# Patient Record
Sex: Male | Born: 1940 | Race: White | Hispanic: No | Marital: Married | Smoking: Former smoker
Health system: Southern US, Community
[De-identification: ages and names within clinical notes are randomized; demographics above are authoritative.]

## PROBLEM LIST (undated history)

## (undated) DIAGNOSIS — I272 Pulmonary hypertension, unspecified: Secondary | ICD-10-CM

## (undated) DIAGNOSIS — K829 Disease of gallbladder, unspecified: Secondary | ICD-10-CM

## (undated) DIAGNOSIS — H3530 Unspecified macular degeneration: Secondary | ICD-10-CM

## (undated) DIAGNOSIS — K449 Diaphragmatic hernia without obstruction or gangrene: Secondary | ICD-10-CM

## (undated) DIAGNOSIS — R413 Other amnesia: Secondary | ICD-10-CM

## (undated) DIAGNOSIS — E785 Hyperlipidemia, unspecified: Secondary | ICD-10-CM

## (undated) DIAGNOSIS — N400 Enlarged prostate without lower urinary tract symptoms: Secondary | ICD-10-CM

## (undated) DIAGNOSIS — K219 Gastro-esophageal reflux disease without esophagitis: Secondary | ICD-10-CM

## (undated) DIAGNOSIS — C4491 Basal cell carcinoma of skin, unspecified: Secondary | ICD-10-CM

## (undated) DIAGNOSIS — G459 Transient cerebral ischemic attack, unspecified: Secondary | ICD-10-CM

## (undated) HISTORY — DX: Basal cell carcinoma of skin, unspecified: C44.91

## (undated) HISTORY — DX: Transient cerebral ischemic attack, unspecified: G45.9

## (undated) HISTORY — PX: CHOLECYSTECTOMY: SHX55

## (undated) HISTORY — PX: OTHER SURGICAL HISTORY: SHX169

## (undated) HISTORY — DX: Gastro-esophageal reflux disease without esophagitis: K21.9

## (undated) HISTORY — DX: Other amnesia: R41.3

## (undated) HISTORY — DX: Pulmonary hypertension, unspecified: I27.20

## (undated) HISTORY — DX: Disease of gallbladder, unspecified: K82.9

## (undated) HISTORY — DX: Hyperlipidemia, unspecified: E78.5

## (undated) HISTORY — DX: Diaphragmatic hernia without obstruction or gangrene: K44.9

## (undated) HISTORY — DX: Benign prostatic hyperplasia without lower urinary tract symptoms: N40.0

---

## 1998-03-11 ENCOUNTER — Ambulatory Visit (HOSPITAL_COMMUNITY): Admission: RE | Admit: 1998-03-11 | Discharge: 1998-03-11 | Payer: Self-pay | Admitting: Gastroenterology

## 1998-05-01 ENCOUNTER — Ambulatory Visit (HOSPITAL_COMMUNITY): Admission: RE | Admit: 1998-05-01 | Discharge: 1998-05-01 | Payer: Self-pay | Admitting: *Deleted

## 1998-05-14 ENCOUNTER — Inpatient Hospital Stay (HOSPITAL_COMMUNITY): Admission: RE | Admit: 1998-05-14 | Discharge: 1998-05-15 | Payer: Self-pay | Admitting: *Deleted

## 1999-06-05 ENCOUNTER — Emergency Department (HOSPITAL_COMMUNITY): Admission: EM | Admit: 1999-06-05 | Discharge: 1999-06-05 | Payer: Self-pay

## 1999-06-05 ENCOUNTER — Encounter: Payer: Self-pay | Admitting: Emergency Medicine

## 2000-10-08 ENCOUNTER — Ambulatory Visit (HOSPITAL_COMMUNITY): Admission: RE | Admit: 2000-10-08 | Discharge: 2000-10-08 | Payer: Self-pay | Admitting: Gastroenterology

## 2002-05-27 ENCOUNTER — Emergency Department (HOSPITAL_COMMUNITY): Admission: EM | Admit: 2002-05-27 | Discharge: 2002-05-27 | Payer: Self-pay | Admitting: Emergency Medicine

## 2002-05-27 ENCOUNTER — Encounter: Payer: Self-pay | Admitting: Emergency Medicine

## 2002-11-10 ENCOUNTER — Ambulatory Visit (HOSPITAL_COMMUNITY): Admission: RE | Admit: 2002-11-10 | Discharge: 2002-11-10 | Payer: Self-pay | Admitting: Gastroenterology

## 2003-06-29 ENCOUNTER — Ambulatory Visit: Admission: RE | Admit: 2003-06-29 | Discharge: 2003-06-29 | Payer: Self-pay | Admitting: Internal Medicine

## 2004-10-20 ENCOUNTER — Ambulatory Visit (HOSPITAL_COMMUNITY): Admission: RE | Admit: 2004-10-20 | Discharge: 2004-10-20 | Payer: Self-pay | Admitting: Gastroenterology

## 2009-10-24 DIAGNOSIS — J309 Allergic rhinitis, unspecified: Secondary | ICD-10-CM

## 2009-10-24 DIAGNOSIS — K2270 Barrett's esophagus without dysplasia: Secondary | ICD-10-CM | POA: Insufficient documentation

## 2009-10-24 DIAGNOSIS — F09 Unspecified mental disorder due to known physiological condition: Secondary | ICD-10-CM

## 2009-10-24 DIAGNOSIS — N401 Enlarged prostate with lower urinary tract symptoms: Secondary | ICD-10-CM

## 2009-10-24 DIAGNOSIS — N400 Enlarged prostate without lower urinary tract symptoms: Secondary | ICD-10-CM | POA: Insufficient documentation

## 2009-10-24 DIAGNOSIS — Z8673 Personal history of transient ischemic attack (TIA), and cerebral infarction without residual deficits: Secondary | ICD-10-CM | POA: Insufficient documentation

## 2009-10-24 DIAGNOSIS — G459 Transient cerebral ischemic attack, unspecified: Secondary | ICD-10-CM

## 2009-10-24 DIAGNOSIS — E785 Hyperlipidemia, unspecified: Secondary | ICD-10-CM | POA: Insufficient documentation

## 2009-10-24 HISTORY — DX: Unspecified mental disorder due to known physiological condition: F09

## 2009-10-24 HISTORY — DX: Hyperlipidemia, unspecified: E78.5

## 2009-10-24 HISTORY — DX: Benign prostatic hyperplasia with lower urinary tract symptoms: N40.1

## 2009-10-24 HISTORY — DX: Transient cerebral ischemic attack, unspecified: G45.9

## 2009-10-24 HISTORY — DX: Personal history of transient ischemic attack (TIA), and cerebral infarction without residual deficits: Z86.73

## 2009-10-24 HISTORY — DX: Allergic rhinitis, unspecified: J30.9

## 2011-05-21 ENCOUNTER — Other Ambulatory Visit: Payer: Self-pay | Admitting: Gastroenterology

## 2012-05-10 DIAGNOSIS — G4700 Insomnia, unspecified: Secondary | ICD-10-CM | POA: Insufficient documentation

## 2013-01-20 ENCOUNTER — Other Ambulatory Visit: Payer: Self-pay | Admitting: Gastroenterology

## 2013-02-06 ENCOUNTER — Other Ambulatory Visit: Payer: Self-pay

## 2013-02-06 MED ORDER — DONEPEZIL HCL 10 MG PO TABS: 10.0000 mg | ORAL_TABLET | Freq: Every day | ORAL | Status: DC

## 2013-02-06 NOTE — Telephone Encounter (Signed)
Patient called, left message.  Stated he has changed his local pharmacy to [facility] on [address] and now gets 90 day Rx's through [employer] order.  He would like a refill on Aricept sent to mail order for 3 months and a small supply sent to the local pharmacy while he waits for meds to arrive in the mail.  Rx's have been sent.

## 2013-02-08 ENCOUNTER — Other Ambulatory Visit: Payer: Self-pay | Admitting: Neurology

## 2013-05-02 ENCOUNTER — Ambulatory Visit: Payer: Self-pay | Admitting: Nurse Practitioner

## 2013-05-03 ENCOUNTER — Encounter: Payer: Self-pay | Admitting: Nurse Practitioner

## 2013-05-09 ENCOUNTER — Encounter: Payer: Self-pay | Admitting: Nurse Practitioner

## 2013-05-09 ENCOUNTER — Ambulatory Visit (INDEPENDENT_AMBULATORY_CARE_PROVIDER_SITE_OTHER): Payer: Medicare Other | Admitting: Nurse Practitioner

## 2013-05-09 VITALS — BP 124/77 | HR 50 | Ht 65.0 in | Wt 161.0 lb

## 2013-05-09 DIAGNOSIS — R413 Other amnesia: Secondary | ICD-10-CM

## 2013-05-09 DIAGNOSIS — F411 Generalized anxiety disorder: Secondary | ICD-10-CM

## 2013-05-09 DIAGNOSIS — G4700 Insomnia, unspecified: Secondary | ICD-10-CM

## 2013-05-09 HISTORY — DX: Other amnesia: R41.3

## 2013-05-09 HISTORY — DX: Insomnia, unspecified: G47.00

## 2013-05-09 MED ORDER — TRAZODONE HCL 100 MG PO TABS: ORAL_TABLET | ORAL | Status: DC

## 2013-05-09 MED ORDER — DONEPEZIL HCL 10 MG PO TABS: 10.0000 mg | ORAL_TABLET | Freq: Every day | ORAL | Status: DC

## 2013-05-09 MED ORDER — ZALEPLON 10 MG PO CAPS: 10.0000 mg | ORAL_CAPSULE | Freq: Every day | ORAL | Status: DC

## 2013-05-09 NOTE — Progress Notes (Signed)
I have read the note, and I agree with the clinical assessment and plan.  Frank Meyer,Frank Meyer   

## 2013-05-09 NOTE — Progress Notes (Signed)
[facility]  PATIENT: Frank Meyer DOB: 1941/01/22   REASON FOR VISIT: follow up HISTORY FROM: patient  HISTORY OF PRESENT ILLNESS: Chief Complaint: Memory disturbance  HPI: Frank Meyer is a 72 year old right-handed white male with a history of minimum cognitive impairment. The patient has not had a significant decline in his memory issues since last seen, but he does indicate that he has stopped doing a lot of household repairs that he used to feel comfortable with. The patient is still doing the finances, driving a car, and doing general housework. The patient reports ongoing problems with sleeping, and oftentimes his mind will race at night, and he will wake up once every hour. The patient takes Sonata on occasion. The patient returns for an evaluation.  UPDATE 05/09/13 (LL):  Frank Meyer returns for annual follow up for mild memory loss.  He states that he is doing well, still exercising at the [place] several days a weeks and golfs twice a week.  States that he averages 6 hours of sleep per night. He takes a sleep aid about every three nights because his mind races about what he has to do the next day.  He does not feel drowsy in the mornings when he takes it. He states that the Trazodone has helped his quality of sleep and his mood, he notices that he is not as irritable with his wife as he was before.  They are getting along much better.  REVIEW OF SYSTEMS: Full 14 system review of systems performed and notable only for:  Constitutional: N/A  Cardiovascular: N/A  Ear/Nose/Throat: N/A  Skin: N/A  Eyes: N/A  Respiratory: N/A  Gastroitestinal: N/A  Genitourinary: N/A Hematology/Lymphatic: N/A  Endocrine: N/A Musculoskeletal:N/A  Allergy/Immunology: N/A  Neurological: N/A Psychiatric: N/A Sleep: N/A   ALLERGIES: No Known Allergies  HOME MEDICATIONS: Outpatient Prescriptions Prior to Visit  Medication Sig Dispense Refill  . traZODone (DESYREL) 100 MG  tablet Take 1 tablet by mouth at  night  90 tablet  1  . donepezil (ARICEPT) 10 MG tablet Take 1 tablet (10 mg total) by mouth daily.  90 tablet  1   No facility-administered medications prior to visit.    PAST MEDICAL HISTORY: Past Medical History  Diagnosis Date  . Mild memory disturbance   . TIA (transient ischemic attack)   . Hiatal hernia   . Gallbladder problem   . Gastroesophageal reflux disease   . Benign prostatic hypertrophy   . Dyslipidemia   . Basal cell carcinoma     PAST SURGICAL HISTORY: Past Surgical History  Procedure Laterality Date  . None      FAMILY HISTORY: Family History  Problem Relation Age of Onset  . Obesity Mother   . Memory loss Mother     SOCIAL HISTORY: History   Social History  . Marital Status: Married    Spouse Name: N/A    Number of Children: 2  . Years of Education: college   Occupational History  . retired    Social History Main Topics  . Smoking status: Former Smoker    Quit date: 05/04/1979  . Smokeless tobacco: Not on file  . Alcohol Use: Yes  . Drug Use: No  . Sexual Activity: Not on file   Other Topics Concern  . Not on file   Social History Narrative   Pt is retired. He is married. The pt lives with his wife. He has a Chiropodist. The pt has 2 children.  PHYSICAL EXAM  Filed Vitals:   05/09/13 1109  BP: 124/77  Pulse: 50  Height: 5\' 5"  (1.651 m)  Weight: 161 lb (73.029 kg)   Body mass index is 26.79 kg/(m^2).  Generalized: Well developed, athletic build, in no acute distress  Head: normocephalic and atraumatic. Oropharynx benign  Neck: Supple, no carotid bruits  Cardiac: Regular rate rhythm, no murmur  Musculoskeletal: No deformity   Neurologic Exam  Mental Status: Alert and Oriented x 2.  Mini-Mental status examination done today shows score of 28 out of 30 with deficits in date and day of week. The patient was able to name 13 animals in 60 seconds (normal). Clock drawing 4/4. Cranial  Nerves: Facial symmetry is present.  Speech is normal, no aphasia or dysarthria is noted.  Extraocular movements are full.  Visual fields are full.  Symmetric shoulder shrug. Motor: Patient has good strength in all 4 extremities. No drift is seen.   Coordination: Patient has good finger-to-nose and heel-to-shin bilaterally. Some apraxia with the use of the extremities is noted. Gait and Station: Patient has a normal gait.  Tandem gait is normal.  Romberg is negative.  Reflexes: Deep tendon reflexes are symmetric. moderate stride, good arm swing, smooth turning, able to perform tiptoe, and heel walking without difficulty.   DIAGNOSTIC DATA (LABS, IMAGING, TESTING) - I reviewed patient records, labs, notes, testing and imaging myself where available.  ASSESSMENT AND PLAN 72 y.o. year old Caucasian male has a past medical history of Mild memory disturbance; TIA (transient ischemic attack); Hiatal hernia; Gallbladder problem; Gastroesophageal reflux disease; Benign prostatic hypertrophy; Dyslipidemia; and Basal cell carcinoma here for follow up of memory loss. The patient continues to score well on the Mini-Mental status examination, 28/30.    PLAN: Continue Aricept for memory loss.. Continue Trazodone for sleep and mood. Continue Sonata prn for insomnia. Followup in one year.  Ronal Fear, MSN, NP-C 05/09/2013, 11:44 AM [facility] [address] [place], [place] [address] [phone]

## 2013-06-20 ENCOUNTER — Other Ambulatory Visit: Payer: Self-pay | Admitting: Gastroenterology

## 2013-06-21 ENCOUNTER — Other Ambulatory Visit: Payer: Self-pay | Admitting: Neurology

## 2013-06-21 MED ORDER — ZALEPLON 10 MG PO CAPS: 10.0000 mg | ORAL_CAPSULE | Freq: Every day | ORAL | Status: DC

## 2013-06-21 NOTE — Telephone Encounter (Signed)
Pt's prescription was faxed to [facility] at [phone].

## 2013-06-22 ENCOUNTER — Other Ambulatory Visit: Payer: Self-pay

## 2013-10-16 DIAGNOSIS — L84 Corns and callosities: Secondary | ICD-10-CM | POA: Insufficient documentation

## 2013-10-16 HISTORY — DX: Corns and callosities: L84

## 2013-11-01 ENCOUNTER — Ambulatory Visit (INDEPENDENT_AMBULATORY_CARE_PROVIDER_SITE_OTHER): Payer: Medicare Other | Admitting: Podiatry

## 2013-11-01 ENCOUNTER — Ambulatory Visit (INDEPENDENT_AMBULATORY_CARE_PROVIDER_SITE_OTHER): Payer: Medicare Other

## 2013-11-01 ENCOUNTER — Encounter: Payer: Self-pay | Admitting: Podiatry

## 2013-11-01 VITALS — BP 152/82 | HR 54 | Resp 16 | Ht 67.0 in | Wt 160.0 lb

## 2013-11-01 DIAGNOSIS — M79673 Pain in unspecified foot: Secondary | ICD-10-CM

## 2013-11-01 DIAGNOSIS — M21619 Bunion of unspecified foot: Secondary | ICD-10-CM

## 2013-11-01 DIAGNOSIS — M779 Enthesopathy, unspecified: Secondary | ICD-10-CM

## 2013-11-01 DIAGNOSIS — M79609 Pain in unspecified limb: Secondary | ICD-10-CM

## 2013-11-01 MED ORDER — DEXAMETHASONE SODIUM PHOSPHATE 120 MG/30ML IJ SOLN
2.0000 mg | Freq: Once | INTRAMUSCULAR | Status: AC
  Administered 2013-11-01: 2 mg via INTRA_ARTICULAR

## 2013-11-01 NOTE — Progress Notes (Signed)
   Subjective:    Patient ID: Frank Meyer, male    DOB: 08-31-1940, 73 y.o.   MRN: [id number]  HPI Comments: "I have a bad spot on my right foot"  Patient c/o aching 5th MPJ right foot since Dec. 2014. It has worsened in the last 3-4 weeks. The area is red, swollen and callused. Patient is very active in the gym and walking his dog daily. He has been padding and using a silicone shield.   Foot Pain      Review of Systems  HENT: Positive for sinus pressure.   Gastrointestinal: Positive for diarrhea.  All other systems reviewed and are negative.       Objective:   Physical Exam  Orientated x52 73 year old white male  Vascular: DP and PT pulses 2/4 bilaterally  Neurological: Ankle reflexes reactive bilaterally  Dermatological: Low-grade erythema noted over the lateral fifth right MPJ. A punctate keratoses noted over the lateral fifth right MPJ. 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 The lateral fifth right MPJ is tender to lateral pressure.  X-ray report weightbearing right foot  Taylor's bunion noted. Small inferior calcaneal spur present. No fractures and or dislocations noted.  Radiographic impression: Taylor's bunion deformity right No acute bony abnormality noted.      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  Plan: A punctate keratoses was debrided. The skin overlying the fifth right MPJ was prepped with alcohol and Betadine. 2 mg of dexamethasone phosphate mixed with 0.5% plain Marcaine was injected about the lateral fifth right MPJ area.  Patient advised to wear a soft shoe overlying the fifth right MPJ area and continue wearing a protective silicone shield over the fifth right MPJ area.  Reappoint at patient's  request.

## 2013-11-02 ENCOUNTER — Encounter: Payer: Self-pay | Admitting: Podiatry

## 2013-11-06 ENCOUNTER — Ambulatory Visit: Payer: Medicare Other | Admitting: Podiatry

## 2013-11-06 ENCOUNTER — Other Ambulatory Visit: Payer: Self-pay | Admitting: Dermatology

## 2014-01-02 ENCOUNTER — Other Ambulatory Visit: Payer: Self-pay

## 2014-04-09 ENCOUNTER — Other Ambulatory Visit: Payer: Self-pay | Admitting: Nurse Practitioner

## 2014-05-08 ENCOUNTER — Encounter: Payer: Self-pay | Admitting: Nurse Practitioner

## 2014-05-08 ENCOUNTER — Ambulatory Visit (INDEPENDENT_AMBULATORY_CARE_PROVIDER_SITE_OTHER): Payer: Medicare Other | Admitting: Nurse Practitioner

## 2014-05-08 VITALS — BP 115/61 | HR 54 | Ht 66.5 in | Wt 162.8 lb

## 2014-05-08 DIAGNOSIS — G4700 Insomnia, unspecified: Secondary | ICD-10-CM

## 2014-05-08 DIAGNOSIS — R413 Other amnesia: Secondary | ICD-10-CM

## 2014-05-08 MED ORDER — TRAZODONE HCL 100 MG PO TABS: ORAL_TABLET | ORAL | Status: DC

## 2014-05-08 MED ORDER — ZALEPLON 10 MG PO CAPS: 10.0000 mg | ORAL_CAPSULE | Freq: Every day | ORAL | Status: DC

## 2014-05-08 MED ORDER — DONEPEZIL HCL 10 MG PO TABS: ORAL_TABLET | ORAL | Status: DC

## 2014-05-08 NOTE — Progress Notes (Signed)
PATIENT: Frank Meyer DOB: [birth year]  HISTORY FROM: patient Chief Complaint: Memory disturbance   HPI: Frank Meyer is a 73 year old right-handed white male with a history of mild cognitive impairment. The patient has not had a significant decline in his memory issues since last seen. The patient is still doing the finances, driving a car, and doing general housework and repairs. The patient reports ongoing problems with sleeping, and oftentimes his mind will race at night, and he will wake up during thee night.  This has improved since starting on Trazodine over a year ago. He notices that he is not as irritable with his wife as he was before. The patient takes Sonata on occasion.  He does not feel drowsy in the mornings when he takes it. He has continued to golf at least twice weekly with two clubs, one near his home in [place] and another that travels the local area.  The patient returns for an evaluation. MMSE is 29/30 today, improved from 28/30 last year.  Geriatric depression score is 1 only. Clock drawing is 4/4 and animal fluency test is 14 in one minute.  REVIEW OF SYSTEMS: Full 14 system review of systems performed and notable only for:  Runny nose, frequent waking, memory loss, anxiety  ALLERGIES: No Known Allergies  HOME MEDICATIONS: Outpatient Prescriptions Prior to Visit  Medication Sig Dispense Refill  . aspirin 81 MG tablet Take 81 mg by mouth daily.      Marland Kitchen atorvastatin (LIPITOR) 20 MG tablet Take 20 mg by mouth daily.      . cholecalciferol (VITAMIN D) 1000 UNITS tablet Take 1,000 Units by mouth daily.      Marland Kitchen dutasteride (AVODART) 0.5 MG capsule Take 0.5 mg by mouth daily.      Marland Kitchen ipratropium (ATROVENT) 0.06 % nasal spray       . MICRONIZED COLESTIPOL HCL 1 G tablet       . omeprazole (PRILOSEC) 40 MG capsule       . Probiotic Product (ALIGN) 4 MG CAPS Take 4 mg by mouth daily.      Marland Kitchen donepezil (ARICEPT) 10 MG tablet Take 1 tablet by mouth  daily  90 tablet  0  .  traZODone (DESYREL) 100 MG tablet Take 1 tablet by mouth at  night  90 tablet  3  . zaleplon (SONATA) 10 MG capsule Take 1 capsule (10 mg total) by mouth at bedtime.  30 capsule  5   . betamethasone dipropionate (DIPROLENE) 0.05 % cream    Sig:   . B Complex-C-Folic Acid (STRESS FORMULA PO)    Sig: Take by mouth daily.   PHYSICAL EXAM Filed Vitals:   05/08/14 1016  BP: 115/61  Pulse: 54  Height: 5' 6.5" (1.689 m)  Weight: 162 lb 12.8 oz (73.846 kg)   Body mass index is 25.89 kg/(m^2).  MMSE - Mini Mental State Exam 05/08/2014  Orientation to time 5  Orientation to Place 4  Registration 3  Attention/ Calculation 5  Recall 3  Language- name 2 objects 2  Language- repeat 1  Language- follow 3 step command 3  Language- read & follow direction 1  Write a sentence 1  Copy design 1  Total score 29   Generalized: Well developed, athletic build, in no acute distress  Head: normocephalic and atraumatic. Oropharynx benign  Neck: Supple, no carotid bruits  Cardiac: Regular rate rhythm, no murmur  Musculoskeletal: No deformity   Neurologic Exam  Mental Status: Alert and Oriented  x 3.   Cranial Nerves: Facial symmetry is present. Speech is normal, no aphasia or dysarthria is noted. Extraocular movements are full. Visual fields are full. Symmetric shoulder shrug.  Motor: Patient has good strength in all 4 extremities. No drift is seen.  Coordination: Patient has good finger-to-nose and heel-to-shin bilaterally. Some apraxia with the use of the extremities is noted.  Gait and Station: Patient has a normal gait. Tandem gait is normal. Romberg is negative.  Reflexes: Deep tendon reflexes are symmetric. moderate stride, good arm swing, smooth turning, able to perform tiptoe, and heel walking without difficulty.   ASSESSMENT: 73 y.o. year old Caucasian male has a past medical history of Mild memory disturbance; TIA (transient ischemic attack); Hiatal hernia; Gallbladder problem;  Gastroesophageal reflux disease; Benign prostatic hypertrophy; Dyslipidemia; and Basal cell carcinoma here for follow up of memory loss. The patient continues to score well on the Mini-Mental status examination, 29/30.   PLAN:  Continue Aricept for memory loss.  Continue Trazodone for sleep and mood.  Continue Sonata prn for insomnia. Patient was advised to not use every night, only when necessary. Follow up in 1 year with Dr. Jannifer Franklin, sooner as needed.  Meds ordered this encounter  Medications  . donepezil (ARICEPT) 10 MG tablet    Sig: Take 1 tablet by mouth  daily    Dispense:  90 tablet    Refill:  4    Order Specific Question:  Supervising Provider    Answer:  Margette Fast K [[id number]]  . traZODone (DESYREL) 100 MG tablet    Sig: Take 1 tablet by mouth at  night    Dispense:  90 tablet    Refill:  4    Order Specific Question:  Supervising Provider    Answer:  Margette Fast K [[id number]]  . zaleplon (SONATA) 10 MG capsule    Sig: Take 1 capsule (10 mg total) by mouth at bedtime.    Dispense:  30 capsule    Refill:  5    [facility] fax [phone].    Order Specific Question:  Supervising Provider    Answer:  Kathrynn Ducking [[id number]]   Return in about 1 year (around 05/09/2015) for memory, insomnia.  Rudi Rummage Cilicia Borden, MSN, FNP-BC, A/GNP-C 05/08/2014, 11:03 AM [facility] [address] [place], [place] [address] [phone]  Note: This document was prepared with digital dictation and possible smart phrase technology. Any transcriptional errors that result from this process are unintentional.

## 2014-05-08 NOTE — Patient Instructions (Signed)
PLAN:  Continue Aricept for memory loss.  Continue Trazodone for sleep and mood.  Continue Sonata prn for insomnia. Follow up in 1 year with Dr. Jannifer Franklin, sooner as needed.

## 2014-05-08 NOTE — Progress Notes (Signed)
I have read the note, and I agree with the clinical assessment and plan.  [name],CHARLES KEITH   

## 2015-01-17 DIAGNOSIS — Z0000 Encounter for general adult medical examination without abnormal findings: Secondary | ICD-10-CM | POA: Insufficient documentation

## 2015-01-17 HISTORY — DX: Encounter for general adult medical examination without abnormal findings: Z00.00

## 2015-01-23 DIAGNOSIS — I517 Cardiomegaly: Secondary | ICD-10-CM | POA: Insufficient documentation

## 2015-01-23 DIAGNOSIS — I519 Heart disease, unspecified: Secondary | ICD-10-CM | POA: Insufficient documentation

## 2015-01-23 HISTORY — DX: Cardiomegaly: I51.7

## 2015-02-11 ENCOUNTER — Other Ambulatory Visit: Payer: Self-pay

## 2015-05-07 ENCOUNTER — Encounter: Payer: Self-pay | Admitting: Neurology

## 2015-05-07 ENCOUNTER — Ambulatory Visit (INDEPENDENT_AMBULATORY_CARE_PROVIDER_SITE_OTHER): Payer: Medicare Other | Admitting: Neurology

## 2015-05-07 VITALS — BP 124/73 | HR 53 | Ht 67.0 in | Wt 167.5 lb

## 2015-05-07 DIAGNOSIS — R413 Other amnesia: Secondary | ICD-10-CM

## 2015-05-07 MED ORDER — ZALEPLON 10 MG PO CAPS: 10.0000 mg | ORAL_CAPSULE | Freq: Every day | ORAL | Status: DC

## 2015-05-07 NOTE — Patient Instructions (Signed)
Try going up on the trazodone to 150 or 200 mg at night. If this is effective, call for a refill at this dose.   Insomnia Insomnia is frequent trouble falling and/or staying asleep. Insomnia can be a long term problem or a short term problem. Both are common. Insomnia can be a short term problem when the wakefulness is related to a certain stress or worry. Long term insomnia is often related to ongoing stress during waking hours and/or poor sleeping habits. Overtime, sleep deprivation itself can make the problem worse. Every little thing feels more severe because you are overtired and your ability to cope is decreased. CAUSES   Stress, anxiety, and depression.  Poor sleeping habits.  Distractions such as TV in the bedroom.  Naps close to bedtime.  Engaging in emotionally charged conversations before bed.  Technical reading before sleep.  Alcohol and other sedatives. They may make the problem worse. They can hurt normal sleep patterns and normal dream activity.  Stimulants such as caffeine for several hours prior to bedtime.  Pain syndromes and shortness of breath can cause insomnia.  Exercise late at night.  Changing time zones may cause sleeping problems (jet lag). It is sometimes helpful to have someone observe your sleeping patterns. They should look for periods of not breathing during the night (sleep apnea). They should also look to see how long those periods last. If you live alone or observers are uncertain, you can also be observed at a sleep clinic where your sleep patterns will be professionally monitored. Sleep apnea requires a checkup and treatment. Give your caregivers your medical history. Give your caregivers observations your family has made about your sleep.  SYMPTOMS   Not feeling rested in the morning.  Anxiety and restlessness at bedtime.  Difficulty falling and staying asleep. TREATMENT   Your caregiver may prescribe treatment for an underlying medical  disorders. Your caregiver can give advice or help if you are using alcohol or other drugs for self-medication. Treatment of underlying problems will usually eliminate insomnia problems.  Medications can be prescribed for short time use. They are generally not recommended for lengthy use.  Over-the-counter sleep medicines are not recommended for lengthy use. They can be habit forming.  You can promote easier sleeping by making lifestyle changes such as:  Using relaxation techniques that help with breathing and reduce muscle tension.  Exercising earlier in the day.  Changing your diet and the time of your last meal. No night time snacks.  Establish a regular time to go to bed.  Counseling can help with stressful problems and worry.  Soothing music and white noise may be helpful if there are background noises you cannot remove.  Stop tedious detailed work at least one hour before bedtime. HOME CARE INSTRUCTIONS   Keep a diary. Inform your caregiver about your progress. This includes any medication side effects. See your caregiver regularly. Take note of:  Times when you are asleep.  Times when you are awake during the night.  The quality of your sleep.  How you feel the next day. This information will help your caregiver care for you.  Get out of bed if you are still awake after 15 minutes. Read or do some quiet activity. Keep the lights down. Wait until you feel sleepy and go back to bed.  Keep regular sleeping and waking hours. Avoid naps.  Exercise regularly.  Avoid distractions at bedtime. Distractions include watching television or engaging in any intense or detailed activity  like attempting to balance the household checkbook.  Develop a bedtime ritual. Keep a familiar routine of bathing, brushing your teeth, climbing into bed at the same time each night, listening to soothing music. Routines increase the success of falling to sleep faster.  Use relaxation techniques.  This can be using breathing and muscle tension release routines. It can also include visualizing peaceful scenes. You can also help control troubling or intruding thoughts by keeping your mind occupied with boring or repetitive thoughts like the old concept of counting sheep. You can make it more creative like imagining planting one beautiful flower after another in your backyard garden.  During your day, work to eliminate stress. When this is not possible use some of the previous suggestions to help reduce the anxiety that accompanies stressful situations. MAKE SURE YOU:   Understand these instructions.  Will watch your condition.  Will get help right away if you are not doing well or get worse. Document Released: 07/31/2000 Document Revised: 10/26/2011 Document Reviewed: 08/31/2007 [facility] Patient Information 2015 [place], [place]. This information is not intended to replace advice given to you by your health care provider. Make sure you discuss any questions you have with your health care provider.

## 2015-05-07 NOTE — Progress Notes (Signed)
Reason for visit: Memory disturbance  Frank Meyer is an 74 y.o. male  History of present illness:  Frank Meyer is a 74 year old right-handed white male with a history of a progressive memory disturbance. The patient has some short-term and long-term memory issues, he has difficulty remembering events that occurred years ago, as well as more recent events. He has difficulty with names and faces of people. He is still operating a motor vehicle, but he uses GPS to do this. He is on Aricept taking 10 mg night, in general tolerating this fairly well. He does have some problems with a runny nose on the medication. He is having ongoing issues with insomnia, he is on trazodone, but he has not taken over 100 mg at night for sleep. He will take Sonata at times for sleeping at night. He returns to this office for an evaluation. He has been given a trial on Belsomra in low dose at 10 mg, but this was not effective for sleep.  Past Medical History  Diagnosis Date  . Mild memory disturbance   . TIA (transient ischemic attack)   . Hiatal hernia   . Gallbladder problem   . Gastroesophageal reflux disease   . Benign prostatic hypertrophy   . Dyslipidemia   . Basal cell carcinoma     Past Surgical History  Procedure Laterality Date  . None      Family History  Problem Relation Age of Onset  . Obesity Mother   . Memory loss Mother     Social history:  reports that he quit smoking about 36 years ago. He has never used smokeless tobacco. He reports that he drinks about 9.0 - 12.0 oz of alcohol per week. He reports that he does not use illicit drugs.   No Known Allergies  Medications:  Prior to Admission medications   Medication Sig Start Date End Date Taking? Authorizing Provider  aspirin 81 MG tablet Take 162 mg by mouth daily.    Yes Historical Provider, MD  atorvastatin (LIPITOR) 20 MG tablet Take 20 mg by mouth daily.   Yes Historical Provider, MD  B Complex-C-Folic Acid (STRESS  FORMULA PO) Take by mouth daily.   Yes Historical Provider, MD  betamethasone dipropionate (DIPROLENE) 0.05 % cream  04/24/14  Yes Historical Provider, MD  cholecalciferol (VITAMIN D) 1000 UNITS tablet Take 1,000 Units by mouth daily.   Yes Historical Provider, MD  donepezil (ARICEPT) 10 MG tablet Take 1 tablet by mouth  daily 05/08/14  Yes Philmore Pali, NP  finasteride (PROSCAR) 5 MG tablet Take 5 mg by mouth daily.   Yes Historical Provider, MD  ipratropium (ATROVENT) 0.06 % nasal spray  02/09/13  Yes Historical Provider, MD  omeprazole (PRILOSEC) 40 MG capsule  03/29/13  Yes Historical Provider, MD  traZODone (DESYREL) 100 MG tablet Take 1 tablet by mouth at  night 05/08/14  Yes Philmore Pali, NP  zaleplon (SONATA) 10 MG capsule Take 1 capsule (10 mg total) by mouth at bedtime. 05/08/14  Yes Philmore Pali, NP    ROS:  Out of a complete 14 system review of symptoms, the patient complains only of the following symptoms, and all other reviewed systems are negative.  Decreased activity level Frequent waking Memory loss  Blood pressure 124/73, pulse 53, height 5\' 7"  (1.702 m), weight 167 lb 8 oz (75.978 kg).  Physical Exam  General: The patient is alert and cooperative at the time of the examination.  Skin: No  significant peripheral edema is noted.   Neurologic Exam  Mental status: The patient is alert and oriented x 2 at the time of the examination, not oriented to date. The patient has apparent normal recent and remote memory, with an apparently normal attention span and concentration ability. Mini-Mental Status Examination done today shows a total score of 29/30. The patient is able to name 16 animals in 30 seconds.   Cranial nerves: Facial symmetry is present. Speech is normal, no aphasia or dysarthria is noted. Extraocular movements are full. Visual fields are full.  Motor: The patient has good strength in all 4 extremities.  Sensory examination: Soft touch sensation is symmetric on the  face, arms, and legs.  Coordination: The patient has good finger-nose-finger and heel-to-shin bilaterally.  Gait and station: The patient has a normal gait. Tandem gait is normal. Romberg is negative. No drift is seen.  Reflexes: Deep tendon reflexes are symmetric.   Assessment/Plan:  1. Mild memory disturbance  2. Chronic insomnia  The patient is doing fairly well at this time on the Aricept. He continues to have issues with insomnia. I have asked him to increase his trazodone dose to 150 or 200 mg at night. He will call me if this is effective. He will otherwise follow-up in one year.  Jill Alexanders MD 05/07/2015 8:19 PM  [facility] [address] [place], [place] [address]  Phone [phone] Fax [phone]

## 2015-06-05 ENCOUNTER — Telehealth: Payer: Self-pay | Admitting: Neurology

## 2015-06-05 MED ORDER — TRAZODONE HCL 100 MG PO TABS: 200.0000 mg | ORAL_TABLET | Freq: Every day | ORAL | Status: DC

## 2015-06-05 MED ORDER — DONEPEZIL HCL 10 MG PO TABS: ORAL_TABLET | ORAL | Status: DC

## 2015-06-05 NOTE — Telephone Encounter (Signed)
Last OV note says: I have asked him to increase his trazodone dose to 150 or 200 mg at night. He will call me if this is effective Rx has been updated and sent along with Aricept.  Receipt confirmed by pharmacy.  I called back to advise.  He is aware.

## 2015-06-05 NOTE — Telephone Encounter (Signed)
Pt called and states that Dr. Jannifer Franklin said he could in crease traZODone (DESYREL) 100 MG tablet to 200 mg. 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 He is requesting a new Rx that will be for 200 mg instead. May call  [phone]. He also needs refill on donepezil (ARICEPT) 10 MG tablet. Please send to [facility]. Thank you

## 2015-11-04 ENCOUNTER — Other Ambulatory Visit: Payer: Self-pay | Admitting: Gastroenterology

## 2016-05-06 ENCOUNTER — Ambulatory Visit (INDEPENDENT_AMBULATORY_CARE_PROVIDER_SITE_OTHER): Payer: Medicare Other | Admitting: Adult Health

## 2016-05-06 ENCOUNTER — Encounter: Payer: Self-pay | Admitting: Adult Health

## 2016-05-06 ENCOUNTER — Telehealth: Payer: Self-pay | Admitting: Neurology

## 2016-05-06 VITALS — BP 118/69 | HR 58 | Ht 67.0 in | Wt 170.4 lb

## 2016-05-06 DIAGNOSIS — G4700 Insomnia, unspecified: Secondary | ICD-10-CM | POA: Diagnosis not present

## 2016-05-06 DIAGNOSIS — R413 Other amnesia: Secondary | ICD-10-CM | POA: Diagnosis not present

## 2016-05-06 MED ORDER — ZALEPLON 10 MG PO CAPS: 10.0000 mg | ORAL_CAPSULE | Freq: Every evening | ORAL | 0 refills | Status: DC | PRN

## 2016-05-06 NOTE — Addendum Note (Signed)
Addended by: Trudie Buckler on: 05/06/2016 04:23 PM   Modules accepted: Orders

## 2016-05-06 NOTE — Telephone Encounter (Signed)
Prescription printed and given to [facility]. Please make patient aware.

## 2016-05-06 NOTE — Progress Notes (Signed)
PATIENT: Frank Meyer DOB: 09/14/1940  REASON FOR VISIT: follow up- memory disturbance, insomnia HISTORY FROM: patient  HISTORY OF PRESENT ILLNESS: Frank Meyer is a 75 year old male with a history of progressive memory disturbance and insomnia. He returns today for follow-up. He is currently on Aricept and tolerating it well. He denies any significant changes with his memory. He states that he plays golf 3 times a week. He states that he does have trouble with directions. He states that if he prints all 3 directions and uses his physical GPS he does fine. He lives at home with his wife. He is able to complete all ADLs independently. He reports that he is taking trazodone 150 mg at bedtime. He states that it does help some with his sleep but he does notice more benefit with his mood. He reports that he is able to handle his finances independently. He states on occasion he will have a "flood of emotions" and feels that he may pass out. He states this has been happening for years. His daughter feels as if he may be having a panic attack. He denies any new neurological symptoms. He returns today for an evaluation.   HISTORY 05/07/15 ([name]): Frank Meyer is a 75 year old right-handed white male with a history of a progressive memory disturbance. The patient has some short-term and long-term memory issues, he has difficulty remembering events that occurred years ago, as well as more recent events. He has difficulty with names and faces of people. He is still operating a motor vehicle, but he uses GPS to do this. He is on Aricept taking 10 mg night, in general tolerating this fairly well. He does have some problems with a runny nose on the medication. He is having ongoing issues with insomnia, he is on trazodone, but he has not taken over 100 mg at night for sleep. He will take Sonata at times for sleeping at night. He returns to this office for an evaluation. He has been given a trial on Belsomra in  low dose at 10 mg, but this was not effective for sleep  REVIEW OF SYSTEMS: Out of a complete 14 system review of symptoms, the patient complains only of the following symptoms, and all other reviewed systems are negative.  Insomnia, runny nose  ALLERGIES: No Known Allergies  HOME MEDICATIONS: Outpatient Medications Prior to Visit  Medication Sig Dispense Refill  . aspirin 81 MG tablet Take 162 mg by mouth daily.     Marland Kitchen atorvastatin (LIPITOR) 20 MG tablet Take 20 mg by mouth daily.    . betamethasone dipropionate (DIPROLENE) 0.05 % cream     . cholecalciferol (VITAMIN D) 1000 UNITS tablet Take 1,000 Units by mouth daily.    Marland Kitchen donepezil (ARICEPT) 10 MG tablet Take 1 tablet by mouth  daily 90 tablet 4  . finasteride (PROSCAR) 5 MG tablet Take 5 mg by mouth daily.    Marland Kitchen ipratropium (ATROVENT) 0.06 % nasal spray     . omeprazole (PRILOSEC) 40 MG capsule     . traZODone (DESYREL) 100 MG tablet Take 2 tablets (200 mg total) by mouth at bedtime. (Patient taking differently: Take 150 mg by mouth at bedtime. ) 180 tablet 4  . zaleplon (SONATA) 10 MG capsule Take 1 capsule (10 mg total) by mouth at bedtime. 90 capsule 0  . B Complex-C-Folic Acid (STRESS FORMULA PO) Take by mouth daily.     No facility-administered medications prior to visit.     PAST MEDICAL  HISTORY: Past Medical History:  Diagnosis Date  . Basal cell carcinoma   . Benign prostatic hypertrophy   . Dyslipidemia   . Gallbladder problem   . Gastroesophageal reflux disease   . Hiatal hernia   . Mild memory disturbance   . TIA (transient ischemic attack)     PAST SURGICAL HISTORY: Past Surgical History:  Procedure Laterality Date  . none      FAMILY HISTORY: Family History  Problem Relation Age of Onset  . Obesity Mother   . Memory loss Mother     SOCIAL HISTORY: Social History   Social History  . Marital status: Married    Spouse name: pamela  . Number of children: 2  . Years of education: college    Occupational History  . retired    Social History Main Topics  . Smoking status: Former Smoker    Quit date: 05/04/1979  . Smokeless tobacco: Never Used  . Alcohol use 9.0 - 12.0 oz/week    15 - 20 Cans of beer per week  . Drug use: No  . Sexual activity: Not on file   Other Topics Concern  . Not on file   Social History Narrative   Pt is retired. He is married. The pt lives with his wife. He has a Best boy. The pt has 2 children.      Patient drinks 2-3 cups of caffeine daily.   Patient is right handed.       PHYSICAL EXAM  Vitals:   05/06/16 0953  BP: 118/69  Pulse: (!) 58  Weight: 170 lb 6.4 oz (77.3 kg)  Height: 5\' 7"  (1.702 m)   Body mass index is 26.69 kg/m.   MMSE - Mini Mental State Exam 05/06/2016 05/07/2015 05/08/2014  Orientation to time 4 4 5   Orientation to Place 5 5 4   Registration 3 3 3   Attention/ Calculation 5 5 5   Recall 2 3 3   Language- name 2 objects 2 2 2   Language- repeat 1 1 1   Language- follow 3 step command 2 3 3   Language- read & follow direction 1 1 1   Write a sentence 1 1 1   Copy design 1 1 1   Total score 27 29 29    Generalized: Well developed, in no acute distress   Neurological examination  Mentation: Alert oriented to time, place, history taking. Follows all commands speech and language fluent Cranial nerve II-XII: Pupils were equal round reactive to light. Extraocular movements were full, visual field were full on confrontational test. Facial sensation and strength were normal. Uvula tongue midline. Head turning and shoulder shrug  were normal and symmetric. Motor: The motor testing reveals 5 over 5 strength of all 4 extremities. Good symmetric motor tone is noted throughout.  Sensory: Sensory testing is intact to soft touch on all 4 extremities. No evidence of extinction is noted.  Coordination: Cerebellar testing reveals good finger-nose-finger and heel-to-shin bilaterally.  Gait and station: Gait is normal. Tandem  gait is normal. Romberg is negative. No drift is seen.  Reflexes: Deep tendon reflexes are symmetric and normal bilaterally.   DIAGNOSTIC DATA (LABS, IMAGING, TESTING) - I reviewed patient records, labs, notes, testing and imaging myself where available.     ASSESSMENT AND PLAN 75 y.o. year old male  has a past medical history of Basal cell carcinoma; Benign prostatic hypertrophy; Dyslipidemia; Gallbladder problem; Gastroesophageal reflux disease; Hiatal hernia; Mild memory disturbance; and TIA (transient ischemic attack). here with:  1. Memory disturbance 2. Insomnia  The patient's memory score has slightly declined since the last visit. He will remain on Aricept 10 mg at bedtime. We will continue to monitor his memory. Patient will continue using trazodone 150 mg at bedtime for sleep. Advised that if his symptoms worsen or he develops any new symptoms he should let us know. Will follow-up in one year with Dr. Jannifer Franklin.     Ward Givens, MSN, NP-C 05/06/2016, 10:12 AM [facility] [address] [place], [place] [address] [phone]

## 2016-05-06 NOTE — Progress Notes (Signed)
I have read the note, and I agree with the clinical assessment and plan.  Frank Meyer KEITH   

## 2016-05-06 NOTE — Telephone Encounter (Signed)
Pt saw Jinny Blossom today and will see Dr. Jannifer Franklin in 1 year.  He is requesting a refill for his Sonata.  He stated that his insurance will only let him have 90 pills a year so he only takes one every 4 days or so.

## 2016-05-06 NOTE — Patient Instructions (Signed)
Memory score slightly decreased Continue trazodone 150 mg at bedtime If your symptoms worsen or you develop new symptoms please let us know.

## 2016-05-07 MED ORDER — ZALEPLON 10 MG PO CAPS: 10.0000 mg | ORAL_CAPSULE | Freq: Every evening | ORAL | 0 refills | Status: DC | PRN

## 2016-05-07 NOTE — Telephone Encounter (Signed)
Faxed to [facility], pt notified.

## 2016-05-07 NOTE — Telephone Encounter (Signed)
Reordered for 90 day supply and no refills

## 2016-05-07 NOTE — Telephone Encounter (Signed)
Received fax confirmation [facility].  Relayed to pt.

## 2016-05-07 NOTE — Telephone Encounter (Signed)
I called pt and he would like 90 day supply with no refill or 30 days with 2 refills as his insurance only give 90 in a yr.

## 2016-05-07 NOTE — Addendum Note (Signed)
Addended by: Trudie Buckler on: 05/07/2016 10:41 AM   Modules accepted: Orders

## 2016-05-12 ENCOUNTER — Other Ambulatory Visit: Payer: Self-pay | Admitting: Neurology

## 2016-09-18 DIAGNOSIS — J300 Vasomotor rhinitis: Secondary | ICD-10-CM

## 2016-09-18 DIAGNOSIS — R040 Epistaxis: Secondary | ICD-10-CM

## 2016-09-18 HISTORY — DX: Vasomotor rhinitis: J30.0

## 2016-09-18 HISTORY — DX: Epistaxis: R04.0

## 2016-09-30 ENCOUNTER — Other Ambulatory Visit: Payer: Self-pay | Admitting: Neurology

## 2017-05-08 ENCOUNTER — Other Ambulatory Visit: Payer: Self-pay | Admitting: Neurology

## 2017-05-11 ENCOUNTER — Ambulatory Visit (INDEPENDENT_AMBULATORY_CARE_PROVIDER_SITE_OTHER): Payer: Medicare Other | Admitting: Neurology

## 2017-05-11 ENCOUNTER — Encounter: Payer: Self-pay | Admitting: Neurology

## 2017-05-11 VITALS — BP 130/78 | HR 53 | Ht 67.0 in | Wt 167.5 lb

## 2017-05-11 DIAGNOSIS — R413 Other amnesia: Secondary | ICD-10-CM

## 2017-05-11 MED ORDER — DONEPEZIL HCL 10 MG PO TABS: 10.0000 mg | ORAL_TABLET | Freq: Every day | ORAL | 3 refills | Status: DC

## 2017-05-11 MED ORDER — ZALEPLON 10 MG PO CAPS: 10.0000 mg | ORAL_CAPSULE | Freq: Every evening | ORAL | 0 refills | Status: DC | PRN

## 2017-05-11 NOTE — Progress Notes (Signed)
Faxed printed/signed rx zaleplon to [facility] at [phone]. Received fax confirmation.

## 2017-05-11 NOTE — Progress Notes (Signed)
Reason for visit: Memory disturbance  Frank Meyer is an 76 y.o. male  History of present illness:  Frank Meyer is a 76 year old right-handed white male with a history of a mild memory disturbance. The patient is still functioning at a high level. He is able to operate a motor vehicle but he requires the use of a GPS system. The patient has difficulty remembering names for people, he is also having problems with initiation of tasks. He does have some problems with insomnia, he will take Sonata once or twice a week to help him sleep when he wakes up and middle the night and cannot get back to sleep. The patient takes trazodone at night. The patient has not given up any activities of daily living secondary to his memory issue. The patient returns to this office for an evaluation.  Past Medical History:  Diagnosis Date  . Basal cell carcinoma   . Benign prostatic hypertrophy   . Dyslipidemia   . Gallbladder problem   . Gastroesophageal reflux disease   . Hiatal hernia   . Mild memory disturbance   . TIA (transient ischemic attack)     Past Surgical History:  Procedure Laterality Date  . none      Family History  Problem Relation Age of Onset  . Obesity Mother   . Memory loss Mother     Social history:  reports that he quit smoking about 38 years ago. He has never used smokeless tobacco. He reports that he drinks about 9.0 - 12.0 oz of alcohol per week . He reports that he uses drugs.   No Known Allergies  Medications:  Prior to Admission medications   Medication Sig Start Date End Date Taking? Authorizing Provider  aspirin 81 MG tablet Take 162 mg by mouth daily.    Yes [provider]  atorvastatin (LIPITOR) 20 MG tablet Take 20 mg by mouth daily.   Yes [provider]  cholecalciferol (VITAMIN D) 1000 UNITS tablet Take 1,000 Units by mouth daily.   Yes [provider]  donepezil (ARICEPT) 10 MG tablet TAKE 1 TABLET BY MOUTH  DAILY  05/12/16  Yes Kathrynn Ducking, MD  Fexofenadine-Pseudoephedrine (ALLEGRA-D 24 HOUR PO) Take 1 tablet by mouth daily.   Yes [provider]  finasteride (PROSCAR) 5 MG tablet Take 5 mg by mouth daily.   Yes [provider]  ipratropium (ATROVENT) 0.06 % nasal spray  02/09/13  Yes [provider]  Multiple Vitamins-Minerals (MULTIVITAMIN ADULTS 50+) TABS Take by mouth.   Yes [provider]  omeprazole (PRILOSEC) 40 MG capsule  03/29/13  Yes [provider]  traZODone (DESYREL) 100 MG tablet TAKE 2 TABLETS BY MOUTH AT  BEDTIME 09/30/16  Yes Kathrynn Ducking, MD  zaleplon (SONATA) 10 MG capsule Take 1 capsule (10 mg total) by mouth at bedtime as needed for sleep. 05/07/16  Yes Ward Givens, NP    ROS:  Out of a complete 14 system review of symptoms, the patient complains only of the following symptoms, and all other reviewed systems are negative.  Memory disturbance  Blood pressure 130/78, pulse (!) 53, height 5\' 7"  (1.702 m), weight 167 lb 8 oz (76 kg).  Physical Exam  General: The patient is alert and cooperative at the time of the examination.  Skin: No significant peripheral edema is noted.   Neurologic Exam  Mental status: The patient is alert and oriented x 3 at the time of the examination. The  patient has apparent normal recent and remote memory, with an apparently normal attention span and concentration ability. The Mini-Mental Status Examination done today shows a total score of 29/30.   Cranial nerves: Facial symmetry is present. Speech is normal, no aphasia or dysarthria is noted. Extraocular movements are full. Visual fields are full.  Motor: The patient has good strength in all 4 extremities.  Sensory examination: Soft touch sensation is symmetric on the face, arms, and legs.  Coordination: The patient has good finger-nose-finger and heel-to-shin bilaterally.  Gait and station: The patient has a normal gait. Tandem gait is  normal. Romberg is negative. No drift is seen.  Reflexes: Deep tendon reflexes are symmetric.   Assessment/Plan:  1. Mild memory disturbance  The patient appears to be relatively stable with his cognitive functioning but he continues to note some troubles with directions and memory names for people. The patient will continue the Aricept, he is tolerating this well. He was given prescriptions for the Sonata, and for the Aricept. He will follow-up in one year, sooner if needed.  Jill Alexanders MD 05/11/2017 10:10 AM  [facility] [address] [place], [place] [address]  Phone [phone] Fax [phone]

## 2017-06-28 ENCOUNTER — Other Ambulatory Visit: Payer: Self-pay | Admitting: Neurology

## 2018-02-24 ENCOUNTER — Other Ambulatory Visit: Payer: Self-pay | Admitting: Neurology

## 2018-03-07 ENCOUNTER — Other Ambulatory Visit: Payer: Self-pay | Admitting: Internal Medicine

## 2018-03-07 DIAGNOSIS — R0609 Other forms of dyspnea: Secondary | ICD-10-CM

## 2018-03-07 DIAGNOSIS — R918 Other nonspecific abnormal finding of lung field: Secondary | ICD-10-CM

## 2018-03-11 ENCOUNTER — Ambulatory Visit
Admission: RE | Admit: 2018-03-11 | Discharge: 2018-03-11 | Disposition: A | Discharge status: Home / Self Care | Payer: Medicare Other | Source: Ambulatory Visit | Attending: Internal Medicine | Admitting: Internal Medicine

## 2018-03-11 DIAGNOSIS — R0609 Other forms of dyspnea: Secondary | ICD-10-CM

## 2018-03-11 DIAGNOSIS — R918 Other nonspecific abnormal finding of lung field: Secondary | ICD-10-CM

## 2018-03-11 MED ORDER — IOPAMIDOL (ISOVUE-370) INJECTION 76%
75.0000 mL | Freq: Once | INTRAVENOUS | Status: AC | PRN
  Administered 2018-03-11: 75 mL via INTRAVENOUS

## 2018-03-18 ENCOUNTER — Ambulatory Visit: Payer: Medicare Other | Admitting: Cardiology

## 2018-03-18 ENCOUNTER — Encounter: Payer: Self-pay | Admitting: Cardiology

## 2018-03-18 DIAGNOSIS — R0609 Other forms of dyspnea: Secondary | ICD-10-CM

## 2018-03-18 DIAGNOSIS — Z87891 Personal history of nicotine dependence: Secondary | ICD-10-CM | POA: Insufficient documentation

## 2018-03-18 DIAGNOSIS — J439 Emphysema, unspecified: Secondary | ICD-10-CM | POA: Insufficient documentation

## 2018-03-18 DIAGNOSIS — E785 Hyperlipidemia, unspecified: Secondary | ICD-10-CM | POA: Insufficient documentation

## 2018-03-18 DIAGNOSIS — J431 Panlobular emphysema: Secondary | ICD-10-CM | POA: Insufficient documentation

## 2018-03-18 DIAGNOSIS — R0600 Dyspnea, unspecified: Secondary | ICD-10-CM

## 2018-03-18 HISTORY — DX: Emphysema, unspecified: J43.9

## 2018-03-18 HISTORY — DX: Dyspnea, unspecified: R06.00

## 2018-03-18 HISTORY — DX: Other forms of dyspnea: R06.09

## 2018-03-18 HISTORY — DX: Personal history of nicotine dependence: Z87.891

## 2018-03-18 HISTORY — DX: Panlobular emphysema: J43.1

## 2018-03-18 NOTE — Patient Instructions (Signed)
Medication Instructions:  Your physician recommends that you continue on your current medications as directed. Please refer to the Current Medication list given to you today.   Labwork: None  Testing/Procedures: You had an EKG today.  Your physician has requested that you have an echocardiogram. Echocardiography is a painless test that uses sound waves to create images of your heart. It provides your doctor with information about the size and shape of your heart and how well your heart's chambers and valves are working. This procedure takes approximately one hour. There are no restrictions for this procedure.  Your physician has requested that you have a stress echocardiogram. For further information please visit [email or web address]. Please follow instruction sheet as given.   Follow-Up: Your physician recommends that you schedule a follow-up appointment in: 1 month.   If you need a refill on your cardiac medications before your next appointment, please call your pharmacy.   Thank you for choosing [facility]! Robyne Peers, RN [phone]    Echocardiogram An echocardiogram, or echocardiography, uses sound waves (ultrasound) to produce an image of your heart. The echocardiogram is simple, painless, obtained within a short period of time, and offers valuable information to your health care provider. The images from an echocardiogram can provide information such as:  Evidence of coronary artery disease (CAD).  Heart size.  Heart muscle function.  Heart valve function.  Aneurysm detection.  Evidence of a past heart attack.  Fluid buildup around the heart.  Heart muscle thickening.  Assess heart valve function.  Tell a health care provider about:  Any allergies you have.  All medicines you are taking, including vitamins, herbs, eye drops, creams, and over-the-counter medicines.  Any problems you or family members have had with anesthetic medicines.  Any  blood disorders you have.  Any surgeries you have had.  Any medical conditions you have.  Whether you are pregnant or may be pregnant. What happens before the procedure? No special preparation is needed. Eat and drink normally. What happens during the procedure?  In order to produce an image of your heart, gel will be applied to your chest and a wand-like tool (transducer) will be moved over your chest. The gel will help transmit the sound waves from the transducer. The sound waves will harmlessly bounce off your heart to allow the heart images to be captured in real-time motion. These images will then be recorded.  You may need an IV to receive a medicine that improves the quality of the pictures. What happens after the procedure? You may return to your normal schedule including diet, activities, and medicines, unless your health care provider tells you otherwise. This information is not intended to replace advice given to you by your health care provider. Make sure you discuss any questions you have with your health care provider. Document Released: 07/31/2000 Document Revised: 03/21/2016 Document Reviewed: 04/10/2013 Elsevier Interactive Patient Education  2017 [employer].    Exercise Stress Electrocardiogram An exercise stress electrocardiogram is a test to check how blood flows to your heart. It is done to find areas of poor blood flow. You will need to walk on a treadmill for this test. The electrocardiogram will record your heartbeat when you are at rest and when you are exercising. What happens before the procedure?  Do not have drinks with caffeine or foods with caffeine for 24 hours before the test, or as told by your doctor. This includes coffee, tea (even decaf tea), sodas, chocolate, and cocoa.  Follow your  doctor's instructions about eating and drinking before the test.  Ask your doctor what medicines you should or should not take before the test. Take your medicines  with water unless told by your doctor not to.  If you use an inhaler, bring it with you to the test.  Bring a snack to eat after the test.  Do not  smoke for 4 hours before the test.  Do not put lotions, powders, creams, or oils on your chest before the test.  Wear comfortable shoes and clothing. What happens during the procedure?  You will have patches put on your chest. Small areas of your chest may need to be shaved. Wires will be connected to the patches.  Your heart rate will be watched while you are resting and while you are exercising.  You will walk on the treadmill. The treadmill will slowly get faster to raise your heart rate.  The test will take about 1-2 hours. What happens after the procedure?  Your heart rate and blood pressure will be watched after the test.  You may return to your normal diet, activities, and medicines or as told by your doctor. This information is not intended to replace advice given to you by your health care provider. Make sure you discuss any questions you have with your health care provider. Document Released: 01/20/2008 Document Revised: 04/01/2016 Document Reviewed: 04/10/2013 Elsevier Interactive Patient Education  [employer].

## 2018-03-18 NOTE — Progress Notes (Signed)
ekg 

## 2018-03-18 NOTE — Progress Notes (Signed)
Cardiology Consultation:    Date:  03/18/2018   ID:  Frank Meyer, DOB 13-Jun-1941, MRN [id number]  PCP:  Burnard Bunting, MD  Cardiologist:  Jenne Campus, MD   Referring MD: Burnard Bunting, MD   Chief Complaint  Patient presents with  . New Patient (Initial Visit)    having some shortness of breath when walking  I have shortness of breath while walking  History of Present Illness:    Frank Meyer is a 77 y.o. male who is being seen today for the evaluation of shortness of breath at the request of Burnard Bunting, MD.  With history of dyslipidemia as well as remote history of smoking.  He exercised on the regular basis he walks every day about 5 miles he was fit bit and every single day he get between 10 and 15,000 steps however for the last 6 months he noted anytime he climb uphill he will get short of breath it was not the case 6 months ago.  Denies having a tightness squeezing pressure burning chest just exertional shortness of breath work-up has been initiated which included CT which shows some changes he is scheduled to see pulmonologist as well. Risk factors for coronary artery disease include remote history of smoking, dyslipidemia, some family history.   Past Medical History:  Diagnosis Date  . Basal cell carcinoma   . Benign prostatic hypertrophy   . Dyslipidemia   . Gallbladder problem   . Gastroesophageal reflux disease   . Hiatal hernia   . Mild memory disturbance   . TIA (transient ischemic attack)     Past Surgical History:  Procedure Laterality Date  . none      Current Medications: Current Meds  Medication Sig  . aspirin 81 MG tablet Take 162 mg by mouth daily.   Marland Kitchen atorvastatin (LIPITOR) 20 MG tablet Take 20 mg by mouth daily.  . cholecalciferol (VITAMIN D) 1000 UNITS tablet Take 1,000 Units by mouth daily.  Marland Kitchen donepezil (ARICEPT) 10 MG tablet Take 1 tablet (10 mg total) by mouth daily.  Marland Kitchen Fexofenadine-Pseudoephedrine (ALLEGRA-D 24 HOUR  PO) Take 1 tablet by mouth daily.  . finasteride (PROSCAR) 5 MG tablet Take 5 mg by mouth daily.  Marland Kitchen ipratropium (ATROVENT) 0.06 % nasal spray   . Multiple Vitamins-Minerals (MULTIVITAMIN ADULTS 50+) TABS Take by mouth.  Marland Kitchen omeprazole (PRILOSEC) 40 MG capsule   . traZODone (DESYREL) 100 MG tablet TAKE 2 TABLETS BY MOUTH AT  BEDTIME  . zaleplon (SONATA) 10 MG capsule Take 1 capsule (10 mg total) by mouth at bedtime as needed for sleep.     Allergies:   Patient has no known allergies.   Social History   Socioeconomic History  . Marital status: Married    Spouse name: pamela  . Number of children: 2  . Years of education: college  . Highest education level: Not on file  Occupational History  . Occupation: retired  Scientific laboratory technician  . Financial resource strain: Not on file  . Food insecurity:    Worry: Not on file    Inability: Not on file  . Transportation needs:    Medical: Not on file    Non-medical: Not on file  Tobacco Use  . Smoking status: Former Smoker    Last attempt to quit: 05/04/1979    Years since quitting: 38.8  . Smokeless tobacco: Never Used  Substance and Sexual Activity  . Alcohol use: Yes    Alcohol/week: 9.0 - 12.0 oz  Types: 15 - 20 Cans of beer per week  . Drug use: Yes  . Sexual activity: Not on file  Lifestyle  . Physical activity:    Days per week: Not on file    Minutes per session: Not on file  . Stress: Not on file  Relationships  . Social connections:    Talks on phone: Not on file    Gets together: Not on file    Attends religious service: Not on file    Active member of club or organization: Not on file    Attends meetings of clubs or organizations: Not on file    Relationship status: Not on file  Other Topics Concern  . Not on file  Social History Narrative   Pt is retired. He is married. The pt lives with his wife. He has a Best boy. The pt has 2 children.      Patient drinks 2-3 cups of caffeine daily.   Patient is right  handed.      Family History: The patient's family history includes Memory loss in his mother; Obesity in his mother. ROS:   Please see the history of present illness.    All 14 point review of systems negative except as described per history of present illness.  EKGs/Labs/Other Studies Reviewed:    The following studies were reviewed today:   EKG:  EKG is  ordered today.  The ekg ordered today demonstrates EKG showed sinus bradycardia rate of 52 normal P interval poor R wave progression anterior precordium  Recent Labs: No results found for requested labs within last 8760 hours.  Recent Lipid Panel No results found for: CHOL, TRIG, HDL, CHOLHDL, VLDL, LDLCALC, LDLDIRECT  Physical Exam:    VS:  BP 120/82 (BP Location: Right Arm)   Pulse (!) 51   Ht 5\' 7"  (1.702 m)   Wt 164 lb 1.9 oz (74.4 kg)   SpO2 97%   BMI 25.70 kg/m     Wt Readings from Last 3 Encounters:  03/18/18 164 lb 1.9 oz (74.4 kg)  05/11/17 167 lb 8 oz (76 kg)  05/06/16 170 lb 6.4 oz (77.3 kg)     GEN:  Well nourished, well developed in no acute distress HEENT: Normal NECK: No JVD; No carotid bruits LYMPHATICS: No lymphadenopathy CARDIAC: RRR, no murmurs, no rubs, no gallops RESPIRATORY:  Clear to auscultation without rales, wheezing or rhonchi  ABDOMEN: Soft, non-tender, non-distended MUSCULOSKELETAL:  No edema; No deformity  SKIN: Warm and dry NEUROLOGIC:  Alert and oriented x 3 PSYCHIATRIC:  Normal affect   ASSESSMENT:    1. Dyspnea on exertion   2. Panlobular emphysema ([place])   3. Dyslipidemia   4. History of smoking    PLAN:    In order of problems listed above:  1. Dyspnea on exertion in this gentleman with multiple risk factors for coronary artery disease.  He does have also abnormal EKG ask him to have an echocardiogram to assess left ventricular ejection fraction.  If he is scheduled to have a stress echocardiogram.  He is already on aspirin and statin which I will continue.  I asked him  not to push himself while walking until we have a stress test done. 2. Dyslipidemia he is on statin which I will continue. 3. Emphysema is scheduled to see pulmonary. 4. History of smoking: Quit smoking 1992.  See him back in my office in 1 month significant problem   Medication Adjustments/Labs and Tests Ordered: Current medicines are  reviewed at length with the patient today.  Concerns regarding medicines are outlined above.  No orders of the defined types were placed in this encounter.  No orders of the defined types were placed in this encounter.   Signed, Park Liter, MD, [facility]. 03/18/2018 11:09 AM    [place]

## 2018-03-23 ENCOUNTER — Ambulatory Visit (HOSPITAL_BASED_OUTPATIENT_CLINIC_OR_DEPARTMENT_OTHER)
Admission: RE | Admit: 2018-03-23 | Discharge: 2018-03-23 | Disposition: A | Discharge status: Home / Self Care | Payer: Medicare Other | Source: Ambulatory Visit | Attending: Cardiology | Admitting: Cardiology

## 2018-03-23 DIAGNOSIS — E785 Hyperlipidemia, unspecified: Secondary | ICD-10-CM | POA: Diagnosis not present

## 2018-03-23 DIAGNOSIS — G459 Transient cerebral ischemic attack, unspecified: Secondary | ICD-10-CM | POA: Diagnosis not present

## 2018-03-23 DIAGNOSIS — I083 Combined rheumatic disorders of mitral, aortic and tricuspid valves: Secondary | ICD-10-CM | POA: Insufficient documentation

## 2018-03-23 DIAGNOSIS — R0609 Other forms of dyspnea: Secondary | ICD-10-CM | POA: Insufficient documentation

## 2018-03-23 NOTE — Progress Notes (Signed)
  Echocardiogram 2D Echocardiogram has been performed.  Frank Meyer Frank Meyer 03/23/2018, 11:42 AM

## 2018-03-24 NOTE — Progress Notes (Addendum)
Synopsis: Referred in August 2019 for pulmonary infiltrate and dyspnea on exertion by Burnard Bunting, MD  Subjective:   PATIENT ID: Frank Meyer GENDER: male DOB: Sep 27, 1940, MRN: [id number]  Chief Complaint  Patient presents with  . pulmonary consult    referred by Dr. Reynaldo Minium for shortness of breath and nodule on lung    Patient has a past medical history of gastroesophageal reflux disease, TIA, hyperlipidemia.  He complains of dyspnea on exertion which is worse with climbing elevation.  He exercises regularly obtaining 10-15,000 steps per day. Playing golf 2 times per week.   He was recently seen by cardiology for evaluation of dyspnea on exertion.  An echocardiogram was performed on 03/23/2018, with a preserved EF 55-60% and grade 1 diastolic dysfunction.   The patient is a former smoker,  2 ppd, for 30 years, quit in 1992.   He has had SOB for the past 3-6 months. He notices it most with climbing hills while playing golf. No other aggravating symtpoms. Walking on treadmill he has no problem. No chest pain or tightness. Denies cough.  Stopping and resting his symptoms improved.  He also does not experience the symptoms as long as he is not on elevation.   Past Medical History:  Diagnosis Date  . Basal cell carcinoma   . Benign prostatic hypertrophy   . Dyslipidemia   . Gallbladder problem   . Gastroesophageal reflux disease   . Hiatal hernia   . Mild memory disturbance   . TIA (transient ischemic attack)      Family History  Problem Relation Age of Onset  . Obesity Mother   . Memory loss Mother      Social History   Socioeconomic History  . Marital status: Married    Spouse name: pamela  . Number of children: 2  . Years of education: college  . Highest education level: Not on file  Occupational History  . Occupation: retired  Scientific laboratory technician  . Financial resource strain: Not on file  . Food insecurity:    Worry: Not on file    Inability: Not on file  .  Transportation needs:    Medical: Not on file    Non-medical: Not on file  Tobacco Use  . Smoking status: Former Smoker    Last attempt to quit: 05/04/1979    Years since quitting: 38.9  . Smokeless tobacco: Never Used  Substance and Sexual Activity  . Alcohol use: Yes    Alcohol/week: 15.0 - 20.0 standard drinks    Types: 15 - 20 Cans of beer per week  . Drug use: Yes  . Sexual activity: Not on file  Lifestyle  . Physical activity:    Days per week: Not on file    Minutes per session: Not on file  . Stress: Not on file  Relationships  . Social connections:    Talks on phone: Not on file    Gets together: Not on file    Attends religious service: Not on file    Active member of club or organization: Not on file    Attends meetings of clubs or organizations: Not on file    Relationship status: Not on file  . Intimate partner violence:    Fear of current or ex partner: Not on file    Emotionally abused: Not on file    Physically abused: Not on file    Forced sexual activity: Not on file  Other Topics Concern  . Not on  file  Social History Narrative   Pt is retired. He is married. The pt lives with his wife. He has a Best boy. The pt has 2 children.      Patient drinks 2-3 cups of caffeine daily.   Patient is right handed.      No Known Allergies   Outpatient Medications Prior to Visit  Medication Sig Dispense Refill  . aspirin 81 MG tablet Take 162 mg by mouth daily.     Marland Kitchen atorvastatin (LIPITOR) 20 MG tablet Take 20 mg by mouth daily.    . cholecalciferol (VITAMIN D) 1000 UNITS tablet Take 1,000 Units by mouth daily.    Marland Kitchen donepezil (ARICEPT) 10 MG tablet Take 1 tablet (10 mg total) by mouth daily. 90 tablet 3  . Fexofenadine-Pseudoephedrine (ALLEGRA-D 24 HOUR PO) Take 1 tablet by mouth daily.    . finasteride (PROSCAR) 5 MG tablet Take 5 mg by mouth daily.    Marland Kitchen ipratropium (ATROVENT) 0.06 % nasal spray     . Multiple Vitamins-Minerals (MULTIVITAMIN ADULTS 50+)  TABS Take by mouth.    Marland Kitchen omeprazole (PRILOSEC) 40 MG capsule     . traZODone (DESYREL) 100 MG tablet TAKE 2 TABLETS BY MOUTH AT  BEDTIME 180 tablet 2  . zaleplon (SONATA) 10 MG capsule Take 1 capsule (10 mg total) by mouth at bedtime as needed for sleep. 90 capsule 0   No facility-administered medications prior to visit.     Review of Systems  Constitutional: Negative for chills, fever, malaise/fatigue and weight loss.  HENT: Negative for hearing loss, sore throat and tinnitus.   Eyes: Negative for blurred vision and double vision.  Respiratory: Positive for shortness of breath (Worse with climbing hills or increased elevation ). Negative for cough, hemoptysis, sputum production, wheezing and stridor.   Cardiovascular: Negative for chest pain, palpitations, orthopnea, leg swelling and PND.  Gastrointestinal: Negative for abdominal pain, constipation, diarrhea, heartburn, nausea and vomiting.  Genitourinary: Negative for dysuria, hematuria and urgency.  Musculoskeletal: Negative for joint pain and myalgias.  Skin: Negative for itching and rash.  Neurological: Negative for dizziness, tingling, weakness and headaches.  Endo/Heme/Allergies: Negative for environmental allergies. Does not bruise/bleed easily.  Psychiatric/Behavioral: Negative for depression. The patient is not nervous/anxious and does not have insomnia.   All other systems reviewed and are negative.    Objective:  Physical Exam  Constitutional: He is oriented to person, place, and time. He appears well-developed and well-nourished. No distress.  HENT:  Head: Normocephalic and atraumatic.  Mouth/Throat: Oropharynx is clear and moist.  Eyes: Pupils are equal, round, and reactive to light. Conjunctivae are normal. No scleral icterus.  Neck: Neck supple. No JVD present. No tracheal deviation present.  Cardiovascular: Normal rate, regular rhythm, normal heart sounds and intact distal pulses.  No murmur heard. Pulmonary/Chest:  Effort normal. No accessory muscle usage or stridor. No tachypnea. No respiratory distress. He has no wheezes. He has no rhonchi. He has no rales.  Distant breath sounds  Abdominal: Soft. Bowel sounds are normal. He exhibits no distension. There is no tenderness.  Musculoskeletal: He exhibits no edema or tenderness.  Lymphadenopathy:    He has no cervical adenopathy.  Neurological: He is alert and oriented to person, place, and time.  Skin: Skin is warm and dry. Capillary refill takes less than 2 seconds. No rash noted.  Psychiatric: He has a normal mood and affect. His behavior is normal.  Vitals reviewed.    Vitals:   03/25/18 1103  BP:  118/68  Pulse: 66  SpO2: 90%  Weight: 164 lb 3.2 oz (74.5 kg)  Height: 5\' 7"  (1.702 m)   90% on RA BMI Readings from Last 3 Encounters:  03/25/18 25.72 kg/m  03/18/18 25.70 kg/m  05/11/17 26.23 kg/m   Wt Readings from Last 3 Encounters:  03/25/18 164 lb 3.2 oz (74.5 kg)  03/18/18 164 lb 1.9 oz (74.4 kg)  05/11/17 167 lb 8 oz (76 kg)     CBC No results found for: WBC, RBC, HGB, HCT, PLT, MCV, MCH, MCHC, RDW, LYMPHSABS, MONOABS, EOSABS, BASOSABS  New labs pending  Chest Imaging: CT imaging with right upper lobe lesion with associated right hilar and mediastinal adenopathy.  He does have lung nodules within the right lower lobe and left upper lobe.  He has significant centrilobular and paraseptal emphysema.  Pulmonary Functions Testing Results: No results found for: FEV1, FVC, FEV1FVC, TLC, DLCO  FeNO: None  Pathology: None   Echocardiogram:   03/23/2018 Study Conclusions - Left ventricle: The cavity size was normal. Systolic function was   normal. The estimated ejection fraction was in the range of 55%   to 60%. Wall motion was normal; there were no regional wall   motion abnormalities. Doppler parameters are consistent with   abnormal left ventricular relaxation (grade 1 diastolic   dysfunction). - Aortic valve: There was  trivial regurgitation. - Mitral valve: There was mild regurgitation. - Pulmonary arteries: PA peak pressure: 31 mm Hg (S). Impressions: - Normal LVEF.   Ascending aorte - 4.0 cm   Trace AR.  Heart Catheterization: None     Assessment & Plan:   Mass of upper lobe of right lung - Plan: NM PET Image Initial (PI) Skull Base To Thigh, QuantiFERON-TB Gold Plus, CBC w/Diff, Basic Metabolic Panel (BMET)  Dyspnea on exertion - Plan: CBC w/Diff, Basic Metabolic Panel (BMET), Pulmonary Function Test  Pulmonary emphysema, unspecified emphysema type (HCC) - Plan: CBC w/Diff, Basic Metabolic Panel (BMET)  History of smoking - Plan: CBC w/Diff, Basic Metabolic Panel (BMET)  Chronic diastolic heart failure (HCC) - 03/23/2018 - ECHO with preserved EF 56-21%, Grade 1 Diastolic Dysfunction  - Plan: CBC w/Diff, Basic Metabolic Panel (BMET)  Solitary pulmonary nodule - Plan: CBC w/Diff, Basic Metabolic Panel (BMET)  Multiple pulmonary nodules - Plan: NM PET Image Initial (PI) Skull Base To Thigh, QuantiFERON-TB Gold Plus, CBC w/Diff, Basic Metabolic Panel (BMET)  Mediastinal adenopathy - Plan: NM PET Image Initial (PI) Skull Base To Thigh, CBC w/Diff, Basic Metabolic Panel (BMET)  Hilar adenopathy - Plan: NM PET Image Initial (PI) Skull Base To Thigh, CBC w/Diff, Basic Metabolic Panel (BMET), QuantiFERON-TB Gold Plus  Discussion:  This is a 77 year old gentleman with a significant appearing right upper lobe consolidation with possible area of early cavitation with soft tissue component. He has significant associated emphysema on CT imaging.  He has no infectious symptoms or recent travel outside of the country.  He does have mediastinal and hilar adenopathy.  We plan to obtain a nuclear medicine PET imaging to help identify and facilitate the most appropriate biopsy location.  Pending results of the PET scan will discuss options for tissue sampling.  If there is no evidence of extrathoracic disease  will plan for endobronchial ultrasound guided transbronchial needle aspiration  We will obtain baseline labs including CBC with differential, BMP and QuantiFERON.  I do not suspect he has TB as he has no risk factors however do feel obligated due to right upper lobe presence  of disease and possible early cavitation.  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 PFTs pending. Will start on Anoro daily as well as as needed albuterol.  Will order full PFTs  Return to clinic in 2 weeks following imaging studies and pulmonary function.   Current Outpatient Medications:  .  aspirin 81 MG tablet, Take 162 mg by mouth daily. , Disp: , Rfl:  .  atorvastatin (LIPITOR) 20 MG tablet, Take 20 mg by mouth daily., Disp: , Rfl:  .  cholecalciferol (VITAMIN D) 1000 UNITS tablet, Take 1,000 Units by mouth daily., Disp: , Rfl:  .  donepezil (ARICEPT) 10 MG tablet, Take 1 tablet (10 mg total) by mouth daily., Disp: 90 tablet, Rfl: 3 .  Fexofenadine-Pseudoephedrine (ALLEGRA-D 24 HOUR PO), Take 1 tablet by mouth daily., Disp: , Rfl:  .  finasteride (PROSCAR) 5 MG tablet, Take 5 mg by mouth daily., Disp: , Rfl:  .  ipratropium (ATROVENT) 0.06 % nasal spray, , Disp: , Rfl:  .  Multiple Vitamins-Minerals (MULTIVITAMIN ADULTS 50+) TABS, Take by mouth., Disp: , Rfl:  .  omeprazole (PRILOSEC) 40 MG capsule, , Disp: , Rfl:  .  traZODone (DESYREL) 100 MG tablet, TAKE 2 TABLETS BY MOUTH AT  BEDTIME, Disp: 180 tablet, Rfl: 2 .  zaleplon (SONATA) 10 MG capsule, Take 1 capsule (10 mg total) by mouth at bedtime as needed for sleep., Disp: 90 capsule, Rfl: 0 .  albuterol (PROVENTIL HFA;VENTOLIN HFA) 108 (90 Base) MCG/ACT inhaler, Inhale 2 puffs into the lungs every 6 (six) hours as needed for wheezing or shortness of breath., Disp: 1 Inhaler, Rfl: 6 .  umeclidinium-vilanterol (ANORO ELLIPTA) 62.5-25 MCG/INH AEPB, Inhale 1 puff into the lungs daily., Disp: 1 each, Rfl: 3   Garner Nash, DO [facility] 03/25/2018 11:51 AM

## 2018-03-25 ENCOUNTER — Encounter: Payer: Self-pay | Admitting: Pulmonary Disease

## 2018-03-25 ENCOUNTER — Other Ambulatory Visit (INDEPENDENT_AMBULATORY_CARE_PROVIDER_SITE_OTHER): Payer: Medicare Other

## 2018-03-25 ENCOUNTER — Ambulatory Visit: Payer: Medicare Other | Admitting: Pulmonary Disease

## 2018-03-25 VITALS — BP 118/68 | HR 66 | Ht 67.0 in | Wt 164.2 lb

## 2018-03-25 DIAGNOSIS — R911 Solitary pulmonary nodule: Secondary | ICD-10-CM

## 2018-03-25 DIAGNOSIS — R0609 Other forms of dyspnea: Secondary | ICD-10-CM

## 2018-03-25 DIAGNOSIS — R918 Other nonspecific abnormal finding of lung field: Secondary | ICD-10-CM

## 2018-03-25 DIAGNOSIS — J439 Emphysema, unspecified: Secondary | ICD-10-CM | POA: Diagnosis not present

## 2018-03-25 DIAGNOSIS — Z87891 Personal history of nicotine dependence: Secondary | ICD-10-CM

## 2018-03-25 DIAGNOSIS — I5032 Chronic diastolic (congestive) heart failure: Secondary | ICD-10-CM

## 2018-03-25 DIAGNOSIS — R590 Localized enlarged lymph nodes: Secondary | ICD-10-CM

## 2018-03-25 HISTORY — DX: Localized enlarged lymph nodes: R59.0

## 2018-03-25 HISTORY — DX: Solitary pulmonary nodule: R91.1

## 2018-03-25 LAB — BASIC METABOLIC PANEL
BUN: 19 mg/dL (ref 6–23)
CALCIUM: 9.4 mg/dL (ref 8.4–10.5)
CO2: 32 mEq/L (ref 19–32)
CREATININE: 1.29 mg/dL (ref 0.40–1.50)
Chloride: 100 mEq/L (ref 96–112)
GFR: 57.37 mL/min — AB (ref >60.00)
Glucose, Bld: 104 mg/dL — ABNORMAL HIGH (ref 70–99)
Potassium: 5.1 mEq/L (ref 3.5–5.1)
SODIUM: 137 meq/L (ref 135–145)

## 2018-03-25 LAB — CBC WITH DIFFERENTIAL/PLATELET
BASOS ABS: 0.1 10*3/uL (ref 0.0–0.1)
Basophils Relative: 0.8 % (ref 0.0–3.0)
EOS ABS: 0.4 10*3/uL (ref 0.0–0.7)
Eosinophils Relative: 3 % (ref 0.0–5.0)
HEMATOCRIT: 44 % (ref 39.0–52.0)
Hemoglobin: 14.3 g/dL (ref 13.0–17.0)
LYMPHS PCT: 19.8 % (ref 12.0–46.0)
Lymphs Abs: 2.4 10*3/uL (ref 0.7–4.0)
MCHC: 32.5 g/dL (ref 30.0–36.0)
MCV: 85.7 fl (ref 78.0–100.0)
MONOS PCT: 7.1 % (ref 3.0–12.0)
Monocytes Absolute: 0.9 10*3/uL (ref 0.1–1.0)
NEUTROS PCT: 69.3 % (ref 43.0–77.0)
Neutro Abs: 8.5 10*3/uL — ABNORMAL HIGH (ref 1.4–7.7)
Platelets: 268 10*3/uL (ref 150.0–400.0)
RBC: 5.13 Mil/uL (ref 4.22–5.81)
RDW: 14.6 % (ref 11.5–15.5)
WBC: 12.3 10*3/uL — ABNORMAL HIGH (ref 4.0–10.5)

## 2018-03-25 MED ORDER — UMECLIDINIUM-VILANTEROL 62.5-25 MCG/INH IN AEPB: 1.0000 | INHALATION_SPRAY | Freq: Every day | RESPIRATORY_TRACT | 3 refills | Status: DC

## 2018-03-25 MED ORDER — ALBUTEROL SULFATE HFA 108 (90 BASE) MCG/ACT IN AERS: 2.0000 | INHALATION_SPRAY | Freq: Four times a day (QID) | RESPIRATORY_TRACT | 6 refills | Status: DC | PRN

## 2018-03-25 NOTE — Patient Instructions (Addendum)
We will obtain a PET CT scan for further evaluation of your right upper lobe lesion that was seen on CT as well as enlarged lymph nodes within the chest. Depending upon the results of the PET scan we will discuss need for lung biopsy and the different methods in which we will approach this. We will obtain blood work today to include a QuantiFERON, CBC with diff and BMP.  Will start Anoro inhaler one puff once daily and give you an as need albuterol inhaler for symptoms.  We will complete FULL Pulmonary functions to establish baseline lung function.  RTC in 2 weeks, pending PET results.

## 2018-03-28 ENCOUNTER — Other Ambulatory Visit (HOSPITAL_BASED_OUTPATIENT_CLINIC_OR_DEPARTMENT_OTHER): Payer: Medicare Other

## 2018-03-29 ENCOUNTER — Ambulatory Visit (HOSPITAL_BASED_OUTPATIENT_CLINIC_OR_DEPARTMENT_OTHER)
Admission: RE | Admit: 2018-03-29 | Discharge: 2018-03-29 | Disposition: A | Discharge status: Home / Self Care | Payer: Medicare Other | Source: Ambulatory Visit | Attending: Cardiology | Admitting: Cardiology

## 2018-03-29 DIAGNOSIS — R0609 Other forms of dyspnea: Secondary | ICD-10-CM | POA: Diagnosis present

## 2018-03-29 DIAGNOSIS — G459 Transient cerebral ischemic attack, unspecified: Secondary | ICD-10-CM | POA: Insufficient documentation

## 2018-03-29 DIAGNOSIS — R0600 Dyspnea, unspecified: Secondary | ICD-10-CM | POA: Diagnosis not present

## 2018-03-29 DIAGNOSIS — E785 Hyperlipidemia, unspecified: Secondary | ICD-10-CM | POA: Diagnosis not present

## 2018-03-29 NOTE — Progress Notes (Signed)
  Echocardiogram Echocardiogram Stress Test has been performed.  Frank Meyer Frank Meyer 03/29/2018, 10:19 AM

## 2018-04-08 ENCOUNTER — Other Ambulatory Visit: Payer: Self-pay | Admitting: Neurology

## 2018-04-08 ENCOUNTER — Ambulatory Visit (HOSPITAL_COMMUNITY)
Admission: RE | Admit: 2018-04-08 | Discharge: 2018-04-08 | Disposition: A | Discharge status: Home / Self Care | Payer: Medicare Other | Source: Ambulatory Visit | Attending: Pulmonary Disease | Admitting: Pulmonary Disease

## 2018-04-08 ENCOUNTER — Telehealth: Payer: Self-pay | Admitting: Pulmonary Disease

## 2018-04-08 DIAGNOSIS — R0602 Shortness of breath: Secondary | ICD-10-CM

## 2018-04-08 DIAGNOSIS — Z79899 Other long term (current) drug therapy: Secondary | ICD-10-CM | POA: Diagnosis not present

## 2018-04-08 DIAGNOSIS — J438 Other emphysema: Secondary | ICD-10-CM | POA: Insufficient documentation

## 2018-04-08 DIAGNOSIS — J432 Centrilobular emphysema: Secondary | ICD-10-CM | POA: Diagnosis not present

## 2018-04-08 DIAGNOSIS — R918 Other nonspecific abnormal finding of lung field: Secondary | ICD-10-CM | POA: Insufficient documentation

## 2018-04-08 DIAGNOSIS — R590 Localized enlarged lymph nodes: Secondary | ICD-10-CM | POA: Diagnosis not present

## 2018-04-08 DIAGNOSIS — I700 Atherosclerosis of aorta: Secondary | ICD-10-CM | POA: Diagnosis not present

## 2018-04-08 LAB — GLUCOSE, CAPILLARY: Glucose-Capillary: 101 mg/dL — ABNORMAL HIGH (ref 70–99)

## 2018-04-08 MED ORDER — FLUDEOXYGLUCOSE F - 18 (FDG) INJECTION
7.6900 | Freq: Once | INTRAVENOUS | Status: AC | PRN
  Administered 2018-04-08: 7.69 via INTRAVENOUS

## 2018-04-08 NOTE — Telephone Encounter (Signed)
Patient is aware of results. CT order placed. Nothing further needed.

## 2018-04-11 ENCOUNTER — Ambulatory Visit: Payer: Medicare Other | Admitting: Cardiology

## 2018-04-11 ENCOUNTER — Encounter: Payer: Self-pay | Admitting: Cardiology

## 2018-04-11 VITALS — BP 110/60 | HR 93 | Ht 67.0 in | Wt 167.4 lb

## 2018-04-11 DIAGNOSIS — J439 Emphysema, unspecified: Secondary | ICD-10-CM

## 2018-04-11 DIAGNOSIS — R0609 Other forms of dyspnea: Secondary | ICD-10-CM

## 2018-04-11 DIAGNOSIS — E785 Hyperlipidemia, unspecified: Secondary | ICD-10-CM

## 2018-04-11 DIAGNOSIS — Z87891 Personal history of nicotine dependence: Secondary | ICD-10-CM | POA: Diagnosis not present

## 2018-04-11 NOTE — Patient Instructions (Signed)
Medication Instructions:  Your physician recommends that you continue on your current medications as directed. Please refer to the Current Medication list given to you today.   Labwork: None  Testing/Procedures: None  Follow-Up: Your physician wants you to follow-up in: 5 months. You will receive a reminder letter in the mail two months in advance. If you don't receive a letter, please call our office to schedule the follow-up appointment.   If you need a refill on your cardiac medications before your next appointment, please call your pharmacy.   Thank you for choosing [facility]! Rosan Calbert, RN [phone]    

## 2018-04-11 NOTE — Progress Notes (Signed)
Cardiology Office Note:    Date:  04/11/2018   ID:  Frank Meyer, DOB Jun 11, 1941, MRN [id number]  PCP:  Burnard Bunting, MD  Cardiologist:  Jenne Campus, MD    Referring MD: Burnard Bunting, MD   Chief Complaint  Patient presents with  . 1 month follow up  Doing fair still short of breath on exertion  History of Present Illness:    Frank Meyer is a 77 y.o. 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  So far cardiac work-up is unrevealing his left ventricular ejection fraction was normal.  Stress test was done and he walked very well on the treadmill 11 minutes plus.  The only finding that he identified during the test is the fact that his right ventricle is enlarged.  His pulmonary pressure at rest was normal.  I think all those problems are related to his pulmonary issues he is being followed aggressively with pulmonary team which is very appropriate my understanding is that he does have scheduled pulmonary function test within next few days will wait for results of the test.  Past Medical History:  Diagnosis Date  . Basal cell carcinoma   . Benign prostatic hypertrophy   . Dyslipidemia   . Gallbladder problem   . Gastroesophageal reflux disease   . Hiatal hernia   . Mild memory disturbance   . TIA (transient ischemic attack)     Past Surgical History:  Procedure Laterality Date  . none      Current Medications: Current Meds  Medication Sig  . albuterol (PROVENTIL HFA;VENTOLIN HFA) 108 (90 Base) MCG/ACT inhaler Inhale 2 puffs into the lungs every 6 (six) hours as needed for wheezing or shortness of breath.  Marland Kitchen aspirin 81 MG tablet Take 81 mg by mouth daily.   Marland Kitchen atorvastatin (LIPITOR) 20 MG tablet Take 20 mg by mouth daily.  . cholecalciferol (VITAMIN D) 1000 UNITS tablet Take 1,000 Units by mouth daily.  Marland Kitchen donepezil (ARICEPT) 10 MG tablet Take 1 tablet (10 mg total) by mouth daily.  Marland Kitchen  Fexofenadine-Pseudoephedrine (ALLEGRA-D 24 HOUR PO) Take 1 tablet by mouth daily.  . finasteride (PROSCAR) 5 MG tablet Take 5 mg by mouth daily.  Marland Kitchen ipratropium (ATROVENT) 0.06 % nasal spray   . Multiple Vitamins-Minerals (MULTIVITAMIN ADULTS 50+) TABS Take by mouth.  Marland Kitchen omeprazole (PRILOSEC) 40 MG capsule   . traZODone (DESYREL) 100 MG tablet TAKE 2 TABLETS BY MOUTH AT  BEDTIME  . zaleplon (SONATA) 10 MG capsule Take 1 capsule (10 mg total) by mouth at bedtime as needed for sleep.     Allergies:   Patient has no known allergies.   Social History   Socioeconomic History  . Marital status: Married    Spouse name: pamela  . Number of children: 2  . Years of education: college  . Highest education level: Not on file  Occupational History  . Occupation: retired  Scientific laboratory technician  . Financial resource strain: Not on file  . Food insecurity:    Worry: Not on file    Inability: Not on file  . Transportation needs:    Medical: Not on file    Non-medical: Not on file  Tobacco Use  . Smoking status: Former Smoker    Last attempt to quit: 05/04/1979    Years since quitting: 38.9  . Smokeless tobacco: Never Used  Substance and Sexual Activity  . Alcohol use: Yes    Alcohol/week: 15.0 -  20.0 standard drinks    Types: 15 - 20 Cans of beer per week  . Drug use: Yes  . Sexual activity: Not on file  Lifestyle  . Physical activity:    Days per week: Not on file    Minutes per session: Not on file  . Stress: Not on file  Relationships  . Social connections:    Talks on phone: Not on file    Gets together: Not on file    Attends religious service: Not on file    Active member of club or organization: Not on file    Attends meetings of clubs or organizations: Not on file    Relationship status: Not on file  Other Topics Concern  . Not on file  Social History Narrative   Pt is retired. He is married. The pt lives with his wife. He has a Best boy. The pt has 2 children.       Patient drinks 2-3 cups of caffeine daily.   Patient is right handed.      Family History: The patient's family history includes Memory loss in his mother; Obesity in his mother. ROS:   Please see the history of present illness.    All 14 point review of systems negative except as described per history of present illness  EKGs/Labs/Other Studies Reviewed:    Echo: Left ventricle: The cavity size was normal. Systolic function was   normal. The estimated ejection fraction was in the range of 55%   to 60%. Wall motion was normal; there were no regional wall   motion abnormalities. Doppler parameters are consistent with   abnormal left ventricular relaxation (grade 1 diastolic   dysfunction). - Aortic valve: There was trivial regurgitation. - Mitral valve: There was mild regurgitation. - Pulmonary arteries: PA peak pressure: 31 mm Hg (S).  Stress test: Walked 11 min 21 sec Bruce 13.4 Mets Stress ECG conclusions: There were no stress arrhythmias or   conduction abnormalities. The stress ECG was normal. - Staged echo: There was no echocardiographic evidence for   stress-induced ischemia. - Immediate post stress: RV size was moderately enlarged. RV global   systolic function was mildly to moderately depressed by visual   assessment with a D shaped septum. There was no TR to estimate RV   pressure. - On review of his TTE last week the RV is dilated.   Recent Labs: 03/25/2018: BUN 19; Creatinine, Ser 1.29; Hemoglobin 14.3; Platelets 268.0; Potassium 5.1; Sodium 137  Recent Lipid Panel No results found for: CHOL, TRIG, HDL, CHOLHDL, VLDL, LDLCALC, LDLDIRECT  Physical Exam:    VS:  BP 110/60   Pulse 93   Ht 5\' 7"  (1.702 m)   Wt 167 lb 6.4 oz (75.9 kg)   SpO2 93%   BMI 26.22 kg/m     Wt Readings from Last 3 Encounters:  04/11/18 167 lb 6.4 oz (75.9 kg)  03/25/18 164 lb 3.2 oz (74.5 kg)  03/18/18 164 lb 1.9 oz (74.4 kg)     GEN:  Well nourished, well developed in no acute  distress HEENT: Normal NECK: No JVD; No carotid bruits LYMPHATICS: No lymphadenopathy CARDIAC: RRR, no murmurs, no rubs, no gallops RESPIRATORY:  Clear to auscultation without rales, wheezing or rhonchi  ABDOMEN: Soft, non-tender, non-distended MUSCULOSKELETAL:  No edema; No deformity  SKIN: Warm and dry LOWER EXTREMITIES: no swelling NEUROLOGIC:  Alert and oriented x 3 PSYCHIATRIC:  Normal affect   ASSESSMENT:    1. Dyspnea on exertion  2. Dyslipidemia   3. History of smoking   4. Pulmonary emphysema, unspecified emphysema type ([place])    PLAN:    In order of problems listed above:  1. 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  However I think the best way to approach this problem will be to make sure that his pulmonary issue control the best we can he is scheduled to have pulmonary function test which he need to follow-up with and then I will see him back in my office in about 5 months to see how he does at that time most likely will repeat his echocardiogram to recheck pulmonary artery pressure if pulmonary pressure is elevated we will consider doing cardiac catheterization with hemodynamic cath. 2. Emphysema follow up by pulmonary. 3. Pulmonary adenopathy likely PET scan was negative.  Overall he is very active and exercise on the regular basis I encouraged him to do that   Medication Adjustments/Labs and Tests Ordered: Current medicines are reviewed at length with the patient today.  Concerns regarding medicines are outlined above.  No orders of the defined types were placed in this encounter.  Medication changes: No orders of the defined types were placed in this encounter.   Signed, Park Liter, MD, [facility] 04/11/2018 2:37 PM    [place]

## 2018-04-26 NOTE — Progress Notes (Signed)
Synopsis: Referred in August 2019 for pulmonary infiltrate and dyspnea on exertion by Burnard Bunting, MD  Subjective:   PATIENT ID: Frank Meyer Bang GENDER: male DOB: 1940/11/07, MRN: [id number]  Chief Complaint  Patient presents with  . Follow-up    PFT today, does not feel a difference with anoro. Would like to discuss PET results.     Initial OV: Patient has a past medical history of gastroesophageal reflux disease, TIA, hyperlipidemia.  He complains of dyspnea on exertion which is worse with climbing elevation.  He exercises regularly obtaining 10-15,000 steps per day. Playing golf 2 times per week.   He was recently seen by cardiology for evaluation of dyspnea on exertion.  An echocardiogram was performed on 03/23/2018, with a preserved EF 55-60% and grade 1 diastolic dysfunction.   The patient is a former smoker,  2 ppd, for 30 years, quit in 1992.   He has had SOB for the past 3-6 months. He notices it most with climbing hills while playing golf. No other aggravating symtpoms. Walking on treadmill he has no problem. No chest pain or tightness. Denies cough.  Stopping and resting his symptoms improved.  He also does not experience the symptoms as long as he is not on elevation.  OV 04/27/2018 - Patient was recently seen by cardiology. Stress testing was negative. Echo with enlarged RV consistent with underlying lung disease and possible exercise induced PH. PET scan completed since last office visit. Does have mild uptake in mediastinal and hilar adenopathy. New changes from prior imaging and radiology is recommending close follow up.  At this point the patient has been doing well.  He has noticed no change in respiratory symptoms with the addition of Anoro.  PFTs completed today in the office reveals normal ratio with normal FVC and FEV1.  Patient does not have evidence of obstruction.  He does however have a severely reduced DLCO.  With evidence seen on stress echo for enlargement of  the right ventricle underlying pulmonary hypertension is possible.  This was discussed with the patient in the office today.   Past Medical History:  Diagnosis Date  . Basal cell carcinoma   . Benign prostatic hypertrophy   . Dyslipidemia   . Gallbladder problem   . Gastroesophageal reflux disease   . Hiatal hernia   . Mild memory disturbance   . TIA (transient ischemic attack)      Family History  Problem Relation Age of Onset  . Obesity Mother   . Memory loss Mother      Social History   Socioeconomic History  . Marital status: Married    Spouse name: pamela  . Number of children: 2  . Years of education: college  . Highest education level: Not on file  Occupational History  . Occupation: retired  Scientific laboratory technician  . Financial resource strain: Not on file  . Food insecurity:    Worry: Not on file    Inability: Not on file  . Transportation needs:    Medical: Not on file    Non-medical: Not on file  Tobacco Use  . Smoking status: Former Smoker    Last attempt to quit: 05/04/1979    Years since quitting: 39.0  . Smokeless tobacco: Never Used  Substance and Sexual Activity  . Alcohol use: Yes    Alcohol/week: 15.0 - 20.0 standard drinks    Types: 15 - 20 Cans of beer per week  . Drug use: Yes  . Sexual activity: Not  on file  Lifestyle  . Physical activity:    Days per week: Not on file    Minutes per session: Not on file  . Stress: Not on file  Relationships  . Social connections:    Talks on phone: Not on file    Gets together: Not on file    Attends religious service: Not on file    Active member of club or organization: Not on file    Attends meetings of clubs or organizations: Not on file    Relationship status: Not on file  . Intimate partner violence:    Fear of current or ex partner: Not on file    Emotionally abused: Not on file    Physically abused: Not on file    Forced sexual activity: Not on file  Other Topics Concern  . Not on file  Social  History Narrative   Pt is retired. He is married. The pt lives with his wife. He has a Best boy. The pt has 2 children.      Patient drinks 2-3 cups of caffeine daily.   Patient is right handed.      No Known Allergies   Outpatient Medications Prior to Visit  Medication Sig Dispense Refill  . albuterol (PROVENTIL HFA;VENTOLIN HFA) 108 (90 Base) MCG/ACT inhaler Inhale 2 puffs into the lungs every 6 (six) hours as needed for wheezing or shortness of breath. 1 Inhaler 6  . aspirin 81 MG tablet Take 81 mg by mouth daily.     Marland Kitchen atorvastatin (LIPITOR) 20 MG tablet Take 20 mg by mouth daily.    . cholecalciferol (VITAMIN D) 1000 UNITS tablet Take 1,000 Units by mouth daily.    Marland Kitchen donepezil (ARICEPT) 10 MG tablet Take 1 tablet (10 mg total) by mouth daily. 90 tablet 3  . Fexofenadine-Pseudoephedrine (ALLEGRA-D 24 HOUR PO) Take 1 tablet by mouth daily.    . finasteride (PROSCAR) 5 MG tablet Take 5 mg by mouth daily.    Marland Kitchen ipratropium (ATROVENT) 0.06 % nasal spray     . Multiple Vitamins-Minerals (MULTIVITAMIN ADULTS 50+) TABS Take by mouth.    Marland Kitchen omeprazole (PRILOSEC) 40 MG capsule     . traZODone (DESYREL) 100 MG tablet TAKE 2 TABLETS BY MOUTH AT  BEDTIME 180 tablet 1  . umeclidinium-vilanterol (ANORO ELLIPTA) 62.5-25 MCG/INH AEPB Inhale 1 puff into the lungs daily. 1 each 3  . zaleplon (SONATA) 10 MG capsule Take 1 capsule (10 mg total) by mouth at bedtime as needed for sleep. 90 capsule 0   No facility-administered medications prior to visit.     Review of Systems  Constitutional: Negative for chills, fever, malaise/fatigue and weight loss.  HENT: Negative for hearing loss, sore throat and tinnitus.   Eyes: Negative for blurred vision and double vision.  Respiratory: Positive for shortness of breath (Worse with climbing hills or increased elevation ). Negative for cough, hemoptysis, sputum production, wheezing and stridor.        DOE   Cardiovascular: Negative for chest pain,  palpitations, orthopnea, leg swelling and PND.  Gastrointestinal: Negative for abdominal pain, constipation, diarrhea, heartburn, nausea and vomiting.  Genitourinary: Negative for dysuria, hematuria and urgency.  Musculoskeletal: Negative for joint pain and myalgias.  Skin: Negative for itching and rash.  Neurological: Negative for dizziness, tingling, weakness and headaches.  Endo/Heme/Allergies: Negative for environmental allergies. Does not bruise/bleed easily.  Psychiatric/Behavioral: Negative for depression. The patient is not nervous/anxious and does not have insomnia.   All other systems  reviewed and are negative.    Objective:  Physical Exam  Constitutional: He is oriented to person, place, and time. He appears well-developed and well-nourished. No distress.  HENT:  Head: Normocephalic and atraumatic.  Mouth/Throat: Oropharynx is clear and moist.  Eyes: Pupils are equal, round, and reactive to light. Conjunctivae are normal. No scleral icterus.  Neck: Neck supple. No JVD present. No tracheal deviation present.  Cardiovascular: Normal rate, regular rhythm, normal heart sounds and intact distal pulses.  No murmur heard. Pulmonary/Chest: Effort normal. No accessory muscle usage or stridor. No tachypnea. No respiratory distress. He has no wheezes. He has no rhonchi. He has no rales.  Diminished breath sounds bilaterally  Abdominal: Soft. Bowel sounds are normal. He exhibits no distension. There is no tenderness.  Musculoskeletal: He exhibits no edema or tenderness.  Lymphadenopathy:    He has no cervical adenopathy.  Neurological: He is alert and oriented to person, place, and time.  Skin: Skin is warm and dry. Capillary refill takes less than 2 seconds. No rash noted.  Psychiatric: He has a normal mood and affect. His behavior is normal.  Vitals reviewed.    Vitals:   04/27/18 1025  BP: 112/60  Pulse: 60  SpO2: 96%  Weight: 161 lb (73 kg)  Height: 5\' 6"  (1.676 m)   96%  on RA BMI Readings from Last 3 Encounters:  04/27/18 25.99 kg/m  04/11/18 26.22 kg/m  03/25/18 25.72 kg/m   Wt Readings from Last 3 Encounters:  04/27/18 161 lb (73 kg)  04/11/18 167 lb 6.4 oz (75.9 kg)  03/25/18 164 lb 3.2 oz (74.5 kg)    CBC    Component Value Date/Time   WBC 12.3 (H) 03/25/2018 1205   RBC 5.13 03/25/2018 1205   HGB 14.3 03/25/2018 1205   HCT 44.0 03/25/2018 1205   PLT 268.0 03/25/2018 1205   MCV 85.7 03/25/2018 1205   MCHC 32.5 03/25/2018 1205   RDW 14.6 03/25/2018 1205   LYMPHSABS 2.4 03/25/2018 1205   MONOABS 0.9 03/25/2018 1205   EOSABS 0.4 03/25/2018 1205   BASOSABS 0.1 03/25/2018 1205    New labs pending  Chest Imaging: CT imaging with right upper lobe lesion with associated right hilar and mediastinal adenopathy.  He does have lung nodules within the right lower lobe and left upper lobe.  He has significant centrilobular and paraseptal emphysema.  PET Imaging: IMPRESSION: 1. Low level FDG accumulation in the borderline enlarged mediastinal and right hilar lymph nodes. Nonspecific. 2. Tiny bilateral pulmonary nodules have decreased in the interval. 3. New areas of ground-glass attenuation in both lower lobes showing associated FDG uptake. Disease in the medial left lower lobe is most hypermetabolic were there is an area of somewhat more focal ground-glass density. The ground-glass attenuation is new and or progressive in the interval. Changes may well be infectious/inflammatory, but close follow-up recommended to exclude neoplasm. 4.  Emphysema. (ICD10-J43.9) 5.  Aortic Atherosclerois (ICD10-170.0)  The patient's images have been independently reviewed by me.    Pulmonary Functions Testing Results: 04/27/2018: FEV1 3.2 L, 130% predicted FVC 4.07 L, 118% predicted, postbronchodilator Ratio 78 RV/TLC 61% predicted, DLCO 44% predicted.  FeNO: None  Pathology: None   Echocardiogram:   03/23/2018 Study Conclusions - Left  ventricle: The cavity size was normal. Systolic function was   normal. The estimated ejection fraction was in the range of 55%   to 60%. Wall motion was normal; there were no regional wall   motion abnormalities. Doppler parameters are  consistent with   abnormal left ventricular relaxation (grade 1 diastolic   dysfunction). - Aortic valve: There was trivial regurgitation. - Mitral valve: There was mild regurgitation. - Pulmonary arteries: PA peak pressure: 31 mm Hg (S). Impressions: - Normal LVEF.   Ascending aorte - 4.0 cm   Trace AR.  03/29/2018- Echo Stress - Stress ECG conclusions: There were no stress arrhythmias or   conduction abnormalities. The stress ECG was normal. - Staged echo: There was no echocardiographic evidence for   stress-induced ischemia. - Immediate post stress: RV size was moderately enlarged. RV global   systolic function was mildly to moderately depressed by visual   assessment with a D shaped septum. There was no TR to estimate RV   pressure. - On review of his TTE last week the RV is dilated.  Heart Catheterization: None     Assessment & Plan:   No diagnosis found.  Discussion:  This is a 76 year old gentleman with a significant appearing right upper lobe consolidation with possible area of early cavitation with soft tissue component. He has significant associated emphysema on CT imaging.   PET scan completed with low level uptake within the right upper lobe lesion as well as small paratracheal node.  Additionally has low level uptake from new findings within the left lower lobe.  Radiology recommending close image follow-up to exclude underlying malignancy.  Long discussion with patient in the office to discuss risk versus benefits versus alternatives of proceeding with bronchoscopy and tissue sampling versus follow-up imaging in 2 to 3 months.  Patient has made an informed decision for follow-up imaging.  We will schedule this to be completed within the  first week of November.  If there is any change in size regarding the paratracheal node or change in size or evolution of the left lower lobe or right upper lobe nodule will recommend tissue sampling with biopsy.  Can stop Anoro daily.  Can continue as needed albuterol  Reviewed PFTs in office with patient today.  No evidence of obstruction.  Normal FEV1, normal ratio.  Patient does not meet criteria for COPD however he does have structural emphysema on CT imaging.  Patient does have moderately reduced DLCO.  This could be consistent with his emphysema seen on CAT scan imaging however he does have an enlarged right ventricle on echo with a D-shaped septum concerning for underlying pulmonary hypertension.  We will discuss need for right heart catheterization to evaluate right-sided pulmonary pressures with patient's cardiologist.  This may explain his dyspnea on exertion and possible exercise-induced pH.  Return to clinic in 3 months following repeat imaging.   Current Outpatient Medications:  .  albuterol (PROVENTIL HFA;VENTOLIN HFA) 108 (90 Base) MCG/ACT inhaler, Inhale 2 puffs into the lungs every 6 (six) hours as needed for wheezing or shortness of breath., Disp: 1 Inhaler, Rfl: 6 .  aspirin 81 MG tablet, Take 81 mg by mouth daily. , Disp: , Rfl:  .  atorvastatin (LIPITOR) 20 MG tablet, Take 20 mg by mouth daily., Disp: , Rfl:  .  cholecalciferol (VITAMIN D) 1000 UNITS tablet, Take 1,000 Units by mouth daily., Disp: , Rfl:  .  donepezil (ARICEPT) 10 MG tablet, Take 1 tablet (10 mg total) by mouth daily., Disp: 90 tablet, Rfl: 3 .  Fexofenadine-Pseudoephedrine (ALLEGRA-D 24 HOUR PO), Take 1 tablet by mouth daily., Disp: , Rfl:  .  finasteride (PROSCAR) 5 MG tablet, Take 5 mg by mouth daily., Disp: , Rfl:  .  ipratropium (ATROVENT)  0.06 % nasal spray, , Disp: , Rfl:  .  Multiple Vitamins-Minerals (MULTIVITAMIN ADULTS 50+) TABS, Take by mouth., Disp: , Rfl:  .  omeprazole (PRILOSEC) 40 MG  capsule, , Disp: , Rfl:  .  traZODone (DESYREL) 100 MG tablet, TAKE 2 TABLETS BY MOUTH AT  BEDTIME, Disp: 180 tablet, Rfl: 1 .  umeclidinium-vilanterol (ANORO ELLIPTA) 62.5-25 MCG/INH AEPB, Inhale 1 puff into the lungs daily., Disp: 1 each, Rfl: 3 .  zaleplon (SONATA) 10 MG capsule, Take 1 capsule (10 mg total) by mouth at bedtime as needed for sleep., Disp: 90 capsule, Rfl: 0   Garner Nash, DO [facility] 04/27/2018 11:05 AM

## 2018-04-27 ENCOUNTER — Encounter: Payer: Self-pay | Admitting: Pulmonary Disease

## 2018-04-27 ENCOUNTER — Ambulatory Visit: Payer: Medicare Other | Admitting: Pulmonary Disease

## 2018-04-27 ENCOUNTER — Ambulatory Visit (INDEPENDENT_AMBULATORY_CARE_PROVIDER_SITE_OTHER): Payer: Medicare Other | Admitting: Pulmonary Disease

## 2018-04-27 VITALS — BP 112/60 | HR 60 | Ht 66.0 in | Wt 161.0 lb

## 2018-04-27 DIAGNOSIS — R0609 Other forms of dyspnea: Secondary | ICD-10-CM

## 2018-04-27 DIAGNOSIS — R0602 Shortness of breath: Secondary | ICD-10-CM

## 2018-04-27 DIAGNOSIS — Z87891 Personal history of nicotine dependence: Secondary | ICD-10-CM | POA: Diagnosis not present

## 2018-04-27 DIAGNOSIS — J439 Emphysema, unspecified: Secondary | ICD-10-CM

## 2018-04-27 DIAGNOSIS — R590 Localized enlarged lymph nodes: Secondary | ICD-10-CM

## 2018-04-27 DIAGNOSIS — I5032 Chronic diastolic (congestive) heart failure: Secondary | ICD-10-CM

## 2018-04-27 DIAGNOSIS — R911 Solitary pulmonary nodule: Secondary | ICD-10-CM

## 2018-04-27 LAB — PULMONARY FUNCTION TEST
DL/VA % pred: 49 %
DL/VA: 2.11 ml/min/mmHg/L
DLCO COR % PRED: 44 %
DLCO UNC: 11.81 ml/min/mmHg
DLCO cor: 11.92 ml/min/mmHg
DLCO unc % pred: 44 %
FEF 25-75 POST: 2.75 L/s
FEF 25-75 Pre: 2.92 L/sec
FEF2575-%Change-Post: -5 %
FEF2575-%Pred-Post: 159 %
FEF2575-%Pred-Pre: 168 %
FEV1-%Change-Post: -1 %
FEV1-%PRED-POST: 130 %
FEV1-%PRED-PRE: 131 %
FEV1-POST: 3.2 L
FEV1-PRE: 3.23 L
FEV1FVC-%Change-Post: 0 %
FEV1FVC-%Pred-Pre: 109 %
FEV6-%Change-Post: -1 %
FEV6-%PRED-POST: 126 %
FEV6-%Pred-Pre: 128 %
FEV6-Post: 4.06 L
FEV6-Pre: 4.11 L
FEV6FVC-%CHANGE-POST: 0 %
FEV6FVC-%PRED-POST: 107 %
FEV6FVC-%Pred-Pre: 107 %
FVC-%CHANGE-POST: 0 %
FVC-%Pred-Post: 118 %
FVC-%Pred-Pre: 119 %
FVC-Post: 4.07 L
FVC-Pre: 4.11 L
POST FEV6/FVC RATIO: 100 %
PRE FEV1/FVC RATIO: 79 %
Post FEV1/FVC ratio: 78 %
Pre FEV6/FVC Ratio: 100 %
RV % pred: 51 %
RV: 1.22 L
TLC % PRED: 84 %
TLC: 5.24 L

## 2018-04-27 NOTE — Progress Notes (Signed)
PFT done today. 

## 2018-04-27 NOTE — Patient Instructions (Signed)
Repeat CT chest for follow up 1st week of November.  PFTS with no obstruction, can stop the anoro and continue as needed albuterol.  Follow up with Cardiology regarding elevated right sided heart pressures.  Would consider need for RHC. I will discuss this with your cardiologist. RTC in 3 months following CT images.

## 2018-04-28 ENCOUNTER — Telehealth: Payer: Self-pay | Admitting: Emergency Medicine

## 2018-04-28 NOTE — Telephone Encounter (Signed)
Left message for patient. He needs to be scheduled to talk about pulmonary hypertension. Will schedule when patient returns calls.

## 2018-05-10 ENCOUNTER — Ambulatory Visit: Payer: Medicare Other | Admitting: Cardiology

## 2018-05-17 ENCOUNTER — Encounter: Payer: Self-pay | Admitting: Adult Health

## 2018-05-17 ENCOUNTER — Ambulatory Visit: Payer: Medicare Other | Admitting: Adult Health

## 2018-05-17 VITALS — BP 125/61 | HR 57 | Ht 66.0 in | Wt 164.4 lb

## 2018-05-17 DIAGNOSIS — R413 Other amnesia: Secondary | ICD-10-CM | POA: Diagnosis not present

## 2018-05-17 MED ORDER — ZALEPLON 10 MG PO CAPS: 10.0000 mg | ORAL_CAPSULE | Freq: Every evening | ORAL | 0 refills | Status: DC | PRN

## 2018-05-17 MED ORDER — DONEPEZIL HCL 10 MG PO TABS: 10.0000 mg | ORAL_TABLET | Freq: Every day | ORAL | 3 refills | Status: DC

## 2018-05-17 NOTE — Progress Notes (Signed)
PATIENT: Frank Meyer DOB: Jan 10, 1941  REASON FOR VISIT: follow up HISTORY FROM: patient  HISTORY OF PRESENT ILLNESS: Today 05/17/18:  Frank Meyer is a 77 year old male with a history of memory disturbance.  He returns today for follow-up.  He continues to live at home with his wife.  His wife feels that his memory has gotten slightly worse although he has not seen a difference.  He is able to complete all ADLs independently.  He continues to use his GPS when driving.  He reports that he plays golf twice a week.  He continues to manage all their finances and investments without difficulty.  He reports that he takes trazodone nightly to help with sleep.  He reports on occasion he will have to take Sonata 1-2 times a week if he wakes up and is unable to go back to sleep.  He denies any changes in his mood or behavior.  He continues on Aricept 10 mg at bedtime.  HISTORY Frank Meyer is a 77 year old right-handed white male with a history of a mild memory disturbance. The patient is still functioning at a high level. He is able to operate a motor vehicle but he requires the use of a GPS system. The patient has difficulty remembering names for people, he is also having problems with initiation of tasks. He does have some problems with insomnia, he will take Sonata once or twice a week to help him sleep when he wakes up and middle the night and cannot get back to sleep. The patient takes trazodone at night. The patient has not given up any activities of daily living secondary to his memory issue. The patient returns to this office for an evaluation.  REVIEW OF SYSTEMS: Out of a complete 14 system review of symptoms, the patient complains only of the following symptoms, and all other reviewed systems are negative.  See HPI  ALLERGIES: No Known Allergies  HOME MEDICATIONS: Outpatient Medications Prior to Visit  Medication Sig Dispense Refill  . albuterol (PROVENTIL HFA;VENTOLIN HFA) 108 (90  Base) MCG/ACT inhaler Inhale 2 puffs into the lungs every 6 (six) hours as needed for wheezing or shortness of breath. 1 Inhaler 6  . aspirin 81 MG tablet Take 81 mg by mouth daily.     Marland Kitchen atorvastatin (LIPITOR) 20 MG tablet Take 20 mg by mouth daily.    . cholecalciferol (VITAMIN D) 1000 UNITS tablet Take 1,000 Units by mouth daily.    Marland Kitchen donepezil (ARICEPT) 10 MG tablet Take 1 tablet (10 mg total) by mouth daily. 90 tablet 3  . Fexofenadine-Pseudoephedrine (ALLEGRA-D 24 HOUR PO) Take 1 tablet by mouth daily.    . finasteride (PROSCAR) 5 MG tablet Take 5 mg by mouth daily.    Marland Kitchen ipratropium (ATROVENT) 0.06 % nasal spray     . Multiple Vitamins-Minerals (MULTIVITAMIN ADULTS 50+) TABS Take by mouth.    Marland Kitchen omeprazole (PRILOSEC) 40 MG capsule     . traZODone (DESYREL) 100 MG tablet TAKE 2 TABLETS BY MOUTH AT  BEDTIME 180 tablet 1  . zaleplon (SONATA) 10 MG capsule Take 1 capsule (10 mg total) by mouth at bedtime as needed for sleep. 90 capsule 0  . umeclidinium-vilanterol (ANORO ELLIPTA) 62.5-25 MCG/INH AEPB Inhale 1 puff into the lungs daily. (Patient not taking: Reported on 05/17/2018) 1 each 3   No facility-administered medications prior to visit.     PAST MEDICAL HISTORY: Past Medical History:  Diagnosis Date  . Basal cell carcinoma   .  Benign prostatic hypertrophy   . Dyslipidemia   . Gallbladder problem   . Gastroesophageal reflux disease   . Hiatal hernia   . Mild memory disturbance   . TIA (transient ischemic attack)     PAST SURGICAL HISTORY: Past Surgical History:  Procedure Laterality Date  . none      FAMILY HISTORY: Family History  Problem Relation Age of Onset  . Obesity Mother   . Memory loss Mother     SOCIAL HISTORY: Social History   Socioeconomic History  . Marital status: Married    Spouse name: pamela  . Number of children: 2  . Years of education: college  . Highest education level: Not on file  Occupational History  . Occupation: retired  Photographer  . Financial resource strain: Not on file  . Food insecurity:    Worry: Not on file    Inability: Not on file  . Transportation needs:    Medical: Not on file    Non-medical: Not on file  Tobacco Use  . Smoking status: Former Smoker    Last attempt to quit: 05/04/1979    Years since quitting: 39.0  . Smokeless tobacco: Never Used  Substance and Sexual Activity  . Alcohol use: Yes    Alcohol/week: 15.0 - 20.0 standard drinks    Types: 15 - 20 Cans of beer per week  . Drug use: Yes  . Sexual activity: Not on file  Lifestyle  . Physical activity:    Days per week: Not on file    Minutes per session: Not on file  . Stress: Not on file  Relationships  . Social connections:    Talks on phone: Not on file    Gets together: Not on file    Attends religious service: Not on file    Active member of club or organization: Not on file    Attends meetings of clubs or organizations: Not on file    Relationship status: Not on file  . Intimate partner violence:    Fear of current or ex partner: Not on file    Emotionally abused: Not on file    Physically abused: Not on file    Forced sexual activity: Not on file  Other Topics Concern  . Not on file  Social History Narrative   Pt is retired. He is married. The pt lives with his wife. He has a Best boy. The pt has 2 children.      Patient drinks 2-3 cups of caffeine daily.   Patient is right handed.       PHYSICAL EXAM  Vitals:   05/17/18 1019  BP: 125/61  Pulse: (!) 57  Weight: 164 lb 6.4 oz (74.6 kg)  Height: 5\' 6"  (1.676 m)   Body mass index is 26.53 kg/m.   MMSE - Mini Mental State Exam 05/17/2018 05/11/2017 05/06/2016  Not completed: (No Data) - -  Orientation to time 3 4 4   Orientation to Place 5 5 5   Registration 3 3 3   Attention/ Calculation 5 5 5   Recall 2 3 2   Language- name 2 objects 2 2 2   Language- repeat 1 1 1   Language- follow 3 step command 3 3 2   Language- read & follow direction 1 1 1     Write a sentence 1 1 1   Copy design 1 1 1   Total score 27 29 27      Generalized: Well developed, in no acute distress   Neurological examination  Mentation: Alert oriented to time, place, history taking. Follows all commands speech and language fluent Cranial nerve II-XII: Pupils were equal round reactive to light. Extraocular movements were full, visual field were full on confrontational test. Facial sensation and strength were normal. Uvula tongue midline. Head turning and shoulder shrug  were normal and symmetric. Motor: The motor testing reveals 5 over 5 strength of all 4 extremities. Good symmetric motor tone is noted throughout.  Sensory: Sensory testing is intact to soft touch on all 4 extremities. No evidence of extinction is noted.  Coordination: Cerebellar testing reveals good finger-nose-finger and heel-to-shin bilaterally.  Gait and station: Gait is normal.  Reflexes: Deep tendon reflexes are symmetric and normal bilaterally.   DIAGNOSTIC DATA (LABS, IMAGING, TESTING) - I reviewed patient records, labs, notes, testing and imaging myself where available.  Lab Results  Component Value Date   WBC 12.3 (H) 03/25/2018   HGB 14.3 03/25/2018   HCT 44.0 03/25/2018   MCV 85.7 03/25/2018   PLT 268.0 03/25/2018      Component Value Date/Time   NA 137 03/25/2018 1205   K 5.1 03/25/2018 1205   CL 100 03/25/2018 1205   CO2 32 03/25/2018 1205   GLUCOSE 104 (H) 03/25/2018 1205   BUN 19 03/25/2018 1205   CREATININE 1.29 03/25/2018 1205   CALCIUM 9.4 03/25/2018 1205      ASSESSMENT AND PLAN 77 y.o. year old male  has a past medical history of Basal cell carcinoma, Benign prostatic hypertrophy, Dyslipidemia, Gallbladder problem, Gastroesophageal reflux disease, Hiatal hernia, Mild memory disturbance, and TIA (transient ischemic attack). here with :  1.  Memory disturbance  The patient's memory score has remained relatively stable.  His score today was 27 out of 30 he was  previously 29 out of 30.  His wife is questioning whether an additional medication needs to be added.  Discussed Namenda however the patient deferred.  If he decides he wants to start this in the future he should give [place] a call.  He will follow-up in 1 year or sooner if needed.  I spent 15 minutes with the patient. 50% of this time was spent reviewing his memory for and plan of care   Ward Givens, MSN, NP-C 05/17/2018, 10:25 AM [facility] [address], [place], [place] [address] [phone]

## 2018-05-17 NOTE — Progress Notes (Signed)
I have read the note, and I agree with the clinical assessment and plan.  Frank Meyer   

## 2018-05-17 NOTE — Patient Instructions (Signed)
Your Plan:  Continue Aricept, trazodone and Sonata Consider adding on Namenda If your symptoms worsen or you develop new symptoms please let us know.   Thank you for coming to see [place] at [facility]. I hope we have been able to provide you high quality care today.  You may receive a patient satisfaction survey over the next few weeks. We would appreciate your feedback and comments so that we may continue to improve ourselves and the health of our patients.  Memantine Tablets What is this medicine? MEMANTINE (MEM an teen) is used to treat dementia caused by Alzheimer's disease. This medicine may be used for other purposes; ask your health care provider or pharmacist if you have questions. COMMON BRAND NAME(S): Namenda What should I tell my health care provider before I take this medicine? They need to know if you have any of these conditions: -difficulty passing urine -kidney disease -liver disease -seizures -an unusual or allergic reaction to memantine, other medicines, foods, dyes, or preservatives -pregnant or trying to get pregnant -breast-feeding How should I use this medicine? Take this medicine by mouth with a glass of water. Follow the directions on the prescription label. You may take this medicine with or without food. Take your doses at regular intervals. Do not take your medicine more often than directed. Continue to take your medicine even if you feel better. Do not stop taking except on the advice of your doctor or health care professional. Talk to your pediatrician regarding the use of this medicine in children. Special care may be needed. Overdosage: If you think you have taken too much of this medicine contact a poison control center or emergency room at once. NOTE: This medicine is only for you. Do not share this medicine with others. What if I miss a dose? If you miss a dose, take it as soon as you can. If it is almost time for your next dose, take  only that dose. Do not take double or extra doses. If you do not take your medicine for several days, contact your health care provider. Your dose may need to be changed. What may interact with this medicine? -acetazolamide -amantadine -cimetidine -dextromethorphan -dofetilide -hydrochlorothiazide -ketamine -metformin -methazolamide -quinidine -ranitidine -sodium bicarbonate -triamterene This list may not describe all possible interactions. Give your health care provider a list of all the medicines, herbs, non-prescription drugs, or dietary supplements you use. Also tell them if you smoke, drink alcohol, or use illegal drugs. Some items may interact with your medicine. What should I watch for while using this medicine? Visit your doctor or health care professional for regular checks on your progress. Check with your doctor or health care professional if there is no improvement in your symptoms or if they get worse. You may get drowsy or dizzy. Do not drive, use machinery, or do anything that needs mental alertness until you know how this drug affects you. Do not stand or sit up quickly, especially if you are an older patient. This reduces the risk of dizzy or fainting spells. Alcohol can make you more drowsy and dizzy. Avoid alcoholic drinks. What side effects may I notice from receiving this medicine? Side effects that you should report to your doctor or health care professional as soon as possible: -allergic reactions like skin rash, itching or hives, swelling of the face, lips, or tongue -agitation or a feeling of restlessness -depressed mood -dizziness -hallucinations -redness, blistering, peeling or loosening of the skin, including inside the mouth -seizures -  vomiting Side effects that usually do not require medical attention (report to your doctor or health care professional if they continue or are bothersome): -constipation -diarrhea -headache -nausea -trouble sleeping This  list may not describe all possible side effects. Call your doctor for medical advice about side effects. You may report side effects to FDA at 1-800-FDA-1088. Where should I keep my medicine? Keep out of the reach of children. Store at room temperature between 15 degrees and 30 degrees C (59 degrees and 86 degrees F). Throw away any unused medicine after the expiration date. NOTE: This sheet is a summary. It may not cover all possible information. If you have questions about this medicine, talk to your doctor, pharmacist, or health care provider.  2018 Elsevier/Gold Standard (2013-05-22 14:10:42)

## 2018-05-25 ENCOUNTER — Ambulatory Visit: Payer: Medicare Other | Admitting: Cardiology

## 2018-05-25 ENCOUNTER — Encounter: Payer: Self-pay | Admitting: Cardiology

## 2018-05-25 ENCOUNTER — Telehealth: Payer: Self-pay | Admitting: Pulmonary Disease

## 2018-05-25 VITALS — BP 120/66 | HR 63 | Ht 67.0 in | Wt 164.4 lb

## 2018-05-25 DIAGNOSIS — R0609 Other forms of dyspnea: Secondary | ICD-10-CM | POA: Diagnosis not present

## 2018-05-25 DIAGNOSIS — E785 Hyperlipidemia, unspecified: Secondary | ICD-10-CM

## 2018-05-25 DIAGNOSIS — J439 Emphysema, unspecified: Secondary | ICD-10-CM | POA: Diagnosis not present

## 2018-05-25 NOTE — Telephone Encounter (Signed)
Called and spoke with pt letting him know he could call  CT dept at [facility] at [phone] and they could be able to help reschedule his CT.  Pt expressed understanding. Nothing further needed.

## 2018-05-25 NOTE — Addendum Note (Signed)
Addended by: Linna Hoff R on: 05/25/2018 10:14 AM   Modules accepted: Orders

## 2018-05-25 NOTE — Patient Instructions (Signed)
Medication Instructions:  Your physician recommends that you continue on your current medications as directed. Please refer to the Current Medication list given to you today.  If you need a refill on your cardiac medications before your next appointment, please call your pharmacy.   Lab work: None. If you have labs (blood work) drawn today and your tests are completely normal, you will receive your results only by: Marland Kitchen MyChart Message (if you have MyChart) OR . A paper copy in the mail If you have any lab test that is abnormal or we need to change your treatment, we will call you to review the results.  Testing/Procedures: Your physician has requested that you have an echocardiogram. Echocardiography is a painless test that uses sound waves to create images of your heart. It provides your doctor with information about the size and shape of your heart and how well your heart's chambers and valves are working. This procedure takes approximately one hour. There are no restrictions for this procedure.    Follow-Up: At [facility], you and your health needs are our priority.  As part of our continuing mission to provide you with exceptional heart care, we have created designated Provider Care Teams.  These Care Teams include your primary Cardiologist (physician) and Advanced Practice Providers (APPs -  Physician Assistants and Nurse Practitioners) who all work together to provide you with the care you need, when you need it. You will need a follow up appointment in 2 months.  Please call our office 2 months in advance to schedule this appointment.  You may see Jenne Campus, MD or another member of our [place] Provider Team in [place]: Shirlee More, MD . Jyl Heinz, MD  Any Other Special Instructions Will Be Listed Below (If Applicable).  Echocardiogram An echocardiogram, or echocardiography, uses sound waves (ultrasound) to produce an image of your heart. The echocardiogram is  simple, painless, obtained within a short period of time, and offers valuable information to your health care provider. The images from an echocardiogram can provide information such as:  Evidence of coronary artery disease (CAD).  Heart size.  Heart muscle function.  Heart valve function.  Aneurysm detection.  Evidence of a past heart attack.  Fluid buildup around the heart.  Heart muscle thickening.  Assess heart valve function.  Tell a health care provider about:  Any allergies you have.  All medicines you are taking, including vitamins, herbs, eye drops, creams, and over-the-counter medicines.  Any problems you or family members have had with anesthetic medicines.  Any blood disorders you have.  Any surgeries you have had.  Any medical conditions you have.  Whether you are pregnant or may be pregnant. What happens before the procedure? No special preparation is needed. Eat and drink normally. What happens during the procedure?  In order to produce an image of your heart, gel will be applied to your chest and a wand-like tool (transducer) will be moved over your chest. The gel will help transmit the sound waves from the transducer. The sound waves will harmlessly bounce off your heart to allow the heart images to be captured in real-time motion. These images will then be recorded.  You may need an IV to receive a medicine that improves the quality of the pictures. What happens after the procedure? You may return to your normal schedule including diet, activities, and medicines, unless your health care provider tells you otherwise. This information is not intended to replace advice given to you  by your health care provider. Make sure you discuss any questions you have with your health care provider. Document Released: 07/31/2000 Document Revised: 03/21/2016 Document Reviewed: 04/10/2013 Elsevier Interactive Patient Education  2017 Elsevier Inc.    

## 2018-05-25 NOTE — Progress Notes (Signed)
Cardiology Office Note:    Date:  05/25/2018   ID:  Frank Meyer, DOB 04-24-1941, MRN [id number]  PCP:  Burnard Bunting, MD  Cardiologist:  Jenne Campus, MD    Referring MD: Burnard Bunting, MD   Chief Complaint  Patient presents with  . Follow up on testing  Still shortness of breath with exertion  History of Present Illness:    Frank Meyer is a 77 y.o. male with shortness of breath on exertion.  He does have some chronic lung condition and that being aggressively pursued by pulmonary however what is concerning is the fact that when he was having stress test done his right ventricle enlarged during exercise.  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  I spoke in length to him about what to do with the situation actually his wife was present during the visit as well.  We reach conclusion that we will repeat noninvasive test which is echocardiogram to look at pulmonary pressure and then if that will be unrevealing we will proceed with the right side cardiac catheterization.  I will also talk to our pulmonary hypertension team to help him with this decision he may require cardiac catheterization with exercise.  Past Medical History:  Diagnosis Date  . Basal cell carcinoma   . Benign prostatic hypertrophy   . Dyslipidemia   . Gallbladder problem   . Gastroesophageal reflux disease   . Hiatal hernia   . Mild memory disturbance   . TIA (transient ischemic attack)     Past Surgical History:  Procedure Laterality Date  . none      Current Medications: Current Meds  Medication Sig  . albuterol (PROVENTIL HFA;VENTOLIN HFA) 108 (90 Base) MCG/ACT inhaler Inhale 2 puffs into the lungs every 6 (six) hours as needed for wheezing or shortness of breath.  Marland Kitchen aspirin 81 MG tablet Take 81 mg by mouth daily.   Marland Kitchen atorvastatin (LIPITOR) 20 MG tablet Take 20 mg by mouth daily.  . cholecalciferol (VITAMIN D) 1000 UNITS tablet Take 1,000 Units by mouth daily.    Marland Kitchen donepezil (ARICEPT) 10 MG tablet Take 1 tablet (10 mg total) by mouth daily.  Marland Kitchen Fexofenadine-Pseudoephedrine (ALLEGRA-D 24 HOUR PO) Take 1 tablet by mouth daily.  . finasteride (PROSCAR) 5 MG tablet Take 5 mg by mouth daily.  Marland Kitchen ipratropium (ATROVENT) 0.06 % nasal spray   . Multiple Vitamins-Minerals (MULTIVITAMIN ADULTS 50+) TABS Take by mouth.  Marland Kitchen omeprazole (PRILOSEC) 40 MG capsule   . traZODone (DESYREL) 100 MG tablet TAKE 2 TABLETS BY MOUTH AT  BEDTIME  . zaleplon (SONATA) 10 MG capsule Take 1 capsule (10 mg total) by mouth at bedtime as needed for sleep.     Allergies:   Patient has no known allergies.   Social History   Socioeconomic History  . Marital status: Married    Spouse name: pamela  . Number of children: 2  . Years of education: college  . Highest education level: Not on file  Occupational History  . Occupation: retired  Scientific laboratory technician  . Financial resource strain: Not on file  . Food insecurity:    Worry: Not on file    Inability: Not on file  . Transportation needs:    Medical: Not on file    Non-medical: Not on file  Tobacco Use  . Smoking status: Former Smoker    Last attempt to quit: 05/04/1979    Years since quitting: 39.0  . Smokeless tobacco: Never Used  Substance and Sexual  Activity  . Alcohol use: Yes    Alcohol/week: 15.0 - 20.0 standard drinks    Types: 15 - 20 Cans of beer per week  . Drug use: Yes  . Sexual activity: Not on file  Lifestyle  . Physical activity:    Days per week: Not on file    Minutes per session: Not on file  . Stress: Not on file  Relationships  . Social connections:    Talks on phone: Not on file    Gets together: Not on file    Attends religious service: Not on file    Active member of club or organization: Not on file    Attends meetings of clubs or organizations: Not on file    Relationship status: Not on file  Other Topics Concern  . Not on file  Social History Narrative   Pt is retired. He is married. The  pt lives with his wife. He has a Best boy. The pt has 2 children.      Patient drinks 2-3 cups of caffeine daily.   Patient is right handed.      Family History: The patient's family history includes Memory loss in his mother; Obesity in his mother. ROS:   Please see the history of present illness.    All 14 point review of systems negative except as described per history of present illness  EKGs/Labs/Other Studies Reviewed:      Recent Labs: 03/25/2018: BUN 19; Creatinine, Ser 1.29; Hemoglobin 14.3; Platelets 268.0; Potassium 5.1; Sodium 137  Recent Lipid Panel No results found for: CHOL, TRIG, HDL, CHOLHDL, VLDL, LDLCALC, LDLDIRECT  Physical Exam:    VS:  BP 120/66   Pulse 63   Ht 5\' 7"  (1.702 m)   Wt 164 lb 6.4 oz (74.6 kg)   SpO2 95%   BMI 25.75 kg/m     Wt Readings from Last 3 Encounters:  05/25/18 164 lb 6.4 oz (74.6 kg)  05/17/18 164 lb 6.4 oz (74.6 kg)  04/27/18 161 lb (73 kg)     GEN:  Well nourished, well developed in no acute distress HEENT: Normal NECK: No JVD; No carotid bruits LYMPHATICS: No lymphadenopathy CARDIAC: RRR, no murmurs, no rubs, no gallops RESPIRATORY:  Clear to auscultation without rales, wheezing or rhonchi  ABDOMEN: Soft, non-tender, non-distended MUSCULOSKELETAL:  No edema; No deformity  SKIN: Warm and dry LOWER EXTREMITIES: no swelling NEUROLOGIC:  Alert and oriented x 3 PSYCHIATRIC:  Normal affect   ASSESSMENT:    1. Dyspnea on exertion   2. Dyslipidemia   3. Pulmonary emphysema, unspecified emphysema type ([place])    PLAN:    In order of problems listed above:  1. Dyspnea on exertion plan as outlined above he is being followed by pulmonary. 2. Dyslipidemia on atorvastatin which I will continue. 3. Pulmonary emphysema: Followed by pulmonary.  Plan is to repeat echocardiogram and if that is unrevealing proceed with cardiac catheterization I explained procedure to him as well as to his wife he is willing to proceed if  echocardiogram will be not helpful.   Medication Adjustments/Labs and Tests Ordered: Current medicines are reviewed at length with the patient today.  Concerns regarding medicines are outlined above.  No orders of the defined types were placed in this encounter.  Medication changes: No orders of the defined types were placed in this encounter.   Signed, Park Liter, MD, [facility] 05/25/2018 10:01 AM    [place]

## 2018-06-03 ENCOUNTER — Ambulatory Visit (HOSPITAL_BASED_OUTPATIENT_CLINIC_OR_DEPARTMENT_OTHER)
Admission: RE | Admit: 2018-06-03 | Discharge: 2018-06-03 | Disposition: A | Discharge status: Home / Self Care | Payer: Medicare Other | Source: Ambulatory Visit | Attending: Cardiology | Admitting: Cardiology

## 2018-06-03 DIAGNOSIS — I5189 Other ill-defined heart diseases: Secondary | ICD-10-CM | POA: Diagnosis not present

## 2018-06-03 DIAGNOSIS — E785 Hyperlipidemia, unspecified: Secondary | ICD-10-CM | POA: Diagnosis not present

## 2018-06-03 DIAGNOSIS — R0600 Dyspnea, unspecified: Secondary | ICD-10-CM | POA: Diagnosis not present

## 2018-06-03 DIAGNOSIS — I351 Nonrheumatic aortic (valve) insufficiency: Secondary | ICD-10-CM | POA: Insufficient documentation

## 2018-06-03 DIAGNOSIS — Z8673 Personal history of transient ischemic attack (TIA), and cerebral infarction without residual deficits: Secondary | ICD-10-CM | POA: Diagnosis not present

## 2018-06-03 DIAGNOSIS — R0609 Other forms of dyspnea: Secondary | ICD-10-CM | POA: Diagnosis not present

## 2018-06-03 NOTE — Progress Notes (Signed)
  Echocardiogram 2D Echocardiogram has been performed.  Frank Meyer 06/03/2018, 10:36 AM

## 2018-06-21 ENCOUNTER — Ambulatory Visit (HOSPITAL_BASED_OUTPATIENT_CLINIC_OR_DEPARTMENT_OTHER)
Admission: RE | Admit: 2018-06-21 | Discharge: 2018-06-21 | Disposition: A | Discharge status: Home / Self Care | Payer: Medicare Other | Source: Ambulatory Visit | Attending: Pulmonary Disease | Admitting: Pulmonary Disease

## 2018-06-21 DIAGNOSIS — R918 Other nonspecific abnormal finding of lung field: Secondary | ICD-10-CM | POA: Insufficient documentation

## 2018-06-21 DIAGNOSIS — J438 Other emphysema: Secondary | ICD-10-CM | POA: Diagnosis not present

## 2018-06-21 DIAGNOSIS — I700 Atherosclerosis of aorta: Secondary | ICD-10-CM | POA: Insufficient documentation

## 2018-06-21 DIAGNOSIS — R0602 Shortness of breath: Secondary | ICD-10-CM | POA: Diagnosis present

## 2018-06-21 DIAGNOSIS — R590 Localized enlarged lymph nodes: Secondary | ICD-10-CM | POA: Insufficient documentation

## 2018-06-21 DIAGNOSIS — I712 Thoracic aortic aneurysm, without rupture: Secondary | ICD-10-CM | POA: Insufficient documentation

## 2018-06-21 DIAGNOSIS — I517 Cardiomegaly: Secondary | ICD-10-CM | POA: Diagnosis not present

## 2018-06-21 DIAGNOSIS — I2510 Atherosclerotic heart disease of native coronary artery without angina pectoris: Secondary | ICD-10-CM | POA: Insufficient documentation

## 2018-06-21 DIAGNOSIS — J432 Centrilobular emphysema: Secondary | ICD-10-CM | POA: Diagnosis not present

## 2018-06-22 ENCOUNTER — Other Ambulatory Visit (HOSPITAL_BASED_OUTPATIENT_CLINIC_OR_DEPARTMENT_OTHER): Payer: Medicare Other

## 2018-06-29 NOTE — Progress Notes (Signed)
LMTCB

## 2018-07-08 ENCOUNTER — Telehealth: Payer: Self-pay

## 2018-07-08 DIAGNOSIS — R911 Solitary pulmonary nodule: Secondary | ICD-10-CM

## 2018-07-08 NOTE — Telephone Encounter (Signed)
LMTCB  Patient needs to be rescheduled. BI is doing a bronch 12.11.19 and patient needs to be rescheduled that afternoon or whenever is convenient for him.

## 2018-07-12 NOTE — Telephone Encounter (Signed)
Patient returned call.  He asked that I cancel the appointment for 12/11 and ask the nurse if he needs to reschedule this or wait for a 6 month follow up.  He states when he was given his results, he was told to follow back up in 6 months.  CB is [phone]

## 2018-07-12 NOTE — Telephone Encounter (Signed)
[place] - can you advise on this? Thanks.

## 2018-07-12 NOTE — Telephone Encounter (Signed)
Called and spoke to patient, made patient aware his next Chest CT is not due until May 2020. Patient voiced understanding and states he would like to cancel his appt for now and he would f/u in May 2020. I informed patient that if he needed to be seen before then please give [place] a call. Nothing further is needed at this time.

## 2018-07-27 ENCOUNTER — Ambulatory Visit: Payer: Medicare Other | Admitting: Pulmonary Disease

## 2018-08-02 ENCOUNTER — Ambulatory Visit: Payer: Medicare Other | Admitting: Cardiology

## 2018-08-02 ENCOUNTER — Telehealth: Payer: Self-pay | Admitting: Pulmonary Disease

## 2018-08-02 ENCOUNTER — Ambulatory Visit (INDEPENDENT_AMBULATORY_CARE_PROVIDER_SITE_OTHER): Payer: Medicare Other | Admitting: Cardiology

## 2018-08-02 VITALS — BP 120/62 | HR 59 | Ht 67.0 in | Wt 160.1 lb

## 2018-08-02 DIAGNOSIS — Z87891 Personal history of nicotine dependence: Secondary | ICD-10-CM | POA: Diagnosis not present

## 2018-08-02 DIAGNOSIS — R0609 Other forms of dyspnea: Secondary | ICD-10-CM | POA: Diagnosis not present

## 2018-08-02 DIAGNOSIS — J439 Emphysema, unspecified: Secondary | ICD-10-CM | POA: Diagnosis not present

## 2018-08-02 NOTE — Telephone Encounter (Signed)
Pt called to say he needs surgical clearance sent to Dr. Perley Jain office Fax [phone] for colonoscopy and endoscopy and pt would like call back when done.

## 2018-08-02 NOTE — Telephone Encounter (Signed)
Called Dr. Perley Jain office they state they needed to schedule patient before sending the form for clearance. They also stated that they will contact patient about further need for clearance. I informed patient of this.

## 2018-08-02 NOTE — Telephone Encounter (Signed)
Called and spoke with the patient letting him know we have not received clearance paperwork at this time and we would need this in order for BI to even look at this, he stated he would talk to  The other  office and have it sent nothing further needed at this time.

## 2018-08-02 NOTE — Patient Instructions (Signed)
Medication Instructions:  Your physician recommends that you continue on your current medications as directed. Please refer to the Current Medication list given to you today.  If you need a refill on your cardiac medications before your next appointment, please call your pharmacy.   Lab work: None ordered If you have labs (blood work) drawn today and your tests are completely normal, you will receive your results only by: Marland Kitchen MyChart Message (if you have MyChart) OR . A paper copy in the mail If you have any lab test that is abnormal or we need to change your treatment, we will call you to review the results.  Testing/Procedures: None ordered  Follow-Up: At [facility], you and your health needs are our priority.  As part of our continuing mission to provide you with exceptional heart care, we have created designated Provider Care Teams.  These Care Teams include your primary Cardiologist (physician) and Advanced Practice Providers (APPs -  Physician Assistants and Nurse Practitioners) who all work together to provide you with the care you need, when you need it. You will need a follow up appointment in 4 months.  Please call our office 2 months in advance to schedule this appointment.  You may see Jenne Campus, MD or another member of our [place] Provider Team in [place]: Shirlee More, MD . Jyl Heinz, MD  Any Other Special Instructions Will Be Listed Below (If Applicable).

## 2018-08-02 NOTE — Progress Notes (Signed)
Cardiology Office Note:    Date:  08/02/2018   ID:  [name], DOB 1941/05/18, MRN [id number]  PCP:  Burnard Bunting, MD  Cardiologist:  Jenne Campus, MD    Referring MD: Burnard Bunting, MD   Chief Complaint  Patient presents with  . 2 month follow up  Doing well  History of Present Illness:    Adonus LANEY BAGSHAW is a 77 y.o. male who I am evaluating for possibility of pulmonary hypertension he does have mild enlargement of the right ventricle but multiple echocardiogram showed normal pulmonary artery pressure.  We had a long discussion about what to do with the situation overall clinically he is doing well he exercised on the regular basis he walked his dog he walked up he will get a little short of breath but nothing extraordinary therefore we can keep watching the situation he is losing weight and that is obviously concern previously he was scheduled to have colonoscopy and upper GI which apparently was canceled because of potential heart problem from my standpoint review we can proceed with those procedures He does not have any palpitations no swelling of lower extremities no chest tightness. Past Medical History:  Diagnosis Date  . Basal cell carcinoma   . Benign prostatic hypertrophy   . Dyslipidemia   . Gallbladder problem   . Gastroesophageal reflux disease   . Hiatal hernia   . Mild memory disturbance   . TIA (transient ischemic attack)     Past Surgical History:  Procedure Laterality Date  . none      Current Medications: Current Meds  Medication Sig  . albuterol (PROVENTIL HFA;VENTOLIN HFA) 108 (90 Base) MCG/ACT inhaler Inhale 2 puffs into the lungs every 6 (six) hours as needed for wheezing or shortness of breath.  Marland Kitchen aspirin 81 MG tablet Take 81 mg by mouth daily.   Marland Kitchen atorvastatin (LIPITOR) 20 MG tablet Take 20 mg by mouth daily.  . cholecalciferol (VITAMIN D) 1000 UNITS tablet Take 1,000 Units by mouth daily.  Marland Kitchen donepezil (ARICEPT) 10 MG tablet  Take 1 tablet (10 mg total) by mouth daily.  Marland Kitchen Fexofenadine-Pseudoephedrine (ALLEGRA-D 24 HOUR PO) Take 1 tablet by mouth daily.  . finasteride (PROSCAR) 5 MG tablet Take 5 mg by mouth daily.  Marland Kitchen ipratropium (ATROVENT) 0.06 % nasal spray   . Multiple Vitamins-Minerals (MULTIVITAMIN ADULTS 50+) TABS Take by mouth.  Marland Kitchen omeprazole (PRILOSEC) 40 MG capsule   . traZODone (DESYREL) 100 MG tablet TAKE 2 TABLETS BY MOUTH AT  BEDTIME  . zaleplon (SONATA) 10 MG capsule Take 1 capsule (10 mg total) by mouth at bedtime as needed for sleep.     Allergies:   Patient has no known allergies.   Social History   Socioeconomic History  . Marital status: Married    Spouse name: pamela  . Number of children: 2  . Years of education: college  . Highest education level: Not on file  Occupational History  . Occupation: retired  Scientific laboratory technician  . Financial resource strain: Not on file  . Food insecurity:    Worry: Not on file    Inability: Not on file  . Transportation needs:    Medical: Not on file    Non-medical: Not on file  Tobacco Use  . Smoking status: Former Smoker    Last attempt to quit: 05/04/1979    Years since quitting: 39.2  . Smokeless tobacco: Never Used  Substance and Sexual Activity  . Alcohol use: Yes  Alcohol/week: 15.0 - 20.0 standard drinks    Types: 15 - 20 Cans of beer per week  . Drug use: Yes  . Sexual activity: Not on file  Lifestyle  . Physical activity:    Days per week: Not on file    Minutes per session: Not on file  . Stress: Not on file  Relationships  . Social connections:    Talks on phone: Not on file    Gets together: Not on file    Attends religious service: Not on file    Active member of club or organization: Not on file    Attends meetings of clubs or organizations: Not on file    Relationship status: Not on file  Other Topics Concern  . Not on file  Social History Narrative   Pt is retired. He is married. The pt lives with his wife. He has a  Best boy. The pt has 2 children.      Patient drinks 2-3 cups of caffeine daily.   Patient is right handed.      Family History: The patient's family history includes Memory loss in his mother; Obesity in his mother. ROS:   Please see the history of present illness.    All 14 point review of systems negative except as described per history of present illness  EKGs/Labs/Other Studies Reviewed:      Recent Labs: 03/25/2018: BUN 19; Creatinine, Ser 1.29; Hemoglobin 14.3; Platelets 268.0; Potassium 5.1; Sodium 137  Recent Lipid Panel No results found for: CHOL, TRIG, HDL, CHOLHDL, VLDL, LDLCALC, LDLDIRECT  Physical Exam:    VS:  BP 120/62   Pulse (!) 59   Ht 5\' 7"  (1.702 m)   Wt 160 lb 1.9 oz (72.6 kg)   SpO2 92%   BMI 25.08 kg/m     Wt Readings from Last 3 Encounters:  08/02/18 160 lb 1.9 oz (72.6 kg)  05/25/18 164 lb 6.4 oz (74.6 kg)  05/17/18 164 lb 6.4 oz (74.6 kg)     GEN:  Well nourished, well developed in no acute distress HEENT: Normal NECK: No JVD; No carotid bruits LYMPHATICS: No lymphadenopathy CARDIAC: RRR, no murmurs, no rubs, no gallops RESPIRATORY:  Clear to auscultation without rales, wheezing or rhonchi  ABDOMEN: Soft, non-tender, non-distended MUSCULOSKELETAL:  No edema; No deformity  SKIN: Warm and dry LOWER EXTREMITIES: no swelling NEUROLOGIC:  Alert and oriented x 3 PSYCHIATRIC:  Normal affect   ASSESSMENT:    1. Dyspnea on exertion   2. History of smoking   3. Pulmonary emphysema, unspecified emphysema type ([place])    PLAN:    In order of problems listed above:  1. Dyspnea on exertion.  Multifactorial.  Pulmonary pressures normal mild right ventricle enlargement.  We will continue monitoring. 2. History of smoking noted. 3. Pulmonary emphysema followed by pulmonary team.   Medication Adjustments/Labs and Tests Ordered: Current medicines are reviewed at length with the patient today.  Concerns regarding medicines are outlined  above.  No orders of the defined types were placed in this encounter.  Medication changes: No orders of the defined types were placed in this encounter.   Signed, Park Liter, MD, [facility] 08/02/2018 11:51 AM    [place]

## 2018-08-03 ENCOUNTER — Other Ambulatory Visit: Payer: Self-pay | Admitting: Neurology

## 2018-08-03 ENCOUNTER — Encounter: Payer: Self-pay | Admitting: Cardiology

## 2018-09-05 ENCOUNTER — Other Ambulatory Visit: Payer: Self-pay | Admitting: Neurology

## 2018-09-14 DIAGNOSIS — E663 Overweight: Secondary | ICD-10-CM | POA: Insufficient documentation

## 2018-09-14 HISTORY — DX: Overweight: E66.3

## 2018-11-30 ENCOUNTER — Telehealth: Payer: Self-pay | Admitting: Cardiology

## 2018-11-30 NOTE — Telephone Encounter (Signed)
Left voicemail to return call about virtual visit °

## 2018-12-06 ENCOUNTER — Telehealth: Payer: Medicare Other | Admitting: Cardiology

## 2018-12-06 ENCOUNTER — Ambulatory Visit: Payer: Medicare Other | Admitting: Cardiology

## 2018-12-12 ENCOUNTER — Emergency Department (HOSPITAL_COMMUNITY): Payer: Medicare Other

## 2018-12-12 ENCOUNTER — Observation Stay (HOSPITAL_COMMUNITY): Payer: Medicare Other

## 2018-12-12 ENCOUNTER — Observation Stay (HOSPITAL_COMMUNITY)
Admission: EM | Admit: 2018-12-12 | Discharge: 2018-12-12 | Disposition: A | Discharge status: Home / Self Care | Payer: Medicare Other | Attending: Internal Medicine | Admitting: Internal Medicine

## 2018-12-12 ENCOUNTER — Observation Stay (HOSPITAL_BASED_OUTPATIENT_CLINIC_OR_DEPARTMENT_OTHER): Payer: Medicare Other

## 2018-12-12 ENCOUNTER — Encounter (HOSPITAL_COMMUNITY): Payer: Self-pay | Admitting: Internal Medicine

## 2018-12-12 ENCOUNTER — Other Ambulatory Visit: Payer: Self-pay

## 2018-12-12 DIAGNOSIS — E785 Hyperlipidemia, unspecified: Secondary | ICD-10-CM | POA: Diagnosis not present

## 2018-12-12 DIAGNOSIS — I351 Nonrheumatic aortic (valve) insufficiency: Secondary | ICD-10-CM | POA: Insufficient documentation

## 2018-12-12 DIAGNOSIS — R55 Syncope and collapse: Secondary | ICD-10-CM

## 2018-12-12 DIAGNOSIS — F0390 Unspecified dementia without behavioral disturbance: Secondary | ICD-10-CM | POA: Diagnosis not present

## 2018-12-12 DIAGNOSIS — Z8673 Personal history of transient ischemic attack (TIA), and cerebral infarction without residual deficits: Secondary | ICD-10-CM | POA: Diagnosis not present

## 2018-12-12 DIAGNOSIS — Z85828 Personal history of other malignant neoplasm of skin: Secondary | ICD-10-CM | POA: Insufficient documentation

## 2018-12-12 DIAGNOSIS — R001 Bradycardia, unspecified: Secondary | ICD-10-CM | POA: Diagnosis present

## 2018-12-12 DIAGNOSIS — R4020 Unspecified coma: Secondary | ICD-10-CM | POA: Diagnosis present

## 2018-12-12 DIAGNOSIS — G4700 Insomnia, unspecified: Secondary | ICD-10-CM | POA: Diagnosis not present

## 2018-12-12 DIAGNOSIS — Z7982 Long term (current) use of aspirin: Secondary | ICD-10-CM | POA: Diagnosis not present

## 2018-12-12 DIAGNOSIS — Z7951 Long term (current) use of inhaled steroids: Secondary | ICD-10-CM | POA: Insufficient documentation

## 2018-12-12 DIAGNOSIS — Z79899 Other long term (current) drug therapy: Secondary | ICD-10-CM | POA: Insufficient documentation

## 2018-12-12 DIAGNOSIS — K219 Gastro-esophageal reflux disease without esophagitis: Secondary | ICD-10-CM | POA: Diagnosis not present

## 2018-12-12 DIAGNOSIS — J431 Panlobular emphysema: Secondary | ICD-10-CM | POA: Diagnosis present

## 2018-12-12 DIAGNOSIS — N400 Enlarged prostate without lower urinary tract symptoms: Secondary | ICD-10-CM | POA: Diagnosis not present

## 2018-12-12 DIAGNOSIS — J439 Emphysema, unspecified: Secondary | ICD-10-CM | POA: Diagnosis not present

## 2018-12-12 DIAGNOSIS — Z87891 Personal history of nicotine dependence: Secondary | ICD-10-CM | POA: Insufficient documentation

## 2018-12-12 DIAGNOSIS — R413 Other amnesia: Secondary | ICD-10-CM | POA: Diagnosis present

## 2018-12-12 HISTORY — DX: Syncope and collapse: R55

## 2018-12-12 LAB — URINALYSIS, ROUTINE W REFLEX MICROSCOPIC
Bilirubin Urine: NEGATIVE
Glucose, UA: NEGATIVE mg/dL
Hgb urine dipstick: NEGATIVE
Ketones, ur: NEGATIVE mg/dL
Leukocytes,Ua: NEGATIVE
Nitrite: NEGATIVE
Protein, ur: NEGATIVE mg/dL
Specific Gravity, Urine: 1.012 (ref 1.005–1.030)
pH: 6 (ref 5.0–8.0)

## 2018-12-12 LAB — CBC
HCT: 42 % (ref 39.0–52.0)
Hemoglobin: 13.7 g/dL (ref 13.0–17.0)
MCH: 29.1 pg (ref 26.0–34.0)
MCHC: 32.6 g/dL (ref 30.0–36.0)
MCV: 89.2 fL (ref 80.0–100.0)
Platelets: 202 10*3/uL (ref 150–400)
RBC: 4.71 MIL/uL (ref 4.22–5.81)
RDW: 13.2 % (ref 11.5–15.5)
WBC: 7.4 10*3/uL (ref 4.0–10.5)
nRBC: 0 % (ref 0.0–0.2)

## 2018-12-12 LAB — CBC WITH DIFFERENTIAL/PLATELET
Abs Immature Granulocytes: 0.03 10*3/uL (ref 0.00–0.07)
Basophils Absolute: 0.1 10*3/uL (ref 0.0–0.1)
Basophils Relative: 1 %
Eosinophils Absolute: 0.6 10*3/uL — ABNORMAL HIGH (ref 0.0–0.5)
Eosinophils Relative: 8 %
HCT: 42.5 % (ref 39.0–52.0)
Hemoglobin: 13.6 g/dL (ref 13.0–17.0)
Immature Granulocytes: 0 %
Lymphocytes Relative: 28 %
Lymphs Abs: 2 10*3/uL (ref 0.7–4.0)
MCH: 28.5 pg (ref 26.0–34.0)
MCHC: 32 g/dL (ref 30.0–36.0)
MCV: 89.1 fL (ref 80.0–100.0)
Monocytes Absolute: 0.7 10*3/uL (ref 0.1–1.0)
Monocytes Relative: 10 %
Neutro Abs: 3.8 10*3/uL (ref 1.7–7.7)
Neutrophils Relative %: 53 %
Platelets: 209 10*3/uL (ref 150–400)
RBC: 4.77 MIL/uL (ref 4.22–5.81)
RDW: 13.1 % (ref 11.5–15.5)
WBC: 7.2 10*3/uL (ref 4.0–10.5)
nRBC: 0 % (ref 0.0–0.2)

## 2018-12-12 LAB — BASIC METABOLIC PANEL
Anion gap: 10 (ref 5–15)
BUN: 16 mg/dL (ref 8–23)
CO2: 25 mmol/L (ref 22–32)
Calcium: 8.5 mg/dL — ABNORMAL LOW (ref 8.9–10.3)
Chloride: 103 mmol/L (ref 98–111)
Creatinine, Ser: 1.1 mg/dL (ref 0.61–1.24)
GFR calc Af Amer: >60 mL/min (ref >60)
GFR calc non Af Amer: >60 mL/min (ref >60)
Glucose, Bld: 98 mg/dL (ref 70–99)
Potassium: 3.9 mmol/L (ref 3.5–5.1)
Sodium: 138 mmol/L (ref 135–145)

## 2018-12-12 LAB — RAPID URINE DRUG SCREEN, HOSP PERFORMED
Amphetamines: NOT DETECTED
Barbiturates: NOT DETECTED
Benzodiazepines: NOT DETECTED
Cocaine: NOT DETECTED
Opiates: NOT DETECTED
Tetrahydrocannabinol: NOT DETECTED

## 2018-12-12 LAB — COMPREHENSIVE METABOLIC PANEL
ALT: 30 U/L (ref 0–44)
AST: 32 U/L (ref 15–41)
Albumin: 3.1 g/dL — ABNORMAL LOW (ref 3.5–5.0)
Alkaline Phosphatase: 54 U/L (ref 38–126)
Anion gap: 11 (ref 5–15)
BUN: 17 mg/dL (ref 8–23)
CO2: 24 mmol/L (ref 22–32)
Calcium: 8.6 mg/dL — ABNORMAL LOW (ref 8.9–10.3)
Chloride: 102 mmol/L (ref 98–111)
Creatinine, Ser: 1.11 mg/dL (ref 0.61–1.24)
GFR calc Af Amer: >60 mL/min (ref >60)
GFR calc non Af Amer: >60 mL/min (ref >60)
Glucose, Bld: 103 mg/dL — ABNORMAL HIGH (ref 70–99)
Potassium: 3.9 mmol/L (ref 3.5–5.1)
Sodium: 137 mmol/L (ref 135–145)
Total Bilirubin: 0.6 mg/dL (ref 0.3–1.2)
Total Protein: 6.4 g/dL — ABNORMAL LOW (ref 6.5–8.1)

## 2018-12-12 LAB — ECHOCARDIOGRAM COMPLETE
Height: 67 in
Weight: 2680.79 oz

## 2018-12-12 LAB — TROPONIN I
Troponin I: <0.03 ng/mL (ref <0.03)
Troponin I: <0.03 ng/mL (ref <0.03)
Troponin I: <0.03 ng/mL (ref <0.03)
Troponin I: <0.03 ng/mL (ref <0.03)

## 2018-12-12 LAB — CBG MONITORING, ED: Glucose-Capillary: 100 mg/dL — ABNORMAL HIGH (ref 70–99)

## 2018-12-12 LAB — APTT: aPTT: 42 seconds — ABNORMAL HIGH (ref 24–36)

## 2018-12-12 LAB — MAGNESIUM: Magnesium: 2.3 mg/dL (ref 1.7–2.4)

## 2018-12-12 LAB — PROTIME-INR
INR: 1 (ref 0.8–1.2)
Prothrombin Time: 12.7 seconds (ref 11.4–15.2)

## 2018-12-12 LAB — I-STAT CREATININE, ED: Creatinine, Ser: 1 mg/dL (ref 0.61–1.24)

## 2018-12-12 LAB — TSH: TSH: 1.89 u[IU]/mL (ref 0.350–4.500)

## 2018-12-12 MED ORDER — ENOXAPARIN SODIUM 40 MG/0.4ML ~~LOC~~ SOLN
40.0000 mg | Freq: Every day | SUBCUTANEOUS | Status: DC
  Filled 2018-12-12 (×2): qty 0.4

## 2018-12-12 MED ORDER — THIAMINE HCL 100 MG PO TABS: 100.0000 mg | ORAL_TABLET | Freq: Every day | ORAL | 0 refills | Status: DC

## 2018-12-12 MED ORDER — ATORVASTATIN CALCIUM 10 MG PO TABS
20.0000 mg | ORAL_TABLET | Freq: Every day | ORAL | Status: DC
  Administered 2018-12-12: 20 mg via ORAL
  Filled 2018-12-12: qty 2

## 2018-12-12 MED ORDER — ALBUTEROL SULFATE (2.5 MG/3ML) 0.083% IN NEBU: 2.5000 mg | INHALATION_SOLUTION | Freq: Four times a day (QID) | RESPIRATORY_TRACT | Status: DC | PRN

## 2018-12-12 MED ORDER — ACETAMINOPHEN 650 MG RE SUPP: 650.0000 mg | Freq: Four times a day (QID) | RECTAL | Status: DC | PRN

## 2018-12-12 MED ORDER — VITAMIN B-1 100 MG PO TABS
100.0000 mg | ORAL_TABLET | Freq: Every day | ORAL | Status: DC
  Administered 2018-12-12: 11:00:00 100 mg via ORAL
  Filled 2018-12-12: qty 1

## 2018-12-12 MED ORDER — ACETAMINOPHEN 325 MG PO TABS: 650.0000 mg | ORAL_TABLET | Freq: Four times a day (QID) | ORAL | Status: DC | PRN

## 2018-12-12 MED ORDER — ASPIRIN EC 81 MG PO TBEC
81.0000 mg | DELAYED_RELEASE_TABLET | Freq: Every day | ORAL | Status: DC
  Administered 2018-12-12: 81 mg via ORAL
  Filled 2018-12-12: qty 1

## 2018-12-12 MED ORDER — FINASTERIDE 5 MG PO TABS
5.0000 mg | ORAL_TABLET | Freq: Every day | ORAL | Status: DC
  Administered 2018-12-12: 5 mg via ORAL
  Filled 2018-12-12 (×2): qty 1

## 2018-12-12 MED ORDER — ZOLPIDEM TARTRATE 5 MG PO TABS: 5.0000 mg | ORAL_TABLET | Freq: Every evening | ORAL | Status: DC | PRN

## 2018-12-12 MED ORDER — TRAZODONE HCL 100 MG PO TABS: 200.0000 mg | ORAL_TABLET | Freq: Every day | ORAL | Status: DC

## 2018-12-12 NOTE — ED Provider Notes (Signed)
[facility] EMERGENCY DEPARTMENT Provider Note   CSN: [id number] Arrival date & time: 12/12/18  0009    History   Chief Complaint No chief complaint on file.   HPI Frank Meyer is a 78 y.o. male.     HPI  Patient is a 78 year old male with a past medical history of emphysema, dyslipidemia, hilar adenopathy, mild cognitive impairment, and TIA presenting for episode of altered mental status.  He reports that he does not remember the episode for which EMS was called tonight and he is not sure why he is here.  Patient reports that he was in his usual state of health yesterday prior to bed, and he feels back to baseline currently.  He specifically denies any word finding difficulty, difficulty speaking, weakness or numbness, confusion, chest pain, shortness of breath, fever or chills.   Collateral information obtained from patient's wife, Frank Meyer who states that as they were going to bed, she looked over noted that her husband was "thrashing".  She reports that he had his right arm up, and was sweating, and is not responding to her when she was asking him what was wrong.  She reports that this occurred for approximately 5 to 10 minutes.  She states that he stopped thrashing and just "laid there".  He appeared to be unresponsive, with his eyes half open.  She reports that he would not respond to her questions about answering who she was.  Once he finally came to, he was able to state their dog's name as well as his wife's name.  She reports that his TIA history.   Past Medical History:  Diagnosis Date  . Basal cell carcinoma   . Benign prostatic hypertrophy   . Dyslipidemia   . Gallbladder problem   . Gastroesophageal reflux disease   . Hiatal hernia   . Mild memory disturbance   . TIA (transient ischemic attack)     Patient Active Problem List   Diagnosis Date Noted  . Right upper lobe pulmonary nodule 03/25/2018  . Mediastinal adenopathy 03/25/2018  .  Hilar adenopathy 03/25/2018  . Dyspnea on exertion 03/18/2018  . Emphysema lung ([place]) 03/18/2018  . Dyslipidemia 03/18/2018  . History of smoking 03/18/2018  . 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 Memory loss 05/09/2013    Past Surgical History:  Procedure Laterality Date  . none          Home Medications    Prior to Admission medications   Medication Sig Start Date End Date Taking? Authorizing Provider  albuterol (PROVENTIL HFA;VENTOLIN HFA) 108 (90 Base) MCG/ACT inhaler Inhale 2 puffs into the lungs every 6 (six) hours as needed for wheezing or shortness of breath. 03/25/18   Garner Nash, DO  aspirin 81 MG tablet Take 81 mg by mouth daily.     [provider]  atorvastatin (LIPITOR) 20 MG tablet Take 20 mg by mouth daily.    [provider]  cholecalciferol (VITAMIN D) 1000 UNITS tablet Take 1,000 Units by mouth daily.    [provider]  donepezil (ARICEPT) 10 MG tablet Take 1 tablet (10 mg total) by mouth daily. 05/17/18   Ward Givens, NP  Fexofenadine-Pseudoephedrine (ALLEGRA-D 24 HOUR PO) Take 1 tablet by mouth daily.    [provider]  finasteride (PROSCAR) 5 MG tablet Take 5 mg by mouth daily.    [provider]  ipratropium (ATROVENT) 0.06 % nasal spray  02/09/13   [provider]  Multiple Vitamins-Minerals (MULTIVITAMIN  ADULTS 50+) TABS Take by mouth.    [provider]  omeprazole (PRILOSEC) 40 MG capsule  03/29/13   [provider]  traZODone (DESYREL) 100 MG tablet TAKE 2 TABLETS BY MOUTH AT  BEDTIME 09/05/18   Ward Givens, NP  zaleplon (SONATA) 10 MG capsule Take 1 capsule (10 mg total) by mouth at bedtime as needed for sleep. 05/17/18   Ward Givens, NP    Family History Family History  Problem Relation Age of Onset  . Obesity Mother   . Memory loss Mother     Social History Social History   Tobacco Use  . Smoking status: Former Smoker    Last attempt to quit: 05/04/1979    Years since  quitting: 39.6  . Smokeless tobacco: Never Used  Substance Use Topics  . Alcohol use: Yes    Alcohol/week: 15.0 - 20.0 standard drinks    Types: 15 - 20 Cans of beer per week  . Drug use: Yes     Allergies   Patient has no known allergies.   Review of Systems Review of Systems  Constitutional: Negative for chills and fever.  HENT: Negative for congestion and sore throat.   Eyes: Negative for visual disturbance.  Respiratory: Negative for cough, chest tightness and shortness of breath.   Cardiovascular: Negative for chest pain.  Gastrointestinal: Negative for abdominal pain, nausea and vomiting.  Genitourinary: Negative for dysuria and flank pain.  Musculoskeletal: Negative for back pain and myalgias.  Skin: Negative for rash.  Neurological: Negative for dizziness, speech difficulty, light-headedness and headaches.       +Loss of consciousness     Physical Exam Updated Vital Signs BP 137/81   Pulse (!) 46   Temp 97.6 F (36.4 C) (Oral)   Resp 14   SpO2 97%   Physical Exam Vitals signs and nursing note reviewed.  Constitutional:      General: He is not in acute distress.    Appearance: He is well-developed.     Comments: Fully alert, nontoxic-appearing, and participating in exam.  HENT:     Head: Normocephalic and atraumatic.  Eyes:     Conjunctiva/sclera: Conjunctivae normal.     Pupils: Pupils are equal, round, and reactive to light.  Neck:     Musculoskeletal: Normal range of motion and neck supple.  Cardiovascular:     Rate and Rhythm: Regular rhythm. Bradycardia present.     Heart sounds: S1 normal and S2 normal. No murmur.     Comments: No LE edema or calf tenderness. Pulmonary:     Effort: Pulmonary effort is normal.     Breath sounds: Rales present.     Comments: Coarse crackles in bilateral lung bases. Abdominal:     General: There is no distension.     Palpations: Abdomen is soft.     Tenderness: There is no abdominal tenderness. There is no  guarding.  Musculoskeletal: Normal range of motion.        General: No deformity.  Lymphadenopathy:     Cervical: No cervical adenopathy.  Skin:    General: Skin is warm and dry.     Findings: No erythema or rash.  Neurological:     Mental Status: He is alert.     Comments: Mental Status:  Alert, oriented, thought content appropriate, able to give a coherent history. Speech fluent without evidence of aphasia. Able to follow 2 step commands without difficulty.  Cranial Nerves:  II:  Peripheral visual fields grossly normal, pupils  equal, round, reactive to light III,IV, VI: ptosis not present, extra-ocular motions intact bilaterally  V,VII: smile symmetric, facial light touch sensation equal VIII: hearing grossly normal to voice  X: uvula elevates symmetrically  XI: bilateral shoulder shrug symmetric and strong XII: midline tongue extension without fassiculations Motor:  Normal tone. 5/5 in upper and lower extremities bilaterally including strong and equal grip strength and dorsiflexion/plantar flexion Sensory: Light touch normal in all extremities.  Cerebellar: normal finger-to-nose with bilateral upper extremities Gait: normal gait and balance Stance: No pronator drift and good coordination, strength, and position sense with tapping of bilateral arms (performed in sitting position). CV: distal pulses palpable throughout    Psychiatric:        Behavior: Behavior normal.        Thought Content: Thought content normal.        Judgment: Judgment normal.      ED Treatments / Results  Labs (all labs ordered are listed, but only abnormal results are displayed) Labs Reviewed  APTT - Abnormal; Notable for the following components:      Result Value   aPTT 42 (*)    All other components within normal limits  COMPREHENSIVE METABOLIC PANEL - Abnormal; Notable for the following components:   Glucose, Bld 103 (*)    Calcium 8.6 (*)    Total Protein 6.4 (*)    Albumin 3.1 (*)    All  other components within normal limits  CBC WITH DIFFERENTIAL/PLATELET - Abnormal; Notable for the following components:   Eosinophils Absolute 0.6 (*)    All other components within normal limits  CBG MONITORING, ED - Abnormal; Notable for the following components:   Glucose-Capillary 100 (*)    All other components within normal limits  PROTIME-INR  URINALYSIS, ROUTINE W REFLEX MICROSCOPIC  TROPONIN I  I-STAT CREATININE, ED    EKG EKG Interpretation  Date/Time:  Monday December 12 2018 02:06:53 EDT Ventricular Rate:  49 PR Interval:    QRS Duration: 83 QT Interval:  498 QTC Calculation: 450 R Axis:   -17 Text Interpretation:  Age not entered, assumed to be  78 years old for purpose of ECG interpretation Sinus bradycardia Borderline prolonged PR interval Borderline left axis deviation LAD new, new TWI in 2 compared to 1999 Confirmed by Merrily Pew [phone]) on 12/12/2018 2:10:21 AM   Radiology Ct Head Wo Contrast  Result Date: 12/12/2018 CLINICAL DATA:  78 year old male with altered mental status. EXAM: CT HEAD WITHOUT CONTRAST TECHNIQUE: Contiguous axial images were obtained from the base of the skull through the vertex without intravenous contrast. COMPARISON:  PET-CT 04/08/2018. FINDINGS: Brain: No midline shift, mass effect, or evidence of intracranial mass lesion. No ventriculomegaly. No acute intracranial hemorrhage identified. Normal for age gray-white matter differentiation. No cortical encephalomalacia identified. No cortically based acute infarct identified. Vascular: Mild Calcified atherosclerosis at the skull base. The right vertebral artery appears dominant. 6 No suspicious intracranial vascular hyperdensity. Skull: Negative. Sinuses/Orbits: Visualized paranasal sinuses and mastoids are stable and well pneumatized. Other: Negative orbits and scalp. IMPRESSION: Negative for age noncontrast CT appearance of the brain. Electronically Signed   By: [name]   On: 12/12/2018 01:39    Dg Chest Portable 1 View  Result Date: 12/12/2018 CLINICAL DATA:  4-YEAR-OLD MALE with altered mental status. EXAM: PORTABLE CHEST 1 VIEW COMPARISON:  Chest CT 06/21/2018.  PET-CT 04/08/2018. FINDINGS: Portable AP view at 0051 hours. Continued right apically opacification with evidence of the bronchiectasis demonstrated by CT. Superimposed bilateral  upper lung architectural distortion with paraseptal emphysema which was more apparent by CT. Stable cardiac size and mediastinal contours. Visualized tracheal air column is within normal limits. Left mid and bilateral lower lung increased interstitial markings are probably stable. No pleural effusion or definite acute pulmonary opacity. Stable surgical clips at the GE junction. No acute osseous abnormality identified. IMPRESSION: Chronic lung disease appears stable from the CT appearance in November. No superimposed acute findings identified. Electronically Signed   By: [name]   On: 12/12/2018 01:36    Procedures Procedures (including critical care time)  Medications Ordered in ED Medications - No data to display   Initial Impression / Assessment and Plan / ED Course  I have reviewed the triage vital signs and the nursing notes.  Pertinent labs & imaging results that were available during my care of the patient were reviewed by me and considered in my medical decision making (see chart for details).  Clinical Course as of Dec 11 229  Mon Dec 12, 2018  0214 Persistently bradycardic. Not on beta blockers. Pt does report that his heart rate is often in the 50s on his Fitbit.   [AM]    Clinical Course User Index [AM] Albesa Seen, PA-C       DDx includes: Orthostatic hypotension Stroke Vertebral artery dissection/stenosis Dysrhythmia PE Vasovagal/neurocardiogenic syncope Aortic stenosis Valvular disorder/Cardiomyopathy Anemia  Patient is nontoxic-appearing neurologically intact here in the emergency department.  He  remains bradycardic, and is not on beta-blockers.  Patient did have a hypoxic reading of 89 to 91% on arrival, however he has extensive lung disease as evidenced by CT in 2019, and he is followed by pulmonology for his emphysema, bronchiectasis, and possible pulmonary hypertension.  Based on history, greatest concern for cardiogenic syncope or sick sinus rhythm.  Low suspicion for TIA given lack of focal neurologic deficit per discussion with patient's wife.  Seizure is also possible, however patient's wife's descriptions of movements do not sound seizure-like in nature.  He does have EKG changes today including new T wave inversions in anterior leads as well as left axis deviation.  He is also first-degree AV block with sinus bradycardia.  Remainder of work-up demonstrates unremarkable CBC, normal electrolytes.  Troponin is negative.  CT head showing age-appropriate volume loss, but no significant abnormalities.  Chest x-ray demonstrating emphysematous findings consistent with his prior CT as well as bronchiectasis.  Given concern for cardiogenic syncope, will admit to hospitalist.  Family updated.  This is a shared visit with Dr. Merrily Pew. Patient was independently evaluated by this attending physician. Attending physician consulted in evaluation and management.  Final Clinical Impressions(s) / ED Diagnoses   Final diagnoses:  Loss of consciousness [facility])  Bradycardia    ED Discharge Orders    None       Tamala Julian 12/12/18 0349    Merrily Pew, MD 12/12/18 [phone]

## 2018-12-12 NOTE — ED Notes (Signed)
Taken to MRI at this time

## 2018-12-12 NOTE — Discharge Summary (Signed)
Physician Discharge Summary  Frank Meyer DDU:[id number] DOB: 11-Jan-1941 DOA: 12/12/2018  PCP: Burnard Bunting, MD  Admit date: 12/12/2018 Discharge date: 12/12/2018  Admitted From: home Discharge disposition: home   Recommendations for Outpatient Follow-Up:   1. Neurology follow up 2. ?OSA exam   Discharge Diagnosis:   Principal Problem:   Syncope Active Problems:   Memory loss   Emphysema lung ([place])   Dyslipidemia    Discharge Condition: Improved.  Diet recommendation: Low sodium, heart healthy  Wound care: None.  Code status: Full.   History of Present Illness:   Frank Meyer is a 78 y.o. male with history of TIA, memory disturbance, BPH was brought to the ER after patient had an unresponsive episode witnessed by patient's wife.  Patient symptoms happened last night while in bed.  Patient's wife noticed that patient was having suddenly and unresponsive episode lasting from 5 to 10 minutes with patient's whole body having a shaking spell mostly in the right upper extremity predominantly as per the patient wife were discussed with the ER physician.  After 10 minutes patient regained consciousness but does not recall the incident.  Did not have any incontinence of urine or tongue bite did not have any headache chest pain palpitations.  Does not have any new medications recently added.  EMS was called patient blood sugar at the time was 116.  Patient was found to be bradycardic.   Hospital Course by Problem:   Unclear what happened - TIA/seizure/medication side effect -- only new medications is 10mg  of melatonin nightly -EEG normal -echo: Left Ventricle: The left ventricle has normal systolic function, with an ejection fraction of 55-60%. The cavity size was normal. There is no increase in left ventricular wall thickness. Left ventricular diastolic Doppler parameters are consistent with  impaired relaxation. No evidence of left ventricular regional  wall motion abnormalities.. -tele with sinus brady (per patient's fit bit) this is his baseline (around 50) and this goes up to 107 when he walks his 3 miles/day. -MRI negative for new CVA -patient says he had just taken his sleep medications and all his symptoms were due to him sleeping.   Medical Consultants:      Discharge Exam:   Vitals:   12/12/18 1140 12/12/18 1704  BP: 123/67 133/71  Pulse: (!) 54 (!) 58  Resp: 17 17  Temp: 97.9 F (36.6 C) 98.3 F (36.8 C)  SpO2: 93% 93%   Vitals:   12/12/18 0437 12/12/18 0518 12/12/18 1140 12/12/18 1704  BP: 129/71 129/71 123/67 133/71  Pulse: (!) 52 (!) 52 (!) 54 (!) 58  Resp: 18 18 17 17   Temp: 97.6 F (36.4 C) 97.6 F (36.4 C) 97.9 F (36.6 C) 98.3 F (36.8 C)  TempSrc: Oral Oral Oral Oral  SpO2: 94%  93% 93%  Weight:  76 kg    Height:  5\' 7"  (1.702 m)      General exam: Appears calm and comfortable. Anxious to go home    The results of significant diagnostics from this hospitalization (including imaging, microbiology, ancillary and laboratory) are listed below for reference.     Procedures and Diagnostic Studies:   Ct Head Wo Contrast  Result Date: 12/12/2018 CLINICAL DATA:  78 year old male with altered mental status. EXAM: CT HEAD WITHOUT CONTRAST TECHNIQUE: Contiguous axial images were obtained from the base of the skull through the vertex without intravenous contrast. COMPARISON:  PET-CT 04/08/2018. FINDINGS: Brain: No midline shift, mass effect, or evidence  of intracranial mass lesion. No ventriculomegaly. No acute intracranial hemorrhage identified. Normal for age gray-white matter differentiation. No cortical encephalomalacia identified. No cortically based acute infarct identified. Vascular: Mild Calcified atherosclerosis at the skull base. The right vertebral artery appears dominant. 6 No suspicious intracranial vascular hyperdensity. Skull: Negative. Sinuses/Orbits: Visualized paranasal sinuses and mastoids are  stable and well pneumatized. Other: Negative orbits and scalp. IMPRESSION: Negative for age noncontrast CT appearance of the brain. Electronically Signed   By: Frank Ann M.D.   On: 12/12/2018 01:39   Mr Brain Wo Contrast  Result Date: 12/12/2018 CLINICAL DATA:  Altered level of consciousness. Episode of unresponsiveness and staring blank Lee lasting approximately 5-10 minutes. EXAM: MRI HEAD WITHOUT CONTRAST TECHNIQUE: Multiplanar, multiecho pulse sequences of the brain and surrounding structures were obtained without intravenous contrast. COMPARISON:  CT head without contrast 12/12/2018 FINDINGS: Brain: No acute infarct, hemorrhage, or mass lesion is present. Mild atrophy and white matter changes are normal for age. 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 A remote lacunar infarct is also present in the left thalamus. No other focal infarcts are present. The ventricles are of proportionate to the degree of atrophy. The internal auditory canals are within normal limits. The brainstem and cerebellum are otherwise within normal limits. Vascular: Flow is present in the major intracranial arteries. Skull and upper cervical spine: The craniocervical junction is normal. Upper cervical spine is within normal limits. Marrow signal is unremarkable. Sinuses/Orbits: The paranasal sinuses and mastoid air cells are clear. The globes and orbits are within normal limits. IMPRESSION: 1. No acute or focal intracranial abnormality to explain the patient's episode of unresponsiveness and staring. 2. 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 3. MRI appearance of the brain is otherwise normal for age. Electronically Signed   By: Frank Morelle M.D.   On: 12/12/2018 04:51   Dg Chest Portable 1 View  Result Date: 12/12/2018 CLINICAL DATA:  76-YEAR-OLD MALE with altered mental status. EXAM: PORTABLE CHEST 1 VIEW COMPARISON:  Chest CT 06/21/2018.  PET-CT 04/08/2018. FINDINGS:  Portable AP view at 0051 hours. Continued right apically opacification with evidence of the bronchiectasis demonstrated by CT. Superimposed bilateral upper lung architectural distortion with paraseptal emphysema which was more apparent by CT. Stable cardiac size and mediastinal contours. Visualized tracheal air column is within normal limits. Left mid and bilateral lower lung increased interstitial markings are probably stable. No pleural effusion or definite acute pulmonary opacity. Stable surgical clips at the GE junction. No acute osseous abnormality identified. IMPRESSION: Chronic lung disease appears stable from the CT appearance in November. No superimposed acute findings identified. Electronically Signed   By: Frank Ann M.D.   On: 12/12/2018 01:36     Labs:   Basic Metabolic Panel: Recent Labs  Lab 12/12/18 0054 12/12/18 0107 12/12/18 0314  NA 137  --  138  K 3.9  --  3.9  CL 102  --  103  CO2 24  --  25  GLUCOSE 103*  --  98  BUN 17  --  16  CREATININE 1.11 1.00 1.10  CALCIUM 8.6*  --  8.5*  MG  --   --  2.3   GFR Estimated Creatinine Clearance: 52.6 mL/min (by C-G formula based on SCr of 1.1 mg/dL). Liver Function Tests: Recent Labs  Lab 12/12/18 0054  AST 32  ALT 30  ALKPHOS 54  BILITOT 0.6  PROT 6.4*  ALBUMIN 3.1*   No results for input(s): LIPASE,  AMYLASE in the last 168 hours. No results for input(s): AMMONIA in the last 168 hours. Coagulation profile Recent Labs  Lab 12/12/18 0054  INR 1.0    CBC: Recent Labs  Lab 12/12/18 0054 12/12/18 0314  WBC 7.2 7.4  NEUTROABS 3.8  --   HGB 13.6 13.7  HCT 42.5 42.0  MCV 89.1 89.2  PLT 209 202   Cardiac Enzymes: Recent Labs  Lab 12/12/18 0054 12/12/18 0314 12/12/18 1056 12/12/18 1510  TROPONINI <0.03 <0.03 <0.03 <0.03   BNP: Invalid input(s): POCBNP CBG: Recent Labs  Lab 12/12/18 0030  GLUCAP 100*   D-Dimer No results for input(s): DDIMER in the last 72 hours. Hgb A1c No results for input(s):  HGBA1C in the last 72 hours. Lipid Profile No results for input(s): CHOL, HDL, LDLCALC, TRIG, CHOLHDL, LDLDIRECT in the last 72 hours. Thyroid function studies Recent Labs    12/12/18 0314  TSH 1.890   Anemia work up No results for input(s): VITAMINB12, FOLATE, FERRITIN, TIBC, IRON, RETICCTPCT in the last 72 hours. Microbiology No results found for this or any previous visit (from the past 240 hour(s)).   Discharge Instructions:   Discharge Instructions    Diet - low sodium heart healthy   Complete by:  As directed    Increase activity slowly   Complete by:  As directed      Allergies as of 12/12/2018   No Known Allergies     Medication List    STOP taking these medications   zaleplon 10 MG capsule Commonly known as:  SONATA     TAKE these medications   albuterol 108 (90 Base) MCG/ACT inhaler Commonly known as:  VENTOLIN HFA Inhale 2 puffs into the lungs every 6 (six) hours as needed for wheezing or shortness of breath.   ALLEGRA-D 24 HOUR PO Take 1 tablet by mouth daily.   aspirin 81 MG tablet Take 81 mg by mouth daily.   atorvastatin 20 MG tablet Commonly known as:  LIPITOR Take 20 mg by mouth daily.   cholecalciferol 1000 units tablet Commonly known as:  VITAMIN D Take 1,000 Units by mouth daily.   donepezil 10 MG tablet Commonly known as:  ARICEPT Take 1 tablet (10 mg total) by mouth daily.   finasteride 5 MG tablet Commonly known as:  PROSCAR Take 5 mg by mouth daily.   ipratropium 0.06 % nasal spray Commonly known as:  ATROVENT Place 2 sprays into both nostrils daily as needed for rhinitis.   Melatonin 10 MG Tabs Take 10 mg by mouth at bedtime.   Multivitamin Adults 50+ Tabs Take 1 tablet by mouth daily.   omeprazole 40 MG capsule Commonly known as:  PRILOSEC Take 40 mg by mouth daily.   thiamine 100 MG tablet Take 1 tablet (100 mg total) by mouth daily. Start taking on:  December 13, 2018   traZODone 100 MG tablet Commonly known as:   DESYREL TAKE 2 TABLETS BY MOUTH AT  BEDTIME      Follow-up Information    Burnard Bunting, MD Follow up in 1 week(s).   Specialty:  Internal Medicine Contact information: [place] [address] [phone]        Park Liter, MD .   Specialty:  Cardiology Contact information: [place] [address] [phone]        Kathrynn Ducking, MD Follow up in 3 week(s).   Specialty:  Neurology Contact information: [place]  [place] [address] [phone]            Time coordinating discharge: 25 min  Signed:  Geradine Girt DO  [facility] 12/12/2018, 5:45 PM

## 2018-12-12 NOTE — Progress Notes (Signed)
  Echocardiogram 2D Echocardiogram has been performed.  Frank Meyer 12/12/2018, 9:30 AM

## 2018-12-12 NOTE — Progress Notes (Signed)
Discharge and medication education given to patient with teach back. Peripheral iv removed clean dry and intact, pressure and dressing applied. Patients questions and concerns were answered. Patient's belongings with patient: clothes,shoes, cell phone, wearing his watch. Patient's wife was called and will be here in 15 minutes. Patient denies any further questions at this time. Pt is currently sitting in the wheelchair waiting for wife to arrive in [place] entrance to be taken by nurse tech.

## 2018-12-12 NOTE — Care Management Obs Status (Signed)
[place] NOTIFICATION   Patient Details  Name: HAMP MORELAND MRN: [id number] Date of Birth: 01-31-1941   Medicare Observation Status Notification Given:  Yes(telephone consent, letter explained in detail, all questions answered)    Royston Bake, RN 12/12/2018, 1:46 PM

## 2018-12-12 NOTE — ED Notes (Addendum)
Patient transported to MRI. To got to 3 [place] after exam

## 2018-12-12 NOTE — ED Triage Notes (Signed)
Pt BIB EMS from home after having episode of altered mental status at 10:55 pm. Per EMS wife states pt. Not showing any emotion. Husband not acting himself. Husband did not recognize wife .pt. not verbal only moaning. Hx of TIA.   At triage pt. A&O x4 and no neuro deficit.   EMS VS BP 151/82 HR 63 RR 20 CBG 115 SpO2 93% RA

## 2018-12-12 NOTE — ED Notes (Signed)
Pt. De-sat to 88% on room air. Pt. Placed on 1 L nasal cannula SpO2 at 97%. PA aware, at bedside.

## 2018-12-12 NOTE — Procedures (Signed)
  Frank A. Merlene Laughter, MD     [email or web address]           HISTORY: The patient is a 78 year old man who presents with episode of syncope and convulsions.  The spell is semiology noted to have right-sided clonic activity.  MEDICATIONS:  Current Facility-Administered Medications:  .  acetaminophen (TYLENOL) tablet 650 mg, 650 mg, Oral, Q6H PRN **OR** acetaminophen (TYLENOL) suppository 650 mg, 650 mg, Rectal, Q6H PRN, Rise Patience, MD .  albuterol (PROVENTIL) (2.5 MG/3ML) 0.083% nebulizer solution 2.5 mg, 2.5 mg, Inhalation, Q6H PRN, Rise Patience, MD .  aspirin EC tablet 81 mg, 81 mg, Oral, Daily, Rise Patience, MD, 81 mg at 12/12/18 0935 .  atorvastatin (LIPITOR) tablet 20 mg, 20 mg, Oral, Daily, Rise Patience, MD, 20 mg at 12/12/18 0935 .  enoxaparin (LOVENOX) injection 40 mg, 40 mg, Subcutaneous, Daily, Rise Patience, MD .  finasteride (PROSCAR) tablet 5 mg, 5 mg, Oral, Daily, Rise Patience, MD, 5 mg at 12/12/18 0936 .  thiamine (VITAMIN B-1) tablet 100 mg, 100 mg, Oral, Daily, Vann, Jessica U, DO, 100 mg at 12/12/18 1036 .  traZODone (DESYREL) tablet 200 mg, 200 mg, Oral, QHS, Rise Patience, MD     ANALYSIS: A 16 channel recording using standard 10 20 measurements is conducted for 27 minutes.  There is a well-formed posterior dominant rhythm of 11.5 Hz which attenuates with eye opening.  There is beta activity observed in the frontal areas.  Awake and drowsy activities are observed.  Rudimentary K complexes are seen but does not transition to typical well-formed K complexes or spindles.  Photic stimulation is carried out without abnormal changes in the background activity.  There is no focal or lateral slowing.  There is no epileptiform activity is observed.   IMPRESSION: This is a normal recording of awake and drowsy states.      Frank Meyer A. Merlene Meyer, M.D.  Diplomate, Tax adviser of Psychiatry and  Neurology ( Neurology).

## 2018-12-12 NOTE — ED Notes (Signed)
ED TO INPATIENT HANDOFF REPORT  ED Nurse Name and Phone #:  Dellie Catholic" [phone]  S Name/Age/Gender Frank Meyer 78 y.o. male Room/Bed: 030C/030C  Code Status   Code Status: Full Code  Home/SNF/Other Home Patient oriented to: self, place, time and situation Is this baseline? Yes   Triage Complete: Triage complete  Chief Complaint t.i.a  Triage Note Pt BIB EMS from home after having episode of altered mental status at 10:55 pm. Per EMS wife states pt. Not showing any emotion. Husband not acting himself. Husband did not recognize wife .pt. not verbal only moaning. Hx of TIA.   At triage pt. A&O x4 and no neuro deficit.   EMS VS BP 151/82 HR 63 RR 20 CBG 115 SpO2 93% RA   Allergies No Known Allergies  Level of Care/Admitting Diagnosis ED Disposition    ED Disposition Condition Comment   Admit  Hospital Area: [facility] [100100]  Level of Care: Telemetry Medical [104]  I expect the patient will be discharged within 24 hours: No (not a candidate for 5C-Observation unit)  Covid Evaluation: N/A  Diagnosis: Syncope [601093]  Admitting Physician: Rise Patience [phone]  Attending Physician: Rise Patience [email or web address]  PT Class (Do Not Modify): Observation [104]  PT Acc Code (Do Not Modify): Observation [10022]       B Medical/Surgery History Past Medical History:  Diagnosis Date  . Basal cell carcinoma   . Benign prostatic hypertrophy   . Dyslipidemia   . Gallbladder problem   . Gastroesophageal reflux disease   . Hiatal hernia   . Mild memory disturbance   . TIA (transient ischemic attack)    Past Surgical History:  Procedure Laterality Date  . none       A IV Location/Drains/Wounds Patient Lines/Drains/Airways Status   Active Line/Drains/Airways    None          Intake/Output Last 24 hours No intake or output data in the 24 hours ending 12/12/18 0307  Labs/Imaging Results for orders placed or performed  during the hospital encounter of 12/12/18 (from the past 48 hour(s))  CBG monitoring, ED     Status: Abnormal   Collection Time: 12/12/18 12:30 AM  Result Value Ref Range   Glucose-Capillary 100 (H) 70 - 99 mg/dL  Protime-INR     Status: None   Collection Time: 12/12/18 12:54 AM  Result Value Ref Range   Prothrombin Time 12.7 11.4 - 15.2 seconds   INR 1.0 0.8 - 1.2    Comment: (NOTE) INR goal varies based on device and disease states. Performed at [facility], [place] [address], [place], [place] [address]   APTT     Status: Abnormal   Collection Time: 12/12/18 12:54 AM  Result Value Ref Range   aPTT 42 (H) 24 - 36 seconds    Comment:        IF BASELINE aPTT IS ELEVATED, SUGGEST PATIENT RISK ASSESSMENT BE USED TO DETERMINE APPROPRIATE ANTICOAGULANT THERAPY. Performed at [facility], [place] [address], [place], [place] [address]   Comprehensive metabolic panel     Status: Abnormal   Collection Time: 12/12/18 12:54 AM  Result Value Ref Range   Sodium 137 135 - 145 mmol/L   Potassium 3.9 3.5 - 5.1 mmol/L   Chloride 102 98 - 111 mmol/L   CO2 24 22 - 32 mmol/L   Glucose, Bld 103 (H) 70 - 99 mg/dL   BUN 17 8 - 23 mg/dL  Creatinine, Ser 1.11 0.61 - 1.24 mg/dL   Calcium 8.6 (L) 8.9 - 10.3 mg/dL   Total Protein 6.4 (L) 6.5 - 8.1 g/dL   Albumin 3.1 (L) 3.5 - 5.0 g/dL   AST 32 15 - 41 U/L   ALT 30 0 - 44 U/L   Alkaline Phosphatase 54 38 - 126 U/L   Total Bilirubin 0.6 0.3 - 1.2 mg/dL   GFR calc non Af Amer >60 >60 mL/min   GFR calc Af Amer >60 >60 mL/min   Anion gap 11 5 - 15    Comment: Performed at [facility], [place] [address], [place], [place] [address]  CBC WITH DIFFERENTIAL     Status: Abnormal   Collection Time: 12/12/18 12:54 AM  Result Value Ref Range   WBC 7.2 4.0 - 10.5 K/uL   RBC 4.77 4.22 - 5.81 MIL/uL   Hemoglobin 13.6 13.0 - 17.0 g/dL   HCT 42.5 39.0 - 52.0 %   MCV 89.1 80.0 - 100.0 fL   MCH 28.5 26.0 - 34.0 pg   MCHC 32.0 30.0 - 36.0 g/dL    RDW 13.1 11.5 - 15.5 %   Platelets 209 150 - 400 K/uL   nRBC 0.0 0.0 - 0.2 %   Neutrophils Relative % 53 %   Neutro Abs 3.8 1.7 - 7.7 K/uL   Lymphocytes Relative 28 %   Lymphs Abs 2.0 0.7 - 4.0 K/uL   Monocytes Relative 10 %   Monocytes Absolute 0.7 0.1 - 1.0 K/uL   Eosinophils Relative 8 %   Eosinophils Absolute 0.6 (H) 0.0 - 0.5 K/uL   Basophils Relative 1 %   Basophils Absolute 0.1 0.0 - 0.1 K/uL   Immature Granulocytes 0 %   Abs Immature Granulocytes 0.03 0.00 - 0.07 K/uL    Comment: Performed at [facility] [address], [place], [place] [address]  Troponin I - ONCE - STAT     Status: None   Collection Time: 12/12/18 12:54 AM  Result Value Ref Range   Troponin I <0.03 <0.03 ng/mL    Comment: Performed at [facility] [address], [place], [place] [address]  I-stat Creatinine, ED     Status: None   Collection Time: 12/12/18  1:07 AM  Result Value Ref Range   Creatinine, Ser 1.00 0.61 - 1.24 mg/dL  Urinalysis, Routine w reflex microscopic     Status: None   Collection Time: 12/12/18  1:27 AM  Result Value Ref Range   Color, Urine YELLOW YELLOW   APPearance CLEAR CLEAR   Specific Gravity, Urine 1.012 1.005 - 1.030   pH 6.0 5.0 - 8.0   Glucose, UA NEGATIVE NEGATIVE mg/dL   Hgb urine dipstick NEGATIVE NEGATIVE   Bilirubin Urine NEGATIVE NEGATIVE   Ketones, ur NEGATIVE NEGATIVE mg/dL   Protein, ur NEGATIVE NEGATIVE mg/dL   Nitrite NEGATIVE NEGATIVE   Leukocytes,Ua NEGATIVE NEGATIVE    Comment: Performed at [facility], [place] [address] [place], [place] [address]   Ct Head Wo Contrast  Result Date: 12/12/2018 CLINICAL DATA:  78 year old male with altered mental status. EXAM: CT HEAD WITHOUT CONTRAST TECHNIQUE: Contiguous axial images were obtained from the base of the skull through the vertex without intravenous contrast. COMPARISON:  PET-CT 04/08/2018. FINDINGS: Brain: No midline shift, mass effect, or evidence of intracranial mass lesion. No  ventriculomegaly. No acute intracranial hemorrhage identified. Normal for age gray-white matter differentiation. No cortical encephalomalacia identified. No cortically based  acute infarct identified. Vascular: Mild Calcified atherosclerosis at the skull base. The right vertebral artery appears dominant. 6 No suspicious intracranial vascular hyperdensity. Skull: Negative. Sinuses/Orbits: Visualized paranasal sinuses and mastoids are stable and well pneumatized. Other: Negative orbits and scalp. IMPRESSION: Negative for age noncontrast CT appearance of the brain. Electronically Signed   By: [name]   On: 12/12/2018 01:39   Dg Chest Portable 1 View  Result Date: 12/12/2018 CLINICAL DATA:  47-YEAR-OLD MALE with altered mental status. EXAM: PORTABLE CHEST 1 VIEW COMPARISON:  Chest CT 06/21/2018.  PET-CT 04/08/2018. FINDINGS: Portable AP view at 0051 hours. Continued right apically opacification with evidence of the bronchiectasis demonstrated by CT. Superimposed bilateral upper lung architectural distortion with paraseptal emphysema which was more apparent by CT. Stable cardiac size and mediastinal contours. Visualized tracheal air column is within normal limits. Left mid and bilateral lower lung increased interstitial markings are probably stable. No pleural effusion or definite acute pulmonary opacity. Stable surgical clips at the GE junction. No acute osseous abnormality identified. IMPRESSION: Chronic lung disease appears stable from the CT appearance in November. No superimposed acute findings identified. Electronically Signed   By: [name]   On: 12/12/2018 01:36    Pending Labs Unresulted Labs (From admission, onward)    Start     Ordered   12/19/18 0500  Creatinine, serum  (enoxaparin (LOVENOX)    CrCl >/= 30 ml/min)  Weekly,   R    Comments:  while on enoxaparin therapy    12/12/18 0305   12/12/18 6606  Basic metabolic panel  Tomorrow morning,   R     12/12/18 0305   12/12/18 0500  CBC   Tomorrow morning,   R     12/12/18 0305   12/12/18 0304  Magnesium  Once,   R     12/12/18 0305   12/12/18 0304  TSH  Once,   R     12/12/18 0305          Vitals/Pain Today's Vitals   12/12/18 0143 12/12/18 0200 12/12/18 0230 12/12/18 0300  BP:  132/74 135/75 (!) 129/93  Pulse:  (!) 44 (!) 45 (!) 43  Resp:  15 11 15   Temp:      TempSrc:      SpO2:  98% 91% 94%  PainSc: 0-No pain       Isolation Precautions No active isolations  Medications Medications  aspirin tablet 81 mg (has no administration in time range)  atorvastatin (LIPITOR) tablet 20 mg (has no administration in time range)  traZODone (DESYREL) tablet 200 mg (has no administration in time range)  zolpidem (AMBIEN) tablet 5 mg (has no administration in time range)  finasteride (PROSCAR) tablet 5 mg (has no administration in time range)  albuterol (VENTOLIN HFA) 108 (90 Base) MCG/ACT inhaler 2 puff (has no administration in time range)  acetaminophen (TYLENOL) tablet 650 mg (has no administration in time range)    Or  acetaminophen (TYLENOL) suppository 650 mg (has no administration in time range)  enoxaparin (LOVENOX) injection 40 mg (has no administration in time range)    Mobility walks Moderate fall risk   Focused Assessments Neuro Assessment Handoff:  Swallow screen pass? Yes  Cardiac Rhythm: Sinus bradycardia       Neuro Assessment: Within Defined Limits Neuro Checks:      Last Documented NIHSS Modified Score:   Has TPA been given? No If patient is a Neuro Trauma and patient is going to OR  before floor call report to [place] nurse: [phone] or [phone]     R Recommendations: See Admitting Provider Note  Report given to:   Additional Notes:

## 2018-12-12 NOTE — Progress Notes (Signed)
SATURATION QUALIFICATIONS: (This note is used to comply with regulatory documentation for home oxygen)  Patient Saturations on Room Air at Rest = 94%  Patient Saturations on Room Air while Ambulating = 91%   

## 2018-12-12 NOTE — H&P (Signed)
History and Physical    Frank Meyer DGU:[id number] DOB: [birth year] DOA: 12/12/2018  PCP: Burnard Bunting, MD  Patient coming from: Home.  Chief Complaint: Unresponsive episode.  HPI: Frank Meyer is a 78 y.o. male with history of TIA, memory disturbance, BPH was brought to the ER after patient had an unresponsive episode witnessed by patient's wife.  Patient symptoms happened last night while in bed.  Patient's wife noticed that patient was having suddenly and unresponsive episode lasting from 5 to 10 minutes with patient's whole body having a shaking spell mostly in the right upper extremity predominantly as per the patient wife were discussed with the ER physician.  After 10 minutes patient regained consciousness but does not recall the incident.  Did not have any incontinence of urine or tongue bite did not have any headache chest pain palpitations.  Does not have any new medications recently added.  EMS was called patient blood sugar at the time was 116.  Patient was found to be bradycardic.  ED Course: In the ER CT head was unremarkable EKG was showing sinus bradycardia with heart rate around 49 bpm.  Patient appeared nonfocal.  Patient admitted for syncope/unresponsive episode differentials include possible bradycardia versus seizure-like episode.  On my exam patient appears nonfocal is alert awake oriented to time place and person.  Review of Systems: As per HPI, rest all negative.   Past Medical History:  Diagnosis Date  . Basal cell carcinoma   . Benign prostatic hypertrophy   . Dyslipidemia   . Gallbladder problem   . Gastroesophageal reflux disease   . Hiatal hernia   . Mild memory disturbance   . TIA (transient ischemic attack)     Past Surgical History:  Procedure Laterality Date  . none       reports that he quit smoking about 39 years ago. He has never used smokeless tobacco. He reports current alcohol use of about 15.0 - 20.0 standard drinks of alcohol  per week. He reports current drug use.  No Known Allergies  Family History  Problem Relation Age of Onset  . Obesity Mother   . Memory loss Mother     Prior to Admission medications   Medication Sig Start Date End Date Taking? Authorizing Provider  albuterol (PROVENTIL HFA;VENTOLIN HFA) 108 (90 Base) MCG/ACT inhaler Inhale 2 puffs into the lungs every 6 (six) hours as needed for wheezing or shortness of breath. 03/25/18   Garner Nash, DO  aspirin 81 MG tablet Take 81 mg by mouth daily.     [provider]  atorvastatin (LIPITOR) 20 MG tablet Take 20 mg by mouth daily.    [provider]  cholecalciferol (VITAMIN D) 1000 UNITS tablet Take 1,000 Units by mouth daily.    [provider]  donepezil (ARICEPT) 10 MG tablet Take 1 tablet (10 mg total) by mouth daily. 05/17/18   Ward Givens, NP  Fexofenadine-Pseudoephedrine (ALLEGRA-D 24 HOUR PO) Take 1 tablet by mouth daily.    [provider]  finasteride (PROSCAR) 5 MG tablet Take 5 mg by mouth daily.    [provider]  ipratropium (ATROVENT) 0.06 % nasal spray  02/09/13   [provider]  Multiple Vitamins-Minerals (MULTIVITAMIN ADULTS 50+) TABS Take by mouth.    [provider]  omeprazole (PRILOSEC) 40 MG capsule  03/29/13   [provider]  traZODone (DESYREL) 100 MG tablet TAKE 2 TABLETS BY MOUTH AT  BEDTIME 09/05/18   Ward Givens, NP  zaleplon (SONATA) 10 MG capsule Take 1 capsule (10 mg total) by mouth at bedtime as needed for sleep. 05/17/18   Ward Givens, NP    Physical Exam: Vitals:   12/12/18 0116 12/12/18 0120 12/12/18 0130 12/12/18 0200  BP: 129/75  137/81 132/74  Pulse:  (!) 55 (!) 46 (!) 44  Resp:  16 14 15   Temp:      TempSrc:      SpO2:  94% 97% 98%      Constitutional: Moderately built and nourished. Vitals:   12/12/18 0116 12/12/18 0120 12/12/18 0130 12/12/18 0200  BP: 129/75  137/81 132/74  Pulse:  (!) 55 (!) 46 (!) 44  Resp:   16 14 15   Temp:      TempSrc:      SpO2:  94% 97% 98%   Eyes: Anicteric no pallor. ENMT: No discharge from the ears eyes nose and mouth. Neck: No mass felt.  No neck rigidity. Respiratory: No rhonchi or crepitations. Cardiovascular: S1-S2 heard. Abdomen: Soft nontender bowel sounds present. Musculoskeletal: No edema.  No joint effusion. Skin: No rash. Neurologic: Alert awake oriented to time place and person moves all extremities 5 x 5.  No facial asymmetry tongue is midline. Psychiatric: Appears normal.   Labs on Admission: I have personally reviewed following labs and imaging studies  CBC: Recent Labs  Lab 12/12/18 0054  WBC 7.2  NEUTROABS 3.8  HGB 13.6  HCT 42.5  MCV 89.1  PLT 373   Basic Metabolic Panel: Recent Labs  Lab 12/12/18 0054 12/12/18 0107  NA 137  --   K 3.9  --   CL 102  --   CO2 24  --   GLUCOSE 103*  --   BUN 17  --   CREATININE 1.11 1.00  CALCIUM 8.6*  --    GFR: CrCl cannot be calculated (Unknown ideal weight.). Liver Function Tests: Recent Labs  Lab 12/12/18 0054  AST 32  ALT 30  ALKPHOS 54  BILITOT 0.6  PROT 6.4*  ALBUMIN 3.1*   No results for input(s): LIPASE, AMYLASE in the last 168 hours. No results for input(s): AMMONIA in the last 168 hours. Coagulation Profile: Recent Labs  Lab 12/12/18 0054  INR 1.0   Cardiac Enzymes: Recent Labs  Lab 12/12/18 0054  TROPONINI <0.03   BNP (last 3 results) No results for input(s): PROBNP in the last 8760 hours. HbA1C: No results for input(s): HGBA1C in the last 72 hours. CBG: Recent Labs  Lab 12/12/18 0030  GLUCAP 100*   Lipid Profile: No results for input(s): CHOL, HDL, LDLCALC, TRIG, CHOLHDL, LDLDIRECT in the last 72 hours. Thyroid Function Tests: No results for input(s): TSH, T4TOTAL, FREET4, T3FREE, THYROIDAB in the last 72 hours. Anemia Panel: No results for input(s): VITAMINB12, FOLATE, FERRITIN, TIBC, IRON, RETICCTPCT in the last 72 hours. Urine analysis:     Component Value Date/Time   COLORURINE YELLOW 12/12/2018 0127   APPEARANCEUR CLEAR 12/12/2018 0127   LABSPEC 1.012 12/12/2018 0127   PHURINE 6.0 12/12/2018 0127   GLUCOSEU NEGATIVE 12/12/2018 0127   HGBUR NEGATIVE 12/12/2018 0127   BILIRUBINUR NEGATIVE 12/12/2018 0127   KETONESUR NEGATIVE 12/12/2018 0127   PROTEINUR NEGATIVE 12/12/2018 0127   NITRITE NEGATIVE 12/12/2018 0127   LEUKOCYTESUR NEGATIVE 12/12/2018 0127   Sepsis Labs: @LABRCNTIP (procalcitonin:4,lacticidven:4) )No results found for this or any previous visit (from the past 240 hour(s)).   Radiological Exams on Admission: Ct Head Wo Contrast  Result Date: 12/12/2018 CLINICAL DATA:  78 year old male  with altered mental status. EXAM: CT HEAD WITHOUT CONTRAST TECHNIQUE: Contiguous axial images were obtained from the base of the skull through the vertex without intravenous contrast. COMPARISON:  PET-CT 04/08/2018. FINDINGS: Brain: No midline shift, mass effect, or evidence of intracranial mass lesion. No ventriculomegaly. No acute intracranial hemorrhage identified. Normal for age gray-white matter differentiation. No cortical encephalomalacia identified. No cortically based acute infarct identified. Vascular: Mild Calcified atherosclerosis at the skull base. The right vertebral artery appears dominant. 6 No suspicious intracranial vascular hyperdensity. Skull: Negative. Sinuses/Orbits: Visualized paranasal sinuses and mastoids are stable and well pneumatized. Other: Negative orbits and scalp. IMPRESSION: Negative for age noncontrast CT appearance of the brain. Electronically Signed   By: [name]   On: 12/12/2018 01:39   Dg Chest Portable 1 View  Result Date: 12/12/2018 CLINICAL DATA:  45-YEAR-OLD MALE with altered mental status. EXAM: PORTABLE CHEST 1 VIEW COMPARISON:  Chest CT 06/21/2018.  PET-CT 04/08/2018. FINDINGS: Portable AP view at 0051 hours. Continued right apically opacification with evidence of the bronchiectasis  demonstrated by CT. Superimposed bilateral upper lung architectural distortion with paraseptal emphysema which was more apparent by CT. Stable cardiac size and mediastinal contours. Visualized tracheal air column is within normal limits. Left mid and bilateral lower lung increased interstitial markings are probably stable. No pleural effusion or definite acute pulmonary opacity. Stable surgical clips at the GE junction. No acute osseous abnormality identified. IMPRESSION: Chronic lung disease appears stable from the CT appearance in November. No superimposed acute findings identified. Electronically Signed   By: [name]   On: 12/12/2018 01:36    EKG: Independently reviewed.  Sinus bradycardia heart rate around 49 bpm.  Assessment/Plan Principal Problem:   Syncope Active Problems:   Memory loss   Emphysema lung (HCC)   Dyslipidemia    1. Syncope/unresponsive episode primary concerning for possible seizure versus bradycardia.  For now I am holding off patient's Aricept continue to monitor in telemetry check TSH cardiac markers 2D echo MRI brain EEG. 2. History of TIA on aspirin statins. 3. BPH on Proscar. 4. History of dementia on Aricept which is on hold due to bradycardia. 5. COPD not actively wheezing.  Note that patient chest x-ray showing chronic changes.  Which appears to be stable.   DVT prophylaxis: Lovenox. Code Status: Full code. Family Communication: No family at the bedside. Disposition Plan: Home. Consults called: None. Admission status: Observation.   Rise Patience MD [facility] Pager [phone].  If 7PM-7AM, please contact night-coverage [email or web address] Password [facility]  12/12/2018, 3:05 AM

## 2018-12-12 NOTE — Progress Notes (Signed)
   12/12/18 1005  Mobility  Activity Ambulated in hall;Dangled on edge of bed  Range of Motion Active;All extremities  Level of Assistance Independent  Assistive Device None  Minutes Stood 5 minutes  Minutes Ambulated 5 minutes  Distance Ambulated (ft) 400 ft  Mobility Response Tolerated well  Bed Position Semi-fowlers   SATURATION QUALIFICATIONS: (This note is used to comply with regulatory documentation for home oxygen)  Patient Saturations on Room Air at Rest = 93  Patient Saturations on Room Air while Ambulating = 91%  Patient Saturations on n/a Liters of oxygen while Ambulating = n/a%  Please briefly explain why patient needs home oxygen: Patient's oxygen initially dropped to 83 on room air while ambulating. With rest and pursed breathing throughout, patient maintained a saturation of 91-95% while ambulating.

## 2018-12-12 NOTE — TOC Initial Note (Signed)
Transition of Care [facility]) - Initial/Assessment Note    Patient Details  Name: Frank Meyer MRN: [id number] Date of Birth: 02/08/1941  [facility] CM/SW Contact:    Sherrilyn Rist Phone Number:  [phone] 12/12/2018, 1:48 PM  Clinical Narrative:                 Patient lives at home with spouse, independent of all of his ADL's; PCP is Dr Burnard Bunting; has private insurance with [facility] with prescription drug coverage; pharmacy of choice is [facility] and [facility] order; no DME, patient stated that he walks 6 miles everyday with his dog; no needs identified at this time.  Expected Discharge Plan: Home/Self Care Barriers to Discharge: No Barriers Identified   Patient Goals and CMS Choice Patient states their goals for this hospitalization and ongoing recovery are:: to go home CMS Medicare.gov Compare Post Acute Care list provided to:: Patient Choice offered to / list presented to : NA  Expected Discharge Plan and Services Expected Discharge Plan: Home/Self Care In-house Referral: NA Discharge Planning Services: NA Post Acute Care Choice: NA Living arrangements for the past 2 months: Single Family Home Expected Discharge Date: 12/14/18               DME Arranged: N/A DME Agency: NA       HH Arranged: NA HH Agency: NA        Prior Living Arrangements/Services Living arrangements for the past 2 months: Single Family Home Lives with:: Spouse Patient language and need for interpreter reviewed:: Yes Do you feel safe going back to the place where you live?: Yes      Need for Family Participation in Patient Care: No (Comment) Care giver support system in place?: Yes (comment)   Criminal Activity/Legal Involvement Pertinent to Current Situation/Hospitalization: No - Comment as needed  Activities of Daily Living Home Assistive Devices/Equipment: None ADL Screening (condition at time of admission) Patient's cognitive  ability adequate to safely complete daily activities?: Yes Is the patient deaf or have difficulty hearing?: No Does the patient have difficulty seeing, even when wearing glasses/contacts?: No Does the patient have difficulty concentrating, remembering, or making decisions?: Yes Patient able to express need for assistance with ADLs?: Yes Does the patient have difficulty dressing or bathing?: No Independently performs ADLs?: Yes (appropriate for developmental age) Does the patient have difficulty walking or climbing stairs?: No Weakness of Legs: None Weakness of Arms/Hands: None  Permission Sought/Granted Permission sought to share information with : Case Manager Permission granted to share information with : Yes, Verbal Permission Granted  Share Information with NAME: spouse           Emotional Assessment Appearance:: Developmentally appropriate Attitude/Demeanor/Rapport: Gracious, Engaged Affect (typically observed): Accepting Orientation: : Oriented to Self, Oriented to  Time, Oriented to Place, Oriented to Situation Alcohol / Substance Use: Not Applicable Psych Involvement: No (comment)  Admission diagnosis:  Bradycardia [R00.1] Loss of consciousness ([place]) [R40.20] Syncope [R55] Patient Active Problem List   Diagnosis Date Noted  . Syncope 12/12/2018  . Right upper lobe pulmonary nodule 03/25/2018  . Mediastinal adenopathy 03/25/2018  . Hilar adenopathy 03/25/2018  . Dyspnea on exertion 03/18/2018  . Emphysema lung ([place]) 03/18/2018  . Dyslipidemia 03/18/2018  . History of smoking 03/18/2018  . 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 Memory loss 05/09/2013   PCP:  Burnard Bunting, MD Pharmacy:   [id number] - [place], [place] - [address] [place] [address] Phone: [phone] Fax: [phone]  [place], [place] [facility] [address] [place] [address] [place] [address] Phone: [phone] Fax: [phone]     Social Determinants of Health (SDOH) Interventions    Readmission Risk Interventions No flowsheet data found.

## 2018-12-12 NOTE — ED Notes (Signed)
Pt. Arrive on room air. Pt. De-sat 88% placed on 1 L Nasal Cannula.   Weaned pt. Off Oxygen. Currently 95% on room air. O2 sat monitor at bedside. Will continue to monitor patient.

## 2018-12-12 NOTE — Progress Notes (Signed)
EEG Complete  Results Pending 

## 2018-12-12 NOTE — ED Notes (Signed)
Attempted report at this time. Will call back in 10 minutes

## 2018-12-12 NOTE — Progress Notes (Signed)
Patient admitted after midnight.  Unclear what happened-- TIA/seizure/medication side effect-- only new medications is 10mg  of melatonin nightly -EEG pending -echo read pending -ambulation pending -tele with sinus brady (per patient's fit bit) this is his baseline (around 50) and this goes up to 107 when he walks his 3 miles/day. -MRI negative for new CVA  [name] DO

## 2018-12-12 NOTE — ED Notes (Signed)
Patient transported to CT 

## 2018-12-16 ENCOUNTER — Other Ambulatory Visit: Payer: Self-pay | Admitting: Adult Health

## 2018-12-16 ENCOUNTER — Telehealth: Payer: Self-pay | Admitting: Cardiology

## 2018-12-16 NOTE — Telephone Encounter (Signed)
Virtual Visit Pre-Appointment Phone Call  "(Name), I am calling you today to discuss your upcoming appointment. We are currently trying to limit exposure to the virus that causes COVID-19 by seeing patients at home rather than in the office."  1. "What is the BEST phone number to call the day of the visit?" - include this in appointment notes  2. Do you have or have access to (through a family member/friend) a smartphone with video capability that we can use for your visit?" a. If yes - list this number in appt notes as cell (if different from BEST phone #) and list the appointment type as a VIDEO visit in appointment notes b. If no - list the appointment type as a PHONE visit in appointment notes  3. Confirm consent - "In the setting of the current Covid19 crisis, you are scheduled for a (phone or video) visit with your provider on (date) at (time).  Just as we do with many in-office visits, in order for you to participate in this visit, we must obtain consent.  If you'd like, I can send this to your mychart (if signed up) or email for you to review.  Otherwise, I can obtain your verbal consent now.  All virtual visits are billed to your insurance company just like a normal visit would be.  By agreeing to a virtual visit, we'd like you to understand that the technology does not allow for your provider to perform an examination, and thus may limit your provider's ability to fully assess your condition. If your provider identifies any concerns that need to be evaluated in person, we will make arrangements to do so.  Finally, though the technology is pretty good, we cannot assure that it will always work on either your or our end, and in the setting of a video visit, we may have to convert it to a phone-only visit.  In either situation, we cannot ensure that we have a secure connection.  Are you willing to proceed?" STAFF: Did the patient verbally acknowledge consent to telehealth visit? Document  YES/NO here: yes  4. Advise patient to be prepared - "Two hours prior to your appointment, go ahead and check your blood pressure, pulse, oxygen saturation, and your weight (if you have the equipment to check those) and write them all down. When your visit starts, your provider will ask you for this information. If you have an Apple Watch or Kardia device, please plan to have heart rate information ready on the day of your appointment. Please have a pen and paper handy nearby the day of the visit as well."  5. Give patient instructions for MyChart download to smartphone OR Doximity/Doxy.me as below if video visit (depending on what platform provider is using)  6. Inform patient they will receive a phone call 15 minutes prior to their appointment time (may be from unknown caller ID) so they should be prepared to answer    TELEPHONE CALL NOTE  Frank Meyer has been deemed a candidate for a follow-up tele-health visit to limit community exposure during the Covid-19 pandemic. I spoke with the patient via phone to ensure availability of phone/video source, confirm preferred email & phone number, and discuss instructions and expectations.  I reminded Frank Meyer to be prepared with any vital sign and/or heart rhythm information that could potentially be obtained via home monitoring, at the time of his visit. I reminded Frank Meyer to expect a phone call prior to  his visit.  [name] 12/16/2018 9:54 AM   INSTRUCTIONS FOR DOWNLOADING THE MYCHART APP TO SMARTPHONE  - The patient must first make sure to have activated MyChart and know their login information - If Apple, go to [employer] and type in MyChart in the search bar and download the app. If Android, ask patient to go to [employer] and type in [place] in the search bar and download the app. The app is free but as with any other app downloads, their phone may require them to verify saved payment information or  Apple/Android password.  - The patient will need to then log into the app with their MyChart username and password, and select [facility] as their healthcare provider to link the account. When it is time for your visit, go to the MyChart app, find appointments, and click Begin Video Visit. Be sure to Select Allow for your device to access the Microphone and Camera for your visit. You will then be connected, and your provider will be with you shortly.  **If they have any issues connecting, or need assistance please contact MyChart service desk [phone])**  **If using a computer, in order to ensure the best quality for their visit they will need to use either of the following Internet Browsers: [employer], or Google Chrome**  IF USING DOXIMITY or DOXY.ME - The patient will receive a link just prior to their visit by text.     FULL LENGTH CONSENT FOR TELE-HEALTH VISIT   I hereby voluntarily request, consent and authorize [facility] and its employed or contracted physicians, physician assistants, nurse practitioners or other licensed health care professionals (the Practitioner), to provide me with telemedicine health care services (the Services") as deemed necessary by the treating Practitioner. I acknowledge and consent to receive the Services by the Practitioner via telemedicine. I understand that the telemedicine visit will involve communicating with the Practitioner through live audiovisual communication technology and the disclosure of certain medical information by electronic transmission. I acknowledge that I have been given the opportunity to request an in-person assessment or other available alternative prior to the telemedicine visit and am voluntarily participating in the telemedicine visit.  I understand that I have the right to withhold or withdraw my consent to the use of telemedicine in the course of my care at any time, without affecting my right to future care  or treatment, and that the Practitioner or I may terminate the telemedicine visit at any time. I understand that I have the right to inspect all information obtained and/or recorded in the course of the telemedicine visit and may receive copies of available information for a reasonable fee.  I understand that some of the potential risks of receiving the Services via telemedicine include:   Delay or interruption in medical evaluation due to technological equipment failure or disruption;  Information transmitted may not be sufficient (e.g. poor resolution of images) to allow for appropriate medical decision making by the Practitioner; and/or   In rare instances, security protocols could fail, causing a breach of personal health information.  Furthermore, I acknowledge that it is my responsibility to provide information about my medical history, conditions and care that is complete and accurate to the best of my ability. I acknowledge that Practitioner's advice, recommendations, and/or decision may be based on factors not within their control, such as incomplete or inaccurate data provided by me or distortions of diagnostic images or specimens that may result from electronic transmissions. I  understand that the practice of medicine is not an exact science and that Practitioner makes no warranties or guarantees regarding treatment outcomes. I acknowledge that I will receive a copy of this consent concurrently upon execution via email to the email address I last provided but may also request a printed copy by calling the office of [facility].    I understand that my insurance will be billed for this visit.   I have read or had this consent read to me.  I understand the contents of this consent, which adequately explains the benefits and risks of the Services being provided via telemedicine.   I have been provided ample opportunity to ask questions regarding this consent and the Services and have had  my questions answered to my satisfaction.  I give my informed consent for the services to be provided through the use of telemedicine in my medical care  By participating in this telemedicine visit I agree to the above.

## 2018-12-19 DIAGNOSIS — I5032 Chronic diastolic (congestive) heart failure: Secondary | ICD-10-CM | POA: Insufficient documentation

## 2018-12-19 HISTORY — DX: Chronic diastolic (congestive) heart failure: I50.32

## 2018-12-20 ENCOUNTER — Telehealth: Payer: Self-pay | Admitting: Pulmonary Disease

## 2018-12-20 NOTE — Telephone Encounter (Signed)
Called the patient and he knows the chest CT was ordered and he believes it was to keep an eye on the lung nodule. However, he called because he was recently in the hospital and they ran multiple tests on him. He is wondering if the CT still needs to be done or not.  I told the patient I will forward his question to Dr. Valeta Harms and our office will call him with the response. Patient voiced understanding. Nothing further needed at this time.  Messge routed to Dr. Valeta Harms to advise.   Dr. Valeta Harms, based on his hospital visit on 12/12/18, do you still want him to proceed with the chest CT w/o contrast ordered 07/12/18 when COVID restrictions have been lifted?

## 2018-12-21 NOTE — Telephone Encounter (Signed)
Routing to [facility] so they are aware that it is okay to push pt's CT scan out until August prior to his OV with Dr. Valeta Harms.

## 2018-12-21 NOTE — Telephone Encounter (Signed)
PCCM:  Follow up imaging was ordered for mediastinal adenopathy. His recent hospitalization only including imaging of his brain.   Ok to push the images out to a 9 month follow up. Which would be in August d/t COVID restrictions on scheduling   Garner Nash, DO [facility] 12/21/2018 9:14 AM

## 2018-12-22 NOTE — Telephone Encounter (Signed)
LVM for the patient to advise of Dr. Juline Patch response regarding CT. Agreed it is ok to push to August.and that he will be contacted by the [facility] once it can be done.  Requested patient call back to confirm the message was received.

## 2018-12-30 ENCOUNTER — Encounter: Payer: Self-pay | Admitting: Cardiology

## 2018-12-30 ENCOUNTER — Other Ambulatory Visit: Payer: Self-pay

## 2018-12-30 ENCOUNTER — Telehealth (INDEPENDENT_AMBULATORY_CARE_PROVIDER_SITE_OTHER): Payer: Medicare Other | Admitting: Cardiology

## 2018-12-30 VITALS — Wt 160.0 lb

## 2018-12-30 DIAGNOSIS — E785 Hyperlipidemia, unspecified: Secondary | ICD-10-CM

## 2018-12-30 DIAGNOSIS — J439 Emphysema, unspecified: Secondary | ICD-10-CM

## 2018-12-30 DIAGNOSIS — R55 Syncope and collapse: Secondary | ICD-10-CM | POA: Diagnosis not present

## 2018-12-30 DIAGNOSIS — R0609 Other forms of dyspnea: Secondary | ICD-10-CM

## 2018-12-30 DIAGNOSIS — Z87891 Personal history of nicotine dependence: Secondary | ICD-10-CM

## 2018-12-30 NOTE — Addendum Note (Signed)
Addended by: Ashok Norris on: 12/30/2018 08:48 AM   Modules accepted: Orders

## 2018-12-30 NOTE — Patient Instructions (Signed)
Medication Instructions:  Your physician recommends that you continue on your current medications as directed. Please refer to the Current Medication list given to you today.  If you need a refill on your cardiac medications before your next appointment, please call your pharmacy.   Lab work: None.  If you have labs (blood work) drawn today and your tests are completely normal, you will receive your results only by: Marland Kitchen MyChart Message (if you have MyChart) OR . A paper copy in the mail If you have any lab test that is abnormal or we need to change your treatment, we will call you to review the results.  Testing/Procedures: Your physician has recommended that you wear a holter monitor. Holter monitors are medical devices that record the heart's electrical activity. Doctors most often use these monitors to diagnose arrhythmias. Arrhythmias are problems with the speed or rhythm of the heartbeat. The monitor is a small, portable device. You can wear one while you do your normal daily activities. This is usually used to diagnose what is causing palpitations/syncope (passing out). Wear for 14 days.     Follow-Up: At [facility], you and your health needs are our priority.  As part of our continuing mission to provide you with exceptional heart care, we have created designated Provider Care Teams.  These Care Teams include your primary Cardiologist (physician) and Advanced Practice Providers (APPs -  Physician Assistants and Nurse Practitioners) who all work together to provide you with the care you need, when you need it. You will need a follow up appointment in 6 weeks.  Please call our office 2 months in advance to schedule this appointment.  You may see Jenne Campus, MD or another member of our [place] Provider Team in [place]: Shirlee More, MD . Jyl Heinz, MD  Any Other Special Instructions Will Be Listed Below (If Applicable).

## 2018-12-30 NOTE — Progress Notes (Signed)
Virtual Visit via Video Note   This visit type was conducted due to national recommendations for restrictions regarding the COVID-19 Pandemic (e.g. social distancing) in an effort to limit this patient's exposure and mitigate transmission in our community.  Due to his co-morbid illnesses, this patient is at least at moderate risk for complications without adequate follow up.  This format is felt to be most appropriate for this patient at this time.  All issues noted in this document were discussed and addressed.  A limited physical exam was performed with this format.  Please refer to the patient's chart for his consent to telehealth for [facility].  Evaluation Performed:  Follow-up visit  This visit type was conducted due to national recommendations for restrictions regarding the COVID-19 Pandemic (e.g. social distancing).  This format is felt to be most appropriate for this patient at this time.  All issues noted in this document were discussed and addressed.  No physical exam was performed (except for noted visual exam findings with Video Visits).  Please refer to the patient's chart (MyChart message for video visits and phone note for telephone visits) for the patient's consent to telehealth for [facility].  Date:  12/30/2018  ID: Frank Meyer, DOB November 05, 1940, MRN [id number]   Patient Location: [place] [address]   Provider location:   [place] Office  PCP:  Burnard Bunting, MD  Cardiologist:  Jenne Campus, MD     Chief Complaint: I was in the hospital  History of Present Illness:    Frank Meyer is a 78 y.o. male  who presents via audio/video conferencing for a telehealth visit today.  He was originally sent to me because of dyspnea on exertion he did have an echocardiogram which was fine and then stress testing there is some mentioning about potentially enlargement of the right ventricle to stress test.  Echocardiogram  thereafter did not show any enlargement of the right ventricle.  Recently he ended up going to the hospital.  Reason for hospitalization was episode of confusion/syncope.  Situation is quite interesting apparently he took his medication that he always takes before going to bed which is trazodone as well as melatonin.  Then about half an hour later he tried to wake up he was confused she started shaking.  His wife describes quite precisely.  He started shaking try to pull sheet on him she went to his side of his bed try to wake him up she could not he was unresponsive in the sense he was not responding to her comments.  But she also was trying to asking who she was she was not able to answer this.  There was no passing out.  Anti-episodes lasted between 5 and 10 minutes eventually not going to the hospital.  Quite extensive evaluation has been done including repeated EKG as well as repeat an echocardiogram.  He did not show any clear-cut explanation for his symptoms.  He was sent home now he is doing very well he plays golf twice a week Monday through Thursday, he goes on a walk every single day he walks for about 3 miles every day.  Overall doing well   The patient does not have symptoms concerning for COVID-19 infection (fever, chills, cough, or new SHORTNESS OF BREATH).    Prior CV studies:   The following studies were reviewed today:  Echocardiogram done on December 12, 2018 showed: IMPRESSIONS    1. The left ventricle has  normal systolic function, with an ejection fraction of 55-60%. The cavity size was normal. Left ventricular diastolic Doppler parameters are consistent with impaired relaxation. No evidence of left ventricular regional wall  motion abnormalities.  2. The right ventricle has normal systolic function. The cavity was normal. There is no increase in right ventricular wall thickness.  3. Left atrial size was mildly dilated.  4. Right atrial size was mildly dilated.  5. No evidence  of mitral valve stenosis. Trivial mitral regurgitation.  6. The aortic valve is tricuspid. Mild calcification of the aortic valve. Aortic valve regurgitation is mild by color flow Doppler. No stenosis of the aortic valve.  7. The aortic root and ascending aorta are normal in size and structure.  8. Normal IVC size. No complete TR doppler jet so unable to estimate PA systolic pressure. Nor     Past Medical History:  Diagnosis Date  . Basal cell carcinoma   . Benign prostatic hypertrophy   . Dyslipidemia   . Gallbladder problem   . Gastroesophageal reflux disease   . Hiatal hernia   . Mild memory disturbance   . TIA (transient ischemic attack)     Past Surgical History:  Procedure Laterality Date  . none       Current Meds  Medication Sig  . albuterol (PROVENTIL HFA;VENTOLIN HFA) 108 (90 Base) MCG/ACT inhaler Inhale 2 puffs into the lungs every 6 (six) hours as needed for wheezing or shortness of breath.  Marland Kitchen aspirin 81 MG tablet Take 81 mg by mouth daily.   Marland Kitchen atorvastatin (LIPITOR) 20 MG tablet Take 20 mg by mouth daily.  . cholecalciferol (VITAMIN D) 1000 UNITS tablet Take 1,000 Units by mouth daily.  Marland Kitchen donepezil (ARICEPT) 10 MG tablet Take 1 tablet (10 mg total) by mouth daily.  Marland Kitchen Fexofenadine-Pseudoephedrine (ALLEGRA-D 24 HOUR PO) Take 1 tablet by mouth daily.  . finasteride (PROSCAR) 5 MG tablet Take 5 mg by mouth daily.  Marland Kitchen ipratropium (ATROVENT) 0.06 % nasal spray Place 2 sprays into both nostrils daily as needed for rhinitis.   . Melatonin 10 MG TABS Take 10 mg by mouth at bedtime.  . Multiple Vitamins-Minerals (MULTIVITAMIN ADULTS 50+) TABS Take 1 tablet by mouth daily.   Marland Kitchen omeprazole (PRILOSEC) 40 MG capsule Take 40 mg by mouth daily.   Marland Kitchen thiamine 100 MG tablet Take 1 tablet (100 mg total) by mouth daily.  . traZODone (DESYREL) 100 MG tablet TAKE 2 TABLETS BY MOUTH AT  BEDTIME      Family History: The patient's family history includes Memory loss in his mother;  Obesity in his mother.   ROS:   Please see the history of present illness.     All other systems reviewed and are negative.   Labs/Other Tests and Data Reviewed:     Recent Labs: 12/12/2018: ALT 30; BUN 16; Creatinine, Ser 1.10; Hemoglobin 13.7; Magnesium 2.3; Platelets 202; Potassium 3.9; Sodium 138; TSH 1.890  Recent Lipid Panel No results found for: CHOL, TRIG, HDL, CHOLHDL, VLDL, LDLCALC, LDLDIRECT    Exam:    Vital Signs:  Wt 160 lb (72.6 kg)   BMI 25.06 kg/m     Wt Readings from Last 3 Encounters:  12/30/18 160 lb (72.6 kg)  12/12/18 167 lb 8.8 oz (76 kg)  08/02/18 160 lb 1.9 oz (72.6 kg)     Well nourished, well developed in no acute distress. Alert awake oriented x3.  Happy to be able to talk to me over the video link  denies having any symptoms.  He is anxious to go for a walk today since [place] a [place]  Diagnosis for this visit:   1. Syncope and collapse   2. Pulmonary emphysema, unspecified emphysema type ([place])   3. Dyspnea on exertion   4. Dyslipidemia   5. History of smoking      ASSESSMENT & PLAN:    1.  Syncope and collapse.  I am not really sure if we dealing with syncope.  It looks more like altered mental status to me.  Still because of his good condition I think will be appropriate to make sure this is not related to arrhythmia, therefore asked him to wear a 14 days monitor.  I did review records from the hospital. 2.  Pulmonary emphysema followed by internal medicine team.  Long history of smoking but he does not smoke anymore. 3.  Dyspnea on exertion denies having any actually doing quite well. 4.  Dyslipidemia on atorvastatin 20 mg daily which I will continue.  COVID-19 Education: The signs and symptoms of COVID-19 were discussed with the patient and how to seek care for testing (follow up with PCP or arrange E-visit).  The importance of social distancing was discussed today.  Patient Risk:   After full review of this patients  clinical status, I feel that they are at least moderate risk at this time.  Time:   Today, I have spent 18 minutes with the patient with telehealth technology discussing pt health issues.  I spent 39minutes reviewing her chart before the visit.  Visit was finished at 8:34 AM.    Medication Adjustments/Labs and Tests Ordered: Current medicines are reviewed at length with the patient today.  Concerns regarding medicines are outlined above.  No orders of the defined types were placed in this encounter.  Medication changes: No orders of the defined types were placed in this encounter.    Disposition: Follow-up 6 weeks.  0 patch for 2 weeks will be placed.  Signed, Park Liter, MD, [facility] 12/30/2018 8:35 AM    [place]

## 2019-01-10 ENCOUNTER — Other Ambulatory Visit (INDEPENDENT_AMBULATORY_CARE_PROVIDER_SITE_OTHER): Payer: Medicare Other

## 2019-01-10 DIAGNOSIS — R55 Syncope and collapse: Secondary | ICD-10-CM | POA: Diagnosis not present

## 2019-01-17 ENCOUNTER — Ambulatory Visit: Payer: Medicare Other | Admitting: Pulmonary Disease

## 2019-01-24 ENCOUNTER — Other Ambulatory Visit: Payer: Self-pay

## 2019-01-24 ENCOUNTER — Ambulatory Visit (HOSPITAL_BASED_OUTPATIENT_CLINIC_OR_DEPARTMENT_OTHER)
Admission: RE | Admit: 2019-01-24 | Discharge: 2019-01-24 | Disposition: A | Discharge status: Home / Self Care | Payer: Medicare Other | Source: Ambulatory Visit | Attending: Pulmonary Disease | Admitting: Pulmonary Disease

## 2019-01-24 DIAGNOSIS — R911 Solitary pulmonary nodule: Secondary | ICD-10-CM | POA: Diagnosis not present

## 2019-02-06 ENCOUNTER — Telehealth: Payer: Medicare Other | Admitting: Cardiology

## 2019-02-08 ENCOUNTER — Encounter: Payer: Self-pay | Admitting: Cardiology

## 2019-02-08 ENCOUNTER — Other Ambulatory Visit: Payer: Self-pay

## 2019-02-08 ENCOUNTER — Telehealth (INDEPENDENT_AMBULATORY_CARE_PROVIDER_SITE_OTHER): Payer: Medicare Other | Admitting: Cardiology

## 2019-02-08 DIAGNOSIS — J439 Emphysema, unspecified: Secondary | ICD-10-CM

## 2019-02-08 DIAGNOSIS — R55 Syncope and collapse: Secondary | ICD-10-CM | POA: Diagnosis not present

## 2019-02-08 DIAGNOSIS — E785 Hyperlipidemia, unspecified: Secondary | ICD-10-CM

## 2019-02-08 DIAGNOSIS — Z87891 Personal history of nicotine dependence: Secondary | ICD-10-CM

## 2019-02-08 NOTE — Progress Notes (Signed)
Virtual Visit via Video Note   This visit type was conducted due to national recommendations for restrictions regarding the COVID-19 Pandemic (e.g. social distancing) in an effort to limit this patient's exposure and mitigate transmission in our community.  Due to his co-morbid illnesses, this patient is at least at moderate risk for complications without adequate follow up.  This format is felt to be most appropriate for this patient at this time.  All issues noted in this document were discussed and addressed.  A limited physical exam was performed with this format.  Please refer to the patient's chart for his consent to telehealth for [facility].  Evaluation Performed:  Follow-up visit  This visit type was conducted due to national recommendations for restrictions regarding the COVID-19 Pandemic (e.g. social distancing).  This format is felt to be most appropriate for this patient at this time.  All issues noted in this document were discussed and addressed.  No physical exam was performed (except for noted visual exam findings with Video Visits).  Please refer to the patient's chart (MyChart message for video visits and phone note for telephone visits) for the patient's consent to telehealth for [facility].  Date:  02/08/2019  ID: Frank Meyer, DOB [birth year], MRN [id number]   Patient Location: [place] [address]   Provider location:   [place] Office  PCP:  Burnard Bunting, MD  Cardiologist:  Jenne Campus, MD     Chief Complaint: Doing very well  History of Present Illness:    Frank Meyer is a 78 y.o. male  who presents via audio/video conferencing for a telehealth visit today.  He was initially referred to me because of exertional shortness of breath.  Quite extensive evaluation has been done which include echocardiogram which showed normal left ventricular ejection fraction, also stress test was done.  During the stress test  it was noted that his right ventricle apparently was enlarged.  However further investigation did not confirm that.  Overall he is doing very well recently he ended up going to the hospital because of episodes of what appears to be altered mental status.  He took his usual medication to help him go to sleep which was trazodone and melatonin and then his wife had difficulty waking him up with couple minutes later.  He was confused.  Extensive evaluation has been done which was actually unrevealing.  I did put monitor on him to make sure there is no significant arrhythmia and the only thing identified was sinus bradycardia during the night rate of 36 as well as some very short nonsustained supraventricular tachycardias.   The patient does not have symptoms concerning for COVID-19 infection (fever, chills, cough, or new SHORTNESS OF BREATH).    Prior CV studies:   The following studies were reviewed today:  Monitor showed normal sinus rhythm with slowest heart rate 36, run of narrow complex tachycardia but very short lasting.     Past Medical History:  Diagnosis Date   Basal cell carcinoma    Benign prostatic hypertrophy    Dyslipidemia    Gallbladder problem    Gastroesophageal reflux disease    Hiatal hernia    Mild memory disturbance    TIA (transient ischemic attack)     Past Surgical History:  Procedure Laterality Date   none       Current Meds  Medication Sig   albuterol (PROVENTIL HFA;VENTOLIN HFA) 108 (90 Base) MCG/ACT inhaler Inhale 2 puffs  into the lungs every 6 (six) hours as needed for wheezing or shortness of breath.   aspirin 81 MG tablet Take 81 mg by mouth daily.    atorvastatin (LIPITOR) 20 MG tablet Take 20 mg by mouth daily.   cholecalciferol (VITAMIN D) 1000 UNITS tablet Take 1,000 Units by mouth daily.   donepezil (ARICEPT) 10 MG tablet Take 1 tablet (10 mg total) by mouth daily.   Fexofenadine-Pseudoephedrine (ALLEGRA-D 24 HOUR PO) Take 1  tablet by mouth daily.   finasteride (PROSCAR) 5 MG tablet Take 5 mg by mouth daily.   ipratropium (ATROVENT) 0.06 % nasal spray Place 2 sprays into both nostrils daily as needed for rhinitis.    Melatonin 10 MG TABS Take 10 mg by mouth at bedtime.   Multiple Vitamins-Minerals (MULTIVITAMIN ADULTS 50+) TABS Take 1 tablet by mouth daily.    omeprazole (PRILOSEC) 40 MG capsule Take 40 mg by mouth daily.    thiamine 100 MG tablet Take 1 tablet (100 mg total) by mouth daily.   traZODone (DESYREL) 100 MG tablet TAKE 2 TABLETS BY MOUTH AT  BEDTIME      Family History: The patient's family history includes Memory loss in his mother; Obesity in his mother.   ROS:   Please see the history of present illness.     All other systems reviewed and are negative.   Labs/Other Tests and Data Reviewed:     Recent Labs: 12/12/2018: ALT 30; BUN 16; Creatinine, Ser 1.10; Hemoglobin 13.7; Magnesium 2.3; Platelets 202; Potassium 3.9; Sodium 138; TSH 1.890  Recent Lipid Panel No results found for: CHOL, TRIG, HDL, CHOLHDL, VLDL, LDLCALC, LDLDIRECT    Exam:    Vital Signs:  There were no vitals taken for this visit.    Wt Readings from Last 3 Encounters:  12/30/18 160 lb (72.6 kg)  12/12/18 167 lb 8.8 oz (76 kg)  08/02/18 160 lb 1.9 oz (72.6 kg)     Well nourished, well developed in no acute distress. Alert awake oriented x3 happy to be able to talk to me over the video link.  He sitting at home having our office in [place].  Not in any distress cheerful and happy to be able to talk to me  Diagnosis for this visit:   1. Syncope and collapse   2. Pulmonary emphysema, unspecified emphysema type ([place])   3. Dyslipidemia   4. History of smoking      ASSESSMENT & PLAN:    1.  Syncope and collapse does not have any more so far work-up unrevealing which include to monitor.  It was most likely altered mental status may be related to medication that he takes to help him to sleep. 2.   Pulmonary edema overall he is doing well from that point of view he can play golf on a regular basis he also walk on the regular basis have no difficulty doing that.  Every single day he does between 10-15,000 steps.  Does get some shortness of breath climbing hills but overall that is unchanged. 3.  Dyslipidemia: On Lipitor 20 which I will continue.  Last fasting lipid acceptable. 4.  History of smoking does not smoke anymore  COVID-19 Education: The signs and symptoms of COVID-19 were discussed with the patient and how to seek care for testing (follow up with PCP or arrange E-visit).  The importance of social distancing was discussed today.  Patient Risk:   After full review of this patients clinical status, I feel that they  are at least moderate risk at this time.  Time:   Today, I have spent 18 minutes with the patient with telehealth technology discussing pt health issues.  I spent 5 minutes reviewing her chart before the visit.  Visit was finished at 9:20 AM.    Medication Adjustments/Labs and Tests Ordered: Current medicines are reviewed at length with the patient today.  Concerns regarding medicines are outlined above.  No orders of the defined types were placed in this encounter.  Medication changes: No orders of the defined types were placed in this encounter.    Disposition: Follow-up in 4 months  Signed, Park Liter, MD, [facility] 02/08/2019 8:17 AM    [place]

## 2019-02-08 NOTE — Patient Instructions (Signed)
Medication Instructions:  Your physician recommends that you continue on your current medications as directed. Please refer to the Current Medication list given to you today.  If you need a refill on your cardiac medications before your next appointment, please call your pharmacy.   Lab work: None.  If you have labs (blood work) drawn today and your tests are completely normal, you will receive your results only by: . MyChart Message (if you have MyChart) OR . A paper copy in the mail If you have any lab test that is abnormal or we need to change your treatment, we will call you to review the results.  Testing/Procedures: None.   Follow-Up: At [facility], you and your health needs are our priority.  As part of our continuing mission to provide you with exceptional heart care, we have created designated Provider Care Teams.  These Care Teams include your primary Cardiologist (physician) and Advanced Practice Providers (APPs -  Physician Assistants and Nurse Practitioners) who all work together to provide you with the care you need, when you need it. You will need a follow up appointment in 4 months.  Please call our office 2 months in advance to schedule this appointment.  You may see Robert Krasowski, MD or another member of our [facility] Provider Team in [place]: Brian Munley, MD . Rajan Revankar, MD  Any Other Special Instructions Will Be Listed Below (If Applicable).     

## 2019-03-15 ENCOUNTER — Ambulatory Visit: Payer: Medicare Other | Admitting: Pulmonary Disease

## 2019-03-28 ENCOUNTER — Ambulatory Visit: Payer: Medicare Other | Admitting: Pulmonary Disease

## 2019-03-29 ENCOUNTER — Ambulatory Visit: Payer: Medicare Other | Admitting: Pulmonary Disease

## 2019-03-29 ENCOUNTER — Encounter: Payer: Self-pay | Admitting: Pulmonary Disease

## 2019-03-29 ENCOUNTER — Other Ambulatory Visit: Payer: Self-pay

## 2019-03-29 VITALS — BP 126/78 | HR 58 | Temp 98.0°F | Ht 65.5 in | Wt 158.8 lb

## 2019-03-29 DIAGNOSIS — Z87891 Personal history of nicotine dependence: Secondary | ICD-10-CM

## 2019-03-29 DIAGNOSIS — J439 Emphysema, unspecified: Secondary | ICD-10-CM

## 2019-03-29 DIAGNOSIS — R0609 Other forms of dyspnea: Secondary | ICD-10-CM

## 2019-03-29 NOTE — Progress Notes (Signed)
Synopsis: Referred in August 2019 for pulmonary infiltrate and dyspnea on exertion by Burnard Bunting, MD  Subjective:   PATIENT ID: Frank Meyer GENDER: male DOB: 04-07-1941, MRN: [id number]  Chief Complaint  Patient presents with   Follow-up    F/U re: DOE. He reports he is not using his inhalers. Reports his SOB is worse after dinner and right at bedtime.     Initial OV: PMH GERD, TIA, hyperlipidemia.  C/o DOE worse with climbing elevation.  He exercises regularly obtaining 10-15,000 steps per day. Playing golf 2 times per week. Seen by cardiology for evaluation of dyspnea on exertion.  An echocardiogram was performed on 03/23/2018, with a preserved EF 55-60% and grade 1 diastolic dysfunction. Former smoker,  2 ppd, for 30 years, quit in 1992.   He has had SOB for the past 3-6 months. He notices it most with climbing hills while playing golf. No other aggravating symtpoms. Walking on treadmill he has no problem. No chest pain or tightness. Denies cough.  Stopping and resting his symptoms improved.  He also does not experience the symptoms as long as he is not on elevation.  OV 04/27/2018 - Patient was recently seen by cardiology. Stress testing was negative. Echo with enlarged RV consistent with underlying lung disease and possible exercise induced PH. PET scan completed since last office visit. Does have mild uptake in mediastinal and hilar adenopathy. New changes from prior imaging and radiology is recommending close follow up.  At this point the patient has been doing well.  He has noticed no change in respiratory symptoms with the addition of Anoro.  PFTs completed today in the office reveals normal ratio with normal FVC and FEV1.  Patient does not have evidence of obstruction.  He does however have a severely reduced DLCO.  With evidence seen on stress echo for enlargement of the right ventricle underlying pulmonary hypertension is possible.  This was discussed with the patient in the  office today.  OV 03/29/2019: Patient here today for follow-up regarding his CT images.  Upper lobe scarring.  Evidence of significant centrilobular and paraseptal emphysema.  CT images reviewed.  His respiratory symptoms are stable.  He is able to play golf 2-3 times a week.  He is not using his albuterol inhaler.  He thinks it cost too much and does not help him enough to make any difference.  He did not want to use any maintenance inhalers that he did in the past.  At this time he is continue to monitor his symptoms.  He had a recent visit with his primary care doctor.  Patient denies chest pain nausea vomiting diarrhea shortness of breath beyond his usual.  He does have exam dyspnea on exertion during the day if he is climbing a heel on the golf course otherwise doing well.   Past Medical History:  Diagnosis Date   Basal cell carcinoma    Benign prostatic hypertrophy    Dyslipidemia    Gallbladder problem    Gastroesophageal reflux disease    Hiatal hernia    Mild memory disturbance    TIA (transient ischemic attack)      Family History  Problem Relation Age of Onset   Obesity Mother    Memory loss Mother      Social History   Socioeconomic History   Marital status: Married    Spouse name: pamela   Number of children: 2   Years of education: college   Highest education level:  Not on file  Occupational History   Occupation: retired  Scientist, product/process development strain: Not on file   Food insecurity    Worry: Not on file    Inability: Not on file   Transportation needs    Medical: Not on file    Non-medical: Not on file  Tobacco Use   Smoking status: Former Smoker    Quit date: 05/04/1979    Years since quitting: 39.9   Smokeless tobacco: Never Used  Substance and Sexual Activity   Alcohol use: Yes    Alcohol/week: 15.0 - 20.0 standard drinks    Types: 15 - 20 Cans of beer per week   Drug use: Never   Sexual activity: Not on file    Lifestyle   Physical activity    Days per week: Not on file    Minutes per session: Not on file   Stress: Not on file  Relationships   Social connections    Talks on phone: Not on file    Gets together: Not on file    Attends religious service: Not on file    Active member of club or organization: Not on file    Attends meetings of clubs or organizations: Not on file    Relationship status: Not on file   Intimate partner violence    Fear of current or ex partner: Not on file    Emotionally abused: Not on file    Physically abused: Not on file    Forced sexual activity: Not on file  Other Topics Concern   Not on file  Social History Narrative   Pt is retired. He is married. The pt lives with his wife. He has a Best boy. The pt has 2 children.      Patient drinks 2-3 cups of caffeine daily.   Patient is right handed.      No Known Allergies   Outpatient Medications Prior to Visit  Medication Sig Dispense Refill   albuterol (PROVENTIL HFA;VENTOLIN HFA) 108 (90 Base) MCG/ACT inhaler Inhale 2 puffs into the lungs every 6 (six) hours as needed for wheezing or shortness of breath. 1 Inhaler 6   aspirin 81 MG tablet Take 81 mg by mouth daily.      atorvastatin (LIPITOR) 20 MG tablet Take 20 mg by mouth daily.     cholecalciferol (VITAMIN D) 1000 UNITS tablet Take 1,000 Units by mouth daily.     donepezil (ARICEPT) 10 MG tablet Take 1 tablet (10 mg total) by mouth daily. 90 tablet 3   Fexofenadine-Pseudoephedrine (ALLEGRA-D 24 HOUR PO) Take 1 tablet by mouth daily.     finasteride (PROSCAR) 5 MG tablet Take 5 mg by mouth daily.     ipratropium (ATROVENT) 0.06 % nasal spray Place 2 sprays into both nostrils daily as needed for rhinitis.      Melatonin 10 MG TABS Take 10 mg by mouth at bedtime.     Multiple Vitamins-Minerals (MULTIVITAMIN ADULTS 50+) TABS Take 1 tablet by mouth daily.      omeprazole (PRILOSEC) 40 MG capsule Take 40 mg by mouth daily.       thiamine 100 MG tablet Take 1 tablet (100 mg total) by mouth daily. 30 tablet 0   traZODone (DESYREL) 100 MG tablet TAKE 2 TABLETS BY MOUTH AT  BEDTIME 180 tablet 1   No facility-administered medications prior to visit.     Review of Systems  Constitutional: Negative for chills, fever, malaise/fatigue and weight loss.  HENT: Negative for hearing loss, sore throat and tinnitus.   Eyes: Negative for blurred vision and double vision.  Respiratory: Positive for shortness of breath. Negative for cough, hemoptysis, sputum production, wheezing and stridor.   Cardiovascular: Negative for chest pain, palpitations, orthopnea, leg swelling and PND.  Gastrointestinal: Negative for abdominal pain, constipation, diarrhea, heartburn, nausea and vomiting.  Genitourinary: Negative for dysuria, hematuria and urgency.  Musculoskeletal: Negative for joint pain and myalgias.  Skin: Negative for itching and rash.  Neurological: Negative for dizziness, tingling, weakness and headaches.  Endo/Heme/Allergies: Negative for environmental allergies. Does not bruise/bleed easily.  Psychiatric/Behavioral: Negative for depression. The patient is not nervous/anxious and does not have insomnia.   All other systems reviewed and are negative.    Objective:  Physical Exam Vitals signs reviewed.  Constitutional:      General: He is not in acute distress.    Appearance: He is well-developed.  HENT:     Head: Normocephalic and atraumatic.  Eyes:     General: No scleral icterus.    Conjunctiva/sclera: Conjunctivae normal.     Pupils: Pupils are equal, round, and reactive to light.  Neck:     Musculoskeletal: Neck supple.     Vascular: No JVD.     Trachea: No tracheal deviation.  Cardiovascular:     Rate and Rhythm: Normal rate and regular rhythm.     Heart sounds: Normal heart sounds. No murmur.  Pulmonary:     Effort: Pulmonary effort is normal. No tachypnea, accessory muscle usage or respiratory distress.      Breath sounds: No stridor. No wheezing, rhonchi or rales.     Comments: Diminished breath sounds bilaterally Abdominal:     General: Bowel sounds are normal. There is no distension.     Palpations: Abdomen is soft.     Tenderness: There is no abdominal tenderness.  Musculoskeletal:        General: No tenderness.  Lymphadenopathy:     Cervical: No cervical adenopathy.  Skin:    General: Skin is warm and dry.     Capillary Refill: Capillary refill takes less than 2 seconds.     Findings: No rash.  Neurological:     Mental Status: He is alert and oriented to person, place, and time.  Psychiatric:        Behavior: Behavior normal.      Vitals:   03/29/19 1143  BP: 126/78  Pulse: (!) 58  Temp: 98 F (36.7 C)  TempSrc: Oral  SpO2: 94%  Weight: 158 lb 12.8 oz (72 kg)  Height: 5' 5.5" (1.664 m)   94% on RA BMI Readings from Last 3 Encounters:  03/29/19 26.02 kg/m  12/30/18 25.06 kg/m  12/12/18 26.24 kg/m   Wt Readings from Last 3 Encounters:  03/29/19 158 lb 12.8 oz (72 kg)  12/30/18 160 lb (72.6 kg)  12/12/18 167 lb 8.8 oz (76 kg)    CBC    Component Value Date/Time   WBC 7.4 12/12/2018 0314   RBC 4.71 12/12/2018 0314   HGB 13.7 12/12/2018 0314   HCT 42.0 12/12/2018 0314   PLT 202 12/12/2018 0314   MCV 89.2 12/12/2018 0314   MCH 29.1 12/12/2018 0314   MCHC 32.6 12/12/2018 0314   RDW 13.2 12/12/2018 0314   LYMPHSABS 2.0 12/12/2018 0054   MONOABS 0.7 12/12/2018 0054   EOSABS 0.6 (H) 12/12/2018 0054   BASOSABS 0.1 12/12/2018 0054    New labs pending  Chest Imaging: CT imaging with right  upper lobe lesion with associated right hilar and mediastinal adenopathy.  He does have lung nodules within the right lower lobe and left upper lobe.  He has significant centrilobular and paraseptal emphysema.  PET Imaging: IMPRESSION: 1. Low level FDG accumulation in the borderline enlarged mediastinal and right hilar lymph nodes. Nonspecific. 2. Tiny bilateral  pulmonary nodules have decreased in the interval. 3. New areas of ground-glass attenuation in both lower lobes showing associated FDG uptake. Disease in the medial left lower lobe is most hypermetabolic were there is an area of somewhat more focal ground-glass density. The ground-glass attenuation is new and or progressive in the interval. Changes may well be infectious/inflammatory, but close follow-up recommended to exclude neoplasm. 4.  Emphysema. (ICD10-J43.9) 5.  Aortic Atherosclerois (ICD10-170.0)  The patient's images have been independently reviewed by me.    Pulmonary Functions Testing Results: 04/27/2018: FEV1 3.2 L, 130% predicted FVC 4.07 L, 118% predicted, postbronchodilator Ratio 78 RV/TLC 61% predicted, DLCO 44% predicted.  FeNO: None  Pathology: None   Echocardiogram:   03/23/2018 Study Conclusions - Left ventricle: The cavity size was normal. Systolic function was   normal. The estimated ejection fraction was in the range of 55%   to 60%. Wall motion was normal; there were no regional wall   motion abnormalities. Doppler parameters are consistent with   abnormal left ventricular relaxation (grade 1 diastolic   dysfunction). - Aortic valve: There was trivial regurgitation. - Mitral valve: There was mild regurgitation. - Pulmonary arteries: PA peak pressure: 31 mm Hg (S). Impressions: - Normal LVEF.   Ascending aorte - 4.0 cm   Trace AR.  03/29/2018- Echo Stress - Stress ECG conclusions: There were no stress arrhythmias or   conduction abnormalities. The stress ECG was normal. - Staged echo: There was no echocardiographic evidence for   stress-induced ischemia. - Immediate post stress: RV size was moderately enlarged. RV global   systolic function was mildly to moderately depressed by visual   assessment with a D shaped septum. There was no TR to estimate RV   pressure. - On review of his TTE last week the RV is dilated.  Heart Catheterization: None       Assessment & Plan:     ICD-10-CM   1. Pulmonary emphysema, unspecified emphysema type ([place])  J43.9   2. History of smoking  Z87.891   3. Dyspnea on exertion  R06.09     Discussion:  78 year old gentleman longstanding smoking history quit several years ago.  He originally was seen with his right upper lobe consolidative lesion with scarring and some soft tissue component.  He also had very significant paraseptal and centrilobular emphysema.  Pet imaging had low level uptake in the lesion.  We had follow-up imaging that we have continue to watch.  We discussed alternatives to include tissue sampling versus bronchoscopy.  We have opted for radiographic follow-up. Imaging again today reveals stability of the upper lobe abnormality. Additionally he has very significant centrilobular paraseptal emphysema and may be some early areas of interstitial changes and fibrosis in the bases.  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  In a mixed pattern His PFTs that were completed on his last office visit with me revealed but no obstruction normal FEV1 FVC ratio. But he does have very significant disease on CAT scan imaging.  Echocardiogram did reveal evidence of pulmonary hypertension but I suspect this is related to his underlying lung disease. At this time he is not  interested in using any inhalers to make his symptoms better.  We discussed various options today.  He thinks that are too expensive and he does not feel like it made any difference therefore he will see how he does without anything.  Therefore I think the best thing for him to do is to call us and let us know if he is having any additional symptoms that we can help manage. He will return to clinic in 1 year or as needed. At that time may need to consider repeat imaging to see if he has had any additional progression of the parenchymal changes seen on CT.  Greater than 50% of this patient 25-minute  of visit was spent face-to-face discussing above recommendations and treatment plan.  We also reviewed patient's CT imaging in the office today.   Current Outpatient Medications:    albuterol (PROVENTIL HFA;VENTOLIN HFA) 108 (90 Base) MCG/ACT inhaler, Inhale 2 puffs into the lungs every 6 (six) hours as needed for wheezing or shortness of breath., Disp: 1 Inhaler, Rfl: 6   aspirin 81 MG tablet, Take 81 mg by mouth daily. , Disp: , Rfl:    atorvastatin (LIPITOR) 20 MG tablet, Take 20 mg by mouth daily., Disp: , Rfl:    cholecalciferol (VITAMIN D) 1000 UNITS tablet, Take 1,000 Units by mouth daily., Disp: , Rfl:    donepezil (ARICEPT) 10 MG tablet, Take 1 tablet (10 mg total) by mouth daily., Disp: 90 tablet, Rfl: 3   Fexofenadine-Pseudoephedrine (ALLEGRA-D 24 HOUR PO), Take 1 tablet by mouth daily., Disp: , Rfl:    finasteride (PROSCAR) 5 MG tablet, Take 5 mg by mouth daily., Disp: , Rfl:    ipratropium (ATROVENT) 0.06 % nasal spray, Place 2 sprays into both nostrils daily as needed for rhinitis. , Disp: , Rfl:    Melatonin 10 MG TABS, Take 10 mg by mouth at bedtime., Disp: , Rfl:    Multiple Vitamins-Minerals (MULTIVITAMIN ADULTS 50+) TABS, Take 1 tablet by mouth daily. , Disp: , Rfl:    omeprazole (PRILOSEC) 40 MG capsule, Take 40 mg by mouth daily. , Disp: , Rfl:    thiamine 100 MG tablet, Take 1 tablet (100 mg total) by mouth daily., Disp: 30 tablet, Rfl: 0   traZODone (DESYREL) 100 MG tablet, TAKE 2 TABLETS BY MOUTH AT  BEDTIME, Disp: 180 tablet, Rfl: 1   Garner Nash, DO [facility] 03/29/2019 11:49 AM

## 2019-03-29 NOTE — Patient Instructions (Signed)
Thank you for visiting Dr. Valeta Harms at [facility]. Today we recommend the following:  Return in about 1 year (around 03/28/2020), or if symptoms worsen or fail to improve.  Let us know if you are having any trouble.   Follow up with PCP regarding aorta screening.    Please do your part to reduce the spread of COVID-19.

## 2019-04-20 ENCOUNTER — Other Ambulatory Visit: Payer: Self-pay | Admitting: Adult Health

## 2019-05-23 ENCOUNTER — Ambulatory Visit: Payer: Medicare Other | Admitting: Cardiology

## 2019-05-30 ENCOUNTER — Ambulatory Visit: Payer: Medicare Other | Admitting: Adult Health

## 2019-05-30 ENCOUNTER — Other Ambulatory Visit: Payer: Self-pay

## 2019-05-30 ENCOUNTER — Encounter: Payer: Self-pay | Admitting: Adult Health

## 2019-05-30 VITALS — BP 111/68 | HR 53 | Temp 98.5°F | Ht 67.0 in | Wt 159.2 lb

## 2019-05-30 DIAGNOSIS — G4700 Insomnia, unspecified: Secondary | ICD-10-CM

## 2019-05-30 DIAGNOSIS — R413 Other amnesia: Secondary | ICD-10-CM | POA: Diagnosis not present

## 2019-05-30 MED ORDER — TRAZODONE HCL 100 MG PO TABS: 200.0000 mg | ORAL_TABLET | Freq: Every day | ORAL | 1 refills | Status: DC

## 2019-05-30 NOTE — Patient Instructions (Signed)
Your Plan:  Continue Aricept and Trazodone Continue Sonata as needed If your symptoms worsen or you develop new symptoms please let us know.   Thank you for coming to see [place] at [facility]. I hope we have been able to provide you high quality care today.  You may receive a patient satisfaction survey over the next few weeks. We would appreciate your feedback and comments so that we may continue to improve ourselves and the health of our patients.

## 2019-05-30 NOTE — Progress Notes (Signed)
PATIENT: Frank Meyer DOB: [birth year]  REASON FOR VISIT: follow up HISTORY FROM: patient  HISTORY OF PRESENT ILLNESS: Today 05/30/19:  Frank Meyer is a 78 year old male with a history of memory disturbance.  He returns today for follow-up.  Overall he feels that he is doing well.  He continues on Aricept.  He feels that his memory has remained relatively stable.  He is able to complete all ADLs independently.  He does operate a motor vehicle but reports that he typically use Google maps to be on the safe side.  He manages his own finances.  Denies any trouble sleeping.  He continues to play golf.  His wife does most of the cooking.  He continues to take trazodone at bedtime.  He states on occasion if he wakes up during the night and unable to fall back to sleep he will use Sonata.  He reports that Sonata only keeps him asleep for 2 or 3 hours.  He returns today for an evaluation.  HISTORY 05/17/18:  Frank Meyer is a 78 year old male with a history of memory disturbance.  He returns today for follow-up.  He continues to live at home with his wife.  His wife feels that his memory has gotten slightly worse although he has not seen a difference.  He is able to complete all ADLs independently.  He continues to use his GPS when driving.  He reports that he plays golf twice a week.  He continues to manage all their finances and investments without difficulty.  He reports that he takes trazodone nightly to help with sleep.  He reports on occasion he will have to take Sonata 1-2 times a week if he wakes up and is unable to go back to sleep.  He denies any changes in his mood or behavior.  He continues on Aricept 10 mg at bedtime.  REVIEW OF SYSTEMS: Out of a complete 14 system review of symptoms, the patient complains only of the following symptoms, and all other reviewed systems are negative.  See HPI  ALLERGIES: No Known Allergies  HOME MEDICATIONS: Outpatient Medications Prior to Visit   Medication Sig Dispense Refill  . aspirin 81 MG tablet Take 81 mg by mouth daily.     Marland Kitchen atorvastatin (LIPITOR) 20 MG tablet Take 20 mg by mouth daily.    . cholecalciferol (VITAMIN D) 1000 UNITS tablet Take 1,000 Units by mouth daily.    Marland Kitchen donepezil (ARICEPT) 10 MG tablet TAKE 1 TABLET BY MOUTH  DAILY 90 tablet 3  . Fexofenadine-Pseudoephedrine (ALLEGRA-D 24 HOUR PO) Take 1 tablet by mouth daily.    . finasteride (PROSCAR) 5 MG tablet Take 5 mg by mouth daily.    Marland Kitchen ipratropium (ATROVENT) 0.06 % nasal spray Place 2 sprays into both nostrils daily as needed for rhinitis.     . Melatonin 10 MG TABS Take 10 mg by mouth at bedtime.    . Multiple Vitamins-Minerals (MULTIVITAMIN ADULTS 50+) TABS Take 1 tablet by mouth daily.     Marland Kitchen omeprazole (PRILOSEC) 40 MG capsule Take 40 mg by mouth daily.     Marland Kitchen thiamine 100 MG tablet Take 1 tablet (100 mg total) by mouth daily. 30 tablet 0  . traZODone (DESYREL) 100 MG tablet TAKE 2 TABLETS BY MOUTH AT  BEDTIME 180 tablet 1  . zaleplon (SONATA) 10 MG capsule Take 10 mg by mouth at bedtime as needed for sleep.    Marland Kitchen albuterol (PROVENTIL HFA;VENTOLIN HFA) 108 (90 Base)  MCG/ACT inhaler Inhale 2 puffs into the lungs every 6 (six) hours as needed for wheezing or shortness of breath. 1 Inhaler 6   No facility-administered medications prior to visit.     PAST MEDICAL HISTORY: Past Medical History:  Diagnosis Date  . Basal cell carcinoma   . Benign prostatic hypertrophy   . Dyslipidemia   . Gallbladder problem   . Gastroesophageal reflux disease   . Hiatal hernia   . Mild memory disturbance   . TIA (transient ischemic attack)     PAST SURGICAL HISTORY: Past Surgical History:  Procedure Laterality Date  . none      FAMILY HISTORY: Family History  Problem Relation Age of Onset  . Obesity Mother   . Memory loss Mother     SOCIAL HISTORY: Social History   Socioeconomic History  . Marital status: Married    Spouse name: pamela  . Number of  children: 2  . Years of education: college  . Highest education level: Not on file  Occupational History  . Occupation: retired  Scientific laboratory technician  . Financial resource strain: Not on file  . Food insecurity    Worry: Not on file    Inability: Not on file  . Transportation needs    Medical: Not on file    Non-medical: Not on file  Tobacco Use  . Smoking status: Former Smoker    Quit date: 05/04/1979    Years since quitting: 40.0  . Smokeless tobacco: Never Used  Substance and Sexual Activity  . Alcohol use: Yes    Alcohol/week: 15.0 - 20.0 standard drinks    Types: 15 - 20 Cans of beer per week  . Drug use: Never  . Sexual activity: Not on file  Lifestyle  . Physical activity    Days per week: Not on file    Minutes per session: Not on file  . Stress: Not on file  Relationships  . Social Herbalist on phone: Not on file    Gets together: Not on file    Attends religious service: Not on file    Active member of club or organization: Not on file    Attends meetings of clubs or organizations: Not on file    Relationship status: Not on file  . Intimate partner violence    Fear of current or ex partner: Not on file    Emotionally abused: Not on file    Physically abused: Not on file    Forced sexual activity: Not on file  Other Topics Concern  . Not on file  Social History Narrative   Pt is retired. He is married. The pt lives with his wife. He has a Best boy. The pt has 2 children.      Patient drinks 2-3 cups of caffeine daily.   Patient is right handed.       PHYSICAL EXAM  Vitals:   05/30/19 1018  BP: 111/68  Pulse: (!) 53  Temp: 98.5 F (36.9 C)  TempSrc: Oral  Weight: 159 lb 3.2 oz (72.2 kg)  Height: 5\' 7"  (1.702 m)   Body mass index is 24.93 kg/m.   MMSE - Mini Mental State Exam 05/30/2019 05/17/2018 05/11/2017  Not completed: (No Data) (No Data) -  Orientation to time 4 3 4   Orientation to Place 5 5 5   Registration 3 3 3    Attention/ Calculation 5 5 5   Recall 2 2 3   Language- name 2 objects 2 2 2  Language- repeat 1 1 1   Language- follow 3 step command 3 3 3   Language- read & follow direction 1 1 1   Write a sentence 1 1 1   Copy design 1 1 1   Total score 28 27 29      Generalized: Well developed, in no acute distress   Neurological examination  Mentation: Alert oriented to time, place, history taking. Follows all commands speech and language fluent Cranial nerve II-XII: Pupils were equal round reactive to light. Extraocular movements were full, visual field were full on confrontational test.  Head turning and shoulder shrug  were normal and symmetric. Motor: The motor testing reveals 5 over 5 strength of all 4 extremities. Good symmetric motor tone is noted throughout.  Sensory: Sensory testing is intact to soft touch on all 4 extremities. No evidence of extinction is noted.  Coordination: Cerebellar testing reveals good finger-nose-finger and heel-to-shin bilaterally.  Gait and station: Gait is normal.  Reflexes: Deep tendon reflexes are symmetric and normal bilaterally.   DIAGNOSTIC DATA (LABS, IMAGING, TESTING) - I reviewed patient records, labs, notes, testing and imaging myself where available.  Lab Results  Component Value Date   WBC 7.4 12/12/2018   HGB 13.7 12/12/2018   HCT 42.0 12/12/2018   MCV 89.2 12/12/2018   PLT 202 12/12/2018      Component Value Date/Time   NA 138 12/12/2018 0314   K 3.9 12/12/2018 0314   CL 103 12/12/2018 0314   CO2 25 12/12/2018 0314   GLUCOSE 98 12/12/2018 0314   BUN 16 12/12/2018 0314   CREATININE 1.10 12/12/2018 0314   CALCIUM 8.5 (L) 12/12/2018 0314   PROT 6.4 (L) 12/12/2018 0054   ALBUMIN 3.1 (L) 12/12/2018 0054   AST 32 12/12/2018 0054   ALT 30 12/12/2018 0054   ALKPHOS 54 12/12/2018 0054   BILITOT 0.6 12/12/2018 0054   GFRNONAA >60 12/12/2018 0314   GFRAA >60 12/12/2018 0314   Lab Results  Component Value Date   TSH 1.890 12/12/2018       ASSESSMENT AND PLAN 78 y.o. year old male  has a past medical history of Basal cell carcinoma, Benign prostatic hypertrophy, Dyslipidemia, Gallbladder problem, Gastroesophageal reflux disease, Hiatal hernia, Mild memory disturbance, and TIA (transient ischemic attack). here with:  1.  Memory disturbance 2.  Insomnia  The patient's memory score has remained relatively stable.  The patient remains concerned as he has a family history of Alzheimer's disease.  However we have not seen any progression of his memory disturbance.  He will remain on Aricept.  He will continue on trazodone and Sonata for insomnia.  He will follow-up in 6 months or sooner if needed.     Frank Givens, Frank Meyer, Frank Meyer 05/30/2019, 10:45 AM [facility] [address], [place], [place] [address] [phone]

## 2019-05-31 ENCOUNTER — Encounter: Payer: Self-pay | Admitting: Cardiology

## 2019-05-31 ENCOUNTER — Ambulatory Visit: Payer: Medicare Other | Admitting: Cardiology

## 2019-05-31 ENCOUNTER — Ambulatory Visit (INDEPENDENT_AMBULATORY_CARE_PROVIDER_SITE_OTHER): Payer: Medicare Other | Admitting: Cardiology

## 2019-05-31 ENCOUNTER — Other Ambulatory Visit: Payer: Self-pay

## 2019-05-31 VITALS — BP 116/64 | HR 55 | Ht 67.0 in | Wt 157.0 lb

## 2019-05-31 DIAGNOSIS — R0609 Other forms of dyspnea: Secondary | ICD-10-CM

## 2019-05-31 DIAGNOSIS — E785 Hyperlipidemia, unspecified: Secondary | ICD-10-CM

## 2019-05-31 DIAGNOSIS — R0600 Dyspnea, unspecified: Secondary | ICD-10-CM

## 2019-05-31 DIAGNOSIS — J439 Emphysema, unspecified: Secondary | ICD-10-CM | POA: Diagnosis not present

## 2019-05-31 DIAGNOSIS — R55 Syncope and collapse: Secondary | ICD-10-CM | POA: Diagnosis not present

## 2019-05-31 NOTE — Patient Instructions (Signed)
Medication Instructions:  Your physician recommends that you continue on your current medications as directed. Please refer to the Current Medication list given to you today.  If you need a refill on your cardiac medications before your next appointment, please call your pharmacy.   Lab work: None.  If you have labs (blood work) drawn today and your tests are completely normal, you will receive your results only by: Marland Kitchen MyChart Message (if you have MyChart) OR . A paper copy in the mail If you have any lab test that is abnormal or we need to change your treatment, we will call you to review the results.  Testing/Procedures: None.   Follow-Up: At [facility], you and your health needs are our priority.  As part of our continuing mission to provide you with exceptional heart care, we have created designated Provider Care Teams.  These Care Teams include your primary Cardiologist (physician) and Advanced Practice Providers (APPs -  Physician Assistants and Nurse Practitioners) who all work together to provide you with the care you need, when you need it. You will need a follow up appointment in 5 months.  Please call our office 2 months in advance to schedule this appointment.  You may see Jenne Campus, MD or another member of our [place] Provider Team in [place]: Shirlee More, MD . Jyl Heinz, MD  Any Other Special Instructions Will Be Listed Below (If Applicable).

## 2019-05-31 NOTE — Progress Notes (Signed)
Cardiology Office Note:    Date:  05/31/2019   ID:  Frank Meyer, DOB 1940-08-28, MRN [id number]  PCP:  Burnard Bunting, MD  Cardiologist:  Jenne Campus, MD    Referring MD: Burnard Bunting, MD   Chief Complaint  Patient presents with  . Follow-up  Doing well  History of Present Illness:    Frank Meyer is a 78 y.o. male with dyspnea on exertion.  There is some issue about potentially having pulmonary hypertension.  Overall doing very well still very active walks a lot every single day 10-15,000 steps he had no difficulty doing it he did not notice any decrease in his ability to do his exercises.  Also he had some episode of altered mental status there was some concern about him being bradycardic however whenever test we done so far did not show anything alarming.  Past Medical History:  Diagnosis Date  . Basal cell carcinoma   . Benign prostatic hypertrophy   . Dyslipidemia   . Gallbladder problem   . Gastroesophageal reflux disease   . Hiatal hernia   . Mild memory disturbance   . TIA (transient ischemic attack)     Past Surgical History:  Procedure Laterality Date  . none      Current Medications: Current Meds  Medication Sig  . aspirin 81 MG tablet Take 81 mg by mouth daily.   Marland Kitchen atorvastatin (LIPITOR) 20 MG tablet Take 20 mg by mouth daily.  . cholecalciferol (VITAMIN D) 1000 UNITS tablet Take 1,000 Units by mouth daily.  Marland Kitchen donepezil (ARICEPT) 10 MG tablet TAKE 1 TABLET BY MOUTH  DAILY  . Fexofenadine-Pseudoephedrine (ALLEGRA-D 24 HOUR PO) Take 1 tablet by mouth daily.  . finasteride (PROSCAR) 5 MG tablet Take 5 mg by mouth daily.  Marland Kitchen ipratropium (ATROVENT) 0.06 % nasal spray Place 2 sprays into both nostrils daily as needed for rhinitis.   . Melatonin 10 MG TABS Take 10 mg by mouth at bedtime.  . Multiple Vitamins-Minerals (MULTIVITAMIN ADULTS 50+) TABS Take 1 tablet by mouth daily.   Marland Kitchen omeprazole (PRILOSEC) 40 MG capsule Take 40 mg by mouth  daily.   Marland Kitchen thiamine 100 MG tablet Take 1 tablet (100 mg total) by mouth daily.  . traZODone (DESYREL) 100 MG tablet Take 2 tablets (200 mg total) by mouth at bedtime.  . zaleplon (SONATA) 10 MG capsule Take 10 mg by mouth at bedtime as needed for sleep.     Allergies:   Patient has no known allergies.   Social History   Socioeconomic History  . Marital status: Married    Spouse name: pamela  . Number of children: 2  . Years of education: college  . Highest education level: Not on file  Occupational History  . Occupation: retired  Scientific laboratory technician  . Financial resource strain: Not on file  . Food insecurity    Worry: Not on file    Inability: Not on file  . Transportation needs    Medical: Not on file    Non-medical: Not on file  Tobacco Use  . Smoking status: Former Smoker    Quit date: 05/04/1979    Years since quitting: 40.1  . Smokeless tobacco: Never Used  Substance and Sexual Activity  . Alcohol use: Yes    Alcohol/week: 15.0 - 20.0 standard drinks    Types: 15 - 20 Cans of beer per week  . Drug use: Never  . Sexual activity: Not on file  Lifestyle  . Physical  activity    Days per week: Not on file    Minutes per session: Not on file  . Stress: Not on file  Relationships  . Social Herbalist on phone: Not on file    Gets together: Not on file    Attends religious service: Not on file    Active member of club or organization: Not on file    Attends meetings of clubs or organizations: Not on file    Relationship status: Not on file  Other Topics Concern  . Not on file  Social History Narrative   Pt is retired. He is married. The pt lives with his wife. He has a Best boy. The pt has 2 children.      Patient drinks 2-3 cups of caffeine daily.   Patient is right handed.      Family History: The patient's family history includes Memory loss in his mother; Obesity in his mother. ROS:   Please see the history of present illness.    All 14 point  review of systems negative except as described per history of present illness  EKGs/Labs/Other Studies Reviewed:      Recent Labs: 12/12/2018: ALT 30; BUN 16; Creatinine, Ser 1.10; Hemoglobin 13.7; Magnesium 2.3; Platelets 202; Potassium 3.9; Sodium 138; TSH 1.890  Recent Lipid Panel No results found for: CHOL, TRIG, HDL, CHOLHDL, VLDL, LDLCALC, LDLDIRECT  Physical Exam:    VS:  BP 116/64   Pulse (!) 55   Ht 5\' 7"  (1.702 m)   Wt 157 lb (71.2 kg)   SpO2 93%   BMI 24.59 kg/m     Wt Readings from Last 3 Encounters:  05/31/19 157 lb (71.2 kg)  05/30/19 159 lb 3.2 oz (72.2 kg)  03/29/19 158 lb 12.8 oz (72 kg)     GEN:  Well nourished, well developed in no acute distress HEENT: Normal NECK: No JVD; No carotid bruits LYMPHATICS: No lymphadenopathy CARDIAC: RRR, no murmurs, no rubs, no gallops RESPIRATORY:  Clear to auscultation without rales, wheezing or rhonchi  ABDOMEN: Soft, non-tender, non-distended MUSCULOSKELETAL:  No edema; No deformity  SKIN: Warm and dry LOWER EXTREMITIES: no swelling NEUROLOGIC:  Alert and oriented x 3 PSYCHIATRIC:  Normal affect   ASSESSMENT:    1. Syncope and collapse   2. Pulmonary emphysema, unspecified emphysema type ([place])   3. Dyspnea on exertion   4. Dyslipidemia    PLAN:    In order of problems listed above:  1. Syncope and collapse denies having any doing well overall 2. Pulmonary emphysema stable 3. Dyspnea on exertion doing well exercise on a regular basis with no difficulties 4. Dyslipidemia on medications which I will continue   Medication Adjustments/Labs and Tests Ordered: Current medicines are reviewed at length with the patient today.  Concerns regarding medicines are outlined above.  No orders of the defined types were placed in this encounter.  Medication changes: No orders of the defined types were placed in this encounter.   Signed, Park Liter, MD, [facility] 05/31/2019 11:45 AM    [place]

## 2019-09-25 ENCOUNTER — Ambulatory Visit: Payer: Medicare Other | Attending: Internal Medicine

## 2019-09-25 DIAGNOSIS — Z23 Encounter for immunization: Secondary | ICD-10-CM | POA: Insufficient documentation

## 2019-09-25 NOTE — Progress Notes (Signed)
   Covid-19 Vaccination Clinic  Name:  Frank Meyer    MRN: [id number] DOB: May 22, 1941  09/25/2019  Mr. Odam was observed post Covid-19 immunization for 15 minutes without incidence. He was provided with Vaccine Information Sheet and instruction to access the V-Safe system.   Mr. Frasier was instructed to call 911 with any severe reactions post vaccine: Marland Kitchen Difficulty breathing  . Swelling of your face and throat  . A fast heartbeat  . A bad rash all over your body  . Dizziness and weakness    Immunizations Administered    Name Date Dose VIS Date Route   Pfizer COVID-19 Vaccine 09/25/2019  4:05 PM 0.3 mL 07/28/2019 Intramuscular   Manufacturer: [place]   Lot: [id number]   [place]: [id number]

## 2019-10-20 ENCOUNTER — Ambulatory Visit: Payer: Medicare Other | Attending: Internal Medicine

## 2019-10-20 DIAGNOSIS — Z23 Encounter for immunization: Secondary | ICD-10-CM

## 2019-10-20 NOTE — Progress Notes (Signed)
   Covid-19 Vaccination Clinic  Name:  Frank Meyer    MRN: [id number] DOB: 1940-11-19  10/20/2019  [name] was observed post Covid-19 immunization for 15 minutes without incident. He was provided with Vaccine Information Sheet and instruction to access the V-Safe system.   [name] was instructed to call 911 with any severe reactions post vaccine: Marland Kitchen Difficulty breathing  . Swelling of face and throat  . A fast heartbeat  . A bad rash all over body  . Dizziness and weakness   Immunizations Administered    Name Date Dose VIS Date Route   Pfizer COVID-19 Vaccine 10/20/2019  2:59 PM 0.3 mL 07/28/2019 Intramuscular   Manufacturer: [place]   Lot: [id number]   [place]: [id number]

## 2019-10-22 ENCOUNTER — Other Ambulatory Visit: Payer: Self-pay | Admitting: Adult Health

## 2019-12-06 ENCOUNTER — Other Ambulatory Visit: Payer: Self-pay

## 2019-12-06 ENCOUNTER — Ambulatory Visit: Payer: Medicare Other | Admitting: Cardiology

## 2019-12-06 ENCOUNTER — Encounter: Payer: Self-pay | Admitting: Cardiology

## 2019-12-06 VITALS — BP 120/70 | HR 64 | Ht 67.0 in | Wt 158.0 lb

## 2019-12-06 DIAGNOSIS — R413 Other amnesia: Secondary | ICD-10-CM | POA: Diagnosis not present

## 2019-12-06 DIAGNOSIS — R001 Bradycardia, unspecified: Secondary | ICD-10-CM

## 2019-12-06 DIAGNOSIS — R0609 Other forms of dyspnea: Secondary | ICD-10-CM

## 2019-12-06 DIAGNOSIS — E785 Hyperlipidemia, unspecified: Secondary | ICD-10-CM

## 2019-12-06 DIAGNOSIS — R55 Syncope and collapse: Secondary | ICD-10-CM | POA: Diagnosis not present

## 2019-12-06 DIAGNOSIS — I272 Pulmonary hypertension, unspecified: Secondary | ICD-10-CM

## 2019-12-06 DIAGNOSIS — R0600 Dyspnea, unspecified: Secondary | ICD-10-CM

## 2019-12-06 HISTORY — DX: Bradycardia, unspecified: R00.1

## 2019-12-06 NOTE — Progress Notes (Signed)
Cardiology Office Note:    Date:  12/06/2019   ID:  Frank Meyer, DOB [birth year], MRN [id number]  PCP:  Burnard Bunting, MD  Cardiologist:  Jenne Campus, MD    Referring MD: Burnard Bunting, MD   Chief Complaint  Patient presents with  . Follow-up  Doing fine  History of Present Illness:    Callin DAYMON BUCHHOLTZ is a 79 y.o. male who was referred to [place] because of shortness of breath.  There is some question about pulmonary hypertension however latest echocardiogram did not show significant pulmonary hypertension.  Also history of bradycardia.  He is always very active he walks a lot however lately started having some problems he is toe and that slows him down.  Denies having any chest pain tightness squeezing pressure burning chest he said he does get short of breath when he gets up he will this is stable phenomenon.  No swelling of lower extremities no palpitations no dizziness no passing out.  Past Medical History:  Diagnosis Date  . Basal cell carcinoma   . Benign prostatic hypertrophy   . Dyslipidemia   . Gallbladder problem   . Gastroesophageal reflux disease   . Hiatal hernia   . Mild memory disturbance   . TIA (transient ischemic attack)     Past Surgical History:  Procedure Laterality Date  . none      Current Medications: Current Meds  Medication Sig  . aspirin 81 MG tablet Take 81 mg by mouth daily.   Marland Kitchen atorvastatin (LIPITOR) 20 MG tablet Take 20 mg by mouth daily.  . cholecalciferol (VITAMIN D) 1000 UNITS tablet Take 1,000 Units by mouth daily.  Marland Kitchen donepezil (ARICEPT) 10 MG tablet TAKE 1 TABLET BY MOUTH  DAILY  . Fexofenadine-Pseudoephedrine (ALLEGRA-D 24 HOUR PO) Take 1 tablet by mouth daily.  . finasteride (PROSCAR) 5 MG tablet Take 5 mg by mouth daily.  Marland Kitchen ipratropium (ATROVENT) 0.06 % nasal spray Place 2 sprays into both nostrils daily as needed for rhinitis.   . Melatonin 10 MG TABS Take 10 mg by mouth at bedtime.  . Multiple Vitamins-Minerals  (MULTIVITAMIN ADULTS 50+) TABS Take 1 tablet by mouth daily.   Marland Kitchen omeprazole (PRILOSEC) 40 MG capsule Take 40 mg by mouth daily.   Marland Kitchen thiamine 100 MG tablet Take 1 tablet (100 mg total) by mouth daily.  . traZODone (DESYREL) 100 MG tablet TAKE 2 TABLETS BY MOUTH AT  BEDTIME     Allergies:   Patient has no known allergies.   Social History   Socioeconomic History  . Marital status: Married    Spouse name: pamela  . Number of children: 2  . Years of education: college  . Highest education level: Not on file  Occupational History  . Occupation: retired  Tobacco Use  . Smoking status: Former Smoker    Quit date: 05/04/1979    Years since quitting: 40.6  . Smokeless tobacco: Never Used  Substance and Sexual Activity  . Alcohol use: Yes    Alcohol/week: 15.0 - 20.0 standard drinks    Types: 15 - 20 Cans of beer per week  . Drug use: Never  . Sexual activity: Not on file  Other Topics Concern  . Not on file  Social History Narrative   Pt is retired. He is married. The pt lives with his wife. He has a Best boy. The pt has 2 children.      Patient drinks 2-3 cups of caffeine daily.   Patient is  right handed.    Social Determinants of Health   Financial Resource Strain:   . Difficulty of Paying Living Expenses:   Food Insecurity:   . Worried About Charity fundraiser in the Last Year:   . Arboriculturist in the Last Year:   Transportation Needs:   . Film/video editor (Medical):   Marland Kitchen Lack of Transportation (Non-Medical):   Physical Activity:   . Days of Exercise per Week:   . Minutes of Exercise per Session:   Stress:   . Feeling of Stress :   Social Connections:   . Frequency of Communication with Friends and Family:   . Frequency of Social Gatherings with Friends and Family:   . Attends Religious Services:   . Active Member of Clubs or Organizations:   . Attends Archivist Meetings:   Marland Kitchen Marital Status:      Family History: The patient's family  history includes Memory loss in his mother; Obesity in his mother. ROS:   Please see the history of present illness.    All 14 point review of systems negative except as described per history of present illness  EKGs/Labs/Other Studies Reviewed:      Recent Labs: 12/12/2018: ALT 30; BUN 16; Creatinine, Ser 1.10; Hemoglobin 13.7; Magnesium 2.3; Platelets 202; Potassium 3.9; Sodium 138; TSH 1.890  Recent Lipid Panel No results found for: CHOL, TRIG, HDL, CHOLHDL, VLDL, LDLCALC, LDLDIRECT  Physical Exam:    VS:  BP 120/70   Pulse 64   Ht 5\' 7"  (1.702 m)   Wt 158 lb (71.7 kg)   SpO2 94%   BMI 24.75 kg/m     Wt Readings from Last 3 Encounters:  12/06/19 158 lb (71.7 kg)  05/31/19 157 lb (71.2 kg)  05/30/19 159 lb 3.2 oz (72.2 kg)     GEN:  Well nourished, well developed in no acute distress HEENT: Normal NECK: No JVD; No carotid bruits LYMPHATICS: No lymphadenopathy CARDIAC: RRR, no murmurs, no rubs, no gallops RESPIRATORY:  Clear to auscultation without rales, wheezing or rhonchi  ABDOMEN: Soft, non-tender, non-distended MUSCULOSKELETAL:  No edema; No deformity  SKIN: Warm and dry LOWER EXTREMITIES: no swelling NEUROLOGIC:  Alert and oriented x 3 PSYCHIATRIC:  Normal affect   ASSESSMENT:    1. Dyspnea on exertion   2. Dyslipidemia   3. Syncope and collapse   4. Memory loss   5. Bradycardia    PLAN:    In order of problems listed above:  1. Dyspnea on exertion.  Echocardiogram will be repeated.  I will be looking at pulmonary hypertension. 2. Dyslipidemia I did review his K PN which showed his LDL of 72 that was done in August of last year, HDL was 54.  We will continue present management.  He is scheduled to see his primary care physician within next 2 months he will have another fasting lipid profile done. 3. Syncope and collapse: Denies having any. 4. Memory also lost.  Noted.  He is on Aricept which I will continue still able to function quite  well 5. Bradycardia.  Denies having dizziness or passing out.  We will continue present management   Medication Adjustments/Labs and Tests Ordered: Current medicines are reviewed at length with the patient today.  Concerns regarding medicines are outlined above.  No orders of the defined types were placed in this encounter.  Medication changes: No orders of the defined types were placed in this encounter.   Signed, Marily Lente.  Agustin Cree, MD, [facility] 12/06/2019 10:12 AM    [place]

## 2019-12-06 NOTE — Patient Instructions (Signed)
Medication Instructions:  Your physician recommends that you continue on your current medications as directed. Please refer to the Current Medication list given to you today.  *If you need a refill on your cardiac medications before your next appointment, please call your pharmacy*   Lab Work: None ordered   If you have labs (blood work) drawn today and your tests are completely normal, you will receive your results only by: MyChart Message (if you have MyChart) OR A paper copy in the mail If you have any lab test that is abnormal or we need to change your treatment, we will call you to review the results.   Testing/Procedures: Your physician has requested that you have an echocardiogram. Echocardiography is a painless test that uses sound waves to create images of your heart. It provides your doctor with information about the size and shape of your heart and how well your heart's chambers and valves are working. This procedure takes approximately one hour. There are no restrictions for this procedure.    Follow-Up: At [facility], you and your health needs are our priority.  As part of our continuing mission to provide you with exceptional heart care, we have created designated Provider Care Teams.  These Care Teams include your primary Cardiologist (physician) and Advanced Practice Providers (APPs -  Physician Assistants and Nurse Practitioners) who all work together to provide you with the care you need, when you need it.  We recommend signing up for the patient portal called "MyChart".  Sign up information is provided on this After Visit Summary.  MyChart is used to connect with patients for Virtual Visits (Telemedicine).  Patients are able to view lab/test results, encounter notes, upcoming appointments, etc.  Non-urgent messages can be sent to your provider as well.   To learn more about what you can do with MyChart, go to [email or web address].    Your next appointment:   5  month(s)  The format for your next appointment:   In Person  Provider:   Robert Krasowski, MD   Other Instructions None   

## 2019-12-13 ENCOUNTER — Other Ambulatory Visit: Payer: Self-pay

## 2019-12-13 ENCOUNTER — Telehealth: Payer: Self-pay

## 2019-12-13 ENCOUNTER — Ambulatory Visit (HOSPITAL_BASED_OUTPATIENT_CLINIC_OR_DEPARTMENT_OTHER)
Admission: RE | Admit: 2019-12-13 | Discharge: 2019-12-13 | Disposition: A | Discharge status: Home / Self Care | Payer: Medicare Other | Source: Ambulatory Visit | Attending: Cardiology | Admitting: Cardiology

## 2019-12-13 DIAGNOSIS — I272 Pulmonary hypertension, unspecified: Secondary | ICD-10-CM

## 2019-12-13 NOTE — Progress Notes (Signed)
  Echocardiogram 2D Echocardiogram has been performed.  Frank Meyer 12/13/2019, 11:36 AM

## 2019-12-13 NOTE — Telephone Encounter (Signed)
Left message on patients voicemail to please return our call.   

## 2019-12-13 NOTE — Telephone Encounter (Signed)
Spoke with patient regarding results and recommendation.  Patient verbalizes understanding and is agreeable to plan of care. Advised patient to call back with any issues or concerns.  

## 2019-12-13 NOTE — Telephone Encounter (Signed)
-----   Message from Park Liter, MD sent at 12/13/2019  3:33 PM EDT ----- Echocardiogram showed normal left ventricle ejection fraction, mildly enlarged ascending aorta mild mitral valve regurgitation, pulmonary artery pressure mildly elevated 45 mmHg, will discuss this during next visit

## 2019-12-18 DIAGNOSIS — H43811 Vitreous degeneration, right eye: Secondary | ICD-10-CM | POA: Insufficient documentation

## 2019-12-18 DIAGNOSIS — H2511 Age-related nuclear cataract, right eye: Secondary | ICD-10-CM

## 2019-12-18 DIAGNOSIS — Z961 Presence of intraocular lens: Secondary | ICD-10-CM | POA: Insufficient documentation

## 2019-12-18 DIAGNOSIS — H353221 Exudative age-related macular degeneration, left eye, with active choroidal neovascularization: Secondary | ICD-10-CM | POA: Insufficient documentation

## 2019-12-18 DIAGNOSIS — H353290 Exudative age-related macular degeneration, unspecified eye, stage unspecified: Secondary | ICD-10-CM | POA: Insufficient documentation

## 2019-12-18 HISTORY — DX: Exudative age-related macular degeneration, left eye, with active choroidal neovascularization: H35.3221

## 2019-12-18 HISTORY — DX: Age-related nuclear cataract, right eye: H25.11

## 2019-12-18 HISTORY — DX: Presence of intraocular lens: Z96.1

## 2019-12-18 HISTORY — DX: Vitreous degeneration, right eye: H43.811

## 2019-12-19 ENCOUNTER — Ambulatory Visit (INDEPENDENT_AMBULATORY_CARE_PROVIDER_SITE_OTHER): Payer: Medicare Other | Admitting: Ophthalmology

## 2019-12-19 ENCOUNTER — Other Ambulatory Visit: Payer: Self-pay

## 2019-12-19 ENCOUNTER — Encounter (INDEPENDENT_AMBULATORY_CARE_PROVIDER_SITE_OTHER): Payer: Self-pay | Admitting: Ophthalmology

## 2019-12-19 DIAGNOSIS — H353221 Exudative age-related macular degeneration, left eye, with active choroidal neovascularization: Secondary | ICD-10-CM | POA: Diagnosis not present

## 2019-12-19 DIAGNOSIS — Z961 Presence of intraocular lens: Secondary | ICD-10-CM

## 2019-12-19 DIAGNOSIS — H43811 Vitreous degeneration, right eye: Secondary | ICD-10-CM | POA: Diagnosis not present

## 2019-12-19 DIAGNOSIS — H2511 Age-related nuclear cataract, right eye: Secondary | ICD-10-CM | POA: Diagnosis not present

## 2019-12-19 MED ORDER — BEVACIZUMAB CHEMO INJECTION 1.25MG/0.05ML SYRINGE FOR KALEIDOSCOPE
1.2500 mg | INTRAVITREAL | Status: AC | PRN
  Administered 2019-12-19: 11:00:00 1.25 mg via INTRAVITREAL

## 2019-12-19 NOTE — Progress Notes (Signed)
12/19/2019     CHIEF COMPLAINT Patient presents for Retina Follow Up   HISTORY OF PRESENT ILLNESS: Frank Meyer is a 79 y.o. 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  Duration of 7 weeks.  Since onset it is gradually worsening.          Comments    7 week follow up- OCT OU, Possible Avastin OS Patient states he saw a lot of floaters after his previous injection for longer than usual and states that fine print is becoming harder to read.       Last edited by Gerda Diss on 12/19/2019 10:07 AM. (History)      Referring physician: Burnard Bunting, MD [address] [place],  [place] [address]  HISTORICAL INFORMATION:   Selected notes from the [facility]: No current outpatient medications on file. (Ophthalmic Drugs)   No current facility-administered medications for this visit. (Ophthalmic Drugs)   Current Outpatient Medications (Other)  Medication Sig  . aspirin 81 MG tablet Take 81 mg by mouth daily.   Marland Kitchen atorvastatin (LIPITOR) 20 MG tablet Take 20 mg by mouth daily.  . cholecalciferol (VITAMIN D) 1000 UNITS tablet Take 1,000 Units by mouth daily.  Marland Kitchen donepezil (ARICEPT) 10 MG tablet TAKE 1 TABLET BY MOUTH  DAILY  . Fexofenadine-Pseudoephedrine (ALLEGRA-D 24 HOUR PO) Take 1 tablet by mouth daily.  . finasteride (PROSCAR) 5 MG tablet Take 5 mg by mouth daily.  Marland Kitchen ipratropium (ATROVENT) 0.06 % nasal spray Place 2 sprays into both nostrils daily as needed for rhinitis.   . Melatonin 10 MG TABS Take 10 mg by mouth at bedtime.  . Multiple Vitamins-Minerals (MULTIVITAMIN ADULTS 50+) TABS Take 1 tablet by mouth daily.   Marland Kitchen omeprazole (PRILOSEC) 40 MG capsule Take 40 mg by mouth daily.   Marland Kitchen thiamine 100 MG tablet Take 1 tablet (100 mg total) by mouth daily.  . traZODone (DESYREL) 100 MG tablet TAKE 2 TABLETS BY MOUTH AT  BEDTIME   No current  facility-administered medications for this visit. (Other)      REVIEW OF SYSTEMS:    ALLERGIES No Known Allergies  PAST MEDICAL HISTORY Past Medical History:  Diagnosis Date  . Basal cell carcinoma   . Benign prostatic hypertrophy   . Dyslipidemia   . Gallbladder problem   . Gastroesophageal reflux disease   . Hiatal hernia   . Mild memory disturbance   . TIA (transient ischemic attack)    Past Surgical History:  Procedure Laterality Date  . none      FAMILY HISTORY Family History  Problem Relation Age of Onset  . Obesity Mother   . Memory loss Mother     SOCIAL HISTORY Social History   Tobacco Use  . Smoking status: Former Smoker    Quit date: 05/04/1979    Years since quitting: 40.6  . Smokeless tobacco: Never Used  Substance Use Topics  . Alcohol use: Yes    Alcohol/week: 15.0 - 20.0 standard drinks    Types: 15 - 20 Cans of beer per week  . Drug use: Never         OPHTHALMIC EXAM:  Base Eye Exam    Visual Acuity (Snellen - Linear)      Right Left   Dist [place] 20/25-2 20/30+1   Dist ph  [place]  NI       Tonometry (Tonopen, 10:13 AM)      Right Left   Pressure 14 13       Pupils      Pupils Dark Light Shape React APD   Right PERRL 3 2 Round Brisk None   Left PERRL 3 2 Round Brisk None       Visual Fields (Counting fingers)      Left Right    Full Full       Extraocular Movement      Right Left    Full Full       Neuro/Psych    Oriented x3: Yes   Mood/Affect: Normal       Dilation    Left eye: 1.0% Mydriacyl, 2.5% Phenylephrine @ 10:13 AM        Slit Lamp and Fundus Exam    External Exam      Right Left   External Normal Normal       Slit Lamp Exam      Right Left   Lids/Lashes Normal Normal   Conjunctiva/Sclera White and quiet White and quiet   Cornea Clear Clear   Anterior Chamber Deep and quiet Deep and quiet   Iris Round and reactive Round and reactive   Lens 2+ Nuclear sclerosis Posterior chamber intraocular lens     Anterior Vitreous Normal Normal       Fundus Exam      Right Left   Posterior Vitreous  Normal   Disc  Normal   C/D Ratio  0.1   Macula  Retinal pigment epithelial mottling, Early age related macular degeneration, no hemorrhage, Hard drusen   Vessels  Normal   Periphery  Normal          IMAGING AND PROCEDURES  Imaging and Procedures for 12/19/19  OCT, Retina - OU - Both Eyes       Right Eye Quality was good. Scan locations included subfoveal. Central Foveal Thickness: 302. Progression has been stable. Findings include retinal drusen .   Left Eye Quality was good. Scan locations included subfoveal. Central Foveal Thickness: 307. Progression has improved. Findings include abnormal foveal contour, retinal drusen , subretinal hyper-reflective material.   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  Improved anatomy and acuity on intravitreal Avastin, day at 7-week interval       Intravitreal Injection, Pharmacologic Agent - OS - Left Eye       Time Out 12/19/2019. 11:28 AM. Confirmed correct patient, procedure, site, and patient consented.   Anesthesia Topical anesthesia was used. Anesthetic medications included Akten 3.5%.   Procedure Preparation included Ofloxacin , 10% betadine to eyelids. A 30 gauge needle was used.   Injection:  1.25 mg Bevacizumab (AVASTIN) SOLN   NDC: [id number]   Route: Intravitreal, Site: Left Eye, Waste: 0 mg  Post-op Post injection exam found visual acuity of at least counting fingers. The patient tolerated the procedure well. There were no complications. The patient received written and verbal post procedure care education. Post injection medications were not given.                 ASSESSMENT/PLAN:  Exudative age-related macular degeneration of left eye with active choroidal neovascularization (HCC) The nature of wet  macular degeneration was discussed with the patient.  Forms of therapy reviewed include the use of Anti-VEGF medications injected painlessly into the  eye, as well as other possible treatment modalities, including thermal laser therapy. Fellow eye involvement and risks were discussed with the patient. Upon the finding of wet age related macular degeneration, treatment will be offered. The treatment regimen is on a treat as needed basis with the intent to treat if necessary and extend interval of exams when possible. On average 1 out of 6 patients do not need lifetime therapy. However, the risk of recurrent disease is high for a lifetime.  Initially monthly, then periodic, examinations and evaluations will determine whether the next treatment is required on the day of the examination. OS much improved since onset of therapy October 2020      ICD-10-CM   1. Exudative age-related macular degeneration of left eye with active choroidal neovascularization (HCC)  H35.3221 OCT, Retina - OU - Both Eyes    Intravitreal Injection, Pharmacologic Agent - OS - Left Eye    Bevacizumab (AVASTIN) SOLN 1.25 mg  2. Nuclear sclerotic cataract of right eye  H25.11   3. Posterior vitreous detachment of right eye  H43.811   4. Pseudophakia of left eye  Z96.1     1.  Repeat intravitreal Avastin OS today at 7-week interval.  Will reexamine left eye in 9 weeks  2.  3.  Ophthalmic Meds Ordered this visit:  Meds ordered this encounter  Medications  . Bevacizumab (AVASTIN) SOLN 1.25 mg       Return in about 9 weeks (around 02/20/2020) for AVASTIN OCT, OS.  There are no Patient Instructions on file for this visit.   Explained the diagnoses, plan, and follow up with the patient and they expressed understanding.  Patient expressed understanding of the importance of proper follow up care.   Clent Demark Annesha Delgreco M.D. Diseases & Surgery of the Retina and Vitreous Retina & Diabetic [place] 12/19/19     Abbreviations: M myopia (nearsighted); A astigmatism; H hyperopia (farsighted); P presbyopia; Mrx spectacle prescription;  CTL contact lenses; OD right eye; OS left eye; OU both eyes  XT exotropia; ET esotropia; PEK punctate epithelial keratitis; PEE punctate epithelial erosions; DES dry eye syndrome; MGD meibomian gland dysfunction; ATs artificial tears; PFAT's preservative free artificial tears; [place] nuclear sclerotic cataract; PSC posterior subcapsular cataract; ERM epi-retinal membrane; PVD posterior vitreous detachment; RD retinal detachment; DM diabetes mellitus; DR diabetic retinopathy; NPDR non-proliferative diabetic retinopathy; PDR proliferative diabetic retinopathy; CSME clinically significant macular edema; DME diabetic macular edema; dbh dot blot hemorrhages; CWS cotton wool spot; POAG primary open angle glaucoma; C/D cup-to-disc ratio; HVF humphrey visual field; GVF goldmann visual field; OCT optical coherence tomography; IOP intraocular pressure; BRVO Branch retinal vein occlusion; CRVO central retinal vein occlusion; CRAO central retinal artery occlusion; BRAO branch retinal artery occlusion; RT retinal tear; SB scleral buckle; PPV pars plana vitrectomy; VH Vitreous hemorrhage; PRP panretinal laser photocoagulation; IVK intravitreal kenalog; VMT vitreomacular traction; MH Macular hole;  NVD neovascularization of the disc; NVE neovascularization elsewhere; AREDS age related eye disease study; ARMD age related macular degeneration; POAG primary open angle glaucoma; EBMD epithelial/anterior basement membrane dystrophy; ACIOL anterior chamber intraocular lens; IOL intraocular lens; PCIOL posterior chamber intraocular lens; Phaco/IOL phacoemulsification with intraocular lens placement; [place] photorefractive keratectomy; LASIK laser assisted in situ keratomileusis; HTN hypertension; DM diabetes mellitus; COPD chronic obstructive pulmonary disease

## 2019-12-19 NOTE — Assessment & Plan Note (Signed)
The nature of wet macular degeneration was discussed with the patient.  Forms of therapy reviewed include the use of Anti-VEGF medications injected painlessly into the eye, as well as other possible treatment modalities, including thermal laser therapy. Fellow eye involvement and risks were discussed with the patient. Upon the finding of wet age related macular degeneration, treatment will be offered. The treatment regimen is on a treat as needed basis with the intent to treat if necessary and extend interval of exams when possible. On average 1 out of 6 patients do not need lifetime therapy. However, the risk of recurrent disease is high for a lifetime.  Initially monthly, then periodic, examinations and evaluations will determine whether the next treatment is required on the day of the examination. OS much improved since onset of therapy October 2020

## 2020-02-06 ENCOUNTER — Encounter (INDEPENDENT_AMBULATORY_CARE_PROVIDER_SITE_OTHER): Payer: Medicare Other | Admitting: Ophthalmology

## 2020-02-20 ENCOUNTER — Ambulatory Visit (INDEPENDENT_AMBULATORY_CARE_PROVIDER_SITE_OTHER): Payer: Medicare Other | Admitting: Ophthalmology

## 2020-02-20 ENCOUNTER — Other Ambulatory Visit: Payer: Self-pay

## 2020-02-20 ENCOUNTER — Encounter (INDEPENDENT_AMBULATORY_CARE_PROVIDER_SITE_OTHER): Payer: Self-pay | Admitting: Ophthalmology

## 2020-02-20 DIAGNOSIS — H353221 Exudative age-related macular degeneration, left eye, with active choroidal neovascularization: Secondary | ICD-10-CM | POA: Diagnosis not present

## 2020-02-20 MED ORDER — BEVACIZUMAB CHEMO INJECTION 1.25MG/0.05ML SYRINGE FOR KALEIDOSCOPE
1.2500 mg | INTRAVITREAL | Status: AC | PRN
  Administered 2020-02-20: 1.25 mg via INTRAVITREAL

## 2020-02-20 NOTE — Assessment & Plan Note (Signed)
The nature of wet macular degeneration was discussed with the patient.  Forms of therapy reviewed include the use of Anti-VEGF medications injected painlessly into the eye, as well as other possible treatment modalities, including thermal laser therapy. Fellow eye involvement and risks were discussed with the patient. Upon the finding of wet age related macular degeneration, treatment will be offered. The treatment regimen is on a treat as needed basis with the intent to treat if necessary and extend interval of exams when possible. On average 1 out of 6 patients do not need lifetime therapy. However, the risk of recurrent disease is high for a lifetime.  Initially monthly, then periodic, examinations and evaluations will determine whether the next treatment is required on the day of the examination.  Vastly improved as compared to onset October 2020

## 2020-02-20 NOTE — Progress Notes (Signed)
02/20/2020     CHIEF COMPLAINT Patient presents for Retina Follow Up   HISTORY OF PRESENT ILLNESS: Frank Meyer is a 79 y.o. 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  This started 9 weeks ago.  Severity is mild.  Duration of 9 weeks.  Since onset it is stable.          Comments    9 Week AMD F/U OS, poss Avastin OS  Pt denies ocular pain, flashes of light, or floaters OU. Pt sts he may be having difficulty seeing small print like the newspaper OU.       Last edited by Rockie Neighbours, [place] on 02/20/2020 10:30 AM. (History)      Referring physician: Burnard Bunting, MD [place],  [place] [id number]  HISTORICAL INFORMATION:   Selected notes from the [facility]: No current outpatient medications on file. (Ophthalmic Drugs)   No current facility-administered medications for this visit. (Ophthalmic Drugs)   Current Outpatient Medications (Other)  Medication Sig  . aspirin 81 MG tablet Take 81 mg by mouth daily.   Marland Kitchen atorvastatin (LIPITOR) 20 MG tablet Take 20 mg by mouth daily.  . cholecalciferol (VITAMIN D) 1000 UNITS tablet Take 1,000 Units by mouth daily.  Marland Kitchen donepezil (ARICEPT) 10 MG tablet TAKE 1 TABLET BY MOUTH  DAILY  . Fexofenadine-Pseudoephedrine (ALLEGRA-D 24 HOUR PO) Take 1 tablet by mouth daily.  . finasteride (PROSCAR) 5 MG tablet Take 5 mg by mouth daily.  Marland Kitchen ipratropium (ATROVENT) 0.06 % nasal spray Place 2 sprays into both nostrils daily as needed for rhinitis.   . Melatonin 10 MG TABS Take 10 mg by mouth at bedtime.  . Multiple Vitamins-Minerals (MULTIVITAMIN ADULTS 50+) TABS Take 1 tablet by mouth daily.   Marland Kitchen omeprazole (PRILOSEC) 40 MG capsule Take 40 mg by mouth daily.   Marland Kitchen thiamine 100 MG tablet Take 1 tablet (100 mg total) by mouth daily.  . traZODone (DESYREL) 100 MG tablet TAKE 2 TABLETS BY MOUTH AT  BEDTIME   No current  facility-administered medications for this visit. (Other)      REVIEW OF SYSTEMS:    ALLERGIES No Known Allergies  PAST MEDICAL HISTORY Past Medical History:  Diagnosis Date  . Basal cell carcinoma   . Benign prostatic hypertrophy   . Dyslipidemia   . Gallbladder problem   . Gastroesophageal reflux disease   . Hiatal hernia   . Mild memory disturbance   . TIA (transient ischemic attack)    Past Surgical History:  Procedure Laterality Date  . none      FAMILY HISTORY Family History  Problem Relation Age of Onset  . Obesity Mother   . Memory loss Mother     SOCIAL HISTORY Social History   Tobacco Use  . Smoking status: Former Smoker    Quit date: 05/04/1979    Years since quitting: 40.8  . Smokeless tobacco: Never Used  Substance Use Topics  . Alcohol use: Yes    Alcohol/week: 15.0 - 20.0 standard drinks    Types: 15 - 20 Cans of beer per week  . Drug use: Never         OPHTHALMIC EXAM:  Base Eye Exam    Visual Acuity (ETDRS)      Right Left   Dist   20/30 20/25 -1   Dist ph [place] 20/25 +1        Tonometry (Tonopen, 10:33 AM)      Right Left   Pressure 15 14       Pupils      Pupils Dark Light Shape React APD   Right PERRL 3 2 Round Brisk None   Left PERRL 3 2 Round Brisk None       Visual Fields (Counting fingers)      Left Right    Full Full       Extraocular Movement      Right Left    Full Full       Neuro/Psych    Oriented x3: Yes   Mood/Affect: Normal       Dilation    Left eye: 1.0% Mydriacyl, 2.5% Phenylephrine @ 10:33 AM        Slit Lamp and Fundus Exam    External Exam      Right Left   External Normal Normal       Slit Lamp Exam      Right Left   Lids/Lashes Normal Normal   Conjunctiva/Sclera White and quiet White and quiet   Cornea Clear Clear   Anterior Chamber Deep and quiet Deep and quiet   Iris Round and reactive Round and reactive   Lens 2+ Nuclear sclerosis Posterior chamber intraocular lens    Anterior Vitreous Normal Normal       Fundus Exam      Right Left   Posterior Vitreous  Normal   Disc  Normal   C/D Ratio  0.1   Macula  Retinal pigment epithelial mottling, Early age related macular degeneration, no hemorrhage, Hard drusen   Vessels  Normal   Periphery  Normal          IMAGING AND PROCEDURES  Imaging and Procedures for 02/20/20  OCT, Retina - OU - Both Eyes       Right Eye Quality was good. Scan locations included subfoveal. Central Foveal Thickness: 303. Progression has been stable. Findings include epiretinal membrane.   Left Eye Quality was good. Scan locations included subfoveal. Progression has improved. Findings include retinal drusen , outer retinal atrophy.   Notes OS, vastly improved anatomy as compared to October 2020 on intravitreal Avastin currently on 9-week interval.       Intravitreal Injection, Pharmacologic Agent - OS - Left Eye       Time Out 02/20/2020. 11:31 AM. Confirmed correct patient, procedure, site, and patient consented.   Anesthesia Anesthetic medications included Akten 3.5%.   Procedure Preparation included 10% betadine to eyelids, Tobramycin 0.3%. A 30 gauge needle was used.   Injection:  1.25 mg Bevacizumab (AVASTIN) SOLN   NDC: [address]-0   Route: Intravitreal, Site: Left Eye, Waste: 0 mg  Post-op Post injection exam found visual acuity of at least counting fingers. The patient tolerated the procedure well. There were no complications. The patient received written and verbal post procedure care education. Post injection medications were not given.                 ASSESSMENT/PLAN:  Exudative age-related macular degeneration of left eye with active choroidal neovascularization (HCC) The nature of wet macular degeneration was discussed with the patient.  Forms of therapy reviewed include the use of Anti-VEGF medications injected painlessly into the eye, as well as other possible treatment modalities,  including thermal laser therapy. Fellow eye involvement and risks were discussed with the patient.  Upon the finding of wet age related macular degeneration, treatment will be offered. The treatment regimen is on a treat as needed basis with the intent to treat if necessary and extend interval of exams when possible. On average 1 out of 6 patients do not need lifetime therapy. However, the risk of recurrent disease is high for a lifetime.  Initially monthly, then periodic, examinations and evaluations will determine whether the next treatment is required on the day of the examination.  Vastly improved as compared to onset October 2020   OS, vastly improved anatomy as compared to October 2020 on intravitreal Avastin currently on 9-week interval.    ICD-10-CM   1. Exudative age-related macular degeneration of left eye with active choroidal neovascularization (HCC)  H35.3221 OCT, Retina - OU - Both Eyes    Intravitreal Injection, Pharmacologic Agent - OS - Left Eye    Bevacizumab (AVASTIN) SOLN 1.25 mg    1.  Repeat intravitreal Avastin OS today and examination in 10 weeks  2.  3.  Ophthalmic Meds Ordered this visit:  Meds ordered this encounter  Medications  . Bevacizumab (AVASTIN) SOLN 1.25 mg       Return in about 10 weeks (around 04/30/2020) for dilate, OS, AVASTIN OCT.  There are no Patient Instructions on file for this visit.   Explained the diagnoses, plan, and follow up with the patient and they expressed understanding.  Patient expressed understanding of the importance of proper follow up care.   Clent Demark Ivanell Deshotel M.D. Diseases & Surgery of the Retina and Vitreous Retina & Diabetic [place] 02/20/20     Abbreviations: M myopia (nearsighted); A astigmatism; H hyperopia (farsighted); P presbyopia; Mrx spectacle prescription;  CTL contact lenses; OD right eye; OS left eye; OU both eyes  XT exotropia; ET esotropia; PEK punctate epithelial keratitis; PEE punctate epithelial  erosions; DES dry eye syndrome; MGD meibomian gland dysfunction; ATs artificial tears; PFAT's preservative free artificial tears; [place] nuclear sclerotic cataract; PSC posterior subcapsular cataract; ERM epi-retinal membrane; PVD posterior vitreous detachment; RD retinal detachment; DM diabetes mellitus; DR diabetic retinopathy; NPDR non-proliferative diabetic retinopathy; PDR proliferative diabetic retinopathy; CSME clinically significant macular edema; DME diabetic macular edema; dbh dot blot hemorrhages; CWS cotton wool spot; POAG primary open angle glaucoma; C/D cup-to-disc ratio; HVF humphrey visual field; GVF goldmann visual field; OCT optical coherence tomography; IOP intraocular pressure; BRVO Branch retinal vein occlusion; CRVO central retinal vein occlusion; CRAO central retinal artery occlusion; BRAO branch retinal artery occlusion; RT retinal tear; SB scleral buckle; PPV pars plana vitrectomy; VH Vitreous hemorrhage; PRP panretinal laser photocoagulation; IVK intravitreal kenalog; VMT vitreomacular traction; MH Macular hole;  NVD neovascularization of the disc; NVE neovascularization elsewhere; AREDS age related eye disease study; ARMD age related macular degeneration; POAG primary open angle glaucoma; EBMD epithelial/anterior basement membrane dystrophy; ACIOL anterior chamber intraocular lens; IOL intraocular lens; PCIOL posterior chamber intraocular lens; Phaco/IOL phacoemulsification with intraocular lens placement; [place] photorefractive keratectomy; LASIK laser assisted in situ keratomileusis; HTN hypertension; DM diabetes mellitus; COPD chronic obstructive pulmonary disease

## 2020-03-21 ENCOUNTER — Other Ambulatory Visit: Payer: Self-pay | Admitting: Neurology

## 2020-04-30 ENCOUNTER — Other Ambulatory Visit: Payer: Self-pay

## 2020-04-30 ENCOUNTER — Ambulatory Visit (INDEPENDENT_AMBULATORY_CARE_PROVIDER_SITE_OTHER): Payer: Medicare Other | Admitting: Ophthalmology

## 2020-04-30 ENCOUNTER — Encounter (INDEPENDENT_AMBULATORY_CARE_PROVIDER_SITE_OTHER): Payer: Self-pay | Admitting: Ophthalmology

## 2020-04-30 DIAGNOSIS — H353221 Exudative age-related macular degeneration, left eye, with active choroidal neovascularization: Secondary | ICD-10-CM

## 2020-04-30 DIAGNOSIS — H35371 Puckering of macula, right eye: Secondary | ICD-10-CM

## 2020-04-30 HISTORY — DX: Puckering of macula, right eye: H35.371

## 2020-04-30 MED ORDER — BEVACIZUMAB CHEMO INJECTION 1.25MG/0.05ML SYRINGE FOR KALEIDOSCOPE
1.2500 mg | INTRAVITREAL | Status: AC | PRN
  Administered 2020-04-30: 1.25 mg via INTRAVITREAL

## 2020-04-30 NOTE — Assessment & Plan Note (Signed)
Minor no topographic distortion 

## 2020-04-30 NOTE — Assessment & Plan Note (Signed)
OS vastly improved over the last 1 year, stable today at 10 weeks post intravitreal Avastin, will repeat injection today and examination in 12 weeks

## 2020-04-30 NOTE — Progress Notes (Signed)
04/30/2020     CHIEF COMPLAINT Patient presents for Retina Follow Up   HISTORY OF PRESENT ILLNESS: Frank Meyer is a 79 y.o. 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  This started 10 weeks ago.  Severity is mild.  Duration of 10 weeks.  Since onset it is stable.          Comments    10 Week AMD F/U OS, poss Avastin OS  Pt denies noticeable changes to [place] OU since last visit. Pt denies ocular pain, flashes of light, or floaters OU.         Last edited by Rockie Neighbours, [place] on 04/30/2020 10:43 AM. (History)      Referring physician: Burnard Bunting, MD [place],  [place] [address]  HISTORICAL INFORMATION:   Selected notes from the [place]: No current outpatient medications on file. (Ophthalmic Drugs)   No current facility-administered medications for this visit. (Ophthalmic Drugs)   Current Outpatient Medications (Other)  Medication Sig  . aspirin 81 MG tablet Take 81 mg by mouth daily.   Marland Kitchen atorvastatin (LIPITOR) 20 MG tablet Take 20 mg by mouth daily.  . cholecalciferol (VITAMIN D) 1000 UNITS tablet Take 1,000 Units by mouth daily.  Marland Kitchen donepezil (ARICEPT) 10 MG tablet TAKE 1 TABLET BY MOUTH  DAILY  . Fexofenadine-Pseudoephedrine (ALLEGRA-D 24 HOUR PO) Take 1 tablet by mouth daily.  . finasteride (PROSCAR) 5 MG tablet Take 5 mg by mouth daily.  Marland Kitchen ipratropium (ATROVENT) 0.06 % nasal spray Place 2 sprays into both nostrils daily as needed for rhinitis.   . Melatonin 10 MG TABS Take 10 mg by mouth at bedtime.  . Multiple Vitamins-Minerals (MULTIVITAMIN ADULTS 50+) TABS Take 1 tablet by mouth daily.   Marland Kitchen omeprazole (PRILOSEC) 40 MG capsule Take 40 mg by mouth daily.   Marland Kitchen thiamine 100 MG tablet Take 1 tablet (100 mg total) by mouth daily.  . traZODone (DESYREL) 100 MG tablet TAKE 2 TABLETS BY MOUTH AT  BEDTIME   No current  facility-administered medications for this visit. (Other)      REVIEW OF SYSTEMS:    ALLERGIES No Known Allergies  PAST MEDICAL HISTORY Past Medical History:  Diagnosis Date  . Basal cell carcinoma   . Benign prostatic hypertrophy   . Dyslipidemia   . Gallbladder problem   . Gastroesophageal reflux disease   . Hiatal hernia   . Mild memory disturbance   . TIA (transient ischemic attack)    Past Surgical History:  Procedure Laterality Date  . none      FAMILY HISTORY Family History  Problem Relation Age of Onset  . Obesity Mother   . Memory loss Mother     SOCIAL HISTORY Social History   Tobacco Use  . Smoking status: Former Smoker    Quit date: 05/04/1979    Years since quitting: 41.0  . Smokeless tobacco: Never Used  Substance Use Topics  . Alcohol use: Yes    Alcohol/week: 15.0 - 20.0 standard drinks    Types: 15 - 20 Cans of beer per week  . Drug use: Never         OPHTHALMIC EXAM:  Base Eye Exam    Visual Acuity (ETDRS)      Right Left   Dist  20/20 -1  20/20       Tonometry (Tonopen, 10:44 AM)      Right Left   Pressure 13 10       Pupils      Pupils Dark Light Shape React APD   Right PERRL 3 2 Round Brisk None   Left PERRL 3 2 Round Brisk None       Visual Fields (Counting fingers)      Left Right    Full Full       Extraocular Movement      Right Left    Full Full       Neuro/Psych    Oriented x3: Yes   Mood/Affect: Normal       Dilation    Left eye: 1.0% Mydriacyl, 2.5% Phenylephrine @ 10:47 AM        Slit Lamp and Fundus Exam    External Exam      Right Left   External Normal Normal       Slit Lamp Exam      Right Left   Lids/Lashes Normal Normal   Conjunctiva/Sclera White and quiet White and quiet   Cornea Clear Clear   Anterior Chamber Deep and quiet Deep and quiet   Iris Round and reactive Round and reactive   Lens 2+ Nuclear sclerosis Posterior chamber intraocular lens   Anterior Vitreous Normal  Normal       Fundus Exam      Right Left   Posterior Vitreous  Posterior vitreous detachment   Disc  Normal   C/D Ratio  0.1   Macula  Retinal pigment epithelial mottling, Early age related macular degeneration, no hemorrhage, Hard drusen, no macular thickening, no exudates   Vessels  Normal   Periphery  Normal          IMAGING AND PROCEDURES  Imaging and Procedures for 04/30/20  OCT, Retina - OU - Both Eyes       Right Eye Quality was good. Scan locations included subfoveal. Central Foveal Thickness: 296. Progression has been stable. Findings include retinal drusen , epiretinal membrane.   Left Eye Quality was good. Scan locations included subfoveal. Central Foveal Thickness: 300. Progression has improved. Findings include retinal drusen .   Notes OS, vastly improved macular anatomy over the last 12 months on intravitreal therapy.  We will repeat intravitreal Avastin OS today at 10-week interval and extend the interval examination to 12 weeks  OD, minor change without topographic distortion to the fovea from the epiretinal membrane, and no active CNVM       Intravitreal Injection, Pharmacologic Agent - OS - Left Eye       Time Out 04/30/2020. 11:32 AM. Confirmed correct patient, procedure, site, and patient consented.   Anesthesia Topical anesthesia was used. Anesthetic medications included [place] 3.5%.   Procedure Preparation included 10% betadine to eyelids, 5% betadine to ocular surface, Tobramycin 0.3%.   Injection:  1.25 mg Bevacizumab (AVASTIN) SOLN   NDC: [address]-2, Lot: [id number]   Route: Intravitreal, Site: Left Eye, Waste: 0 mg  Post-op Post injection exam found visual acuity of at least counting fingers. The patient tolerated the procedure well. There were no complications. The patient received written and verbal post procedure care education. Post injection medications were not given.                 ASSESSMENT/PLAN:  Exudative age-related  macular degeneration of left eye with active choroidal neovascularization (HCC) OS vastly improved over the last 1 year,  stable today at 10 weeks post intravitreal Avastin, will repeat injection today and examination in 12 weeks  Macular pucker, right eye Minor no topographic distortion      ICD-10-CM   1. Exudative age-related macular degeneration of left eye with active choroidal neovascularization (HCC)  H35.3221 OCT, Retina - OU - Both Eyes    Intravitreal Injection, Pharmacologic Agent - OS - Left Eye    Bevacizumab (AVASTIN) SOLN 1.25 mg  2. Macular pucker, right eye  H35.371     1.  Repeat injection intravitreal Avastin OS today at 10 weeks and examination in 12 weeks left eye  2.  3.  Ophthalmic Meds Ordered this visit:  Meds ordered this encounter  Medications  . Bevacizumab (AVASTIN) SOLN 1.25 mg       Return in about 12 weeks (around 07/23/2020) for dilate, OS, AVASTIN OCT.  There are no Patient Instructions on file for this visit.   Explained the diagnoses, plan, and follow up with the patient and they expressed understanding.  Patient expressed understanding of the importance of proper follow up care.   Clent Demark Abenezer Odonell M.D. Diseases & Surgery of the Retina and Vitreous [facility]ina [facility] Diabetic [place] 04/30/20     Abbreviations: M myopia (nearsighted); A astigmatism; H hyperopia (farsighted); P presbyopia; Mrx spectacle prescription;  CTL contact lenses; OD right eye; OS left eye; OU both eyes  XT exotropia; ET esotropia; PEK punctate epithelial keratitis; PEE punctate epithelial erosions; DES dry eye syndrome; MGD meibomian gland dysfunction; ATs artificial tears; PFAT's preservative free artificial tears; [place] nuclear sclerotic cataract; PSC posterior subcapsular cataract; ERM epi-retinal membrane; PVD posterior vitreous detachment; RD retinal detachment; DM diabetes mellitus; DR diabetic retinopathy; NPDR non-proliferative diabetic retinopathy; PDR proliferative  diabetic retinopathy; CSME clinically significant macular edema; DME diabetic macular edema; dbh dot blot hemorrhages; CWS cotton wool spot; POAG primary open angle glaucoma; C/D cup-to-disc ratio; HVF humphrey visual field; GVF goldmann visual field; OCT optical coherence tomography; IOP intraocular pressure; BRVO Branch retinal vein occlusion; CRVO central retinal vein occlusion; CRAO central retinal artery occlusion; BRAO branch retinal artery occlusion; RT retinal tear; SB scleral buckle; PPV pars plana vitrectomy; VH Vitreous hemorrhage; PRP panretinal laser photocoagulation; IVK intravitreal kenalog; VMT vitreomacular traction; MH Macular hole;  NVD neovascularization of the disc; NVE neovascularization elsewhere; AREDS age related eye disease study; ARMD age related macular degeneration; POAG primary open angle glaucoma; EBMD epithelial/anterior basement membrane dystrophy; ACIOL anterior chamber intraocular lens; IOL intraocular lens; PCIOL posterior chamber intraocular lens; Phaco/IOL phacoemulsification with intraocular lens placement; [place] photorefractive keratectomy; LASIK laser assisted in situ keratomileusis; HTN hypertension; DM diabetes mellitus; COPD chronic obstructive pulmonary disease

## 2020-05-09 IMAGING — MR MRI HEAD WITHOUT CONTRAST
10 of 11 series · 43 of 48 positions shown · non-contrast
Comparison: CT head without contrast 12/12/2018

CLINICAL DATA: Altered level of consciousness. Episode of
unresponsiveness and [place] Sanna lasting approximately 5-10
minutes.

EXAM:
MRI HEAD WITHOUT CONTRAST
TECHNIQUE: Multiplanar, multiecho pulse sequences of the brain and surrounding
structures were obtained without intravenous contrast.

[Series 5: DWI · axial · 3.0mm · 0.88mm/px · z∈[+1,+145]mm · 10 of 100 slices shown (1 of 4)]
[im 1/100]
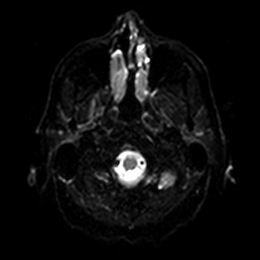
[im 12/100]
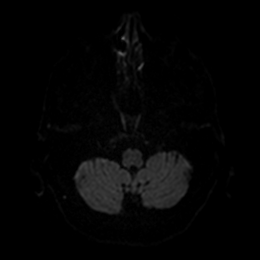
[im 23/100]
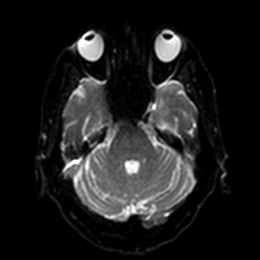
[im 34/100]
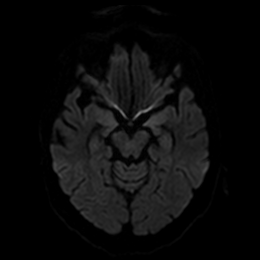
[im 45/100]
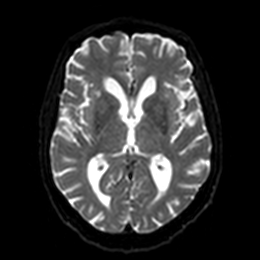
[im 56/100]
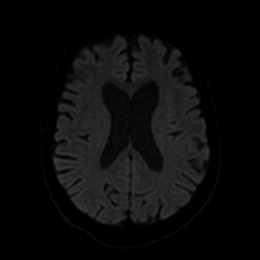
[im 67/100]
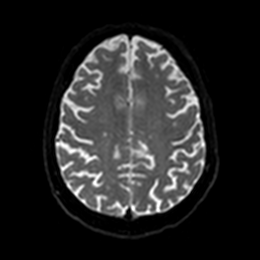
[im 78/100]
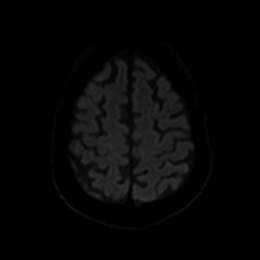
[im 89/100]
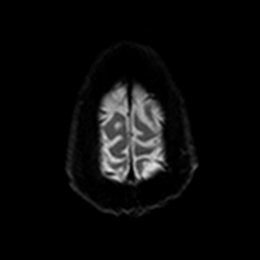
[im 100/100]
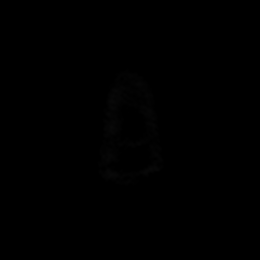

[Series 6: DWI · axial · 3.0mm · 0.88mm/px · z∈[+1,+145]mm · 4 of 50 slices shown (2 of 4)]
[im 1/50]
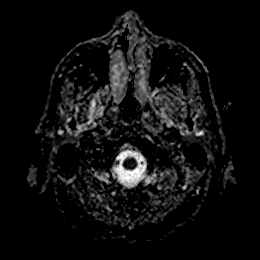
[im 17/50]
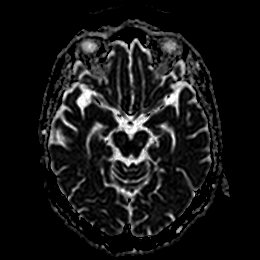
[im 33/50]
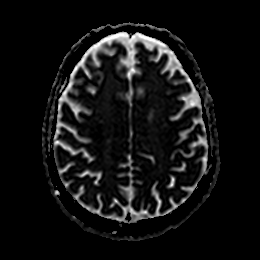
[im 50/50]
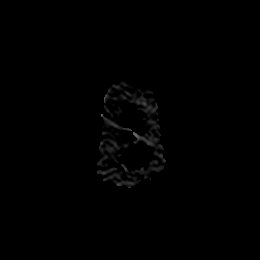

[Series 7: DWI · coronal · 4.0mm · 0.88mm/px · 7 of 76 slices shown (3 of 4)]
[im 1/76]
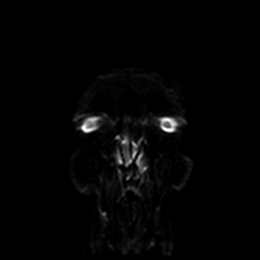
[im 13/76]
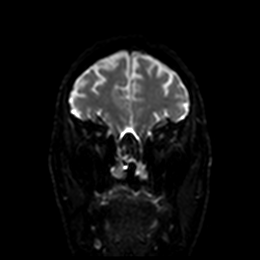
[im 26/76]
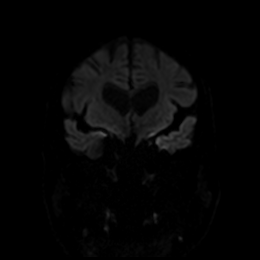
[im 38/76]
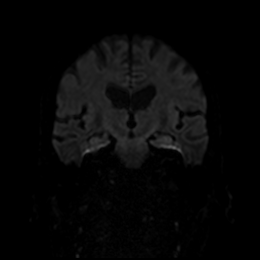
[im 51/76]
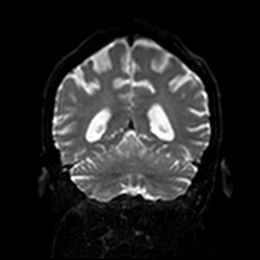
[im 63/76]
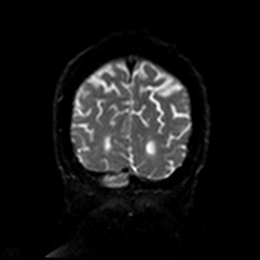
[im 76/76]
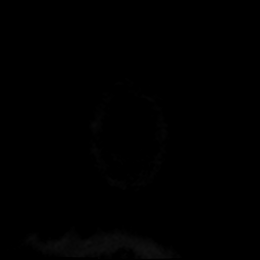

[Series 8: DWI · coronal · 4.0mm · 0.88mm/px · 3 of 38 slices shown (4 of 4)]
[im 1/38]
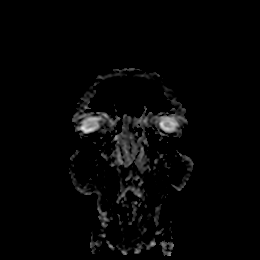
[im 19/38]
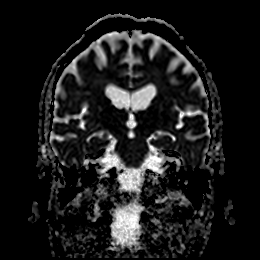
[im 38/38]
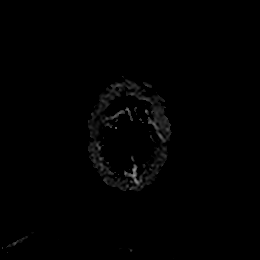

[Series 9: T1 · sagittal · 5.0mm · 0.75mm/px · 2 of 25 slices shown]
[im 1/25]
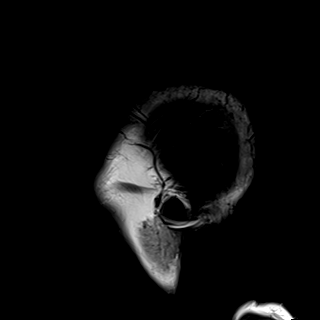
[im 25/25]
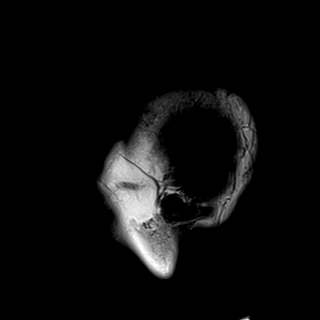

[Series 10: T2 · axial · 5.0mm · 0.72mm/px · z∈[+7,+153]mm · 2 of 26 slices shown (1 of 2)]
[im 1/26]
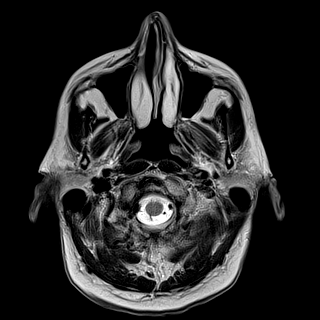
[im 26/26]
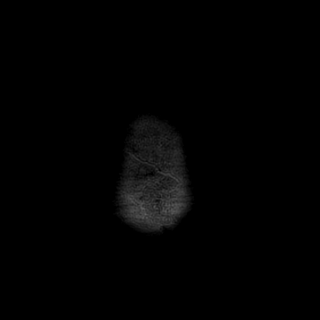

[Series 11: FLAIR · axial · 5.0mm · 0.45mm/px · z∈[+9,+154]mm · 2 of 26 slices shown]
[im 1/26]
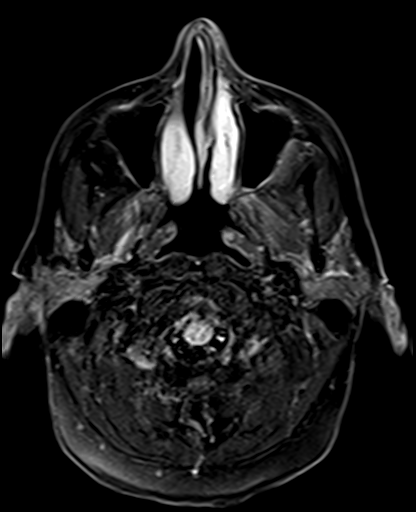
[im 26/26]
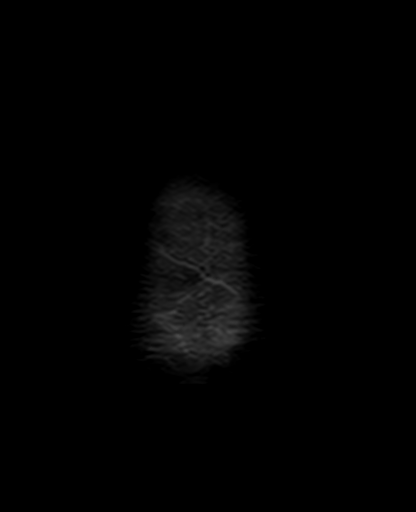

[Series 13: pha_images · axial · 3.0mm · 0.90mm/px · z∈[-5,+167]mm · 5 of 60 slices shown]
[im 1/60]
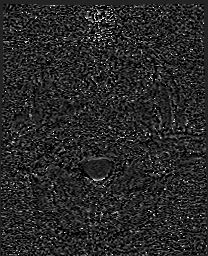
[im 15/60]
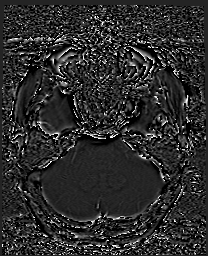
[im 30/60]
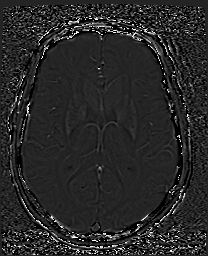
[im 45/60]
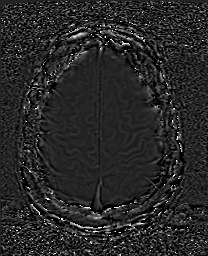
[im 60/60]
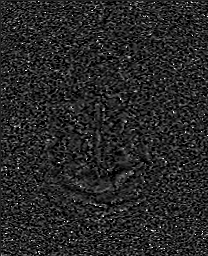

[Series 14: swi_images · axial · 3.0mm · 0.90mm/px · z∈[-5,+167]mm · 5 of 60 slices shown]
[im 1/60]
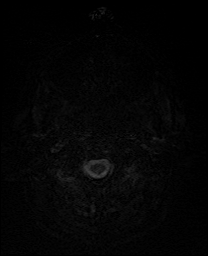
[im 15/60]
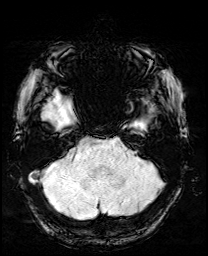
[im 30/60]
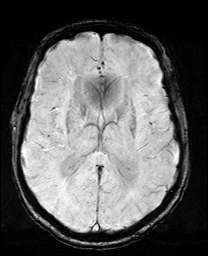
[im 45/60]
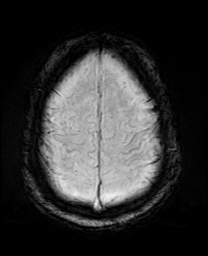
[im 60/60]
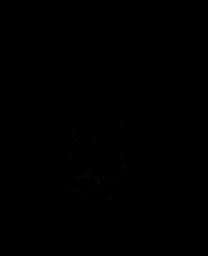

[Series 17: T2 · coronal · 5.0mm · 0.34mm/px · 3 of 31 slices shown (2 of 2)]
[im 1/31]
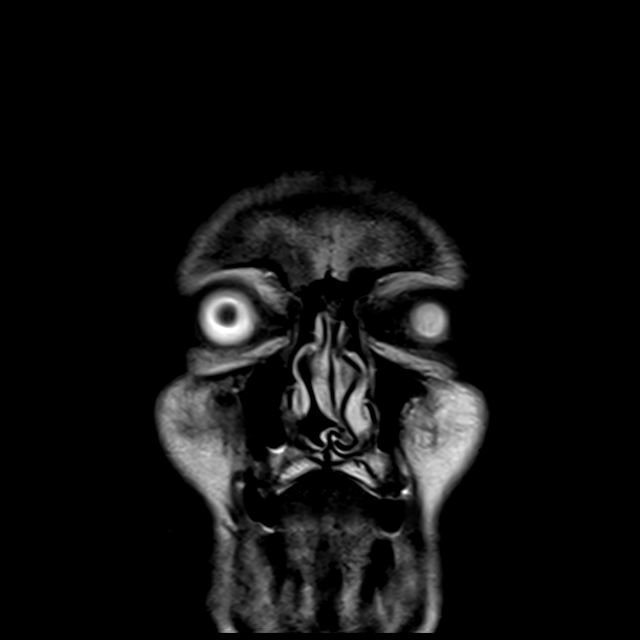
[im 16/31]
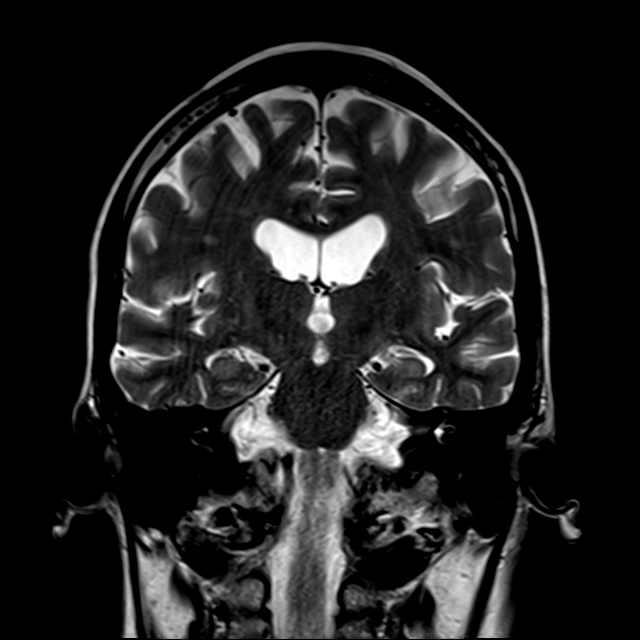
[im 31/31]
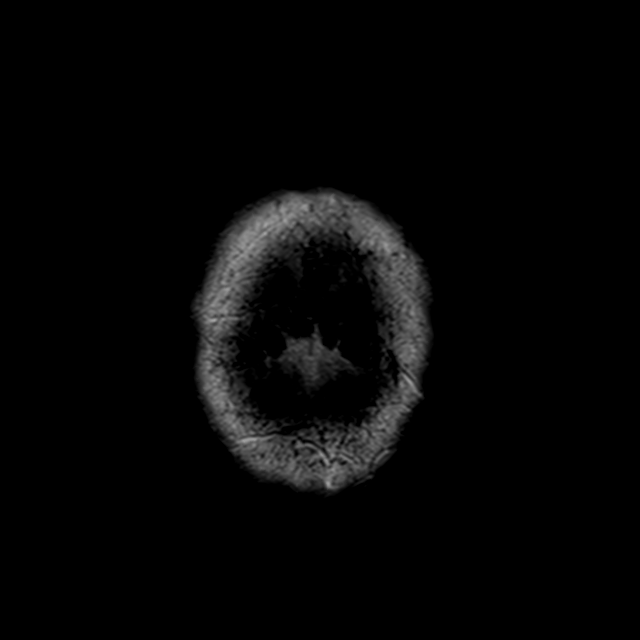

[43 of 48 positions shown; findings below may reference images not displayed]

FINDINGS: Brain: No acute infarct, hemorrhage, or mass lesion is present. Mild
atrophy and white matter changes are normal for age. Remote lacunar
infarcts are present in the right cerebellum. A remote lacunar
infarct is also present in the left thalamus. No other focal
infarcts are present. The ventricles are of proportionate to the
degree of atrophy. The internal auditory canals are within normal
limits. The brainstem and cerebellum are otherwise within normal
limits.

Vascular: Flow is present in the major intracranial arteries.

Skull and upper cervical spine: The craniocervical junction is
normal. Upper cervical spine is within normal limits. Marrow signal
is unremarkable.

Sinuses/Orbits: The paranasal sinuses and mastoid air cells are
clear. The globes and orbits are within normal limits.
IMPRESSION: 1. No acute or focal intracranial abnormality to explain the
patient's episode of unresponsiveness and staring.
2. Remote lacunar infarcts involving the right cerebellum and left
thalamus.
3. MRI appearance of the brain is otherwise normal for age.

## 2020-05-27 ENCOUNTER — Telehealth: Payer: Self-pay | Admitting: Cardiology

## 2020-05-27 NOTE — Telephone Encounter (Signed)
Pt c/o Shortness Of Breath: STAT if SOB developed within the last 24 hours or pt is noticeably SOB on the phone  1. Are you currently SOB (can you hear that pt is SOB on the phone)? A little   2. How long have you been experiencing SOB?  A couple months but it has been getting worse   3. Are you SOB when sitting or when up moving around? Up and moving around  4. Are you currently experiencing any other symptoms? No   SOB has gotten worse over the past couple months. The family wanted to know if there was any testing or anything that may need to be done.

## 2020-05-27 NOTE — Telephone Encounter (Signed)
Called patient per Dr. Geraldo Pitter scheduled him for an appointment with him tomorrow. No further questions.

## 2020-05-28 ENCOUNTER — Other Ambulatory Visit: Payer: Self-pay

## 2020-05-28 ENCOUNTER — Encounter: Payer: Self-pay | Admitting: Cardiology

## 2020-05-28 ENCOUNTER — Ambulatory Visit: Payer: Medicare Other | Admitting: Cardiology

## 2020-05-28 VITALS — BP 124/68 | HR 58 | Ht 66.0 in | Wt 163.0 lb

## 2020-05-28 DIAGNOSIS — R0600 Dyspnea, unspecified: Secondary | ICD-10-CM

## 2020-05-28 DIAGNOSIS — E785 Hyperlipidemia, unspecified: Secondary | ICD-10-CM

## 2020-05-28 DIAGNOSIS — R0609 Other forms of dyspnea: Secondary | ICD-10-CM

## 2020-05-28 DIAGNOSIS — Z87891 Personal history of nicotine dependence: Secondary | ICD-10-CM

## 2020-05-28 DIAGNOSIS — I2510 Atherosclerotic heart disease of native coronary artery without angina pectoris: Secondary | ICD-10-CM

## 2020-05-28 HISTORY — DX: Atherosclerotic heart disease of native coronary artery without angina pectoris: I25.10

## 2020-05-28 MED ORDER — METOPROLOL TARTRATE 50 MG PO TABS: ORAL_TABLET | ORAL | 0 refills | Status: DC

## 2020-05-28 NOTE — Progress Notes (Signed)
Cardiology Office Note:    Date:  05/28/2020   ID:  Frank Meyer, DOB 05/22/1941, MRN [id number]  PCP:  Burnard Bunting, MD  Cardiologist:  Jenean Lindau, MD   Referring MD: Burnard Bunting, MD    ASSESSMENT:    1. Dyspnea on exertion   2. Coronary artery calcification seen on CAT scan   3. History of smoking    PLAN:    In order of problems listed above:  1. Coronary artery disease: Secondary prevention stressed with the patient.  Importance of compliance with diet medication stressed and vocalized understanding.  He is on statin therapy and aspirin and lipids are followed by primary care physician.  Diet was emphasized. 2. Mixed dyslipidemia: On statin therapy as discussed above. 3. Dyspnea on exertion: This is concerning.  I will get a D-dimer today.  If this is negative we will do a CT coronary angiography with FFR to understand if any obstructive coronary etiology is the reason behind his symptoms.  He is agreeable.  He knows to go to the nearest emergency room for any concerning symptoms. 4. He will be seen follow-up appointment in a month or earlier if he has any concerns he will follow up with Dr. Agustin Cree.  He had multiple questions which were answered to his satisfaction.   Medication Adjustments/Labs and Tests Ordered: Current medicines are reviewed at length with the patient today.  Concerns regarding medicines are outlined above.  No orders of the defined types were placed in this encounter.  No orders of the defined types were placed in this encounter.    No chief complaint on file.    History of Present Illness:    Frank Meyer is a 79 y.o. male.  Patient has past medical history of coronary artery disease diagnosed by coronary calcifications on CT scan in the remote past.  He has history of dyslipidemia and is on statin and aspirin.  He mentions to me that he has effort tolerance is gradually declined because of shortness of breath.  He was  evaluated by his pulmonologist for pulmonary hypertension.  Dr. Agustin Cree reviewed echocardiogram and the right-sided pressures were unremarkable.  Patient is here for the same evaluation.  At the time of my evaluation, the patient is alert awake oriented and in no distress.  Past Medical History:  Diagnosis Date   Basal cell carcinoma    Benign prostatic hypertrophy    Dyslipidemia    Gallbladder problem    Gastroesophageal reflux disease    Hiatal hernia    Mild memory disturbance    TIA (transient ischemic attack)     Past Surgical History:  Procedure Laterality Date   none      Current Medications: Current Meds  Medication Sig   aspirin 81 MG tablet Take 81 mg by mouth daily.    atorvastatin (LIPITOR) 20 MG tablet Take 20 mg by mouth daily.   cholecalciferol (VITAMIN D) 1000 UNITS tablet Take 1,000 Units by mouth daily.   donepezil (ARICEPT) 10 MG tablet TAKE 1 TABLET BY MOUTH  DAILY   Fexofenadine-Pseudoephedrine (ALLEGRA-D 24 HOUR PO) Take 1 tablet by mouth daily.   finasteride (PROSCAR) 5 MG tablet Take 5 mg by mouth daily.   ipratropium (ATROVENT) 0.06 % nasal spray Place 2 sprays into both nostrils daily as needed for rhinitis.    Melatonin 10 MG TABS Take 10 mg by mouth at bedtime.   Multiple Vitamins-Minerals (MULTIVITAMIN ADULTS 50+) TABS Take 1 tablet by mouth  daily.    omeprazole (PRILOSEC) 40 MG capsule Take 40 mg by mouth daily.    thiamine 100 MG tablet Take 1 tablet (100 mg total) by mouth daily.   traZODone (DESYREL) 100 MG tablet TAKE 2 TABLETS BY MOUTH AT  BEDTIME     Allergies:   Patient has no known allergies.   Social History   Socioeconomic History   Marital status: Married    Spouse name: pamela   Number of children: 2   Years of education: college   Highest education level: Not on file  Occupational History   Occupation: retired  Tobacco Use   Smoking status: Former Smoker    Quit date: 05/04/1979    Years since  quitting: 41.0   Smokeless tobacco: Never Used  Substance and Sexual Activity   Alcohol use: Yes    Alcohol/week: 15.0 - 20.0 standard drinks    Types: 15 - 20 Cans of beer per week   Drug use: Never   Sexual activity: Not on file  Other Topics Concern   Not on file  Social History Narrative   Pt is retired. He is married. The pt lives with his wife. He has a Best boy. The pt has 2 children.      Patient drinks 2-3 cups of caffeine daily.   Patient is right handed.    Social Determinants of Health   Financial Resource Strain:    Difficulty of Paying Living Expenses: Not on file  Food Insecurity:    Worried About Charity fundraiser in the Last Year: Not on file   [employer] of Food in the Last Year: Not on file  Transportation Needs:    Lack of Transportation (Medical): Not on file   Lack of Transportation (Non-Medical): Not on file  Physical Activity:    Days of Exercise per Week: Not on file   Minutes of Exercise per Session: Not on file  Stress:    Feeling of Stress : Not on file  Social Connections:    Frequency of Communication with Friends and Family: Not on file   Frequency of Social Gatherings with Friends and Family: Not on file   Attends Religious Services: Not on file   Active Member of Clubs or Organizations: Not on file   Attends Archivist Meetings: Not on file   Marital Status: Not on file     Family History: The patient's family history includes Memory loss in his mother; Obesity in his mother.  ROS:   Please see the history of present illness.    All other systems reviewed and are negative.  EKGs/Labs/Other Studies Reviewed:    The following studies were reviewed today: IMPRESSIONS    1. Left ventricular ejection fraction, by estimation, is 55 to 60%. The  left ventricle has normal function. The left ventricle has no regional  wall motion abnormalities. Left ventricular diastolic parameters are  consistent  with Grade I diastolic  dysfunction (impaired relaxation).  2. Right ventricular systolic function is normal. The right ventricular  size is mildly enlarged. There is moderately elevated pulmonary artery  systolic pressure.  3. Left atrial size was mildly dilated.  4. Right atrial size was mildly dilated.  5. The mitral valve is normal in structure. Mild mitral valve  regurgitation. No evidence of mitral stenosis.  6. The aortic valve is mildly thickened and mildly calcified. Aortic  valve regurgitation is mild. No aortic stenosis is present.  7. There is mild dilatation of  the ascending aorta measuring 39 mm.  8. The inferior vena cava is normal in size with greater than 50%  respiratory variability, suggesting right atrial pressure of 3 mmHg.    Recent Labs: No results found for requested labs within last 8760 hours.  Recent Lipid Panel No results found for: CHOL, TRIG, HDL, CHOLHDL, VLDL, LDLCALC, LDLDIRECT  Physical Exam:    VS:  BP 124/68    Pulse (!) 58    Ht 5\' 6"  (1.676 m)    Wt 163 lb 0.6 oz (74 kg)    SpO2 93%    BMI 26.32 kg/m     Wt Readings from Last 3 Encounters:  05/28/20 163 lb 0.6 oz (74 kg)  12/06/19 158 lb (71.7 kg)  05/31/19 157 lb (71.2 kg)     GEN: Patient is in no acute distress HEENT: Normal NECK: No JVD; No carotid bruits LYMPHATICS: No lymphadenopathy CARDIAC: Hear sounds regular, 2/6 systolic murmur at the apex. RESPIRATORY:  Clear to auscultation without rales, wheezing or rhonchi  ABDOMEN: Soft, non-tender, non-distended MUSCULOSKELETAL:  No edema; No deformity  SKIN: Warm and dry NEUROLOGIC:  Alert and oriented x 3 PSYCHIATRIC:  Normal affect   Signed, Jenean Lindau, MD  05/28/2020 10:11 AM    [place]

## 2020-05-28 NOTE — Patient Instructions (Signed)
Medication Instructions:  No medication changes. *If you need a refill on your cardiac medications before your next appointment, please call your pharmacy*   Lab Work: Your physician recommends that you had a stat d-dimer today in the office.  If you have labs (blood work) drawn today and your tests are completely normal, you will receive your results only by: Marland Kitchen MyChart Message (if you have MyChart) OR . A paper copy in the mail If you have any lab test that is abnormal or we need to change your treatment, we will call you to review the results.   Testing/Procedures: Your cardiac CT will be scheduled at:   [facility] [place], [place] [address] [phone]   If scheduled at [facility], please arrive at the [facility] main entrance of [facility] 30 minutes prior to test start time. Proceed to the [facility] Radiology Department (first floor) to check-in and test prep.  Please follow these instructions carefully (unless otherwise directed):  Hold all erectile dysfunction medications at least 3 days (72 hrs) prior to test.  On the Night Before the Test: . Be sure to Drink plenty of water. . Do not consume any caffeinated/decaffeinated beverages or chocolate 12 hours prior to your test. . Do not take any antihistamines 12 hours prior to your test.  On the Day of the Test: . Drink plenty of water. Do not drink any water within one hour of the test. . Do not eat any food 4 hours prior to the test. . You may take your regular medications prior to the test.  . Take metoprolol (Lopressor) two hours prior to test. A one time dose has been sent to your pharmacy to take prior to your CT scan.      After the Test: . Drink plenty of water. . After receiving IV contrast, you may experience a mild flushed feeling. This is normal. . On occasion, you may experience a mild rash up to 24 hours after the test. This is not dangerous. If this  occurs, you can take Benadryl 25 mg and increase your fluid intake. . If you experience trouble breathing, this can be serious. If it is severe call 911 IMMEDIATELY. If it is mild, please call our office. . If you take any of these medications: Glipizide/Metformin, Avandament, Glucavance, please do not take 48 hours after completing test unless otherwise instructed.   Once we have confirmed authorization from your insurance company, we will call you to set up a date and time for your test. Based on how quickly your insurance processes prior authorizations requests, please allow up to 4 weeks to be contacted for scheduling your Cardiac CT appointment. Be advised that routine Cardiac CT appointments could be scheduled as many as 8 weeks after your provider has ordered it.  For non-scheduling related questions, please contact the cardiac imaging nurse navigator should you have any questions/concerns: [name], Cardiac Imaging Nurse Navigator Burley Saver, Interim Cardiac Imaging Nurse [place] [facility] Direct Office Dial: [phone]   For scheduling needs, including cancellations and rescheduling, please call Vivien Rota at [phone].     Follow-Up: At [facility], you and your health needs are our priority.  As part of our continuing mission to provide you with exceptional heart care, we have created designated Provider Care Teams.  These Care Teams include your primary Cardiologist (physician) and Advanced Practice Providers (APPs -  Physician Assistants and Nurse Practitioners) who  all work together to provide you with the care you need, when you need it.  We recommend signing up for the patient portal called "MyChart".  Sign up information is provided on this After Visit Summary.  MyChart is used to connect with patients for Virtual Visits (Telemedicine).  Patients are able to view lab/test results, encounter notes, upcoming appointments, etc.  Non-urgent messages  can be sent to your provider as well.   To learn more about what you can do with MyChart, go to [email or web address].    Your next appointment:   1 month(s)  The format for your next appointment:   In Person  Provider:   Jenne Campus, MD   Other Instructions Cardiac CT Angiogram A cardiac CT angiogram is a procedure to look at the heart and the area around the heart. It may be done to help find the cause of chest pains or other symptoms of heart disease. During this procedure, a substance called contrast dye is injected into the blood vessels in the area to be checked. A large X-ray machine, called a CT scanner, then takes detailed pictures of the heart and the surrounding area. The procedure is also sometimes called a coronary CT angiogram, coronary artery scanning, or CTA. A cardiac CT angiogram allows the health care provider to see how well blood is flowing to and from the heart. The health care provider will be able to see if there are any problems, such as:  Blockage or narrowing of the coronary arteries in the heart.  Fluid around the heart.  Signs of weakness or disease in the muscles, valves, and tissues of the heart. Tell a health care provider about:  Any allergies you have. This is especially important if you have had a previous allergic reaction to contrast dye.  All medicines you are taking, including vitamins, herbs, eye drops, creams, and over-the-counter medicines.  Any blood disorders you have.  Any surgeries you have had.  Any medical conditions you have.  Whether you are pregnant or may be pregnant.  Any anxiety disorders, chronic pain, or other conditions you have that may increase your stress or prevent you from lying still. What are the risks? Generally, this is a safe procedure. However, problems may occur, including: 1. Bleeding. 2. Infection. 3. Allergic reactions to medicines or dyes. 4. Damage to other structures or organs. 5. Kidney  damage from the contrast dye that is used. 6. Increased risk of cancer from radiation exposure. This risk is low. Talk with your health care provider about: ? The risks and benefits of testing. ? How you can receive the lowest dose of radiation. What happens before the procedure? 1. Wear comfortable clothing and remove any jewelry, glasses, dentures, and hearing aids. 2. Follow instructions from your health care provider about eating and drinking. This may include: ? For 12 hours before the procedure -- avoid caffeine. This includes tea, coffee, soda, energy drinks, and diet pills. Drink plenty of water or other fluids that do not have caffeine in them. Being well hydrated can prevent complications. ? For 4-6 hours before the procedure -- stop eating and drinking. The contrast dye can cause nausea, but this is less likely if your stomach is empty. 3. Ask your health care provider about changing or stopping your regular medicines. This is especially important if you are taking diabetes medicines, blood thinners, or medicines to treat problems with erections (erectile dysfunction). What happens during the procedure?  1. Hair on your chest may need  to be removed so that small sticky patches called electrodes can be placed on your chest. These will transmit information that helps to monitor your heart during the procedure. 2. An IV will be inserted into one of your veins. 3. You might be given a medicine to control your heart rate during the procedure. This will help to ensure that good images are obtained. 4. You will be asked to lie on an exam table. This table will slide in and out of the CT machine during the procedure. 5. Contrast dye will be injected into the IV. You might feel warm, or you may get a metallic taste in your mouth. 6. You will be given a medicine called nitroglycerin. This will relax or dilate the arteries in your heart. 7. The table that you are lying on will move into the CT  machine tunnel for the scan. 8. The person running the machine will give you instructions while the scans are being done. You may be asked to: ? Keep your arms above your head. ? Hold your breath. ? Stay very still, even if the table is moving. 9. When the scanning is complete, you will be moved out of the machine. 10. The IV will be removed. The procedure may vary among health care providers and hospitals. What can I expect after the procedure? After your procedure, it is common to have:  A metallic taste in your mouth from the contrast dye.  A feeling of warmth.  A headache from the nitroglycerin. Follow these instructions at home:  Take over-the-counter and prescription medicines only as told by your health care provider.  If you are told, drink enough fluid to keep your urine pale yellow. This will help to flush the contrast dye out of your body.  Most people can return to their normal activities right after the procedure. Ask your health care provider what activities are safe for you.  It is up to you to get the results of your procedure. Ask your health care provider, or the department that is doing the procedure, when your results will be ready.  Keep all follow-up visits as told by your health care provider. This is important. Contact a health care provider if: 1. You have any symptoms of allergy to the contrast dye. These include: ? Shortness of breath. ? Rash or hives. ? A racing heartbeat. Summary  A cardiac CT angiogram is a procedure to look at the heart and the area around the heart. It may be done to help find the cause of chest pains or other symptoms of heart disease.  During this procedure, a large X-ray machine, called a CT scanner, takes detailed pictures of the heart and the surrounding area after a contrast dye has been injected into blood vessels in the area.  Ask your health care provider about changing or stopping your regular medicines before the  procedure. This is especially important if you are taking diabetes medicines, blood thinners, or medicines to treat erectile dysfunction.  If you are told, drink enough fluid to keep your urine pale yellow. This will help to flush the contrast dye out of your body. This information is not intended to replace advice given to you by your health care provider. Make sure you discuss any questions you have with your health care provider. Document Revised: 03/29/2019 Document Reviewed: 03/29/2019 Elsevier Patient Education  [place].

## 2020-05-29 ENCOUNTER — Encounter: Payer: Self-pay | Admitting: Neurology

## 2020-05-29 ENCOUNTER — Ambulatory Visit: Payer: Medicare Other | Admitting: Neurology

## 2020-05-29 ENCOUNTER — Other Ambulatory Visit: Payer: Self-pay

## 2020-05-29 VITALS — BP 122/71 | HR 55 | Ht 66.0 in | Wt 164.5 lb

## 2020-05-29 DIAGNOSIS — R413 Other amnesia: Secondary | ICD-10-CM

## 2020-05-29 DIAGNOSIS — E875 Hyperkalemia: Secondary | ICD-10-CM

## 2020-05-29 LAB — BASIC METABOLIC PANEL
BUN/Creatinine Ratio: 15 (ref 10–24)
BUN: 17 mg/dL (ref 8–27)
CO2: 25 mmol/L (ref 20–29)
Calcium: 8.9 mg/dL (ref 8.6–10.2)
Chloride: 100 mmol/L (ref 96–106)
Creatinine, Ser: 1.13 mg/dL (ref 0.76–1.27)
GFR calc Af Amer: 71 mL/min/{1.73_m2} (ref >59)
GFR calc non Af Amer: 61 mL/min/{1.73_m2} (ref >59)
Glucose: 77 mg/dL (ref 65–99)
Potassium: 6 mmol/L — ABNORMAL HIGH (ref 3.5–5.2)
Sodium: 139 mmol/L (ref 134–144)

## 2020-05-29 LAB — D-DIMER, QUANTITATIVE: D-DIMER: 0.41 mg/L FEU (ref 0.00–0.49)

## 2020-05-29 NOTE — Progress Notes (Signed)
Reason for visit: Memory disturbance  Frank Meyer is an 79 y.o. male  History of present illness:  Frank Meyer is a 79 year old right-handed white male with a history of a chronic memory issue.  The patient has gone many years with minimal progression of his memory, he remains on Aricept 10 mg daily.  He is still doing the finances, he plays golf regularly, he uses GPS to get around or directions.  The patient has not given up any activities of daily living because of memory.  He does report some word finding problems at times.  He occasionally have some troubles with insomnia, takes melatonin and trazodone for this.  He reports that he has had problems with pulmonary hypertension, he has some dyspnea on exertion, this is worsening over time.  Past Medical History:  Diagnosis Date  . Basal cell carcinoma   . Benign prostatic hypertrophy   . Dyslipidemia   . Gallbladder problem   . Gastroesophageal reflux disease   . Hiatal hernia   . Mild memory disturbance   . Pulmonary hypertension ([place])   . TIA (transient ischemic attack)     Past Surgical History:  Procedure Laterality Date  . none      Family History  Problem Relation Age of Onset  . Obesity Mother   . Memory loss Mother     Social history:  reports that he quit smoking about 41 years ago. He has never used smokeless tobacco. He reports current alcohol use of about 15.0 - 20.0 standard drinks of alcohol per week. He reports that he does not use drugs.   No Known Allergies  Medications:  Prior to Admission medications   Medication Sig Start Date End Date Taking? Authorizing Provider  aspirin 81 MG tablet Take 81 mg by mouth daily.    Yes [provider]  atorvastatin (LIPITOR) 20 MG tablet Take 20 mg by mouth daily.   Yes [provider]  cholecalciferol (VITAMIN D) 1000 UNITS tablet Take 1,000 Units by mouth daily.   Yes [provider]  donepezil (ARICEPT) 10 MG tablet TAKE 1  TABLET BY MOUTH  DAILY 03/25/20  Yes Millikan, Megan, NP  Fexofenadine-Pseudoephedrine (ALLEGRA-D 24 HOUR PO) Take 1 tablet by mouth daily.   Yes [provider]  finasteride (PROSCAR) 5 MG tablet Take 5 mg by mouth daily.   Yes [provider]  ipratropium (ATROVENT) 0.06 % nasal spray Place 2 sprays into both nostrils daily as needed for rhinitis.  02/09/13  Yes [provider]  Melatonin 10 MG TABS Take 10 mg by mouth at bedtime.   Yes [provider]  metoprolol tartrate (LOPRESSOR) 50 MG tablet Take 50 mg Lopressor 2 hours prior to your CT scan for a heart rate greater than 55. 05/28/20  Yes Revankar, Reita Cliche, MD  Multiple Vitamins-Minerals (MULTIVITAMIN ADULTS 50+) TABS Take 1 tablet by mouth daily.    Yes [provider]  omeprazole (PRILOSEC) 40 MG capsule Take 40 mg by mouth daily.  03/29/13  Yes [provider]  thiamine 100 MG tablet Take 1 tablet (100 mg total) by mouth daily. 12/13/18  Yes Vann, Jessica U, DO  traZODone (DESYREL) 100 MG tablet TAKE 2 TABLETS BY MOUTH AT  BEDTIME 10/24/19  Yes Millikan, Jinny Blossom, NP    ROS:  Out of a complete 14 system review of symptoms, the patient complains only of the following symptoms, and all other reviewed systems are negative.  Memory problems Shortness  of breath  Blood pressure 122/71, pulse (!) 55, height 5\' 6"  (1.676 m), weight 164 lb 8 oz (74.6 kg).  Physical Exam  General: The patient is alert and cooperative at the time of the examination.  Skin: No significant peripheral edema is noted.   Neurologic Exam  Mental status: The patient is alert and oriented x 3 at the time of the examination. The patient has apparent normal recent and remote memory, with an apparently normal attention span and concentration ability. Mini-Mental status examination done today shows a total score 29/30.   Cranial nerves: Facial symmetry is present. Speech is normal, no aphasia or dysarthria is noted.  Extraocular movements are full. Visual fields are full.  Motor: The patient has good strength in all 4 extremities.  Sensory examination: Soft touch sensation is symmetric on the face, arms, and legs.  Coordination: The patient has good finger-nose-finger and heel-to-shin bilaterally.  Gait and station: The patient has a normal gait. Tandem gait is normal. Romberg is negative. No drift is seen.  Reflexes: Deep tendon reflexes are symmetric.   Assessment/Plan:  1.  Memory disturbance  The patient will continue on the Aricept for now, he seems to be progressing extremely slowly over time.  He has not given up any activities day living.  He will follow-up here in 1 year, sooner if needed.  Jill Alexanders MD 05/29/2020 10:00 AM  [facility] [address] [place], [place] [address]  Phone [phone] Fax [phone]

## 2020-05-30 ENCOUNTER — Other Ambulatory Visit: Payer: Self-pay | Admitting: Cardiology

## 2020-05-30 DIAGNOSIS — N289 Disorder of kidney and ureter, unspecified: Secondary | ICD-10-CM

## 2020-05-31 LAB — BASIC METABOLIC PANEL WITH GFR
BUN/Creatinine Ratio: 15 (ref 10–24)
BUN: 23 mg/dL (ref 8–27)
CO2: 25 mmol/L (ref 20–29)
Calcium: 8.6 mg/dL (ref 8.6–10.2)
Chloride: 100 mmol/L (ref 96–106)
Creatinine, Ser: 1.52 mg/dL — ABNORMAL HIGH (ref 0.76–1.27)
GFR calc Af Amer: 50 mL/min/1.73 — ABNORMAL LOW
GFR calc non Af Amer: 43 mL/min/1.73 — ABNORMAL LOW
Glucose: 90 mg/dL (ref 65–99)
Potassium: 5 mmol/L (ref 3.5–5.2)
Sodium: 136 mmol/L (ref 134–144)

## 2020-05-31 NOTE — Progress Notes (Signed)
Results reviewed with pt as per Dr. Revankar's note.  Pt verbalized understanding and had no additional questions. Routed to PCP.  

## 2020-05-31 NOTE — Addendum Note (Signed)
Addended by: Truddie Hidden on: 05/31/2020 01:07 PM   Modules accepted: Orders

## 2020-06-05 ENCOUNTER — Telehealth (HOSPITAL_COMMUNITY): Payer: Self-pay | Admitting: Emergency Medicine

## 2020-06-05 ENCOUNTER — Telehealth: Payer: Self-pay | Admitting: Cardiology

## 2020-06-05 ENCOUNTER — Other Ambulatory Visit: Payer: Self-pay

## 2020-06-05 DIAGNOSIS — E875 Hyperkalemia: Secondary | ICD-10-CM

## 2020-06-05 NOTE — Telephone Encounter (Signed)
Frank Meyer with [facility] states they are unable to see fluid/lab order. Please assist.

## 2020-06-05 NOTE — Telephone Encounter (Signed)
Reaching out to patient to offer assistance regarding upcoming cardiac imaging study; pt verbalizes understanding of appt date/time, parking situation and where to check in, pre-test NPO status and medications ordered, and verified current allergies; name and call back number provided for further questions should they arise Frank Bond RN Navigator Cardiac Imaging [facility] [phone] office [phone] cell  Pt informed of additional appt made for the infusion clinic for IV hydration.    Pt states he has plans to return to the heartcare office for a re-draw. If kidney labs improve, will cancel IV hydration.  If kidney labs remain poor, will continue with IV hydration as planned.  I requested that the patient have labs done today sooner than later so we can best coordinate his care.  Pt appreciated the phone call.  Frank Meyer

## 2020-06-05 NOTE — Telephone Encounter (Signed)
Orders obtained and placed by [name], Rn

## 2020-06-06 ENCOUNTER — Telehealth (HOSPITAL_COMMUNITY): Payer: Self-pay | Admitting: Emergency Medicine

## 2020-06-06 ENCOUNTER — Other Ambulatory Visit: Payer: Self-pay

## 2020-06-06 ENCOUNTER — Encounter (HOSPITAL_COMMUNITY): Payer: Self-pay

## 2020-06-06 ENCOUNTER — Inpatient Hospital Stay (HOSPITAL_COMMUNITY): Admission: RE | Admit: 2020-06-06 | Payer: Medicare Other | Source: Ambulatory Visit

## 2020-06-06 ENCOUNTER — Ambulatory Visit (HOSPITAL_COMMUNITY)
Admission: RE | Admit: 2020-06-06 | Discharge: 2020-06-06 | Disposition: A | Discharge status: Home / Self Care | Payer: Medicare Other | Source: Ambulatory Visit | Attending: Cardiology | Admitting: Cardiology

## 2020-06-06 DIAGNOSIS — R0600 Dyspnea, unspecified: Secondary | ICD-10-CM | POA: Insufficient documentation

## 2020-06-06 DIAGNOSIS — I2510 Atherosclerotic heart disease of native coronary artery without angina pectoris: Secondary | ICD-10-CM | POA: Insufficient documentation

## 2020-06-06 DIAGNOSIS — R0609 Other forms of dyspnea: Secondary | ICD-10-CM

## 2020-06-06 LAB — BASIC METABOLIC PANEL
BUN/Creatinine Ratio: 13 (ref 10–24)
BUN: 15 mg/dL (ref 8–27)
CO2: 25 mmol/L (ref 20–29)
Calcium: 8.9 mg/dL (ref 8.6–10.2)
Chloride: 97 mmol/L (ref 96–106)
Creatinine, Ser: 1.12 mg/dL (ref 0.76–1.27)
GFR calc Af Amer: 72 mL/min/{1.73_m2} (ref >59)
GFR calc non Af Amer: 62 mL/min/{1.73_m2} (ref >59)
Glucose: 80 mg/dL (ref 65–99)
Potassium: 5 mmol/L (ref 3.5–5.2)
Sodium: 136 mmol/L (ref 134–144)

## 2020-06-06 MED ORDER — NITROGLYCERIN 0.4 MG SL SUBL
SUBLINGUAL_TABLET | SUBLINGUAL | Status: AC
  Filled 2020-06-06: qty 2

## 2020-06-06 MED ORDER — IOHEXOL 350 MG/ML SOLN
80.0000 mL | Freq: Once | INTRAVENOUS | Status: AC | PRN
  Administered 2020-06-06: 80 mL via INTRAVENOUS

## 2020-06-06 MED ORDER — NITROGLYCERIN 0.4 MG SL SUBL: 0.8000 mg | SUBLINGUAL_TABLET | Freq: Once | SUBLINGUAL | Status: DC

## 2020-06-06 NOTE — Telephone Encounter (Signed)
Phone call to patient to inform him that recent labs for kidney function improved and he does NOT need IV hydration for CCTA today.   Ebony Hail in infusion clinic aware and will cancel his appt.  Pt instructed to arrive at [facility] Radiology at 1:15pm   Pt appreciated the call.  [name] RN Navigator Cardiac Imaging [facility] Services [phone] Office  [phone] Cell

## 2020-06-07 ENCOUNTER — Ambulatory Visit (HOSPITAL_COMMUNITY)
Admission: RE | Admit: 2020-06-07 | Discharge: 2020-06-07 | Disposition: A | Discharge status: Home / Self Care | Payer: Medicare Other | Source: Ambulatory Visit | Attending: Cardiology | Admitting: Cardiology

## 2020-06-07 DIAGNOSIS — I2510 Atherosclerotic heart disease of native coronary artery without angina pectoris: Secondary | ICD-10-CM

## 2020-06-07 DIAGNOSIS — R0609 Other forms of dyspnea: Secondary | ICD-10-CM

## 2020-06-07 DIAGNOSIS — R0600 Dyspnea, unspecified: Secondary | ICD-10-CM | POA: Insufficient documentation

## 2020-06-11 ENCOUNTER — Telehealth: Payer: Self-pay

## 2020-06-11 ENCOUNTER — Ambulatory Visit: Payer: Medicare Other | Admitting: Cardiology

## 2020-06-11 DIAGNOSIS — G459 Transient cerebral ischemic attack, unspecified: Secondary | ICD-10-CM | POA: Insufficient documentation

## 2020-06-11 DIAGNOSIS — R413 Other amnesia: Secondary | ICD-10-CM | POA: Insufficient documentation

## 2020-06-11 DIAGNOSIS — K219 Gastro-esophageal reflux disease without esophagitis: Secondary | ICD-10-CM | POA: Insufficient documentation

## 2020-06-11 DIAGNOSIS — K449 Diaphragmatic hernia without obstruction or gangrene: Secondary | ICD-10-CM | POA: Insufficient documentation

## 2020-06-11 DIAGNOSIS — I272 Pulmonary hypertension, unspecified: Secondary | ICD-10-CM | POA: Insufficient documentation

## 2020-06-11 DIAGNOSIS — K829 Disease of gallbladder, unspecified: Secondary | ICD-10-CM | POA: Insufficient documentation

## 2020-06-11 DIAGNOSIS — C4491 Basal cell carcinoma of skin, unspecified: Secondary | ICD-10-CM | POA: Insufficient documentation

## 2020-06-11 NOTE — Telephone Encounter (Signed)
Spoke with patient regarding results and recommendation.  Patient verbalizes understanding and is agreeable to plan of care. Advised patient to call back with any issues or concerns.  

## 2020-06-11 NOTE — Telephone Encounter (Signed)
Patient returning call, connected call to [facility]

## 2020-06-11 NOTE — Telephone Encounter (Signed)
-----   Message from Jenean Lindau, MD sent at 06/07/2020 11:11 AM EDT ----- The results of the study is unremarkable. Please inform patient. I will discuss in detail at next appointment. Cc  primary care/referring physician Jenean Lindau, MD 06/07/2020 11:11 AM

## 2020-06-11 NOTE — Telephone Encounter (Signed)
Left message on patients voicemail to please return our call.   

## 2020-06-12 ENCOUNTER — Other Ambulatory Visit: Payer: Self-pay

## 2020-06-12 MED ORDER — NITROGLYCERIN 0.4 MG SL SUBL: 0.4000 mg | SUBLINGUAL_TABLET | SUBLINGUAL | 6 refills | Status: DC | PRN

## 2020-06-12 NOTE — Addendum Note (Signed)
Addended by: Truddie Hidden on: 06/12/2020 01:01 PM   Modules accepted: Orders

## 2020-06-14 ENCOUNTER — Other Ambulatory Visit: Payer: Self-pay

## 2020-06-14 ENCOUNTER — Telehealth: Payer: Self-pay | Admitting: Pulmonary Disease

## 2020-06-14 ENCOUNTER — Ambulatory Visit: Payer: Medicare Other | Admitting: Cardiology

## 2020-06-14 VITALS — BP 108/66 | HR 70 | Ht 66.0 in | Wt 164.0 lb

## 2020-06-14 DIAGNOSIS — Z87891 Personal history of nicotine dependence: Secondary | ICD-10-CM | POA: Diagnosis not present

## 2020-06-14 DIAGNOSIS — I272 Pulmonary hypertension, unspecified: Secondary | ICD-10-CM

## 2020-06-14 DIAGNOSIS — I2510 Atherosclerotic heart disease of native coronary artery without angina pectoris: Secondary | ICD-10-CM

## 2020-06-14 DIAGNOSIS — R001 Bradycardia, unspecified: Secondary | ICD-10-CM

## 2020-06-14 DIAGNOSIS — E785 Hyperlipidemia, unspecified: Secondary | ICD-10-CM | POA: Diagnosis not present

## 2020-06-14 LAB — HEPATIC FUNCTION PANEL
ALT: 20 IU/L (ref 0–44)
AST: 25 IU/L (ref 0–40)
Albumin: 3.7 g/dL (ref 3.7–4.7)
Alkaline Phosphatase: 71 IU/L (ref 44–121)
Bilirubin Total: 0.9 mg/dL (ref 0.0–1.2)
Bilirubin, Direct: 0.26 mg/dL (ref 0.00–0.40)
Total Protein: 6.8 g/dL (ref 6.0–8.5)

## 2020-06-14 LAB — LIPID PANEL
Chol/HDL Ratio: 2.6 ratio (ref 0.0–5.0)
Cholesterol, Total: 147 mg/dL (ref 100–199)
HDL: 57 mg/dL (ref >39)
LDL Chol Calc (NIH): 67 mg/dL (ref 0–99)
Triglycerides: 129 mg/dL (ref 0–149)
VLDL Cholesterol Cal: 23 mg/dL (ref 5–40)

## 2020-06-14 LAB — BASIC METABOLIC PANEL
BUN/Creatinine Ratio: 12 (ref 10–24)
BUN: 15 mg/dL (ref 8–27)
CO2: 26 mmol/L (ref 20–29)
Calcium: 8.9 mg/dL (ref 8.6–10.2)
Chloride: 100 mmol/L (ref 96–106)
Creatinine, Ser: 1.24 mg/dL (ref 0.76–1.27)
GFR calc Af Amer: 64 mL/min/{1.73_m2} (ref >59)
GFR calc non Af Amer: 55 mL/min/{1.73_m2} — ABNORMAL LOW (ref >59)
Glucose: 90 mg/dL (ref 65–99)
Potassium: 4.7 mmol/L (ref 3.5–5.2)
Sodium: 137 mmol/L (ref 134–144)

## 2020-06-14 NOTE — Telephone Encounter (Signed)
Pt has not been seen at the office in over a year. Pt needs an appt.   Called and spoke with pt who stated his breathing started becoming worse 2 months ago and states that it is gradually becoming worse.  Pt denies any complaints of cough, wheezing, or chest tightness.  Pt states that he is able to walk a mile but has to stop to get his breath if he has to go up an incline.  Stated to pt that we need to get an appt scheduled to reevaluate and he verbalized understanding. appt has been scheduled for pt Tues. 11/2 with TP. Nothing further needed.

## 2020-06-14 NOTE — Progress Notes (Signed)
Cardiology Office Note:    Date:  06/14/2020   ID:  Frank Meyer, DOB [birth year], MRN [id number]  PCP:  Burnard Bunting, MD  Cardiologist:  Jenne Campus, MD    Referring MD: Burnard Bunting, MD   No chief complaint on file. Getting worse with more shortness of breath  History of Present Illness:    Frank Meyer is a 79 y.o. male with past medical history significant for smoking, dyslipidemia, bradycardia, he was noted to have calcification of the coronary arteries, coronary CT angio done after that showed less than 50% lesions, his coronary calcium score was more than 400, fractional flow reserve showed nonobstructive lesions.  His echocardiogram showed also presence of pulmonary hypertension neighborhood of 45 mmHg.  His symptoms gradually worsened he is having more shortness of breath than before.  He was able to walk multiple miles every day but now he has to slow down.  Some does have to stop and catch his breath.  We try to figure out what the theology of this phenomenon is.  Past Medical History:  Diagnosis Date  . Basal cell carcinoma   . Benign prostatic hypertrophy   . Bradycardia 12/06/2019  . Coronary artery calcification seen on CAT scan 05/28/2020  . Dyslipidemia   . Dyspnea on exertion 03/18/2018  . Emphysema lung ([place]) 03/18/2018  . Exudative age-related macular degeneration of left eye with active choroidal neovascularization ([place]) 12/18/2019  . Gallbladder problem   . Gastroesophageal reflux disease   . Hiatal hernia   . Hilar adenopathy 03/25/2018  . History of smoking 03/18/2018  . 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 Macular pucker, right eye 04/30/2020  . Mediastinal adenopathy 03/25/2018  . Memory loss 05/09/2013  . Mild memory disturbance   . Nuclear sclerotic cataract of right eye 12/18/2019  . Posterior vitreous detachment of right eye 12/18/2019  . 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  . Pulmonary hypertension ([place])   . Right upper lobe pulmonary nodule 03/25/2018  .  Syncope and collapse 12/12/2018  . TIA (transient ischemic attack)     Past Surgical History:  Procedure Laterality Date  . none      Current Medications: Current Meds  Medication Sig  . aspirin 81 MG tablet Take 81 mg by mouth daily.   Marland Kitchen atorvastatin (LIPITOR) 20 MG tablet Take 20 mg by mouth daily.  . cholecalciferol (VITAMIN D) 1000 UNITS tablet Take 1,000 Units by mouth daily.  Marland Kitchen donepezil (ARICEPT) 10 MG tablet TAKE 1 TABLET BY MOUTH  DAILY  . Fexofenadine-Pseudoephedrine (ALLEGRA-D 24 HOUR PO) Take 1 tablet by mouth daily.  . finasteride (PROSCAR) 5 MG tablet Take 5 mg by mouth daily.  Marland Kitchen ipratropium (ATROVENT) 0.06 % nasal spray Place 2 sprays into both nostrils daily as needed for rhinitis.   . Melatonin 10 MG TABS Take 10 mg by mouth at bedtime.  . Multiple Vitamins-Minerals (MULTIVITAMIN ADULTS 50+) TABS Take 1 tablet by mouth daily.   Marland Kitchen omeprazole (PRILOSEC) 40 MG capsule Take 40 mg by mouth daily.   Marland Kitchen thiamine 100 MG tablet Take 1 tablet (100 mg total) by mouth daily.  . traZODone (DESYREL) 100 MG tablet TAKE 2 TABLETS BY MOUTH AT  BEDTIME     Allergies:   Patient has no known allergies.   Social History   Socioeconomic History  . Marital status: Married    Spouse name: Frank Meyer  . Number of children: 2  . Years of education: college  . Highest education level: Not on file  Occupational History  . Occupation: retired  Tobacco Use  . Smoking status: Former Smoker    Quit date: 05/04/1979    Years since quitting: 41.1  . Smokeless tobacco: Never Used  Substance and Sexual Activity  . Alcohol use: Yes    Alcohol/week: 15.0 - 20.0 standard drinks    Types: 15 - 20 Cans of beer per week  . Drug use: Never  . Sexual activity: Not on file  Other Topics Concern  . Not on file  Social History Narrative   Pt is retired. He is married. The pt lives with his wife. He has a Best boy. The pt has 2 children.      Patient drinks 2-3 cups of caffeine daily.    Patient is right handed.    Social Determinants of Health   Financial Resource Strain:   . Difficulty of Paying Living Expenses: Not on file  Food Insecurity:   . Worried About Charity fundraiser in the Last Year: Not on file  . Ran Out of Food in the Last Year: Not on file  Transportation Needs:   . Lack of Transportation (Medical): Not on file  . Lack of Transportation (Non-Medical): Not on file  Physical Activity:   . Days of Exercise per Week: Not on file  . Minutes of Exercise per Session: Not on file  Stress:   . Feeling of Stress : Not on file  Social Connections:   . Frequency of Communication with Friends and Family: Not on file  . Frequency of Social Gatherings with Friends and Family: Not on file  . Attends Religious Services: Not on file  . Active Member of Clubs or Organizations: Not on file  . Attends Archivist Meetings: Not on file  . Marital Status: Not on file     Family History: The patient's family history includes Memory loss in his mother; Obesity in his mother. ROS:   Please see the history of present illness.    All 14 point review of systems negative except as described per history of present illness  EKGs/Labs/Other Studies Reviewed:      Recent Labs: 06/13/2020: ALT 20; BUN 15; Creatinine, Ser 1.24; Potassium 4.7; Sodium 137  Recent Lipid Panel    Component Value Date/Time   CHOL 147 06/13/2020 0819   TRIG 129 06/13/2020 0819   HDL 57 06/13/2020 0819   CHOLHDL 2.6 06/13/2020 0819   LDLCALC 67 06/13/2020 0819    Physical Exam:    VS:  BP 108/66   Pulse 70   Ht 5\' 6"  (1.676 m)   Wt 164 lb (74.4 kg)   SpO2 92%   BMI 26.47 kg/m     Wt Readings from Last 3 Encounters:  06/14/20 164 lb (74.4 kg)  05/29/20 164 lb 8 oz (74.6 kg)  05/28/20 163 lb 0.6 oz (74 kg)     GEN:  Well nourished, well developed in no acute distress HEENT: Normal NECK: No JVD; No carotid bruits LYMPHATICS: No lymphadenopathy CARDIAC: RRR, no  murmurs, no rubs, no gallops RESPIRATORY:  Clear to auscultation without rales, wheezing or rhonchi  ABDOMEN: Soft, non-tender, non-distended MUSCULOSKELETAL:  No edema; No deformity  SKIN: Warm and dry LOWER EXTREMITIES: no swelling NEUROLOGIC:  Alert and oriented x 3 PSYCHIATRIC:  Normal affect   ASSESSMENT:    1. Pulmonary hypertension (HCC)   2. Coronary artery calcification seen on CAT scan   3. History of smoking   4. Dyslipidemia  5. Bradycardia    PLAN:    In order of problems listed above:  1. Dyspnea on exertion probably multifactorial.  He is ejection fraction is normal, his coronary arteries showed nonobstructive lesions based on coronary CT angio and FFR, he does have pulmonary hypertension only 45 mmHg.  I do not think it sufficient to justify his symptomatology.  On the CAT scan we did see however significant emphysema.  I I think he need to follow-up with pulmonary to reassess that he may require pulmonary function test as well.  If that work-up will be unrevealing then will push more so on investigation of his pulmonary hypertension.  I think this is most likely class III related to his lung condition.  He did see pulmonary before and ask him to call them back so he can be seen again. 2. History of smoking does not smoke anymore 3. Dyslipidemia: He is taking statin which I will continue.  I did review his K PN showing me his LDL of 60 and HDL of 58 this is from March 26, 2020. 4. Bradycardia: Denies having any.  Today on the EKG he is actually normal sinus rhythm 70 bpm.   Medication Adjustments/Labs and Tests Ordered: Current medicines are reviewed at length with the patient today.  Concerns regarding medicines are outlined above.  Orders Placed This Encounter  Procedures  . EKG 12-Lead   Medication changes: No orders of the defined types were placed in this encounter.   Signed, Park Liter, MD, [facility] 06/14/2020 1:26 PM    [place]

## 2020-06-14 NOTE — Patient Instructions (Signed)

## 2020-06-18 ENCOUNTER — Ambulatory Visit: Payer: Medicare Other | Admitting: Adult Health

## 2020-06-19 ENCOUNTER — Encounter: Payer: Self-pay | Admitting: Acute Care

## 2020-06-19 ENCOUNTER — Ambulatory Visit: Payer: Medicare Other | Admitting: Acute Care

## 2020-06-19 ENCOUNTER — Other Ambulatory Visit: Payer: Self-pay

## 2020-06-19 VITALS — BP 110/70 | HR 73 | Temp 97.3°F | Ht 66.0 in | Wt 165.4 lb

## 2020-06-19 DIAGNOSIS — R0609 Other forms of dyspnea: Secondary | ICD-10-CM

## 2020-06-19 DIAGNOSIS — R0600 Dyspnea, unspecified: Secondary | ICD-10-CM | POA: Diagnosis not present

## 2020-06-19 DIAGNOSIS — Z87891 Personal history of nicotine dependence: Secondary | ICD-10-CM | POA: Diagnosis not present

## 2020-06-19 DIAGNOSIS — R0602 Shortness of breath: Secondary | ICD-10-CM | POA: Diagnosis not present

## 2020-06-19 DIAGNOSIS — R0902 Hypoxemia: Secondary | ICD-10-CM | POA: Diagnosis not present

## 2020-06-19 NOTE — Progress Notes (Signed)
History of Present Illness Frank Meyer is a 79 y.o. male former heavy  smoker with dyspnea on exertion and right upper lobe consolidative lesion with borderline enlarged mediastinal and right hilar lymph nodes on PET imaging. He is followed by Dr. Valeta Harms..  Pt. Is not on maintenance medications: offered and therapeutic trial done, did not feel cost was worth any benefit.  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 No obstruction on PFT's but very significant disease on CAT scan imaging.Echocardiogram  reveals evidence of pulmonary hypertension most likely  related to his underlying lung disease.    06/19/2020  Pt. Presents for follow up of dyspnea with exertion. He has not been seen in the office for> 1 year. He states he has had worsening shortness of breath over the last 2 months. He had a cards work up for chest pain which included a coronary CT, which showed no evidence for hemodynamically significant coronary disease.  It is being treated as Nonobstructive disease. LFT's are being checked  For initiation  statin therapy, and primary care  Is starting  coated baby aspirin on a daily basis and sublingual nitroglycerin as needed. They then sent him here for further work up.   From a pulmonary perspective, patient states the last 2 months he has had worsening dyspnea with exertion. He states this is worse with slight incline, and when going up stairs. He does not have an oxygen saturation monitor, so he is not sure if his saturations drop below 88%.   We walked him in the office today. His saturations dropped to 79% on RA on a flat surface at a brisk rate. He and his wife were both shocked. We then walked him on the Inogen device, and he was able to [facility] his oxygen sats at > 90% on 5 L of pulsed oxygen.  He was very upset at the thought of  having to wear oxygen when he is exerting himself. We discussed that not treating low oxygen can interfere with heart and brain function. 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   Pt. States he is coughing up some secretions and that they are clear.   We will order imaging and PFT's to determine the cause of the patient's hypoxemia. He does have a history of what had been stable fibrosis. I did hear crackles in his right mid lung .  I will also check a CBC to see if there may be an element of anemia involved.    Test Results: Chest Imaging: CT imaging with right upper lobe lesion with associated right hilar and mediastinal adenopathy.  He does have lung nodules within the right lower lobe and left upper lobe.  He has significant centrilobular and paraseptal emphysema.  CT Chest  01/2019 Stable pleural-parenchymal confluent opacities in the right apex. This can be followed utilizing the protocol described for aneurysm monitoring.  Stable mediastinal lymph nodes and left apical parenchymal nodule.  Stable chronic fibrotic changes and emphysematous change.   PET Imaging: 03/2018 IMPRESSION: 1. Low level FDG accumulation in the borderline enlarged mediastinal and right hilar lymph nodes. Nonspecific. 2. Tiny bilateral pulmonary nodules have decreased in the interval. 3. New areas of ground-glass attenuation in both lower lobes showing associated FDG uptake. Disease in the medial left lower lobe is most hypermetabolic were there is an area of somewhat more  focal ground-glass density. The ground-glass attenuation is new and or progressive in the interval. Changes may well be infectious/inflammatory, but close follow-up recommended to exclude neoplasm. 4. Emphysema. (ICD10-J43.9) 5. Aortic Atherosclerois (ICD10-170.0)    Pulmonary Functions Testing Results: 04/27/2018: FEV1 3.2 L, 130% predicted FVC 4.07 L, 118% predicted, postbronchodilator Ratio 78 RV/TLC 61% predicted,  DLCO 44% predicted.  Echo 03/23/2018 Study Conclusions - Left ventricle: The cavity size was normal. Systolic function was normal. The estimated ejection fraction was in the range of 55% to 60%. Wall motion was normal; there were no regional wall motion abnormalities. Doppler parameters are consistent with abnormal left ventricular relaxation (grade 1 diastolic dysfunction). - Aortic valve: There was trivial regurgitation. - Mitral valve: There was mild regurgitation. - Pulmonary arteries: PA peak pressure: 31 mm Hg (S). Impressions: - Normal LVEF. Ascending aorte - 4.0 cm Trace AR.  03/29/2018- Echo Stress - Stress ECG conclusions: There were no stress arrhythmias or conduction abnormalities. The stress ECG was normal. - Staged echo: There was no echocardiographic evidence for stress-induced ischemia. - Immediate post stress: RV size was moderately enlarged. RV global systolic function was mildly to moderately depressed by visual assessment with a D shaped septum. There was no TR to estimate RV pressure. - On review of his TTE last week the RV is dilated.        CBC Latest Ref Rng & Units 12/12/2018 12/12/2018 03/25/2018  WBC 4.0 - 10.5 K/uL 7.4 7.2 12.3(H)  Hemoglobin 13.0 - 17.0 g/dL 13.7 13.6 14.3  Hematocrit 39 - 52 % 42.0 42.5 44.0  Platelets 150 - 400 K/uL 202 209 268.0    BMP Latest Ref Rng & Units 06/13/2020 06/05/2020 05/30/2020  Glucose 65 - 99 mg/dL 90 80 90  BUN 8 - 27 mg/dL 15 15 23   Creatinine 0.76 - 1.27 mg/dL 1.24 1.12 1.52(H)  BUN/Creat Ratio 10 - 24 12 13 15   Sodium 134 - 144 mmol/L 137 136 136  Potassium 3.5 - 5.2 mmol/L 4.7 5.0 5.0  Chloride 96 - 106 mmol/L 100 97 100  CO2 20 - 29 mmol/L 26 25 25   Calcium 8.6 - 10.2 mg/dL 8.9 8.9 8.6    BNP No results found for: BNP  ProBNP No results found for: PROBNP  PFT    Component Value Date/Time   FEV1PRE 3.23 04/27/2018 0848   FEV1POST 3.20 04/27/2018 0848   FVCPRE 4.11  04/27/2018 0848   FVCPOST 4.07 04/27/2018 0848   TLC 5.24 04/27/2018 0848   DLCOUNC 11.81 04/27/2018 0848   PREFEV1FVCRT 79 04/27/2018 0848   PSTFEV1FVCRT 78 04/27/2018 0848    CT CORONARY MORPH W/CTA COR W/SCORE W/CA W/CM &/OR WO/CM  Addendum Date: 06/06/2020   ADDENDUM REPORT: 06/06/2020 22:47 CLINICAL DATA:  Chest pain EXAM: Cardiac CTA MEDICATIONS: Sub lingual nitro. 4mg  x 2 TECHNIQUE: The patient was scanned on a Siemens 962 slice scanner. Gantry rotation speed was 250 msecs. Collimation was 0.6 mm. A 100 kV prospective scan was triggered in the ascending thoracic aorta at 35-75% of the R-R interval. Average HR during the scan was 60 bpm. The 3D data set was interpreted on a dedicated work station using MPR, MIP and VRT modes. A total of 80cc of contrast was used. FINDINGS: Non-cardiac: See separate report from [facility]. Pulmonary veins drain normally to the left atrium. Calcium Score: 345 Agatston units. Coronary Arteries: Right dominant with no anomalies LM: Mixed plaque distal left main, mild (<50%) stenosis. LAD system: Mixed plaque proximal LAD.  Suspect  about 50% stenosis. Circumflex system: Small ramus, no significant disease. No plaque or stenosis in the LCx system. RCA system: Mixed plaque proximal and mid RCA, mild (<50%) stenosis. IMPRESSION: 1. Coronary artery calcium score 345 Agatston units. This places the patient in the 49th percentile for age and gender, suggesting intermediate risk for future cardiac events. 2. Suspect no hemodynamically significant disease, worst area appears to be 50% mid LAD stenosis. Will send for FFR to confirm. Dalton Mclean Electronically Signed   By: Loralie Champagne M.D.   On: 06/06/2020 22:47   Result Date: 06/06/2020 EXAM: OVER-READ INTERPRETATION  CT CHEST The following report is an over-read performed by radiologist Dr. Vinnie Langton of [facility], [place] on 06/06/2020. This over-read does not include interpretation of cardiac or  coronary anatomy or pathology. The coronary calcium score/coronary CTA interpretation by the cardiologist is attached. COMPARISON:  None. FINDINGS: Aortic atherosclerosis. Extensive emphysematous changes in the lungs bilaterally. Within the visualized portions of the thorax there are no suspicious appearing pulmonary nodules or masses, there is no acute consolidative airspace disease, no pleural effusions, no pneumothorax and no lymphadenopathy. Visualized portions of the upper abdomen are unremarkable. There are no aggressive appearing lytic or blastic lesions noted in the visualized portions of the skeleton. IMPRESSION: 1.  Aortic Atherosclerosis (ICD10-I70.0). 2.  Emphysema (ICD10-J43.9). Electronically Signed: By: Vinnie Langton M.D. On: 06/06/2020 15:28   CT CORONARY FRACTIONAL FLOW RESERVE DATA PREP  Result Date: 06/07/2020 CLINICAL DATA:  Chest pain EXAM: CT FFR MEDICATIONS: No additional medications. TECHNIQUE: The coronary CT was sent for FFR FINDINGS: 0.95 distal RCA 0.93 mid LAD 0.93 mid D1 IMPRESSION: No evidence for hemodynamically significant coronary disease. Dalton Mclean Electronically Signed   By: Loralie Champagne M.D.   On: 06/07/2020 16:48     Past medical hx Past Medical History:  Diagnosis Date  . Basal cell carcinoma   . Benign prostatic hypertrophy   . Bradycardia 12/06/2019  . Coronary artery calcification seen on CAT scan 05/28/2020  . Dyslipidemia   . Dyspnea on exertion 03/18/2018  . Emphysema lung ([place]) 03/18/2018  . Exudative age-related macular degeneration of left eye with active choroidal neovascularization ([place]) 12/18/2019  . Gallbladder problem   . Gastroesophageal reflux disease   . Hiatal hernia   . Hilar adenopathy 03/25/2018  . History of smoking 03/18/2018  . 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 Macular pucker, right eye 04/30/2020  . Mediastinal adenopathy 03/25/2018  . Memory loss 05/09/2013  . Mild memory disturbance   . Nuclear sclerotic cataract of right eye 12/18/2019  .  Posterior vitreous detachment of right eye 12/18/2019  . 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  . Pulmonary hypertension ([place])   . Right upper lobe pulmonary nodule 03/25/2018  . Syncope and collapse 12/12/2018  . TIA (transient ischemic attack)      Social History   Tobacco Use  . Smoking status: Former Smoker    Quit date: 05/04/1979    Years since quitting: 41.1  . Smokeless tobacco: Never Used  Substance Use Topics  . Alcohol use: Yes    Alcohol/week: 15.0 - 20.0 standard drinks    Types: 15 - 20 Cans of beer per week  . Drug use: Never    Mr.Bueche reports that he quit smoking about 41 years ago. He has never used smokeless tobacco. He reports current alcohol use of about 15.0 - 20.0 standard drinks of alcohol per week. He reports that he does not use drugs.  Tobacco Cessation: Former smoker ,  Quit 1980  Past surgical hx, Family hx, Social hx all reviewed.  Current Outpatient Medications on File Prior to Visit  Medication Sig  . aspirin 81 MG tablet Take 81 mg by mouth daily.   Marland Kitchen atorvastatin (LIPITOR) 20 MG tablet Take 20 mg by mouth daily.  . cholecalciferol (VITAMIN D) 1000 UNITS tablet Take 1,000 Units by mouth daily.  Marland Kitchen donepezil (ARICEPT) 10 MG tablet TAKE 1 TABLET BY MOUTH  DAILY  . Fexofenadine-Pseudoephedrine (ALLEGRA-D 24 HOUR PO) Take 1 tablet by mouth daily.  . finasteride (PROSCAR) 5 MG tablet Take 5 mg by mouth daily.  Marland Kitchen ipratropium (ATROVENT) 0.06 % nasal spray Place 2 sprays into both nostrils daily as needed for rhinitis.   . Melatonin 10 MG TABS Take 10 mg by mouth at bedtime.  . Multiple Vitamins-Minerals (MULTIVITAMIN ADULTS 50+) TABS Take 1 tablet by mouth daily.   . nitroGLYCERIN (NITROSTAT) 0.4 MG SL tablet Place 1 tablet (0.4 mg total) under the tongue every 5 (five) minutes as needed.  Marland Kitchen omeprazole (PRILOSEC) 40 MG capsule Take 40 mg by mouth daily.   Marland Kitchen thiamine 100 MG tablet Take 1 tablet (100 mg total) by mouth daily.  . traZODone (DESYREL) 100 MG  tablet TAKE 2 TABLETS BY MOUTH AT  BEDTIME   No current facility-administered medications on file prior to visit.     No Known Allergies  Review Of Systems:  Constitutional:   No  weight loss, night sweats,  Fevers, chills, fatigue, or  lassitude.  HEENT:   No headaches,  Difficulty swallowing,  Tooth/dental problems, or  Sore throat,                No sneezing, itching, ear ache, nasal congestion, post nasal drip,   CV:  No chest pain,  Orthopnea, PND, swelling in lower extremities, anasarca, dizziness, palpitations, syncope.   GI  No heartburn, indigestion, abdominal pain, nausea, vomiting, diarrhea, change in bowel habits, loss of appetite, bloody stools.   Resp: No shortness of breath with exertion or at rest.  No excess mucus, no productive cough,  No non-productive cough,  No coughing up of blood.  No change in color of mucus.  No wheezing.  No chest wall deformity  Skin: no rash or lesions.  GU: no dysuria, change in color of urine, no urgency or frequency.  No flank pain, no hematuria   MS:  No joint pain or swelling.  No decreased range of motion.  No back pain.  Psych:  No change in mood or affect. No depression or anxiety.  No memory loss.   Vital Signs BP 110/70 (BP Location: Left Arm, Cuff Size: Normal)   Pulse 73   Temp (!) 97.3 F (36.3 C) (Oral)   Ht 5\' 6"  (1.676 m)   Wt 165 lb 6.4 oz (75 kg)   SpO2 92%   BMI 26.70 kg/m    Physical Exam:  General- No distress,  A&Ox3 ENT: No sinus tenderness, TM clear, pale nasal mucosa, no oral exudate,no post nasal drip, no LAN Cardiac: S1, S2, regular rate and rhythm, no murmur Chest: No wheeze/ rales/ dullness; no accessory muscle use, no nasal flaring, no sternal retractions Abd.: Soft Non-tender Ext: No clubbing cyanosis, edema Neuro:  normal strength Skin: No rashes, warm and dry Psych: normal mood and behavior   Assessment/Plan Dyspnea on exertion Has refused inhalers up to this point PFT's do not show  obstruction Elevated PASP on Echo Desats to 79% walking today on RA  Plan We will walk you today.  You qualify for oxygen as your sats dropped to 79% on RA, flat surface We will order oxygen for you.  ( Inogen Oxygen tank)  Wear this at 5 L with exertional activity Goal is to keep oxygen saturations > 88%. If you cannot get this within the next few days, let us know so we can order more readily available tanks.  We will order a CBC to ensure you are not anemic.  Please look into purchasing an oxygen saturation monitor.   We will do a HRCT to evaluate for worsening pulmonary fibrosis/ emphysema We will schedule you for PFT's You will get a call to get this scheduled.  Follow up after testing has been completed to evaluate. Follow up with Judson Roch NP or Dr. Valeta Harms.   Please contact office for sooner follow up if symptoms do not improve or worsen or seek emergency care     Abnormal imaging of the lung Stable fibrotic changes noted 01/2019 Plan Will need surveillance imaging for stability  Plan HRCT chest   Addendum I have called the patient and asked him to come in for a CBC so that we can ensure he is not anemic. He has recently been started on baby aspirin once daily. He will come to the office tomorrow for the lab. The order has been placed.   This appointment was over 50  minutes long with over 50% of the time being direct face to face patient care, assessment , plan of care , and follow up.   Magdalen Spatz, NP 06/19/2020  5:28 PM

## 2020-06-19 NOTE — Patient Instructions (Addendum)
It is good to see you today. We will walk you today.  You qualify for oxygen as your sats dropped to 79% on RA We will order oxygen for you.  ( Inogen Oxygen tank)  Wear this at 5 L with exertional activity Goal is to keep oxygen saturations > 88%. Please look into purchasing an oxygen saturation monitor.   We will do a HRCT to evaluate for worsening pulmonary fibrosis/ emphysema We will schedule you for PFT's You will get a call to get this scheduled.  Follow up after testing has been completed to evaluate. Follow up with Judson Roch NP or Dr. Valeta Harms.   Please contact office for sooner follow up if symptoms do not improve or worsen or seek emergency care

## 2020-06-20 ENCOUNTER — Other Ambulatory Visit (INDEPENDENT_AMBULATORY_CARE_PROVIDER_SITE_OTHER): Payer: Medicare Other

## 2020-06-20 DIAGNOSIS — R0902 Hypoxemia: Secondary | ICD-10-CM | POA: Diagnosis not present

## 2020-06-20 DIAGNOSIS — R0609 Other forms of dyspnea: Secondary | ICD-10-CM

## 2020-06-20 DIAGNOSIS — R0600 Dyspnea, unspecified: Secondary | ICD-10-CM | POA: Diagnosis not present

## 2020-06-20 LAB — CBC WITH DIFFERENTIAL/PLATELET
Basophils Absolute: 0.1 10*3/uL (ref 0.0–0.1)
Basophils Relative: 0.9 % (ref 0.0–3.0)
Eosinophils Absolute: 0.4 10*3/uL (ref 0.0–0.7)
Eosinophils Relative: 3.9 % (ref 0.0–5.0)
HCT: 43.1 % (ref 39.0–52.0)
Hemoglobin: 14 g/dL (ref 13.0–17.0)
Lymphocytes Relative: 23.2 % (ref 12.0–46.0)
Lymphs Abs: 2.5 10*3/uL (ref 0.7–4.0)
MCHC: 32.6 g/dL (ref 30.0–36.0)
MCV: 84.8 fl (ref 78.0–100.0)
Monocytes Absolute: 1 10*3/uL (ref 0.1–1.0)
Monocytes Relative: 9.6 % (ref 3.0–12.0)
Neutro Abs: 6.6 10*3/uL (ref 1.4–7.7)
Neutrophils Relative %: 62.4 % (ref 43.0–77.0)
Platelets: 245 10*3/uL (ref 150.0–400.0)
RBC: 5.08 Mil/uL (ref 4.22–5.81)
RDW: 14.3 % (ref 11.5–15.5)
WBC: 10.6 10*3/uL — ABNORMAL HIGH (ref 4.0–10.5)

## 2020-06-20 NOTE — Progress Notes (Signed)
PCCM:  Thanks for seeing him. Agree. With imaging and O2 orders  Garner Nash, DO [facility] 06/20/2020 9:06 AM

## 2020-06-25 ENCOUNTER — Ambulatory Visit (HOSPITAL_BASED_OUTPATIENT_CLINIC_OR_DEPARTMENT_OTHER)
Admission: RE | Admit: 2020-06-25 | Discharge: 2020-06-25 | Disposition: A | Discharge status: Home / Self Care | Payer: Medicare Other | Source: Ambulatory Visit | Attending: Acute Care | Admitting: Acute Care

## 2020-06-25 ENCOUNTER — Other Ambulatory Visit: Payer: Self-pay

## 2020-06-25 DIAGNOSIS — R0602 Shortness of breath: Secondary | ICD-10-CM | POA: Diagnosis present

## 2020-06-26 ENCOUNTER — Ambulatory Visit (INDEPENDENT_AMBULATORY_CARE_PROVIDER_SITE_OTHER): Payer: Medicare Other | Admitting: Pulmonary Disease

## 2020-06-26 ENCOUNTER — Other Ambulatory Visit: Payer: Self-pay

## 2020-06-26 DIAGNOSIS — R0609 Other forms of dyspnea: Secondary | ICD-10-CM

## 2020-06-26 DIAGNOSIS — R0602 Shortness of breath: Secondary | ICD-10-CM

## 2020-06-26 DIAGNOSIS — R0600 Dyspnea, unspecified: Secondary | ICD-10-CM | POA: Diagnosis not present

## 2020-06-26 NOTE — Progress Notes (Signed)
PFT done today. 

## 2020-06-27 ENCOUNTER — Other Ambulatory Visit: Payer: Self-pay | Admitting: Acute Care

## 2020-06-27 ENCOUNTER — Telehealth: Payer: Self-pay | Admitting: Acute Care

## 2020-06-27 ENCOUNTER — Telehealth (INDEPENDENT_AMBULATORY_CARE_PROVIDER_SITE_OTHER): Payer: Self-pay | Admitting: Acute Care

## 2020-06-27 DIAGNOSIS — R911 Solitary pulmonary nodule: Secondary | ICD-10-CM

## 2020-06-27 LAB — PULMONARY FUNCTION TEST
DL/VA % pred: 45 %
DL/VA: 1.79 ml/min/mmHg/L
DLCO cor % pred: 44 %
DLCO cor: 9.61 ml/min/mmHg
DLCO unc % pred: 44 %
DLCO unc: 9.44 ml/min/mmHg
FEF 25-75 Post: 2.89 L/sec
FEF 25-75 Pre: 2.65 L/sec
FEF2575-%Change-Post: 9 %
FEF2575-%Pred-Post: 176 %
FEF2575-%Pred-Pre: 161 %
FEV1-%Change-Post: 3 %
FEV1-%Pred-Post: 126 %
FEV1-%Pred-Pre: 122 %
FEV1-Post: 3.02 L
FEV1-Pre: 2.92 L
FEV1FVC-%Change-Post: 0 %
FEV1FVC-%Pred-Pre: 108 %
FEV6-%Change-Post: 2 %
FEV6-%Pred-Post: 123 %
FEV6-%Pred-Pre: 120 %
FEV6-Post: 3.85 L
FEV6-Pre: 3.75 L
FEV6FVC-%Pred-Post: 108 %
FEV6FVC-%Pred-Pre: 108 %
FVC-%Change-Post: 2 %
FVC-%Pred-Post: 113 %
FVC-%Pred-Pre: 111 %
FVC-Post: 3.85 L
FVC-Pre: 3.75 L
Post FEV1/FVC ratio: 78 %
Post FEV6/FVC ratio: 100 %
Pre FEV1/FVC ratio: 78 %
Pre FEV6/FVC Ratio: 100 %
RV % pred: 87 %
RV: 2.1 L
TLC % pred: 92 %
TLC: 5.75 L

## 2020-06-27 NOTE — Telephone Encounter (Signed)
Suspect this is multi-factorial dyspnea >> emphysema related, with non-specific mild smoking related pulmonary fibrosis, and PAH ( 42 mm HG) . He has most likely been hypoxic, which possibly has negatively  affected his heart. D dimer was negative on 05/28/2020.  Will continue on oxygen, sat goals of > 88%, will send back to cards to determine need for some mild diuretic.   Will need a 12 month follow up CT to re-evaluate 4 mm LUL nodule  Amy, please order 12 month follow up CT Chest without Dr. 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 Chronic mild septal thickening associated with areas of paraseptal emphysema, unchanged, compatible with nonspecific mild smoking related fibrosis. 2. No compelling evidence of interstitial lung disease. 3. New 4 mm solid anterior left upper lobe pulmonary nodule. Suggest follow-up chest CT in 12 months in this high risk patient. No follow-up needed if patient is low-risk. Non-contrast chest CT can be considered in 12 months if patient is high-risk. This recommendation follows the consensus statement: Guidelines for Management of Incidental Pulmonary Nodules Detected on CT Images: From the Fleischner Society 2017; Radiology 2017; 284:228-243. 4. 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 5. Stable mild cardiomegaly. Two-vessel coronary atherosclerosis. 6. Aortic Atherosclerosis (ICD10-I70.0) and Emphysema (ICD10-J43.9).  PFT's 06/26/2020       Echo 11/2019 Echocardiogram showed normal left ventricle ejection fraction, mildly enlarged ascending aorta mild mitral valve regurgitation, pulmonary artery pressure mildly elevated 45 mmHg,

## 2020-06-27 NOTE — Telephone Encounter (Signed)
12 month CT chest has been put in. thanks

## 2020-06-28 NOTE — Telephone Encounter (Signed)
Sarah,  I think he is seeing you next week. Maybe you can go over some of these recommendations with him from Dr. Silas Flood. I am ok if he would like to meet with Dr. Lemmie Evens and discuss his [place] as well. Thanks for helping coordinate care.  BLI

## 2020-06-28 NOTE — Telephone Encounter (Signed)
Matt, can you take a look at this guy. 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 Any thoughts? Much appreciated.  Garner Nash, DO [facility] 06/28/2020 9:54 AM

## 2020-07-01 NOTE — Telephone Encounter (Signed)
I will definitely have him follow up with Dr. Silas Flood. Thanks so much

## 2020-07-03 ENCOUNTER — Ambulatory Visit: Payer: Medicare Other | Admitting: Acute Care

## 2020-07-03 ENCOUNTER — Encounter: Payer: Self-pay | Admitting: Acute Care

## 2020-07-03 ENCOUNTER — Ambulatory Visit: Payer: Medicare Other

## 2020-07-03 ENCOUNTER — Ambulatory Visit: Payer: Medicare Other | Admitting: Cardiology

## 2020-07-03 ENCOUNTER — Other Ambulatory Visit: Payer: Self-pay | Admitting: Acute Care

## 2020-07-03 ENCOUNTER — Other Ambulatory Visit: Payer: Self-pay

## 2020-07-03 VITALS — BP 112/72 | HR 74 | Temp 97.9°F | Ht 66.0 in | Wt 164.4 lb

## 2020-07-03 DIAGNOSIS — I2724 Chronic thromboembolic pulmonary hypertension: Secondary | ICD-10-CM | POA: Diagnosis not present

## 2020-07-03 DIAGNOSIS — G4733 Obstructive sleep apnea (adult) (pediatric): Secondary | ICD-10-CM | POA: Diagnosis not present

## 2020-07-03 DIAGNOSIS — I272 Pulmonary hypertension, unspecified: Secondary | ICD-10-CM

## 2020-07-03 MED ORDER — ALBUTEROL SULFATE HFA 108 (90 BASE) MCG/ACT IN AERS: 2.0000 | INHALATION_SPRAY | Freq: Four times a day (QID) | RESPIRATORY_TRACT | 3 refills | Status: DC | PRN

## 2020-07-03 NOTE — Patient Instructions (Addendum)
It is good to see you today. We will place an order with [facility] to provide you with an [employer] portable oxygen machine.   Please have [facility] come and check the machine at your home to see why it is taking so long to fill. If they cannot provide this machine, please call the [employer] and seee if they can provide you with a portable devise. He needs 5 L pulsed oxygen with activity, and 3-4 L continuous with activity.  We will order a VQ scan to evaluate for CTEPH. We will order a Home sleep study for sleep apnea.  We will call you with results. Please wear your oxygen to maintain your sats > 88% at all times.  Wear with activity at 5 L pulsed and 3-4 L continuous.  We will send in a prescription for Albuterol inhaler. Use as needed for shortness of breath or wheezing, no more than 2-3 times daily Follow up with Dr. Silas Flood at first available ( Pt is anxious to speak with him) Please contact office for sooner follow up if symptoms do not improve or worsen or seek emergency care

## 2020-07-03 NOTE — Progress Notes (Signed)
History of Present Illness Frank Meyer is a 79 y.o. male former heavy  smoker with dyspnea on exertion and right upper lobe consolidative lesion with borderline enlarged mediastinal and right hilar lymph nodes on PET imaging. He is followed by Dr. Valeta Harms..  Pt. Is not on maintenance medications: offered and therapeutic trial done, did not feel cost was worth any benefit.  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 No obstruction on PFT's but very significant disease on CAT scan imaging.Echocardiogram  reveals evidence of pulmonary hypertension most likely  related to his underlying lung disease. He was started on oxygen 06/19/2020 after a walk in the office on a flat surface. He dropped his oxygen to 78% on RA. He was prescribed oxygen and PFT's and HRCT was ordered to evaluate for possible worsening of his fibrosis vs another possible cause. PFT's 06/2020 are relatively stable - much better than would be expected based on lung imaging. DLCO is reduced out of proportion to other parameters suggesting possible contribution to presumed pulmonary HTN outside of parenchymal lung disease. He has mild LA enlargement on serial TTEs raising possibility of group 2 disease.     07/03/2020  Follow up after initiation of oxygen for ambulatory hypoxemia.Pt was walked in the office  06/19/2020, and desaturated  To 78% on flat surface. I suspect he has been hypoxemic for awhile with exertion. He was started on oxygen 11/3.  Pt states he is using his oxygen when he goes outside, but he does not use it consistently. He is very frustrated today. The oxygen tank that was ordered is too big, and difficult to work with, so he does not use it often per his wife. The POC we have ordered is not available, so he is not able to be as active as he likes. He is very  frustrated.   We discussed that treatment with oxygen will help his PAH. He understands this. He stated that he would rather have quality of life , than quantity of life. He is contemplating not using his oxygen, and making the best of life.  We discussed the results of his HRCT and PFT's . I explained that his PFT's are relatively stable , with a significant decrease in his diffusion capacity which is disproportionate to his other parameters. Additionally his HRCT shows stable fibrosis,  Paraseptal emphysema, unchanged, and compatible with nonspecific mild smoking related fibrosis.There is no compelling evidence of ILD. I explained that these were both positives.  We discussed that the disproportionate DLCO may be related to [facility] as a result of his emphysema from smoking. We  know from echos 12/13/2019 and 11/2018 PAP has increased  To 42 mm Hg. He had no PAH in 2019.  Pt is willing to undergo  VQ scan to evaluate for  CTEPH. He is also willing to have a home sleep test to evaluate for OSA, or need for night time oxygen. He is highly motivated to get a POC so that he can wear his oxygen and remain active. We have re-sent the order for POC. He may order one from [employer] and pay out of pocket. I have told him if he does that, he must make sure it can supply 5 L pulsed and 4 continuous oxygen, as that is what he needs.  He states he is willing to do anything to not  need oxygen.  Pt. Will see Dr. Silas Flood 11/18 at 11:30 in a 15 minute slot to discuss [place], and the necessity to wear oxygen , treatment etc.    Test Results: HRCT 06/25/2020 Moderate to severe centrilobular and paraseptal emphysema. Chronic mild septal thickening associated with areas of paraseptal emphysema, unchanged, compatible with nonspecific mild smoking related fibrosis. No compelling evidence of interstitial lung disease. New 4 mm solid anterior left upper lobe pulmonary nodule. Suggest follow-up chest CT in 12 months in this high risk  patient. No follow-up needed if patient is low-risk. Non-contrast chest CT can be considered in 12 months if patient is high-risk. This recommendation follows the consensus statement: Guidelines for Management of Incidental Pulmonary Nodules Detected on CT Images: From the Fleischner Society 2017; Radiology 2017; 284:228-243. 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 Stable mild cardiomegaly. Two-vessel coronary atherosclerosis. Aortic Atherosclerosis (ICD10-I70.0) and Emphysema (ICD10-J43.9).  PFT's 06/26/2020    Chest Imaging: CT imaging with right upper lobe lesion with associated right hilar and mediastinal adenopathy. He does have lung nodules within the right lower lobe and left upper lobe. He has significant centrilobular and paraseptal emphysema.  CT Chest  01/2019 Stable pleural-parenchymal confluent opacities in the right apex. This can be followed utilizing the protocol described for aneurysm monitoring.  Stable mediastinal lymph nodes and left apical parenchymal nodule.  Stable chronic fibrotic changes and emphysematous change.   PET Imaging: 03/2018 IMPRESSION: 1. Low level FDG accumulation in the borderline enlarged mediastinal and right hilar lymph nodes. Nonspecific. 2. Tiny bilateral pulmonary nodules have decreased in the interval. 3. New areas of ground-glass attenuation in both lower lobes showing associated FDG uptake. Disease in the medial left lower lobe is most hypermetabolic were there is an area of somewhat more focal ground-glass density. The ground-glass attenuation is new and or progressive in the interval. Changes may well be infectious/inflammatory, but close follow-up recommended to exclude neoplasm. 4. Emphysema. (ICD10-J43.9) 5. Aortic Atherosclerois (ICD10-170.0)    Pulmonary Functions Testing Results: 04/27/2018: FEV1 3.2 L, 130% predicted FVC 4.07 L,  118% predicted, postbronchodilator Ratio 78 RV/TLC 61% predicted, DLCO 44% predicted.  Echo 03/23/2018 Study Conclusions - Left ventricle: The cavity size was normal. Systolic function was normal. The estimated ejection fraction was in the range of 55% to 60%. Wall motion was normal; there were no regional wall motion abnormalities. Doppler parameters are consistent with abnormal left ventricular relaxation (grade 1 diastolic dysfunction). - Aortic valve: There was trivial regurgitation. - Mitral valve: There was mild regurgitation. - Pulmonary arteries: PA peak pressure: 31 mm Hg (S). Impressions: - Normal LVEF. Ascending aorte - 4.0 cm Trace AR.  03/29/2018- Echo Stress - Stress ECG conclusions: There were no stress arrhythmias or conduction abnormalities. The stress ECG was normal. - Staged echo: There was no echocardiographic evidence for stress-induced ischemia. - Immediate post stress: RV size was moderately enlarged. RV global systolic function was mildly to moderately depressed by visual assessment with a D shaped septum. There was no TR to estimate RV pressure. - On review of his TTE last week the RV is dilated.    CBC Latest Ref Rng & Units 06/20/2020 12/12/2018 12/12/2018  WBC 4.0 - 10.5 K/uL 10.6(H) 7.4 7.2  Hemoglobin 13.0 - 17.0 g/dL 14.0 13.7 13.6  Hematocrit 39 - 52 % 43.1 42.0 42.5  Platelets 150 - 400 K/uL 245.0 202 209    BMP Latest Ref Rng & Units 06/13/2020  06/05/2020 05/30/2020  Glucose 65 - 99 mg/dL 90 80 90  BUN 8 - 27 mg/dL 15 15 23   Creatinine 0.76 - 1.27 mg/dL 1.24 1.12 1.52(H)  BUN/Creat Ratio 10 - 24 12 13 15   Sodium 134 - 144 mmol/L 137 136 136  Potassium 3.5 - 5.2 mmol/L 4.7 5.0 5.0  Chloride 96 - 106 mmol/L 100 97 100  CO2 20 - 29 mmol/L 26 25 25   Calcium 8.6 - 10.2 mg/dL 8.9 8.9 8.6    BNP No results found for: BNP  ProBNP No results found for: PROBNP  PFT    Component Value Date/Time   FEV1PRE 2.92  06/26/2020 1550   FEV1POST 3.02 06/26/2020 1550   FVCPRE 3.75 06/26/2020 1550   FVCPOST 3.85 06/26/2020 1550   TLC 5.75 06/26/2020 1550   DLCOUNC 9.44 06/26/2020 1550   PREFEV1FVCRT 78 06/26/2020 1550   PSTFEV1FVCRT 78 06/26/2020 1550    CT Chest High Resolution  Result Date: 06/25/2020 CLINICAL DATA:  Worsening dyspnea for several months. Former smoker. EXAM: CT CHEST WITHOUT CONTRAST TECHNIQUE: Multidetector CT imaging of the chest was performed following the standard protocol without intravenous contrast. High resolution imaging of the lungs, as well as inspiratory and expiratory imaging, was performed. COMPARISON:  01/24/2019 chest CT. FINDINGS: Cardiovascular: Stable mild cardiomegaly. No significant pericardial effusion/thickening. Left anterior descending and right coronary atherosclerosis. Atherosclerotic thoracic aorta with stable ectatic 4.0 cm ascending thoracic aorta. Stable top-normal caliber main pulmonary artery (3.1 cm diameter). Mediastinum/Nodes: No discrete thyroid nodules. Unremarkable esophagus. No axillary adenopathy. Shotty nonenlarged paratracheal and subcarinal nodes are unchanged. No discrete hilar adenopathy on these noncontrast images. Lungs/Pleura: No pneumothorax. No pleural effusion. Moderate to severe centrilobular and paraseptal emphysema. Irregular thick bandlike subpleural consolidation with associated bronchiectasis, volume loss and distortion at the right lung apex, unchanged, compatible with nonspecific postinfectious scarring. New 4 mm solid anterior left upper lobe pulmonary nodule (series 3/image 50). Apical left upper lobe 4 mm solid pulmonary nodule (series 3/image 24), stable, considered benign. No acute consolidative airspace disease, lung masses or additional new significant pulmonary nodules. No significant lobular air trapping or evidence of tracheobronchomalacia on the expiration sequence. Scattered mild septal thickening associated with foci of paraseptal  emphysema throughout both lungs, not substantially changed. No significant regions of traction bronchiectasis, ground-glass opacity, architectural distortion or frank honeycombing. Upper abdomen: Surgical clips noted surrounding the esophagogastric junction. Musculoskeletal: No aggressive appearing focal osseous lesions. Moderate thoracic spondylosis. IMPRESSION: 1. Moderate to severe centrilobular and paraseptal emphysema. Chronic mild septal thickening associated with areas of paraseptal emphysema, unchanged, compatible with nonspecific mild smoking related fibrosis. 2. No compelling evidence of interstitial lung disease. 3. New 4 mm solid anterior left upper lobe pulmonary nodule. Suggest follow-up chest CT in 12 months in this high risk patient. No follow-up needed if patient is low-risk. Non-contrast chest CT can be considered in 12 months if patient is high-risk. This recommendation follows the consensus statement: Guidelines for Management of Incidental Pulmonary Nodules Detected on CT Images: From the Fleischner Society 2017; Radiology 2017; 284:228-243. 4. 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 5. Stable mild cardiomegaly. Two-vessel coronary atherosclerosis. 6. Aortic Atherosclerosis (ICD10-I70.0) and Emphysema (ICD10-J43.9). Electronically Signed   By: Ilona Sorrel M.D.   On: 06/25/2020 15:06   CT CORONARY MORPH W/CTA COR W/SCORE W/CA W/CM &/OR WO/CM  Addendum Date: 06/06/2020   ADDENDUM REPORT: 06/06/2020 22:47 CLINICAL DATA:  Chest pain EXAM: Cardiac CTA  MEDICATIONS: Sub lingual nitro. 4mg  x 2 TECHNIQUE: The patient was scanned on a Siemens 177 slice scanner. Gantry rotation speed was 250 msecs. Collimation was 0.6 mm. A 100 kV prospective scan was triggered in the ascending thoracic aorta at 35-75% of the R-R interval. Average HR during the scan was 60 bpm. The 3D data set was interpreted on a dedicated work station using  MPR, MIP and VRT modes. A total of 80cc of contrast was used. FINDINGS: Non-cardiac: See separate report from [facility]. Pulmonary veins drain normally to the left atrium. Calcium Score: 345 Agatston units. Coronary Arteries: Right dominant with no anomalies LM: Mixed plaque distal left main, mild (<50%) stenosis. LAD system: Mixed plaque proximal LAD.  Suspect about 50% stenosis. Circumflex system: Small ramus, no significant disease. No plaque or stenosis in the LCx system. RCA system: Mixed plaque proximal and mid RCA, mild (<50%) stenosis. IMPRESSION: 1. Coronary artery calcium score 345 Agatston units. This places the patient in the 49th percentile for age and gender, suggesting intermediate risk for future cardiac events. 2. Suspect no hemodynamically significant disease, worst area appears to be 50% mid LAD stenosis. Will send for FFR to confirm. Dalton Mclean Electronically Signed   By: Loralie Champagne M.D.   On: 06/06/2020 22:47   Result Date: 06/06/2020 EXAM: OVER-READ INTERPRETATION  CT CHEST The following report is an over-read performed by radiologist Dr. Vinnie Langton of [facility], [place] on 06/06/2020. This over-read does not include interpretation of cardiac or coronary anatomy or pathology. The coronary calcium score/coronary CTA interpretation by the cardiologist is attached. COMPARISON:  None. FINDINGS: Aortic atherosclerosis. Extensive emphysematous changes in the lungs bilaterally. Within the visualized portions of the thorax there are no suspicious appearing pulmonary nodules or masses, there is no acute consolidative airspace disease, no pleural effusions, no pneumothorax and no lymphadenopathy. Visualized portions of the upper abdomen are unremarkable. There are no aggressive appearing lytic or blastic lesions noted in the visualized portions of the skeleton. IMPRESSION: 1.  Aortic Atherosclerosis (ICD10-I70.0). 2.  Emphysema (ICD10-J43.9). Electronically Signed: By:  Vinnie Langton M.D. On: 06/06/2020 15:28   CT CORONARY FRACTIONAL FLOW RESERVE DATA PREP  Result Date: 06/07/2020 CLINICAL DATA:  Chest pain EXAM: CT FFR MEDICATIONS: No additional medications. TECHNIQUE: The coronary CT was sent for FFR FINDINGS: 0.95 distal RCA 0.93 mid LAD 0.93 mid D1 IMPRESSION: No evidence for hemodynamically significant coronary disease. Dalton Mclean Electronically Signed   By: Loralie Champagne M.D.   On: 06/07/2020 16:48     Past medical hx Past Medical History:  Diagnosis Date  . Basal cell carcinoma   . Benign prostatic hypertrophy   . Bradycardia 12/06/2019  . Coronary artery calcification seen on CAT scan 05/28/2020  . Dyslipidemia   . Dyspnea on exertion 03/18/2018  . Emphysema lung ([place]) 03/18/2018  . Exudative age-related macular degeneration of left eye with active choroidal neovascularization ([place]) 12/18/2019  . Gallbladder problem   . Gastroesophageal reflux disease   . Hiatal hernia   . Hilar adenopathy 03/25/2018  . History of smoking 03/18/2018  . 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 Macular pucker, right eye 04/30/2020  . Mediastinal adenopathy 03/25/2018  . Memory loss 05/09/2013  . Mild memory disturbance   . Nuclear sclerotic cataract of right eye 12/18/2019  . Posterior vitreous detachment of right eye 12/18/2019  . 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  . Pulmonary hypertension ([place])   . Right upper lobe pulmonary nodule 03/25/2018  . Syncope and collapse 12/12/2018  .  TIA (transient ischemic attack)      Social History   Tobacco Use  . Smoking status: Former Smoker    Quit date: 05/04/1979    Years since quitting: 41.1  . Smokeless tobacco: Never Used  Substance Use Topics  . Alcohol use: Yes    Alcohol/week: 15.0 - 20.0 standard drinks    Types: 15 - 20 Cans of beer per week  . Drug use: Never    Mr.Petit reports that he quit smoking about 41 years ago. He has never used smokeless tobacco. He reports current alcohol use of about 15.0 - 20.0 standard drinks  of alcohol per week. He reports that he does not use drugs.  Tobacco Cessation: Former smoker quit 1980 with a 41 pack year smoking history  Past surgical hx, Family hx, Social hx all reviewed.  Current Outpatient Medications on File Prior to Visit  Medication Sig  . aspirin 81 MG tablet Take 81 mg by mouth daily.   Marland Kitchen atorvastatin (LIPITOR) 20 MG tablet Take 20 mg by mouth daily.  . cholecalciferol (VITAMIN D) 1000 UNITS tablet Take 1,000 Units by mouth daily.  Marland Kitchen donepezil (ARICEPT) 10 MG tablet TAKE 1 TABLET BY MOUTH  DAILY  . Fexofenadine-Pseudoephedrine (ALLEGRA-D 24 HOUR PO) Take 1 tablet by mouth daily.  . finasteride (PROSCAR) 5 MG tablet Take 5 mg by mouth daily.  Marland Kitchen ipratropium (ATROVENT) 0.06 % nasal spray Place 2 sprays into both nostrils daily as needed for rhinitis.   . Melatonin 10 MG TABS Take 10 mg by mouth at bedtime.  . Multiple Vitamins-Minerals (MULTIVITAMIN ADULTS 50+) TABS Take 1 tablet by mouth daily.   . nitroGLYCERIN (NITROSTAT) 0.4 MG SL tablet Place 1 tablet (0.4 mg total) under the tongue every 5 (five) minutes as needed.  Marland Kitchen omeprazole (PRILOSEC) 40 MG capsule Take 40 mg by mouth daily.   Marland Kitchen thiamine 100 MG tablet Take 1 tablet (100 mg total) by mouth daily.  . traZODone (DESYREL) 100 MG tablet TAKE 2 TABLETS BY MOUTH AT  BEDTIME   No current facility-administered medications on file prior to visit.     No Known Allergies  Review Of Systems:  Constitutional:   No  weight loss, night sweats,  Fevers, chills, fatigue, or  lassitude.  HEENT:   No headaches,  Difficulty swallowing,  Tooth/dental problems, or  Sore throat,                No sneezing, itching, ear ache, nasal congestion, post nasal drip,   CV:  No chest pain,  Orthopnea, PND, swelling in lower extremities, anasarca, dizziness, palpitations, syncope.   GI  No heartburn, indigestion, abdominal pain, nausea, vomiting, diarrhea, change in bowel habits, loss of appetite, bloody stools.   Resp: +  shortness of breath with exertion less at rest.  No excess mucus, no productive cough,  No non-productive cough,  No coughing up of blood.  No change in color of mucus.  No wheezing.  No chest wall deformity  Skin: no rash or lesions.  GU: no dysuria, change in color of urine, no urgency or frequency.  No flank pain, no hematuria   MS:  No joint pain or swelling.  No decreased range of motion.  No back pain.  Psych:  No change in mood or affect. No depression or anxiety.  No memory loss.   Vital Signs BP 112/72 (BP Location: Left Arm, Cuff Size: Normal)   Pulse 74   Temp 97.9 F (36.6 C) (Oral)  Ht 5\' 6"  (1.676 m)   Wt 164 lb 6.4 oz (74.6 kg)   SpO2 93%   BMI 26.53 kg/m    Physical Exam:  General- No distress,  A&Ox3, agitated ENT: No sinus tenderness, TM clear, pale nasal mucosa, no oral exudate,no post nasal drip, no LAN Cardiac: S1, S2, regular rate and rhythm, no murmur Chest: No wheeze/ rales/ dullness; no accessory muscle use, no nasal flaring, no sternal retractions, few crackles per bases Abd.: Soft Non-tender, ND, BS +, Body mass index is 26.53 kg/m. Ext: No clubbing cyanosis, edema Neuro:  normal strength, MAE x 4, A&O x 3 Skin: No rashes, No lesions, warm and dry Psych: normal mood and behavior   Assessment/Plan Dyspnea and ambulatory hypoxemia  Emphysema/ Former smoker  Stable fibrosis 2/2 smoking Hx No evidence of ILD on HRCT Stable PFT's with disproportionate DLCO of 44% Pulmonary Nodules Normal d dimer 1 month ago Plan Please wear your oxygen to maintain your sats > 88% at all times.  Wear oxygen  with activity at 5 L pulsed and 3-4 L continuous.  We will send in a prescription for Albuterol inhaler. Use as needed for shortness of breath or wheezing, no more than 2-3 times daily Follow up with Dr. Silas Flood at first available ( Pt is anxious to speak with him) Please contact office for sooner follow up if symptoms do not improve or worsen or seek  emergency care  We will place an order with [employer] to provide you with an [employer] portable oxygen machine.  If they cannot provide this machine, please call the [employer] and see if they can provide you with a portable devise  You  needs 5 L pulsed oxygen with activity, and 3-4 L continuous with activity.  We will  have [employer] come and check the machine at your home to see why it is taking so long to fill. We will order a VQ scan to evaluate for CTEPH. You will get a call to schedule We will order a Home sleep study for sleep apnea. You will get a call to schedule  If OSA present would ensure good adherence for 3-6 months then repeat TTE.  If no OSA or has continued evidence of PHTN on repeat TTE, consider RHC.  Or, consider waiting 3 months after instituting ambulatory oxygen to see if TTE improves with oxygen therapy.  We will call you with results of both tests.  We will send in a prescription for Albuterol inhaler. Use as needed for shortness of breath or wheezing, no more than 2-3 times daily. This may not help, but worth a try Follow up with Dr. Silas Flood  07/04/2020 at 11:30 am ( Pt is anxious to speak with him) Please contact office for sooner follow up if symptoms do not improve or worsen or seek emergency care   Depression( situational) Plan Have encouraged patient to see PCP for treatment for his depression.   This appointment was 45 min long with over 50% of the time in direct face-to-face patient care, assessment, plan of care, and follow-up.    Magdalen Spatz, NP 07/03/2020  4:48 PM

## 2020-07-04 ENCOUNTER — Ambulatory Visit: Payer: Medicare Other | Admitting: Pulmonary Disease

## 2020-07-04 ENCOUNTER — Encounter: Payer: Self-pay | Admitting: Pulmonary Disease

## 2020-07-04 VITALS — BP 120/66 | HR 55 | Temp 97.3°F | Ht 66.0 in | Wt 161.0 lb

## 2020-07-04 DIAGNOSIS — I272 Pulmonary hypertension, unspecified: Secondary | ICD-10-CM

## 2020-07-04 NOTE — Patient Instructions (Addendum)
Nice to meet you!  Please wear the oxygen, keep O2 sat over 90%.  We will get the scans for the blood clots and sleeping tests in coming days.  We will repeat a heart ultrasound in 3 months.  Come back for follow up with Dr. Silas Flood in 3 months so we can discuss results of heart ultrasound.  Follow up needs to be scheduled shortly after echocardiogram is complete

## 2020-07-05 ENCOUNTER — Telehealth: Payer: Self-pay | Admitting: Pulmonary Disease

## 2020-07-05 NOTE — Telephone Encounter (Signed)
MH the pts wife has filed a complain with [employer]---she stated that the pt is having to sit in a chair all day to wait for the oxygen to fill up.  [employer] has told her that they will fix this.  She is wanting to let you know and see if there is anything else that they could do.

## 2020-07-05 NOTE — Telephone Encounter (Signed)
Called and spoke with patient and provided him with 2 numbers for [employer] that he can call. According to [facility] they went out to the patients house yesterday to get this taken care of and no one was home. Provided patient with [phone] and [phone]. Patient also stated that he has purchased an [employer] machine and they have faxed over a form to be filled out by Dr. Silas Flood and they have not received anything back. Patient cannot get this machine until they get the form back.  Dr. Silas Flood have you received a form for this patient from [place]? Please route response back to triage

## 2020-07-05 NOTE — Telephone Encounter (Signed)
pt's wife stating that they want to file a complaint with [employer] - I advised that they will have to call [employer] and speak to their manager - she states that she has and they  say they will fix it - states that pt has to sit in a chair all day for his O2 to fill up - pt's wife is looking for help.

## 2020-07-08 ENCOUNTER — Telehealth: Payer: Self-pay | Admitting: Pulmonary Disease

## 2020-07-08 NOTE — Telephone Encounter (Signed)
I have not received a fax

## 2020-07-08 NOTE — Telephone Encounter (Signed)
Spoke with pt's spouse Pam  I advised still no fax received from [place]  I provided her with the up front fax and she will call [employer] and have them fax again  She states also wanting pt's o2 order to be sent somewhere else b/c they are not having any luck with [employer] coming out to fix pt's o2 issue.  She states they would rather switch companies than keep dealing with [employer]  PCC's can you use the recent order and send to another DME or will this need a brand new order?

## 2020-07-08 NOTE — Telephone Encounter (Signed)
Pam wife is checking on fax for portable oxygen concentrator. Also checking on [employer]. Pam phone number is [phone] h or [phone] c.

## 2020-07-08 NOTE — Telephone Encounter (Addendum)
Called pt & spoke to him and his wife.  Told them per note pt has appt today with [employer].  They said that's what they've been told every day.  I called Customer Service and spoke to [place] and she states it has been assigned to a driver and will send high priority e-mail to logistics dept to make sure pt is seen today.  Called pt back & he states he received phone call from [employer] as soon as I hung up from him and he was told they would be there in 35 minutes.  Nothing further needed.

## 2020-07-09 ENCOUNTER — Telehealth: Payer: Self-pay | Admitting: Pulmonary Disease

## 2020-07-09 NOTE — Telephone Encounter (Signed)
Form found in Dr. Kavin Leech mail folder Dr. Silas Flood will return to office on 07/12/20  Patient's wife states they need this form signed as soon as possible. Let her know Dr. Silas Flood will be in office on Friday to sign due to him being at the hospital the rest of the week  I will route to Dr. Silas Flood to see if he is able to sign before his Office return on Friday 07/12/20

## 2020-07-10 NOTE — Telephone Encounter (Signed)
This order has been taken care of corrected and precerted pt is aware and will do VQ scan 07/15/20@[facility]  [place]ellen [place]

## 2020-07-12 ENCOUNTER — Telehealth: Payer: Self-pay | Admitting: Pulmonary Disease

## 2020-07-12 ENCOUNTER — Telehealth: Payer: Self-pay | Admitting: Acute Care

## 2020-07-12 NOTE — Telephone Encounter (Signed)
I attempted to call [facility] to see what was needed for the inhaler rx.  They are closed today in observance of the holiday.  We will need to call back on Monday.    I called pt and advised him of this and he is aware that we will call him back on Monday once we have spoken to [facility].

## 2020-07-12 NOTE — Telephone Encounter (Signed)
MH given the [employer] forms to sign.  Nothing further is needed.

## 2020-07-15 ENCOUNTER — Ambulatory Visit (HOSPITAL_COMMUNITY)
Admission: RE | Admit: 2020-07-15 | Discharge: 2020-07-15 | Disposition: A | Discharge status: Home / Self Care | Payer: Medicare Other | Source: Ambulatory Visit | Attending: Acute Care | Admitting: Acute Care

## 2020-07-15 ENCOUNTER — Other Ambulatory Visit: Payer: Self-pay

## 2020-07-15 ENCOUNTER — Other Ambulatory Visit: Payer: Self-pay | Admitting: Acute Care

## 2020-07-15 ENCOUNTER — Encounter (HOSPITAL_COMMUNITY): Payer: Medicare Other

## 2020-07-15 ENCOUNTER — Ambulatory Visit (HOSPITAL_COMMUNITY): Payer: Medicare Other

## 2020-07-15 ENCOUNTER — Telehealth: Payer: Self-pay | Admitting: Acute Care

## 2020-07-15 DIAGNOSIS — I2724 Chronic thromboembolic pulmonary hypertension: Secondary | ICD-10-CM | POA: Insufficient documentation

## 2020-07-15 MED ORDER — ALBUTEROL SULFATE HFA 108 (90 BASE) MCG/ACT IN AERS: 2.0000 | INHALATION_SPRAY | Freq: Four times a day (QID) | RESPIRATORY_TRACT | 6 refills | Status: DC | PRN

## 2020-07-15 MED ORDER — TECHNETIUM TO 99M ALBUMIN AGGREGATED
4.2000 | Freq: Once | INTRAVENOUS | Status: AC | PRN
  Administered 2020-07-15: 4.2 via INTRAVENOUS

## 2020-07-15 NOTE — Telephone Encounter (Signed)
Please call patient and let him know his CXR is normal for him,  and the scan we did for blood clots does not show any evidence of blood clots, which is good news. Have him follow up with Dr. Silas Flood as scheduled. ( he should have a 3 month follow up from his last visit 11/18. Thanks Also, please attach me to the normal results pool. When I tried to add as a recipient of this message, it would tell me it was an invalid recipient. Thanks so much

## 2020-07-15 NOTE — Progress Notes (Signed)
I have sent this message to Triage to be called. Please see telephone message dated 07/15/2020. Thanks so much

## 2020-07-15 NOTE — Telephone Encounter (Signed)
Called and spoke with patient and advised of results and recommendations.  He verbalized understanding. Patient was put in for a recall as schedule was not out at the time of his visit.  Patient scheduled appointment for January 18th.  Nothing further needed.

## 2020-07-16 NOTE — Telephone Encounter (Signed)
lmtcb for pt.  Called [facility] at [phone] and spoke to pharmacist and was advised that Ventolin was sent to [facility] but insurance does not cover Ventolin, insurance covers [employer]. Gave verbal order for proair. Will keep message open to inform pt.

## 2020-07-26 ENCOUNTER — Other Ambulatory Visit: Payer: Self-pay

## 2020-07-26 ENCOUNTER — Ambulatory Visit: Payer: Medicare Other

## 2020-07-26 DIAGNOSIS — G4736 Sleep related hypoventilation in conditions classified elsewhere: Secondary | ICD-10-CM

## 2020-07-26 DIAGNOSIS — G4733 Obstructive sleep apnea (adult) (pediatric): Secondary | ICD-10-CM

## 2020-07-30 ENCOUNTER — Ambulatory Visit (INDEPENDENT_AMBULATORY_CARE_PROVIDER_SITE_OTHER): Payer: Medicare Other | Admitting: Ophthalmology

## 2020-07-30 ENCOUNTER — Encounter (INDEPENDENT_AMBULATORY_CARE_PROVIDER_SITE_OTHER): Payer: Self-pay | Admitting: Ophthalmology

## 2020-07-30 ENCOUNTER — Other Ambulatory Visit: Payer: Self-pay

## 2020-07-30 DIAGNOSIS — H353221 Exudative age-related macular degeneration, left eye, with active choroidal neovascularization: Secondary | ICD-10-CM | POA: Diagnosis not present

## 2020-07-30 DIAGNOSIS — H353112 Nonexudative age-related macular degeneration, right eye, intermediate dry stage: Secondary | ICD-10-CM

## 2020-07-30 DIAGNOSIS — H35371 Puckering of macula, right eye: Secondary | ICD-10-CM | POA: Diagnosis not present

## 2020-07-30 HISTORY — DX: Nonexudative age-related macular degeneration, right eye, intermediate dry stage: H35.3112

## 2020-07-30 MED ORDER — BEVACIZUMAB 2.5 MG/0.1ML IZ SOSY
2.5000 mg | PREFILLED_SYRINGE | INTRAVITREAL | Status: AC | PRN
  Administered 2020-07-30: 12:00:00 2.5 mg via INTRAVITREAL

## 2020-07-30 NOTE — Assessment & Plan Note (Signed)
Much less active CNVM in fact not active today at 50-month follow-up post Avastin yet patient continues on full-time supplemental oxygen concentrator, and is only slightly over 1 year post onset of macular disease complication.  Thus we will repeat injection today  Follow-up examination in 3 months because of high risk of recurrence given the hypoxic disease combined with prior history of active CNVM

## 2020-07-30 NOTE — Assessment & Plan Note (Signed)
Minor no effect on acuity

## 2020-07-30 NOTE — Patient Instructions (Signed)
Patient instructed to contact the office promptly for new onset visual acuity declines or distortions 

## 2020-07-30 NOTE — Assessment & Plan Note (Signed)

## 2020-07-30 NOTE — Progress Notes (Signed)
07/30/2020     CHIEF COMPLAINT Patient presents for Retina Follow Up   HISTORY OF PRESENT ILLNESS: Frank Meyer is a 79 y.o. 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  Severity is moderate.  Duration of 3 months.  Since onset it is stable.  I, the attending physician,  performed the HPI with the patient and updated documentation appropriately.          Comments    3 Month Wet AMD f\u OS. Possible Avastin OS. OCT  Pt states no changes in vision. Denies complaints.       Last edited by Tilda Franco on 07/30/2020 11:07 AM. (History)      Referring physician: Burnard Bunting, MD [address] [place],  [place] [address]  HISTORICAL INFORMATION:   Selected notes from the [facility]: No current outpatient medications on file. (Ophthalmic Drugs)   No current facility-administered medications for this visit. (Ophthalmic Drugs)   Current Outpatient Medications (Other)  Medication Sig  . albuterol (VENTOLIN HFA) 108 (90 Base) MCG/ACT inhaler Inhale 2 puffs into the lungs every 6 (six) hours as needed for wheezing or shortness of breath.  Marland Kitchen albuterol (VENTOLIN HFA) 108 (90 Base) MCG/ACT inhaler Inhale 2 puffs into the lungs every 6 (six) hours as needed for wheezing or shortness of breath.  Marland Kitchen aspirin 81 MG tablet Take 81 mg by mouth daily.   Marland Kitchen atorvastatin (LIPITOR) 20 MG tablet Take 20 mg by mouth daily.  . cholecalciferol (VITAMIN D) 1000 UNITS tablet Take 1,000 Units by mouth daily.  Marland Kitchen donepezil (ARICEPT) 10 MG tablet TAKE 1 TABLET BY MOUTH  DAILY  . Fexofenadine-Pseudoephedrine (ALLEGRA-D 24 HOUR PO) Take 1 tablet by mouth daily.  . finasteride (PROSCAR) 5 MG tablet Take 5 mg by mouth daily.  Marland Kitchen ipratropium (ATROVENT) 0.06 % nasal spray Place 2 sprays into both nostrils daily as needed for rhinitis.   . Melatonin 10 MG TABS Take 10 mg by mouth at  bedtime.  . Multiple Vitamins-Minerals (MULTIVITAMIN ADULTS 50+) TABS Take 1 tablet by mouth daily.   . nitroGLYCERIN (NITROSTAT) 0.4 MG SL tablet Place 1 tablet (0.4 mg total) under the tongue every 5 (five) minutes as needed.  Marland Kitchen omeprazole (PRILOSEC) 40 MG capsule Take 40 mg by mouth daily.   Marland Kitchen thiamine 100 MG tablet Take 1 tablet (100 mg total) by mouth daily.  . traZODone (DESYREL) 100 MG tablet TAKE 2 TABLETS BY MOUTH AT  BEDTIME   No current facility-administered medications for this visit. (Other)      REVIEW OF SYSTEMS:    ALLERGIES No Known Allergies  PAST MEDICAL HISTORY Past Medical History:  Diagnosis Date  . Basal cell carcinoma   . Benign prostatic hypertrophy   . Bradycardia 12/06/2019  . Coronary artery calcification seen on CAT scan 05/28/2020  . Dyslipidemia   . Dyspnea on exertion 03/18/2018  . Emphysema lung ([place]) 03/18/2018  . Exudative age-related macular degeneration of left eye with active choroidal neovascularization ([place]) 12/18/2019  . Gallbladder problem   . Gastroesophageal reflux disease   . Hiatal hernia   . Hilar adenopathy 03/25/2018  . History of smoking 03/18/2018  . 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 Macular pucker, right eye 04/30/2020  . Mediastinal adenopathy 03/25/2018  . Memory loss 05/09/2013  . Mild memory disturbance   .  Nuclear sclerotic cataract of right eye 12/18/2019  . Posterior vitreous detachment of right eye 12/18/2019  . 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  . Pulmonary hypertension ([place])   . Right upper lobe pulmonary nodule 03/25/2018  . Syncope and collapse 12/12/2018  . TIA (transient ischemic attack)    Past Surgical History:  Procedure Laterality Date  . none      FAMILY HISTORY Family History  Problem Relation Age of Onset  . Obesity Mother   . Memory loss Mother     SOCIAL HISTORY Social History   Tobacco Use  . Smoking status: Former Smoker    Quit date: 05/04/1979    Years since quitting: 41.2  . Smokeless tobacco: Never Used   Substance Use Topics  . Alcohol use: Yes    Alcohol/week: 15.0 - 20.0 standard drinks    Types: 15 - 20 Cans of beer per week  . Drug use: Never         OPHTHALMIC EXAM:  Base Eye Exam    Visual Acuity (Snellen - Linear)      Right Left   Dist [place] 20/20 -1 20/20 -2       Tonometry (Tonopen, 11:11 AM)      Right Left   Pressure 13 14       Pupils      Pupils Dark Light Shape React APD   Right PERRL 3 2 Round Brisk None   Left PERRL 3 2 Round Brisk None       Visual Fields (Counting fingers)      Left Right     Full       Neuro/Psych    Oriented x3: Yes   Mood/Affect: Normal       Dilation    Left eye: 1.0% Mydriacyl, 2.5% Phenylephrine @ 11:11 AM        Slit Lamp and Fundus Exam    External Exam      Right Left   External Normal Normal       Slit Lamp Exam      Right Left   Lids/Lashes Normal Normal   Conjunctiva/Sclera White and quiet White and quiet   Cornea Clear Clear   Anterior Chamber Deep and quiet Deep and quiet   Iris Round and reactive Round and reactive   Lens 2+ Nuclear sclerosis Posterior chamber intraocular lens   Anterior Vitreous Normal Normal       Fundus Exam      Right Left   Posterior Vitreous  Posterior vitreous detachment   Disc  Normal   C/D Ratio  0.1   Macula  Retinal pigment epithelial mottling, Early age related macular degeneration, no hemorrhage, Hard drusen, no macular thickening, no exudates, Geographic atrophy   Vessels  Normal   Periphery  Normal          IMAGING AND PROCEDURES  Imaging and Procedures for 07/30/20  OCT, Retina - OU - Both Eyes       Right Eye Quality was good. Scan locations included subfoveal. Central Foveal Thickness: 305. Progression has been stable. Findings include no SRF, no IRF, epiretinal membrane.   Left Eye Quality was good. Scan locations included subfoveal. Central Foveal Thickness: 302. Progression has improved. Findings include retinal drusen , no IRF, no SRF,  subretinal hyper-reflective material.   Notes OD, dry ARMD, minor epiretinal membrane on distorting  OS, subretinal hyper reflective material in the region of previous active CNVM, fibrosis, stable at 50-month follow-up post Avastin, repeat  today       Intravitreal Injection, Pharmacologic Agent - OS - Left Eye       Time Out 07/30/2020. 11:36 AM. Confirmed correct patient, procedure, site, and patient consented.   Anesthesia Topical anesthesia was used. Anesthetic medications included Akten 3.5%.   Procedure Preparation included 10% betadine to eyelids, 5% betadine to ocular surface, Tobramycin 0.3%. A 30 gauge needle was used.   Injection:  2.5 mg Bevacizumab (AVASTIN) 2.5mg /0.66mL SOSY   NDC: 85462-703-50, Lot: [id number]   Route: Intravitreal, Site: Left Eye  Post-op Post injection exam found visual acuity of at least counting fingers. The patient tolerated the procedure well. There were no complications. The patient received written and verbal post procedure care education. Post injection medications were not given.                 ASSESSMENT/PLAN:  Exudative age-related macular degeneration of left eye with active choroidal neovascularization (HCC) Much less active CNVM in fact not active today at 66-month follow-up post Avastin yet patient continues on full-time supplemental oxygen concentrator, and is only slightly over 1 year post onset of macular disease complication.  Thus we will repeat injection today  Follow-up examination in 3 months because of high risk of recurrence given the hypoxic disease combined with prior history of active CNVM  Macular pucker, right eye Minor no effect on acuity  Intermediate stage nonexudative age-related macular degeneration of right eye The nature of age--related macular degeneration was discussed with the patient as well as the distinction between dry and wet types. Checking an Amsler Grid daily with advice to return immediately  should a distortion develop, was given to the patient. The patient 's smoking status now and in the past was determined and advice based on the AREDS study was provided regarding the consumption of antioxidant supplements. AREDS 2 vitamin formulation was recommended. Consumption of dark leafy vegetables and fresh fruits of various colors was recommended. Treatment modalities for wet macular degeneration particularly the use of intravitreal injections of anti-blood vessel growth factors was discussed with the patient. Avastin, Lucentis, and Eylea are the available options. On occasion, therapy includes the use of photodynamic therapy and thermal laser. Stressed to the patient do not rub eyes.  Patient was advised to check Amsler Grid daily and return immediately if changes are noted. Instructions on using the grid were given to the patient. All patient questions were answered.      ICD-10-CM   1. Exudative age-related macular degeneration of left eye with active choroidal neovascularization (HCC)  H35.3221 OCT, Retina - OU - Both Eyes    Intravitreal Injection, Pharmacologic Agent - OS - Left Eye    bevacizumab (AVASTIN) SOSY 2.5 mg  2. Macular pucker, right eye  H35.371   3. Intermediate stage nonexudative age-related macular degeneration of right eye  H35.3112     1.  Repeat intravitreal Avastin OS today and examination OU next, in 3 months  2.  3.  Ophthalmic Meds Ordered this visit:  Meds ordered this encounter  Medications  . bevacizumab (AVASTIN) SOSY 2.5 mg       Return in about 3 months (around 10/28/2020) for DILATE OU, AVASTIN OCT, OS.  Patient Instructions  Patient instructed to contact the office promptly for new onset visual acuity declines or distortions    Explained the diagnoses, plan, and follow up with the patient and they expressed understanding.  Patient expressed understanding of the importance of proper follow up care.   Clent Demark Ellene Bloodsaw  M.D. Diseases & Surgery of  the [facility] and [place] 07/30/20     Abbreviations: M myopia (nearsighted); A astigmatism; H hyperopia (farsighted); P presbyopia; Mrx spectacle prescription;  CTL contact lenses; OD right eye; OS left eye; OU both eyes  XT exotropia; ET esotropia; PEK punctate epithelial keratitis; PEE punctate epithelial erosions; DES dry eye syndrome; MGD meibomian gland dysfunction; ATs artificial tears; PFAT's preservative free artificial tears; [place] nuclear sclerotic cataract; PSC posterior subcapsular cataract; ERM epi-retinal membrane; PVD posterior vitreous detachment; RD retinal detachment; DM diabetes mellitus; DR diabetic retinopathy; NPDR non-proliferative diabetic retinopathy; PDR proliferative diabetic retinopathy; CSME clinically significant macular edema; DME diabetic macular edema; dbh dot blot hemorrhages; CWS cotton wool spot; POAG primary open angle glaucoma; C/D cup-to-disc ratio; HVF humphrey visual field; GVF goldmann visual field; OCT optical coherence tomography; IOP intraocular pressure; BRVO Branch retinal vein occlusion; CRVO central retinal vein occlusion; CRAO central retinal artery occlusion; BRAO branch retinal artery occlusion; RT retinal tear; SB scleral buckle; PPV pars plana vitrectomy; VH Vitreous hemorrhage; PRP panretinal laser photocoagulation; IVK intravitreal kenalog; VMT vitreomacular traction; MH Macular hole;  NVD neovascularization of the disc; NVE neovascularization elsewhere; AREDS age related eye disease study; ARMD age related macular degeneration; POAG primary open angle glaucoma; EBMD epithelial/anterior basement membrane dystrophy; ACIOL anterior chamber intraocular lens; IOL intraocular lens; PCIOL posterior chamber intraocular lens; Phaco/IOL phacoemulsification with intraocular lens placement; [place] photorefractive keratectomy; LASIK laser assisted in situ keratomileusis; HTN hypertension; DM diabetes mellitus; COPD chronic obstructive  pulmonary disease

## 2020-08-02 DIAGNOSIS — R0683 Snoring: Secondary | ICD-10-CM

## 2020-08-28 ENCOUNTER — Encounter: Payer: Self-pay | Admitting: Acute Care

## 2020-08-28 ENCOUNTER — Other Ambulatory Visit: Payer: Self-pay

## 2020-08-28 ENCOUNTER — Ambulatory Visit (INDEPENDENT_AMBULATORY_CARE_PROVIDER_SITE_OTHER): Payer: Medicare Other | Admitting: Acute Care

## 2020-08-28 DIAGNOSIS — J84112 Idiopathic pulmonary fibrosis: Secondary | ICD-10-CM

## 2020-08-28 DIAGNOSIS — G4734 Idiopathic sleep related nonobstructive alveolar hypoventilation: Secondary | ICD-10-CM | POA: Diagnosis not present

## 2020-08-28 NOTE — Patient Instructions (Addendum)
It is good to talk with you today. You do have night time desaturations when you sleep. You do not have Obstructive Sleep Apnea. We will contact your DME and let them know you also need oxygen at 2 L with sleep. We will place an order for any additional supplies.  They should call to get this set up.  Continue wearing your oxygen at 5 L with exertion RA at rest as long as you oxygen saturations are > 88% If they are below 88% at rest add 2 L to maintain saturations above 88% at all times. ( saturation goal) Continue inhalers and nebulizer treatments as you have been doing. Follow up with Dr. Silas Flood as is scheduled 09/03/2020 Consider changing follow up until after echo has been done , so Dr. Silas Flood can discuss results at follow up.  He wants to see the repeat echo to evaluate your heart function  since you have started on oxygen, and over time as these can show dynamic changes. . Please contact office for sooner follow up if symptoms do not improve or worsen or seek emergency care

## 2020-08-28 NOTE — Progress Notes (Signed)
Virtual Visit via Video Note  I connected with Frank Meyer on 08/28/20 at  2:30 PM EST by a video enabled telemedicine application and verified that I am speaking with the correct person using two identifiers.  Location: Patient: At home Provider:  [address], [place], [place], [address]    I discussed the limitations of evaluation and management by telemedicine and the availability of in person appointments. The patient expressed understanding and agreed to proceed.  Oxygen Use  Patient Saturations on Room Air at Rest = 92%  Patient Saturations on Room Air while Ambulating = 85%  Patient Saturations on 5 Liters of oxygen while Ambulating = 92%  On POC  Will need nocturnal oxygen at 2 L [place] with sleep.     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 No obstruction on PFT's butvery significant disease on CAT scan imaging.Echocardiogram revealsevidence of pulmonary hypertension most likelyrelated to his underlying lung disease. He was started on oxygen 06/19/2020 after a walk in the office on a flat surface. He dropped his oxygen to 78% on RA. He was prescribed oxygen and PFT's and HRCT was ordered to evaluate for possible worsening of his fibrosis vs another possible cause. PFT's 06/2020 are relatively stable - much better than would be expected based on lung imaging. DLCO is reduced out of proportion to other parameters suggesting possible contribution to presumed pulmonary HTN outside of parenchymal lung disease. He has mild LA enlargement on serial TTEs raising possibility of group 2 disease.  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   History of Present Illness: Pt. And his wife present for follow up. We have reviewed his home Sleep Test. He does not have significant OSA, as his AHI was only 3.7. We did discuss that he has significant nocturnal hypoxemia that  will require him to wear his oxygen at 2 L at bedtime. He and his wife are in agreement with this plan. He is otherwise doing well, and has follow up with Dr. Silas Flood in a week.    Observations/Objective: Home Sleep Study 07/17/2020 AHI of 3.7, No OSA needing treatment Snoring with oxygen desaturations to nadir of 58% Average saturations during the study were 84%, indicating nocturnal hypoxemia. Time with oxygen saturation below 85% was 173.3 minutes. Patient qualifies for Nocturnal oxygen          HRCT 06/25/2020 Moderate to severe centrilobular and paraseptal emphysema. Chronic mild septal thickening associated with areas of paraseptal emphysema, unchanged, compatible with nonspecific mild smoking related fibrosis. No compelling evidence of interstitial lung disease. New 4 mm solid anterior left upper lobe pulmonary nodule. Suggest follow-up chest CT in 12 months in this high risk patient. No follow-up needed if patient is low-risk. Non-contrast chest CT can be considered in 12 months if patient is high-risk. This recommendation follows the consensus statement: Guidelines for Management of Incidental Pulmonary Nodules Detected on CT Images: From the Fleischner Society 2017; Radiology 2017; 284:228-243. 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 Stable mild cardiomegaly. Two-vessel coronary atherosclerosis. Aortic Atherosclerosis (ICD10-I70.0) and Emphysema (ICD10-J43.9).  PFT's 06/26/2020       VQ scan 07/15/2020 Heterogeneous decreased areas of perfusion within the upper lung zones corresponding to cystic and consolidative changes on recent CT. No evidence of pulmonary embolus   Assessment and Plan: Pt. 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 You do not have Obstructive Sleep Apnea. We will contact  your DME and let them know you also need oxygen at 2 L with sleep. We will place an order for any additional supplies.  They should call to get this set up.  Continue wearing your oxygen at 5 L with exertion RA at rest as long as you oxygen saturations are > 88% If they are below 88% at rest add 2 L to maintain saturations above 88% at all times. ( saturation goal) Continue inhalers and nebulizer treatments as you have been doing. Follow up with Dr. Silas Flood as is scheduled 09/03/2020 Follow up echo as is scheduled. Dr. Silas Flood  wants to see the changes in heart function since your last echo in April.  Please contact office for sooner follow up if symptoms do not improve or worsen or seek emergency care      Follow Up Instructions: 09/03/2020>> Consider changing follow up until after echo has been done , so Dr. Silas Flood can discuss results at follow up.    I discussed the assessment and treatment plan with the patient. The patient was provided an opportunity to ask questions and all were answered. The patient agreed with the plan and demonstrated an understanding of the instructions.   The patient was advised to call back or seek an in-person evaluation if the symptoms worsen or if the condition fails to improve as anticipated.  I provided 30 minutes of non-face-to-face time during this encounter.   Magdalen Spatz, NP 08/28/2020 3:02 PM

## 2020-09-03 ENCOUNTER — Ambulatory Visit: Payer: Medicare Other | Admitting: Pulmonary Disease

## 2020-09-03 NOTE — Progress Notes (Signed)
@Patient  ID: Frank Meyer, male    DOB: Jul 12, 1941, 80 y.o.   MRN: [id number]  Chief Complaint  Patient presents with  . 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  Patient longstanding history of shortness of breath.  He is need oxygen for some years.  Uses up to 5 L with activity, 3-4 at rest.  His shortness of breath is described as severe.  Worse with exertion.  Somewhat relieved by rest over time.  No positional changes.  Does not seem to change with seasons, environment.  No other alleviating or exacerbating factors.  No history or family history I should say of pulmonary hypertension.  He denies any connective tissue disease.  No arthralgias, arthritis.  Denies Raynaud's phenomenon.  Never taken diet supplements.  Does not use methamphetamine.  No congenital heart disease per his report.  Extensive review of medical record performed today.  Recent high-res CT chest with severe centrilobular and paraseptal emphysema on my review.  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  No evidence of ILD on my review.  TTE performed 11/2019.  This demonstrated mildly dilated LA, diastolic dysfunction, mild aortic regurgitation, mildly dilated RA, mildly enlarged RV with normal RV function, estimated elevated PASP.  Most recent PFTs 06/2020 demonstrated severely reduced DLCO 44% predicted with a surprisingly PFTs, TLC, no evidence of hyperinflation.  PMH: COPD, enlarged prostate, hyperlipidemia Surgical history: Reviewed in detail, he denies significant surgical history Family history: Memory loss in mother, denies significant pulmonary or respiratory issues and family Social history: Former smoker, 50-pack-year history, quit late 90s, retired   Licensed conveyancer / Pulmonary Flowsheets:   ACT:  No flowsheet data  found.  MMRC: No flowsheet data found.  Epworth:  No flowsheet data found.  Tests:   FENO:  No results found for: NITRICOXIDE  PFT: PFT Results Latest Ref Rng & Units 06/26/2020 04/27/2018  FVC-Pre L 3.75 4.11  FVC-Predicted Pre % 111 119  FVC-Post L 3.85 4.07  FVC-Predicted Post % 113 118  Pre FEV1/FVC % % 78 79  Post FEV1/FCV % % 78 78  FEV1-Pre L 2.92 3.23  FEV1-Predicted Pre % 122 131  FEV1-Post L 3.02 3.20  DLCO uncorrected ml/min/mmHg 9.44 11.81  DLCO UNC% % 44 44  DLCO corrected ml/min/mmHg 9.61 11.92  DLCO COR %Predicted % 44 44  DLVA Predicted % 45 49  TLC L 5.75 5.24  TLC % Predicted % 92 84  RV % Predicted % 87 51  Personally reviewed and interpreted as above  WALK:  No flowsheet data found.  Imaging: Personally reviewed and as per EMR discussion of this note  Lab Results: Personally reviewed, notably eosinophils as high as 400 06/2020 CBC    Component Value Date/Time   WBC 10.6 (H) 06/20/2020 1101   RBC 5.08 06/20/2020 1101   HGB 14.0 06/20/2020 1101   HCT 43.1 06/20/2020 1101   PLT 245.0 06/20/2020 1101   MCV 84.8 06/20/2020 1101   MCH 29.1 12/12/2018 0314   MCHC 32.6 06/20/2020 1101   RDW 14.3 06/20/2020 1101   LYMPHSABS 2.5 06/20/2020 1101   MONOABS 1.0 06/20/2020 1101   EOSABS 0.4 06/20/2020 1101   BASOSABS 0.1 06/20/2020 1101    BMET    Component Value Date/Time   NA 137 06/13/2020 0819   K 4.7 06/13/2020  0819   CL 100 06/13/2020 0819   CO2 26 06/13/2020 0819   GLUCOSE 90 06/13/2020 0819   GLUCOSE 98 12/12/2018 0314   BUN 15 06/13/2020 0819   CREATININE 1.24 06/13/2020 0819   CALCIUM 8.9 06/13/2020 0819   GFRNONAA 55 (L) 06/13/2020 0819   GFRAA 64 06/13/2020 0819    BNP No results found for: BNP  ProBNP No results found for: PROBNP  Specialty Problems      Pulmonary Problems   Dyspnea on exertion   Emphysema lung (HCC)   Hilar adenopathy   Right upper lobe pulmonary nodule      No Known  Allergies  Immunization History  Administered Date(s) Administered  . Fluad Quad(high Dose 65+) 04/29/2019, 04/20/2020  . Influenza, High Dose Seasonal PF 05/24/2017, 06/03/2018  . Influenza-Unspecified 06/04/2018, 04/27/2019  . PFIZER(Purple Top)SARS-COV-2 Vaccination 09/25/2019, 10/20/2019, 05/14/2020    Past Medical History:  Diagnosis Date  . Basal cell carcinoma   . Benign prostatic hypertrophy   . Bradycardia 12/06/2019  . Coronary artery calcification seen on CAT scan 05/28/2020  . Dyslipidemia   . Dyspnea on exertion 03/18/2018  . Emphysema lung ([place]) 03/18/2018  . Exudative age-related macular degeneration of left eye with active choroidal neovascularization ([place]) 12/18/2019  . Gallbladder problem   . Gastroesophageal reflux disease   . Hiatal hernia   . Hilar adenopathy 03/25/2018  . History of smoking 03/18/2018  . 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 Macular pucker, right eye 04/30/2020  . Mediastinal adenopathy 03/25/2018  . Memory loss 05/09/2013  . Mild memory disturbance   . Nuclear sclerotic cataract of right eye 12/18/2019  . Posterior vitreous detachment of right eye 12/18/2019  . 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  . Pulmonary hypertension ([place])   . Right upper lobe pulmonary nodule 03/25/2018  . Syncope and collapse 12/12/2018  . TIA (transient ischemic attack)     Tobacco History: Social History   Tobacco Use  Smoking Status Former Smoker  . Packs/day: 2.00  . Years: 25.00  . Pack years: 50.00  . Types: Cigarettes  . Quit date: 76  . Years since quitting: 28.0  Smokeless Tobacco Never Used   Counseling given: Not Answered   Continue to not smoke  Outpatient Encounter Medications as of 07/04/2020, 80 y.o.  Medication Sig  . aspirin 81 MG tablet Take 81 mg by mouth daily.   Marland Kitchen atorvastatin (LIPITOR) 20 MG tablet Take 20 mg by mouth daily.  . cholecalciferol (VITAMIN D) 1000 UNITS tablet Take 1,000 Units by mouth daily.  Marland Kitchen donepezil (ARICEPT) 10 MG tablet TAKE 1 TABLET BY MOUTH   DAILY  . Fexofenadine-Pseudoephedrine (ALLEGRA-D 24 HOUR PO) Take 1 tablet by mouth daily.  . finasteride (PROSCAR) 5 MG tablet Take 5 mg by mouth daily.  Marland Kitchen ipratropium (ATROVENT) 0.06 % nasal spray Place 2 sprays into both nostrils daily as needed for rhinitis.   . Melatonin 10 MG TABS Take 10 mg by mouth at bedtime.  . Multiple Vitamins-Minerals (MULTIVITAMIN ADULTS 50+) TABS Take 1 tablet by mouth daily.   . nitroGLYCERIN (NITROSTAT) 0.4 MG SL tablet Place 1 tablet (0.4 mg total) under the tongue every 5 (five) minutes as needed. (Patient not taking: Reported on 08/28/2020)  . omeprazole (PRILOSEC) 40 MG capsule Take 40 mg by mouth daily.   Marland Kitchen thiamine 100 MG tablet Take 1 tablet (100 mg total) by mouth daily.  . traZODone (DESYREL) 100 MG tablet TAKE 2 TABLETS BY MOUTH AT  BEDTIME  . [DISCONTINUED] albuterol (VENTOLIN HFA)  108 (90 Base) MCG/ACT inhaler Inhale 2 puffs into the lungs every 6 (six) hours as needed for wheezing or shortness of breath.   No facility-administered encounter medications on file as of 07/04/2020.     Review of Systems  Review of Systems  No orthopnea or PND, no chest pain with exertion.  Deny significant lower extremity swelling. Physical Exam  BP 120/66 (BP Location: Right Arm, Patient Position: Sitting, Cuff Size: Normal)   Pulse (!) 55   Temp (!) 97.3 F (36.3 C) (Temporal)   Ht 5\' 6"  (1.676 m)   Wt 161 lb (73 kg)   SpO2 91%   BMI 25.99 kg/m   Wt Readings from Last 5 Encounters:  07/04/20 161 lb (73 kg)  07/03/20 164 lb 6.4 oz (74.6 kg)  06/19/20 165 lb 6.4 oz (75 kg)  06/14/20 164 lb (74.4 kg)  05/29/20 164 lb 8 oz (74.6 kg)    BMI Readings from Last 5 Encounters:  07/04/20 25.99 kg/m  07/03/20 26.53 kg/m  06/19/20 26.70 kg/m  06/14/20 26.47 kg/m  05/29/20 26.55 kg/m     Physical Exam General: Well-appearing, sitting in chair Eyes: EOMI, no icterus Neck: Supple, no JVP appreciated Respiratory: Diminished throughout, normal work  of breathing, no wheeze Cardiovascular: Regular rhythm, no murmur Abdomen: Nondistended, bowel sounds present MSK: No synovitis, no effusion Neuro: Normal gait, no weakness Psych: Normal mood, anxious   Assessment & Plan:   Dyspnea exertion: Likely multifactorial.  Related to emphysema, suspected dynamic gas trapping although none demonstrated on the recent pulmonary function test.  Component of deconditioning.  Likely contribution from presumed pulmonary hypertension.  Please see below.  Pulmonary hypertension, presumed: Based on TTE most recently 11/2019.  No right heart cath in the past.  Suspect multifactorial.  Likely component of Group 2 disease given dilated LA, aortic valve insufficiency, diastolic dysfunction.  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  No evidence on history or exam for Group 1 disease.  Fortunately his TTE demonstrates preserved RV function and adequate volume status.  We will round out work-up for potentially reversible causes including sleep test to evaluate for nocturnal hypoxemia/OSA, VQ scan to evaluate group for disease.  Would be aggressive in treating underlying issues.  Think he is a relatively poor candidate for pulmonary vasodilators given his age, frailty, likely significant contribution of group 2 disease.  If any therapy considered, would potentially trial tadalafil monotherapy to see if he tolerates and if so could consider inhaled prostacyclin therapy.  Regardless, would want to see a few months of underlying contributors treated adequately then repeat heart ultrasound before deciding neck steps.    Return in about 3 months (around 10/04/2020).   Lanier Clam, MD 09/03/2020

## 2020-09-04 ENCOUNTER — Other Ambulatory Visit: Payer: Self-pay | Admitting: Adult Health

## 2020-09-11 ENCOUNTER — Telehealth: Payer: Self-pay | Admitting: Acute Care

## 2020-09-11 DIAGNOSIS — J439 Emphysema, unspecified: Secondary | ICD-10-CM

## 2020-09-11 NOTE — Telephone Encounter (Signed)
Assessment and Plan: Pt. 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 You do not have Obstructive Sleep Apnea. We will contact your DME and let them know you also need oxygen at 2 L with sleep. We will place an order for any additional supplies.  They should call to get this set up.  Continue wearing your oxygen at 5 L with exertion RA at rest as long as you oxygen saturations are > 88% If they are below 88% at rest add 2 L to maintain saturations above 88% at all times. ( saturation goal) Continue inhalers and nebulizer treatments as you have been doing. Follow up with Dr. Silas Flood as is scheduled 09/03/2020 Follow up echo as is scheduled. Dr. Silas Flood  wants to see the changes in heart function since your last echo in April.  Please contact office for sooner follow up if symptoms do not improve or worsen or seek emergency care     Order has been placed and sent for [employer] to get the pt set up for oxygen.

## 2020-09-18 ENCOUNTER — Ambulatory Visit (HOSPITAL_COMMUNITY): Payer: Medicare Other | Attending: Cardiology

## 2020-09-18 ENCOUNTER — Other Ambulatory Visit: Payer: Self-pay

## 2020-09-18 DIAGNOSIS — I272 Pulmonary hypertension, unspecified: Secondary | ICD-10-CM

## 2020-09-18 LAB — ECHOCARDIOGRAM COMPLETE
Area-P 1/2: 1.97 cm2
P 1/2 time: 528 msec
S' Lateral: 3.4 cm

## 2020-09-20 ENCOUNTER — Other Ambulatory Visit: Payer: Self-pay | Admitting: Adult Health

## 2020-09-26 ENCOUNTER — Ambulatory Visit: Payer: Medicare Other | Admitting: Pulmonary Disease

## 2020-09-26 ENCOUNTER — Encounter: Payer: Self-pay | Admitting: Pulmonary Disease

## 2020-09-26 ENCOUNTER — Other Ambulatory Visit: Payer: Self-pay

## 2020-09-26 VITALS — BP 118/72 | HR 80 | Temp 98.4°F | Ht 66.0 in | Wt 162.0 lb

## 2020-09-26 DIAGNOSIS — I272 Pulmonary hypertension, unspecified: Secondary | ICD-10-CM

## 2020-09-26 NOTE — Progress Notes (Signed)
@Patient  ID: [name], male    DOB: 1940/08/26, 80 y.o.   MRN: [id number]  Chief Complaint  Patient presents with  . Follow-up    4 month f/u. States he has been using 5L of O2 with exertion. Was told that he needs to use 2L at night.     Referring provider: Burnard Bunting, MD  HPI:   80 year old COPD/emphysema seen in follow up for evaluation of pulmonary hypertension, DOE.  Breathing stable. Walking dog in morning. Treadmill in afternoon but can not go faster than 2.5 mph.   Reviewed recent TTE with persistent LA enlargement and MVR, AI - all mild. Estimated PASP now normal, report of decreased EF 45-50% and mild RV dysfunction both new.    HPI at initial visit:  Patient longstanding history of shortness of breath.  He is need oxygen for some years.  Uses up to 5 L with activity, 3-4 at rest.  His shortness of breath is described as severe.  Worse with exertion.  Somewhat relieved by rest over time.  No positional changes.  Does not seem to change with seasons, environment.  No other alleviating or exacerbating factors.  No history or family history I should say of pulmonary hypertension.  He denies any connective tissue disease.  No arthralgias, arthritis.  Denies Raynaud's phenomenon.  Never taken diet supplements.  Does not use methamphetamine.  No congenital heart disease per his report.  Extensive review of medical record performed today.  Recent high-res CT chest with severe centrilobular and paraseptal emphysema on my review.  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  No evidence of ILD on my review.  TTE performed 11/2019.  This demonstrated mildly dilated LA, diastolic dysfunction, mild aortic regurgitation, mildly dilated RA, mildly enlarged RV with normal RV function, estimated elevated PASP.  Most recent PFTs 06/2020 demonstrated severely reduced DLCO 44% predicted with a surprisingly PFTs, TLC, no evidence of  hyperinflation.  PMH: COPD, enlarged prostate, hyperlipidemia Surgical history: Reviewed in detail, he denies significant surgical history Family history: Memory loss in mother, denies significant pulmonary or respiratory issues and family Social history: Former smoker, 50-pack-year history, quit late 90s, retired   Licensed conveyancer / Pulmonary Flowsheets:   ACT:  No flowsheet data found.  MMRC: mMRC Dyspnea Scale mMRC Score  09/26/2020 0    Epworth:  No flowsheet data found.  Tests:   FENO:  No results found for: NITRICOXIDE  PFT: PFT Results Latest Ref Rng & Units 06/26/2020 04/27/2018  FVC-Pre L 3.75 4.11  FVC-Predicted Pre % 111 119  FVC-Post L 3.85 4.07  FVC-Predicted Post % 113 118  Pre FEV1/FVC % % 78 79  Post FEV1/FCV % % 78 78  FEV1-Pre L 2.92 3.23  FEV1-Predicted Pre % 122 131  FEV1-Post L 3.02 3.20  DLCO uncorrected ml/min/mmHg 9.44 11.81  DLCO UNC% % 44 44  DLCO corrected ml/min/mmHg 9.61 11.92  DLCO COR %Predicted % 44 44  DLVA Predicted % 45 49  TLC L 5.75 5.24  TLC % Predicted % 92 84  RV % Predicted % 87 51  Personally reviewed and interpreted as above  WALK:  No flowsheet data found.  Imaging: Personally reviewed and as per EMR discussion of this note  Lab Results: Personally reviewed, notably eosinophils as high as 400 06/2020 CBC    Component Value Date/Time   WBC 10.6 (H) 06/20/2020 1101   RBC 5.08 06/20/2020 1101   HGB 14.0 06/20/2020  1101   HCT 43.1 06/20/2020 1101   PLT 245.0 06/20/2020 1101   MCV 84.8 06/20/2020 1101   MCH 29.1 12/12/2018 0314   MCHC 32.6 06/20/2020 1101   RDW 14.3 06/20/2020 1101   LYMPHSABS 2.5 06/20/2020 1101   MONOABS 1.0 06/20/2020 1101   EOSABS 0.4 06/20/2020 1101   BASOSABS 0.1 06/20/2020 1101    BMET    Component Value Date/Time   NA 137 06/13/2020 0819   K 4.7 06/13/2020 0819   CL 100 06/13/2020 0819   CO2 26 06/13/2020 0819   GLUCOSE 90 06/13/2020 0819   GLUCOSE 98 12/12/2018 0314   BUN 15  06/13/2020 0819   CREATININE 1.24 06/13/2020 0819   CALCIUM 8.9 06/13/2020 0819   GFRNONAA 55 (L) 06/13/2020 0819   GFRAA 64 06/13/2020 0819    BNP No results found for: BNP  ProBNP No results found for: PROBNP  Specialty Problems      Pulmonary Problems   Dyspnea on exertion   Emphysema lung (HCC)   Hilar adenopathy   Right upper lobe pulmonary nodule      No Known Allergies  Immunization History  Administered Date(s) Administered  . Fluad Quad(high Dose 65+) 04/29/2019, 04/20/2020  . Influenza, High Dose Seasonal PF 05/24/2017, 06/03/2018  . Influenza-Unspecified 06/04/2018, 04/27/2019  . PFIZER(Purple Top)SARS-COV-2 Vaccination 09/25/2019, 10/20/2019, 05/14/2020    Past Medical History:  Diagnosis Date  . Basal cell carcinoma   . Benign prostatic hypertrophy   . Bradycardia 12/06/2019  . Coronary artery calcification seen on CAT scan 05/28/2020  . Dyslipidemia   . Dyspnea on exertion 03/18/2018  . Emphysema lung ([place]) 03/18/2018  . Exudative age-related macular degeneration of left eye with active choroidal neovascularization ([place]) 12/18/2019  . Gallbladder problem   . Gastroesophageal reflux disease   . Hiatal hernia   . Hilar adenopathy 03/25/2018  . History of smoking 03/18/2018  . 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 Macular pucker, right eye 04/30/2020  . Mediastinal adenopathy 03/25/2018  . Memory loss 05/09/2013  . Mild memory disturbance   . Nuclear sclerotic cataract of right eye 12/18/2019  . Posterior vitreous detachment of right eye 12/18/2019  . 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  . Pulmonary hypertension ([place])   . Right upper lobe pulmonary nodule 03/25/2018  . Syncope and collapse 12/12/2018  . TIA (transient ischemic attack)     Tobacco History: Social History   Tobacco Use  Smoking Status Former Smoker  . Packs/day: 2.00  . Years: 25.00  . Pack years: 50.00  . Types: Cigarettes  . Quit date: 67  . Years since quitting: 28.1  Smokeless Tobacco Never Used    Counseling given: Not Answered   Continue to not smoke  Outpatient Encounter Medications as of 09/26/2020  Medication Sig  . albuterol (VENTOLIN HFA) 108 (90 Base) MCG/ACT inhaler Inhale 2 puffs into the lungs every 6 (six) hours as needed for wheezing or shortness of breath.  Marland Kitchen aspirin 81 MG tablet Take 81 mg by mouth daily.   Marland Kitchen atorvastatin (LIPITOR) 20 MG tablet Take 20 mg by mouth daily.  . cholecalciferol (VITAMIN D) 1000 UNITS tablet Take 1,000 Units by mouth daily.  Marland Kitchen donepezil (ARICEPT) 10 MG tablet TAKE 1 TABLET BY MOUTH  DAILY  . Fexofenadine-Pseudoephedrine (ALLEGRA-D 24 HOUR PO) Take 1 tablet by mouth daily.  . finasteride (PROSCAR) 5 MG tablet Take 5 mg by mouth daily.  Marland Kitchen ipratropium (ATROVENT) 0.06 % nasal spray Place 2 sprays into both nostrils daily as needed  for rhinitis.   . Melatonin 10 MG TABS Take 10 mg by mouth at bedtime.  . Multiple Vitamins-Minerals (MULTIVITAMIN ADULTS 50+) TABS Take 1 tablet by mouth daily.   Marland Kitchen omeprazole (PRILOSEC) 40 MG capsule Take 40 mg by mouth daily.   Marland Kitchen thiamine 100 MG tablet Take 1 tablet (100 mg total) by mouth daily.  . traZODone (DESYREL) 100 MG tablet TAKE 2 TABLETS BY MOUTH AT  BEDTIME  . zaleplon (SONATA) 10 MG capsule Take 10 mg by mouth at bedtime as needed.  . nitroGLYCERIN (NITROSTAT) 0.4 MG SL tablet Place 1 tablet (0.4 mg total) under the tongue every 5 (five) minutes as needed. (Patient not taking: Reported on 08/28/2020)   No facility-administered encounter medications on file as of 09/26/2020.     Review of Systems  Review of Systems  No orthopnea or PND, no chest pain with exertion.  Deny significant lower extremity swelling. Comprehensive ROS otherwise negative.  Physical Exam  BP 118/72   Pulse 80   Temp 98.4 F (36.9 C) (Temporal)   Ht 5\' 6"  (1.676 m)   Wt 162 lb (73.5 kg)   SpO2 97% Comment: on RA  BMI 26.15 kg/m   Wt Readings from Last 5 Encounters:  09/26/20 162 lb (73.5 kg)  07/04/20 161 lb (73  kg)  07/03/20 164 lb 6.4 oz (74.6 kg)  06/19/20 165 lb 6.4 oz (75 kg)  06/14/20 164 lb (74.4 kg)    BMI Readings from Last 5 Encounters:  09/26/20 26.15 kg/m  07/04/20 25.99 kg/m  07/03/20 26.53 kg/m  06/19/20 26.70 kg/m  06/14/20 26.47 kg/m     Physical Exam General: Well-appearing, sitting in chair Eyes: EOMI, no icterus Neck: Supple, no JVP appreciated Respiratory: Diminished throughout, normal work of breathing, no wheeze Cardiovascular: Regular rhythm, no murmur Abdomen: Nondistended, bowel sounds present MSK: No synovitis, no effusion Neuro: Normal gait, no weakness Psych: Normal mood, anxious   Assessment & Plan:   Dyspnea exertion: Likely multifactorial.  Related to emphysema, suspected dynamic gas trapping although none demonstrated on the recent pulmonary function test.  Component of deconditioning.  Likely contribution from presumed pulmonary hypertension.  Please see below.  Pulmonary hypertension, presumed: Based on TTE most recently 11/2019.  No right heart cath in the past.  Suspect multifactorial.  Likely component of Group 2 disease given dilated LA, aortic valve insufficiency, diastolic dysfunction.  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 VC scan negative. No OSA. TTE 09/2020 with improved now normal PASP but new development mildly reduced RV function. New mild reduced LV function as well. In process of obtaining nocturnal O2. Repeat TTE in 3 months. Consider RHC after/based on results.   Emphysema: significant on CT scan. Spirometry WNL, no gas trapping, DLCO reduced. Consider addition of long acting bronchodilators. He is going to time albuterol prior to exertion to see if helps.   Return in about 3 months (around 12/24/2020).   Lanier Clam, MD 09/26/2020

## 2020-09-26 NOTE — Patient Instructions (Addendum)
We will work on getting the oxygen at night - this is important (obviously)  I will send Dr. Estill Bamberg a message  Let's plan on a new ultrasound and visit with me in May after being on the oxygen at night and see how the heart responds.    Return to clinic in 3 months after heart ultrasound to discuss with Dr. Silas Flood

## 2020-09-27 ENCOUNTER — Other Ambulatory Visit: Payer: Self-pay | Admitting: Physician Assistant

## 2020-09-27 DIAGNOSIS — R1319 Other dysphagia: Secondary | ICD-10-CM

## 2020-10-01 ENCOUNTER — Ambulatory Visit
Admission: RE | Admit: 2020-10-01 | Discharge: 2020-10-01 | Disposition: A | Discharge status: Home / Self Care | Payer: Medicare Other | Source: Ambulatory Visit | Attending: Physician Assistant | Admitting: Physician Assistant

## 2020-10-01 ENCOUNTER — Other Ambulatory Visit: Payer: Self-pay | Admitting: Physician Assistant

## 2020-10-01 DIAGNOSIS — R1319 Other dysphagia: Secondary | ICD-10-CM

## 2020-10-04 ENCOUNTER — Other Ambulatory Visit: Payer: Self-pay

## 2020-10-04 DIAGNOSIS — J9610 Chronic respiratory failure, unspecified whether with hypoxia or hypercapnia: Secondary | ICD-10-CM

## 2020-10-04 DIAGNOSIS — R634 Abnormal weight loss: Secondary | ICD-10-CM

## 2020-10-04 DIAGNOSIS — K648 Other hemorrhoids: Secondary | ICD-10-CM

## 2020-10-04 DIAGNOSIS — Z9981 Dependence on supplemental oxygen: Secondary | ICD-10-CM

## 2020-10-04 DIAGNOSIS — R198 Other specified symptoms and signs involving the digestive system and abdomen: Secondary | ICD-10-CM | POA: Insufficient documentation

## 2020-10-04 DIAGNOSIS — Z860100 Personal history of colon polyps, unspecified: Secondary | ICD-10-CM

## 2020-10-04 DIAGNOSIS — K529 Noninfective gastroenteritis and colitis, unspecified: Secondary | ICD-10-CM

## 2020-10-04 DIAGNOSIS — J9611 Chronic respiratory failure with hypoxia: Secondary | ICD-10-CM | POA: Insufficient documentation

## 2020-10-04 DIAGNOSIS — R194 Change in bowel habit: Secondary | ICD-10-CM

## 2020-10-04 HISTORY — DX: Dependence on supplemental oxygen: Z99.81

## 2020-10-04 HISTORY — DX: Change in bowel habit: R19.4

## 2020-10-04 HISTORY — DX: Other hemorrhoids: K64.8

## 2020-10-04 HISTORY — DX: Chronic respiratory failure, unspecified whether with hypoxia or hypercapnia: J96.10

## 2020-10-04 HISTORY — DX: Personal history of colonic polyps: Z86.010

## 2020-10-04 HISTORY — DX: Other specified symptoms and signs involving the digestive system and abdomen: R19.8

## 2020-10-04 HISTORY — DX: Personal history of colon polyps, unspecified: Z86.0100

## 2020-10-04 HISTORY — DX: Noninfective gastroenteritis and colitis, unspecified: K52.9

## 2020-10-04 HISTORY — DX: Abnormal weight loss: R63.4

## 2020-10-15 ENCOUNTER — Ambulatory Visit: Payer: Medicare Other | Admitting: Cardiology

## 2020-10-15 ENCOUNTER — Encounter: Payer: Self-pay | Admitting: Cardiology

## 2020-10-15 ENCOUNTER — Other Ambulatory Visit: Payer: Self-pay

## 2020-10-15 VITALS — BP 112/68 | HR 93 | Ht 66.5 in | Wt 165.0 lb

## 2020-10-15 DIAGNOSIS — Z9981 Dependence on supplemental oxygen: Secondary | ICD-10-CM | POA: Diagnosis not present

## 2020-10-15 DIAGNOSIS — E785 Hyperlipidemia, unspecified: Secondary | ICD-10-CM

## 2020-10-15 DIAGNOSIS — R0600 Dyspnea, unspecified: Secondary | ICD-10-CM | POA: Diagnosis not present

## 2020-10-15 DIAGNOSIS — I272 Pulmonary hypertension, unspecified: Secondary | ICD-10-CM

## 2020-10-15 DIAGNOSIS — I2510 Atherosclerotic heart disease of native coronary artery without angina pectoris: Secondary | ICD-10-CM | POA: Diagnosis not present

## 2020-10-15 DIAGNOSIS — R0609 Other forms of dyspnea: Secondary | ICD-10-CM

## 2020-10-15 NOTE — Patient Instructions (Signed)
Medication Instructions:  Your physician recommends that you continue on your current medications as directed. Please refer to the Current Medication list given to you today.  *If you need a refill on your cardiac medications before your next appointment, please call your pharmacy*   Lab Work: Your physician recommends that you return for lab work today: bmp , pro bnp  If you have labs (blood work) drawn today and your tests are completely normal, you will receive your results only by: . MyChart Message (if you have MyChart) OR . A paper copy in the mail If you have any lab test that is abnormal or we need to change your treatment, we will call you to review the results.   Testing/Procedures: None   Follow-Up: At [facility], you and your health needs are our priority.  As part of our continuing mission to provide you with exceptional heart care, we have created designated Provider Care Teams.  These Care Teams include your primary Cardiologist (physician) and Advanced Practice Providers (APPs -  Physician Assistants and Nurse Practitioners) who all work together to provide you with the care you need, when you need it.  We recommend signing up for the patient portal called "MyChart".  Sign up information is provided on this After Visit Summary.  MyChart is used to connect with patients for Virtual Visits (Telemedicine).  Patients are able to view lab/test results, encounter notes, upcoming appointments, etc.  Non-urgent messages can be sent to your provider as well.   To learn more about what you can do with MyChart, go to [email or web address].    Your next appointment:   3 month(s)  The format for your next appointment:   In Person  Provider:   Robert Krasowski, MD   Other Instructions   

## 2020-10-15 NOTE — Progress Notes (Signed)
Cardiology Office Note:    Date:  10/15/2020   ID:  Frank Meyer, DOB 1941-01-17, MRN [id number]  PCP:  Burnard Bunting, MD  Cardiologist:  Jenne Campus, MD    Referring MD: Burnard Bunting, MD   Chief Complaint  Patient presents with  . Follow-up  Doing better with oxygen  History of Present Illness:    Frank Meyer is a 80 y.o. male with past medical history significant for smoking, dyslipidemia, bradycardia, calcification noted on coronary arteries however he coronary CT angio after that showed less than 50% lesions. His calcium score was 400. Fractional flow reserve done thereafter did not show any significant hemodynamically lesions. Concern was the fact that his echocardiogram showed pulmonary hypertension and we concentrated our efforts management of this problem. He complained of having some shortness of breath. 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 Recently he had repeated echocardiogram done which showed no elevation of pulmonary pressure, however, showed decrease in left ventricle ejection fraction. Obviously that is very concerning. Overall he said with oxygen he can walk more however when he goes on the treadmill and try to walk very quickly the desaturates. Denies have any chest pain tightness squeezing pressure burning chest.  Past Medical History:  Diagnosis Date  . Altered bowel habits 10/04/2020  . Basal cell carcinoma   . Benign prostatic hypertrophy   . Bradycardia 12/06/2019  . Chronic respiratory failure ([place]) 10/04/2020  . Coronary artery calcification seen on CAT scan 05/28/2020  . Dyslipidemia   . Dyspnea on exertion 03/18/2018  . Emphysema lung ([place]) 03/18/2018  . Exudative age-related  macular degeneration of left eye with active choroidal neovascularization ([place]) 12/18/2019  . Gallbladder problem   . Gastroesophageal reflux disease   . Hiatal hernia   . Hilar adenopathy 03/25/2018  . History of smoking 03/18/2018  . 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  . Internal hemorrhoid 10/04/2020  . Macular pucker, right eye 04/30/2020  . Mediastinal adenopathy 03/25/2018  . Memory loss 05/09/2013  . Mild memory disturbance   . Nuclear sclerotic cataract of right eye 12/18/2019  . Other specified symptoms and signs involving the digestive system and abdomen 10/04/2020  . Oxygen dependent 10/04/2020  . Personal history of colonic polyps 10/04/2020  . Posterior vitreous detachment of right eye 12/18/2019  . 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  . Pulmonary hypertension ([place])   . Right upper lobe pulmonary nodule 03/25/2018  . Syncope and collapse 12/12/2018  . TIA (transient ischemic attack)   . Weight decreased 10/04/2020    Past Surgical History:  Procedure Laterality Date  . none      Current Medications: Current Meds  Medication Sig  . albuterol (VENTOLIN HFA) 108 (90 Base) MCG/ACT inhaler Inhale 2 puffs into the lungs every 6 (six) hours as needed for wheezing or shortness of breath.  Marland Kitchen aspirin 81 MG EC tablet Take 1 tablet by mouth in the morning and at bedtime.  Marland Kitchen atorvastatin (LIPITOR) 20 MG tablet Take 20 mg by mouth daily.  . cholecalciferol (VITAMIN D) 1000 UNITS tablet Take 1,000 Units by mouth daily.  Marland Kitchen donepezil (ARICEPT) 10 MG tablet TAKE 1 TABLET BY MOUTH  DAILY  .  Fexofenadine-Pseudoephedrine (ALLEGRA-D 24 HOUR PO) Take 1 tablet by mouth daily.  . finasteride (PROSCAR) 5 MG tablet Take 5 mg by mouth daily.  Marland Kitchen ipratropium (ATROVENT) 0.06 % nasal spray Place 2 sprays into both nostrils daily as needed for rhinitis.   . Melatonin 10 MG TABS Take 10 mg by mouth at bedtime.  . nitroGLYCERIN (NITROSTAT) 0.4  MG SL tablet Place 1 tablet (0.4 mg total) under the tongue every 5 (five) minutes as needed.  Marland Kitchen omeprazole (PRILOSEC) 40 MG capsule Take 40 mg by mouth daily.   . OXYGEN Inhale into the lungs. 5lt during the day and 2lt at night  . thiamine 100 MG tablet Take 1 tablet (100 mg total) by mouth daily.  . traZODone (DESYREL) 100 MG tablet TAKE 2 TABLETS BY MOUTH AT  BEDTIME  . zaleplon (SONATA) 10 MG capsule Take 10 mg by mouth at bedtime as needed.  . [DISCONTINUED] Multiple Vitamins-Minerals (MULTIVITAMIN ADULTS 50+) TABS Take 1 tablet by mouth daily.      Allergies:   Patient has no known allergies.   Social History   Socioeconomic History  . Marital status: Married    Spouse name: pamela  . Number of children: 2  . Years of education: college  . Highest education level: Not on file  Occupational History  . Occupation: retired  Tobacco Use  . Smoking status: Former Smoker    Packs/day: 2.00    Years: 25.00    Pack years: 50.00    Types: Cigarettes    Quit date: 1994    Years since quitting: 28.1  . Smokeless tobacco: Never Used  Substance and Sexual Activity  . Alcohol use: Yes    Alcohol/week: 15.0 - 20.0 standard drinks    Types: 15 - 20 Cans of beer per week  . Drug use: Never  . Sexual activity: Not on file  Other Topics Concern  . Not on file  Social History Narrative   Pt is retired. He is married. The pt lives with his wife. He has a Best boy. The pt has 2 children.      Patient drinks 2-3 cups of caffeine daily.   Patient is right handed.    Social Determinants of Health   Financial Resource Strain: Not on file  Food Insecurity: Not on file  Transportation Needs: Not on file  Physical Activity: Not on file  Stress: Not on file  Social Connections: Not on file     Family History: The patient's family history includes Memory loss in his mother; Obesity in his mother. ROS:   Please see the history of present illness.    All 14 point review of  systems negative except as described per history of present illness  EKGs/Labs/Other Studies Reviewed:      Recent Labs: 06/13/2020: ALT 20; BUN 15; Creatinine, Ser 1.24; Potassium 4.7; Sodium 137 06/20/2020: Hemoglobin 14.0; Platelets 245.0  Recent Lipid Panel    Component Value Date/Time   CHOL 147 06/13/2020 0819   TRIG 129 06/13/2020 0819   HDL 57 06/13/2020 0819   CHOLHDL 2.6 06/13/2020 0819   LDLCALC 67 06/13/2020 0819    Physical Exam:    VS:  BP 112/68 (BP Location: Right Arm, Patient Position: Sitting)   Pulse 93   Ht 5' 6.5" (1.689 m)   Wt 165 lb (74.8 kg)   SpO2 96%   BMI 26.23 kg/m     Wt Readings from Last 3 Encounters:  10/15/20 165 lb (74.8 kg)  09/26/20 162 lb (73.5 kg)  07/04/20 161 lb (73 kg)     GEN:  Well nourished, well developed in no acute distress HEENT: Normal NECK: No JVD; No carotid bruits LYMPHATICS: No lymphadenopathy CARDIAC: RRR, no murmurs, no rubs, no gallops RESPIRATORY:  Clear to auscultation without rales, wheezing or rhonchi  ABDOMEN: Soft, non-tender, non-distended MUSCULOSKELETAL:  No edema; No deformity  SKIN: Warm and dry LOWER EXTREMITIES: no swelling NEUROLOGIC:  Alert and oriented x 3 PSYCHIATRIC:  Normal affect   ASSESSMENT:    1. Dyspnea on exertion   2. Pulmonary hypertension ([place])   3. Coronary artery calcification seen on CAT scan nonobstructive by coronary CT angio and FFR   4. Oxygen dependent   5. Dyslipidemia    PLAN:    In order of problems listed above:  1. Pulmonary hypertension I suspect majority of this is group 3 however component of group 2 can be still there. On top of that now he also have systolic dysfunction. Plan will be I will check his Chem-7 as well as proBNP today. Based on that I will augment his therapy for cardiomyopathy. I will put him most likely on Entresto if that will not result in improvement left ventricle ejection fraction I will consider doing left and right cardiac  catheterization to clarify exactly the nature of his pulmonary hypertension looking at pulmonary wedge pressure as well as looking at his coronaries. 2. Coronary calcification fractional flow reserve did not show any significant stenosis on latest study. 3. Oxygen dependence. That being followed by pulmonary. 4. Dyslipidemia: He is taking Lipitor 20 I did review his K PN from 06/13/2020 which showed LDL of 67 HDL 57. We will continue that management.   Medication Adjustments/Labs and Tests Ordered: Current medicines are reviewed at length with the patient today.  Concerns regarding medicines are outlined above.  Orders Placed This Encounter  Procedures  . Basic metabolic panel  . Pro b natriuretic peptide (BNP)   Medication changes: No orders of the defined types were placed in this encounter.   Signed, Park Liter, MD, [facility] 10/15/2020 4:30 PM    [place]

## 2020-10-16 ENCOUNTER — Telehealth: Payer: Self-pay | Admitting: Emergency Medicine

## 2020-10-16 DIAGNOSIS — Z79899 Other long term (current) drug therapy: Secondary | ICD-10-CM

## 2020-10-16 LAB — BASIC METABOLIC PANEL
BUN/Creatinine Ratio: 17 (ref 10–24)
BUN: 20 mg/dL (ref 8–27)
CO2: 22 mmol/L (ref 20–29)
Calcium: 8.9 mg/dL (ref 8.6–10.2)
Chloride: 103 mmol/L (ref 96–106)
Creatinine, Ser: 1.19 mg/dL (ref 0.76–1.27)
Glucose: 140 mg/dL — ABNORMAL HIGH (ref 65–99)
Potassium: 5.1 mmol/L (ref 3.5–5.2)
Sodium: 139 mmol/L (ref 134–144)
eGFR: 62 mL/min/{1.73_m2} (ref >59)

## 2020-10-16 LAB — PRO B NATRIURETIC PEPTIDE: NT-Pro BNP: 223 pg/mL (ref 0–486)

## 2020-10-16 MED ORDER — ENTRESTO 24-26 MG PO TABS: 1.0000 | ORAL_TABLET | Freq: Two times a day (BID) | ORAL | 2 refills | Status: DC

## 2020-10-16 NOTE — Telephone Encounter (Signed)
-----   Message from Park Liter, MD sent at 10/16/2020 12:11 PM EST ----- Start Delene Loll 24/26 twice daily, Chem-7 need to be repeated on Monday

## 2020-10-16 NOTE — Telephone Encounter (Signed)
Called patient informed him of results. He will start entresto 24/26 mg twice daily and have labs redrawn Monday. No further questions.

## 2020-10-21 ENCOUNTER — Telehealth: Payer: Self-pay | Admitting: Cardiology

## 2020-10-21 NOTE — Telephone Encounter (Signed)
Patient came in asking about the cost of his Frank Meyer, states he was suppose to get his first RX for free. Wanted to speak with a nurse, please call [phone]

## 2020-10-21 NOTE — Telephone Encounter (Signed)
Called patient. Gave him patient assistance number he will call them and see if he can get approved, he will call if not successful.

## 2020-10-22 LAB — BASIC METABOLIC PANEL
BUN/Creatinine Ratio: 19 (ref 10–24)
BUN: 24 mg/dL (ref 8–27)
CO2: 23 mmol/L (ref 20–29)
Calcium: 8.8 mg/dL (ref 8.6–10.2)
Chloride: 96 mmol/L (ref 96–106)
Creatinine, Ser: 1.29 mg/dL — ABNORMAL HIGH (ref 0.76–1.27)
Glucose: 108 mg/dL — ABNORMAL HIGH (ref 65–99)
Potassium: 5.1 mmol/L (ref 3.5–5.2)
Sodium: 131 mmol/L — ABNORMAL LOW (ref 134–144)
eGFR: 56 mL/min/{1.73_m2} — ABNORMAL LOW (ref >59)

## 2020-10-23 ENCOUNTER — Telehealth: Payer: Self-pay | Admitting: Emergency Medicine

## 2020-10-23 DIAGNOSIS — R7989 Other specified abnormal findings of blood chemistry: Secondary | ICD-10-CM

## 2020-10-23 NOTE — Telephone Encounter (Signed)
Called patient informed him of results. He will repeat labs in 1 week.

## 2020-10-23 NOTE — Telephone Encounter (Signed)
-----   Message from Park Liter, MD sent at 10/23/2020 12:59 PM EST ----- There is a little increase in creatinine but still acceptable.  Please continue present management, make arrangements for a Chem-7 within the next 7 days

## 2020-10-29 ENCOUNTER — Ambulatory Visit (INDEPENDENT_AMBULATORY_CARE_PROVIDER_SITE_OTHER): Payer: Medicare Other | Admitting: Ophthalmology

## 2020-10-29 ENCOUNTER — Other Ambulatory Visit: Payer: Self-pay

## 2020-10-29 ENCOUNTER — Encounter (INDEPENDENT_AMBULATORY_CARE_PROVIDER_SITE_OTHER): Payer: Self-pay | Admitting: Ophthalmology

## 2020-10-29 DIAGNOSIS — H2511 Age-related nuclear cataract, right eye: Secondary | ICD-10-CM | POA: Diagnosis not present

## 2020-10-29 DIAGNOSIS — H353112 Nonexudative age-related macular degeneration, right eye, intermediate dry stage: Secondary | ICD-10-CM

## 2020-10-29 DIAGNOSIS — H35371 Puckering of macula, right eye: Secondary | ICD-10-CM

## 2020-10-29 DIAGNOSIS — H353221 Exudative age-related macular degeneration, left eye, with active choroidal neovascularization: Secondary | ICD-10-CM

## 2020-10-29 LAB — BASIC METABOLIC PANEL
BUN/Creatinine Ratio: 13 (ref 10–24)
BUN: 15 mg/dL (ref 8–27)
CO2: 25 mmol/L (ref 20–29)
Calcium: 8.6 mg/dL (ref 8.6–10.2)
Chloride: 102 mmol/L (ref 96–106)
Creatinine, Ser: 1.18 mg/dL (ref 0.76–1.27)
Glucose: 94 mg/dL (ref 65–99)
Potassium: 4.6 mmol/L (ref 3.5–5.2)
Sodium: 139 mmol/L (ref 134–144)
eGFR: 63 mL/min/{1.73_m2} (ref >59)

## 2020-10-29 MED ORDER — BEVACIZUMAB 2.5 MG/0.1ML IZ SOSY
2.5000 mg | PREFILLED_SYRINGE | INTRAVITREAL | Status: AC | PRN
  Administered 2020-10-29: 2.5 mg via INTRAVITREAL

## 2020-10-29 NOTE — Progress Notes (Signed)
10/29/2020     CHIEF COMPLAINT Patient presents for Retina Follow Up (3 Mo F/U OU, poss Avastin OS//Pt sts reading VA OU has been "slipping a little." Pt denies changes to distance VA OU. No other new symptoms reported OU.)   HISTORY OF PRESENT ILLNESS: Frank Meyer is a 80 y.o. 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  This started 3 months ago.  Severity is mild.  Duration of 3 months.  Since onset it is stable. Additional comments: 3 Mo F/U OU, poss Avastin OS  Pt sts reading VA OU has been "slipping a little." Pt denies changes to distance VA OU. No other new symptoms reported OU.       Last edited by Rockie Neighbours, [place] on 10/29/2020 10:03 AM. (History)      Referring physician: Burnard Bunting, MD [place],  [place] [address]  HISTORICAL INFORMATION:   Selected notes from the [facility]: No current outpatient medications on file. (Ophthalmic Drugs)   No current facility-administered medications for this visit. (Ophthalmic Drugs)   Current Outpatient Medications (Other)  Medication Sig  . albuterol (VENTOLIN HFA) 108 (90 Base) MCG/ACT inhaler Inhale 2 puffs into the lungs every 6 (six) hours as needed for wheezing or shortness of breath.  Marland Kitchen aspirin 81 MG EC tablet Take 1 tablet by mouth in the morning and at bedtime.  Marland Kitchen atorvastatin (LIPITOR) 20 MG tablet Take 20 mg by mouth daily.  . cholecalciferol (VITAMIN D) 1000 UNITS tablet Take 1,000 Units by mouth daily.  Marland Kitchen donepezil (ARICEPT) 10 MG tablet TAKE 1 TABLET BY MOUTH  DAILY  . Fexofenadine-Pseudoephedrine (ALLEGRA-D 24 HOUR PO) Take 1 tablet by mouth daily.  . finasteride (PROSCAR) 5 MG tablet Take 5 mg by mouth daily.  Marland Kitchen ipratropium (ATROVENT) 0.06 % nasal spray Place 2 sprays into both nostrils daily as needed for rhinitis.   . Melatonin 10 MG TABS Take 10 mg by mouth at bedtime.   . nitroGLYCERIN (NITROSTAT) 0.4 MG SL tablet Place 1 tablet (0.4 mg total) under the tongue every 5 (five) minutes as needed.  Marland Kitchen omeprazole (PRILOSEC) 40 MG capsule Take 40 mg by mouth daily.   . OXYGEN Inhale into the lungs. 5lt during the day and 2lt at night  . sacubitril-valsartan (ENTRESTO) 24-26 MG Take 1 tablet by mouth 2 (two) times daily. ENTRESTO FREE MONTH CARD RXBIN [id number] RXGRP [id number] RXPCN [facility]  ID [id number]  . thiamine 100 MG tablet Take 1 tablet (100 mg total) by mouth daily.  . traZODone (DESYREL) 100 MG tablet TAKE 2 TABLETS BY MOUTH AT  BEDTIME  . zaleplon (SONATA) 10 MG capsule Take 10 mg by mouth at bedtime as needed.   No current facility-administered medications for this visit. (Other)      REVIEW OF SYSTEMS:    ALLERGIES No Known Allergies  PAST MEDICAL HISTORY Past Medical History:  Diagnosis Date  . Altered bowel habits 10/04/2020  . Basal cell carcinoma   . Benign prostatic hypertrophy   . Bradycardia 12/06/2019  . Chronic respiratory failure ([place]) 10/04/2020  . Coronary artery calcification seen on CAT scan 05/28/2020  . Dyslipidemia   . Dyspnea on exertion 03/18/2018  . Emphysema lung ([place]) 03/18/2018  . Exudative age-related macular degeneration of left eye with active choroidal neovascularization ([place])  12/18/2019  . Gallbladder problem   . Gastroesophageal reflux disease   . Hiatal hernia   . Hilar adenopathy 03/25/2018  . History of smoking 03/18/2018  . 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  . Internal hemorrhoid 10/04/2020  . Macular pucker, right eye 04/30/2020  . Mediastinal adenopathy 03/25/2018  . Memory loss 05/09/2013  . Mild memory disturbance   . Nuclear sclerotic cataract of right eye 12/18/2019  . Other specified symptoms and signs involving the digestive system and abdomen 10/04/2020  . Oxygen dependent 10/04/2020  . Personal history of colonic polyps 10/04/2020  .  Posterior vitreous detachment of right eye 12/18/2019  . 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  . Pulmonary hypertension ([place])   . Right upper lobe pulmonary nodule 03/25/2018  . Syncope and collapse 12/12/2018  . TIA (transient ischemic attack)   . Weight decreased 10/04/2020   Past Surgical History:  Procedure Laterality Date  . none      FAMILY HISTORY Family History  Problem Relation Age of Onset  . Obesity Mother   . Memory loss Mother     SOCIAL HISTORY Social History   Tobacco Use  . Smoking status: Former Smoker    Packs/day: 2.00    Years: 25.00    Pack years: 50.00    Types: Cigarettes    Quit date: 1994    Years since quitting: 28.2  . Smokeless tobacco: Never Used  Substance Use Topics  . Alcohol use: Yes    Alcohol/week: 15.0 - 20.0 standard drinks    Types: 15 - 20 Cans of beer per week  . Drug use: Never         OPHTHALMIC EXAM:  Base Eye Exam    Visual Acuity (ETDRS)      Right Left   Dist [place] 20/30 +2 20/20   Dist ph [place] 20/25 -2        Tonometry (Tonopen, 10:03 AM)      Right Left   Pressure 13 12       Pupils      Pupils Dark Light Shape React APD   Right PERRL 3.5 2.5 Round Brisk None   Left PERRL 3.5 2.5 Round Brisk None       Visual Fields (Counting fingers)      Left Right    Full Full       Extraocular Movement      Right Left    Full Full       Neuro/Psych    Oriented x3: Yes   Mood/Affect: Normal       Dilation    Both eyes: 1.0% Mydriacyl, 2.5% Phenylephrine @ 10:07 AM        Slit Lamp and Fundus Exam    External Exam      Right Left   External Normal Normal       Slit Lamp Exam      Right Left   Lids/Lashes Normal Normal   Conjunctiva/Sclera White and quiet White and quiet   Cornea Clear Clear   Anterior Chamber Deep and quiet Deep and quiet   Iris Round and reactive Round and reactive   Lens 2+ Nuclear sclerosis Posterior chamber intraocular lens   Anterior Vitreous Normal Normal       Fundus Exam       Right Left   Posterior Vitreous  Posterior vitreous detachment   Disc  Normal   C/D Ratio  0.1  Macula  Retinal pigment epithelial mottling, Early age related macular degeneration, no hemorrhage, Hard drusen, no macular thickening, no exudates, Geographic atrophy   Vessels  Normal   Periphery  Normal          IMAGING AND PROCEDURES  Imaging and Procedures for 10/29/20  OCT, Retina - OU - Both Eyes       Right Eye Quality was good. Scan locations included subfoveal. Central Foveal Thickness: 305. Progression has been stable. Findings include no SRF, no IRF, epiretinal membrane.   Left Eye Quality was good. Scan locations included subfoveal. Central Foveal Thickness: 302. Progression has improved. Findings include retinal drusen , no IRF, no SRF, subretinal hyper-reflective material.   Notes OD, dry ARMD, minor epiretinal membrane on distorting  OS, subretinal hyper reflective material in the region of previous active CNVM, fibrosis, stable at 92-month follow-up post Avastin, repeat today.  Onset October 2020 vastly improved macular anatomy and acuity       Intravitreal Injection, Pharmacologic Agent - OS - Left Eye       Time Out 10/29/2020. 10:53 AM. Confirmed correct patient, procedure, site, and patient consented.   Anesthesia Topical anesthesia was used. Anesthetic medications included [place] 3.5%.   Procedure Preparation included 10% betadine to eyelids, 5% betadine to ocular surface, Tobramycin 0.3%. A 30 gauge needle was used.   Injection:  2.5 mg Bevacizumab (AVASTIN) 2.5mg /0.45mL SOSY   NDC: 29798-921-19, Lot: [id number]   Route: Intravitreal, Site: Left Eye  Post-op Post injection exam found visual acuity of at least counting fingers. The patient tolerated the procedure well. There were no complications. The patient received written and verbal post procedure care education. Post injection medications were not given.                  ASSESSMENT/PLAN:  Exudative age-related macular degeneration of left eye with active choroidal neovascularization ([place]) Vastly improved macular anatomy compared to onset of lesion, CNVM.  Currently 81-month follow-up.  We will repeat injection today for maintenance as patient has underlying bodily hypoxia from cor pulmonale/history of COPD, on full-time oxygen  Macular pucker, right eye Minor OD  Nuclear sclerotic cataract of right eye Progression OD, patient is not troubled by the vision in the right eye  Intermediate stage nonexudative age-related macular degeneration of right eye Stable no CNVM      ICD-10-CM   1. Exudative age-related macular degeneration of left eye with active choroidal neovascularization (H[facility])  H35.3221 OCT, Retina - OU - Both Eyes    Intravitreal Injection, Pharmacologic Agent - OS - Left Eye    bevacizumab (AVASTIN) SOSY 2.5 mg  2. Macular pucker, right eye  H35.371   3. Nuclear sclerotic cataract of right eye  H25.11   4. Intermediate stage nonexudative age-related macular degeneration of right eye  H35.3112     1.  We will repeat injection Avastin OS today to maintain excellent outcome as compared to onset October 2020  2.  No signs of CNVM OD  3.  Dilate OU in 3 months  Ophthalmic Meds Ordered this visit:  Meds ordered this encounter  Medications  . bevacizumab (AVASTIN) SOSY 2.5 mg       Return in about 3 months (around 01/29/2021) for DILATE OU, AVASTIN OCT, OS.  There are no Patient Instructions on file for this visit.   Explained the diagnoses, plan, and follow up with the patient and they expressed understanding.  Patient expressed understanding of the importance of proper follow up care.  Clent Demark Vannary Greening M.D. Diseases & Surgery of the Retina and Vitreous Retina & Diabetic [place] 10/29/20     Abbreviations: M myopia (nearsighted); A astigmatism; H hyperopia (farsighted); P presbyopia; Mrx spectacle prescription;  CTL  contact lenses; OD right eye; OS left eye; OU both eyes  XT exotropia; ET esotropia; PEK punctate epithelial keratitis; PEE punctate epithelial erosions; DES dry eye syndrome; MGD meibomian gland dysfunction; ATs artificial tears; PFAT's preservative free artificial tears; [place] nuclear sclerotic cataract; PSC posterior subcapsular cataract; ERM epi-retinal membrane; PVD posterior vitreous detachment; RD retinal detachment; DM diabetes mellitus; DR diabetic retinopathy; NPDR non-proliferative diabetic retinopathy; PDR proliferative diabetic retinopathy; CSME clinically significant macular edema; DME diabetic macular edema; dbh dot blot hemorrhages; CWS cotton wool spot; POAG primary open angle glaucoma; C/D cup-to-disc ratio; HVF humphrey visual field; GVF goldmann visual field; OCT optical coherence tomography; IOP intraocular pressure; BRVO Branch retinal vein occlusion; CRVO central retinal vein occlusion; CRAO central retinal artery occlusion; BRAO branch retinal artery occlusion; RT retinal tear; SB scleral buckle; PPV pars plana vitrectomy; VH Vitreous hemorrhage; PRP panretinal laser photocoagulation; IVK intravitreal kenalog; VMT vitreomacular traction; MH Macular hole;  NVD neovascularization of the disc; NVE neovascularization elsewhere; AREDS age related eye disease study; ARMD age related macular degeneration; POAG primary open angle glaucoma; EBMD epithelial/anterior basement membrane dystrophy; ACIOL anterior chamber intraocular lens; IOL intraocular lens; PCIOL posterior chamber intraocular lens; Phaco/IOL phacoemulsification with intraocular lens placement; [place] photorefractive keratectomy; LASIK laser assisted in situ keratomileusis; HTN hypertension; DM diabetes mellitus; COPD chronic obstructive pulmonary disease

## 2020-10-29 NOTE — Assessment & Plan Note (Signed)
Progression OD, patient is not troubled by the vision in the right eye

## 2020-10-29 NOTE — Assessment & Plan Note (Signed)
Minor OD 

## 2020-10-29 NOTE — Assessment & Plan Note (Signed)
Stable no CNVM 

## 2020-10-29 NOTE — Assessment & Plan Note (Signed)
Vastly improved macular anatomy compared to onset of lesion, CNVM.  Currently 57-month follow-up.  We will repeat injection today for maintenance as patient has underlying bodily hypoxia from cor pulmonale/history of COPD, on full-time oxygen

## 2020-12-13 ENCOUNTER — Ambulatory Visit: Payer: Medicare Other | Admitting: Pulmonary Disease

## 2020-12-17 ENCOUNTER — Telehealth: Payer: Self-pay | Admitting: Cardiology

## 2020-12-17 MED ORDER — ENTRESTO 24-26 MG PO TABS: 1.0000 | ORAL_TABLET | Freq: Two times a day (BID) | ORAL | 1 refills | Status: DC

## 2020-12-17 NOTE — Telephone Encounter (Signed)
Medication filled.  

## 2020-12-17 NOTE — Telephone Encounter (Signed)
*  STAT* If patient is at the pharmacy, call can be transferred to refill team.   1. Which medications need to be refilled? (please list name of each medication and dose if known) sacubitril-valsartan (ENTRESTO) 24-26 MG  2. Which pharmacy/location (including street and city if local pharmacy) is medication to be sent to? [facility], [address], [address]  3. Do they need a 30 day or 90 day supply? 90 day supply  [place] all medication refills should   be sent to [facility] per patient

## 2021-01-01 ENCOUNTER — Other Ambulatory Visit: Payer: Self-pay

## 2021-01-01 ENCOUNTER — Ambulatory Visit (HOSPITAL_COMMUNITY): Payer: Medicare Other | Attending: Internal Medicine

## 2021-01-01 DIAGNOSIS — I272 Pulmonary hypertension, unspecified: Secondary | ICD-10-CM

## 2021-01-01 LAB — ECHOCARDIOGRAM COMPLETE
Area-P 1/2: 2.14 cm2
P 1/2 time: 827 msec
S' Lateral: 3.8 cm

## 2021-01-07 ENCOUNTER — Encounter: Payer: Self-pay | Admitting: Pulmonary Disease

## 2021-01-07 ENCOUNTER — Other Ambulatory Visit: Payer: Self-pay

## 2021-01-07 ENCOUNTER — Ambulatory Visit: Payer: Medicare Other | Admitting: Pulmonary Disease

## 2021-01-07 VITALS — BP 118/64 | HR 80 | Temp 97.3°F | Ht 66.0 in | Wt 165.0 lb

## 2021-01-07 DIAGNOSIS — J9611 Chronic respiratory failure with hypoxia: Secondary | ICD-10-CM | POA: Diagnosis not present

## 2021-01-07 DIAGNOSIS — J439 Emphysema, unspecified: Secondary | ICD-10-CM

## 2021-01-07 DIAGNOSIS — R0600 Dyspnea, unspecified: Secondary | ICD-10-CM

## 2021-01-07 DIAGNOSIS — R911 Solitary pulmonary nodule: Secondary | ICD-10-CM

## 2021-01-07 DIAGNOSIS — R0609 Other forms of dyspnea: Secondary | ICD-10-CM

## 2021-01-07 NOTE — Patient Instructions (Addendum)
No changes today  I am glad you are enjoying golf  Return for follow up in 6 months with Dr. Silas Flood - or sooner as needed

## 2021-01-07 NOTE — Progress Notes (Signed)
@Patient  ID: Frank Meyer, male    DOB: April 22, 1941, 80 y.o.   MRN: [id number]  Chief Complaint  Patient presents with  . 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 Cardiology note 10/2020 reviewed.  Breathing stable.Doing readmill in afternoon but can not go faster than 2.0 mph. Playing golf twice a week. Enjoying this.   Reviewed recent TTE 12/2020 with improved LV and RV function. PASP estimated normal. This was relayed to patient.   HPI at initial visit:  Patient longstanding history of shortness of breath.  He is need oxygen for some years.  Uses up to 5 L with activity, 3-4 at rest.  His shortness of breath is described as severe.  Worse with exertion.  Somewhat relieved by rest over time.  No positional changes.  Does not seem to change with seasons, environment.  No other alleviating or exacerbating factors.  No history or family history I should say of pulmonary hypertension.  He denies any connective tissue disease.  No arthralgias, arthritis.  Denies Raynaud's phenomenon.  Never taken diet supplements.  Does not use methamphetamine.  No congenital heart disease per his report.  Extensive review of medical record performed today.  Recent high-res CT chest with severe centrilobular and paraseptal emphysema on my review.  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  No evidence of ILD on my review.  TTE performed 11/2019.  This demonstrated mildly dilated LA, diastolic dysfunction, mild aortic regurgitation, mildly dilated RA, mildly enlarged RV with normal RV function, estimated elevated PASP.  Most recent PFTs 06/2020 demonstrated severely reduced DLCO 44% predicted with a surprisingly PFTs, TLC, no evidence of hyperinflation.  PMH: COPD, enlarged prostate, hyperlipidemia Surgical history: Reviewed in  detail, he denies significant surgical history Family history: Memory loss in mother, denies significant pulmonary or respiratory issues and family Social history: Former smoker, 50-pack-year history, quit late 90s, retired   Licensed conveyancer / Pulmonary Flowsheets:   ACT:  No flowsheet data found.  MMRC: mMRC Dyspnea Scale mMRC Score  09/26/2020 0    Epworth:  No flowsheet data found.  Tests:   FENO:  No results found for: NITRICOXIDE  PFT: PFT Results Latest Ref Rng & Units 06/26/2020 04/27/2018  FVC-Pre L 3.75 4.11  FVC-Predicted Pre % 111 119  FVC-Post L 3.85 4.07  FVC-Predicted Post % 113 118  Pre FEV1/FVC % % 78 79  Post FEV1/FCV % % 78 78  FEV1-Pre L 2.92 3.23  FEV1-Predicted Pre % 122 131  FEV1-Post L 3.02 3.20  DLCO uncorrected ml/min/mmHg 9.44 11.81  DLCO UNC% % 44 44  DLCO corrected ml/min/mmHg 9.61 11.92  DLCO COR %Predicted % 44 44  DLVA Predicted % 45 49  TLC L 5.75 5.24  TLC % Predicted % 92 84  RV % Predicted % 87 51  Personally reviewed and interpreted as above  WALK:  No flowsheet data found.  Imaging: Personally reviewed and as per EMR discussion of this note  Lab Results: Personally reviewed, notably eosinophils as high as 400 06/2020 CBC    Component Value Date/Time   WBC 10.6 (H) 06/20/2020 1101   RBC 5.08 06/20/2020 1101   HGB 14.0 06/20/2020 1101   HCT 43.1 06/20/2020 1101   PLT 245.0 06/20/2020 1101   MCV 84.8 06/20/2020 1101   MCH 29.1 12/12/2018 0314  MCHC 32.6 06/20/2020 1101   RDW 14.3 06/20/2020 1101   LYMPHSABS 2.5 06/20/2020 1101   MONOABS 1.0 06/20/2020 1101   EOSABS 0.4 06/20/2020 1101   BASOSABS 0.1 06/20/2020 1101    BMET    Component Value Date/Time   NA 139 10/28/2020 0930   K 4.6 10/28/2020 0930   CL 102 10/28/2020 0930   CO2 25 10/28/2020 0930   GLUCOSE 94 10/28/2020 0930   GLUCOSE 98 12/12/2018 0314   BUN 15 10/28/2020 0930   CREATININE 1.18 10/28/2020 0930   CALCIUM 8.6 10/28/2020 0930   GFRNONAA  55 (L) 06/13/2020 0819   GFRAA 64 06/13/2020 0819    BNP No results found for: BNP  ProBNP    Component Value Date/Time   PROBNP 223 10/15/2020 1632    Specialty Problems      Pulmonary Problems   Dyspnea on exertion   Emphysema lung (HCC)   Hilar adenopathy   Right upper lobe pulmonary nodule   Chronic respiratory failure (HCC)   Oxygen dependent      No Known Allergies  Immunization History  Administered Date(s) Administered  . Fluad Quad(high Dose 65+) 04/29/2019, 04/20/2020  . Influenza, High Dose Seasonal PF 05/24/2017, 06/03/2018  . Influenza-Unspecified 06/04/2018, 04/27/2019  . PFIZER(Purple Top)SARS-COV-2 Vaccination 09/25/2019, 10/20/2019, 05/14/2020    Past Medical History:  Diagnosis Date  . Altered bowel habits 10/04/2020  . Basal cell carcinoma   . Benign prostatic hypertrophy   . Bradycardia 12/06/2019  . Chronic respiratory failure ([place]) 10/04/2020  . Coronary artery calcification seen on CAT scan 05/28/2020  . Dyslipidemia   . Dyspnea on exertion 03/18/2018  . Emphysema lung ([place]) 03/18/2018  . Exudative age-related macular degeneration of left eye with active choroidal neovascularization ([place]) 12/18/2019  . Gallbladder problem   . Gastroesophageal reflux disease   . Hiatal hernia   . Hilar adenopathy 03/25/2018  . History of smoking 03/18/2018  . 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  . Internal hemorrhoid 10/04/2020  . Macular pucker, right eye 04/30/2020  . Mediastinal adenopathy 03/25/2018  . Memory loss 05/09/2013  . Mild memory disturbance   . Nuclear sclerotic cataract of right eye 12/18/2019  . Other specified symptoms and signs involving the digestive system and abdomen 10/04/2020  . Oxygen dependent 10/04/2020  . Personal history of colonic polyps 10/04/2020  . Posterior vitreous detachment of right eye 12/18/2019  . 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  . Pulmonary hypertension  ([place])   . Right upper lobe pulmonary nodule 03/25/2018  . Syncope and collapse 12/12/2018  . TIA (transient ischemic attack)   . Weight decreased 10/04/2020    Tobacco History: Social History   Tobacco Use  Smoking Status Former Smoker  . Packs/day: 2.00  . Years: 25.00  . Pack years: 50.00  . Types: Cigarettes  . Quit date: 22  . Years since quitting: 28.4  Smokeless Tobacco Never Used   Counseling given: Not Answered   Continue to not smoke  Outpatient Encounter Medications as of 01/07/2021  Medication Sig  . albuterol (VENTOLIN HFA) 108 (90 Base) MCG/ACT inhaler Inhale 2 puffs into the lungs every 6 (six) hours as needed for wheezing or shortness of breath.  Marland Kitchen aspirin 81 MG EC tablet Take 1 tablet by mouth in the morning and at bedtime.  Marland Kitchen atorvastatin (LIPITOR) 20 MG tablet Take 20 mg by mouth daily.  . cholecalciferol (VITAMIN D) 1000 UNITS tablet Take 1,000 Units by  mouth daily.  Marland Kitchen donepezil (ARICEPT) 10 MG tablet TAKE 1 TABLET BY MOUTH  DAILY  . Fexofenadine-Pseudoephedrine (ALLEGRA-D 24 HOUR PO) Take 1 tablet by mouth daily.  . finasteride (PROSCAR) 5 MG tablet Take 5 mg by mouth daily.  Marland Kitchen ipratropium (ATROVENT) 0.06 % nasal spray Place 2 sprays into both nostrils daily as needed for rhinitis.   . Melatonin 10 MG TABS Take 10 mg by mouth at bedtime.  Marland Kitchen omeprazole (PRILOSEC) 40 MG capsule Take 40 mg by mouth daily.   . OXYGEN Inhale into the lungs. 5lt during the day and 2lt at night  . sacubitril-valsartan (ENTRESTO) 24-26 MG Take 1 tablet by mouth 2 (two) times daily. ENTRESTO FREE MONTH CARD RXBIN [id number] RXGRP [id number] RXPCN [facility]  ID [id number]  . thiamine 100 MG tablet Take 1 tablet (100 mg total) by mouth daily.  . traZODone (DESYREL) 100 MG tablet TAKE 2 TABLETS BY MOUTH AT  BEDTIME  . zaleplon (SONATA) 10 MG capsule Take 10 mg by mouth at bedtime as needed.  . nitroGLYCERIN (NITROSTAT) 0.4 MG SL tablet Place 1 tablet (0.4 mg total) under the tongue every 5  (five) minutes as needed.   No facility-administered encounter medications on file as of 01/07/2021.     Review of Systems  Review of Systems  n/a Physical Exam  BP 118/64 (BP Location: Right Arm, Cuff Size: Normal)   Pulse 80   Temp (!) 97.3 F (36.3 C)   Ht 5\' 6"  (1.676 m)   Wt 165 lb (74.8 kg)   SpO2 97%   BMI 26.63 kg/m   Wt Readings from Last 5 Encounters:  01/07/21 165 lb (74.8 kg)  10/15/20 165 lb (74.8 kg)  09/26/20 162 lb (73.5 kg)  07/04/20 161 lb (73 kg)  07/03/20 164 lb 6.4 oz (74.6 kg)    BMI Readings from Last 5 Encounters:  01/07/21 26.63 kg/m  10/15/20 26.23 kg/m  09/26/20 26.15 kg/m  07/04/20 25.99 kg/m  07/03/20 26.53 kg/m     Physical Exam General: Well-appearing, sitting in chair Eyes: EOMI, no icterus Neck: Supple, no JVP appreciated Respiratory: Diminished throughout, normal work of breathing, no wheeze Cardiovascular: Regular rhythm, no murmur    Assessment & Plan:   Dyspnea exertion: Likely multifactorial.  Related to emphysema, suspected dynamic gas trapping although none demonstrated on the recent pulmonary function test.  Component of deconditioning. Stable, more active now on O2.  Pulmonary hypertension, presumed: Based on TTE  11/2019.  No right heart cath in the past.  Suspect multifactorial.  Likely component of Group 2 disease given dilated LA, aortic valve insufficiency, diastolic dysfunction.  Likely largest component of group 3 disease given significant emphysema seen on CT as well as chronic hypoxemic respiratory failure in the setting of emphysema. VQ scan negative. No OSA, nocturnal hypoxemia present. TTE 09/2020 with improved now normal PASP but new development mildly reduced RV function. New mild reduced LV function as well. Repeat TTE 12/2020 with improved RV and LV function in setting of oxygen therapy and entresto per cardiology. No further therapy at this time.   Emphysema: significant on CT scan. Spirometry WNL, no  gas trapping, DLCO reduced.  Pulmonary nodule: repeat CT due 06/2021  Return in about 6 months (around 07/10/2021).   Lanier Clam, MD 01/07/2021

## 2021-01-09 ENCOUNTER — Other Ambulatory Visit: Payer: Self-pay | Admitting: Cardiology

## 2021-01-09 NOTE — Telephone Encounter (Signed)
Refill sent to pharmacy.   

## 2021-01-20 ENCOUNTER — Other Ambulatory Visit: Payer: Self-pay

## 2021-01-20 DIAGNOSIS — Z87891 Personal history of nicotine dependence: Secondary | ICD-10-CM

## 2021-01-20 DIAGNOSIS — K2289 Other specified disease of esophagus: Secondary | ICD-10-CM

## 2021-01-20 DIAGNOSIS — D692 Other nonthrombocytopenic purpura: Secondary | ICD-10-CM

## 2021-01-20 HISTORY — DX: Personal history of nicotine dependence: Z87.891

## 2021-01-20 HISTORY — DX: Other nonthrombocytopenic purpura: D69.2

## 2021-01-20 HISTORY — DX: Other specified disease of esophagus: K22.89

## 2021-01-22 ENCOUNTER — Other Ambulatory Visit: Payer: Self-pay

## 2021-01-22 ENCOUNTER — Ambulatory Visit: Payer: Medicare Other | Admitting: Cardiology

## 2021-01-22 ENCOUNTER — Encounter: Payer: Self-pay | Admitting: Cardiology

## 2021-01-22 VITALS — BP 102/68 | HR 67 | Ht 67.0 in | Wt 166.0 lb

## 2021-01-22 DIAGNOSIS — Z9981 Dependence on supplemental oxygen: Secondary | ICD-10-CM | POA: Diagnosis not present

## 2021-01-22 DIAGNOSIS — I272 Pulmonary hypertension, unspecified: Secondary | ICD-10-CM

## 2021-01-22 DIAGNOSIS — E785 Hyperlipidemia, unspecified: Secondary | ICD-10-CM | POA: Diagnosis not present

## 2021-01-22 DIAGNOSIS — I517 Cardiomegaly: Secondary | ICD-10-CM

## 2021-01-22 NOTE — Progress Notes (Signed)
Cardiology Office Note:    Date:  01/22/2021   ID:  Frank Meyer, DOB 26-Mar-1941, MRN [id number]  PCP:  Burnard Bunting, MD  Cardiologist:  Jenne Campus, MD    Referring MD: Burnard Bunting, MD   Chief Complaint  Patient presents with  . Follow-up  Doing fine still some short of breath  History of Present Illness:    Frank Meyer is a 80 y.o. male with past medical history significant for pulmonary hypertension which we think is most likely group 3 with some component of group 2.  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  He did have evaluation for coronary artery disease which showed some calcification with calcium score 400 however fractional flow reserve thereafter did not show any significant hemodynamically coronary artery disease.  He is being managed by pulmonary with oxygen for his COPD/emphysema and actually he is using oxygen all the time feels better but still some shortness of breath.  Still trying to play golf on the regular basis he does about 10,000 steps every single day.  Denies have any chest pain tightness squeezing pressure burning chest denies have any swelling of lower extremities.  Past Medical History:  Diagnosis Date  . Allergic rhinitis 10/24/2009  . Altered bowel habits 10/04/2020  . Basal cell carcinoma   . Benign prostatic hyperplasia with lower urinary tract symptoms 10/24/2009  . Benign prostatic hypertrophy   . Bradycardia 12/06/2019  . Callosity 10/16/2013  . Cardiomegaly 01/23/2015  . Chronic diastolic heart failure ([place]) 12/19/2018  . Chronic respiratory failure ([place]) 10/04/2020  . Coronary artery calcification seen on CAT scan 05/28/2020  . Dyslipidemia   . Dyspnea on exertion 03/18/2018  . Emphysema lung ([place]) 03/18/2018  . Encounter for general adult medical examination without abnormal findings 01/17/2015  . Ex-cigarette smoker 01/20/2021  . Exudative age-related macular degeneration of left eye with  active choroidal neovascularization ([place]) 12/18/2019  . Gallbladder problem   . Gastroesophageal reflux disease   . Hiatal hernia   . Hilar adenopathy 03/25/2018  . History of smoking 03/18/2018  . Hyperlipidemia 10/24/2009  . 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  . Internal hemorrhoid 10/04/2020  . Macular pucker, right eye 04/30/2020  . Mediastinal adenopathy 03/25/2018  . Memory loss 05/09/2013  . 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 Mild memory disturbance   . 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 Noninfective gastroenteritis and colitis, unspecified 10/04/2020  . Nuclear sclerotic cataract of right eye 12/18/2019  . Other specified symptoms and signs involving the digestive system and abdomen 10/04/2020  . Overweight 09/14/2018  . Oxygen dependent 10/04/2020  . Panlobular emphysema ([place]) 03/18/2018  . Personal history of colonic polyps 10/04/2020  . Posterior vitreous detachment of right eye 12/18/2019  . 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  . Pulmonary hypertension ([place])   . Right upper lobe pulmonary nodule 03/25/2018  . Syncope and collapse 12/12/2018  . TIA (transient ischemic attack)   . Transient ischemic attack 10/24/2009  . Weight decreased 10/04/2020    Past Surgical History:  Procedure Laterality Date  . none      Current Medications: Current Meds  Medication Sig  . albuterol (VENTOLIN HFA) 108 (90 Base) MCG/ACT inhaler Inhale 2 puffs into the lungs every 6 (six) hours as needed for wheezing or shortness of breath.  Marland Kitchen aspirin 81 MG EC tablet Take 1 tablet by mouth in the morning and at bedtime.  Marland Kitchen  atorvastatin (LIPITOR) 20 MG tablet Take 20 mg by mouth daily.  . cholecalciferol (VITAMIN D) 1000 UNITS tablet Take 1,000 Units by mouth daily.  Marland Kitchen donepezil (ARICEPT) 10 MG tablet TAKE 1 TABLET BY MOUTH  DAILY (Patient taking differently: Take 10 mg by mouth at bedtime.)  .  ENTRESTO 24-26 MG TAKE 1 TABLET BY MOUTH 2 TIMES DAILY. (Patient taking differently: Take 1 tablet by mouth 2 (two) times daily.)  . Fexofenadine-Pseudoephedrine (ALLEGRA-D 24 HOUR PO) Take 1 tablet by mouth daily.  . finasteride (PROSCAR) 5 MG tablet Take 5 mg by mouth daily.  Marland Kitchen ipratropium (ATROVENT) 0.06 % nasal spray Place 2 sprays into both nostrils daily as needed for rhinitis.   . Melatonin 10 MG TABS Take 10 mg by mouth at bedtime.  . nitroGLYCERIN (NITROSTAT) 0.4 MG SL tablet Place 1 tablet (0.4 mg total) under the tongue every 5 (five) minutes as needed. (Patient taking differently: Place 0.4 mg under the tongue every 5 (five) minutes as needed for chest pain.)  . omeprazole (PRILOSEC) 40 MG capsule Take 40 mg by mouth daily.   . OXYGEN Inhale 5 L into the lungs as directed. 5lt during the day and 2lt at night  . thiamine 100 MG tablet Take 1 tablet (100 mg total) by mouth daily.  . traZODone (DESYREL) 100 MG tablet TAKE 2 TABLETS BY MOUTH AT  BEDTIME (Patient taking differently: Take 100 mg by mouth at bedtime.)  . zaleplon (SONATA) 10 MG capsule Take 10 mg by mouth at bedtime as needed for sleep.     Allergies:   Patient has no known allergies.   Social History   Socioeconomic History  . Marital status: Married    Spouse name: pamela  . Number of children: 2  . Years of education: college  . Highest education level: Not on file  Occupational History  . Occupation: retired  Tobacco Use  . Smoking status: Former Smoker    Packs/day: 2.00    Years: 25.00    Pack years: 50.00    Types: Cigarettes    Quit date: 1994    Years since quitting: 28.4  . Smokeless tobacco: Never Used  Substance and Sexual Activity  . Alcohol use: Yes    Alcohol/week: 15.0 - 20.0 standard drinks    Types: 15 - 20 Cans of beer per week  . Drug use: Never  . Sexual activity: Not on file  Other Topics Concern  . Not on file  Social History Narrative   Pt is retired. He is married. The pt lives  with his wife. He has a Best boy. The pt has 2 children.      Patient drinks 2-3 cups of caffeine daily.   Patient is right handed.    Social Determinants of Health   Financial Resource Strain: Not on file  Food Insecurity: Not on file  Transportation Needs: Not on file  Physical Activity: Not on file  Stress: Not on file  Social Connections: Not on file     Family History: The patient's family history includes Memory loss in his mother; Obesity in his mother. ROS:   Please see the history of present illness.    All 14 point review of systems negative except as described per history of present illness  EKGs/Labs/Other Studies Reviewed:      Recent Labs: 06/13/2020: ALT 20 06/20/2020: Hemoglobin 14.0; Platelets 245.0 10/15/2020: NT-Pro BNP 223 10/28/2020: BUN 15; Creatinine, Ser 1.18; Potassium 4.6; Sodium 139  Recent Lipid  Panel    Component Value Date/Time   CHOL 147 06/13/2020 0819   TRIG 129 06/13/2020 0819   HDL 57 06/13/2020 0819   CHOLHDL 2.6 06/13/2020 0819   LDLCALC 67 06/13/2020 0819    Physical Exam:    VS:  BP 102/68 (BP Location: Right Arm, Patient Position: Sitting)   Pulse 67   Ht 5\' 7"  (1.702 m)   Wt 166 lb (75.3 kg)   SpO2 97%   BMI 26.00 kg/m     Wt Readings from Last 3 Encounters:  01/22/21 166 lb (75.3 kg)  01/07/21 165 lb (74.8 kg)  10/15/20 165 lb (74.8 kg)     GEN:  Well nourished, well developed in no acute distress HEENT: Normal NECK: No JVD; No carotid bruits LYMPHATICS: No lymphadenopathy CARDIAC: RRR, no murmurs, no rubs, no gallops RESPIRATORY:  Clear to auscultation without rales, wheezing or rhonchi  ABDOMEN: Soft, non-tender, non-distended MUSCULOSKELETAL:  No edema; No deformity  SKIN: Warm and dry LOWER EXTREMITIES: no swelling NEUROLOGIC:  Alert and oriented x 3 PSYCHIATRIC:  Normal affect   ASSESSMENT:    1. Cardiomegaly   2. Pulmonary hypertension ([place])    PLAN:    In order of problems listed  above:  1. Cardiomyopathy with ejection fraction of 4045% he was put on Entresto his ejection fraction improved now 50-55.  We will continue present management. 2. Pulmonary hypertension however last echocardiogram showed normal pulm artery pressure. 3. Coronary artery disease asymptomatic fractional flow reserve showed no critical hemodynamically significant lesions. 4. Dyslipidemia he is on Lipitor 20 which I will continue, I did review his K PN which show LDL of 67 HDL 57.  But some good cholesterol management we will continue present management.   Medication Adjustments/Labs and Tests Ordered: Current medicines are reviewed at length with the patient today.  Concerns regarding medicines are outlined above.  No orders of the defined types were placed in this encounter.  Medication changes: No orders of the defined types were placed in this encounter.   Signed, Park Liter, MD, [facility] 01/22/2021 11:28 AM    [place]

## 2021-01-22 NOTE — Patient Instructions (Signed)

## 2021-02-04 ENCOUNTER — Encounter (INDEPENDENT_AMBULATORY_CARE_PROVIDER_SITE_OTHER): Payer: Medicare Other | Admitting: Ophthalmology

## 2021-02-11 ENCOUNTER — Encounter (INDEPENDENT_AMBULATORY_CARE_PROVIDER_SITE_OTHER): Payer: Medicare Other | Admitting: Ophthalmology

## 2021-02-19 ENCOUNTER — Encounter (INDEPENDENT_AMBULATORY_CARE_PROVIDER_SITE_OTHER): Payer: Self-pay | Admitting: Ophthalmology

## 2021-02-19 ENCOUNTER — Other Ambulatory Visit: Payer: Self-pay

## 2021-02-19 ENCOUNTER — Ambulatory Visit (INDEPENDENT_AMBULATORY_CARE_PROVIDER_SITE_OTHER): Payer: Medicare Other | Admitting: Ophthalmology

## 2021-02-19 DIAGNOSIS — H2511 Age-related nuclear cataract, right eye: Secondary | ICD-10-CM

## 2021-02-19 DIAGNOSIS — H353221 Exudative age-related macular degeneration, left eye, with active choroidal neovascularization: Secondary | ICD-10-CM | POA: Diagnosis not present

## 2021-02-19 DIAGNOSIS — H353112 Nonexudative age-related macular degeneration, right eye, intermediate dry stage: Secondary | ICD-10-CM | POA: Diagnosis not present

## 2021-02-19 MED ORDER — BEVACIZUMAB 2.5 MG/0.1ML IZ SOSY
2.5000 mg | PREFILLED_SYRINGE | INTRAVITREAL | Status: AC | PRN
  Administered 2021-02-19: 11:00:00 2.5 mg via INTRAVITREAL

## 2021-02-19 NOTE — Assessment & Plan Note (Signed)
No signs of CNVM OD 

## 2021-02-19 NOTE — Assessment & Plan Note (Signed)
Patient has complaints of trouble reading needing larger magnifiers, I did explain the patient that the cataract in the right eye might be triggering some of his visual difficulties.  He replied he did not know he had a cataract despite discussions with this office in the past  I did suggest he follow-up with his primary eye care physician and consider cataract extraction with intraocular lens placement to maximize visual potential in the right eye

## 2021-02-19 NOTE — Assessment & Plan Note (Signed)
Onset of disease November 2020, now stabilized and at 50-month follow-up interval.  We will repeat injection today to maintain and consider examination in 4 months and possible injection at that time

## 2021-02-19 NOTE — Progress Notes (Signed)
02/19/2021     CHIEF COMPLAINT Patient presents for Retina Follow Up (3 month fu ou and Avastin OS/Pt states VA OU stable since last visit. Pt denies FOL, floaters, or ocular pain OU. /)   HISTORY OF PRESENT ILLNESS: Frank Meyer is a 80 y.o. male who presents to the clinic today for:   HPI     Retina Follow Up           Diagnosis: Wet AMD   Laterality: left eye   Onset: 3 months ago   Severity: mild   Duration: 3 months   Course: stable   Comments: 3 month fu ou and Avastin OS Pt states VA OU stable since last visit. Pt denies FOL, floaters, or ocular pain OU.         Last edited by Kendra Opitz, COA on 02/19/2021 10:34 AM.      Referring physician: Burnard Bunting, MD [place],  [place] [address]  HISTORICAL INFORMATION:   Selected notes from the [facility]: No current outpatient medications on file. (Ophthalmic Drugs)   No current facility-administered medications for this visit. (Ophthalmic Drugs)   Current Outpatient Medications (Other)  Medication Sig   albuterol (VENTOLIN HFA) 108 (90 Base) MCG/ACT inhaler Inhale 2 puffs into the lungs every 6 (six) hours as needed for wheezing or shortness of breath.   aspirin 81 MG EC tablet Take 1 tablet by mouth in the morning and at bedtime.   atorvastatin (LIPITOR) 20 MG tablet Take 20 mg by mouth daily.   cholecalciferol (VITAMIN D) 1000 UNITS tablet Take 1,000 Units by mouth daily.   donepezil (ARICEPT) 10 MG tablet TAKE 1 TABLET BY MOUTH  DAILY (Patient taking differently: Take 10 mg by mouth at bedtime.)   ENTRESTO 24-26 MG TAKE 1 TABLET BY MOUTH 2 TIMES DAILY. (Patient taking differently: Take 1 tablet by mouth 2 (two) times daily.)   Fexofenadine-Pseudoephedrine (ALLEGRA-D 24 HOUR PO) Take 1 tablet by mouth daily.   finasteride (PROSCAR) 5 MG tablet Take 5 mg by mouth daily.   ipratropium (ATROVENT) 0.06 % nasal spray Place 2 sprays into both nostrils  daily as needed for rhinitis.    Melatonin 10 MG TABS Take 10 mg by mouth at bedtime.   nitroGLYCERIN (NITROSTAT) 0.4 MG SL tablet Place 1 tablet (0.4 mg total) under the tongue every 5 (five) minutes as needed. (Patient taking differently: Place 0.4 mg under the tongue every 5 (five) minutes as needed for chest pain.)   omeprazole (PRILOSEC) 40 MG capsule Take 40 mg by mouth daily.    OXYGEN Inhale 5 L into the lungs as directed. 5lt during the day and 2lt at night   thiamine 100 MG tablet Take 1 tablet (100 mg total) by mouth daily.   traZODone (DESYREL) 100 MG tablet TAKE 2 TABLETS BY MOUTH AT  BEDTIME (Patient taking differently: Take 100 mg by mouth at bedtime.)   zaleplon (SONATA) 10 MG capsule Take 10 mg by mouth at bedtime as needed for sleep.   No current facility-administered medications for this visit. (Other)      REVIEW OF SYSTEMS:    ALLERGIES No Known Allergies  PAST MEDICAL HISTORY Past Medical History:  Diagnosis Date   Allergic rhinitis 10/24/2009   Altered bowel habits 10/04/2020   Basal cell carcinoma    Benign prostatic hyperplasia with lower urinary tract symptoms 10/24/2009   Benign prostatic hypertrophy  Bradycardia 12/06/2019   Callosity 10/16/2013   Cardiomegaly 01/23/2015   Chronic diastolic heart failure ([place]) 12/19/2018   Chronic respiratory failure ([place]) 10/04/2020   Coronary artery calcification seen on CAT scan 05/28/2020   Dyslipidemia    Dyspnea on exertion 03/18/2018   Emphysema lung ([place]) 03/18/2018   Encounter for general adult medical examination without abnormal findings 01/17/2015   Ex-cigarette smoker 01/20/2021   Exudative age-related macular degeneration of left eye with active choroidal neovascularization ([place]) 12/18/2019   Gallbladder problem    Gastroesophageal reflux disease    Hiatal hernia    Hilar adenopathy 03/25/2018   History of smoking 03/18/2018   Hyperlipidemia 10/24/2009   Insomnia 05/09/2013   Intermediate stage nonexudative age-related  macular degeneration of right eye 07/30/2020   Internal hemorrhoid 10/04/2020   Macular pucker, right eye 04/30/2020   Mediastinal adenopathy 03/25/2018   Memory loss 05/09/2013   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   Mild memory disturbance    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   Noninfective gastroenteritis and colitis, unspecified 10/04/2020   Nuclear sclerotic cataract of right eye 12/18/2019   Other specified symptoms and signs involving the digestive system and abdomen 10/04/2020   Overweight 09/14/2018   Oxygen dependent 10/04/2020   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   Personal history of colonic polyps 10/04/2020   Posterior vitreous detachment of right eye 12/18/2019   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   Pulmonary hypertension ([place])    Right upper lobe pulmonary nodule 03/25/2018   Syncope and collapse 12/12/2018   TIA (transient ischemic attack)    Transient ischemic attack 10/24/2009   Weight decreased 10/04/2020   Past Surgical History:  Procedure Laterality Date   none      FAMILY HISTORY Family History  Problem Relation Age of Onset   Obesity Mother    Memory loss Mother     SOCIAL HISTORY Social History   Tobacco Use   Smoking status: Former    Packs/day: 2.00    Years: 25.00    Pack years: 50.00    Types: Cigarettes    Quit date: 1994    Years since quitting: 28.5   Smokeless tobacco: Never  Substance Use Topics   Alcohol use: Yes    Alcohol/week: 15.0 - 20.0 standard drinks    Types: 15 - 20 Cans of beer per week   Drug use: Never         OPHTHALMIC EXAM:  Base Eye Exam     Visual Acuity (ETDRS)       Right Left   Dist [place] 20/30 -2 20/30   Dist ph [place] NI NI         Tonometry (Tonopen, 10:37 AM)       Right Left   Pressure 14 12         Pupils       Pupils Dark Light Shape React APD   Right PERRL 3 2 Round Brisk None   Left PERRL 3 2 Round Brisk None         Visual Fields (Counting fingers)        Left Right    Full Full         Extraocular Movement       Right Left    Full Full         Neuro/Psych     Oriented x3: Yes   Mood/Affect: Normal         Dilation  Both eyes: 1.0% Mydriacyl, 2.5% Phenylephrine @ 10:37 AM           Slit Lamp and Fundus Exam     External Exam       Right Left   External Normal Normal         Slit Lamp Exam       Right Left   Lids/Lashes Normal Normal   Conjunctiva/Sclera White and quiet White and quiet   Cornea Clear Clear   Anterior Chamber Deep and quiet Deep and quiet   Iris Round and reactive Round and reactive   Lens 2+ Nuclear sclerosis Posterior chamber intraocular lens   Anterior Vitreous Normal Normal         Fundus Exam       Right Left   Posterior Vitreous Normal Posterior vitreous detachment   Disc Normal Normal   C/D Ratio 0.1 0.1   Macula Early age related macular degeneration, Hard drusen, no macular thickening Retinal pigment epithelial mottling, Early age related macular degeneration, no hemorrhage, Hard drusen, no exudates, Geographic atrophy, no macular thickening   Vessels Normal Normal   Periphery Normal Normal            IMAGING AND PROCEDURES  Imaging and Procedures for 02/19/21  OCT, Retina - OU - Both Eyes       Right Eye Quality was good. Scan locations included subfoveal. Central Foveal Thickness: 307. Progression has been stable. Findings include no SRF, no IRF, epiretinal membrane.   Left Eye Quality was good. Scan locations included subfoveal. Central Foveal Thickness: 304. Progression has improved. Findings include retinal drusen , no IRF, no SRF, subretinal hyper-reflective material.   Notes OD, dry ARMD, minor epiretinal membrane on distorting  OS, subretinal hyper reflective material in the region of previous active CNVM, fibrosis, stable at 100-month follow-up post Avastin, repeat today.  Onset October 2020 vastly improved macular anatomy and acuity      Intravitreal Injection, Pharmacologic Agent - OS - Left Eye       Time Out 02/19/2021. 11:21 AM. Confirmed correct patient, procedure, site, and patient consented.   Anesthesia Topical anesthesia was used. Anesthetic medications included Akten 3.5%.   Procedure Preparation included 10% betadine to eyelids, 5% betadine to ocular surface, Tobramycin 0.3%. A 30 gauge needle was used.   Injection: 2.5 mg bevacizumab 2.5 MG/0.1ML   Route: Intravitreal, Site: Left Eye   NDC: [phone]   Post-op Post injection exam found visual acuity of at least counting fingers. The patient tolerated the procedure well. There were no complications. The patient received written and verbal post procedure care education. Post injection medications were not given.              ASSESSMENT/PLAN:  Exudative age-related macular degeneration of left eye with active choroidal neovascularization ([place]) Onset of disease November 2020, now stabilized and at 87-month follow-up interval.  We will repeat injection today to maintain and consider examination in 4 months and possible injection at that time  Intermediate stage nonexudative age-related macular degeneration of right eye No signs of CNVM OD  Nuclear sclerotic cataract of right eye Patient has complaints of trouble reading needing larger magnifiers, I did explain the patient that the cataract in the right eye might be triggering some of his visual difficulties.  He replied he did not know he had a cataract despite discussions with this office in the past  I did suggest he follow-up with his primary eye care physician and consider cataract extraction with  intraocular lens placement to maximize visual potential in the right eye     ICD-10-CM   1. Exudative age-related macular degeneration of left eye with active choroidal neovascularization (HCC)  H35.3221 OCT, Retina - OU - Both Eyes    Intravitreal Injection, Pharmacologic Agent - OS - Left Eye     bevacizumab (AVASTIN) SOSY 2.5 mg    2. Intermediate stage nonexudative age-related macular degeneration of right eye  H35.3112     3. Nuclear sclerotic cataract of right eye  H25.11       1.  OS, much improved macular anatomy as compared to onset of wet AMD from October 2020.  Now at 26-month follow-up interval on maintenance dosing Avastin.  Repeat today and examination next in 4 months  2.  3.  Ophthalmic Meds Ordered this visit:  Meds ordered this encounter  Medications   bevacizumab (AVASTIN) SOSY 2.5 mg       Return in about 4 months (around 06/22/2021) for DILATE OU, OCT, possible Avastin OS.  There are no Patient Instructions on file for this visit.   Explained the diagnoses, plan, and follow up with the patient and they expressed understanding.  Patient expressed understanding of the importance of proper follow up care.   Clent Demark Hadley Detloff M.D. Diseases & Surgery of the Retina and Vitreous [facility] [place] 02/19/21     Abbreviations: M myopia (nearsighted); A astigmatism; H hyperopia (farsighted); P presbyopia; Mrx spectacle prescription;  CTL contact lenses; OD right eye; OS left eye; OU both eyes  XT exotropia; ET esotropia; PEK punctate epithelial keratitis; PEE punctate epithelial erosions; DES dry eye syndrome; MGD meibomian gland dysfunction; ATs artificial tears; PFAT's preservative free artificial tears; [place] nuclear sclerotic cataract; PSC posterior subcapsular cataract; ERM epi-retinal membrane; PVD posterior vitreous detachment; RD retinal detachment; DM diabetes mellitus; DR diabetic retinopathy; NPDR non-proliferative diabetic retinopathy; PDR proliferative diabetic retinopathy; CSME clinically significant macular edema; DME diabetic macular edema; dbh dot blot hemorrhages; CWS cotton wool spot; POAG primary open angle glaucoma; C/D cup-to-disc ratio; HVF humphrey visual field; GVF goldmann visual field; OCT optical coherence tomography; IOP  intraocular pressure; BRVO Branch retinal vein occlusion; CRVO central retinal vein occlusion; CRAO central retinal artery occlusion; BRAO branch retinal artery occlusion; RT retinal tear; SB scleral buckle; PPV pars plana vitrectomy; VH Vitreous hemorrhage; PRP panretinal laser photocoagulation; IVK intravitreal kenalog; VMT vitreomacular traction; MH Macular hole;  NVD neovascularization of the disc; NVE neovascularization elsewhere; AREDS age related eye disease study; ARMD age related macular degeneration; POAG primary open angle glaucoma; EBMD epithelial/anterior basement membrane dystrophy; ACIOL anterior chamber intraocular lens; IOL intraocular lens; PCIOL posterior chamber intraocular lens; Phaco/IOL phacoemulsification with intraocular lens placement; [place] photorefractive keratectomy; LASIK laser assisted in situ keratomileusis; HTN hypertension; DM diabetes mellitus; COPD chronic obstructive pulmonary disease

## 2021-03-02 ENCOUNTER — Other Ambulatory Visit: Payer: Self-pay | Admitting: Adult Health

## 2021-05-29 ENCOUNTER — Ambulatory Visit: Payer: Medicare Other | Admitting: Neurology

## 2021-05-29 ENCOUNTER — Encounter: Payer: Self-pay | Admitting: Neurology

## 2021-05-29 VITALS — BP 121/70 | HR 64 | Ht 66.0 in | Wt 171.2 lb

## 2021-05-29 DIAGNOSIS — R413 Other amnesia: Secondary | ICD-10-CM | POA: Diagnosis not present

## 2021-05-29 DIAGNOSIS — F09 Unspecified mental disorder due to known physiological condition: Secondary | ICD-10-CM

## 2021-05-29 NOTE — Progress Notes (Signed)
Reason for visit: Mild memory disturbance  Frank Meyer is an 80 y.o. male  History of present illness:  Frank Meyer is an 80 year old right-handed white male with a history of a mild memory disturbance.  He is on Aricept in the evening, he tolerates this fairly well.  He has not given up any activities of daily living because of memory.  He still operates a Teacher, music, he is doing the finances, keeping up with medications and appointments.  He feels less sure of himself with these activities however.  The patient is on oxygen 24/7.  He still tries to remain active with playing golf.  He is sleeping fairly well at night.  He returns to this office for further evaluation.  He takes trazodone at night for sleep.  Past Medical History:  Diagnosis Date   Allergic rhinitis 10/24/2009   Altered bowel habits 10/04/2020   Basal cell carcinoma    Benign prostatic hyperplasia with lower urinary tract symptoms 10/24/2009   Benign prostatic hypertrophy    Bradycardia 12/06/2019   Callosity 10/16/2013   Cardiomegaly 01/23/2015   Chronic diastolic heart failure ([place]) 12/19/2018   Chronic respiratory failure ([place]) 10/04/2020   Coronary artery calcification seen on CAT scan 05/28/2020   Dyslipidemia    Dyspnea on exertion 03/18/2018   Emphysema lung ([place]) 03/18/2018   Encounter for general adult medical examination without abnormal findings 01/17/2015   Ex-cigarette smoker 01/20/2021   Exudative age-related macular degeneration of left eye with active choroidal neovascularization ([place]) 12/18/2019   Gallbladder problem    Gastroesophageal reflux disease    Hiatal hernia    Hilar adenopathy 03/25/2018   History of smoking 03/18/2018   Hyperlipidemia 10/24/2009   Insomnia 05/09/2013   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   Internal hemorrhoid 10/04/2020   Macular pucker, right eye 04/30/2020   Mediastinal adenopathy 03/25/2018   Memory loss 05/09/2013   Mild  cognitive disorder 10/24/2009   Mild memory disturbance    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   Noninfective gastroenteritis and colitis, unspecified 10/04/2020   Nuclear sclerotic cataract of right eye 12/18/2019   Other specified symptoms and signs involving the digestive system and abdomen 10/04/2020   Overweight 09/14/2018   Oxygen dependent 10/04/2020   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   Personal history of colonic polyps 10/04/2020   Posterior vitreous detachment of right eye 12/18/2019   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 His smoking use included cigarettes. He has a 50.00 pack-year smoking history. He has never used smokeless tobacco. He reports current alcohol use of about 15.0 - 20.0 standard drinks per week. He reports that he does not use drugs.   No Known Allergies  Medications:  Prior to Admission medications   Medication Sig Start Date End Date Taking? Authorizing Provider  albuterol (VENTOLIN HFA) 108 (90 Base) MCG/ACT inhaler Inhale 2 puffs into the lungs every 6 (six) hours as needed for  wheezing or shortness of breath. 07/15/20  Yes Magdalen Spatz, NP  aspirin 81 MG EC tablet Take 1 tablet by mouth in the morning and at bedtime.   Yes [provider]  atorvastatin (LIPITOR) 20 MG tablet Take 20 mg by mouth daily.   Yes [provider]  cholecalciferol (VITAMIN D) 1000 UNITS tablet Take 1,000 Units by mouth daily.   Yes [provider]  donepezil (ARICEPT) 10 MG tablet TAKE 1  TABLET BY MOUTH  DAILY 03/03/21  Yes Kathrynn Ducking, MD  ENTRESTO 24-26 MG TAKE 1 TABLET BY MOUTH 2 TIMES DAILY. Patient taking differently: Take 1 tablet by mouth 2 (two) times daily. 01/09/21  Yes Park Liter, MD  Fexofenadine-Pseudoephedrine (ALLEGRA-D 24 HOUR PO) Take 1 tablet by mouth daily.   Yes [provider]  finasteride (PROSCAR) 5 MG tablet Take 5 mg by mouth daily.   Yes [provider]  ipratropium (ATROVENT) 0.06 % nasal spray Place 2 sprays into both nostrils daily as needed for rhinitis.  02/09/13  Yes [provider]  Melatonin 10 MG TABS Take 10 mg by mouth at bedtime.   Yes [provider]  omeprazole (PRILOSEC) 40 MG capsule Take 40 mg by mouth daily.  03/29/13  Yes [provider]  OXYGEN Inhale 5 L into the lungs as directed. 5lt during the day and 2lt at night   Yes [provider]  thiamine 100 MG tablet Take 1 tablet (100 mg total) by mouth daily. 12/13/18  Yes Vann, Jessica U, DO  traZODone (DESYREL) 100 MG tablet TAKE 2 TABLETS BY MOUTH AT  BEDTIME Patient taking differently: Take 100 mg by mouth at bedtime. 09/23/20  Yes Ward Givens, NP  nitroGLYCERIN (NITROSTAT) 0.4 MG SL tablet Place 1 tablet (0.4 mg total) under the tongue every 5 (five) minutes as needed. Patient taking differently: Place 0.4 mg under the tongue every 5 (five) minutes as needed for chest pain. 06/12/20 09/10/20  Revankar, Reita Cliche, MD  zaleplon (SONATA) 10 MG capsule Take 10 mg by mouth at bedtime as needed for sleep. Patient not taking: Reported on 05/29/2021    [provider]    ROS:  Out of a complete 14 system review of symptoms, the patient complains only of the following symptoms, and all other reviewed systems are negative.  Memory problems Shortness of breath  Blood pressure 121/70, pulse 64, height 5\' 6"  (1.676 m), weight 171 lb 3.2 oz (77.7 kg).  Physical Exam  General: The patient is alert and cooperative at  the time of the examination.  Skin: No significant peripheral edema is noted.   Neurologic Exam  Mental status: The patient is alert and oriented x 3 at the time of the examination. The patient has apparent normal recent and remote memory, with an apparently normal attention span and concentration ability.  The Mini-Mental status examination done today shows a total score of 29/30.  The patient is able to name 13 four-legged animals in 60 seconds.   Cranial nerves: Facial symmetry is present. Speech is normal, no aphasia or dysarthria is noted. Extraocular movements are full. Visual fields are full.  Motor: The patient has good strength in all 4 extremities.  Sensory examination: Soft touch sensation is symmetric on the face, arms, and legs.  Coordination: The patient has good finger-nose-finger and heel-to-shin bilaterally.  Gait and station: The patient has a normal gait. Tandem gait is slightly unsteady. Romberg is negative. No drift is seen.  Reflexes: Deep tendon reflexes are symmetric.   Assessment/Plan:  1.  Mild cognitive impairment  The patient will continue the donepezil, he will follow-up here in 8 months.  In the future, he can be followed through Dr. Brett Fairy.  Jill Alexanders MD 05/29/2021 10:08 AM  [facility] [address] [place], [place] [address]  Phone [phone] Fax [phone]

## 2021-05-30 ENCOUNTER — Ambulatory Visit: Payer: Medicare Other | Admitting: Pulmonary Disease

## 2021-06-10 ENCOUNTER — Other Ambulatory Visit: Payer: Self-pay | Admitting: Cardiology

## 2021-06-10 ENCOUNTER — Other Ambulatory Visit: Payer: Self-pay

## 2021-06-10 ENCOUNTER — Encounter: Payer: Self-pay | Admitting: Pulmonary Disease

## 2021-06-10 ENCOUNTER — Ambulatory Visit: Payer: Medicare Other | Admitting: Pulmonary Disease

## 2021-06-10 VITALS — BP 128/80 | HR 60 | Ht 66.0 in | Wt 168.0 lb

## 2021-06-10 DIAGNOSIS — J431 Panlobular emphysema: Secondary | ICD-10-CM

## 2021-06-10 DIAGNOSIS — J9611 Chronic respiratory failure with hypoxia: Secondary | ICD-10-CM | POA: Diagnosis not present

## 2021-06-10 DIAGNOSIS — R911 Solitary pulmonary nodule: Secondary | ICD-10-CM | POA: Diagnosis not present

## 2021-06-10 DIAGNOSIS — R0609 Other forms of dyspnea: Secondary | ICD-10-CM

## 2021-06-10 MED ORDER — UMECLIDINIUM-VILANTEROL 62.5-25 MCG/ACT IN AEPB: 1.0000 | INHALATION_SPRAY | Freq: Every day | RESPIRATORY_TRACT | 0 refills | Status: DC

## 2021-06-10 NOTE — Addendum Note (Signed)
Addended by: Konrad Felix L on: 06/10/2021 01:22 PM   Modules accepted: Orders

## 2021-06-10 NOTE — Progress Notes (Signed)
@Patient  ID: Frank Meyer, male    DOB: 08-20-1940, 80 y.o.   MRN: [id number]  Chief Complaint  Patient presents with   Follow-up    Referring provider: Burnard Bunting, MD  HPI:   80 y.o. 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 Cardiology note 01/2021 reviewed.  Breathing stable. Playing golf twice a week. Enjoying this. No worse but breathing no better. Stopped albuterol as never felt helpful.  Reviewed recent TTE 12/2020 with improved LV and RV function. PASP estimated normal.   HPI at initial visit:  Patient longstanding history of shortness of breath.  He is need oxygen for some years.  Uses up to 5 L with activity, 3-4 at rest.  His shortness of breath is described as severe.  Worse with exertion.  Somewhat relieved by rest over time.  No positional changes.  Does not seem to change with seasons, environment.  No other alleviating or exacerbating factors.  No history or family history I should say of pulmonary hypertension.  He denies any connective tissue disease.  No arthralgias, arthritis.  Denies Raynaud's phenomenon.  Never taken diet supplements.  Does not use methamphetamine.  No congenital heart disease per his report.  Extensive review of medical record performed today.  Recent high-res CT chest with severe centrilobular and paraseptal emphysema on my review.  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  No evidence of ILD on my review.  TTE performed 11/2019.  This demonstrated mildly dilated LA, diastolic dysfunction, mild aortic regurgitation, mildly dilated RA, mildly enlarged RV with normal RV function, estimated elevated PASP.  Most recent PFTs 06/2020 demonstrated severely reduced DLCO 44% predicted with a surprisingly PFTs, TLC, no evidence of hyperinflation.  PMH: COPD, enlarged prostate, hyperlipidemia Surgical history: Reviewed in detail, he denies significant surgical  history Family history: Memory loss in mother, denies significant pulmonary or respiratory issues and family Social history: Former smoker, 50-pack-year history, quit late 90s, retired   Licensed conveyancer / Pulmonary Flowsheets:   ACT:  No flowsheet data found.  MMRC: mMRC Dyspnea Scale mMRC Score  09/26/2020 0    Epworth:  No flowsheet data found.  Tests:   FENO:  No results found for: NITRICOXIDE  PFT: PFT Results Latest Ref Rng & Units 06/26/2020 04/27/2018  FVC-Pre L 3.75 4.11  FVC-Predicted Pre % 111 119  FVC-Post L 3.85 4.07  FVC-Predicted Post % 113 118  Pre FEV1/FVC % % 78 79  Post FEV1/FCV % % 78 78  FEV1-Pre L 2.92 3.23  FEV1-Predicted Pre % 122 131  FEV1-Post L 3.02 3.20  DLCO uncorrected ml/min/mmHg 9.44 11.81  DLCO UNC% % 44 44  DLCO corrected ml/min/mmHg 9.61 11.92  DLCO COR %Predicted % 44 44  DLVA Predicted % 45 49  TLC L 5.75 5.24  TLC % Predicted % 92 84  RV % Predicted % 87 51  Personally reviewed and interpreted as above  WALK:  No flowsheet data found.  Imaging: Personally reviewed and as per EMR discussion of this note  Lab Results: Personally reviewed, notably eosinophils as high as 400 06/2020 CBC    Component Value Date/Time   WBC 10.6 (H) 06/20/2020 1101   RBC 5.08 06/20/2020 1101   HGB 14.0 06/20/2020 1101   HCT 43.1 06/20/2020 1101   PLT 245.0 06/20/2020 1101   MCV 84.8 06/20/2020 1101   MCH 29.1 12/12/2018 0314   MCHC 32.6 06/20/2020 1101   RDW 14.3 06/20/2020 1101  LYMPHSABS 2.5 06/20/2020 1101   MONOABS 1.0 06/20/2020 1101   EOSABS 0.4 06/20/2020 1101   BASOSABS 0.1 06/20/2020 1101    BMET    Component Value Date/Time   NA 139 10/28/2020 0930   K 4.6 10/28/2020 0930   CL 102 10/28/2020 0930   CO2 25 10/28/2020 0930   GLUCOSE 94 10/28/2020 0930   GLUCOSE 98 12/12/2018 0314   BUN 15 10/28/2020 0930   CREATININE 1.18 10/28/2020 0930   CALCIUM 8.6 10/28/2020 0930   GFRNONAA 55 (L) 06/13/2020 0819   GFRAA 64  06/13/2020 0819    BNP No results found for: BNP  ProBNP    Component Value Date/Time   PROBNP 223 10/15/2020 1632    Specialty Problems       Pulmonary Problems   Allergic rhinitis   Dyspnea on exertion   Panlobular emphysema (HCC)   Right upper lobe pulmonary nodule   Chronic respiratory failure (HCC)   Oxygen dependent    No Known Allergies  Immunization History  Administered Date(s) Administered   Fluad Quad(high Dose 65+) 04/29/2019, 04/20/2020   Influenza, High Dose Seasonal PF 05/24/2017, 06/03/2018   Influenza-Unspecified 06/04/2018, 04/27/2019   PFIZER(Purple Top)SARS-COV-2 Vaccination 09/25/2019, 10/20/2019, 05/14/2020    Past Medical History:  Diagnosis Date   Allergic rhinitis 10/24/2009   Altered bowel habits 10/04/2020   Basal cell carcinoma    Benign prostatic hyperplasia with lower urinary tract symptoms 10/24/2009   Benign prostatic hypertrophy    Bradycardia 12/06/2019   Callosity 10/16/2013   Cardiomegaly 01/23/2015   Chronic diastolic heart failure ([place]) 12/19/2018   Chronic respiratory failure ([place]) 10/04/2020   Coronary artery calcification seen on CAT scan 05/28/2020   Dyslipidemia    Dyspnea on exertion 03/18/2018   Emphysema lung ([place]) 03/18/2018   Encounter for general adult medical examination without abnormal findings 01/17/2015   Ex-cigarette smoker 01/20/2021   Exudative age-related macular degeneration of left eye with active choroidal neovascularization ([place]) 12/18/2019   Gallbladder problem    Gastroesophageal reflux disease    Hiatal hernia    Hilar adenopathy 03/25/2018   History of smoking 03/18/2018   Hyperlipidemia 10/24/2009   Insomnia 05/09/2013   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   Internal hemorrhoid 10/04/2020   Macular pucker, right eye 04/30/2020   Mediastinal adenopathy 03/25/2018   Memory loss 05/09/2013   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   Mild memory disturbance     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   Noninfective gastroenteritis and colitis, unspecified 10/04/2020   Nuclear sclerotic cataract of right eye 12/18/2019   Other specified symptoms and signs involving the digestive system and abdomen 10/04/2020   Overweight 09/14/2018   Oxygen dependent 10/04/2020   Panlobular emphysema ([place]) 03/18/2018   Personal history of colonic polyps 10/04/2020   Posterior vitreous detachment of right eye 12/18/2019   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   Pulmonary hypertension ([place])    Right upper lobe pulmonary nodule 03/25/2018   Syncope and collapse 12/12/2018   TIA (transient ischemic attack)    Transient ischemic attack 10/24/2009   Weight decreased 10/04/2020    Tobacco History: Social History   Tobacco Use  Smoking Status Former   Packs/day: 2.00   Years: 25.00   Pack years: 50.00   Types: Cigarettes   Quit date: 1994   Years since quitting: 28.8  Smokeless Tobacco Never   Counseling given: Not Answered   Continue to not smoke  Outpatient Encounter  Medications as of 06/10/2021  Medication Sig   aspirin 81 MG EC tablet Take 1 tablet by mouth in the morning and at bedtime.   atorvastatin (LIPITOR) 20 MG tablet Take 20 mg by mouth daily.   cholecalciferol (VITAMIN D) 1000 UNITS tablet Take 1,000 Units by mouth daily.   donepezil (ARICEPT) 10 MG tablet TAKE 1 TABLET BY MOUTH  DAILY   ENTRESTO 24-26 MG TAKE 1 TABLET BY MOUTH 2 TIMES DAILY. (Patient taking differently: Take 1 tablet by mouth 2 (two) times daily.)   Fexofenadine-Pseudoephedrine (ALLEGRA-D 24 HOUR PO) Take 1 tablet by mouth daily.   finasteride (PROSCAR) 5 MG tablet Take 5 mg by mouth daily.   ipratropium (ATROVENT) 0.06 % nasal spray Place 2 sprays into both nostrils daily as needed for rhinitis.    Melatonin 10 MG TABS Take 10 mg by mouth at bedtime.   omeprazole (PRILOSEC) 40 MG capsule Take 40 mg by mouth daily.    OXYGEN Inhale 5 L into the lungs as  directed. 5lt during the day and 2lt at night   thiamine 100 MG tablet Take 1 tablet (100 mg total) by mouth daily.   traZODone (DESYREL) 100 MG tablet TAKE 2 TABLETS BY MOUTH AT  BEDTIME (Patient taking differently: Take 100 mg by mouth at bedtime.)   albuterol (VENTOLIN HFA) 108 (90 Base) MCG/ACT inhaler Inhale 2 puffs into the lungs every 6 (six) hours as needed for wheezing or shortness of breath. (Patient not taking: Reported on 06/10/2021)   nitroGLYCERIN (NITROSTAT) 0.4 MG SL tablet Place 1 tablet (0.4 mg total) under the tongue every 5 (five) minutes as needed. (Patient taking differently: Place 0.4 mg under the tongue every 5 (five) minutes as needed for chest pain.)   No facility-administered encounter medications on file as of 06/10/2021.     Review of Systems  Review of Systems  n/a Physical Exam  BP 128/80 (BP Location: Right Arm, Cuff Size: Normal)   Pulse 60   Ht 5\' 6"  (1.676 m)   Wt 168 lb (76.2 kg)   SpO2 98%   BMI 27.12 kg/m   Wt Readings from Last 5 Encounters:  06/10/21 168 lb (76.2 kg)  05/29/21 171 lb 3.2 oz (77.7 kg)  01/22/21 166 lb (75.3 kg)  01/07/21 165 lb (74.8 kg)  10/15/20 165 lb (74.8 kg)    BMI Readings from Last 5 Encounters:  06/10/21 27.12 kg/m  05/29/21 27.63 kg/m  01/22/21 26.00 kg/m  01/07/21 26.63 kg/m  10/15/20 26.23 kg/m     Physical Exam General: Well-appearing, sitting in chair Eyes: EOMI, no icterus Neck: Supple, no JVP appreciated Respiratory: Diminished throughout, normal work of breathing, no wheeze Cardiovascular: Regular rhythm, no murmur    Assessment & Plan:   Dyspnea exertion: Likely multifactorial.  Related to emphysema, suspected dynamic gas trapping although none demonstrated on the recent pulmonary function test.  Component of deconditioning. Stable, more active now on O2.  Pulmonary hypertension, presumed: Based on TTE  11/2019.  No right heart cath in the past.  Suspect multifactorial.  Likely  component of Group 2 disease given dilated LA, aortic valve insufficiency, diastolic dysfunction.  Likely largest component of group 3 disease given significant emphysema seen on CT as well as chronic hypoxemic respiratory failure in the setting of emphysema. VQ scan negative. No OSA, nocturnal hypoxemia present. TTE 09/2020 with improved now normal PASP but new development mildly reduced RV function. New mild reduced LV function as well. Repeat TTE 12/2020 with improved  RV and LV function in setting of oxygen therapy and entresto per cardiology. No further therapy at this time.   Emphysema: significant on CT scan. Spirometry WNL, no gas trapping, DLCO reduced. Albuterol not helpful. Trial Anoro today.   Pulmonary nodule: repeat CT due 06/2021 - ordered  Chronic hypoxemic respiratory failure: Re-qualified for O2 today.   Return in about 6 months (around 12/09/2021).   Lanier Clam, MD 06/10/2021

## 2021-06-10 NOTE — Patient Instructions (Signed)
Nice To see you again  Continue using oxygen  Try Anoro 1 puff daily  Send me a message in a couple of weeks and let me know if it helps or not  Return to clinic in 6 months or sooner as needed

## 2021-06-23 ENCOUNTER — Encounter (INDEPENDENT_AMBULATORY_CARE_PROVIDER_SITE_OTHER): Payer: Medicare Other | Admitting: Ophthalmology

## 2021-06-24 ENCOUNTER — Other Ambulatory Visit: Payer: Self-pay

## 2021-06-24 ENCOUNTER — Ambulatory Visit (INDEPENDENT_AMBULATORY_CARE_PROVIDER_SITE_OTHER): Payer: Medicare Other | Admitting: Ophthalmology

## 2021-06-24 ENCOUNTER — Encounter (INDEPENDENT_AMBULATORY_CARE_PROVIDER_SITE_OTHER): Payer: Self-pay | Admitting: Ophthalmology

## 2021-06-24 DIAGNOSIS — H353211 Exudative age-related macular degeneration, right eye, with active choroidal neovascularization: Secondary | ICD-10-CM

## 2021-06-24 DIAGNOSIS — H353221 Exudative age-related macular degeneration, left eye, with active choroidal neovascularization: Secondary | ICD-10-CM | POA: Diagnosis not present

## 2021-06-24 MED ORDER — BEVACIZUMAB 2.5 MG/0.1ML IZ SOSY
2.5000 mg | PREFILLED_SYRINGE | INTRAVITREAL | Status: AC | PRN
  Administered 2021-06-24: 2.5 mg via INTRAVITREAL

## 2021-06-24 NOTE — Assessment & Plan Note (Signed)
New onset, temporal to [facility], will need to commence therapy promptly within 1 week

## 2021-06-24 NOTE — Progress Notes (Signed)
06/24/2021     CHIEF COMPLAINT Patient presents for  Chief Complaint  Patient presents with   Retina Follow Up      HISTORY OF PRESENT ILLNESS: Frank Meyer is a 80 y.o. male who presents to the clinic today for:   HPI     Retina Follow Up   Patient presents with  Wet AMD.  In both eyes.  This started 4 months ago.  Duration of 4.        Comments   4 month f/u OU with OCT and possible Avastin injection OS  Pt c/o decreased near vision.      Last edited by Reather Littler, COA on 06/24/2021 11:03 AM.      Referring physician: Burnard Bunting, MD [place],  [place] [address]  HISTORICAL INFORMATION:   Selected notes from the [facility]: No current outpatient medications on file. (Ophthalmic Drugs)   No current facility-administered medications for this visit. (Ophthalmic Drugs)   Current Outpatient Medications (Other)  Medication Sig   albuterol (VENTOLIN HFA) 108 (90 Base) MCG/ACT inhaler Inhale 2 puffs into the lungs every 6 (six) hours as needed for wheezing or shortness of breath. (Patient not taking: Reported on 06/10/2021)   aspirin 81 MG EC tablet Take 1 tablet by mouth in the morning and at bedtime.   atorvastatin (LIPITOR) 20 MG tablet Take 20 mg by mouth daily.   cholecalciferol (VITAMIN D) 1000 UNITS tablet Take 1,000 Units by mouth daily.   donepezil (ARICEPT) 10 MG tablet TAKE 1 TABLET BY MOUTH  DAILY   ENTRESTO 24-26 MG TAKE 1 TABLET BY MOUTH  TWICE DAILY   Fexofenadine-Pseudoephedrine (ALLEGRA-D 24 HOUR PO) Take 1 tablet by mouth daily.   finasteride (PROSCAR) 5 MG tablet Take 5 mg by mouth daily.   ipratropium (ATROVENT) 0.06 % nasal spray Place 2 sprays into both nostrils daily as needed for rhinitis.    Melatonin 10 MG TABS Take 10 mg by mouth at bedtime.   nitroGLYCERIN (NITROSTAT) 0.4 MG SL tablet Place 1 tablet (0.4 mg total) under the tongue every 5 (five) minutes as needed.  (Patient taking differently: Place 0.4 mg under the tongue every 5 (five) minutes as needed for chest pain.)   omeprazole (PRILOSEC) 40 MG capsule Take 40 mg by mouth daily.    OXYGEN Inhale 5 L into the lungs as directed. 5lt during the day and 2lt at night   thiamine 100 MG tablet Take 1 tablet (100 mg total) by mouth daily.   traZODone (DESYREL) 100 MG tablet TAKE 2 TABLETS BY MOUTH AT  BEDTIME (Patient taking differently: Take 100 mg by mouth at bedtime.)   umeclidinium-vilanterol (ANORO ELLIPTA) 62.5-25 MCG/ACT AEPB Inhale 1 puff into the lungs daily.   No current facility-administered medications for this visit. (Other)      REVIEW OF SYSTEMS:    ALLERGIES No Known Allergies  PAST MEDICAL HISTORY Past Medical History:  Diagnosis Date   Allergic rhinitis 10/24/2009   Altered bowel habits 10/04/2020   Basal cell carcinoma    Benign prostatic hyperplasia with lower urinary tract symptoms 10/24/2009   Benign prostatic hypertrophy    Bradycardia 12/06/2019   Callosity 10/16/2013   Cardiomegaly 01/23/2015   Chronic diastolic heart failure ([place]) 12/19/2018   Chronic respiratory failure ([place]) 10/04/2020   Coronary artery calcification seen on CAT scan 05/28/2020   Dyslipidemia    Dyspnea on exertion 03/18/2018  Emphysema lung ([place]) 03/18/2018   Encounter for general adult medical examination without abnormal findings 01/17/2015   Ex-cigarette smoker 01/20/2021   Exudative age-related macular degeneration of left eye with active choroidal neovascularization ([place]) 12/18/2019   Gallbladder problem    Gastroesophageal reflux disease    Hiatal hernia    Hilar adenopathy 03/25/2018   History of smoking 03/18/2018   Hyperlipidemia 10/24/2009   Insomnia 05/09/2013   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   Internal hemorrhoid 10/04/2020   Macular pucker, right eye 04/30/2020   Mediastinal adenopathy 03/25/2018   Memory loss 05/09/2013   Mild cognitive  disorder 10/24/2009   Mild memory disturbance    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   Noninfective gastroenteritis and colitis, unspecified 10/04/2020   Nuclear sclerotic cataract of right eye 12/18/2019   Other specified symptoms and signs involving the digestive system and abdomen 10/04/2020   Overweight 09/14/2018   Oxygen dependent 10/04/2020   Panlobular emphysema ([place]) 03/18/2018   Personal history of colonic polyps 10/04/2020   Posterior vitreous detachment of right eye 12/18/2019   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   Pulmonary hypertension ([place])    Right upper lobe pulmonary nodule 03/25/2018   Syncope and collapse 12/12/2018   TIA (transient ischemic attack)    Transient ischemic attack 10/24/2009   Weight decreased 10/04/2020   Past Surgical History:  Procedure Laterality Date   none      FAMILY HISTORY Family History  Problem Relation Age of Onset   Obesity Mother    Memory loss Mother     SOCIAL HISTORY Social History   Tobacco Use   Smoking status: Former    Packs/day: 2.00    Years: 25.00    Pack years: 50.00    Types: Cigarettes    Quit date: 1994    Years since quitting: 28.8   Smokeless tobacco: Never  Substance Use Topics   Alcohol use: Yes    Alcohol/week: 15.0 - 20.0 standard drinks    Types: 15 - 20 Cans of beer per week   Drug use: Never         OPHTHALMIC EXAM:  Base Eye Exam     Visual Acuity (ETDRS)       Right Left   Dist [place] 20/20 -1 20/25 +2         Tonometry (Tonopen, 11:09 AM)       Right Left   Pressure 11 9         Pupils       Pupils Dark Light Shape React APD   Right PERRL 3 2 Round Brisk None   Left PERRL 3 2 Round Brisk None         Visual Fields (Counting fingers)       Left Right    Full Full         Extraocular Movement       Right Left    Full, Ortho Full, Ortho         Neuro/Psych     Oriented x3: Yes   Mood/Affect: Normal         Dilation     Left  eye: 1.0% Mydriacyl, 2.5% Phenylephrine @ 11:08 AM           Slit Lamp and Fundus Exam     External Exam       Right Left   External Normal Normal  Slit Lamp Exam       Right Left   Lids/Lashes Normal Normal   Conjunctiva/Sclera White and quiet White and quiet   Cornea Clear Clear   Anterior Chamber Deep and quiet Deep and quiet   Iris Round and reactive Round and reactive   Lens 2+ Nuclear sclerosis Posterior chamber intraocular lens   Anterior Vitreous Normal Normal         Fundus Exam       Right Left   Posterior Vitreous  Posterior vitreous detachment   Disc  Normal   C/D Ratio 0.1 0.1   Macula  Retinal pigment epithelial mottling, Early age related macular degeneration, no hemorrhage, Hard drusen, no exudates, Geographic atrophy   Vessels  Normal   Periphery  Normal            IMAGING AND PROCEDURES  Imaging and Procedures for 06/24/21  OCT, Retina - OU - Both Eyes       Right Eye Quality was good. Scan locations included subfoveal. Central Foveal Thickness: 311. Progression has been stable. Findings include no IRF, epiretinal membrane, subretinal fluid.   Left Eye Quality was good. Scan locations included subfoveal. Central Foveal Thickness: 402. Progression has improved. Findings include retinal drusen , no IRF, subretinal hyper-reflective material, subretinal fluid.   Notes OD Minor epiretinal membrane, now with new onset wet AMD subretinal fluid temporal to FAZ, will need treatment OD promptly  OS, subretinal hyper reflective material in the region of newly active CNVM, fibrosis, worse at 4 -month follow-up post Avastin, repeat today.  Short interval follow-up OS       Intravitreal Injection, Pharmacologic Agent - OS - Left Eye       Time Out 06/24/2021. 11:21 AM. Confirmed correct patient, procedure, site, and patient consented.   Anesthesia Topical anesthesia was used. Anesthetic medications included Lidocaine 4%.    Procedure Preparation included 10% betadine to eyelids, 5% betadine to ocular surface, Tobramycin 0.3%. A 30 gauge needle was used.   Injection: 2.5 mg bevacizumab 2.5 MG/0.1ML   Route: Intravitreal, Site: Left Eye   NDC: [phone], Lot: [id number]   Post-op Post injection exam found visual acuity of at least counting fingers. The patient tolerated the procedure well. There were no complications. The patient received written and verbal post procedure care education. Post injection medications included ocuflox.              ASSESSMENT/PLAN:  Exudative age-related macular degeneration of left eye with active choroidal neovascularization (HCC) Today at 65-month follow-up, with recurrence of CNVM at and nasal to the FAZ with subretinal fluid and subretinal hyper reflective material, likely exudate or turbid subretinal fluid  Repeat injection Avastin today follow-up next OS in 6 weeks  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[place]) New onset, temporal to FAZ, will need to commence therapy promptly within 1 week     ICD-10-CM   1. Exudative age-related macular degeneration of left eye with active choroidal neovascularization (HCC)  H35.3221 OCT, Retina - OU - Both Eyes    Intravitreal Injection, Pharmacologic Agent - OS - Left Eye    bevacizumab (AVASTIN) SOSY 2.5 mg    2. 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[place])  H35.3211       1.  OS today in the midst of treatment and extend found to have new onset recurrent CNVM at 79-month interval.  Repeat injection today and we will shorten interval follow-up left  eye to maintain good acuity to 6 weeks  2.  OCT OD found to have new onset subretinal fluid and hyper reflective material subretinal with preserved acuity likely CNVM will need to commence therapy within 1 week OD with intravitreal Avastin  3.  Ophthalmic Meds  Ordered this visit:  Meds ordered this encounter  Medications   bevacizumab (AVASTIN) SOSY 2.5 mg       Return ,,,,,, follow-up 6 weeks, dilate OS, Avastin OCT,,, for dilate, OD, AVASTIN OCT,, within 1 week.  There are no Patient Instructions on file for this visit.   Explained the diagnoses, plan, and follow up with the patient and they expressed understanding.  Patient expressed understanding of the importance of proper follow up care.   Clent Demark Alba Kriesel M.D. Diseases & Surgery of the Retina and Vitreous Retina & Diabetic [place] 06/24/21     Abbreviations: M myopia (nearsighted); A astigmatism; H hyperopia (farsighted); P presbyopia; Mrx spectacle prescription;  CTL contact lenses; OD right eye; OS left eye; OU both eyes  XT exotropia; ET esotropia; PEK punctate epithelial keratitis; PEE punctate epithelial erosions; DES dry eye syndrome; MGD meibomian gland dysfunction; ATs artificial tears; PFAT's preservative free artificial tears; [place] nuclear sclerotic cataract; PSC posterior subcapsular cataract; ERM epi-retinal membrane; PVD posterior vitreous detachment; RD retinal detachment; DM diabetes mellitus; DR diabetic retinopathy; NPDR non-proliferative diabetic retinopathy; PDR proliferative diabetic retinopathy; CSME clinically significant macular edema; DME diabetic macular edema; dbh dot blot hemorrhages; CWS cotton wool spot; POAG primary open angle glaucoma; C/D cup-to-disc ratio; HVF humphrey visual field; GVF goldmann visual field; OCT optical coherence tomography; IOP intraocular pressure; BRVO Branch retinal vein occlusion; CRVO central retinal vein occlusion; CRAO central retinal artery occlusion; BRAO branch retinal artery occlusion; RT retinal tear; SB scleral buckle; PPV pars plana vitrectomy; VH Vitreous hemorrhage; PRP panretinal laser photocoagulation; IVK intravitreal kenalog; VMT vitreomacular traction; MH Macular hole;  NVD neovascularization of the disc; NVE  neovascularization elsewhere; AREDS age related eye disease study; ARMD age related macular degeneration; POAG primary open angle glaucoma; EBMD epithelial/anterior basement membrane dystrophy; ACIOL anterior chamber intraocular lens; IOL intraocular lens; PCIOL posterior chamber intraocular lens; Phaco/IOL phacoemulsification with intraocular lens placement; [place] photorefractive keratectomy; LASIK laser assisted in situ keratomileusis; HTN hypertension; DM diabetes mellitus; COPD chronic obstructive pulmonary disease

## 2021-06-24 NOTE — Assessment & Plan Note (Signed)
Today at 56-month follow-up, with recurrence of CNVM at and nasal to the FAZ with subretinal fluid and subretinal hyper reflective material, likely exudate or turbid subretinal fluid  Repeat injection Avastin today follow-up next OS in 6 weeks

## 2021-06-25 ENCOUNTER — Ambulatory Visit (HOSPITAL_BASED_OUTPATIENT_CLINIC_OR_DEPARTMENT_OTHER): Payer: Medicare Other

## 2021-06-27 ENCOUNTER — Other Ambulatory Visit: Payer: Self-pay

## 2021-06-27 ENCOUNTER — Ambulatory Visit (HOSPITAL_BASED_OUTPATIENT_CLINIC_OR_DEPARTMENT_OTHER)
Admission: RE | Admit: 2021-06-27 | Discharge: 2021-06-27 | Disposition: A | Discharge status: Home / Self Care | Payer: Medicare Other | Source: Ambulatory Visit | Attending: Acute Care | Admitting: Acute Care

## 2021-06-27 DIAGNOSIS — R911 Solitary pulmonary nodule: Secondary | ICD-10-CM | POA: Diagnosis present

## 2021-07-02 ENCOUNTER — Encounter (INDEPENDENT_AMBULATORY_CARE_PROVIDER_SITE_OTHER): Payer: Self-pay | Admitting: Ophthalmology

## 2021-07-02 ENCOUNTER — Ambulatory Visit (INDEPENDENT_AMBULATORY_CARE_PROVIDER_SITE_OTHER): Payer: Medicare Other | Admitting: Ophthalmology

## 2021-07-02 ENCOUNTER — Other Ambulatory Visit: Payer: Self-pay

## 2021-07-02 DIAGNOSIS — H353211 Exudative age-related macular degeneration, right eye, with active choroidal neovascularization: Secondary | ICD-10-CM

## 2021-07-02 DIAGNOSIS — H353221 Exudative age-related macular degeneration, left eye, with active choroidal neovascularization: Secondary | ICD-10-CM | POA: Diagnosis not present

## 2021-07-02 MED ORDER — BEVACIZUMAB 2.5 MG/0.1ML IZ SOSY
2.5000 mg | PREFILLED_SYRINGE | INTRAVITREAL | Status: AC | PRN
  Administered 2021-07-02: 2.5 mg via INTRAVITREAL

## 2021-07-02 NOTE — Assessment & Plan Note (Addendum)
Improved 8 days post most recent injection Avastin OS

## 2021-07-02 NOTE — Progress Notes (Signed)
07/02/2021     CHIEF COMPLAINT Patient presents for  Chief Complaint  Patient presents with   Retina Follow Up      HISTORY OF PRESENT ILLNESS: Frank Meyer is a 80 y.o. male who presents to the clinic today for:   HPI     Retina Follow Up   Patient presents with  Wet AMD.  In both eyes.  This started 1 month ago.  Severity is mild.  Duration of 1.        Comments   1 week fu OD oct avastin OD. Patient states vision is stable and unchanged since last visit. Denies any new floaters or FOL.       Last edited by Laurin Coder on 07/02/2021  1:19 PM.      Referring physician: Burnard Bunting, MD [address] [place],   [address]  HISTORICAL INFORMATION:   Selected notes from the [facility]: No current outpatient medications on file. (Ophthalmic Drugs)   No current facility-administered medications for this visit. (Ophthalmic Drugs)   Current Outpatient Medications (Other)  Medication Sig   albuterol (VENTOLIN HFA) 108 (90 Base) MCG/ACT inhaler Inhale 2 puffs into the lungs every 6 (six) hours as needed for wheezing or shortness of breath. (Patient not taking: Reported on 06/10/2021)   aspirin 81 MG EC tablet Take 1 tablet by mouth in the morning and at bedtime.   atorvastatin (LIPITOR) 20 MG tablet Take 20 mg by mouth daily.   cholecalciferol (VITAMIN D) 1000 UNITS tablet Take 1,000 Units by mouth daily.   donepezil (ARICEPT) 10 MG tablet TAKE 1 TABLET BY MOUTH  DAILY   ENTRESTO 24-26 MG TAKE 1 TABLET BY MOUTH  TWICE DAILY   Fexofenadine-Pseudoephedrine (ALLEGRA-D 24 HOUR PO) Take 1 tablet by mouth daily.   finasteride (PROSCAR) 5 MG tablet Take 5 mg by mouth daily.   ipratropium (ATROVENT) 0.06 % nasal spray Place 2 sprays into both nostrils daily as needed for rhinitis.    Melatonin 10 MG TABS Take 10 mg by mouth at bedtime.   nitroGLYCERIN (NITROSTAT) 0.4 MG SL tablet Place 1 tablet (0.4 mg total)  under the tongue every 5 (five) minutes as needed. (Patient taking differently: Place 0.4 mg under the tongue every 5 (five) minutes as needed for chest pain.)   omeprazole (PRILOSEC) 40 MG capsule Take 40 mg by mouth daily.    OXYGEN Inhale 5 L into the lungs as directed. 5lt during the day and 2lt at night   thiamine 100 MG tablet Take 1 tablet (100 mg total) by mouth daily.   traZODone (DESYREL) 100 MG tablet TAKE 2 TABLETS BY MOUTH AT  BEDTIME (Patient taking differently: Take 100 mg by mouth at bedtime.)   umeclidinium-vilanterol (ANORO ELLIPTA) 62.5-25 MCG/ACT AEPB Inhale 1 puff into the lungs daily.   No current facility-administered medications for this visit. (Other)      REVIEW OF SYSTEMS:    ALLERGIES No Known Allergies  PAST MEDICAL HISTORY Past Medical History:  Diagnosis Date   Allergic rhinitis 10/24/2009   Altered bowel habits 10/04/2020   Basal cell carcinoma    Benign prostatic hyperplasia with lower urinary tract symptoms 10/24/2009   Benign prostatic hypertrophy    Bradycardia 12/06/2019   Callosity 10/16/2013   Cardiomegaly 01/23/2015   Chronic diastolic heart failure ([place]) 12/19/2018   Chronic respiratory failure ([place]) 10/04/2020   Coronary artery calcification seen on CAT scan 05/28/2020  Dyslipidemia    Dyspnea on exertion 03/18/2018   Emphysema lung ([place]) 03/18/2018   Encounter for general adult medical examination without abnormal findings 01/17/2015   Ex-cigarette smoker 01/20/2021   Exudative age-related macular degeneration of left eye with active choroidal neovascularization ([place]) 12/18/2019   Gallbladder problem    Gastroesophageal reflux disease    Hiatal hernia    Hilar adenopathy 03/25/2018   History of smoking 03/18/2018   Hyperlipidemia 10/24/2009   Insomnia 05/09/2013   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   Internal hemorrhoid 10/04/2020   Macular pucker, right eye 04/30/2020   Mediastinal adenopathy  03/25/2018   Memory loss 05/09/2013   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   Mild memory disturbance    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   Noninfective gastroenteritis and colitis, unspecified 10/04/2020   Nuclear sclerotic cataract of right eye 12/18/2019   Other specified symptoms and signs involving the digestive system and abdomen 10/04/2020   Overweight 09/14/2018   Oxygen dependent 10/04/2020   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   Personal history of colonic polyps 10/04/2020   Posterior vitreous detachment of right eye 12/18/2019   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   Pulmonary hypertension ([place])    Right upper lobe pulmonary nodule 03/25/2018   Syncope and collapse 12/12/2018   TIA (transient ischemic attack)    Transient ischemic attack 10/24/2009   Weight decreased 10/04/2020   Past Surgical History:  Procedure Laterality Date   none      FAMILY HISTORY Family History  Problem Relation Age of Onset   Obesity Mother    Memory loss Mother     SOCIAL HISTORY Social History   Tobacco Use   Smoking status: Former    Packs/day: 2.00    Years: 25.00    Pack years: 50.00    Types: Cigarettes    Quit date: 1994    Years since quitting: 28.8   Smokeless tobacco: Never  Substance Use Topics   Alcohol use: Yes    Alcohol/week: 15.0 - 20.0 standard drinks    Types: 15 - 20 Cans of beer per week   Drug use: Never         OPHTHALMIC EXAM:  Base Eye Exam     Visual Acuity (ETDRS)       Right Left   Dist [place] 20/25 -2 20/30 -2         Tonometry (Tonopen, 1:16 PM)       Right Left   Pressure 13 10         Pupils       Pupils Dark Light Shape React APD   Right PERRL 3 2 Round Brisk None   Left PERRL 3 2 Round Brisk None         Extraocular Movement       Right Left    Full Full         Neuro/Psych     Oriented x3: Yes   Mood/Affect: Normal         Dilation     Right eye: 1.0% Mydriacyl, 2.5% Phenylephrine @  1:18 PM           Slit Lamp and Fundus Exam     External Exam       Right Left   External Normal Normal         Slit Lamp Exam       Right Left   Lids/Lashes Normal Normal  Conjunctiva/Sclera White and quiet White and quiet   Cornea Clear Clear   Anterior Chamber Deep and quiet Deep and quiet   Iris Round and reactive Round and reactive   Lens 2+ Nuclear sclerosis Posterior chamber intraocular lens   Anterior Vitreous Normal Normal         Fundus Exam       Right Left   Posterior Vitreous Normal    Disc Normal    C/D Ratio 0.1    Macula Early age related macular degeneration, Hard drusen, Macular thickening, Subretinal neovascular membrane, Subretinal hemorrhage inf temporal to faz    Vessels Normal    Periphery Normal             IMAGING AND PROCEDURES  Imaging and Procedures for 07/02/21  Intravitreal Injection, Pharmacologic Agent - OD - Right Eye       Time Out 07/02/2021. 1:30 PM. Confirmed correct patient, procedure, site, and patient consented.   Anesthesia Topical anesthesia was used. Anesthetic medications included Lidocaine 4%.   Procedure Preparation included 5% betadine to ocular surface, 10% betadine to eyelids. A 30 gauge needle was used.   Injection: 2.5 mg bevacizumab 2.5 MG/0.1ML   Route: Intravitreal, Site: Right Eye   NDC: [phone], Lot: [id number]   Post-op Post injection exam found visual acuity of at least counting fingers. The patient tolerated the procedure well. There were no complications. The patient received written and verbal post procedure care education. Post injection medications included ocuflox.      OCT, Retina - OU - Both Eyes       Right Eye Quality was good. Scan locations included subfoveal. Central Foveal Thickness: 309. Progression has been stable. Findings include no IRF, epiretinal membrane, subretinal fluid.   Left Eye Quality was good. Scan locations included subfoveal. Central Foveal  Thickness: 373. Progression has improved. Findings include retinal drusen , no IRF, subretinal hyper-reflective material, subretinal fluid.   Notes OD Minor epiretinal membrane, now with new onset wet AMD subretinal fluid temporal to FAZ, will need treatment OD today  OS, subretinal hyper reflective material in the region of newly active CNVM, fibrosis, improved post recent injection Avastin some 8 days previous OS Short interval follow-up OS               ASSESSMENT/PLAN:  Exudative age-related macular degeneration of left eye with active choroidal neovascularization (HCC) Improved 8 days post most recent injection Avastin OS  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[place]) OD with new onset CNVM, found 8 days previous on OCT confirmed today on clinical examination  Commence therapy with intravitreal Avastin OD today follow-up again in 4 to 5-week     ICD-10-CM   1. 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HCC)  H35.3211 Intravitreal Injection, Pharmacologic Agent - OD - Right Eye    OCT, Retina - OU - Both Eyes    bevacizumab (AVASTIN) SOSY 2.5 mg    2. Exudative age-related macular degeneration of left eye with active choroidal neovascularization ([place])  H35.3221       1.  History of chronic recurrent CNVM OS, improved now 8 days most recent injection.  2.  OD found to have new onset CNVM 8 days previous, to commence therapy intravitreal Avastin OD today follow-up next in 5 weeks OD injection likely  3.  Ophthalmic Meds Ordered this visit:  Meds ordered this encounter  Medications   bevacizumab (AVASTIN) SOSY 2.5 mg  Return in about 5 weeks (around 08/06/2021) for dilate, OD, AVASTIN OCT,, and follow-up OS as scheduled.  There are no Patient Instructions on file for this visit.   Explained the diagnoses, plan, and follow up with the patient and  they expressed understanding.  Patient expressed understanding of the importance of proper follow up care.   Clent Demark Briante Loveall M.D. Diseases & Surgery of the Retina and Vitreous [facility]ina [facility] [place] 07/02/21     Abbreviations: M myopia (nearsighted); A astigmatism; H hyperopia (farsighted); P presbyopia; Mrx spectacle prescription;  CTL contact lenses; OD right eye; OS left eye; OU both eyes  XT exotropia; ET esotropia; PEK punctate epithelial keratitis; PEE punctate epithelial erosions; DES dry eye syndrome; MGD meibomian gland dysfunction; ATs artificial tears; PFAT's preservative free artificial tears; [place] nuclear sclerotic cataract; PSC posterior subcapsular cataract; ERM epi-retinal membrane; PVD posterior vitreous detachment; RD retinal detachment; DM diabetes mellitus; DR diabetic retinopathy; NPDR non-proliferative diabetic retinopathy; PDR proliferative diabetic retinopathy; CSME clinically significant macular edema; DME diabetic macular edema; dbh dot blot hemorrhages; CWS cotton wool spot; POAG primary open angle glaucoma; C/D cup-to-disc ratio; HVF humphrey visual field; GVF goldmann visual field; OCT optical coherence tomography; IOP intraocular pressure; BRVO Branch retinal vein occlusion; CRVO central retinal vein occlusion; CRAO central retinal artery occlusion; BRAO branch retinal artery occlusion; RT retinal tear; SB scleral buckle; PPV pars plana vitrectomy; VH Vitreous hemorrhage; PRP panretinal laser photocoagulation; IVK intravitreal kenalog; VMT vitreomacular traction; MH Macular hole;  NVD neovascularization of the disc; NVE neovascularization elsewhere; AREDS age related eye disease study; ARMD age related macular degeneration; POAG primary open angle glaucoma; EBMD epithelial/anterior basement membrane dystrophy; ACIOL anterior chamber intraocular lens; IOL intraocular lens; PCIOL posterior chamber intraocular lens; Phaco/IOL phacoemulsification with intraocular lens  placement; [place] photorefractive keratectomy; LASIK laser assisted in situ keratomileusis; HTN hypertension; DM diabetes mellitus; COPD chronic obstructive pulmonary disease

## 2021-07-02 NOTE — Assessment & Plan Note (Signed)
OD with new onset CNVM, found 8 days previous on OCT confirmed today on clinical examination  Commence therapy with intravitreal Avastin OD today follow-up again in 4 to 5-week

## 2021-07-04 ENCOUNTER — Telehealth: Payer: Self-pay | Admitting: Pulmonary Disease

## 2021-07-04 NOTE — Telephone Encounter (Signed)
I have attempted to call [facility] but have been on hold for about 15 mins.  Will try back again

## 2021-07-04 NOTE — Telephone Encounter (Signed)
[facility] has access to our records through epic  Closing encounter and community msg sent to make them aware we got this request

## 2021-07-14 ENCOUNTER — Other Ambulatory Visit: Payer: Self-pay

## 2021-07-15 ENCOUNTER — Ambulatory Visit: Payer: Medicare Other | Admitting: Cardiology

## 2021-07-15 ENCOUNTER — Other Ambulatory Visit: Payer: Self-pay

## 2021-07-15 ENCOUNTER — Encounter: Payer: Self-pay | Admitting: Cardiology

## 2021-07-15 VITALS — BP 126/72 | HR 58 | Ht 66.5 in | Wt 170.0 lb

## 2021-07-15 DIAGNOSIS — Z9981 Dependence on supplemental oxygen: Secondary | ICD-10-CM

## 2021-07-15 DIAGNOSIS — E785 Hyperlipidemia, unspecified: Secondary | ICD-10-CM

## 2021-07-15 DIAGNOSIS — I5032 Chronic diastolic (congestive) heart failure: Secondary | ICD-10-CM

## 2021-07-15 DIAGNOSIS — I272 Pulmonary hypertension, unspecified: Secondary | ICD-10-CM | POA: Diagnosis not present

## 2021-07-15 MED ORDER — NITROGLYCERIN 0.4 MG SL SUBL: 0.4000 mg | SUBLINGUAL_TABLET | SUBLINGUAL | 6 refills | Status: AC | PRN

## 2021-07-15 NOTE — Addendum Note (Signed)
Addended by: Jerl Santos R on: 07/15/2021 12:03 PM   Modules accepted: Orders

## 2021-07-15 NOTE — Addendum Note (Signed)
Addended by: Jerl Santos R on: 07/15/2021 11:59 AM   Modules accepted: Orders

## 2021-07-15 NOTE — Progress Notes (Signed)
Cardiology Office Note:    Date:  07/15/2021   ID:  Frank Meyer, DOB 10/03/1940, MRN [id number]  PCP:  Burnard Bunting, MD  Cardiologist:  Jenne Campus, MD    Referring MD: Burnard Bunting, MD   Chief Complaint  Patient presents with   medication mangement     Nitroglycerin   I am doing fine, still gets ability to play golf  History of Present Illness:    Frank Meyer is a 80 y.o. male   with past medical history significant for pulmonary hypertension which we think is most likely group 3 with some component of group 2.  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  He did have evaluation for coronary artery disease which showed some calcification with calcium score 400 however fractional flow reserve thereafter did not show any significant hemodynamically coronary artery disease.  He is being managed by pulmonary with oxygen for his COPD/emphysema and actually he is using oxygen all the time feels better but still some shortness of breath.  Still trying to play golf on the regular basis he does about 10,000 steps every single day.   Comes today to my office for follow-up.  Overall he seems to be doing well.  Still gets some shortness of breath and also noted desaturation while walking.  Still trying to be active playing golf and walking around.  Denies have any chest pain tightness squeezing pressure burning chest  Past Medical History:  Diagnosis Date   Allergic rhinitis 10/24/2009   Altered bowel habits 10/04/2020   Basal cell carcinoma    Benign prostatic hyperplasia with lower urinary tract symptoms 10/24/2009   Benign prostatic hypertrophy    Bradycardia 12/06/2019   Callosity 10/16/2013   Cardiomegaly 01/23/2015   Chronic diastolic heart failure ([place]) 12/19/2018   Chronic respiratory failure ([place]) 10/04/2020   Coronary artery calcification seen on CAT scan 05/28/2020   Dyslipidemia    Dyspnea on exertion 03/18/2018   Emphysema  lung ([place]) 03/18/2018   Encounter for general adult medical examination without abnormal findings 01/17/2015   Ex-cigarette smoker 01/20/2021   Exudative age-related macular degeneration of left eye with active choroidal neovascularization ([place]) 12/18/2019   Gallbladder problem    Gastroesophageal reflux disease    Hiatal hernia    Hilar adenopathy 03/25/2018   History of smoking 03/18/2018   Hyperlipidemia 10/24/2009   Insomnia 05/09/2013   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   Internal hemorrhoid 10/04/2020   Macular pucker, right eye 04/30/2020   Mediastinal adenopathy 03/25/2018   Memory loss 05/09/2013   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   Mild memory disturbance    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   Noninfective gastroenteritis and colitis, unspecified 10/04/2020   Nuclear sclerotic cataract of right eye 12/18/2019   Other specified symptoms and signs involving the digestive system and abdomen 10/04/2020   Overweight 09/14/2018   Oxygen dependent 10/04/2020   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   atorvastatin (LIPITOR) 20 MG tablet Take 20 mg by mouth daily.   cholecalciferol (VITAMIN D) 1000 UNITS tablet Take 1,000 Units by mouth daily.   donepezil (ARICEPT) 10 MG tablet TAKE 1 TABLET BY MOUTH  DAILY (Patient taking differently: Take 10 mg by mouth at bedtime.)    ENTRESTO 24-26 MG TAKE 1 TABLET BY MOUTH  TWICE DAILY (Patient taking differently: Take 1 tablet by mouth 2 (two) times daily.)   Fexofenadine-Pseudoephedrine (ALLEGRA-D 24 HOUR PO) Take 1 tablet by mouth daily.   finasteride (PROSCAR) 5 MG tablet Take 5 mg by mouth daily.   ipratropium (ATROVENT) 0.06 % nasal spray Place 2 sprays into both nostrils daily as needed for rhinitis.    Melatonin 10 MG TABS Take 10 mg by mouth at bedtime.   nitroGLYCERIN (NITROSTAT) 0.4 MG SL tablet Place 1 tablet (0.4 mg total) under the tongue every 5 (five) minutes as needed. (Patient taking differently: Place 0.4 mg under the tongue every 5 (five) minutes as needed for chest pain.)   omeprazole (PRILOSEC) 40 MG capsule Take 40 mg by mouth daily.    OXYGEN Inhale 5 L into the lungs as directed. 5lt during the day and 2lt at night   thiamine 100 MG tablet Take 1 tablet (100 mg total) by mouth daily.   traZODone (DESYREL) 100 MG tablet TAKE 2 TABLETS BY MOUTH AT  BEDTIME (Patient taking differently: Take 100 mg by mouth at bedtime.)     Allergies:   Patient has no known allergies.   Social History   Socioeconomic History   Marital status: Married    Spouse name: Frank Meyer   Number of children: 2   Years of education: college   Highest education level: Not on file  Occupational History   Occupation: retired  Tobacco Use   Smoking status: Former    Packs/day: 2.00    Years: 25.00    Pack years: 50.00    Types: Cigarettes    Quit date: 1994    Years since quitting: 28.9   Smokeless tobacco: Never  Substance and Sexual Activity   Alcohol use: Yes    Alcohol/week: 15.0 - 20.0 standard drinks    Types: 15 - 20 Cans of beer per week   Drug use: Never   Sexual activity: Not on file  Other Topics Concern   Not on file  Social History Narrative   Pt is retired. He is married. The pt lives with his wife. He has a Best boy. The pt has 2 children.      Patient drinks 2-3 cups of caffeine daily.    Patient is right handed.    Social Determinants of Health   Financial Resource Strain: Not on file  Food Insecurity: Not on file  Transportation Needs: Not on file  Physical Activity: Not on file  Stress: Not on file  Social Connections: Not on file     Family History: The patient's family history includes Memory loss in his mother; Obesity in his mother. ROS:   Please see the history of present illness.    All 14 point review of systems negative except as described per history of present illness  EKGs/Labs/Other Studies Reviewed:      Recent Labs: 10/15/2020: NT-Pro BNP 223 10/28/2020: BUN 15; Creatinine, Ser 1.18; Potassium 4.6; Sodium 139  Recent Lipid Panel    Component Value Date/Time   CHOL  147 06/13/2020 0819   TRIG 129 06/13/2020 0819   HDL 57 06/13/2020 0819   CHOLHDL 2.6 06/13/2020 0819   LDLCALC 67 06/13/2020 0819    Physical Exam:    VS:  BP 126/72 (BP Location: Right Arm, Patient Position: Sitting)   Pulse (!) 58   Ht 5' 6.5" (1.689 m)   Wt 170 lb (77.1 kg)   SpO2 96%   BMI 27.03 kg/m     Wt Readings from Last 3 Encounters:  07/15/21 170 lb (77.1 kg)  06/10/21 168 lb (76.2 kg)  05/29/21 171 lb 3.2 oz (77.7 kg)     GEN:  Well nourished, well developed in no acute distress HEENT: Normal NECK: No JVD; No carotid bruits LYMPHATICS: No lymphadenopathy CARDIAC: RRR, no murmurs, no rubs, no gallops RESPIRATORY:  Clear to auscultation without rales, wheezing or rhonchi  ABDOMEN: Soft, non-tender, non-distended MUSCULOSKELETAL:  No edema; No deformity  SKIN: Warm and dry LOWER EXTREMITIES: no swelling NEUROLOGIC:  Alert and oriented x 3 PSYCHIATRIC:  Normal affect   ASSESSMENT:    1. Pulmonary hypertension (HCC)   2. Chronic diastolic heart failure ([place])   3. Dyslipidemia   4. Oxygen dependent    PLAN:    In order of problems listed above:  Pulmonary hypertension last echocardiogram showed normal pulm artery pressure we will continue  monitoring I will reorder his echocardiogram at the beginning of next year.  To check on that. Chronic diastolic congestive heart failure physical exam compensated. Dyslipidemia I did review his K PN which show me his LDL of 81 and HDL 62.  We will continue present management. Oxygen dependent secondary to chronic lung condition that being followed by pulmonary.   Medication Adjustments/Labs and Tests Ordered: Current medicines are reviewed at length with the patient today.  Concerns regarding medicines are outlined above.  No orders of the defined types were placed in this encounter.  Medication changes: No orders of the defined types were placed in this encounter.   Signed, Park Liter, MD, [facility] 07/15/2021 11:50 AM    [place]

## 2021-07-15 NOTE — Patient Instructions (Addendum)
Medication Instructions:  Your physician recommends that you continue on your current medications as directed. Please refer to the Current Medication list given to you today.  *If you need a refill on your cardiac medications before your next appointment, please call your pharmacy*   Lab Work: NONE If you have labs (blood work) drawn today and your tests are completely normal, you will receive your results only by: [place] (if you have MyChart) OR A paper copy in the mail If you have any lab test that is abnormal or we need to change your treatment, we will call you to review the results.   Testing/Procedures: Your physician has requested that you have an echocardiogram. Echocardiography is a painless test that uses sound waves to create images of your heart. It provides your doctor with information about the size and shape of your heart and how well your heart's chambers and valves are working. This procedure takes approximately one hour. There are no restrictions for this procedure.    Follow-Up: At [facility], you and your health needs are our priority.  As part of our continuing mission to provide you with exceptional heart care, we have created designated Provider Care Teams.  These Care Teams include your primary Cardiologist (physician) and Advanced Practice Providers (APPs -  Physician Assistants and Nurse Practitioners) who all work together to provide you with the care you need, when you need it.  We recommend signing up for the patient portal called "MyChart".  Sign up information is provided on this After Visit Summary.  MyChart is used to connect with patients for Virtual Visits (Telemedicine).  Patients are able to view lab/test results, encounter notes, upcoming appointments, etc.  Non-urgent messages can be sent to your provider as well.   To learn more about what you can do with MyChart, go to [email or web address].    Your next appointment:   6 month(s)  The  format for your next appointment:   In Person  Provider:   Jenne Campus, MD    Other Instructions

## 2021-08-05 ENCOUNTER — Other Ambulatory Visit: Payer: Self-pay

## 2021-08-05 ENCOUNTER — Ambulatory Visit (INDEPENDENT_AMBULATORY_CARE_PROVIDER_SITE_OTHER): Payer: Medicare Other | Admitting: Ophthalmology

## 2021-08-05 ENCOUNTER — Encounter (INDEPENDENT_AMBULATORY_CARE_PROVIDER_SITE_OTHER): Payer: Self-pay | Admitting: Ophthalmology

## 2021-08-05 DIAGNOSIS — H353221 Exudative age-related macular degeneration, left eye, with active choroidal neovascularization: Secondary | ICD-10-CM | POA: Diagnosis not present

## 2021-08-05 DIAGNOSIS — H353211 Exudative age-related macular degeneration, right eye, with active choroidal neovascularization: Secondary | ICD-10-CM | POA: Diagnosis not present

## 2021-08-05 MED ORDER — BEVACIZUMAB 2.5 MG/0.1ML IZ SOSY
2.5000 mg | PREFILLED_SYRINGE | INTRAVITREAL | Status: AC | PRN
  Administered 2021-08-05: 11:00:00 2.5 mg via INTRAVITREAL

## 2021-08-05 NOTE — Assessment & Plan Note (Signed)
Recent activation OD, vastly improved today at 5-week follow-up.  We will repeat injection today and examination again in 6 weeks

## 2021-08-05 NOTE — Progress Notes (Signed)
08/05/2021     CHIEF COMPLAINT Patient presents for  Chief Complaint  Patient presents with   Macular Degeneration      HISTORY OF PRESENT ILLNESS: Frank Meyer is a 80 y.o. male who presents to the clinic today for:   HPI   Some 5 weeks postinjection OD and some 6  weeks postinjection OS follow-up of ARMD with no interval change in acuity per patient report. Last edited by Hurman Horn, MD on 08/05/2021 10:38 AM.      Referring physician: Burnard Bunting, MD [address] [place],   [id number]  HISTORICAL INFORMATION:   Selected notes from the [facility]: No current outpatient medications on file. (Ophthalmic Drugs)   No current facility-administered medications for this visit. (Ophthalmic Drugs)   Current Outpatient Medications (Other)  Medication Sig   aspirin 81 MG EC tablet Take 1 tablet by mouth in the morning and at bedtime.   atorvastatin (LIPITOR) 20 MG tablet Take 20 mg by mouth daily.   cholecalciferol (VITAMIN D) 1000 UNITS tablet Take 1,000 Units by mouth daily.   donepezil (ARICEPT) 10 MG tablet TAKE 1 TABLET BY MOUTH  DAILY (Patient taking differently: Take 10 mg by mouth at bedtime.)   ENTRESTO 24-26 MG TAKE 1 TABLET BY MOUTH  TWICE DAILY (Patient taking differently: Take 1 tablet by mouth 2 (two) times daily.)   Fexofenadine-Pseudoephedrine (ALLEGRA-D 24 HOUR PO) Take 1 tablet by mouth daily.   finasteride (PROSCAR) 5 MG tablet Take 5 mg by mouth daily.   ipratropium (ATROVENT) 0.06 % nasal spray Place 2 sprays into both nostrils daily as needed for rhinitis.    Melatonin 10 MG TABS Take 10 mg by mouth at bedtime.   nitroGLYCERIN (NITROSTAT) 0.4 MG SL tablet Place 1 tablet (0.4 mg total) under the tongue every 5 (five) minutes as needed. Chest pain   omeprazole (PRILOSEC) 40 MG capsule Take 40 mg by mouth daily.    OXYGEN Inhale 5 L into the lungs as directed. 5lt during the day and 2lt at night    thiamine 100 MG tablet Take 1 tablet (100 mg total) by mouth daily.   traZODone (DESYREL) 100 MG tablet TAKE 2 TABLETS BY MOUTH AT  BEDTIME (Patient taking differently: Take 100 mg by mouth at bedtime.)   No current facility-administered medications for this visit. (Other)      REVIEW OF SYSTEMS: ROS   Negative for: Constitutional, Gastrointestinal, Neurological, Skin, Genitourinary, Musculoskeletal, HENT, Endocrine, Cardiovascular, Eyes, Respiratory, Psychiatric, Allergic/Imm, Heme/Lymph Last edited by Hurman Horn, MD on 08/05/2021 10:37 AM.       ALLERGIES No Known Allergies  PAST MEDICAL HISTORY Past Medical History:  Diagnosis Date   Allergic rhinitis 10/24/2009   Altered bowel habits 10/04/2020   Basal cell carcinoma    Benign prostatic hyperplasia with lower urinary tract symptoms 10/24/2009   Benign prostatic hypertrophy    Bradycardia 12/06/2019   Callosity 10/16/2013   Cardiomegaly 01/23/2015   Chronic diastolic heart failure ([place]) 12/19/2018   Chronic respiratory failure ([place]) 10/04/2020   Coronary artery calcification seen on CAT scan 05/28/2020   Dyslipidemia    Dyspnea on exertion 03/18/2018   Emphysema lung ([place]) 03/18/2018   Encounter for general adult medical examination without abnormal findings 01/17/2015   Ex-cigarette smoker 01/20/2021   Exudative age-related macular degeneration of left eye with active choroidal neovascularization ([place]) 12/18/2019   Gallbladder problem    Gastroesophageal reflux  disease    Hiatal hernia    Hilar adenopathy 03/25/2018   History of smoking 03/18/2018   Hyperlipidemia 10/24/2009   Insomnia 05/09/2013   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   Internal hemorrhoid 10/04/2020   Macular pucker, right eye 04/30/2020   Mediastinal adenopathy 03/25/2018   Memory loss 05/09/2013   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   Mild memory disturbance    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    Noninfective gastroenteritis and colitis, unspecified 10/04/2020   Nuclear sclerotic cataract of right eye 12/18/2019   Other specified symptoms and signs involving the digestive system and abdomen 10/04/2020   Overweight 09/14/2018   Oxygen dependent 10/04/2020   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   Personal history of colonic polyps 10/04/2020   Posterior vitreous detachment of right eye 12/18/2019   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   Pulmonary hypertension ([place])    Right upper lobe pulmonary nodule 03/25/2018   Syncope and collapse 12/12/2018   TIA (transient ischemic attack)    Transient ischemic attack 10/24/2009   Weight decreased 10/04/2020   Past Surgical History:  Procedure Laterality Date   none      FAMILY HISTORY Family History  Problem Relation Age of Onset   Obesity Mother    Memory loss Mother     SOCIAL HISTORY Social History   Tobacco Use   Smoking status: Former    Packs/day: 2.00    Years: 25.00    Pack years: 50.00    Types: Cigarettes    Quit date: 1994    Years since quitting: 28.9   Smokeless tobacco: Never  Substance Use Topics   Alcohol use: Yes    Alcohol/week: 15.0 - 20.0 standard drinks    Types: 15 - 20 Cans of beer per week   Drug use: Never         OPHTHALMIC EXAM:  Base Eye Exam     Visual Acuity (ETDRS)       Right Left   Dist [place] 20/20 20/25         Tonometry (Tonopen, 10:37 AM)       Right Left   Pressure 12 12         Pupils       Pupils APD   Right PERRL None   Left PERRL None         Visual Fields       Left Right    Full Full         Extraocular Movement       Right Left    Full, Ortho Full, Ortho         Neuro/Psych     Oriented x3: Yes   Mood/Affect: Normal         Dilation     Both eyes: 1.0% Mydriacyl, 2.5% Phenylephrine @ 10:38 AM           Slit Lamp and Fundus Exam     External Exam       Right Left   External Normal Normal         Slit  Lamp Exam       Right Left   Lids/Lashes Normal Normal   Conjunctiva/Sclera White and quiet White and quiet   Cornea Clear Clear   Anterior Chamber Deep and quiet Deep and quiet   Iris Round and reactive Round and reactive   Lens 2+ Nuclear sclerosis Posterior chamber intraocular lens  Anterior Vitreous Normal Normal         Fundus Exam       Right Left   Posterior Vitreous Normal Posterior vitreous detachment   Disc Normal Normal   C/D Ratio 0.1 0.1   Macula Early age related macular degeneration, Hard drusen, no macular thickening Retinal pigment epithelial mottling, Early age related macular degeneration, no hemorrhage, Hard drusen, no exudates, Geographic atrophy, no macular thickening   Vessels Normal Normal   Periphery Normal Normal            IMAGING AND PROCEDURES  Imaging and Procedures for 08/05/21  OCT, Retina - OU - Both Eyes       Right Eye Quality was good. Scan locations included subfoveal. Central Foveal Thickness: 303. Progression has been stable. Findings include no IRF, epiretinal membrane, subretinal fluid.   Left Eye Quality was good. Scan locations included subfoveal. Central Foveal Thickness: 307. Progression has improved. Findings include retinal drusen , no IRF, subretinal hyper-reflective material, subretinal fluid.   Notes OD Minor epiretinal membrane, much improved at 5 weeks post recent  new wet AMD subretinal fluid temporal to FAZ, will need treatment OD today, at 5 weeks  OS, subretinal hyper reflective material in the region much less active active CNVM, fibrosis, improved post recent injection Avastin 6 weeks postinjection OS      Intravitreal Injection, Pharmacologic Agent - OD - Right Eye       Time Out 08/05/2021. 11:01 AM. Confirmed correct patient, procedure, site, and patient consented.   Anesthesia Topical anesthesia was used. Anesthetic medications included Lidocaine 4%.   Procedure Preparation included 5% betadine  to ocular surface, 10% betadine to eyelids. A 30 gauge needle was used.   Injection: 2.5 mg bevacizumab 2.5 MG/0.1ML   Route: Intravitreal, Site: Right Eye   NDC: [phone], Lot: [id number]   Post-op Post injection exam found visual acuity of at least counting fingers. The patient tolerated the procedure well. There were no complications. The patient received written and verbal post procedure care education. Post injection medications included ocuflox.      Intravitreal Injection, Pharmacologic Agent - OS - Left Eye       Time Out 08/05/2021. 11:02 AM. Confirmed correct patient, procedure, site, and patient consented.   Anesthesia Topical anesthesia was used. Anesthetic medications included Lidocaine 4%.   Procedure Preparation included 10% betadine to eyelids, 5% betadine to ocular surface, Tobramycin 0.3%. A 30 gauge needle was used.   Injection: 2.5 mg bevacizumab 2.5 MG/0.1ML   Route: Intravitreal, Site: Left Eye   NDC: [phone], Lot: [id number]   Post-op Post injection exam found visual acuity of at least counting fingers. The patient tolerated the procedure well. There were no complications. The patient received written and verbal post procedure care education. Post injection medications included ocuflox.              ASSESSMENT/PLAN:  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HCC) Recent activation OD, vastly improved today at 5-week follow-up.  We will repeat injection today and examination again in 6 weeks  Exudative age-related macular degeneration of left eye with active choroidal neovascularization ([place]) Vastly improved macular findings left eye at 6 weeks repeat injection today reevaluate in 6 weeks     ICD-10-CM   1. 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HCC)  H35.3211 OCT, Retina - OU - Both Eyes    Intravitreal Injection, Pharmacologic  Agent - OD - Right Eye  bevacizumab (AVASTIN) SOSY 2.5 mg    Intravitreal Injection, Pharmacologic Agent - OS - Left Eye    bevacizumab (AVASTIN) SOSY 2.5 mg    2. Exudative age-related macular degeneration of left eye with active choroidal neovascularization (HCC)  H35.3221 OCT, Retina - OU - Both Eyes      1.  Macular anatomy vastly improved OU from wet AMD posttreatment right eye 5 weeks left eye 6 weeks.  We will repeat again today and injection OU to maintain  2.  Acuity stabilize as well.  3.  Ophthalmic Meds Ordered this visit:  Meds ordered this encounter  Medications   bevacizumab (AVASTIN) SOSY 2.5 mg   bevacizumab (AVASTIN) SOSY 2.5 mg       Return in about 6 weeks (around 09/16/2021) for DILATE OU, AVASTIN OCT, OD, OS.  There are no Patient Instructions on file for this visit.   Explained the diagnoses, plan, and follow up with the patient and they expressed understanding.  Patient expressed understanding of the importance of proper follow up care.   Clent Demark Jashad Depaula M.D. Diseases & Surgery of the Retina and Vitreous [facility] [place] 08/05/21     Abbreviations: M myopia (nearsighted); A astigmatism; H hyperopia (farsighted); P presbyopia; Mrx spectacle prescription;  CTL contact lenses; OD right eye; OS left eye; OU both eyes  XT exotropia; ET esotropia; PEK punctate epithelial keratitis; PEE punctate epithelial erosions; DES dry eye syndrome; MGD meibomian gland dysfunction; ATs artificial tears; PFAT's preservative free artificial tears; [place] nuclear sclerotic cataract; PSC posterior subcapsular cataract; ERM epi-retinal membrane; PVD posterior vitreous detachment; RD retinal detachment; DM diabetes mellitus; DR diabetic retinopathy; NPDR non-proliferative diabetic retinopathy; PDR proliferative diabetic retinopathy; CSME clinically significant macular edema; DME diabetic macular edema; dbh dot blot hemorrhages; CWS cotton wool spot; POAG primary open  angle glaucoma; C/D cup-to-disc ratio; HVF humphrey visual field; GVF goldmann visual field; OCT optical coherence tomography; IOP intraocular pressure; BRVO Branch retinal vein occlusion; CRVO central retinal vein occlusion; CRAO central retinal artery occlusion; BRAO branch retinal artery occlusion; RT retinal tear; SB scleral buckle; PPV pars plana vitrectomy; VH Vitreous hemorrhage; PRP panretinal laser photocoagulation; IVK intravitreal kenalog; VMT vitreomacular traction; MH Macular hole;  NVD neovascularization of the disc; NVE neovascularization elsewhere; AREDS age related eye disease study; ARMD age related macular degeneration; POAG primary open angle glaucoma; EBMD epithelial/anterior basement membrane dystrophy; ACIOL anterior chamber intraocular lens; IOL intraocular lens; PCIOL posterior chamber intraocular lens; Phaco/IOL phacoemulsification with intraocular lens placement; [place] photorefractive keratectomy; LASIK laser assisted in situ keratomileusis; HTN hypertension; DM diabetes mellitus; COPD chronic obstructive pulmonary disease

## 2021-08-05 NOTE — Assessment & Plan Note (Signed)
Vastly improved macular findings left eye at 6 weeks repeat injection today reevaluate in 6 weeks

## 2021-08-06 ENCOUNTER — Encounter (INDEPENDENT_AMBULATORY_CARE_PROVIDER_SITE_OTHER): Payer: Medicare Other | Admitting: Ophthalmology

## 2021-08-20 ENCOUNTER — Ambulatory Visit (HOSPITAL_BASED_OUTPATIENT_CLINIC_OR_DEPARTMENT_OTHER)
Admission: RE | Admit: 2021-08-20 | Discharge: 2021-08-20 | Disposition: A | Discharge status: Home / Self Care | Payer: Medicare Other | Source: Ambulatory Visit | Attending: Cardiology | Admitting: Cardiology

## 2021-08-20 ENCOUNTER — Other Ambulatory Visit: Payer: Self-pay

## 2021-08-20 DIAGNOSIS — I272 Pulmonary hypertension, unspecified: Secondary | ICD-10-CM | POA: Diagnosis present

## 2021-08-20 LAB — ECHOCARDIOGRAM COMPLETE
AR max vel: 2.55 cm2
AV Area VTI: 2.55 cm2
AV Area mean vel: 2.42 cm2
AV Mean grad: 3 mmHg
AV Peak grad: 5.6 mmHg
AV Vena cont: 0.4 cm
Ao pk vel: 1.18 m/s
Area-P 1/2: 2.79 cm2
Calc EF: 47.2 %
MV M vel: 4.76 m/s
MV Peak grad: 90.6 mmHg
P 1/2 time: 853 msec
S' Lateral: 3 cm
Single Plane A2C EF: 49.6 %
Single Plane A4C EF: 48.6 %

## 2021-09-16 ENCOUNTER — Ambulatory Visit (INDEPENDENT_AMBULATORY_CARE_PROVIDER_SITE_OTHER): Payer: Medicare Other | Admitting: Ophthalmology

## 2021-09-16 ENCOUNTER — Other Ambulatory Visit: Payer: Self-pay

## 2021-09-16 ENCOUNTER — Encounter (INDEPENDENT_AMBULATORY_CARE_PROVIDER_SITE_OTHER): Payer: Medicare Other | Admitting: Ophthalmology

## 2021-09-16 ENCOUNTER — Encounter (INDEPENDENT_AMBULATORY_CARE_PROVIDER_SITE_OTHER): Payer: Self-pay | Admitting: Ophthalmology

## 2021-09-16 ENCOUNTER — Other Ambulatory Visit: Payer: Self-pay | Admitting: Adult Health

## 2021-09-16 DIAGNOSIS — H353211 Exudative age-related macular degeneration, right eye, with active choroidal neovascularization: Secondary | ICD-10-CM

## 2021-09-16 DIAGNOSIS — J431 Panlobular emphysema: Secondary | ICD-10-CM | POA: Diagnosis not present

## 2021-09-16 DIAGNOSIS — H353221 Exudative age-related macular degeneration, left eye, with active choroidal neovascularization: Secondary | ICD-10-CM | POA: Diagnosis not present

## 2021-09-16 MED ORDER — BEVACIZUMAB 2.5 MG/0.1ML IZ SOSY
2.5000 mg | PREFILLED_SYRINGE | INTRAVITREAL | Status: AC | PRN
  Administered 2021-09-16: 2.5 mg via INTRAVITREAL

## 2021-09-16 NOTE — Progress Notes (Signed)
09/16/2021     CHIEF COMPLAINT Patient presents for  Chief Complaint  Patient presents with   Retina Follow Up      HISTORY OF PRESENT ILLNESS: Frank Meyer is a 81 y.o. male who presents to the clinic today for:   HPI     Retina Follow Up           Diagnosis: Wet AMD   Laterality: both eyes   Onset: 6 weeks ago   Severity: mild   Duration: 6 weeks   Course: stable         Comments   6 week fu dilate OU and Avastin OU and OCT  Pt states VA OU stable since last visit. Pt denies FOL, floaters, or ocular pain OU.        Last edited by Kendra Opitz, COA on 09/16/2021 10:20 AM.      Referring physician: Burnard Bunting, MD [place],  [place] [address]  HISTORICAL INFORMATION:   Selected notes from the [facility]: No current outpatient medications on file. (Ophthalmic Drugs)   No current facility-administered medications for this visit. (Ophthalmic Drugs)   Current Outpatient Medications (Other)  Medication Sig   aspirin 81 MG EC tablet Take 1 tablet by mouth in the morning and at bedtime.   atorvastatin (LIPITOR) 20 MG tablet Take 20 mg by mouth daily.   cholecalciferol (VITAMIN D) 1000 UNITS tablet Take 1,000 Units by mouth daily.   donepezil (ARICEPT) 10 MG tablet TAKE 1 TABLET BY MOUTH  DAILY (Patient taking differently: Take 10 mg by mouth at bedtime.)   ENTRESTO 24-26 MG TAKE 1 TABLET BY MOUTH  TWICE DAILY (Patient taking differently: Take 1 tablet by mouth 2 (two) times daily.)   Fexofenadine-Pseudoephedrine (ALLEGRA-D 24 HOUR PO) Take 1 tablet by mouth daily.   finasteride (PROSCAR) 5 MG tablet Take 5 mg by mouth daily.   ipratropium (ATROVENT) 0.06 % nasal spray Place 2 sprays into both nostrils daily as needed for rhinitis.    Melatonin 10 MG TABS Take 10 mg by mouth at bedtime.   nitroGLYCERIN (NITROSTAT) 0.4 MG SL tablet Place 1 tablet (0.4 mg total) under the tongue every 5 (five)  minutes as needed. Chest pain   omeprazole (PRILOSEC) 40 MG capsule Take 40 mg by mouth daily.    OXYGEN Inhale 5 L into the lungs as directed. 5lt during the day and 2lt at night   thiamine 100 MG tablet Take 1 tablet (100 mg total) by mouth daily.   traZODone (DESYREL) 100 MG tablet TAKE 2 TABLETS BY MOUTH AT  BEDTIME (Patient taking differently: Take 100 mg by mouth at bedtime.)   No current facility-administered medications for this visit. (Other)      REVIEW OF SYSTEMS:    ALLERGIES No Known Allergies  PAST MEDICAL HISTORY Past Medical History:  Diagnosis Date   Allergic rhinitis 10/24/2009   Altered bowel habits 10/04/2020   Basal cell carcinoma    Benign prostatic hyperplasia with lower urinary tract symptoms 10/24/2009   Benign prostatic hypertrophy    Bradycardia 12/06/2019   Callosity 10/16/2013   Cardiomegaly 01/23/2015   Chronic diastolic heart failure ([place]) 12/19/2018   Chronic respiratory failure ([place]) 10/04/2020   Coronary artery calcification seen on CAT scan 05/28/2020   Dyslipidemia    Dyspnea on exertion 03/18/2018   Emphysema lung ([place]) 03/18/2018   Encounter for general adult medical examination without abnormal  findings 01/17/2015   Ex-cigarette smoker 01/20/2021   Exudative age-related macular degeneration of left eye with active choroidal neovascularization ([place]) 12/18/2019   Gallbladder problem    Gastroesophageal reflux disease    Hiatal hernia    Hilar adenopathy 03/25/2018   History of smoking 03/18/2018   Hyperlipidemia 10/24/2009   Insomnia 05/09/2013   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   Internal hemorrhoid 10/04/2020   Macular pucker, right eye 04/30/2020   Mediastinal adenopathy 03/25/2018   Memory loss 05/09/2013   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   Mild memory disturbance    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   Noninfective gastroenteritis and colitis, unspecified 10/04/2020   Nuclear sclerotic  cataract of right eye 12/18/2019   Other specified symptoms and signs involving the digestive system and abdomen 10/04/2020   Overweight 09/14/2018   Oxygen dependent 10/04/2020   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/2018   Personal history of colonic polyps 10/04/2020   Posterior vitreous detachment of right eye 12/18/2019   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   Pulmonary hypertension ([place])    Right upper lobe pulmonary nodule 03/25/2018   Syncope and collapse 12/12/2018   TIA (transient ischemic attack)    Transient ischemic attack 10/24/2009   Weight decreased 10/04/2020   Past Surgical History:  Procedure Laterality Date   none      FAMILY HISTORY Family History  Problem Relation Age of Onset   Obesity Mother    Memory loss Mother     SOCIAL HISTORY Social History   Tobacco Use   Smoking status: Former    Packs/day: 2.00    Years: 25.00    Pack years: 50.00    Types: Cigarettes    Quit date: 1994    Years since quitting: 29.1   Smokeless tobacco: Never  Substance Use Topics   Alcohol use: Yes    Alcohol/week: 15.0 - 20.0 standard drinks    Types: 15 - 20 Cans of beer per week   Drug use: Never         OPHTHALMIC EXAM:  Base Eye Exam     Visual Acuity (ETDRS)       Right Left   Dist [place] 20/20 20/25 -2         Tonometry (Tonopen, 10:23 AM)       Right Left   Pressure 15 12         Pupils       Pupils Dark Light Shape React APD   Right PERRL 3 3 Round Minimal None   Left PERRL 3 3 Round Minimal None         Visual Fields (Counting fingers)       Left Right    Full Full         Extraocular Movement       Right Left    Full, Ortho Full, Ortho         Neuro/Psych     Oriented x3: Yes   Mood/Affect: Normal         Dilation     Both eyes: 1.0% Mydriacyl, 2.5% Phenylephrine @ 10:23 AM           Slit Lamp and Fundus Exam     External Exam       Right Left   External Normal Normal         Slit Lamp  Exam       Right Left   Lids/Lashes Normal  Normal   Conjunctiva/Sclera White and quiet White and quiet   Cornea Clear Clear   Anterior Chamber Deep and quiet Deep and quiet   Iris Round and reactive Round and reactive   Lens 2+ Nuclear sclerosis Posterior chamber intraocular lens   Anterior Vitreous Normal Normal         Fundus Exam       Right Left   Posterior Vitreous Normal Posterior vitreous detachment   Disc Normal Normal   C/D Ratio 0.1 0.1   Macula Early age related macular degeneration, Hard drusen, no macular thickening Retinal pigment epithelial mottling, Early age related macular degeneration, no hemorrhage, Hard drusen, no exudates, Geographic atrophy, no macular thickening   Vessels Normal Normal   Periphery Normal Normal            IMAGING AND PROCEDURES  Imaging and Procedures for 09/16/21  OCT, Retina - OU - Both Eyes       Right Eye Quality was good. Scan locations included subfoveal. Central Foveal Thickness: 308. Progression has been stable. Findings include no IRF, epiretinal membrane, subretinal fluid.   Left Eye Quality was good. Scan locations included subfoveal. Central Foveal Thickness: 296. Progression has improved. Findings include retinal drusen , no IRF, subretinal hyper-reflective material, subretinal fluid.   Notes OD Minor epiretinal membrane, much improved at 6 weeks post recent  new wet AMD subretinal fluid temporal to FAZ as of November 2022, now much improved yet will need treatment OD today, at 6 weeks  OS, subretinal hyper reflective material in the region much less active active CNVM as compared to November 2022, fibrosis, improved post recent injection Avastin 6 weeks postinjection OS      Intravitreal Injection, Pharmacologic Agent - OD - Right Eye       Time Out 09/16/2021. 10:49 AM. Confirmed correct patient, procedure, site, and patient consented.   Anesthesia Topical anesthesia was used. Anesthetic medications  included Lidocaine 4%.   Procedure Preparation included 5% betadine to ocular surface, 10% betadine to eyelids. A 30 gauge needle was used.   Injection: 2.5 mg bevacizumab 2.5 MG/0.1ML   Route: Intravitreal, Site: Right Eye   NDC: [phone], Lot: [id number]   Post-op Post injection exam found visual acuity of at least counting fingers. The patient tolerated the procedure well. There were no complications. The patient received written and verbal post procedure care education. Post injection medications included ocuflox.      Intravitreal Injection, Pharmacologic Agent - OS - Left Eye       Time Out 09/16/2021. 10:50 AM. Confirmed correct patient, procedure, site, and patient consented.   Anesthesia Topical anesthesia was used. Anesthetic medications included Lidocaine 4%.   Procedure Preparation included 10% betadine to eyelids, 5% betadine to ocular surface, Tobramycin 0.3%. A 30 gauge needle was used.   Injection: 2.5 mg bevacizumab 2.5 MG/0.1ML   Route: Intravitreal, Site: Left Eye   NDC: [phone], Lot: [id number]   Post-op Post injection exam found visual acuity of at least counting fingers. The patient tolerated the procedure well. There were no complications. The patient received written and verbal post procedure care education. Post injection medications included ocuflox.              ASSESSMENT/PLAN:  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HCC) Vastly improved overall, stable acuity.  Repeat injection today at 6-week interval maintain 6-week interval OD  Exudative age-related macular degeneration of left eye with active choroidal neovascularization ([place]) Now also vastly improved  macular findings and preserved acuity currently at 6-week follow-up post Avastin less active disease.  Repeat injection today and examination next in 6 weeks  Panlobular emphysema ([place]) Patient on continuous O2 less likely has  some element of nocturnal hypoxia     ICD-10-CM   1. 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HCC)  H35.3211 OCT, Retina - OU - Both Eyes    Intravitreal Injection, Pharmacologic Agent - OD - Right Eye    bevacizumab (AVASTIN) SOSY 2.5 mg    2. Exudative age-related macular degeneration of left eye with active choroidal neovascularization (HCC)  H35.3221 OCT, Retina - OU - Both Eyes    Intravitreal Injection, Pharmacologic Agent - OS - Left Eye    bevacizumab (AVASTIN) SOSY 2.5 mg    3. Panlobular emphysema (HCC)  J43.1       1.  Bilateral wet AMD onset reactivation November 2022, vastly improved macular findings clinically as well as by OCT.  Post Avastin.  Repeat Avastin today at 6-week interval. 2.  Dilate OU next in 6 weeks and repeat injection likely  3.  Ophthalmic Meds Ordered this visit:  Meds ordered this encounter  Medications   bevacizumab (AVASTIN) SOSY 2.5 mg   bevacizumab (AVASTIN) SOSY 2.5 mg       Return in about 6 weeks (around 10/28/2021) for DILATE OU, AVASTIN OCT, OD, OS.  There are no Patient Instructions on file for this visit.   Explained the diagnoses, plan, and follow up with the patient and they expressed understanding.  Patient expressed understanding of the importance of proper follow up care.   Clent Demark Sherronda Sweigert M.D. Diseases & Surgery of the Retina and Vitreous Retina & Diabetic [place] 09/16/21     Abbreviations: M myopia (nearsighted); A astigmatism; H hyperopia (farsighted); P presbyopia; Mrx spectacle prescription;  CTL contact lenses; OD right eye; OS left eye; OU both eyes  XT exotropia; ET esotropia; PEK punctate epithelial keratitis; PEE punctate epithelial erosions; DES dry eye syndrome; MGD meibomian gland dysfunction; ATs artificial tears; PFAT's preservative free artificial tears; [place] nuclear sclerotic cataract; PSC posterior subcapsular cataract; ERM epi-retinal membrane;  PVD posterior vitreous detachment; RD retinal detachment; DM diabetes mellitus; DR diabetic retinopathy; NPDR non-proliferative diabetic retinopathy; PDR proliferative diabetic retinopathy; CSME clinically significant macular edema; DME diabetic macular edema; dbh dot blot hemorrhages; CWS cotton wool spot; POAG primary open angle glaucoma; C/D cup-to-disc ratio; HVF humphrey visual field; GVF goldmann visual field; OCT optical coherence tomography; IOP intraocular pressure; BRVO Branch retinal vein occlusion; CRVO central retinal vein occlusion; CRAO central retinal artery occlusion; BRAO branch retinal artery occlusion; RT retinal tear; SB scleral buckle; PPV pars plana vitrectomy; VH Vitreous hemorrhage; PRP panretinal laser photocoagulation; IVK intravitreal kenalog; VMT vitreomacular traction; MH Macular hole;  NVD neovascularization of the disc; NVE neovascularization elsewhere; AREDS age related eye disease study; ARMD age related macular degeneration; POAG primary open angle glaucoma; EBMD epithelial/anterior basement membrane dystrophy; ACIOL anterior chamber intraocular lens; IOL intraocular lens; PCIOL posterior chamber intraocular lens; Phaco/IOL phacoemulsification with intraocular lens placement; [place] photorefractive keratectomy; LASIK laser assisted in situ keratomileusis; HTN hypertension; DM diabetes mellitus; COPD chronic obstructive pulmonary disease

## 2021-09-16 NOTE — Assessment & Plan Note (Signed)
Now also vastly improved macular findings and preserved acuity currently at 6-week follow-up post Avastin less active disease.  Repeat injection today and examination next in 6 weeks

## 2021-09-16 NOTE — Assessment & Plan Note (Signed)
Patient on continuous O2 less likely has some element of nocturnal hypoxia

## 2021-09-16 NOTE — Assessment & Plan Note (Signed)
Vastly improved overall, stable acuity.  Repeat injection today at 6-week interval maintain 6-week interval OD

## 2021-09-17 NOTE — Telephone Encounter (Signed)
Rx refilled.

## 2021-10-28 ENCOUNTER — Encounter (INDEPENDENT_AMBULATORY_CARE_PROVIDER_SITE_OTHER): Payer: Self-pay | Admitting: Ophthalmology

## 2021-10-28 ENCOUNTER — Ambulatory Visit (INDEPENDENT_AMBULATORY_CARE_PROVIDER_SITE_OTHER): Payer: Medicare Other | Admitting: Ophthalmology

## 2021-10-28 ENCOUNTER — Other Ambulatory Visit: Payer: Self-pay

## 2021-10-28 DIAGNOSIS — Z9981 Dependence on supplemental oxygen: Secondary | ICD-10-CM

## 2021-10-28 DIAGNOSIS — H353211 Exudative age-related macular degeneration, right eye, with active choroidal neovascularization: Secondary | ICD-10-CM

## 2021-10-28 DIAGNOSIS — H35371 Puckering of macula, right eye: Secondary | ICD-10-CM | POA: Diagnosis not present

## 2021-10-28 DIAGNOSIS — H353221 Exudative age-related macular degeneration, left eye, with active choroidal neovascularization: Secondary | ICD-10-CM

## 2021-10-28 MED ORDER — BEVACIZUMAB 2.5 MG/0.1ML IZ SOSY
2.5000 mg | PREFILLED_SYRINGE | INTRAVITREAL | Status: AC | PRN
  Administered 2021-10-28: 2.5 mg via INTRAVITREAL

## 2021-10-28 NOTE — Assessment & Plan Note (Signed)
Due to chronic lung disease ?

## 2021-10-28 NOTE — Assessment & Plan Note (Signed)
Much less active less subretinal fluid as compared to recent recurrence some 4 months previous.  Repeat injection today at 6-week interval Avastin ?

## 2021-10-28 NOTE — Assessment & Plan Note (Signed)
Minor no impact on acuity 

## 2021-10-28 NOTE — Progress Notes (Signed)
? ? ?10/28/2021 ? ?  ? ?CHIEF COMPLAINT ?Patient presents for  ?Chief Complaint  ?Patient presents with  ? Macular Degeneration  ? ? ? ? ?HISTORY OF PRESENT ILLNESS: ?Frank Meyer is a 81 y.o. male who presents to the clinic today for:  ? ?HPI   ?6 weeks dilate OU, Avastin OCT, OD, OS. ?Patient states vision is stable and unchanged since last visit. Denies any new floaters or FOL. ? ?Last edited by Laurin Coder on 10/28/2021  9:47 AM.  ?  ? ? ?Referring physician: ?Burnard Bunting, MD ?[address] ?[place],  [place] [address] ? ?HISTORICAL INFORMATION:  ? ?Selected notes from the [facility] ?  ?   ? ?CURRENT MEDICATIONS: ?No current outpatient medications on file. (Ophthalmic Drugs)  ? ?No current facility-administered medications for this visit. (Ophthalmic Drugs)  ? ?Current Outpatient Medications (Other)  ?Medication Sig  ? aspirin 81 MG EC tablet Take 1 tablet by mouth in the morning and at bedtime.  ? atorvastatin (LIPITOR) 20 MG tablet Take 20 mg by mouth daily.  ? cholecalciferol (VITAMIN D) 1000 UNITS tablet Take 1,000 Units by mouth daily.  ? donepezil (ARICEPT) 10 MG tablet TAKE 1 TABLET BY MOUTH  DAILY (Patient taking differently: Take 10 mg by mouth at bedtime.)  ? ENTRESTO 24-26 MG TAKE 1 TABLET BY MOUTH  TWICE DAILY (Patient taking differently: Take 1 tablet by mouth 2 (two) times daily.)  ? Fexofenadine-Pseudoephedrine (ALLEGRA-D 24 HOUR PO) Take 1 tablet by mouth daily.  ? finasteride (PROSCAR) 5 MG tablet Take 5 mg by mouth daily.  ? ipratropium (ATROVENT) 0.06 % nasal spray Place 2 sprays into both nostrils daily as needed for rhinitis.   ? Melatonin 10 MG TABS Take 10 mg by mouth at bedtime.  ? nitroGLYCERIN (NITROSTAT) 0.4 MG SL tablet Place 1 tablet (0.4 mg total) under the tongue every 5 (five) minutes as needed. Chest pain  ? omeprazole (PRILOSEC) 40 MG capsule Take 40 mg by mouth daily.   ? OXYGEN Inhale 5 L into the lungs as directed. 5lt during the day and 2lt at night  ?  thiamine 100 MG tablet Take 1 tablet (100 mg total) by mouth daily.  ? traZODone (DESYREL) 100 MG tablet TAKE 2 TABLETS BY MOUTH AT  BEDTIME  ? ?No current facility-administered medications for this visit. (Other)  ? ? ? ? ?REVIEW OF SYSTEMS: ?ROS   ?Negative for: Constitutional, Gastrointestinal, Neurological, Skin, Genitourinary, Musculoskeletal, HENT, Endocrine, Cardiovascular, Eyes, Respiratory, Psychiatric, Allergic/Imm, Heme/Lymph ?Last edited by Hurman Horn, MD on 10/28/2021 10:12 AM.  ?  ? ? ? ?ALLERGIES ?No Known Allergies ? ?PAST MEDICAL HISTORY ?Past Medical History:  ?Diagnosis Date  ? Allergic rhinitis 10/24/2009  ? Altered bowel habits 10/04/2020  ? Basal cell carcinoma   ? Benign prostatic hyperplasia with lower urinary tract symptoms 10/24/2009  ? Benign prostatic hypertrophy   ? Bradycardia 12/06/2019  ? Callosity 10/16/2013  ? Cardiomegaly 01/23/2015  ? Chronic diastolic heart failure ([place]) 12/19/2018  ? Chronic respiratory failure ([place]) 10/04/2020  ? Coronary artery calcification seen on CAT scan 05/28/2020  ? Dyslipidemia   ? Dyspnea on exertion 03/18/2018  ? Emphysema lung ([place]) 03/18/2018  ? Encounter for general adult medical examination without abnormal findings 01/17/2015  ? Ex-cigarette smoker 01/20/2021  ? Exudative age-related macular degeneration of left eye with active choroidal neovascularization ([place]) 12/18/2019  ? Gallbladder problem   ? Gastroesophageal reflux disease   ? Hiatal hernia   ?  Hilar adenopathy 03/25/2018  ? History of smoking 03/18/2018  ? Hyperlipidemia 10/24/2009  ? Insomnia 05/09/2013  ? 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  ? Internal hemorrhoid 10/04/2020  ? Macular pucker, right eye 04/30/2020  ? Mediastinal adenopathy 03/25/2018  ? Memory loss 05/09/2013  ? 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 Mild memory disturbance   ? 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 Noninfective gastroenteritis and colitis, unspecified 10/04/2020  ? Nuclear  sclerotic cataract of right eye 12/18/2019  ? Other specified symptoms and signs involving the digestive system and abdomen 10/04/2020  ? Overweight 09/14/2018  ? Oxygen dependent 10/04/2020  ? 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 Personal history of colonic polyps 10/04/2020  ? Posterior vitreous detachment of right eye 12/18/2019  ? 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  ? Pulmonary hypertension ([place])   ? Right upper lobe pulmonary nodule 03/25/2018  ? Syncope and collapse 12/12/2018  ? TIA (transient ischemic attack)   ? Transient ischemic attack 10/24/2009  ? Weight decreased 10/04/2020  ? ?Past Surgical History:  ?Procedure Laterality Date  ? none    ? ? ?FAMILY HISTORY ?Family History  ?Problem Relation Age of Onset  ? Obesity Mother   ? Memory loss Mother   ? ? ?SOCIAL HISTORY ?Social History  ? ?Tobacco Use  ? Smoking status: Former  ?  Packs/day: 2.00  ?  Years: 25.00  ?  Pack years: 50.00  ?  Types: Cigarettes  ?  Quit date: 75  ?  Years since quitting: 29.2  ? Smokeless tobacco: Never  ?Substance Use Topics  ? Alcohol use: Yes  ?  Alcohol/week: 15.0 - 20.0 standard drinks  ?  Types: 15 - 20 Cans of beer per week  ? Drug use: Never  ? ?  ? ?  ? ?OPHTHALMIC EXAM: ? ?Base Eye Exam   ? ? Visual Acuity (ETDRS)   ? ?   Right Left  ? Dist [place] 20/25 -1 20/25  ? ?  ?  ? ? Tonometry (Tonopen, 9:50 AM)   ? ?   Right Left  ? Pressure 12 12  ? ?  ?  ? ? Pupils   ? ?   Pupils Dark Light React APD  ? Right PERRL 3 3 Minimal None  ? Left PERRL 3 3 Minimal None  ? ?  ?  ? ? Extraocular Movement   ? ?   Right Left  ?  Full Full  ? ?  ?  ? ? Neuro/Psych   ? ? Oriented x3: Yes  ? Mood/Affect: Normal  ? ?  ?  ? ? Dilation   ? ? Both eyes: 1.0% Mydriacyl, 2.5% Phenylephrine @ 9:50 AM  ? ?  ?  ? ?  ? ?Slit Lamp and Fundus Exam   ? ? External Exam   ? ?   Right Left  ? External Normal Normal  ? ?  ?  ? ? Slit Lamp Exam   ? ?   Right Left  ? Lids/Lashes Normal Normal  ? Conjunctiva/Sclera White and quiet White and  quiet  ? Cornea Clear Clear  ? Anterior Chamber Deep and quiet Deep and quiet  ? Iris Round and reactive Round and reactive  ? Lens 2+ Nuclear sclerosis Posterior chamber intraocular lens  ? Anterior Vitreous Normal Normal  ? ?  ?  ? ? Fundus Exam   ? ?   Right Left  ?  Posterior Vitreous Normal Posterior vitreous detachment  ? Disc Normal Normal  ? C/D Ratio 0.1 0.1  ? Macula Early age related macular degeneration, Hard drusen, no macular thickening Retinal pigment epithelial mottling, Early age related macular degeneration, no hemorrhage, Hard drusen, no exudates, Geographic atrophy, no macular thickening  ? Vessels Normal Normal  ? Periphery Normal Normal  ? ?  ?  ? ?  ? ? ?IMAGING AND PROCEDURES  ?Imaging and Procedures for 10/28/21 ? ?OCT, Retina - OU - Both Eyes   ? ?   ?Right Eye ?Quality was good. Scan locations included subfoveal. Central Foveal Thickness: 304. Progression has improved. Findings include no IRF, epiretinal membrane, subretinal fluid.  ? ?Left Eye ?Quality was good. Scan locations included subfoveal. Central Foveal Thickness: 295. Progression has improved. Findings include retinal drusen , no IRF, subretinal hyper-reflective material, subretinal fluid.  ? ?Notes ?OD Minor epiretinal membrane, much improved at 6 weeks post recent  new wet AMD subretinal fluid temporal to FAZ as of November 2022, now much improved yet will need treatment OD today, at 6 weeks ? ?OS, subretinal hyper reflective material in the region much less active active CNVM as compared to November 2022, fibrosis, improved post recent injection Avastin 6 weeks postinjection OS  ? ?  ? ?Intravitreal Injection, Pharmacologic Agent - OD - Right Eye   ? ?   ?Time Out ?10/28/2021. 10:16 AM. Confirmed correct patient, procedure, site, and patient consented.  ? ?Anesthesia ?Topical anesthesia was used. Anesthetic medications included Lidocaine 4%.  ? ?Procedure ?Preparation included 5% betadine to ocular surface, 10% betadine to  eyelids. A 30 gauge needle was used.  ? ?Injection: ?2.5 mg bevacizumab 2.5 MG/0.1ML ?  Route: Intravitreal, Site: Right Eye ?  [place]: [phone], Lot: [id number]  ? ?Post-op ?Post injection exam found visual a

## 2021-11-06 ENCOUNTER — Emergency Department (HOSPITAL_COMMUNITY): Payer: Medicare Other

## 2021-11-06 ENCOUNTER — Emergency Department (HOSPITAL_COMMUNITY)
Admission: EM | Admit: 2021-11-06 | Discharge: 2021-11-06 | Disposition: A | Discharge status: Home / Self Care | Payer: Medicare Other | Attending: Emergency Medicine | Admitting: Emergency Medicine

## 2021-11-06 ENCOUNTER — Encounter (HOSPITAL_COMMUNITY): Payer: Self-pay | Admitting: Emergency Medicine

## 2021-11-06 ENCOUNTER — Other Ambulatory Visit: Payer: Self-pay

## 2021-11-06 DIAGNOSIS — Z7982 Long term (current) use of aspirin: Secondary | ICD-10-CM | POA: Insufficient documentation

## 2021-11-06 DIAGNOSIS — R55 Syncope and collapse: Secondary | ICD-10-CM | POA: Diagnosis present

## 2021-11-06 DIAGNOSIS — R0602 Shortness of breath: Secondary | ICD-10-CM | POA: Insufficient documentation

## 2021-11-06 DIAGNOSIS — D72829 Elevated white blood cell count, unspecified: Secondary | ICD-10-CM | POA: Diagnosis not present

## 2021-11-06 DIAGNOSIS — I5032 Chronic diastolic (congestive) heart failure: Secondary | ICD-10-CM | POA: Diagnosis not present

## 2021-11-06 LAB — CBC WITH DIFFERENTIAL/PLATELET
Abs Immature Granulocytes: 0.06 10*3/uL (ref 0.00–0.07)
Basophils Absolute: 0.1 10*3/uL (ref 0.0–0.1)
Basophils Relative: 1 %
Eosinophils Absolute: 0.4 10*3/uL (ref 0.0–0.5)
Eosinophils Relative: 3 %
HCT: 39.9 % (ref 39.0–52.0)
Hemoglobin: 12.6 g/dL — ABNORMAL LOW (ref 13.0–17.0)
Immature Granulocytes: 1 %
Lymphocytes Relative: 19 %
Lymphs Abs: 2.1 10*3/uL (ref 0.7–4.0)
MCH: 27.9 pg (ref 26.0–34.0)
MCHC: 31.6 g/dL (ref 30.0–36.0)
MCV: 88.3 fL (ref 80.0–100.0)
Monocytes Absolute: 0.9 10*3/uL (ref 0.1–1.0)
Monocytes Relative: 8 %
Neutro Abs: 7.8 10*3/uL — ABNORMAL HIGH (ref 1.7–7.7)
Neutrophils Relative %: 68 %
Platelets: 237 10*3/uL (ref 150–400)
RBC: 4.52 MIL/uL (ref 4.22–5.81)
RDW: 14.5 % (ref 11.5–15.5)
WBC: 11.2 10*3/uL — ABNORMAL HIGH (ref 4.0–10.5)
nRBC: 0 % (ref 0.0–0.2)

## 2021-11-06 LAB — BASIC METABOLIC PANEL
Anion gap: 7 (ref 5–15)
BUN: 15 mg/dL (ref 8–23)
CO2: 25 mmol/L (ref 22–32)
Calcium: 8.6 mg/dL — ABNORMAL LOW (ref 8.9–10.3)
Chloride: 103 mmol/L (ref 98–111)
Creatinine, Ser: 1.33 mg/dL — ABNORMAL HIGH (ref 0.61–1.24)
GFR, Estimated: 54 mL/min — ABNORMAL LOW (ref >60)
Glucose, Bld: 108 mg/dL — ABNORMAL HIGH (ref 70–99)
Potassium: 6.3 mmol/L (ref 3.5–5.1)
Sodium: 135 mmol/L (ref 135–145)

## 2021-11-06 LAB — TROPONIN I (HIGH SENSITIVITY)
Troponin I (High Sensitivity): 6 ng/L (ref <18)
Troponin I (High Sensitivity): 8 ng/L (ref <18)

## 2021-11-06 LAB — POTASSIUM: Potassium: 4.9 mmol/L (ref 3.5–5.1)

## 2021-11-06 NOTE — ED Notes (Signed)
Pt ambulated and maintained oxygen sats. ?

## 2021-11-06 NOTE — ED Provider Notes (Signed)
?[facility] ?Provider Note ? ? ?CSN: [id number] ?Arrival date & time: 11/06/21  1147 ? ?  ? ?History ? ?Chief Complaint  ?Patient presents with  ? Loss of Consciousness  ? ? ?Frank Meyer is a 81 y.o. male.  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 ? ?States that today he was golfing when he had loss of consciousness.  He states that he usually does not play golf if he has to keep the cart on the pathway, but decided to today.  He states that he walked up to the ball hit and took off his oxygen.  He states that when he was walking back to his car he felt short of breath, had tightness in his chest and then had loss of consciousness.  He did strike his head.  Not anticoagulated.  He denies recent cough or fevers.  States that he is feeling back to baseline at this time. ? ? ?Loss of Consciousness ?Associated symptoms: shortness of breath   ?Associated symptoms: no chest pain, no diaphoresis, no dizziness, no fever, no headaches, no nausea, no palpitations, no seizures and no vomiting   ? ?  ? ?Home Medications ?Prior to Admission medications   ?Medication Sig Start Date End Date Taking? Authorizing Provider  ?aspirin 81 MG EC tablet Take 1 tablet by mouth in the morning and at bedtime.    [provider]  ?atorvastatin (LIPITOR) 20 MG tablet Take 20 mg by mouth daily.    [provider]  ?cholecalciferol (VITAMIN D) 1000 UNITS tablet Take 1,000 Units by mouth daily.    [provider]  ?donepezil (ARICEPT) 10 MG tablet TAKE 1 TABLET BY MOUTH  DAILY ?Patient taking differently: Take 10 mg by mouth at bedtime. 03/03/21   Kathrynn Ducking, MD  ?ENTRESTO 24-26 MG TAKE 1 TABLET BY MOUTH  TWICE DAILY ?Patient taking differently: Take 1 tablet by mouth 2 (two) times daily. 06/11/21   Park Liter, MD   ?Fexofenadine-Pseudoephedrine (ALLEGRA-D 24 HOUR PO) Take 1 tablet by mouth daily.    [provider]  ?finasteride (PROSCAR) 5 MG tablet Take 5 mg by mouth daily.    [provider]  ?ipratropium (ATROVENT) 0.06 % nasal spray Place 2 sprays into both nostrils daily as needed for rhinitis.  02/09/13   [provider]  ?Melatonin 10 MG TABS Take 10 mg by mouth at bedtime.    [provider]  ?nitroGLYCERIN (NITROSTAT) 0.4 MG SL tablet Place 1 tablet (0.4 mg total) under the tongue every 5 (five) minutes as needed. Chest pain 07/15/21 10/13/21  Park Liter, MD  ?omeprazole (PRILOSEC) 40 MG capsule Take 40 mg by mouth daily.  03/29/13   [provider]  ?OXYGEN Inhale 5 L into the lungs as directed. 5lt during the day and 2lt at night    [provider]  ?thiamine 100 MG tablet Take 1 tablet (100 mg total) by mouth daily. 12/13/18   Geradine Girt, DO  ?traZODone (DESYREL) 100 MG tablet TAKE 2 TABLETS BY MOUTH AT  BEDTIME 09/17/21   Suzzanne Cloud, NP  ?   ? ?Allergies    ?Patient has no known allergies.   ? ?Review of Systems   ?Review of Systems  ?Constitutional:  Negative for diaphoresis and fever.  ?Respiratory:  Positive for chest tightness and shortness of breath. Negative  for cough.   ?Cardiovascular:  Positive for syncope. Negative for chest pain, palpitations and leg swelling.  ?Gastrointestinal:  Negative for abdominal pain, nausea and vomiting.  ?Neurological:  Positive for syncope. Negative for dizziness, seizures, light-headedness and headaches.  ?All other systems reviewed and are negative. ? ?Physical Exam ?Updated Vital Signs ?BP 121/70   Pulse 73   Temp 98 ?F (36.7 ?C) (Oral)   Resp 20   Ht '5\' 6"'$  (1.676 m)   Wt 71.2 kg   SpO2 100%   BMI 25.34 kg/m?  ?Physical Exam ?Vitals and nursing note reviewed.  ?Constitutional:   ?   General: He is not in acute distress. ?   Appearance: Normal appearance. He is normal weight. He is not ill-appearing  or toxic-appearing.  ?HENT:  ?   Head: Normocephalic and atraumatic.  ?   Nose: Nose normal.  ?   Mouth/Throat:  ?   Mouth: Mucous membranes are moist.  ?   Pharynx: Oropharynx is clear.  ?Eyes:  ?   General: No scleral icterus. ?   Extraocular Movements: Extraocular movements intact.  ?   Pupils: Pupils are equal, round, and reactive to light.  ?Cardiovascular:  ?   Rate and Rhythm: Normal rate and regular rhythm.  ?   Pulses: Normal pulses.  ?   Heart sounds: Normal heart sounds. No murmur heard. ?Pulmonary:  ?   Effort: Pulmonary effort is normal. No respiratory distress.  ?   Breath sounds: Rales present.  ?Chest:  ?   Chest wall: No tenderness.  ?Abdominal:  ?   General: Bowel sounds are normal.  ?   Palpations: Abdomen is soft.  ?   Tenderness: There is no abdominal tenderness.  ?Musculoskeletal:     ?   General: Normal range of motion.  ?   Cervical back: Normal range of motion and neck supple. No tenderness.  ?Skin: ?   General: Skin is warm and dry.  ?   Capillary Refill: Capillary refill takes less than 2 seconds.  ?   Findings: No rash.  ?Neurological:  ?   General: No focal deficit present.  ?   Mental Status: He is alert and oriented to person, place, and time. Mental status is at baseline.  ?Psychiatric:     ?   Mood and Affect: Mood normal.     ?   Behavior: Behavior normal.     ?   Thought Content: Thought content normal.     ?   Judgment: Judgment normal.  ? ? ?ED Results / Procedures / Treatments   ?Labs ?(all labs ordered are listed, but only abnormal results are displayed) ?Labs Reviewed  ?BASIC METABOLIC PANEL - Abnormal; Notable for the following components:  ?    Result Value  ? Potassium 6.3 (*)   ? Glucose, Bld 108 (*)   ? Creatinine, Ser 1.33 (*)   ? Calcium 8.6 (*)   ? GFR, Estimated 54 (*)   ? All other components within normal limits  ?CBC WITH DIFFERENTIAL/PLATELET - Abnormal; Notable for the following components:  ? WBC 11.2 (*)   ? Hemoglobin 12.6 (*)   ? Neutro Abs 7.8 (*)   ? All  other components within normal limits  ?POTASSIUM  ?CBG MONITORING, ED  ?TROPONIN I (HIGH SENSITIVITY)  ?TROPONIN I (HIGH SENSITIVITY)  ? ?EKG ?EKG Interpretation ? ?Date/Time:  Thursday November 06 2021 12:03:20 EDT ?Ventricular Rate:  79 ?PR Interval:  177 ?QRS Duration: 83 ?QT Interval:  352 ?QTC Calculation: 404 ?R Axis:   95 ?Text Interpretation: Sinus rhythm Right axis deviation Borderline T abnormalities, anterior leads when compared to prior, faster rate. No STEMI Confirmed by Antony Blackbird [phone]) on 11/06/2021 12:09:11 PM ? ?Radiology ?No results found. ? ?Procedures ?Procedures  ? ?Medications Ordered in ED ?Medications - No data to display ? ?ED Course/ Medical Decision Making/ A&P ?  ?                        ?Medical Decision Making ?Amount and/or Complexity of Data Reviewed ?Labs: ordered. ?Radiology: ordered. ? ?This patient presents to the ED for concern of syncope, this involves an extensive number of treatment options, and is a complaint that carries with it a high risk of complications and morbidity.   ? ?Co morbidities that complicate the patient evaluation ?Emphysema, heart failure, TIA ? ?Additional history obtained:  ?Additional history obtained from: None ?External records from outside source obtained and reviewed including: Previous primary care visits ? ?EKG: ?EKG: normal EKG, normal sinus rhythm, nonspecific ST and T waves changes.  ? ?Cardiac Monitoring: ?The patient was maintained on a cardiac monitor.  I personally viewed and interpreted the cardiac monitored which showed an underlying rhythm of: Sinus rhythm ? ?Lab Results: ?I personally ordered, reviewed, and interpreted labs. ?Pertinent results include: ?CBC with leukocytosis to 11.2, likely reactive ?Initial BMP with potassium of 6.3, repeat 4.9.  Creatinine of 1.33, appears to be close to baseline ?Troponin 6, 8, delta +2 ? ?Imaging Studies ordered:  ?I ordered imaging studies which included x-ray and CT.  I independently reviewed &  interpreted imaging & am in agreement with radiology impression. ?Imaging shows: ?CT head and C-spine without abnormalities ?Chest x-ray with stable chronic findings ? ?Tests Considered: ?D-dimer ? ?ED Course: ?81 year old male who

## 2021-11-06 NOTE — Discharge Instructions (Signed)
You are seen in the emergency department today after passing out after taking off your oxygen.  All of your lab work, x-ray and EKG look okay.  We also scanned your head because you fell.  This was also normal.  Please continue to wear your oxygen continuously specially when you are exerting yourself.  Please follow-up with your primary care provider as needed.  Please return to emergency department if you have another episode of passing out. ?

## 2021-11-06 NOTE — ED Triage Notes (Signed)
Pt arrives via EMS for syncope. Pt was playing golf, took of his home O2 to swing. Normally wears 5L [place]. Pt was walking up a hill, felt winded, and collapsed. Pt was unconscious for 2 min. Pt's friend witnessed the event. Pt alert and oriented when EMS arrived. Denies CP. Pt has no complaints. Pt has hx of early dementia, but has remained al/ox4. ?BP 136/62, 98% on 5 L [place], HR 84 ?

## 2021-11-07 ENCOUNTER — Telehealth: Payer: Self-pay | Admitting: Cardiology

## 2021-11-07 NOTE — Telephone Encounter (Signed)
Pt c/o medication issue: ? ?1. Name of Medication: ENTRESTO 24-26 MG ? ?2. How are you currently taking this medication (dosage and times per day)? TAKE 1 TABLET BY MOUTH TWICE DAILYPatient taking differently: Take 1 tablet by mouth 2 (two) times daily. ? ?3. Are you having a reaction (difficulty breathing--STAT)? no ? ?4. What is your medication issue? Wants to reduce the dosage because feels that its lowering his bp. That the reason he is falling and passing out ? ?

## 2021-11-07 NOTE — Telephone Encounter (Signed)
Per Dr. Agustin Cree:  ?Frank Liter, MD  You 1 hour ago (1:23 PM)  ? ?This is the lowest possible dose of this medication but if he is willing to try just for maybe a week to cut down morning dose of Entresto and take only in the afternoon and let see if he feels any better please ask him to let us know how things go   ? ?You  Frank Liter, MD 4 hours ago (9:37 AM)  ? Spoke to both patient and his wife notified of the following. Will call back in a week.  ?

## 2021-11-14 NOTE — Telephone Encounter (Signed)
? ?  Pt is calling to f/u, he would like to provide update to the nurse ?

## 2021-11-29 ENCOUNTER — Other Ambulatory Visit: Payer: Self-pay | Admitting: Cardiology

## 2021-12-03 ENCOUNTER — Encounter: Payer: Self-pay | Admitting: Pulmonary Disease

## 2021-12-03 ENCOUNTER — Ambulatory Visit: Payer: Medicare Other | Admitting: Pulmonary Disease

## 2021-12-03 VITALS — BP 122/66 | HR 83 | Temp 98.6°F | Ht 66.0 in | Wt 155.0 lb

## 2021-12-03 DIAGNOSIS — J9611 Chronic respiratory failure with hypoxia: Secondary | ICD-10-CM

## 2021-12-03 DIAGNOSIS — R0609 Other forms of dyspnea: Secondary | ICD-10-CM

## 2021-12-03 MED ORDER — ANORO ELLIPTA 62.5-25 MCG/ACT IN AEPB: 1.0000 | INHALATION_SPRAY | Freq: Every day | RESPIRATORY_TRACT | 3 refills | Status: DC

## 2021-12-03 NOTE — Progress Notes (Signed)
? ?'@Patient'$  ID: Frank Meyer, male    DOB: 1941/03/13, 81 y.o.   MRN: [id number] ? ?Chief Complaint  ?Patient presents with  ? Follow-up  ?  Pt states he is feeling good today. Pt states he is still active and getting extercise. Pt states he is on 5L on the POC. Pt states 2 weeks ago he passed out of the golf course and went to the ER.   ? ? ?Referring provider: ?Burnard Bunting, MD ? ?HPI:  ? ?81 y.o. COPD/emphysema seen in follow up for evaluation of pulmonary hypertension, DOE, chronic hypoxemic respiratory failure. ? ?Breathing stable. Playing golf twice a week. Enjoying this. No worse but breathing no better. Stopped albuterol as never felt helpful.  Use Anoro for a couple weeks.  Not sure if it helped or not.  Did not use any further.  Would be interested in trying this again. ? ?Notably, he had syncope on the golf course 11/06/2021.  Currently thought to be related to [facility] dosing and hypotension.  This has been reduced. ? ?HPI at initial visit:  ?Patient longstanding history of shortness of breath.  He is need oxygen for some years.  Uses up to 5 L with activity, 3-4 at rest.  His shortness of breath is described as severe.  Worse with exertion.  Somewhat relieved by rest over time.  No positional changes.  Does not seem to change with seasons, environment.  No other alleviating or exacerbating factors. ? ?No history or family history I should say of pulmonary hypertension.  He denies any connective tissue disease.  No arthralgias, arthritis.  Denies Raynaud's phenomenon.  Never taken diet supplements.  Does not use methamphetamine.  No congenital heart disease per his report. ? ?Extensive review of medical record performed today.  Recent high-res CT chest with severe centrilobular and paraseptal emphysema on my review.  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  No evidence of ILD on my review.  TTE performed 11/2019.  This demonstrated mildly dilated  LA, diastolic dysfunction, mild aortic regurgitation, mildly dilated RA, mildly enlarged RV with normal RV function, estimated elevated PASP.  Most recent PFTs 06/2020 demonstrated severely reduced DLCO 44% predicted with a surprisingly PFTs, TLC, no evidence of hyperinflation. ? ?PMH: COPD, enlarged prostate, hyperlipidemia ?Surgical history: Reviewed in detail, he denies significant surgical history ?Family history: Memory loss in mother, denies significant pulmonary or respiratory issues and family ?Social history: Former smoker, 50-pack-year history, quit late 90s, retired ? ? ?Questionaires / Pulmonary Flowsheets:  ? ?ACT:  ?   ? View : No data to display.  ?  ?  ?  ? ? ?MMRC: ?mMRC Dyspnea Scale mMRC Score  ?09/26/2020 ?10:47 AM 0  ? ? ?[place]worth:  ?   ? View : No data to display.  ?  ?  ?  ? ? ?Tests:  ? ?FENO:  ?No results found for: NITRICOXIDE ? ?PFT: ? ?  Latest Ref Rng & Units 06/26/2020  ?  3:50 PM 04/27/2018  ?  8:48 AM  ?PFT Results  ?FVC-Pre L 3.75   4.11    ?FVC-Predicted Pre % 111   119    ?FVC-Post L 3.85   4.07    ?FVC-Predicted Post % 113   118    ?Pre FEV1/FVC % % 78   79    ?Post FEV1/FCV % % 78   78    ?FEV1-Pre L 2.92   3.23    ?FEV1-Predicted Pre %  122   131    ?FEV1-Post L 3.02   3.20    ?DLCO uncorrected ml/min/mmHg 9.44   11.81    ?DLCO UNC% % 44   44    ?DLCO corrected ml/min/mmHg 9.61   11.92    ?DLCO COR %Predicted % 44   44    ?DLVA Predicted % 45   49    ?TLC L 5.75   5.24    ?TLC % Predicted % 92   84    ?RV % Predicted % 87   51    ?Personally reviewed and interpreted as above ? ?WALK:  ?   ? View : No data to display.  ?  ?  ?  ? ? ?Imaging: ?Personally reviewed and as per EMR discussion of this note ? ?Lab Results: ?Personally reviewed, notably eosinophils as high as 400 06/2020 ?CBC ?   ?Component Value Date/Time  ? WBC 11.2 (H) 11/06/2021 1209  ? RBC 4.52 11/06/2021 1209  ? HGB 12.6 (L) 11/06/2021 1209  ? HCT 39.9 11/06/2021 1209  ? PLT 237 11/06/2021 1209  ? MCV 88.3 11/06/2021  1209  ? MCH 27.9 11/06/2021 1209  ? MCHC 31.6 11/06/2021 1209  ? RDW 14.5 11/06/2021 1209  ? LYMPHSABS 2.1 11/06/2021 1209  ? MONOABS 0.9 11/06/2021 1209  ? EOSABS 0.4 11/06/2021 1209  ? BASOSABS 0.1 11/06/2021 1209  ? ? ?BMET ?   ?Component Value Date/Time  ? NA 135 11/06/2021 1209  ? NA 139 10/28/2020 0930  ? K 4.9 11/06/2021 1331  ? CL 103 11/06/2021 1209  ? CO2 25 11/06/2021 1209  ? GLUCOSE 108 (H) 11/06/2021 1209  ? BUN 15 11/06/2021 1209  ? BUN 15 10/28/2020 0930  ? CREATININE 1.33 (H) 11/06/2021 1209  ? CALCIUM 8.6 (L) 11/06/2021 1209  ? GFRNONAA 54 (L) 11/06/2021 1209  ? GFRAA 64 06/13/2020 0819  ? ? ?BNP ?No results found for: BNP ? ?ProBNP ?   ?Component Value Date/Time  ? PROBNP 223 10/15/2020 1632  ? ? ?Specialty Problems   ? ?  ? Pulmonary Problems  ? Allergic rhinitis  ? Dyspnea on exertion  ? Panlobular emphysema ([place])  ? Right upper lobe pulmonary nodule  ? Hiatal hernia  ? Chronic respiratory failure ([place])  ? Oxygen dependent  ? ? ?No Known Allergies ? ?Immunization History  ?Administered Date(s) Administered  ? DTaP, 5 pertussis antigens 02/17/2016  ? Fluad Quad(high Dose 65+) 04/29/2019, 04/20/2020  ? Influenza Split 05/10/2012, 05/20/2013, 08/17/2013  ? Influenza, High Dose Seasonal PF 05/24/2017, 06/03/2018  ? Influenza-Unspecified 06/04/2018, 04/27/2019  ? PFIZER(Purple Top)SARS-COV-2 Vaccination 09/25/2019, 10/20/2019, 04/03/2020, 05/14/2020  ? Pneumococcal Conjugate-13 07/20/2014  ? ? ?Past Medical History:  ?Diagnosis Date  ? Allergic rhinitis 10/24/2009  ? Altered bowel habits 10/04/2020  ? Basal cell carcinoma   ? Benign prostatic hyperplasia with lower urinary tract symptoms 10/24/2009  ? Benign prostatic hypertrophy   ? Bradycardia 12/06/2019  ? Callosity 10/16/2013  ? Cardiomegaly 01/23/2015  ? Chronic diastolic heart failure ([place]) 12/19/2018  ? Chronic respiratory failure ([place]) 10/04/2020  ? Coronary artery calcification seen on CAT scan 05/28/2020  ? Dyslipidemia   ? Dyspnea on exertion 03/18/2018   ? Emphysema lung ([place]) 03/18/2018  ? Encounter for general adult medical examination without abnormal findings 01/17/2015  ? Ex-cigarette smoker 01/20/2021  ? Exudative age-related macular degeneration of left eye with active choroidal neovascularization ([place]) 12/18/2019  ? Gallbladder problem   ? Gastroesophageal reflux disease   ? Hiatal  hernia   ? Hilar adenopathy 03/25/2018  ? History of smoking 03/18/2018  ? Hyperlipidemia 10/24/2009  ? Insomnia 05/09/2013  ? 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  ? Internal hemorrhoid 10/04/2020  ? Macular pucker, right eye 04/30/2020  ? Mediastinal adenopathy 03/25/2018  ? Memory loss 05/09/2013  ? 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 Mild memory disturbance   ? 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 Noninfective gastroenteritis and colitis, unspecified 10/04/2020  ? Nuclear sclerotic cataract of right eye 12/18/2019  ? Other specified symptoms and signs involving the digestive system and abdomen 10/04/2020  ? Overweight 09/14/2018  ? Oxygen dependent 10/04/2020  ? Panlobular emphysema ([place]) 03/18/2018  ? Personal history of colonic polyps 10/04/2020  ? Posterior vitreous detachment of right eye 12/18/2019  ? 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  ? Pulmonary hypertension ([place])   ? Right upper lobe pulmonary nodule 03/25/2018  ? Syncope and collapse 12/12/2018  ? TIA (transient ischemic attack)   ? Transient ischemic attack 10/24/2009  ? Weight decreased 10/04/2020  ? ? ?Tobacco History: ?Social History  ? ?Tobacco Use  ?Smoking Status Former  ? Packs/day: 2.00  ? Years: 25.00  ? Pack years: 50.00  ? Types: Cigarettes  ? Quit date: 92  ? Years since quitting: 29.3  ?Smokeless Tobacco Never  ? ?Counseling given: Not Answered ? ? ?Continue to not smoke ? ?Outpatient Encounter Medications as of 12/03/2021  ?Medication Sig  ? albuterol (VENTOLIN HFA) 108 (90 Base) MCG/ACT inhaler Inhale 2 puffs into the lungs every 6 (six) hours  as needed for wheezing or shortness of breath.  ? aspirin 81 MG EC tablet Take 1 tablet by mouth in the morning and at bedtime.  ? atorvastatin (LIPITOR) 20 MG tablet Take 20 mg by mouth at bedtime.  ? c

## 2021-12-03 NOTE — Patient Instructions (Addendum)
Nice to see you again ? ?Lets try Anoro again, 1 puff once a day.  I sent a 43-monthsupply to [facility] with refills. ? ?Please contact me with any concerns if your breathing worsens or you pass out again etc. ? ?Return to clinic in 6 months or sooner as needed with Dr. HSilas Flood?

## 2021-12-09 ENCOUNTER — Ambulatory Visit (INDEPENDENT_AMBULATORY_CARE_PROVIDER_SITE_OTHER): Payer: Medicare Other | Admitting: Ophthalmology

## 2021-12-09 ENCOUNTER — Encounter (INDEPENDENT_AMBULATORY_CARE_PROVIDER_SITE_OTHER): Payer: Self-pay | Admitting: Ophthalmology

## 2021-12-09 DIAGNOSIS — H2511 Age-related nuclear cataract, right eye: Secondary | ICD-10-CM

## 2021-12-09 DIAGNOSIS — H353211 Exudative age-related macular degeneration, right eye, with active choroidal neovascularization: Secondary | ICD-10-CM | POA: Diagnosis not present

## 2021-12-09 DIAGNOSIS — H353221 Exudative age-related macular degeneration, left eye, with active choroidal neovascularization: Secondary | ICD-10-CM

## 2021-12-09 MED ORDER — BEVACIZUMAB 2.5 MG/0.1ML IZ SOSY
2.5000 mg | PREFILLED_SYRINGE | INTRAVITREAL | Status: AC | PRN
  Administered 2021-12-09: 2.5 mg via INTRAVITREAL

## 2021-12-09 NOTE — Assessment & Plan Note (Signed)

## 2021-12-09 NOTE — Assessment & Plan Note (Signed)
Improved macular findings and improved  acuity  OS ?

## 2021-12-09 NOTE — Assessment & Plan Note (Signed)
OD accounts for acuity.  May consider cataract surgery follow-up Dr. Wyatt Portela ?

## 2021-12-09 NOTE — Progress Notes (Signed)
? ? ?12/09/2021 ? ?  ? ?CHIEF COMPLAINT ?Patient presents for  ?Chief Complaint  ?Patient presents with  ? Macular Degeneration  ? ? ? ? ?HISTORY OF PRESENT ILLNESS: ?Frank Meyer is a 81 y.o. male who presents to the clinic today for:  ? ?HPI   ?6 weeks dilate OU, Avastin OCT OD, OS. ?Patient states "I feel like I have to use reading glasses a lot more." Denies any new floaters or FOL. ?Patient does not use any prescription eye drops. ? ?On chronic oxygen concentrator ?Last edited by Hurman Horn, MD on 12/09/2021 10:41 AM.  ?  ? ? ?Referring physician: ?Debbra Riding, MD ?[place] ?[address] ?[place],  [place] [address] ? ?HISTORICAL INFORMATION:  ? ?Selected notes from the [facility] ?  ?   ? ?CURRENT MEDICATIONS: ?No current outpatient medications on file. (Ophthalmic Drugs)  ? ?No current facility-administered medications for this visit. (Ophthalmic Drugs)  ? ?Current Outpatient Medications (Other)  ?Medication Sig  ? albuterol (VENTOLIN HFA) 108 (90 Base) MCG/ACT inhaler Inhale 2 puffs into the lungs every 6 (six) hours as needed for wheezing or shortness of breath.  ? aspirin 81 MG EC tablet Take 1 tablet by mouth in the morning and at bedtime.  ? atorvastatin (LIPITOR) 20 MG tablet Take 20 mg by mouth at bedtime.  ? cholecalciferol (VITAMIN D) 1000 UNITS tablet Take 1,000 Units by mouth daily.  ? donepezil (ARICEPT) 10 MG tablet TAKE 1 TABLET BY MOUTH  DAILY (Patient taking differently: Take 10 mg by mouth at bedtime.)  ? fexofenadine (ALLEGRA) 180 MG tablet Take 180 mg by mouth daily.  ? finasteride (PROSCAR) 5 MG tablet Take 5 mg by mouth daily.  ? ipratropium (ATROVENT) 0.06 % nasal spray Place 2 sprays into both nostrils daily as needed for rhinitis.   ? Melatonin 10 MG TABS Take 10 mg by mouth at bedtime.  ? nitroGLYCERIN (NITROSTAT) 0.4 MG SL tablet Place 1 tablet (0.4 mg total) under the tongue every 5 (five) minutes as needed. Chest pain  ? omeprazole (PRILOSEC) 40 MG capsule Take 40  mg by mouth daily.   ? OXYGEN Inhale 5 L into the lungs as directed. 5lt during the day and 2lt at night  ? sacubitril-valsartan (ENTRESTO) 24-26 MG Take 1 tablet by mouth 2 (two) times daily. (Patient taking differently: Take 1 tablet by mouth daily.)  ? thiamine 100 MG tablet Take 1 tablet (100 mg total) by mouth daily.  ? traZODone (DESYREL) 100 MG tablet TAKE 2 TABLETS BY MOUTH AT  BEDTIME  ? umeclidinium-vilanterol (ANORO ELLIPTA) 62.5-25 MCG/ACT AEPB Inhale 1 puff into the lungs daily.  ? zaleplon (SONATA) 10 MG capsule Take 10 mg by mouth at bedtime as needed for sleep.  ? ?No current facility-administered medications for this visit. (Other)  ? ? ? ? ?REVIEW OF SYSTEMS: ?ROS   ?Negative for: Constitutional, Gastrointestinal, Neurological, Skin, Genitourinary, Musculoskeletal, HENT, Endocrine, Cardiovascular, Eyes, Respiratory, Psychiatric, Allergic/Imm, Heme/Lymph ?Last edited by Hurman Horn, MD on 12/09/2021 10:41 AM.  ?  ? ? ? ?ALLERGIES ?No Known Allergies ? ?PAST MEDICAL HISTORY ?Past Medical History:  ?Diagnosis Date  ? Allergic rhinitis 10/24/2009  ? Altered bowel habits 10/04/2020  ? Basal cell carcinoma   ? Benign prostatic hyperplasia with lower urinary tract symptoms 10/24/2009  ? Benign prostatic hypertrophy   ? Bradycardia 12/06/2019  ? Callosity 10/16/2013  ? Cardiomegaly 01/23/2015  ? Chronic diastolic heart failure ([place]) 12/19/2018  ?  Chronic respiratory failure ([place]) 10/04/2020  ? Coronary artery calcification seen on CAT scan 05/28/2020  ? Dyslipidemia   ? Dyspnea on exertion 03/18/2018  ? Emphysema lung ([place]) 03/18/2018  ? Encounter for general adult medical examination without abnormal findings 01/17/2015  ? Ex-cigarette smoker 01/20/2021  ? Exudative age-related macular degeneration of left eye with active choroidal neovascularization ([place]) 12/18/2019  ? Gallbladder problem   ? Gastroesophageal reflux disease   ? Hiatal hernia   ? Hilar adenopathy 03/25/2018  ? History of smoking 03/18/2018  ? Hyperlipidemia  10/24/2009  ? Insomnia 05/09/2013  ? 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  ? Internal hemorrhoid 10/04/2020  ? Macular pucker, right eye 04/30/2020  ? Mediastinal adenopathy 03/25/2018  ? Memory loss 05/09/2013  ? 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 Mild memory disturbance   ? 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 Noninfective gastroenteritis and colitis, unspecified 10/04/2020  ? Nuclear sclerotic cataract of right eye 12/18/2019  ? Other specified symptoms and signs involving the digestive system and abdomen 10/04/2020  ? Overweight 09/14/2018  ? Oxygen dependent 10/04/2020  ? 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 Personal history of colonic polyps 10/04/2020  ? Posterior vitreous detachment of right eye 12/18/2019  ? 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  ? Pulmonary hypertension ([place])   ? Right upper lobe pulmonary nodule 03/25/2018  ? Syncope and collapse 12/12/2018  ? TIA (transient ischemic attack)   ? Transient ischemic attack 10/24/2009  ? Weight decreased 10/04/2020  ? ?Past Surgical History:  ?Procedure Laterality Date  ? none    ? ? ?FAMILY HISTORY ?Family History  ?Problem Relation Age of Onset  ? Obesity Mother   ? Memory loss Mother   ? ? ?SOCIAL HISTORY ?Social History  ? ?Tobacco Use  ? Smoking status: Former  ?  Packs/day: 2.00  ?  Years: 25.00  ?  Pack years: 50.00  ?  Types: Cigarettes  ?  Quit date: 40  ?  Years since quitting: 29.3  ? Smokeless tobacco: Never  ?Substance Use Topics  ? Alcohol use: Yes  ?  Alcohol/week: 15.0 - 20.0 standard drinks  ?  Types: 15 - 20 Cans of beer per week  ? Drug use: Never  ? ?  ? ?  ? ?OPHTHALMIC EXAM: ? ?Base Eye Exam   ? ? Visual Acuity (ETDRS)   ? ?   Right Left  ? Dist [place] 20/40 -1 20/20  ? Dist ph [place] NI   ? ?  ?  ? ? Tonometry (Tonopen, 9:56 AM)   ? ?   Right Left  ? Pressure 12 12  ? ?  ?  ? ? Pupils   ? ?   Pupils Dark Light Shape React APD  ? Right PERRL 3 3 Round Minimal None  ? Left  PERRL 3 3 Round Minimal None  ? ?  ?  ? ? Visual Fields   ? ?   Left Right  ?  Full Full  ? ?  ?  ? ? Extraocular Movement   ? ?   Right Left  ?  Full Full  ? ?  ?  ? ? Neuro/Psych   ? ? Oriented x3: Yes  ? Mood/Affect: Normal  ? ?  ?  ? ? Dilation   ? ? Both eyes: 1.0% Mydriacyl, 2.5% Phenylephrine @ 9:56 AM  ? ?  ?  ? ?  ? ?Slit Lamp and  Fundus Exam   ? ? External Exam   ? ?   Right Left  ? External Normal Normal  ? ?  ?  ? ? Slit Lamp Exam   ? ?   Right Left  ? Lids/Lashes Normal Normal  ? Conjunctiva/Sclera White and quiet White and quiet  ? Cornea Clear Clear  ? Anterior Chamber Deep and quiet Deep and quiet  ? Iris Round and reactive Round and reactive  ? Lens 2+ Nuclear sclerosis Posterior chamber intraocular lens  ? Anterior Vitreous Normal Normal  ? ?  ?  ? ? Fundus Exam   ? ?   Right Left  ? Posterior Vitreous Normal Posterior vitreous detachment  ? Disc Normal Normal  ? C/D Ratio 0.1 0.1  ? Macula Early age related macular degeneration, Hard drusen, no macular thickening Retinal pigment epithelial mottling, Early age related macular degeneration, no hemorrhage, Hard drusen, no exudates, Geographic atrophy, no macular thickening  ? Vessels Normal Normal  ? Periphery Normal Normal  ? ?  ?  ? ?  ? ? ?IMAGING AND PROCEDURES  ?Imaging and Procedures for 12/09/21 ? ?OCT, Retina - OU - Both Eyes   ? ?   ?Right Eye ?Quality was good. Scan locations included subfoveal. Central Foveal Thickness: 307. Progression has improved. Findings include no IRF, epiretinal membrane, subretinal fluid.  ? ?Left Eye ?Quality was good. Scan locations included subfoveal. Central Foveal Thickness: 295. Progression has improved. Findings include retinal drusen , no IRF, subretinal hyper-reflective material, subretinal fluid.  ? ?Notes ?OD Minor epiretinal membrane, much improved at 6 weeks post recent  new wet AMD subretinal fluid temporal to FAZ as of November 2022, now much improved yet will need treatment OD today, at 6  weeks ? ?OS, subretinal hyper reflective material in the region much less active active CNVM as compared to November 2022, fibrosis, improved post recent injection Avastin 6 weeks postinjection OS  ? ?  ? ?Intravitre

## 2021-12-10 ENCOUNTER — Other Ambulatory Visit: Payer: Self-pay | Admitting: Neurology

## 2022-01-07 ENCOUNTER — Ambulatory Visit: Payer: Medicare Other | Admitting: Cardiology

## 2022-01-07 ENCOUNTER — Encounter: Payer: Self-pay | Admitting: Cardiology

## 2022-01-07 ENCOUNTER — Ambulatory Visit (INDEPENDENT_AMBULATORY_CARE_PROVIDER_SITE_OTHER): Payer: Medicare Other

## 2022-01-07 VITALS — BP 114/72 | HR 86 | Ht 66.5 in | Wt 160.0 lb

## 2022-01-07 DIAGNOSIS — R55 Syncope and collapse: Secondary | ICD-10-CM

## 2022-01-07 DIAGNOSIS — I5032 Chronic diastolic (congestive) heart failure: Secondary | ICD-10-CM

## 2022-01-07 DIAGNOSIS — E785 Hyperlipidemia, unspecified: Secondary | ICD-10-CM | POA: Diagnosis not present

## 2022-01-07 DIAGNOSIS — R0609 Other forms of dyspnea: Secondary | ICD-10-CM

## 2022-01-07 DIAGNOSIS — R413 Other amnesia: Secondary | ICD-10-CM

## 2022-01-07 DIAGNOSIS — I272 Pulmonary hypertension, unspecified: Secondary | ICD-10-CM | POA: Diagnosis not present

## 2022-01-07 NOTE — Progress Notes (Signed)
Cardiology Office Note:    Date:  01/07/2022   ID:  Frank Meyer, DOB Feb 03, 1941, MRN [id number]  PCP:  Burnard Bunting, MD  Cardiologist:  Jenne Campus, MD    Referring MD: Burnard Bunting, MD   Chief Complaint  Patient presents with   Follow-up    History of Present Illness:    Frank Meyer is a 81 y.o. male    with past medical history significant for pulmonary hypertension which we think is most likely group 3 with some component of group 2.  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  He did have evaluation for coronary artery disease which showed some calcification with calcium score 400 however fractional flow reserve thereafter did not show any significant hemodynamically coronary artery disease.  He is being managed by pulmonary with oxygen for his COPD/emphysema and actually he is using oxygen all the time feels better but still some shortness of breath. Recently he ended up going to the emergency room after episode of syncope.  He said that he was playing golf he went to his cart and then he find his arm on the ground with some grass in his car.  He did not sustain injury he is not sure exactly how long he was unconscious however he was taken to the emergency room and work-up has been negative.  His wife also telling him that during the night when he sleeps sometimes he will see his to the point that he wet himself.  He denies have any palpitations no chest pain tightness squeezing pressure burning chest.  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   Encounter for general adult medical examination without abnormal findings 01/17/2015   Ex-cigarette smoker 01/20/2021   Exudative age-related macular degeneration of left eye with active choroidal neovascularization ([place]) 12/18/2019   Gallbladder problem    Gastroesophageal reflux disease    Hiatal hernia    Hilar adenopathy 03/25/2018   History of smoking 03/18/2018   Hyperlipidemia 10/24/2009   Insomnia 05/09/2013   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   Internal hemorrhoid 10/04/2020   Macular pucker, right eye 04/30/2020   Mediastinal adenopathy 03/25/2018   Memory loss 05/09/2013   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   Mild memory disturbance    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   Noninfective gastroenteritis and colitis, unspecified 10/04/2020   Nuclear sclerotic cataract of right eye 12/18/2019   Other specified symptoms and signs involving the digestive system and abdomen 10/04/2020   Overweight 09/14/2018   Oxygen dependent 10/04/2020   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   atorvastatin (LIPITOR) 20 MG tablet Take 20 mg by mouth at bedtime.   cholecalciferol (VITAMIN D) 1000 UNITS tablet Take 1,000 Units by mouth daily.   donepezil  (ARICEPT) 10 MG tablet TAKE 1 TABLET BY MOUTH  DAILY (Patient taking differently: Take 10 mg by mouth at bedtime.)   fexofenadine (ALLEGRA) 180 MG tablet Take 180 mg by mouth daily.   finasteride (PROSCAR) 5 MG tablet Take 5 mg by mouth daily.   ipratropium (ATROVENT) 0.06 % nasal spray Place 2 sprays into both nostrils daily as needed for rhinitis.    Melatonin 10 MG TABS Take 10 mg by mouth at bedtime.   nitroGLYCERIN (NITROSTAT) 0.4 MG SL tablet Place 1 tablet (0.4 mg total) under the tongue every 5 (five) minutes as needed. Chest pain (Patient taking differently: Place 0.4 mg under the tongue every 5 (five) minutes as needed for chest pain. Chest pain)   omeprazole (PRILOSEC) 40 MG capsule Take 40 mg by mouth daily.    OXYGEN Inhale 5 L into the lungs as directed. 5lt during the day and 2lt at night   sacubitril-valsartan (ENTRESTO) 24-26 MG Take 1 tablet by mouth 2 (two) times daily. (Patient taking differently: Take 1 tablet by mouth daily. At 3 pm)   thiamine 100 MG tablet Take 1 tablet (100 mg total) by mouth daily.   traZODone (DESYREL) 100 MG tablet TAKE 2 TABLETS BY MOUTH AT  BEDTIME (Patient taking differently: Take 200 mg by mouth at bedtime.)   umeclidinium-vilanterol (ANORO ELLIPTA) 62.5-25 MCG/ACT AEPB Inhale 1 puff into the lungs daily.   zaleplon (SONATA) 10 MG capsule Take 10 mg by mouth at bedtime as needed for sleep.   [DISCONTINUED] albuterol (VENTOLIN HFA) 108 (90 Base) MCG/ACT inhaler Inhale 2 puffs into the lungs every 6 (six) hours as needed for wheezing or shortness of breath.     Allergies:   Patient has no known allergies.   Social History   Socioeconomic History   Marital status: Married    Spouse name: pamela   Number of children: 2   Years of education: college   Highest education level: Not on file  Occupational History   Occupation: retired  Tobacco Use   Smoking status: Former    Packs/day: 2.00    Years: 25.00    Pack years: 50.00    Types:  Cigarettes    Quit date: 1994    Years since quitting: 29.4   Smokeless tobacco: Never  Substance and Sexual Activity   Alcohol use: Yes    Alcohol/week: 15.0 - 20.0 standard drinks    Types: 15 - 20 Cans of beer per week   Drug use: Never   Sexual activity: Not on file  Other Topics Concern   Not on file  Social History Narrative   Pt is retired. He is married. The pt lives with his wife. He has a Best boy. The pt has 2 children.      Patient drinks 2-3 cups of caffeine daily.   Patient is right handed.    Social Determinants of Health   Financial Resource Strain: Not on file  Food Insecurity: Not on file  Transportation Needs: Not on file  Physical Activity: Not on file  Stress: Not on  file  Social Connections: Not on file     Family History: The patient's family history includes Memory loss in his mother; Obesity in his mother. ROS:   Please see the history of present illness.    All 14 point review of systems negative except as described per history of present illness  EKGs/Labs/Other Studies Reviewed:      Recent Labs: 11/06/2021: BUN 15; Creatinine, Ser 1.33; Hemoglobin 12.6; Platelets 237; Potassium 4.9; Sodium 135  Recent Lipid Panel    Component Value Date/Time   CHOL 147 06/13/2020 0819   TRIG 129 06/13/2020 0819   HDL 57 06/13/2020 0819   CHOLHDL 2.6 06/13/2020 0819   LDLCALC 67 06/13/2020 0819    Physical Exam:    VS:  BP 114/72 (BP Location: Right Arm, Patient Position: Sitting)   Pulse 86   Ht 5' 6.5" (1.689 m)   Wt 160 lb (72.6 kg)   SpO2 96%   BMI 25.44 kg/m     Wt Readings from Last 3 Encounters:  01/07/22 160 lb (72.6 kg)  12/03/21 155 lb (70.3 kg)  11/06/21 157 lb (71.2 kg)     GEN:  Well nourished, well developed in no acute distress HEENT: Normal NECK: No JVD; No carotid bruits LYMPHATICS: No lymphadenopathy CARDIAC: RRR, no murmurs, no rubs, no gallops RESPIRATORY:  Clear to auscultation without rales, wheezing or  rhonchi  ABDOMEN: Soft, non-tender, non-distended MUSCULOSKELETAL:  No edema; No deformity  SKIN: Warm and dry LOWER EXTREMITIES: no swelling NEUROLOGIC:  Alert and oriented x 3 PSYCHIATRIC:  Normal affect   ASSESSMENT:    1. Pulmonary hypertension (HCC)   2. Dyspnea on exertion   3. Dyslipidemia   4. Syncope and collapse   5. Memory loss   6. Chronic diastolic heart failure (HCC)    PLAN:    In order of problems listed above:  Episode of syncope that brought him to the emergency room of course concerns about potential arrhythmia.  Look like there was no postictal state.  He tells me that his wife describes episode of seizures during the night which also could be related to significant bradycardia.  There is no postictal state at this he does not describe anything like that however sometimes sick episode of syncope is profoundly may have that it will need to incontinent of urine.  I think we must rule out significant arrhythmia and I will ask him to have Zio patch for 2 weeks.  Also he does have appoint with neurology which I think is an excellent idea. Pulmonary hypertension/COPD is managed with oxygen by pulmonary.  Stable. Dyslipidemia I did review his K PN which show me his LDL 81 HDL 62 we will continue present management. Chronic diastolic systolic congestive heart failure.  He appears to be compensated on physical exam.  His Delene Loll has been reduced thinking that maybe episode syncope was related to [facility] however I doubt in for now I will not change any of his medication until have better understanding what the problem is. I did review record from the emergency room for this visit   Medication Adjustments/Labs and Tests Ordered: Current medicines are reviewed at length with the patient today.  Concerns regarding medicines are outlined above.  No orders of the defined types were placed in this encounter.  Medication changes: No orders of the defined types were placed in this  encounter.   Signed, Park Liter, MD, [facility] 01/07/2022 3:57 PM    [place]

## 2022-01-07 NOTE — Patient Instructions (Signed)
Medication Instructions:  Your physician recommends that you continue on your current medications as directed. Please refer to the Current Medication list given to you today.  *If you need a refill on your cardiac medications before your next appointment, please call your pharmacy*   Lab Work: None Ordered If you have labs (blood work) drawn today and your tests are completely normal, you will receive your results only by: MyChart Message (if you have MyChart) OR A paper copy in the mail If you have any lab test that is abnormal or we need to change your treatment, we will call you to review the results.   Testing/Procedures:  WHY IS MY DOCTOR PRESCRIBING ZIO? The Zio system is proven and trusted by physicians to detect and diagnose irregular heart rhythms -- and has been prescribed to hundreds of thousands of patients.  The FDA has cleared the Zio system to monitor for many different kinds of irregular heart rhythms. In a study, physicians were able to reach a diagnosis 90% of the time with the Zio system1.  You can wear the Zio monitor -- a small, discreet, comfortable patch -- during your normal day-to-day activity, including while you sleep, shower, and exercise, while it records every single heartbeat for analysis.  1Barrett, P., et al. Comparison of 24 Hour Holter Monitoring Versus 14 Day Novel Adhesive Patch Electrocardiographic Monitoring. American Journal of Medicine, 2014.  ZIO VS. HOLTER MONITORING The Zio monitor can be comfortably worn for up to 14 days. Holter monitors can be worn for 24 to 48 hours, limiting the time to record any irregular heart rhythms you may have. Zio is able to capture data for the 51% of patients who have their first symptom-triggered arrhythmia after 48 hours.1  LIVE WITHOUT RESTRICTIONS The Zio ambulatory cardiac monitor is a small, unobtrusive, and water-resistant patch--you might even forget you're wearing it. The Zio monitor records and stores  every beat of your heart, whether you're sleeping, working out, or showering.     Follow-Up: At [facility], you and your health needs are our priority.  As part of our continuing mission to provide you with exceptional heart care, we have created designated Provider Care Teams.  These Care Teams include your primary Cardiologist (physician) and Advanced Practice Providers (APPs -  Physician Assistants and Nurse Practitioners) who all work together to provide you with the care you need, when you need it.  We recommend signing up for the patient portal called "MyChart".  Sign up information is provided on this After Visit Summary.  MyChart is used to connect with patients for Virtual Visits (Telemedicine).  Patients are able to view lab/test results, encounter notes, upcoming appointments, etc.  Non-urgent messages can be sent to your provider as well.   To learn more about what you can do with MyChart, go to [email or web address].    Your next appointment:   5 month(s)  The format for your next appointment:   In Person  Provider:   Robert Krasowski, MD    Other Instructions NA  

## 2022-01-08 ENCOUNTER — Encounter: Payer: Self-pay | Admitting: Neurology

## 2022-01-08 ENCOUNTER — Other Ambulatory Visit: Payer: Self-pay | Admitting: Neurology

## 2022-01-08 ENCOUNTER — Ambulatory Visit: Payer: Medicare Other | Admitting: Neurology

## 2022-01-08 VITALS — BP 110/63 | HR 71 | Ht 66.0 in | Wt 160.0 lb

## 2022-01-08 DIAGNOSIS — F09 Unspecified mental disorder due to known physiological condition: Secondary | ICD-10-CM

## 2022-01-08 DIAGNOSIS — G40909 Epilepsy, unspecified, not intractable, without status epilepticus: Secondary | ICD-10-CM

## 2022-01-08 MED ORDER — LEVETIRACETAM 500 MG PO TABS: 500.0000 mg | ORAL_TABLET | Freq: Two times a day (BID) | ORAL | 4 refills | Status: DC

## 2022-01-08 MED ORDER — LEVETIRACETAM 500 MG PO TABS: 500.0000 mg | ORAL_TABLET | Freq: Two times a day (BID) | ORAL | 0 refills | Status: DC

## 2022-01-08 NOTE — Progress Notes (Signed)
[facility]  PATIENT: Frank Meyer DOB: 03-Jul-1941  REQUESTING CLINICIAN: Toy Baker Da* HISTORY FROM: Patient and spouse  REASON FOR VISIT: Seizure like activity    HISTORICAL  CHIEF COMPLAINT:  Chief Complaint  Patient presents with   New Patient (Initial Visit)    Rm 12, with wife NX [name]/[facility]/Brittany [employer]anch [employer] NP [phone] of extremities/ Wife states pt has jerking spells while sleeping, reports 3 spells within 2 years, last episode was 2 weeks ago and it was the most severe       HISTORY OF PRESENT ILLNESS:  This is a 81 year old gentleman with past medical history of pulmonary hypertension, requiring 24/7 supplemental oxygen, memory decline, who is presenting with wife with 3 episodes of seizure-like activity during sleep for the past 2 years.  Wife reports patient has been having activities described as shaking of the upper extremities in the middle of night during sleep, initially she thought he was dreaming but noted that it was very difficult to wake him up during the events.  Afterwards she reports that patient will feel tired and complains of jaw soreness.  Wife reports last episode was 2 weeks ago and it was most severe as patient had shaking of the upper extremities, ?generalized convulsion, with difficulty to wake him up and associated with urinary incontinence.  After the shaking stopped, he was unresponsive for another 10 minutes.  Afterward he was able to take a shower and go to sleep in the guest room.  The next day patient attempted to play golf but he has to come sooner because he was extremely tired.  He denies any previous history of seizures, previously has was followed by Dr. Jannifer Franklin for cognitive impairment/dementia.  He did not present to the emergency department after each episode but presented to the ED on 3/23 after a syncope while playing golf. Work up at that time was  unrevealing  OTHER MEDICAL CONDITIONS: Pulmonary hypertension requiring supplemental O2,  Memory problem, Heart failure, TIA    REVIEW OF SYSTEMS: Full 14 system review of systems performed and negative with exception of: as noted in the HPI   ALLERGIES: No Known Allergies  HOME MEDICATIONS: Outpatient Medications Prior to Visit  Medication Sig Dispense Refill   aspirin 81 MG EC tablet Take 1 tablet by mouth in the morning and at bedtime.     atorvastatin (LIPITOR) 20 MG tablet Take 20 mg by mouth at bedtime.     cholecalciferol (VITAMIN D) 1000 UNITS tablet Take 1,000 Units by mouth daily.     donepezil (ARICEPT) 10 MG tablet TAKE 1 TABLET BY MOUTH  DAILY (Patient taking differently: Take 10 mg by mouth at bedtime.) 90 tablet 3   fexofenadine (ALLEGRA) 180 MG tablet Take 180 mg by mouth daily.     finasteride (PROSCAR) 5 MG tablet Take 5 mg by mouth daily.     ipratropium (ATROVENT) 0.06 % nasal spray Place 2 sprays into both nostrils daily as needed for rhinitis.      Melatonin 10 MG TABS Take 10 mg by mouth at bedtime.     omeprazole (PRILOSEC) 40 MG capsule Take 40 mg by mouth daily.      OXYGEN Inhale 5 L into the lungs as directed. 5lt during the day and 2lt at night     sacubitril-valsartan (ENTRESTO) 24-26 MG Take 1 tablet by mouth 2 (two) times daily. (Patient taking differently: Take 1 tablet by mouth daily. At 3 pm) 180 tablet 1  thiamine 100 MG tablet Take 1 tablet (100 mg total) by mouth daily. 30 tablet 0   traZODone (DESYREL) 100 MG tablet TAKE 2 TABLETS BY MOUTH AT  BEDTIME (Patient taking differently: Take 200 mg by mouth at bedtime.) 180 tablet 0   umeclidinium-vilanterol (ANORO ELLIPTA) 62.5-25 MCG/ACT AEPB Inhale 1 puff into the lungs daily. 180 each 3   zaleplon (SONATA) 10 MG capsule Take 10 mg by mouth at bedtime as needed for sleep.     nitroGLYCERIN (NITROSTAT) 0.4 MG SL tablet Place 1 tablet (0.4 mg total) under the tongue every 5 (five) minutes as needed. Chest  pain (Patient taking differently: Place 0.4 mg under the tongue every 5 (five) minutes as needed for chest pain. Chest pain) 25 tablet 6   No facility-administered medications prior to visit.    PAST MEDICAL HISTORY: Past Medical History:  Diagnosis Date   Allergic rhinitis 10/24/2009   Altered bowel habits 10/04/2020   Basal cell carcinoma    Benign prostatic hyperplasia with lower urinary tract symptoms 10/24/2009   Benign prostatic hypertrophy    Bradycardia 12/06/2019   Callosity 10/16/2013   Cardiomegaly 01/23/2015   Chronic diastolic heart failure ([place]) 12/19/2018   Chronic respiratory failure ([place]) 10/04/2020   Coronary artery calcification seen on CAT scan 05/28/2020   Dyslipidemia    Dyspnea on exertion 03/18/2018   Emphysema lung ([place]) 03/18/2018   Encounter for general adult medical examination without abnormal findings 01/17/2015   Ex-cigarette smoker 01/20/2021   Exudative age-related macular degeneration of left eye with active choroidal neovascularization ([place]) 12/18/2019   Gallbladder problem    Gastroesophageal reflux disease    Hiatal hernia    Hilar adenopathy 03/25/2018   History of smoking 03/18/2018   Hyperlipidemia 10/24/2009   Insomnia 05/09/2013   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   Internal hemorrhoid 10/04/2020   Macular pucker, right eye 04/30/2020   Mediastinal adenopathy 03/25/2018   Memory loss 05/09/2013   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   Mild memory disturbance    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   Noninfective gastroenteritis and colitis, unspecified 10/04/2020   Nuclear sclerotic cataract of right eye 12/18/2019   Other specified symptoms and signs involving the digestive system and abdomen 10/04/2020   Overweight 09/14/2018   Oxygen dependent 10/04/2020   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   Personal history of colonic polyps 10/04/2020   Posterior vitreous detachment of right eye 12/18/2019    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   Pulmonary hypertension ([place])    Right upper lobe pulmonary nodule 03/25/2018   Syncope and collapse 12/12/2018   TIA (transient ischemic attack)    Transient ischemic attack 10/24/2009   Weight decreased 10/04/2020    PAST SURGICAL HISTORY: Past Surgical History:  Procedure Laterality Date   none      FAMILY HISTORY: Family History  Problem Relation Age of Onset   Obesity Mother    Memory loss Mother     SOCIAL HISTORY: Social History   Socioeconomic History   Marital status: Married    Spouse name: [place]   Number of children: 2   Years of education: college   Highest education level: Not on file  Occupational History   Occupation: retired  Tobacco Use   Smoking status: Former    Packs/day: 2.00    Years: 25.00    Pack years: 50.00    Types: Cigarettes    Quit date: 1994    Years  since quitting: 29.4   Smokeless tobacco: Never  Substance and Sexual Activity   Alcohol use: Yes    Alcohol/week: 15.0 - 20.0 standard drinks    Types: 15 - 20 Cans of beer per week   Drug use: Never   Sexual activity: Not on file  Other Topics Concern   Not on file  Social History Narrative   Pt is retired. He is married. The pt lives with his wife. He has a Best boy. The pt has 2 children.      Patient drinks 2-3 cups of caffeine daily.   Patient is right handed.    Social Determinants of Health   Financial Resource Strain: Not on file  Food Insecurity: Not on file  Transportation Needs: Not on file  Physical Activity: Not on file  Stress: Not on file  Social Connections: Not on file  Intimate Partner Violence: Not on file    PHYSICAL EXAM  GENERAL EXAM/CONSTITUTIONAL: Vitals:  Vitals:   01/08/22 1012  BP: 110/63  Pulse: 71  Weight: 160 lb (72.6 kg)  Height: '5\' 6"'$  (1.676 m)   Body mass index is 25.82 kg/m. Wt Readings from Last 3 Encounters:  01/08/22 160 lb (72.6 kg)  01/07/22 160 lb (72.6  kg)  12/03/21 155 lb (70.3 kg)   Patient is in no distress; well developed, nourished and groomed; neck is supple  EYES: Pupils round and reactive to light, Visual fields full to confrontation, Extraocular movements intacts,   MUSCULOSKELETAL: Gait, strength, tone, movements noted in Neurologic exam below  NEUROLOGIC: MENTAL STATUS:     05/29/2021    9:57 AM 05/29/2020   10:21 AM 05/30/2019   10:21 AM  MMSE - Mini Mental State Exam  Orientation to time '5 5 4  '$ Orientation to Place '5 5 5  '$ Registration '3 3 3  '$ Attention/ Calculation '5 5 5  '$ Recall '2 2 2  '$ Language- name 2 objects '2 2 2  '$ Language- repeat '1 1 1  '$ Language- follow 3 step command '3 3 3  '$ Language- read & follow direction '1 1 1  '$ Write a sentence '1 1 1  '$ Copy design '1 1 1  '$ Total score '29 29 28   '$ awake, alert, oriented to person, place and time recent and remote memory intact normal attention and concentration language fluent, comprehension intact, naming intact fund of knowledge appropriate  CRANIAL NERVE:  2nd, 3rd, 4th, 6th - pupils equal and reactive to light, visual fields full to confrontation, extraocular muscles intact, no nystagmus 5th - facial sensation symmetric 7th - facial strength symmetric 8th - hearing intact 9th - palate elevates symmetrically, uvula midline 11th - shoulder shrug symmetric 12th - tongue protrusion midline  MOTOR:  normal bulk and tone, full strength in the BUE, BLE  SENSORY:  normal and symmetric to light touch, pinprick, temperature, vibration  COORDINATION:  finger-nose-finger, fine finger movements normal  REFLEXES:  deep tendon reflexes present and symmetric  GAIT/STATION:  normal   DIAGNOSTIC DATA (LABS, IMAGING, TESTING) - I reviewed patient records, labs, notes, testing and imaging myself where available.  Lab Results  Component Value Date   WBC 11.2 (H) 11/06/2021   HGB 12.6 (L) 11/06/2021   HCT 39.9 11/06/2021   MCV 88.3 11/06/2021   PLT 237 11/06/2021       Component Value Date/Time   NA 135 11/06/2021 1209   NA 139 10/28/2020 0930   K 4.9 11/06/2021 1331   CL 103 11/06/2021 1209   CO2 25  11/06/2021 1209   GLUCOSE 108 (H) 11/06/2021 1209   BUN 15 11/06/2021 1209   BUN 15 10/28/2020 0930   CREATININE 1.33 (H) 11/06/2021 1209   CALCIUM 8.6 (L) 11/06/2021 1209   PROT 6.8 06/13/2020 0819   ALBUMIN 3.7 06/13/2020 0819   AST 25 06/13/2020 0819   ALT 20 06/13/2020 0819   ALKPHOS 71 06/13/2020 0819   BILITOT 0.9 06/13/2020 0819   GFRNONAA 54 (L) 11/06/2021 1209   GFRAA 64 06/13/2020 0819   Lab Results  Component Value Date   CHOL 147 06/13/2020   HDL 57 06/13/2020   LDLCALC 67 06/13/2020   TRIG 129 06/13/2020   CHOLHDL 2.6 06/13/2020   No results found for: HGBA1C No results found for: VITAMINB12 Lab Results  Component Value Date   TSH 1.890 12/12/2018    Head CT/Cervical spine 11/06/21 1. No evidence of acute intracranial abnormality. 2. No evidence of acute fracture or traumatic malalignment in the cervical spine. 3. Small sclerotic bone lesions in the C6 and C7 vertebral bodies, probably benign bone islands in the absence of a known primary malignancy.    ASSESSMENT AND PLAN  81 y.o. year old male with multiple medical conditions including pulmonary hypertension requiring supplemental O2,  Memory problem, Heart failure, TIA who is presenting with a total of 3 episodes concerning for seizures.  These episodes happened during sleep with patient having difficulty waking up, some confusion and unresponsiveness followed by next day tiredness and muscle soreness.  I informed patient and wife that I am concerned that these events are seizures and I would like to start him on levetiracetam 500 mg twice daily.  I will also like to obtain a routine EEG.  We will call the Patient to go over the result.  Discussed Keppra side effect, advised wife to contact me if patient develop any of the side effects and also discussed driving  restriction for the next 6 months.  Patient was comfortable with plan.  I will see him in 3 months for follow-up.   1. Seizure disorder ([place])   2. Mild cognitive disorder      Patient Instructions  Start with levetiracetam 500 mg twice daily Continue other medication Contact me if he has a breakthrough seizure Follow-up in 57-month  Orders Placed This Encounter  Procedures   EEG adult    Meds ordered this encounter  Medications   levETIRAcetam (KEPPRA) 500 MG tablet    Sig: Take 1 tablet (500 mg total) by mouth 2 (two) times daily.    Dispense:  180 tablet    Refill:  4   levETIRAcetam (KEPPRA) 500 MG tablet    Sig: Take 1 tablet (500 mg total) by mouth 2 (two) times daily.    Dispense:  60 tablet    Refill:  0    Return in about 3 months (around 04/10/2022).  I have spent a total of 45 minutes dedicated to this patient today, preparing to see patient, performing a medically appropriate examination and evaluation, ordering tests and/or medications and procedures, and counseling and educating the patient/family/caregiver; independently interpreting result and communicating results to the family/patient/caregiver; and documenting clinical information in the electronic medical record.   AAlric Ran MD 01/08/2022, 12:47 PM  [facility] [address] [place] [phone]

## 2022-01-08 NOTE — Patient Instructions (Signed)
Start with levetiracetam 500 mg twice daily Continue other medication Contact me if he has a breakthrough seizure Follow-up in 74-month

## 2022-01-14 ENCOUNTER — Ambulatory Visit: Payer: Medicare Other | Admitting: Neurology

## 2022-01-14 DIAGNOSIS — G40909 Epilepsy, unspecified, not intractable, without status epilepticus: Secondary | ICD-10-CM | POA: Diagnosis not present

## 2022-01-19 NOTE — Procedures (Signed)
    History:  81 year old man with seizure disorder.   EEG classification:  Awake and asleep  Description of the recording: The background rhythms of this recording consists of a fairly well modulated medium amplitude background activity of 9-10 Hz. As the record progresses, the patient initially is in the waking state, but appears to enter the early stage II sleep during the recording, with rudimentary sleep spindles and vertex sharp wave activity seen. During the wakeful state, photic stimulation is performed, and no abnormal responses were seen. Hyperventilation was also performed, no abnormal response seen. No epileptiform discharges seen during this recording. There was intermittent right temporal focal slowing. EKG monitor shows no evidence of cardiac rhythm abnormalities with a heart rate of 55.  Abnormality: Intermittent right temporal slowing  Impression: This is an abnormal EEG recording in the waking and sleeping state due to right temporal slowing which is consistent with an area of neuronal dysfunction within the right temporal region.    Alric Ran, MD [facility]

## 2022-01-21 ENCOUNTER — Ambulatory Visit (INDEPENDENT_AMBULATORY_CARE_PROVIDER_SITE_OTHER): Payer: Medicare Other | Admitting: Ophthalmology

## 2022-01-21 ENCOUNTER — Encounter (INDEPENDENT_AMBULATORY_CARE_PROVIDER_SITE_OTHER): Payer: Self-pay | Admitting: Ophthalmology

## 2022-01-21 ENCOUNTER — Other Ambulatory Visit: Payer: Self-pay | Admitting: Neurology

## 2022-01-21 DIAGNOSIS — H353221 Exudative age-related macular degeneration, left eye, with active choroidal neovascularization: Secondary | ICD-10-CM

## 2022-01-21 DIAGNOSIS — G40909 Epilepsy, unspecified, not intractable, without status epilepticus: Secondary | ICD-10-CM

## 2022-01-21 DIAGNOSIS — H353211 Exudative age-related macular degeneration, right eye, with active choroidal neovascularization: Secondary | ICD-10-CM

## 2022-01-21 MED ORDER — BEVACIZUMAB 2.5 MG/0.1ML IZ SOSY
2.5000 mg | PREFILLED_SYRINGE | INTRAVITREAL | Status: AC | PRN
  Administered 2022-01-21: 2.5 mg via INTRAVITREAL

## 2022-01-21 NOTE — Assessment & Plan Note (Signed)
OS vastly improved over the last 7 months post subfoveal CNVM developing.  Currently at 6-week interval maintain improvement to maintain acuity

## 2022-01-21 NOTE — Assessment & Plan Note (Signed)
OS vastly improved post injection Avastin some 6 weeks previous and vastly improved overall as compared to onset of disease November 2022

## 2022-01-21 NOTE — Progress Notes (Signed)
01/21/2022     CHIEF COMPLAINT Patient presents for  Chief Complaint  Patient presents with   Macular Degeneration      HISTORY OF PRESENT ILLNESS: Frank Meyer is a 81 y.o. male who presents to the clinic today for:   HPI   6 weeks for DILATE OU, AVASTIN OCT. Pt stated no changes in vision. Pt denies new floaters and FOL.   Last edited by Silvestre Moment on 01/21/2022  9:58 AM.      Referring physician: Burnard Bunting, MD [address] [place],  [place] [address]  HISTORICAL INFORMATION:   Selected notes from the [facility]: No current outpatient medications on file. (Ophthalmic Drugs)   No current facility-administered medications for this visit. (Ophthalmic Drugs)   Current Outpatient Medications (Other)  Medication Sig   aspirin 81 MG EC tablet Take 1 tablet by mouth in the morning and at bedtime.   atorvastatin (LIPITOR) 20 MG tablet Take 20 mg by mouth at bedtime.   cholecalciferol (VITAMIN D) 1000 UNITS tablet Take 1,000 Units by mouth daily.   donepezil (ARICEPT) 10 MG tablet TAKE 1 TABLET BY MOUTH  DAILY (Patient taking differently: Take 10 mg by mouth at bedtime.)   fexofenadine (ALLEGRA) 180 MG tablet Take 180 mg by mouth daily.   finasteride (PROSCAR) 5 MG tablet Take 5 mg by mouth daily.   ipratropium (ATROVENT) 0.06 % nasal spray Place 2 sprays into both nostrils daily as needed for rhinitis.    levETIRAcetam (KEPPRA) 500 MG tablet Take 1 tablet (500 mg total) by mouth 2 (two) times daily.   levETIRAcetam (KEPPRA) 500 MG tablet TAKE 1 TABLET(500 MG) BY MOUTH TWICE DAILY   Melatonin 10 MG TABS Take 10 mg by mouth at bedtime.   nitroGLYCERIN (NITROSTAT) 0.4 MG SL tablet Place 1 tablet (0.4 mg total) under the tongue every 5 (five) minutes as needed. Chest pain (Patient taking differently: Place 0.4 mg under the tongue every 5 (five) minutes as needed for chest pain. Chest pain)   omeprazole (PRILOSEC) 40 MG capsule  Take 40 mg by mouth daily.    OXYGEN Inhale 5 L into the lungs as directed. 5lt during the day and 2lt at night   sacubitril-valsartan (ENTRESTO) 24-26 MG Take 1 tablet by mouth 2 (two) times daily. (Patient taking differently: Take 1 tablet by mouth daily. At 3 pm)   thiamine 100 MG tablet Take 1 tablet (100 mg total) by mouth daily.   traZODone (DESYREL) 100 MG tablet TAKE 2 TABLETS BY MOUTH AT  BEDTIME (Patient taking differently: Take 200 mg by mouth at bedtime.)   umeclidinium-vilanterol (ANORO ELLIPTA) 62.5-25 MCG/ACT AEPB Inhale 1 puff into the lungs daily.   zaleplon (SONATA) 10 MG capsule Take 10 mg by mouth at bedtime as needed for sleep.   No current facility-administered medications for this visit. (Other)      REVIEW OF SYSTEMS: ROS   Negative for: Constitutional, Gastrointestinal, Neurological, Skin, Genitourinary, Musculoskeletal, HENT, Endocrine, Cardiovascular, Eyes, Respiratory, Psychiatric, Allergic/Imm, Heme/Lymph Last edited by Silvestre Moment on 01/21/2022  9:58 AM.       ALLERGIES No Known Allergies  PAST MEDICAL HISTORY Past Medical History:  Diagnosis Date   Allergic rhinitis 10/24/2009   Altered bowel habits 10/04/2020   Basal cell carcinoma    Benign prostatic hyperplasia with lower urinary tract symptoms 10/24/2009   Benign prostatic hypertrophy    Bradycardia 12/06/2019   Callosity 10/16/2013  Cardiomegaly 01/23/2015   Chronic diastolic heart failure ([place]) 12/19/2018   Chronic respiratory failure ([place]) 10/04/2020   Coronary artery calcification seen on CAT scan 05/28/2020   Dyslipidemia    Dyspnea on exertion 03/18/2018   Emphysema lung ([place]) 03/18/2018   Encounter for general adult medical examination without abnormal findings 01/17/2015   Ex-cigarette smoker 01/20/2021   Exudative age-related macular degeneration of left eye with active choroidal neovascularization ([place]) 12/18/2019   Gallbladder problem    Gastroesophageal reflux disease    Hiatal hernia    Hilar  adenopathy 03/25/2018   History of smoking 03/18/2018   Hyperlipidemia 10/24/2009   Insomnia 05/09/2013   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   Internal hemorrhoid 10/04/2020   Macular pucker, right eye 04/30/2020   Mediastinal adenopathy 03/25/2018   Memory loss 05/09/2013   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   Mild memory disturbance    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)    Right upper lobe pulmonary nodule 03/25/2018   Syncope and collapse 12/12/2018   TIA (transient ischemic attack)    Transient ischemic attack 10/24/2009   Weight decreased 10/04/2020   Past Surgical History:  Procedure Laterality Date   none      FAMILY HISTORY Family History  Problem Relation Age of Onset   Obesity Mother    Memory loss Mother     SOCIAL HISTORY Social History   Tobacco Use   Smoking status: Former    Packs/day: 2.00    Years: 25.00    Pack years: 50.00    Types: Cigarettes    Quit date: 1994    Years since quitting: 29.4   Smokeless tobacco: Never  Substance Use Topics   Alcohol use: Yes    Alcohol/week: 15.0 - 20.0 standard drinks    Types: 15 - 20 Cans of beer per week   Drug use: Never         OPHTHALMIC EXAM:  Base Eye Exam     Visual Acuity (ETDRS)       Right Left   Dist [place] 20/25 -1 20/20 -2         Tonometry (Tonopen, 10:03 AM)       Right Left   Pressure 11 12         Pupils       Pupils APD   Right PERRL  None   Left PERRL None         Visual Fields       Left Right    Full Full         Extraocular Movement       Right Left    Full Full         Neuro/Psych     Oriented x3: Yes   Mood/Affect: Normal         Dilation     Both eyes: 1.0% Mydriacyl, 2.5% Phenylephrine @ 10:03 AM           Slit Lamp and Fundus Exam  External Exam       Right Left   External Normal Normal         Slit Lamp Exam       Right Left   Lids/Lashes Normal Normal   Conjunctiva/Sclera White and quiet White and quiet   Cornea Clear Clear   Anterior Chamber Deep and quiet Deep and quiet   Iris Round and reactive Round and reactive   Lens 2+ Nuclear sclerosis Posterior chamber intraocular lens   Anterior Vitreous Normal Normal         Fundus Exam       Right Left   Posterior Vitreous Normal Posterior vitreous detachment   Disc Normal Normal   C/D Ratio 0.1 0.1   Macula Early age related macular degeneration, Hard drusen, no macular thickening Retinal pigment epithelial mottling, Early age related macular degeneration, no hemorrhage, Hard drusen, no exudates, Geographic atrophy, no macular thickening   Vessels Normal Normal   Periphery Normal Normal            IMAGING AND PROCEDURES  Imaging and Procedures for 01/21/22  OCT, Retina - OU - Both Eyes       Right Eye Quality was good. Scan locations included subfoveal. Central Foveal Thickness: 295. Progression has improved. Findings include no IRF, epiretinal membrane, subretinal fluid.   Left Eye Quality was good. Scan locations included subfoveal. Central Foveal Thickness: 295. Progression has improved. Findings include retinal drusen , no IRF, subretinal hyper-reflective material, subretinal fluid.   Notes OD Minor epiretinal membrane, much improved at 6 weeks post recent  new wet AMD subretinal fluid temporal to FAZ as of November 2022, now much improved yet will need treatment OD today, at 6 weeks  OS,  subretinal hyper reflective material in the region much less active active CNVM as compared to November 2022, fibrosis, improved post recent injection Avastin 6 weeks postinjection OS      Intravitreal Injection, Pharmacologic Agent - OS - Left Eye       Time Out 01/21/2022. 10:57 AM. Confirmed correct patient, procedure, site, and patient consented.   Anesthesia Topical anesthesia was used. Anesthetic medications included Lidocaine 4%.   Procedure Preparation included 10% betadine to eyelids, 5% betadine to ocular surface, Tobramycin 0.3%. A 30 gauge needle was used.   Injection: 2.5 mg bevacizumab 2.5 MG/0.1ML   Route: Intravitreal, Site: Left Eye   NDC: [phone], Lot: [id number], Expiration date: 03/15/2022   Post-op Post injection exam found visual acuity of at least counting fingers. The patient tolerated the procedure well. There were no complications. The patient received written and verbal post procedure care education. Post injection medications included ocuflox.              ASSESSMENT/PLAN:  Exudative age-related macular degeneration of left eye with active choroidal neovascularization (HCC) OS vastly improved over the last 7 months post subfoveal CNVM developing.  Currently at 6-week interval maintain improvement to maintain acuity  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HCC) OS vastly improved post injection Avastin some 6 weeks previous and vastly improved overall as compared to onset of disease November 2022     ICD-10-CM   1. Exudative age-related macular degeneration of left eye with active choroidal neovascularization (HCC)  H35.3221 OCT, Retina - OU - Both Eyes    Intravitreal Injection, Pharmacologic Agent - OS - Left Eye    bevacizumab (AVASTIN) SOSY 2.5 mg    2. Exudative age-related macular degeneration of right eye with  active choroidal neovascularization ([place])  H35.3211 OCT, Retina - OU -  Both Eyes    Intravitreal Injection, Pharmacologic Agent - OD - Right Eye      1.  OU vastly improved macular anatomy and preserved acuity post onset of therapy bilaterally for wet AMD.  Disease recurrence November 2022 now vastly improved on therapy every 6 week evaluation with Avastin.  2.  Repeat OU injection Avastin today and follow-up next in 7 weeks  3.  Dilate OU next likely Avastin OU next  Chronic lung disease and continuous oxygen likely as a contributory to the hypoxic disease driving the macular [facility]  Ophthalmic Meds Ordered this visit:  Meds ordered this encounter  Medications   bevacizumab (AVASTIN) SOSY 2.5 mg       Return in about 7 weeks (around 03/11/2022) for DILATE OU, AVASTIN OCT, OD, OS.  There are no Patient Instructions on file for this visit.   Explained the diagnoses, plan, and follow up with the patient and they expressed understanding.  Patient expressed understanding of the importance of proper follow up care.   Clent Demark Sunny Aguon M.D. Diseases & Surgery of the Retina and Vitreous Retina & Diabetic [place] 01/21/22     Abbreviations: M myopia (nearsighted); A astigmatism; H hyperopia (farsighted); P presbyopia; Mrx spectacle prescription;  CTL contact lenses; OD right eye; OS left eye; OU both eyes  XT exotropia; ET esotropia; PEK punctate epithelial keratitis; PEE punctate epithelial erosions; DES dry eye syndrome; MGD meibomian gland dysfunction; ATs artificial tears; PFAT's preservative free artificial tears; [place] nuclear sclerotic cataract; PSC posterior subcapsular cataract; ERM epi-retinal membrane; PVD posterior vitreous detachment; RD retinal detachment; DM diabetes mellitus; DR diabetic retinopathy; NPDR non-proliferative diabetic retinopathy; PDR proliferative diabetic retinopathy; CSME clinically significant macular edema; DME diabetic macular edema; dbh dot blot hemorrhages; CWS cotton wool spot; POAG primary open angle glaucoma; C/D  cup-to-disc ratio; HVF humphrey visual field; GVF goldmann visual field; OCT optical coherence tomography; IOP intraocular pressure; BRVO Branch retinal vein occlusion; CRVO central retinal vein occlusion; CRAO central retinal artery occlusion; BRAO branch retinal artery occlusion; RT retinal tear; SB scleral buckle; PPV pars plana vitrectomy; VH Vitreous hemorrhage; PRP panretinal laser photocoagulation; IVK intravitreal kenalog; VMT vitreomacular traction; MH Macular hole;  NVD neovascularization of the disc; NVE neovascularization elsewhere; AREDS age related eye disease study; ARMD age related macular degeneration; POAG primary open angle glaucoma; EBMD epithelial/anterior basement membrane dystrophy; ACIOL anterior chamber intraocular lens; IOL intraocular lens; PCIOL posterior chamber intraocular lens; Phaco/IOL phacoemulsification with intraocular lens placement; [place] photorefractive keratectomy; LASIK laser assisted in situ keratomileusis; HTN hypertension; DM diabetes mellitus; COPD chronic obstructive pulmonary disease

## 2022-01-26 ENCOUNTER — Encounter (INDEPENDENT_AMBULATORY_CARE_PROVIDER_SITE_OTHER): Payer: Self-pay | Admitting: Ophthalmology

## 2022-01-26 ENCOUNTER — Ambulatory Visit (INDEPENDENT_AMBULATORY_CARE_PROVIDER_SITE_OTHER): Payer: Medicare Other | Admitting: Ophthalmology

## 2022-01-26 DIAGNOSIS — H353221 Exudative age-related macular degeneration, left eye, with active choroidal neovascularization: Secondary | ICD-10-CM | POA: Diagnosis not present

## 2022-01-26 DIAGNOSIS — H1132 Conjunctival hemorrhage, left eye: Secondary | ICD-10-CM | POA: Diagnosis not present

## 2022-01-26 NOTE — Assessment & Plan Note (Addendum)
Minor 5 days postinjection but not pathologic  Observe OS

## 2022-01-26 NOTE — Progress Notes (Signed)
01/26/2022     CHIEF COMPLAINT Patient presents for  Chief Complaint  Patient presents with   Retina Follow Up      HISTORY OF PRESENT ILLNESS: Frank Meyer is a 81 y.o. male who presents to the clinic today for:   HPI     Retina Follow Up           Diagnosis: Other   Laterality: left eye   Severity: moderate   Course: stable         Comments   WIP- redness OS lower portion. Pt last visit was 01/21/2022 Pt denies floaters and FOL. Pt denies pain, itchiness and tearing.  Pt stated vision has been stable since last visit. Pt wife main concern is redness in the eye.       Last edited by Silvestre Moment on 01/26/2022  3:31 PM.      Referring physician: Burnard Bunting, MD [address] [place],  [place] [address]  HISTORICAL INFORMATION:   Selected notes from the [facility]: No current outpatient medications on file. (Ophthalmic Drugs)   No current facility-administered medications for this visit. (Ophthalmic Drugs)   Current Outpatient Medications (Other)  Medication Sig   aspirin 81 MG EC tablet Take 1 tablet by mouth in the morning and at bedtime.   atorvastatin (LIPITOR) 20 MG tablet Take 20 mg by mouth at bedtime.   cholecalciferol (VITAMIN D) 1000 UNITS tablet Take 1,000 Units by mouth daily.   donepezil (ARICEPT) 10 MG tablet TAKE 1 TABLET BY MOUTH  DAILY (Patient taking differently: Take 10 mg by mouth at bedtime.)   fexofenadine (ALLEGRA) 180 MG tablet Take 180 mg by mouth daily.   finasteride (PROSCAR) 5 MG tablet Take 5 mg by mouth daily.   ipratropium (ATROVENT) 0.06 % nasal spray Place 2 sprays into both nostrils daily as needed for rhinitis.    levETIRAcetam (KEPPRA) 500 MG tablet Take 1 tablet (500 mg total) by mouth 2 (two) times daily.   levETIRAcetam (KEPPRA) 500 MG tablet TAKE 1 TABLET(500 MG) BY MOUTH TWICE DAILY   Melatonin 10 MG TABS Take 10 mg by mouth at bedtime.   nitroGLYCERIN (NITROSTAT) 0.4  MG SL tablet Place 1 tablet (0.4 mg total) under the tongue every 5 (five) minutes as needed. Chest pain (Patient taking differently: Place 0.4 mg under the tongue every 5 (five) minutes as needed for chest pain. Chest pain)   omeprazole (PRILOSEC) 40 MG capsule Take 40 mg by mouth daily.    OXYGEN Inhale 5 L into the lungs as directed. 5lt during the day and 2lt at night   sacubitril-valsartan (ENTRESTO) 24-26 MG Take 1 tablet by mouth 2 (two) times daily. (Patient taking differently: Take 1 tablet by mouth daily. At 3 pm)   thiamine 100 MG tablet Take 1 tablet (100 mg total) by mouth daily.   traZODone (DESYREL) 100 MG tablet TAKE 2 TABLETS BY MOUTH AT  BEDTIME (Patient taking differently: Take 200 mg by mouth at bedtime.)   umeclidinium-vilanterol (ANORO ELLIPTA) 62.5-25 MCG/ACT AEPB Inhale 1 puff into the lungs daily.   zaleplon (SONATA) 10 MG capsule Take 10 mg by mouth at bedtime as needed for sleep.   No current facility-administered medications for this visit. (Other)      REVIEW OF SYSTEMS: ROS   Negative for: Constitutional, Gastrointestinal, Neurological, Skin, Genitourinary, Musculoskeletal, HENT, Endocrine, Cardiovascular, Eyes, Respiratory, Psychiatric, Allergic/Imm, Heme/Lymph Last edited by Silvestre Moment on  01/26/2022  3:31 PM.       ALLERGIES No Known Allergies  PAST MEDICAL HISTORY Past Medical History:  Diagnosis Date   Allergic rhinitis 10/24/2009   Altered bowel habits 10/04/2020   Basal cell carcinoma    Benign prostatic hyperplasia with lower urinary tract symptoms 10/24/2009   Benign prostatic hypertrophy    Bradycardia 12/06/2019   Callosity 10/16/2013   Cardiomegaly 01/23/2015   Chronic diastolic heart failure ([place]) 12/19/2018   Chronic respiratory failure ([place]) 10/04/2020   Coronary artery calcification seen on CAT scan 05/28/2020   Dyslipidemia    Dyspnea on exertion 03/18/2018   Emphysema lung ([place]) 03/18/2018   Encounter for general adult medical examination without  abnormal findings 01/17/2015   Ex-cigarette smoker 01/20/2021   Exudative age-related macular degeneration of left eye with active choroidal neovascularization (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   Noninfective gastroenteritis and colitis, unspecified 10/04/2020   Nuclear sclerotic cataract of right eye 12/18/2019   Other specified symptoms and signs involving the digestive system and abdomen 10/04/2020   Overweight 09/14/2018   Oxygen dependent 10/04/2020   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   Personal history of colonic polyps 10/04/2020   Posterior vitreous detachment of right eye 12/18/2019   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   Pulmonary hypertension ([place])    Right upper lobe pulmonary nodule 03/25/2018   Syncope and collapse 12/12/2018   TIA (transient ischemic attack)    Transient ischemic attack 10/24/2009   Weight decreased 10/04/2020   Past Surgical History:  Procedure Laterality Date   none      FAMILY HISTORY Family History  Problem Relation Age of Onset   Obesity Mother    Memory loss Mother     SOCIAL HISTORY Social History   Tobacco Use   Smoking status: Former    Packs/day: 2.00    Years: 25.00    Total pack years: 50.00    Types: Cigarettes    Quit date: 1994    Years since quitting: 29.4   Smokeless tobacco: Never  Substance Use Topics   Alcohol use: Yes    Alcohol/week: 15.0 - 20.0 standard drinks of alcohol    Types: 15 - 20 Cans of beer per week    Drug use: Never         OPHTHALMIC EXAM:  Base Eye Exam     Visual Acuity (ETDRS)       Right Left   Dist [place] 20/25 -2 20/20 -1         Tonometry (Tonopen, 3:36 PM)       Right Left   Pressure 11 10         Pupils       Pupils APD   Right PERRL None   Left PERRL None         Visual Fields       Left Right    Full Full         Extraocular Movement  Right Left    Full Full         Neuro/Psych     Oriented x3: Yes   Mood/Affect: Normal         Dilation     Left eye: 2.5% Phenylephrine, 1.0% Mydriacyl @ 3:35 PM           Slit Lamp and Fundus Exam     External Exam       Right Left   External Normal Normal         Slit Lamp Exam       Right Left   Lids/Lashes Normal Normal   Conjunctiva/Sclera White and quiet Subconjunctival hemorrhage, minor inferior, nonbullous, related to recent injection nonpathologic   Cornea Clear Clear   Anterior Chamber Deep and quiet Deep and quiet   Iris Round and reactive Round and reactive   Lens 2+ Nuclear sclerosis Posterior chamber intraocular lens   Anterior Vitreous Normal Normal         Fundus Exam       Right Left   Posterior Vitreous  Posterior vitreous detachment   Disc  Normal   C/D Ratio  0.1   Macula  Retinal pigment epithelial mottling, Early age related macular degeneration, no hemorrhage, Hard drusen, no exudates, Geographic atrophy, no macular thickening   Vessels  Normal   Periphery  Normal            IMAGING AND PROCEDURES  Imaging and Procedures for 01/26/22  OCT, Retina - OU - Both Eyes       Right Eye Quality was good. Scan locations included subfoveal. Central Foveal Thickness: 295. Progression has improved. Findings include no IRF, epiretinal membrane, subretinal fluid.   Left Eye Quality was good. Scan locations included subfoveal. Central Foveal Thickness: 310. Progression has improved. Findings include no IRF, retinal drusen , subretinal  hyper-reflective material, subretinal fluid.   Notes OD Minor epiretinal membrane,   OS, subretinal hyper reflective material in the region much less active active CNVM as compared to November 2022, fibrosis, improved post recent injection Avastin 1 weeks postinjection OS               ASSESSMENT/PLAN:  Subconjunctival bleed, left Minor 5 days postinjection but not pathologic  Observe OS     ICD-10-CM   1. Exudative age-related macular degeneration of left eye with active choroidal neovascularization (HCC)  H35.3221 OCT, Retina - OU - Both Eyes    CANCELED: Color Fundus Photography Optos - OU - Both Eyes    2. Subconjunctival bleed, left  H11.32       1.  OS, no pathological findings patient reassured minor related to recent minor injection intravitreal  2.  3.  Ophthalmic Meds Ordered this visit:  No orders of the defined types were placed in this encounter.      Return for As scheduled, DILATE OU, AVASTIN OCT, OD, OS.  There are no Patient Instructions on file for this visit.   Explained the diagnoses, plan, and follow up with the patient and they expressed understanding.  Patient expressed understanding of the importance of proper follow up care.   Clent Demark Juanice Warburton M.D. Diseases & Surgery of the Retina and Vitreous Retina & Diabetic [place] 01/26/22     Abbreviations: M myopia (nearsighted); A astigmatism; H hyperopia (farsighted); P presbyopia; Mrx spectacle prescription;  CTL contact lenses; OD right eye; OS left eye; OU both eyes  XT exotropia; ET esotropia; PEK punctate epithelial keratitis; PEE punctate epithelial  erosions; DES dry eye syndrome; MGD meibomian gland dysfunction; ATs artificial tears; PFAT's preservative free artificial tears; [place] nuclear sclerotic cataract; PSC posterior subcapsular cataract; ERM epi-retinal membrane; PVD posterior vitreous detachment; RD retinal detachment; DM diabetes mellitus; DR diabetic retinopathy; NPDR  non-proliferative diabetic retinopathy; PDR proliferative diabetic retinopathy; CSME clinically significant macular edema; DME diabetic macular edema; dbh dot blot hemorrhages; CWS cotton wool spot; POAG primary open angle glaucoma; C/D cup-to-disc ratio; HVF humphrey visual field; GVF goldmann visual field; OCT optical coherence tomography; IOP intraocular pressure; BRVO Branch retinal vein occlusion; CRVO central retinal vein occlusion; CRAO central retinal artery occlusion; BRAO branch retinal artery occlusion; RT retinal tear; SB scleral buckle; PPV pars plana vitrectomy; VH Vitreous hemorrhage; PRP panretinal laser photocoagulation; IVK intravitreal kenalog; VMT vitreomacular traction; MH Macular hole;  NVD neovascularization of the disc; NVE neovascularization elsewhere; AREDS age related eye disease study; ARMD age related macular degeneration; POAG primary open angle glaucoma; EBMD epithelial/anterior basement membrane dystrophy; ACIOL anterior chamber intraocular lens; IOL intraocular lens; PCIOL posterior chamber intraocular lens; Phaco/IOL phacoemulsification with intraocular lens placement; [place] photorefractive keratectomy; LASIK laser assisted in situ keratomileusis; HTN hypertension; DM diabetes mellitus; COPD chronic obstructive pulmonary disease

## 2022-02-02 NOTE — Progress Notes (Unsigned)
Patient: Frank Meyer Date of Birth: [birth year]  Reason for Visit: Follow up History from: Patient Primary Neurologist: Dr. April Manson  ASSESSMENT AND PLAN 81 y.o. year old male   Seizure disorder  -No further events since started Keppra, will keep lower dose 250/500 mg daily (switched to 250 mg tablet for ease of admin) -Check Keppra today  -EEG has shown right temporal slowing -No driving until seizure free 6 months  -Follow-up in 6 months, call for any seizure type events  2. Mild Cognitive Impairment  -MMSE 28/30 -Continue Aricept 10 mg daily  3. Insomnia -On Trazodone 200 mg at night, long term from Dr. Jannifer Franklin, will continue this for now, see about transitioning this to PCP  Meds ordered this encounter  Medications   levETIRAcetam (KEPPRA) 250 MG tablet    Sig: Take 1 in the morning, take 2 at night    Dispense:  270 tablet    Refill:  4   donepezil (ARICEPT) 10 MG tablet    Sig: Take 1 tablet (10 mg total) by mouth daily.    Dispense:  90 tablet    Refill:  3    Requesting 1 year supply   traZODone (DESYREL) 100 MG tablet    Sig: Take 2 tablets (200 mg total) by mouth at bedtime.    Dispense:  180 tablet    Refill:  1    HISTORY OF PRESENT ILLNESS: Today 02/03/22 Frank Meyer is here today for follow-up.  MMSE 27/30. Is on Aricept.  Started Keppra 500 mg twice daily in May 2023 for concern of seizure.  EEG showed right temporal slowing consistent with an area of neuronal dysfunction.  Keppra was reduced 250/500 mg due to daytime somnolence. Not sure if fatigue is less with medication, because not as active since can't drive. Not playing golf anymore, for variety of reasons. On oxygen 24/7. Lives with wife. Feels memory has declined, but hard to say since not driving or maintaining golf score. He manages finances, has gotten his wife involved in taxes. He manages medications. He mows the yard. Last seizure spell was May 7th. Takes Trazodone 200 mg at  bedtime for sleep.   HISTORY  01/08/2022 Dr. April Manson: This is a 81 year old gentleman with past medical history of pulmonary hypertension, requiring 24/7 supplemental oxygen, memory decline, who is presenting with wife with 3 episodes of seizure-like activity during sleep for the past 2 years.  Wife reports patient has been having activities described as shaking of the upper extremities in the middle of night during sleep, initially she thought he was dreaming but noted that it was very difficult to wake him up during the events.  Afterwards she reports that patient will feel tired and complains of jaw soreness.  Wife reports last episode was 2 weeks ago and it was most severe as patient had shaking of the upper extremities, ?generalized convulsion, with difficulty to wake him up and associated with urinary incontinence.  After the shaking stopped, he was unresponsive for another 10 minutes.  Afterward he was able to take a shower and go to sleep in the guest room.  The next day patient attempted to play golf but he has to come sooner because he was extremely tired.  He denies any previous history of seizures, previously has was followed by Dr. Jannifer Franklin for cognitive impairment/dementia.  He did not present to the emergency department after each episode but presented to the ED on 3/23 after a syncope while playing  golf. Work up at that time was unrevealing  REVIEW OF SYSTEMS: Out of a complete 14 system review of symptoms, the patient complains only of the following symptoms, and all other reviewed systems are negative.  See HPI  ALLERGIES: No Known Allergies  HOME MEDICATIONS: Outpatient Medications Prior to Visit  Medication Sig Dispense Refill   aspirin 81 MG EC tablet Take 1 tablet by mouth in the morning and at bedtime.     atorvastatin (LIPITOR) 20 MG tablet Take 20 mg by mouth at bedtime.     cholecalciferol (VITAMIN D) 1000 UNITS tablet Take 1,000 Units by mouth daily.     donepezil (ARICEPT)  10 MG tablet TAKE 1 TABLET BY MOUTH  DAILY (Patient taking differently: Take 10 mg by mouth at bedtime.) 90 tablet 3   fexofenadine (ALLEGRA) 180 MG tablet Take 180 mg by mouth daily.     finasteride (PROSCAR) 5 MG tablet Take 5 mg by mouth daily.     ipratropium (ATROVENT) 0.06 % nasal spray Place 2 sprays into both nostrils daily as needed for rhinitis.      levETIRAcetam (KEPPRA) 500 MG tablet Take 1 tablet (500 mg total) by mouth 2 (two) times daily. 180 tablet 4   levETIRAcetam (KEPPRA) 500 MG tablet TAKE 1 TABLET(500 MG) BY MOUTH TWICE DAILY 180 tablet 0   Melatonin 10 MG TABS Take 10 mg by mouth at bedtime.     omeprazole (PRILOSEC) 40 MG capsule Take 40 mg by mouth daily.      OXYGEN Inhale 5 L into the lungs as directed. 5lt during the day and 2lt at night     sacubitril-valsartan (ENTRESTO) 24-26 MG Take 1 tablet by mouth 2 (two) times daily. (Patient taking differently: Take 1 tablet by mouth daily. At 3 pm) 180 tablet 1   thiamine 100 MG tablet Take 1 tablet (100 mg total) by mouth daily. 30 tablet 0   traZODone (DESYREL) 100 MG tablet TAKE 2 TABLETS BY MOUTH AT  BEDTIME (Patient taking differently: Take 200 mg by mouth at bedtime.) 180 tablet 0   umeclidinium-vilanterol (ANORO ELLIPTA) 62.5-25 MCG/ACT AEPB Inhale 1 puff into the lungs daily. 180 each 3   zaleplon (SONATA) 10 MG capsule Take 10 mg by mouth at bedtime as needed for sleep.     nitroGLYCERIN (NITROSTAT) 0.4 MG SL tablet Place 1 tablet (0.4 mg total) under the tongue every 5 (five) minutes as needed. Chest pain (Patient taking differently: Place 0.4 mg under the tongue every 5 (five) minutes as needed for chest pain. Chest pain) 25 tablet 6   No facility-administered medications prior to visit.    PAST MEDICAL HISTORY: Past Medical History:  Diagnosis Date   Allergic rhinitis 10/24/2009   Altered bowel habits 10/04/2020   Basal cell carcinoma    Benign prostatic hyperplasia with lower urinary tract symptoms 10/24/2009    Benign prostatic hypertrophy    Bradycardia 12/06/2019   Callosity 10/16/2013   Cardiomegaly 01/23/2015   Chronic diastolic heart failure ([place]) 12/19/2018   Chronic respiratory failure ([place]) 10/04/2020   Coronary artery calcification seen on CAT scan 05/28/2020   Dyslipidemia    Dyspnea on exertion 03/18/2018   Emphysema lung ([place]) 03/18/2018   Encounter for general adult medical examination without abnormal findings 01/17/2015   Ex-cigarette smoker 01/20/2021   Exudative age-related macular degeneration of left eye with active choroidal neovascularization ([place]) 12/18/2019   Gallbladder problem    Gastroesophageal reflux disease    Hiatal hernia  Hilar adenopathy 03/25/2018   History of smoking 03/18/2018   Hyperlipidemia 10/24/2009   Insomnia 05/09/2013   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   Internal hemorrhoid 10/04/2020   Macular pucker, right eye 04/30/2020   Mediastinal adenopathy 03/25/2018   Memory loss 05/09/2013   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   Mild memory disturbance    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   Noninfective gastroenteritis and colitis, unspecified 10/04/2020   Nuclear sclerotic cataract of right eye 12/18/2019   Other specified symptoms and signs involving the digestive system and abdomen 10/04/2020   Overweight 09/14/2018   Oxygen dependent 10/04/2020   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   Personal history of colonic polyps 10/04/2020   Posterior vitreous detachment of right eye 12/18/2019   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   Pulmonary hypertension ([place])    Right upper lobe pulmonary nodule 03/25/2018   Syncope and collapse 12/12/2018   TIA (transient ischemic attack)    Transient ischemic attack 10/24/2009   Weight decreased 10/04/2020    PAST SURGICAL HISTORY: Past Surgical History:  Procedure Laterality Date   none      FAMILY HISTORY: Family History  Problem Relation Age of  Onset   Obesity Mother    Memory loss Mother     SOCIAL HISTORY: Social History   Socioeconomic History   Marital status: Married    Spouse name: pamela   Number of children: 2   Years of education: college   Highest education level: Not on file  Occupational History   Occupation: retired  Tobacco Use   Smoking status: Former    Packs/day: 2.00    Years: 25.00    Total pack years: 50.00    Types: Cigarettes    Quit date: 1994    Years since quitting: 29.4   Smokeless tobacco: Never  Substance and Sexual Activity   Alcohol use: Yes    Alcohol/week: 15.0 - 20.0 standard drinks of alcohol    Types: 15 - 20 Cans of beer per week   Drug use: Never   Sexual activity: Not on file  Other Topics Concern   Not on file  Social History Narrative   Pt is retired. He is married. The pt lives with his wife. He has a Best boy. The pt has 2 children.      Patient drinks 2-3 cups of caffeine daily.   Patient is right handed.    Social Determinants of Health   Financial Resource Strain: Not on file  Food Insecurity: Not on file  Transportation Needs: Not on file  Physical Activity: Not on file  Stress: Not on file  Social Connections: Not on file  Intimate Partner Violence: Not on file    PHYSICAL EXAM  Vitals:   02/03/22 1028  BP: 104/61  Pulse: 72  Weight: 161 lb 8 oz (73.3 kg)  Height: '5\' 6"'$  (1.676 m)   Body mass index is 26.07 kg/m.    02/03/2022   10:31 AM 05/29/2021    9:57 AM 05/29/2020   10:21 AM  MMSE - Mini Mental State Exam  Orientation to time '4 5 5  '$ Orientation to Place '5 5 5  '$ Registration '3 3 3  '$ Attention/ Calculation '5 5 5  '$ Recall '2 2 2  '$ Language- name 2 objects '2 2 2  '$ Language- repeat '1 1 1  '$ Language- follow 3 step command '3 3 3  '$ Language- read & follow direction 1 1 1  Write a sentence '1 1 1  '$ Copy design '1 1 1  '$ Total score '28 29 29   '$ Generalized: Well developed, in no acute distress  Neurological examination  Mentation: Alert  oriented to time, place, history taking. Follows all commands speech and language fluent Cranial nerve II-XII: Pupils were equal round reactive to light. Extraocular movements were full, visual field were full on confrontational test. Facial sensation and strength were normal.  Head turning and shoulder shrug  were normal and symmetric. Motor: The motor testing reveals 5 over 5 strength of all 4 extremities. Good symmetric motor tone is noted throughout.  Sensory: Sensory testing is intact to soft touch on all 4 extremities. No evidence of extinction is noted.  Coordination: Cerebellar testing reveals good finger-nose-finger and heel-to-shin bilaterally.  Gait and station: Gait is slightly wide based, but independent  Reflexes: Deep tendon reflexes are symmetric and normal bilaterally.   DIAGNOSTIC DATA (LABS, IMAGING, TESTING) - I reviewed patient records, labs, notes, testing and imaging myself where available.  Lab Results  Component Value Date   WBC 11.2 (H) 11/06/2021   HGB 12.6 (L) 11/06/2021   HCT 39.9 11/06/2021   MCV 88.3 11/06/2021   PLT 237 11/06/2021      Component Value Date/Time   NA 135 11/06/2021 1209   NA 139 10/28/2020 0930   K 4.9 11/06/2021 1331   CL 103 11/06/2021 1209   CO2 25 11/06/2021 1209   GLUCOSE 108 (H) 11/06/2021 1209   BUN 15 11/06/2021 1209   BUN 15 10/28/2020 0930   CREATININE 1.33 (H) 11/06/2021 1209   CALCIUM 8.6 (L) 11/06/2021 1209   PROT 6.8 06/13/2020 0819   ALBUMIN 3.7 06/13/2020 0819   AST 25 06/13/2020 0819   ALT 20 06/13/2020 0819   ALKPHOS 71 06/13/2020 0819   BILITOT 0.9 06/13/2020 0819   GFRNONAA 54 (L) 11/06/2021 1209   GFRAA 64 06/13/2020 0819   Lab Results  Component Value Date   CHOL 147 06/13/2020   HDL 57 06/13/2020   LDLCALC 67 06/13/2020   TRIG 129 06/13/2020   CHOLHDL 2.6 06/13/2020   No results found for: "HGBA1C" No results found for: "VITAMINB12" Lab Results  Component Value Date   TSH 1.890 12/12/2018     Butler Denmark, AGNP-C, DNP 02/03/2022, 10:44 AM [facility] [address], [place], [place] [address] [phone]

## 2022-02-03 ENCOUNTER — Ambulatory Visit: Payer: Medicare Other | Admitting: Neurology

## 2022-02-03 ENCOUNTER — Other Ambulatory Visit: Payer: Self-pay

## 2022-02-03 ENCOUNTER — Encounter: Payer: Self-pay | Admitting: Neurology

## 2022-02-03 VITALS — BP 104/61 | HR 72 | Ht 66.0 in | Wt 161.5 lb

## 2022-02-03 DIAGNOSIS — F09 Unspecified mental disorder due to known physiological condition: Secondary | ICD-10-CM

## 2022-02-03 DIAGNOSIS — G40909 Epilepsy, unspecified, not intractable, without status epilepticus: Secondary | ICD-10-CM

## 2022-02-03 DIAGNOSIS — R569 Unspecified convulsions: Secondary | ICD-10-CM | POA: Diagnosis not present

## 2022-02-03 MED ORDER — TRAZODONE HCL 100 MG PO TABS
200.0000 mg | ORAL_TABLET | Freq: Every day | ORAL | 1 refills | Status: DC
Start: 2022-02-03 — End: 2022-08-25

## 2022-02-03 MED ORDER — LEVETIRACETAM 250 MG PO TABS: ORAL_TABLET | ORAL | 4 refills | Status: DC

## 2022-02-03 MED ORDER — DONEPEZIL HCL 10 MG PO TABS
10.0000 mg | ORAL_TABLET | Freq: Every day | ORAL | 3 refills | Status: DC
Start: 2022-02-03 — End: 2022-10-20

## 2022-02-03 NOTE — Patient Instructions (Signed)
Continue lower dose of Keppra 250 mg AM/500 mg PM Check Keppra level today  No driving until seizure free 6 months Call for any seizure spells  Meds ordered this encounter  Medications   levETIRAcetam (KEPPRA) 250 MG tablet    Sig: Take 1 in the morning, take 2 at night    Dispense:  270 tablet    Refill:  4   donepezil (ARICEPT) 10 MG tablet    Sig: Take 1 tablet (10 mg total) by mouth daily.    Dispense:  90 tablet    Refill:  3    Requesting 1 year supply   traZODone (DESYREL) 100 MG tablet    Sig: Take 2 tablets (200 mg total) by mouth at bedtime.    Dispense:  180 tablet    Refill:  1

## 2022-02-04 LAB — LEVETIRACETAM LEVEL: Levetiracetam Lvl: 12.8 ug/mL (ref 10.0–40.0)

## 2022-02-20 ENCOUNTER — Telehealth: Payer: Self-pay | Admitting: Cardiology

## 2022-02-20 ENCOUNTER — Telehealth: Payer: Self-pay

## 2022-02-20 ENCOUNTER — Other Ambulatory Visit: Payer: Self-pay

## 2022-02-20 MED ORDER — ENTRESTO 24-26 MG PO TABS: 1.0000 | ORAL_TABLET | Freq: Every day | ORAL | 3 refills | Status: DC

## 2022-02-20 NOTE — Telephone Encounter (Signed)
Spoke with pt about monitor results. He verbalized understanding and had no questions or concerns.

## 2022-02-20 NOTE — Telephone Encounter (Signed)
*  STAT* If patient is at the pharmacy, call can be transferred to refill team.   1. Which medications need to be refilled? (please list name of each medication and dose if known) New prescription for [facility]  for 1 tablet 1 time a day please cancel the prescription for 1 tablet 2 times a day  2. Which pharmacy/location (including street and city if local pharmacy) is medication to be sent to? [facility]  3. Do they need a 30 day or 90 day supply? 90 days and refills

## 2022-02-25 ENCOUNTER — Telehealth: Payer: Self-pay | Admitting: Cardiology

## 2022-02-25 NOTE — Telephone Encounter (Signed)
  Pt c/o medication issue:  1. Name of Medication: sacubitril-valsartan (ENTRESTO) 24-26 MG  2. How are you currently taking this medication (dosage and times per day)? Take 1 tablet by mouth daily.  3. Are you having a reaction (difficulty breathing--STAT)? No   4. What is your medication issue? Pt's wife said, per [facility] they need some PA for this medication. She gave [facility] phone [phone]

## 2022-02-27 NOTE — Telephone Encounter (Signed)
I spoke with prior [place] and she check and there is no PA required and she ran claims for past, present and future dates and all claim went through without a need of PA. Patient wife notified.

## 2022-03-02 ENCOUNTER — Telehealth: Payer: Self-pay | Admitting: Cardiology

## 2022-03-02 NOTE — Telephone Encounter (Signed)
Pt c/o medication issue:  1. Name of Medication: sacubitril-valsartan (ENTRESTO) 24-26 MG  2. How are you currently taking this medication (dosage and times per day)? As prescribed   3. Are you having a reaction (difficulty breathing--STAT)? No   4. What is your medication issue? [facility] advised today they cannot release this medication to the patient without speaking with our office prior. Spouse states they have already been charged for the medication, but they aren't sending it until then.

## 2022-03-02 NOTE — Telephone Encounter (Signed)
[facility] stated that they had not released medication because the dose of 1 tablet daily was not a typical dose. Advised that the dose had been decreased with the knowledge of the physician.

## 2022-03-10 ENCOUNTER — Ambulatory Visit (INDEPENDENT_AMBULATORY_CARE_PROVIDER_SITE_OTHER): Payer: Medicare Other | Admitting: Ophthalmology

## 2022-03-10 ENCOUNTER — Encounter (INDEPENDENT_AMBULATORY_CARE_PROVIDER_SITE_OTHER): Payer: Self-pay | Admitting: Ophthalmology

## 2022-03-10 DIAGNOSIS — H353211 Exudative age-related macular degeneration, right eye, with active choroidal neovascularization: Secondary | ICD-10-CM

## 2022-03-10 DIAGNOSIS — H353221 Exudative age-related macular degeneration, left eye, with active choroidal neovascularization: Secondary | ICD-10-CM | POA: Diagnosis not present

## 2022-03-10 DIAGNOSIS — H35371 Puckering of macula, right eye: Secondary | ICD-10-CM

## 2022-03-10 DIAGNOSIS — H2511 Age-related nuclear cataract, right eye: Secondary | ICD-10-CM

## 2022-03-10 MED ORDER — BEVACIZUMAB 2.5 MG/0.1ML IZ SOSY
2.5000 mg | PREFILLED_SYRINGE | INTRAVITREAL | Status: AC | PRN
  Administered 2022-03-10: 2.5 mg via INTRAVITREAL

## 2022-03-10 NOTE — Assessment & Plan Note (Signed)
Vastly improved and stabilized currently at interval follow-up of 7 weeks repeat injection today to maintain as treatment is ongoing for less than 1 year

## 2022-03-10 NOTE — Progress Notes (Signed)
03/10/2022     CHIEF COMPLAINT Patient presents for  Chief Complaint  Patient presents with   Macular Degeneration      HISTORY OF PRESENT ILLNESS: Frank Meyer is a 81 y.o. male who presents to the clinic today for:   HPI   No interval change in acuity per patient.  Also patient continues on full-time oxygen concentrator  History of bilateral wet AMD.  Last visit here 7 weeks previous and bilateral injections to continue to maintain visual functioning and prevent CNVM progression Last edited by Hurman Horn, MD on 03/10/2022 10:29 AM.      Referring physician: Burnard Bunting, MD [place],  [place] [address]  HISTORICAL INFORMATION:   Selected notes from the [facility]: No current outpatient medications on file. (Ophthalmic Drugs)   No current facility-administered medications for this visit. (Ophthalmic Drugs)   Current Outpatient Medications (Other)  Medication Sig   aspirin 81 MG EC tablet Take 1 tablet by mouth in the morning and at bedtime.   atorvastatin (LIPITOR) 20 MG tablet Take 20 mg by mouth at bedtime.   cholecalciferol (VITAMIN D) 1000 UNITS tablet Take 1,000 Units by mouth daily.   donepezil (ARICEPT) 10 MG tablet Take 1 tablet (10 mg total) by mouth daily.   fexofenadine (ALLEGRA) 180 MG tablet Take 180 mg by mouth daily.   finasteride (PROSCAR) 5 MG tablet Take 5 mg by mouth daily.   ipratropium (ATROVENT) 0.06 % nasal spray Place 2 sprays into both nostrils daily as needed for rhinitis.    levETIRAcetam (KEPPRA) 250 MG tablet Take 1 in the morning, take 2 at night   Melatonin 10 MG TABS Take 10 mg by mouth at bedtime.   nitroGLYCERIN (NITROSTAT) 0.4 MG SL tablet Place 1 tablet (0.4 mg total) under the tongue every 5 (five) minutes as needed. Chest pain (Patient taking differently: Place 0.4 mg under the tongue every 5 (five) minutes as needed for chest pain. Chest pain)   omeprazole  (PRILOSEC) 40 MG capsule Take 40 mg by mouth daily.    OXYGEN Inhale 5 L into the lungs as directed. 5lt during the day and 2lt at night   sacubitril-valsartan (ENTRESTO) 24-26 MG Take 1 tablet by mouth daily.   thiamine 100 MG tablet Take 1 tablet (100 mg total) by mouth daily.   traZODone (DESYREL) 100 MG tablet Take 2 tablets (200 mg total) by mouth at bedtime.   umeclidinium-vilanterol (ANORO ELLIPTA) 62.5-25 MCG/ACT AEPB Inhale 1 puff into the lungs daily.   zaleplon (SONATA) 10 MG capsule Take 10 mg by mouth at bedtime as needed for sleep.   No current facility-administered medications for this visit. (Other)      REVIEW OF SYSTEMS: ROS   Negative for: Constitutional, Gastrointestinal, Neurological, Skin, Genitourinary, Musculoskeletal, HENT, Endocrine, Cardiovascular, Eyes, Respiratory, Psychiatric, Allergic/Imm, Heme/Lymph Last edited by Hurman Horn, MD on 03/10/2022 10:27 AM.       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/2018   Encounter for general adult medical examination without abnormal findings 01/17/2015   Ex-cigarette smoker 01/20/2021   Exudative age-related macular degeneration of left eye with active choroidal neovascularization ([place]) 12/18/2019   Gallbladder problem    Gastroesophageal reflux disease    Hiatal hernia    Hilar adenopathy 03/25/2018   History of smoking 03/18/2018   Hyperlipidemia 10/24/2009   Insomnia  05/09/2013   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   Internal hemorrhoid 10/04/2020   Macular pucker, right eye 04/30/2020   Mediastinal adenopathy 03/25/2018   Memory loss 05/09/2013   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   Mild memory disturbance    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   Noninfective gastroenteritis and colitis, unspecified 10/04/2020   Nuclear sclerotic cataract of right eye 12/18/2019   Other specified symptoms and signs involving the digestive system and abdomen 10/04/2020   Overweight 09/14/2018   Oxygen dependent 10/04/2020   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   Personal history of colonic polyps 10/04/2020   Posterior vitreous detachment of right eye 12/18/2019   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   Pulmonary hypertension ([place])    Right upper lobe pulmonary nodule 03/25/2018   Syncope and collapse 12/12/2018   TIA (transient ischemic attack)    Transient ischemic attack 10/24/2009   Weight decreased 10/04/2020   Past Surgical History:  Procedure Laterality Date   none      FAMILY HISTORY Family History  Problem Relation Age of Onset   Obesity Mother    Memory loss Mother     SOCIAL HISTORY Social History   Tobacco Use   Smoking status: Former    Packs/day: 2.00    Years: 25.00    Total pack years: 50.00    Types: Cigarettes    Quit date: 1994    Years since quitting: 29.5   Smokeless tobacco: Never  Substance Use Topics   Alcohol use: Yes    Alcohol/week: 15.0 - 20.0 standard drinks of alcohol    Types: 15 - 20 Cans of beer per week   Drug use: Never         OPHTHALMIC EXAM:  Base Eye Exam     Visual Acuity (ETDRS)       Right Left   Dist [place] 20/25 -1 20/20         Tonometry (Tonopen, 10:31 AM)       Right Left   Pressure 7 9         Pupils       Pupils APD   Right PERRL None   Left PERRL None         Neuro/Psych     Oriented x3: Yes    Mood/Affect: Normal         Dilation     Both eyes: 1.0% Mydriacyl, 2.5% Phenylephrine @ 10:28 AM           Slit Lamp and Fundus Exam     External Exam       Right Left   External Normal Normal         Slit Lamp Exam       Right Left   Lids/Lashes Normal Normal   Conjunctiva/Sclera White and quiet Subconjunctival hemorrhage, minor inferior, nonbullous, related to recent injection nonpathologic   Cornea Clear Clear   Anterior Chamber Deep and quiet Deep and quiet  Iris Round and reactive Round and reactive   Lens 2+ Nuclear sclerosis Posterior chamber intraocular lens   Anterior Vitreous Normal Normal         Fundus Exam       Right Left   Posterior Vitreous Normal Posterior vitreous detachment   Disc Normal Normal   C/D Ratio 0.1 0.1   Macula Early age related macular degeneration, Hard drusen, no macular thickening Retinal pigment epithelial mottling, Early age related macular degeneration, no hemorrhage, Hard drusen, no exudates, Geographic atrophy, no macular thickening   Vessels Normal Normal   Periphery Normal Normal            IMAGING AND PROCEDURES  Imaging and Procedures for 03/10/22  OCT, Retina - OU - Both Eyes       Right Eye Quality was good. Scan locations included subfoveal. Central Foveal Thickness: 308. Progression has improved. Findings include no IRF, epiretinal membrane, subretinal fluid.   Left Eye Quality was good. Scan locations included subfoveal. Central Foveal Thickness: 303. Progression has improved. Findings include no IRF, retinal drusen , subretinal hyper-reflective material, subretinal fluid.   Notes OD Minor epiretinal membrane, but improved as compared to onset November 2022  OS, subretinal hyper reflective material in the region much less active active CNVM as compared to November 2022, fibrosis, improved post recent injection Avastin 1 weeks postinjection OS       Intravitreal Injection, Pharmacologic Agent -  OD - Right Eye       Time Out 03/10/2022. 11:25 AM. Confirmed correct patient, procedure, site, and patient consented.   Anesthesia Topical anesthesia was used. Anesthetic medications included Lidocaine 4%.   Procedure Preparation included 5% betadine to ocular surface, 10% betadine to eyelids. A 30 gauge needle was used.   Injection: 2.5 mg bevacizumab 2.5 MG/0.1ML   Route: Intravitreal, Site: Right Eye   NDC: [phone], Lot: [id number], Expiration date: 04/26/2022, Waste: 0 mL   Post-op Post injection exam found visual acuity of at least counting fingers. The patient tolerated the procedure well. There were no complications. The patient received written and verbal post procedure care education. Post injection medications included ocuflox.      Intravitreal Injection, Pharmacologic Agent - OS - Left Eye       Time Out 03/10/2022. 11:25 AM. Confirmed correct patient, procedure, site, and patient consented.   Anesthesia Topical anesthesia was used. Anesthetic medications included Lidocaine 4%.   Procedure Preparation included 5% betadine to ocular surface, 10% betadine to eyelids, Tobramycin 0.3%. A 30 gauge needle was used.   Injection: 2.5 mg bevacizumab 2.5 MG/0.1ML   Route: Intravitreal, Site: Left Eye   NDC: [phone], Lot: [id number], Expiration date: 04/24/2022, Waste: 0 mL   Post-op Post injection exam found visual acuity of at least counting fingers. The patient tolerated the procedure well. There were no complications. The patient received written and verbal post procedure care education. Post injection medications included ocuflox.              ASSESSMENT/PLAN:  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HCC) Vastly improved and stabilized currently at interval follow-up of 7 weeks repeat injection today to maintain as treatment is ongoing for less than 1 year  Exudative age-related macular degeneration  of left eye with active choroidal neovascularization ([place]) Vastly improved and stabilized currently at interval follow-up of 7 weeks repeat injection today to maintain as treatment is ongoing for less than 1 year  Macular pucker, right eye Minor OD  Nuclear sclerotic cataract of right eye Follow-up with [facility] as scheduled     ICD-10-CM   1. Exudative age-related macular degeneration of left eye with active choroidal neovascularization (HCC)  H35.3221 OCT, Retina - OU - Both Eyes    Intravitreal Injection, Pharmacologic Agent - OS - Left Eye    bevacizumab (AVASTIN) SOSY 2.5 mg    2. 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HCC)  H35.3211 OCT, Retina - OU - Both Eyes    Intravitreal Injection, Pharmacologic Agent - OD - Right Eye    bevacizumab (AVASTIN) SOSY 2.5 mg    3. Macular pucker, right eye  H35.371     4. Nuclear sclerotic cataract of right eye  H25.11       1.  OU with active CNVM simultaneous onset and findings November 2022, each eye improved macular anatomy and preserved acuity and currently at 6 weeks 6-day follow-up.  Repeat injection today Avastin OU and follow-up next again in 7 weeks  2.  Cataract is becoming visually significant but recent evaluation [facility] follow-up as scheduled  3.  Ophthalmic Meds Ordered this visit:  Meds ordered this encounter  Medications   bevacizumab (AVASTIN) SOSY 2.5 mg   bevacizumab (AVASTIN) SOSY 2.5 mg       Return in about 7 weeks (around 04/28/2022) for DILATE OU, AVASTIN OCT, OD, OS.  There are no Patient Instructions on file for this visit.   Explained the diagnoses, plan, and follow up with the patient and they expressed understanding.  Patient expressed understanding of the importance of proper follow up care.   Clent Demark Dayne Chait M.D. Diseases & Surgery of the Retina and Vitreous [facility] 03/10/22     Abbreviations: M  myopia (nearsighted); A astigmatism; H hyperopia (farsighted); P presbyopia; Mrx spectacle prescription;  CTL contact lenses; OD right eye; OS left eye; OU both eyes  XT exotropia; ET esotropia; PEK punctate epithelial keratitis; PEE punctate epithelial erosions; DES dry eye syndrome; MGD meibomian gland dysfunction; ATs artificial tears; PFAT's preservative free artificial tears; [place] nuclear sclerotic cataract; PSC posterior subcapsular cataract; ERM epi-retinal membrane; PVD posterior vitreous detachment; RD retinal detachment; DM diabetes mellitus; DR diabetic retinopathy; NPDR non-proliferative diabetic retinopathy; PDR proliferative diabetic retinopathy; CSME clinically significant macular edema; DME diabetic macular edema; dbh dot blot hemorrhages; CWS cotton wool spot; POAG primary open angle glaucoma; C/D cup-to-disc ratio; HVF humphrey visual field; GVF goldmann visual field; OCT optical coherence tomography; IOP intraocular pressure; BRVO Branch retinal vein occlusion; CRVO central retinal vein occlusion; CRAO central retinal artery occlusion; BRAO branch retinal artery occlusion; RT retinal tear; SB scleral buckle; PPV pars plana vitrectomy; VH Vitreous hemorrhage; PRP panretinal laser photocoagulation; IVK intravitreal kenalog; VMT vitreomacular traction; MH Macular hole;  NVD neovascularization of the disc; NVE neovascularization elsewhere; AREDS age related eye disease study; ARMD age related macular degeneration; POAG primary open angle glaucoma; EBMD epithelial/anterior basement membrane dystrophy; ACIOL anterior chamber intraocular lens; IOL intraocular lens; PCIOL posterior chamber intraocular lens; Phaco/IOL phacoemulsification with intraocular lens placement; [place] photorefractive keratectomy; LASIK laser assisted in situ keratomileusis; HTN hypertension; DM diabetes mellitus; COPD chronic obstructive pulmonary disease

## 2022-03-10 NOTE — Assessment & Plan Note (Signed)
Follow-up with [facility] as scheduled

## 2022-03-10 NOTE — Assessment & Plan Note (Signed)
Minor OD

## 2022-03-11 ENCOUNTER — Encounter (INDEPENDENT_AMBULATORY_CARE_PROVIDER_SITE_OTHER): Payer: Medicare Other | Admitting: Ophthalmology

## 2022-04-05 ENCOUNTER — Other Ambulatory Visit: Payer: Self-pay | Admitting: Neurology

## 2022-04-15 ENCOUNTER — Ambulatory Visit: Payer: Medicare Other | Admitting: Neurology

## 2022-04-28 ENCOUNTER — Ambulatory Visit (INDEPENDENT_AMBULATORY_CARE_PROVIDER_SITE_OTHER): Payer: Medicare Other | Admitting: Ophthalmology

## 2022-04-28 ENCOUNTER — Encounter (INDEPENDENT_AMBULATORY_CARE_PROVIDER_SITE_OTHER): Payer: Self-pay | Admitting: Ophthalmology

## 2022-04-28 DIAGNOSIS — H353221 Exudative age-related macular degeneration, left eye, with active choroidal neovascularization: Secondary | ICD-10-CM

## 2022-04-28 DIAGNOSIS — H353211 Exudative age-related macular degeneration, right eye, with active choroidal neovascularization: Secondary | ICD-10-CM | POA: Diagnosis not present

## 2022-04-28 DIAGNOSIS — H35371 Puckering of macula, right eye: Secondary | ICD-10-CM | POA: Diagnosis not present

## 2022-04-28 DIAGNOSIS — H2511 Age-related nuclear cataract, right eye: Secondary | ICD-10-CM | POA: Diagnosis not present

## 2022-04-28 DIAGNOSIS — H43811 Vitreous degeneration, right eye: Secondary | ICD-10-CM

## 2022-04-28 MED ORDER — BEVACIZUMAB CHEMO INJECTION 1.25MG/0.05ML SYRINGE FOR KALEIDOSCOPE
1.2500 mg | INTRAVITREAL | Status: AC | PRN
  Administered 2022-04-28: 1.25 mg via INTRAVITREAL

## 2022-04-28 NOTE — Assessment & Plan Note (Signed)
Stable OD no holes or tears

## 2022-04-28 NOTE — Assessment & Plan Note (Signed)
Onset slightly less than 1 year prior, no recurrence of 7 weeks.  Repeat injection today and evaluate next 9 weeks

## 2022-04-28 NOTE — Assessment & Plan Note (Signed)
ModerateThe nature of cataract was discussed with the patient as well as the elective nature of surgery. The patient was reassured that surgery at a later date does not put the patient at risk for a worse outcome. It was emphasized that the need for surgery is dictated by the patient's quality of life as influenced by the cataract. Patient was instructed to maintain close follow up with their general eye care doctor.

## 2022-04-28 NOTE — Assessment & Plan Note (Signed)
Onset slightly less than 1 year before at 7-week interval no sign of recurrence posttreatment.  Repeat injection today and extend interval examination to 9 weeks

## 2022-04-28 NOTE — Assessment & Plan Note (Signed)
Minor at present no impact on acuity

## 2022-04-28 NOTE — Progress Notes (Signed)
04/28/2022     CHIEF COMPLAINT Patient presents for  Chief Complaint  Patient presents with   Macular Degeneration      HISTORY OF PRESENT ILLNESS: Frank Meyer is a 81 y.o. 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      Referring physician: Burnard Bunting, MD [place],  [place] [address]  HISTORICAL INFORMATION:   Selected notes from the [facility]: No current outpatient medications on file. (Ophthalmic Drugs)   No current facility-administered medications for this visit. (Ophthalmic Drugs)   Current Outpatient Medications (Other)  Medication Sig   aspirin 81 MG EC tablet Take 1 tablet by mouth in the morning and at bedtime.   atorvastatin (LIPITOR) 20 MG tablet Take 20 mg by mouth at bedtime.   cholecalciferol (VITAMIN D) 1000 UNITS tablet Take 1,000 Units by mouth daily.   donepezil (ARICEPT) 10 MG tablet Take 1 tablet (10 mg total) by mouth daily.   fexofenadine (ALLEGRA) 180 MG tablet Take 180 mg by mouth daily.   finasteride (PROSCAR) 5 MG tablet Take 5 mg by mouth daily.   ipratropium (ATROVENT) 0.06 % nasal spray Place 2 sprays into both nostrils daily as needed for rhinitis.    levETIRAcetam (KEPPRA) 250 MG tablet Take 1 in the morning, take 2 at night   Melatonin 10 MG TABS Take 10 mg by mouth at bedtime.   nitroGLYCERIN (NITROSTAT) 0.4 MG SL tablet Place 1 tablet (0.4 mg total) under the tongue every 5 (five) minutes as needed. Chest pain (Patient taking differently: Place 0.4 mg under the tongue every 5 (five) minutes as needed for chest pain. Chest pain)   omeprazole (PRILOSEC) 40 MG capsule Take 40 mg by mouth daily.    OXYGEN Inhale 5 L into the  lungs as directed. 5lt during the day and 2lt at night   sacubitril-valsartan (ENTRESTO) 24-26 MG Take 1 tablet by mouth daily.   thiamine 100 MG tablet Take 1 tablet (100 mg total) by mouth daily.   traZODone (DESYREL) 100 MG tablet Take 2 tablets (200 mg total) by mouth at bedtime.   umeclidinium-vilanterol (ANORO ELLIPTA) 62.5-25 MCG/ACT AEPB Inhale 1 puff into the lungs daily.   zaleplon (SONATA) 10 MG capsule Take 10 mg by mouth at bedtime as needed for sleep.   No current facility-administered medications for this visit. (Other)      REVIEW OF SYSTEMS: ROS   Negative for: Constitutional, Gastrointestinal, Neurological, Skin, Genitourinary, Musculoskeletal, HENT, Endocrine, Cardiovascular, Eyes, Respiratory, Psychiatric, Allergic/Imm, Heme/Lymph Last edited by Orene Desanctis D, CMA on 04/28/2022  9:52 AM.       ALLERGIES No Known Allergies  PAST MEDICAL HISTORY Past Medical History:  Diagnosis Date   Allergic rhinitis 10/24/2009   Altered bowel habits 10/04/2020   Basal cell carcinoma    Benign prostatic hyperplasia with lower urinary tract symptoms 10/24/2009   Benign prostatic hypertrophy    Bradycardia 12/06/2019   Callosity 10/16/2013   Cardiomegaly 01/23/2015   Chronic diastolic heart failure ([place]) 12/19/2018   Chronic respiratory failure ([place]) 10/04/2020   Coronary artery calcification seen on CAT scan 05/28/2020   Dyslipidemia  Dyspnea on exertion 03/18/2018   Emphysema lung ([place]) 03/18/2018   Encounter for general adult medical examination without abnormal findings 01/17/2015   Ex-cigarette smoker 01/20/2021   Exudative age-related macular degeneration of left eye with active choroidal neovascularization ([place]) 12/18/2019   Gallbladder problem    Gastroesophageal reflux disease    Hiatal hernia    Hilar adenopathy 03/25/2018   History of smoking 03/18/2018   Hyperlipidemia 10/24/2009   Insomnia 05/09/2013   Intermediate stage nonexudative age-related macular degeneration of right  eye 07/30/2020   Internal hemorrhoid 10/04/2020   Macular pucker, right eye 04/30/2020   Mediastinal adenopathy 03/25/2018   Memory loss 05/09/2013   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   Mild memory disturbance    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   Noninfective gastroenteritis and colitis, unspecified 10/04/2020   Nuclear sclerotic cataract of right eye 12/18/2019   Other specified symptoms and signs involving the digestive system and abdomen 10/04/2020   Overweight 09/14/2018   Oxygen dependent 10/04/2020   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   Personal history of colonic polyps 10/04/2020   Posterior vitreous detachment of right eye 12/18/2019   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   Pulmonary hypertension ([place])    Right upper lobe pulmonary nodule 03/25/2018   Syncope and collapse 12/12/2018   TIA (transient ischemic attack)    Transient ischemic attack 10/24/2009   Weight decreased 10/04/2020   Past Surgical History:  Procedure Laterality Date   none      FAMILY HISTORY Family History  Problem Relation Age of Onset   Obesity Mother    Memory loss Mother     SOCIAL HISTORY Social History   Tobacco Use   Smoking status: Former    Packs/day: 2.00    Years: 25.00    Total pack years: 50.00    Types: Cigarettes    Quit date: 1994    Years since quitting: 29.7   Smokeless tobacco: Never  Substance Use Topics   Alcohol use: Yes    Alcohol/week: 15.0 - 20.0 standard drinks of alcohol    Types: 15 - 20 Cans of beer per week   Drug use: Never         OPHTHALMIC EXAM:  Base Eye Exam     Visual Acuity (ETDRS)       Right Left   Dist [place] 20/25 +1 20/20 -1    Correction: Glasses         Tonometry (Tonopen, 9:58 AM)       Right Left   Pressure 8 7         Pupils       Pupils   Right PERRL   Left PERRL         Visual Fields       Left Right    Full Full         Extraocular Movement       Right Left    Ortho Ortho     -- -- --  --  --  -- -- --   -- -- --  --  --  -- -- --           Neuro/Psych     Oriented x3: Yes   Mood/Affect: Normal         Dilation     Both eyes: 1.0% Mydriacyl, 2.5% Phenylephrine @ 9:53 AM           Slit Lamp and Fundus Exam  External Exam       Right Left   External Normal Normal         Slit Lamp Exam       Right Left   Lids/Lashes Normal Normal   Conjunctiva/Sclera White and quiet Subconjunctival hemorrhage, minor inferior, nonbullous, related to recent injection nonpathologic   Cornea Clear Clear   Anterior Chamber Deep and quiet Deep and quiet   Iris Round and reactive Round and reactive   Lens 2+ Nuclear sclerosis Posterior chamber intraocular lens   Anterior Vitreous Normal Normal         Fundus Exam       Right Left   Posterior Vitreous Posterior vitreous detachment Posterior vitreous detachment   Disc Normal Normal   C/D Ratio 0.1 0.1   Macula Early age related macular degeneration, Hard drusen, no macular thickening Retinal pigment epithelial mottling, Early age related macular degeneration, no hemorrhage, Hard drusen, no exudates, Geographic atrophy, no macular thickening   Vessels Normal Normal   Periphery Normal Normal            IMAGING AND PROCEDURES  Imaging and Procedures for 04/28/22  OCT, Retina - OU - Both Eyes       Right Eye Quality was good. Scan locations included subfoveal. Central Foveal Thickness: 308. Progression has improved. Findings include no IRF, epiretinal membrane, subretinal fluid.   Left Eye Quality was good. Scan locations included subfoveal. Central Foveal Thickness: 296. Progression has improved. Findings include no IRF, retinal drusen , subretinal hyper-reflective material, subretinal fluid.   Notes OD Minor epiretinal membrane, but improved as compared to onset November 2022  OS, subretinal hyper reflective material in the region much less active active CNVM as compared to  November 2022, fibrosis, improved post recent injection Avastin 1 weeks postinjection OS      Intravitreal Injection, Pharmacologic Agent - OD - Right Eye       Time Out 04/28/2022. 10:53 AM. Confirmed correct patient, procedure, site, and patient consented.   Anesthesia Topical anesthesia was used. Anesthetic medications included Lidocaine 4%.   Procedure Preparation included 5% betadine to ocular surface, 10% betadine to eyelids. A 30 gauge needle was used.   Injection: 1.25 mg Bevacizumab 1.'25mg'$ /0.86m   Route: Intravitreal, Site: Right Eye   NDC: [id number] Lot: [id number] Expiration date: 07/01/2022   Post-op Post injection exam found visual acuity of at least counting fingers. The patient tolerated the procedure well. There were no complications. The patient received written and verbal post procedure care education. Post injection medications included ocuflox.      Intravitreal Injection, Pharmacologic Agent - OS - Left Eye       Time Out 04/28/2022. 10:54 AM. Confirmed correct patient, procedure, site, and patient consented.   Anesthesia Topical anesthesia was used. Anesthetic medications included Lidocaine 4%.   Procedure Preparation included 5% betadine to ocular surface, 10% betadine to eyelids, Tobramycin 0.3%. A 30 gauge needle was used.   Injection: 1.25 mg Bevacizumab 1.'25mg'$ /0.072m  Route: Intravitreal, Site: Left Eye   NDC: [id number]: [id number] date: 07/01/2022   Post-op Post injection exam found visual acuity of at least counting fingers. The patient tolerated the procedure well. There were no complications. The patient received written and verbal post procedure care education. Post injection medications included ocuflox.              ASSESSMENT/PLAN:  Macular pucker, right eye Minor at present no impact on acuity  Nuclear sclerotic cataract of right eye ModerateThe  nature of cataract was discussed with the patient as well as  the elective nature of surgery. The patient was reassured that surgery at a later date does not put the patient at risk for a worse outcome. It was emphasized that the need for surgery is dictated by the patient's quality of life as influenced by the cataract. Patient was instructed to maintain close follow up with their general eye care doctor.  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[place]) Onset slightly less than 1 year before at 7-week interval no sign of recurrence posttreatment.  Repeat injection today and extend interval examination to 9 weeks  Exudative age-related macular degeneration of left eye with active choroidal neovascularization ([place]) Onset slightly less than 1 year prior, no recurrence of 7 weeks.  Repeat injection today and evaluate next 9 weeks  Posterior vitreous detachment of right eye Stable OD no holes or tears     ICD-10-CM   1. 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HCC)  H35.3211 Intravitreal Injection, Pharmacologic Agent - OD - Right Eye    Bevacizumab (AVASTIN) SOLN 1.25 mg    2. Exudative age-related macular degeneration of left eye with active choroidal neovascularization (HCC)  H35.3221 OCT, Retina - OU - Both Eyes    Intravitreal Injection, Pharmacologic Agent - OS - Left Eye    Bevacizumab (AVASTIN) SOLN 1.25 mg    3. Macular pucker, right eye  H35.371     4. Nuclear sclerotic cataract of right eye  H25.11     5. Posterior vitreous detachment of right eye  H43.811       1.  OD with moderate cataract we will continue to observe  2.  OU with wet AMD each reactivated disease threatening vision November 2022.  Currently at 7-week follow-up.  Repeat injection OU today reevaluate next in 9 weeks with extension of interval  3.  PVD and ERM OD minor at this time  Ophthalmic Meds Ordered this visit:  Meds ordered this encounter  Medications    Bevacizumab (AVASTIN) SOLN 1.25 mg   Bevacizumab (AVASTIN) SOLN 1.25 mg       Return in about 9 weeks (around 06/30/2022) for DILATE OU, AVASTIN OCT, OD, OS.  There are no Patient Instructions on file for this visit.   Explained the diagnoses, plan, and follow up with the patient and they expressed understanding.  Patient expressed understanding of the importance of proper follow up care.   Clent Demark Kenzy Campoverde M.D. Diseases & Surgery of the Retina and Vitreous [facility] Di[facility] [place] 04/28/22     Abbreviations: M myopia (nearsighted); A astigmatism; H hyperopia (farsighted); P presbyopia; Mrx spectacle prescription;  CTL contact lenses; OD right eye; OS left eye; OU both eyes  XT exotropia; ET esotropia; PEK punctate epithelial keratitis; PEE punctate epithelial erosions; DES dry eye syndrome; MGD meibomian gland dysfunction; ATs artificial tears; PFAT's preservative free artificial tears; [place] nuclear sclerotic cataract; PSC posterior subcapsular cataract; ERM epi-retinal membrane; PVD posterior vitreous detachment; RD retinal detachment; DM diabetes mellitus; DR diabetic retinopathy; NPDR non-proliferative diabetic retinopathy; PDR proliferative diabetic retinopathy; CSME clinically significant macular edema; DME diabetic macular edema; dbh dot blot hemorrhages; CWS cotton wool spot; POAG primary open angle glaucoma; C/D cup-to-disc ratio; HVF humphrey visual field; GVF goldmann visual field; OCT optical coherence tomography; IOP intraocular pressure; BRVO Branch retinal vein occlusion; CRVO central retinal vein occlusion; CRAO central retinal artery occlusion; BRAO branch retinal artery occlusion; RT  retinal tear; SB scleral buckle; PPV pars plana vitrectomy; VH Vitreous hemorrhage; PRP panretinal laser photocoagulation; IVK intravitreal kenalog; VMT vitreomacular traction; MH Macular hole;  NVD neovascularization of the disc; NVE neovascularization elsewhere; AREDS age related eye disease  study; ARMD age related macular degeneration; POAG primary open angle glaucoma; EBMD epithelial/anterior basement membrane dystrophy; ACIOL anterior chamber intraocular lens; IOL intraocular lens; PCIOL posterior chamber intraocular lens; Phaco/IOL phacoemulsification with intraocular lens placement; [place] photorefractive keratectomy; LASIK laser assisted in situ keratomileusis; HTN hypertension; DM diabetes mellitus; COPD chronic obstructive pulmonary disease

## 2022-05-28 ENCOUNTER — Ambulatory Visit: Payer: Medicare Other | Admitting: Pulmonary Disease

## 2022-05-28 ENCOUNTER — Encounter: Payer: Self-pay | Admitting: Pulmonary Disease

## 2022-05-28 ENCOUNTER — Ambulatory Visit (INDEPENDENT_AMBULATORY_CARE_PROVIDER_SITE_OTHER): Payer: Medicare Other

## 2022-05-28 VITALS — BP 126/64 | HR 75 | Wt 166.4 lb

## 2022-05-28 DIAGNOSIS — W19XXXS Unspecified fall, sequela: Secondary | ICD-10-CM

## 2022-05-28 DIAGNOSIS — J9611 Chronic respiratory failure with hypoxia: Secondary | ICD-10-CM

## 2022-05-28 DIAGNOSIS — J431 Panlobular emphysema: Secondary | ICD-10-CM

## 2022-05-28 DIAGNOSIS — R0789 Other chest pain: Secondary | ICD-10-CM | POA: Diagnosis not present

## 2022-05-28 MED ORDER — PREDNISONE 20 MG PO TABS: 20.0000 mg | ORAL_TABLET | Freq: Every day | ORAL | 0 refills | Status: AC

## 2022-05-28 MED ORDER — AMOXICILLIN-POT CLAVULANATE 875-125 MG PO TABS: 1.0000 | ORAL_TABLET | Freq: Two times a day (BID) | ORAL | 0 refills | Status: AC

## 2022-05-28 NOTE — Progress Notes (Signed)
$'@Patient'r$  ID: Frank Meyer, male    DOB: 1940/09/05, 81 y.o.   MRN: [id number]  Chief Complaint  Patient presents with   Follow-up    Follow up for chronic resp failure. Pt states that he feels like he is doing worse since last visit. Pt states that he thinks the anoro is not working well. Anoro is too expensive for him. Has not used anoro for about a month. Pt states when he walks he feels like passing out. He fell last week and thinks he cracked a rib.     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  Most recent neurology note reviewed.  Most recent cardiology note reviewed.  Unfortunately, his symptoms of worse over the last several months.  About 5 months ago his wife was concerned about a seizure.  I am not really clear on the events or what happened.  Regardless he was placed on seizure medication.  May 2023, after my last visit.  He was also unable to drive for at least 6 months.  As such, he is much less active.  Not playing golf.  Replete as he was planned twice a week.  He had gradual worsening of symptoms dyspnea on exertion.  He stopped his Anoro a month or so ago.  His wife felt it was too expensive.  He feels like he cannot afford it.  He is not sure how much it helps.  Unfortunate had a fall recently.  With heart right side, rib cage.  Not taking deep breaths.  Likely atelectasis.  His oxygen need is up slightly.  Now using 5 to 6 L with exertion.  Previously was using 5 with exertion.  HPI at initial visit:  Patient longstanding history of shortness of breath.  He is need oxygen for some years.  Uses up to 5 L with activity, 3-4 at rest.  His shortness of breath is described as severe.  Worse with exertion.  Somewhat relieved by rest over time.  No positional changes.  Does not seem to change with seasons, environment.  No other alleviating or  exacerbating factors.  No history or family history I should say of pulmonary hypertension.  He denies any connective tissue disease.  No arthralgias, arthritis.  Denies Raynaud's phenomenon.  Never taken diet supplements.  Does not use methamphetamine.  No congenital heart disease per his report.  Extensive review of medical record performed today.  Recent high-res CT chest with severe centrilobular and paraseptal emphysema on my review.  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  No evidence of ILD on my review.  TTE performed 11/2019.  This demonstrated mildly dilated LA, diastolic dysfunction, mild aortic regurgitation, mildly dilated RA, mildly enlarged RV with normal RV function, estimated elevated PASP.  Most recent PFTs 06/2020 demonstrated severely reduced DLCO 44% predicted with a surprisingly PFTs, TLC, no evidence of hyperinflation.  PMH: COPD, enlarged prostate, hyperlipidemia Surgical history: Reviewed in detail, he denies significant surgical history Family history: Memory loss in mother, denies significant pulmonary or respiratory issues and family Social history: Former smoker, 50-pack-year history, quit late 90s, retired   Licensed conveyancer / Pulmonary Flowsheets:   ACT:      No data to display           MMRC: mMRC Dyspnea Scale mMRC Score  09/26/2020 10:47 AM 0  Epworth:      No data to display           Tests:   FENO:  No results found for: "NITRICOXIDE"  PFT:    Latest Ref Rng & Units 06/26/2020    3:50 PM 04/27/2018    8:48 AM  PFT Results  FVC-Pre L 3.75  4.11   FVC-Predicted Pre % 111  119   FVC-Post L 3.85  4.07   FVC-Predicted Post % 113  118   Pre FEV1/FVC % % 78  79   Post FEV1/FCV % % 78  78   FEV1-Pre L 2.92  3.23   FEV1-Predicted Pre % 122  131   FEV1-Post L 3.02  3.20   DLCO uncorrected ml/min/mmHg 9.44  11.81   DLCO UNC% % 44  44   DLCO corrected ml/min/mmHg 9.61  11.92   DLCO COR  %Predicted % 44  44   DLVA Predicted % 45  49   TLC L 5.75  5.24   TLC % Predicted % 92  84   RV % Predicted % 87  51   Personally reviewed and interpreted as above  WALK:      No data to display           Imaging: Personally reviewed and as per EMR discussion of this note  Lab Results: Personally reviewed, notably eosinophils as high as 400 06/2020 CBC    Component Value Date/Time   WBC 11.2 (H) 11/06/2021 1209   RBC 4.52 11/06/2021 1209   HGB 12.6 (L) 11/06/2021 1209   HCT 39.9 11/06/2021 1209   PLT 237 11/06/2021 1209   MCV 88.3 11/06/2021 1209   MCH 27.9 11/06/2021 1209   MCHC 31.6 11/06/2021 1209   RDW 14.5 11/06/2021 1209   LYMPHSABS 2.1 11/06/2021 1209   MONOABS 0.9 11/06/2021 1209   EOSABS 0.4 11/06/2021 1209   BASOSABS 0.1 11/06/2021 1209    BMET    Component Value Date/Time   NA 135 11/06/2021 1209   NA 139 10/28/2020 0930   K 4.9 11/06/2021 1331   CL 103 11/06/2021 1209   CO2 25 11/06/2021 1209   GLUCOSE 108 (H) 11/06/2021 1209   BUN 15 11/06/2021 1209   BUN 15 10/28/2020 0930   CREATININE 1.33 (H) 11/06/2021 1209   CALCIUM 8.6 (L) 11/06/2021 1209   GFRNONAA 54 (L) 11/06/2021 1209   GFRAA 64 06/13/2020 0819    BNP No results found for: "BNP"  ProBNP    Component Value Date/Time   PROBNP 223 10/15/2020 1632    Specialty Problems       Pulmonary Problems   Allergic rhinitis   Dyspnea on exertion   Panlobular emphysema (HCC)   Right upper lobe pulmonary nodule   Hiatal hernia   Chronic respiratory failure (HCC)   Oxygen dependent    No Known Allergies  Immunization History  Administered Date(s) Administered   DTaP, 5 pertussis antigens 02/17/2016   Fluad Quad(high Dose 65+) 04/29/2019, 04/20/2020   Influenza Split 05/10/2012, 05/20/2013, 08/17/2013   Influenza, High Dose Seasonal PF 05/24/2017, 06/03/2018   Influenza-Unspecified 06/04/2018, 04/27/2019   PFIZER(Purple Top)SARS-COV-2 Vaccination 09/25/2019, 10/20/2019,  04/03/2020, 05/14/2020   Pneumococcal Conjugate-13 07/20/2014    Past Medical History:  Diagnosis Date   Allergic rhinitis 10/24/2009   Altered bowel habits 10/04/2020   Basal cell carcinoma    Benign prostatic hyperplasia with lower urinary tract symptoms 10/24/2009   Benign prostatic hypertrophy    Bradycardia 12/06/2019  Callosity 10/16/2013   Cardiomegaly 01/23/2015   Chronic diastolic heart failure ([place]) 12/19/2018   Chronic respiratory failure ([place]) 10/04/2020   Coronary artery calcification seen on CAT scan 05/28/2020   Dyslipidemia    Dyspnea on exertion 03/18/2018   Emphysema lung ([place]) 03/18/2018   Encounter for general adult medical examination without abnormal findings 01/17/2015   Ex-cigarette smoker 01/20/2021   Exudative age-related macular degeneration of left eye with active choroidal neovascularization ([place]) 12/18/2019   Gallbladder problem    Gastroesophageal reflux disease    Hiatal hernia    Hilar adenopathy 03/25/2018   History of smoking 03/18/2018   Hyperlipidemia 10/24/2009   Insomnia 05/09/2013   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   Internal hemorrhoid 10/04/2020   Macular pucker, right eye 04/30/2020   Mediastinal adenopathy 03/25/2018   Memory loss 05/09/2013   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   Mild memory disturbance    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   Noninfective gastroenteritis and colitis, unspecified 10/04/2020   Nuclear sclerotic cataract of right eye 12/18/2019   Other specified symptoms and signs involving the digestive system and abdomen 10/04/2020   Overweight 09/14/2018   Oxygen dependent 10/04/2020   Panlobular emphysema ([place]) 03/18/2018   Personal history of colonic polyps 10/04/2020   Posterior vitreous detachment of right eye 12/18/2019   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   Pulmonary hypertension ([place])    Right upper lobe pulmonary nodule 03/25/2018   Syncope and collapse  12/12/2018   TIA (transient ischemic attack)    Transient ischemic attack 10/24/2009   Weight decreased 10/04/2020    Tobacco History: Social History   Tobacco Use  Smoking Status Former   Packs/day: 2.00   Years: 25.00   Total pack years: 50.00   Types: Cigarettes   Quit date: 1994   Years since quitting: 29.7  Smokeless Tobacco Never   Counseling given: Not Answered   Continue to not smoke  Outpatient Encounter Medications as of 05/28/2022  Medication Sig   amoxicillin-clavulanate (AUGMENTIN) 875-125 MG tablet Take 1 tablet by mouth 2 (two) times daily for 7 days.   aspirin 81 MG EC tablet Take 1 tablet by mouth in the morning and at bedtime.   atorvastatin (LIPITOR) 20 MG tablet Take 20 mg by mouth at bedtime.   cholecalciferol (VITAMIN D) 1000 UNITS tablet Take 1,000 Units by mouth daily.   donepezil (ARICEPT) 10 MG tablet Take 1 tablet (10 mg total) by mouth daily.   fexofenadine (ALLEGRA) 180 MG tablet Take 180 mg by mouth daily.   finasteride (PROSCAR) 5 MG tablet Take 5 mg by mouth daily.   ipratropium (ATROVENT) 0.06 % nasal spray Place 2 sprays into both nostrils daily as needed for rhinitis.    levETIRAcetam (KEPPRA) 250 MG tablet Take 1 in the morning, take 2 at night   Melatonin 10 MG TABS Take 10 mg by mouth at bedtime.   omeprazole (PRILOSEC) 40 MG capsule Take 40 mg by mouth daily.    OXYGEN Inhale 5 L into the lungs as directed. 5lt during the day and 2lt at night   predniSONE (DELTASONE) 20 MG tablet Take 1 tablet (20 mg total) by mouth daily with breakfast for 5 days.   sacubitril-valsartan (ENTRESTO) 24-26 MG Take 1 tablet by mouth daily.   thiamine 100 MG tablet Take 1 tablet (100 mg total) by mouth daily.   traZODone (DESYREL) 100 MG tablet Take 2 tablets (200 mg total) by  mouth at bedtime.   nitroGLYCERIN (NITROSTAT) 0.4 MG SL tablet Place 1 tablet (0.4 mg total) under the tongue every 5 (five) minutes as needed. Chest pain (Patient taking differently:  Place 0.4 mg under the tongue every 5 (five) minutes as needed for chest pain. Chest pain)   [DISCONTINUED] umeclidinium-vilanterol (ANORO ELLIPTA) 62.5-25 MCG/ACT AEPB Inhale 1 puff into the lungs daily.   [DISCONTINUED] zaleplon (SONATA) 10 MG capsule Take 10 mg by mouth at bedtime as needed for sleep.   No facility-administered encounter medications on file as of 05/28/2022.     Review of Systems  Review of Systems  n/a Physical Exam  BP 126/64 (BP Location: Left Arm, Patient Position: Sitting, Cuff Size: Normal)   Pulse 75   Wt 166 lb 6.4 oz (75.5 kg)   SpO2 91% Comment: 4L pulsed POC  BMI 26.86 kg/m   Wt Readings from Last 5 Encounters:  05/28/22 166 lb 6.4 oz (75.5 kg)  02/03/22 161 lb 8 oz (73.3 kg)  01/08/22 160 lb (72.6 kg)  01/07/22 160 lb (72.6 kg)  12/03/21 155 lb (70.3 kg)    BMI Readings from Last 5 Encounters:  05/28/22 26.86 kg/m  02/03/22 26.07 kg/m  01/08/22 25.82 kg/m  01/07/22 25.44 kg/m  12/03/21 25.02 kg/m     Physical Exam General: Well-appearing, sitting in chair Eyes: EOMI, no icterus Neck: Supple, no JVP appreciated Respiratory: Diminished throughout, normal work of breathing, no wheeze Cardiovascular: Regular rhythm, no murmur MSK: Tender to palpation of her right rib cage    Assessment & Plan:   Dyspnea exertion: Likely multifactorial.  Related to emphysema, suspected dynamic gas trapping although none demonstrated on the recent pulmonary function test.  Worsening the summer 2023 in the setting of decreased activity after starting seizure medication and inability to drive, play golf etc.  Suspect largest contributor to worsening is deconditioning.  Encouraged him to try to increase activity at home.  Get out of the house and play golf as able.  Will take time to get back but I think he needs to increase his activity.  He has been off Anoro, this may be making things worse as well.  We will try low-dose Trelegy.  If not beneficial then  I would encourage him to resume his Anoro.  Of course, it is possible he has worsening interstitial lung disease.  Fall with right chest wall pain: Chest x-ray today.  Pulmonary hypertension, presumed: Based on TTE  11/2019.  No right heart cath in the past.  Suspect multifactorial.  Likely component of Group 2 disease given dilated LA, aortic valve insufficiency, diastolic dysfunction.  Likely largest component of group 3 disease given significant emphysema seen on CT as well as chronic hypoxemic respiratory failure in the setting of emphysema. VQ scan negative. No OSA, nocturnal hypoxemia present. TTE 09/2020 with improved now normal PASP but new development mildly reduced RV function. New mild reduced LV function as well. Repeat TTE 12/2020 with improved RV and LV function in setting of oxygen therapy and entresto per cardiology.  Recent decrease in [place] given concern for hypotension leading to syncope.  No further therapy at this time.   Emphysema: significant on CT scan. Spirometry WNL, no gas trapping, DLCO reduced. Albuterol not helpful.  Trial of Trelegy, if not beneficial resume Anoro.  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  Continue to monitor.  Will need to repeat a CT scan in the  coming months but would like for him to try to increase activity and recover from recent fall prior to this.  Chronic hypoxemic respiratory failure: Continue POC device 5 to 6 L with exertion.  Return in about 2 months (around 07/28/2022).   Lanier Clam, MD 05/28/2022  I spent 41 minutes in care of patient including review of records, face-to-face visit, coordination of care.

## 2022-05-28 NOTE — Patient Instructions (Signed)
It is so good to see you again  I am sorry about the recent fall and your worsening shortness of breath over the last few months.  I think the fall certainly has made things worse as your natural reaction to your body instinctively want to take shallow breaths.  This can lead to worsening oxygen numbers.  Lets get a chest x-ray today to make sure her look into the pain on the right side of your chest after the fall  To help prevent onset of pneumonia I am prescribing an antibiotic as well as prednisone.  Take as prescribed.  Lets try a new inhaler, this is very aggressive as well.  Use Trelegy 1 puff once daily.  This is similar to Anoro but adds an inhaled steroid to help decrease inflammation.  Rinse your mouth out with water after every use of the Trelegy.  If the Trelegy is helpful, please send me a message or call me and I will prescribe it.  If the Trelegy is not very helpful, I recommend resuming your Anoro once the Trelegy has run out  Return to clinic in 2 months or sooner as needed with Dr. Silas Flood

## 2022-05-29 ENCOUNTER — Telehealth: Payer: Self-pay | Admitting: Pulmonary Disease

## 2022-05-29 NOTE — Telephone Encounter (Signed)
The radiologist has not commented on that yet.  To me it looks relatively similar with signs of maybe some bruising of the lung on the right.  I do not see any obvious signs of fracture but we can see what the radiologist comments on.

## 2022-05-29 NOTE — Telephone Encounter (Signed)
Hello sir,  Can I get the results of this patients chest xray if they are ready.  Thank you

## 2022-05-29 NOTE — Telephone Encounter (Signed)
Called and spoke to patients wife about chest xray results. She verbalized understanding. Nothing further needed

## 2022-06-18 ENCOUNTER — Other Ambulatory Visit (HOSPITAL_COMMUNITY): Payer: Self-pay

## 2022-06-18 DIAGNOSIS — R1310 Dysphagia, unspecified: Secondary | ICD-10-CM

## 2022-06-24 ENCOUNTER — Ambulatory Visit (HOSPITAL_COMMUNITY)
Admission: RE | Admit: 2022-06-24 | Discharge: 2022-06-24 | Disposition: A | Discharge status: Home / Self Care | Payer: Medicare Other | Source: Ambulatory Visit | Attending: Internal Medicine | Admitting: Internal Medicine

## 2022-06-24 DIAGNOSIS — R1310 Dysphagia, unspecified: Secondary | ICD-10-CM

## 2022-06-24 DIAGNOSIS — R1314 Dysphagia, pharyngoesophageal phase: Secondary | ICD-10-CM

## 2022-06-24 NOTE — Therapy (Signed)
Modified Barium Swallow Progress Note  Patient Details  Name: Frank Meyer MRN: [id number] Date of Birth: [birth year]  Today's Date: 06/24/2022  Modified Barium Swallow completed.  Full report located under Chart Review in the Imaging Section.  Brief recommendations include the following:  Clinical Impression  Patient presents with a mild-moderate pharyngoesophageal dysphagia as per this MBS. Oral phase appeared [facility] for tested consistencies: thin liquids, nectar thick liquids, puree solids, mechanical soft solids. During pharyngeal phase, patient exhibited mildly delayed swallow initiation at level of the vallecular sinus with liquids and solids. Vallecular sinus residuals were present with all tested consistencies; with thin liquids vallecular residuals were trace to mild, nectar thick were mild, puree solids and mechanical soft solids were moderate. Cued dry swallows did help to clear residuals but full clearance of vallecular sinus residuals never occured. There was one instance of penetration to the cords (silent penetration) with thin liquids when patient taking a sip of thin while having moderate vallecular residuals from mechanical soft solids. No aspiration occurred during any phase of the swallow with any of the tested consistencies. During cervical esophageal phase, trace amount of retrograde movement of thin and nectar thick liquid barium observed but barium stayed within cervical esophagus. SLP recommending Dys 3 (mechanical soft) solids, thin liquids and meds crushed in puree or in liquid form.   Swallow Evaluation Recommendations       SLP Diet Recommendations: Dysphagia 3 (Mech soft) solids;Thin liquid   Liquid Administration via: Straw;Cup   Medication Administration: Crushed with puree           Postural Changes: Seated upright at 90 degrees;Remain semi-upright after after feeds/meals (Comment) (30 minutes)           Sonia Baller, MA, CCC-SLP Speech  Therapy

## 2022-07-01 ENCOUNTER — Encounter (INDEPENDENT_AMBULATORY_CARE_PROVIDER_SITE_OTHER): Payer: Medicare Other | Admitting: Ophthalmology

## 2022-07-01 ENCOUNTER — Encounter (INDEPENDENT_AMBULATORY_CARE_PROVIDER_SITE_OTHER): Payer: Self-pay

## 2022-07-20 ENCOUNTER — Emergency Department (HOSPITAL_BASED_OUTPATIENT_CLINIC_OR_DEPARTMENT_OTHER): Payer: Medicare Other

## 2022-07-20 ENCOUNTER — Encounter (HOSPITAL_BASED_OUTPATIENT_CLINIC_OR_DEPARTMENT_OTHER): Payer: Self-pay | Admitting: *Deleted

## 2022-07-20 ENCOUNTER — Encounter (HOSPITAL_COMMUNITY): Payer: Self-pay

## 2022-07-20 ENCOUNTER — Other Ambulatory Visit: Payer: Self-pay

## 2022-07-20 ENCOUNTER — Observation Stay (HOSPITAL_BASED_OUTPATIENT_CLINIC_OR_DEPARTMENT_OTHER)
Admission: EM | Admit: 2022-07-20 | Discharge: 2022-07-22 | Disposition: A | Discharge status: Home / Self Care | Payer: Medicare Other | Attending: Family Medicine | Admitting: Family Medicine

## 2022-07-20 DIAGNOSIS — Z87891 Personal history of nicotine dependence: Secondary | ICD-10-CM | POA: Insufficient documentation

## 2022-07-20 DIAGNOSIS — Z1152 Encounter for screening for COVID-19: Secondary | ICD-10-CM | POA: Insufficient documentation

## 2022-07-20 DIAGNOSIS — J690 Pneumonitis due to inhalation of food and vomit: Principal | ICD-10-CM | POA: Insufficient documentation

## 2022-07-20 DIAGNOSIS — I5032 Chronic diastolic (congestive) heart failure: Secondary | ICD-10-CM | POA: Diagnosis not present

## 2022-07-20 DIAGNOSIS — J189 Pneumonia, unspecified organism: Secondary | ICD-10-CM

## 2022-07-20 DIAGNOSIS — J449 Chronic obstructive pulmonary disease, unspecified: Secondary | ICD-10-CM | POA: Diagnosis not present

## 2022-07-20 DIAGNOSIS — R569 Unspecified convulsions: Secondary | ICD-10-CM

## 2022-07-20 DIAGNOSIS — J441 Chronic obstructive pulmonary disease with (acute) exacerbation: Secondary | ICD-10-CM | POA: Diagnosis present

## 2022-07-20 DIAGNOSIS — R0602 Shortness of breath: Secondary | ICD-10-CM | POA: Diagnosis present

## 2022-07-20 DIAGNOSIS — J9611 Chronic respiratory failure with hypoxia: Secondary | ICD-10-CM | POA: Diagnosis present

## 2022-07-20 DIAGNOSIS — F0390 Unspecified dementia without behavioral disturbance: Secondary | ICD-10-CM | POA: Diagnosis not present

## 2022-07-20 DIAGNOSIS — R1310 Dysphagia, unspecified: Secondary | ICD-10-CM | POA: Diagnosis not present

## 2022-07-20 DIAGNOSIS — Z7982 Long term (current) use of aspirin: Secondary | ICD-10-CM | POA: Diagnosis not present

## 2022-07-20 DIAGNOSIS — Z8673 Personal history of transient ischemic attack (TIA), and cerebral infarction without residual deficits: Secondary | ICD-10-CM | POA: Insufficient documentation

## 2022-07-20 DIAGNOSIS — K219 Gastro-esophageal reflux disease without esophagitis: Secondary | ICD-10-CM

## 2022-07-20 DIAGNOSIS — Z79899 Other long term (current) drug therapy: Secondary | ICD-10-CM | POA: Insufficient documentation

## 2022-07-20 DIAGNOSIS — Z85828 Personal history of other malignant neoplasm of skin: Secondary | ICD-10-CM | POA: Insufficient documentation

## 2022-07-20 DIAGNOSIS — D72829 Elevated white blood cell count, unspecified: Secondary | ICD-10-CM | POA: Diagnosis not present

## 2022-07-20 DIAGNOSIS — N400 Enlarged prostate without lower urinary tract symptoms: Secondary | ICD-10-CM | POA: Diagnosis present

## 2022-07-20 DIAGNOSIS — N401 Enlarged prostate with lower urinary tract symptoms: Secondary | ICD-10-CM | POA: Diagnosis present

## 2022-07-20 DIAGNOSIS — R413 Other amnesia: Secondary | ICD-10-CM | POA: Diagnosis present

## 2022-07-20 DIAGNOSIS — R0902 Hypoxemia: Secondary | ICD-10-CM

## 2022-07-20 HISTORY — DX: Unspecified macular degeneration: H35.30

## 2022-07-20 LAB — I-STAT ARTERIAL BLOOD GAS, ED
Acid-base deficit: 1 mmol/L (ref 0.0–2.0)
Bicarbonate: 23.1 mmol/L (ref 20.0–28.0)
Calcium, Ion: 1.22 mmol/L (ref 1.15–1.40)
HCT: 40 % (ref 39.0–52.0)
Hemoglobin: 13.6 g/dL (ref 13.0–17.0)
O2 Saturation: 95 %
Potassium: 4.6 mmol/L (ref 3.5–5.1)
Sodium: 137 mmol/L (ref 135–145)
TCO2: 24 mmol/L (ref 22–32)
pCO2 arterial: 37 mmHg (ref 32–48)
pH, Arterial: 7.402 (ref 7.35–7.45)
pO2, Arterial: 75 mmHg — ABNORMAL LOW (ref 83–108)

## 2022-07-20 LAB — BASIC METABOLIC PANEL
Anion gap: 7 (ref 5–15)
BUN: 22 mg/dL (ref 8–23)
CO2: 26 mmol/L (ref 22–32)
Calcium: 8.7 mg/dL — ABNORMAL LOW (ref 8.9–10.3)
Chloride: 104 mmol/L (ref 98–111)
Creatinine, Ser: 1.45 mg/dL — ABNORMAL HIGH (ref 0.61–1.24)
GFR, Estimated: 48 mL/min — ABNORMAL LOW (ref >60)
Glucose, Bld: 122 mg/dL — ABNORMAL HIGH (ref 70–99)
Potassium: 4.7 mmol/L (ref 3.5–5.1)
Sodium: 137 mmol/L (ref 135–145)

## 2022-07-20 LAB — RESP PANEL BY RT-PCR (FLU A&B, COVID) ARPGX2
Influenza A by PCR: NEGATIVE
Influenza B by PCR: NEGATIVE
SARS Coronavirus 2 by RT PCR: NEGATIVE

## 2022-07-20 LAB — CBC WITH DIFFERENTIAL/PLATELET
Abs Immature Granulocytes: 0.06 10*3/uL (ref 0.00–0.07)
Basophils Absolute: 0.1 10*3/uL (ref 0.0–0.1)
Basophils Relative: 1 %
Eosinophils Absolute: 0.3 10*3/uL (ref 0.0–0.5)
Eosinophils Relative: 3 %
HCT: 41.8 % (ref 39.0–52.0)
Hemoglobin: 13.1 g/dL (ref 13.0–17.0)
Immature Granulocytes: 1 %
Lymphocytes Relative: 10 %
Lymphs Abs: 1.2 10*3/uL (ref 0.7–4.0)
MCH: 26.6 pg (ref 26.0–34.0)
MCHC: 31.3 g/dL (ref 30.0–36.0)
MCV: 85 fL (ref 80.0–100.0)
Monocytes Absolute: 0.8 10*3/uL (ref 0.1–1.0)
Monocytes Relative: 7 %
Neutro Abs: 10 10*3/uL — ABNORMAL HIGH (ref 1.7–7.7)
Neutrophils Relative %: 78 %
Platelets: 245 10*3/uL (ref 150–400)
RBC: 4.92 MIL/uL (ref 4.22–5.81)
RDW: 14.4 % (ref 11.5–15.5)
WBC: 12.5 10*3/uL — ABNORMAL HIGH (ref 4.0–10.5)
nRBC: 0 % (ref 0.0–0.2)

## 2022-07-20 MED ORDER — TRAZODONE HCL 100 MG PO TABS
200.0000 mg | ORAL_TABLET | Freq: Every day | ORAL | Status: DC
  Administered 2022-07-20 – 2022-07-21 (×2): 200 mg via ORAL
  Filled 2022-07-20: qty 4
  Filled 2022-07-20: qty 2

## 2022-07-20 MED ORDER — SACUBITRIL-VALSARTAN 24-26 MG PO TABS
1.0000 | ORAL_TABLET | Freq: Every day | ORAL | Status: DC
  Administered 2022-07-21 – 2022-07-22 (×2): 1 via ORAL
  Filled 2022-07-20 (×3): qty 1

## 2022-07-20 MED ORDER — METHYLPREDNISOLONE SODIUM SUCC 125 MG IJ SOLR
125.0000 mg | Freq: Once | INTRAMUSCULAR | Status: AC
  Administered 2022-07-20: 125 mg via INTRAVENOUS
  Filled 2022-07-20: qty 2

## 2022-07-20 MED ORDER — LEVETIRACETAM 250 MG PO TABS
250.0000 mg | ORAL_TABLET | Freq: Every day | ORAL | Status: DC
  Administered 2022-07-21 – 2022-07-22 (×2): 250 mg via ORAL
  Filled 2022-07-20 (×3): qty 1

## 2022-07-20 MED ORDER — LEVETIRACETAM 500 MG PO TABS
500.0000 mg | ORAL_TABLET | Freq: Every day | ORAL | Status: DC
  Administered 2022-07-20 – 2022-07-21 (×2): 500 mg via ORAL
  Filled 2022-07-20 (×2): qty 1

## 2022-07-20 MED ORDER — DOXYCYCLINE HYCLATE 100 MG PO TABS
100.0000 mg | ORAL_TABLET | Freq: Once | ORAL | Status: AC
  Administered 2022-07-20: 100 mg via ORAL
  Filled 2022-07-20: qty 1

## 2022-07-20 MED ORDER — SODIUM CHLORIDE 0.9 % IV SOLN
1.0000 g | Freq: Once | INTRAVENOUS | Status: AC
  Administered 2022-07-20: 1 g via INTRAVENOUS
  Filled 2022-07-20: qty 10

## 2022-07-20 MED ORDER — IOHEXOL 350 MG/ML SOLN
75.0000 mL | Freq: Once | INTRAVENOUS | Status: AC | PRN
  Administered 2022-07-20: 75 mL via INTRAVENOUS

## 2022-07-20 MED ORDER — ALBUTEROL SULFATE (2.5 MG/3ML) 0.083% IN NEBU: 2.5000 mg | INHALATION_SOLUTION | RESPIRATORY_TRACT | Status: DC | PRN

## 2022-07-20 MED ORDER — ASPIRIN 81 MG PO TBEC
81.0000 mg | DELAYED_RELEASE_TABLET | Freq: Every day | ORAL | Status: DC
  Administered 2022-07-21 – 2022-07-22 (×2): 81 mg via ORAL
  Filled 2022-07-20 (×2): qty 1

## 2022-07-20 MED ORDER — IPRATROPIUM-ALBUTEROL 0.5-2.5 (3) MG/3ML IN SOLN
3.0000 mL | RESPIRATORY_TRACT | Status: DC | PRN
  Administered 2022-07-20 (×3): 3 mL via RESPIRATORY_TRACT
  Filled 2022-07-20: qty 9

## 2022-07-20 MED ORDER — IPRATROPIUM-ALBUTEROL 0.5-2.5 (3) MG/3ML IN SOLN
3.0000 mL | Freq: Four times a day (QID) | RESPIRATORY_TRACT | Status: DC
  Administered 2022-07-20 – 2022-07-21 (×4): 3 mL via RESPIRATORY_TRACT
  Filled 2022-07-20 (×4): qty 3

## 2022-07-20 MED ORDER — FINASTERIDE 5 MG PO TABS
5.0000 mg | ORAL_TABLET | Freq: Every day | ORAL | Status: DC
  Administered 2022-07-21 – 2022-07-22 (×2): 5 mg via ORAL
  Filled 2022-07-20 (×3): qty 1

## 2022-07-20 MED ORDER — PANTOPRAZOLE SODIUM 40 MG PO TBEC
40.0000 mg | DELAYED_RELEASE_TABLET | Freq: Every day | ORAL | Status: DC
  Administered 2022-07-21 – 2022-07-22 (×2): 40 mg via ORAL
  Filled 2022-07-20 (×2): qty 1

## 2022-07-20 MED ORDER — ATORVASTATIN CALCIUM 10 MG PO TABS
20.0000 mg | ORAL_TABLET | Freq: Every day | ORAL | Status: DC
  Administered 2022-07-20 – 2022-07-21 (×2): 20 mg via ORAL
  Filled 2022-07-20 (×2): qty 2

## 2022-07-20 MED ORDER — MELATONIN 5 MG PO TABS
10.0000 mg | ORAL_TABLET | Freq: Every day | ORAL | Status: DC
  Administered 2022-07-21: 10 mg via ORAL
  Filled 2022-07-20 (×2): qty 2

## 2022-07-20 MED ORDER — DONEPEZIL HCL 10 MG PO TABS
10.0000 mg | ORAL_TABLET | Freq: Every day | ORAL | Status: DC
  Administered 2022-07-21 – 2022-07-22 (×2): 10 mg via ORAL
  Filled 2022-07-20 (×3): qty 1

## 2022-07-20 MED ORDER — THIAMINE MONONITRATE 100 MG PO TABS
100.0000 mg | ORAL_TABLET | Freq: Every day | ORAL | Status: DC
  Administered 2022-07-21 – 2022-07-22 (×2): 100 mg via ORAL
  Filled 2022-07-20 (×2): qty 1

## 2022-07-20 NOTE — ED Triage Notes (Signed)
Pt BIB neighbor, states he was trying to walk his dog but was having trouble walking to the top of his driveway. Neighbor reports O2 levels in 60's on return to home "for awhile." Reports inconsistent O2 levels Pulmonologist appt tomorrow

## 2022-07-20 NOTE — ED Notes (Signed)
Patient denies pain and is resting comfortably.  

## 2022-07-20 NOTE — ED Notes (Signed)
Pt. Has neighbor who looks after the Pt. Somewhat and brought Pt. And said he was going up the drive way today and he collapsed not falling but unable to regain his walking ability to the house.  Pt. Sats would not go back up today so she brought the Pt. In to the ER.

## 2022-07-20 NOTE — Progress Notes (Signed)
Patient is an 81 year old male  H/x of COPD on 5-6 lpm, CHF. Brought to the ED at [facility] by his neighbor with dyspnea on exertion, O2 sat down to 60%  O2 sat in 60s. On ambulation, down to 60s, CT chest - possible early pneumonia as well as new pulmonary nodules. Given:  IV Solumedrol, Rocephin, doxycycline  Follows up with pulmonologist Dr. Silas Flood.  Apparently has an appointment tomorrow.  But with acute symptoms, needs overnight observation in the hospital. Telemetry bed ordered.

## 2022-07-20 NOTE — ED Notes (Signed)
Patient requesting dinner. Patient given meatloaf/mashed potatoes meal. Patient repositioned for comfort. Denies any other needs at present.

## 2022-07-20 NOTE — ED Notes (Signed)
Fine Crackles on R per Resp. Donnie Coffin. RT

## 2022-07-20 NOTE — ED Notes (Signed)
Pt. Is up walking with Resp. Donnie Coffin. RT at present time.

## 2022-07-20 NOTE — ED Notes (Signed)
Ambulated on 6lpm O2.  SpO2 dropped from 92-81% with exertion, denies increased WOB.  Once back in room SpO2 dropped to its lowest of 66%.  SpO2 gradually increased back to 96% after several minutes.

## 2022-07-20 NOTE — ED Provider Notes (Signed)
[facility] DEPART[facility] Provider Note   CSN: [id number] Arrival date & time: 07/20/22  1050     History  Chief Complaint  Patient presents with   Shortness of Breath   hypoxia    Frank Meyer is a 81 y.o. male.  HPI 81 year old male with a complicated medical history, including chronic respiratory failure on 6 L, severe emphysema, pulmonary nodules, TIA, pulmonary hypertension, CHF with an EF of 50 to 55% as of January of this year who presents to the ER with complaints of desaturation.  Patient reports chronic shortness of breath with ambulation and even putting on close.  He states that he went to walk the dog this morning and noticed that his oxygen saturations were in the low 60s and did not come up despite increasing his home O2 at 6 L.  He normally states that he wears 5 L nasal cannula at home and sometimes will need to increase it to 6 L with activity.  He denies any fevers, chills, cough, shortness of breath.  He arrives in no respiratory distress.  He denies any lower extremity edema.    Home Medications Prior to Admission medications   Medication Sig Start Date End Date Taking? Authorizing Provider  aspirin 81 MG EC tablet Take 1 tablet by mouth in the morning and at bedtime.    [provider]  atorvastatin (LIPITOR) 20 MG tablet Take 20 mg by mouth at bedtime.    [provider]  cholecalciferol (VITAMIN D) 1000 UNITS tablet Take 1,000 Units by mouth daily.    [provider]  donepezil (ARICEPT) 10 MG tablet Take 1 tablet (10 mg total) by mouth daily. 02/03/22   Suzzanne Cloud, NP  fexofenadine (ALLEGRA) 180 MG tablet Take 180 mg by mouth daily.    [provider]  finasteride (PROSCAR) 5 MG tablet Take 5 mg by mouth daily.    [provider]  ipratropium (ATROVENT) 0.06 % nasal spray Place 2 sprays into both nostrils daily as needed for rhinitis.  02/09/13   [provider]  levETIRAcetam  (KEPPRA) 250 MG tablet Take 1 in the morning, take 2 at night 02/03/22   Suzzanne Cloud, NP  Melatonin 10 MG TABS Take 10 mg by mouth at bedtime.    [provider]  nitroGLYCERIN (NITROSTAT) 0.4 MG SL tablet Place 1 tablet (0.4 mg total) under the tongue every 5 (five) minutes as needed. Chest pain Patient taking differently: Place 0.4 mg under the tongue every 5 (five) minutes as needed for chest pain. Chest pain 07/15/21 01/07/22  Park Liter, MD  omeprazole (PRILOSEC) 40 MG capsule Take 40 mg by mouth daily.  03/29/13   [provider]  OXYGEN Inhale 5 L into the lungs as directed. 5lt during the day and 2lt at night    [provider]  sacubitril-valsartan (ENTRESTO) 24-26 MG Take 1 tablet by mouth daily. 02/20/22   Park Liter, MD  thiamine 100 MG tablet Take 1 tablet (100 mg total) by mouth daily. 12/13/18   Geradine Girt, DO  traZODone (DESYREL) 100 MG tablet Take 2 tablets (200 mg total) by mouth at bedtime. 02/03/22   Suzzanne Cloud, NP      Allergies    Patient has no known allergies.    Review of Systems   Review of Systems Ten systems reviewed and are negative for acute change, except as noted in the HPI.   Physical Exam  Updated Vital Signs BP 119/72   Pulse 76   Temp 98.2 F (36.8 C)   Resp (!) 26   SpO2 93%  Physical Exam Vitals and nursing note reviewed.  Constitutional:      General: He is not in acute distress.    Appearance: He is well-developed.  HENT:     Head: Normocephalic and atraumatic.  Eyes:     Conjunctiva/sclera: Conjunctivae normal.  Cardiovascular:     Rate and Rhythm: Normal rate and regular rhythm.     Heart sounds: No murmur heard. Pulmonary:     Effort: Pulmonary effort is normal. No respiratory distress.     Breath sounds: Decreased breath sounds present. No wheezing.     Comments: Decreased breath sounds throughout Chest:     Chest wall: No tenderness.  Abdominal:     Palpations: Abdomen is soft.      Tenderness: There is no abdominal tenderness.  Musculoskeletal:        General: No swelling.     Cervical back: Neck supple.     Right lower leg: No edema.     Left lower leg: No edema.  Skin:    General: Skin is warm and dry.     Capillary Refill: Capillary refill takes less than 2 seconds.  Neurological:     General: No focal deficit present.     Mental Status: He is alert.  Psychiatric:        Mood and Affect: Mood normal.     ED Results / Procedures / Treatments   Labs (all labs ordered are listed, but only abnormal results are displayed) Labs Reviewed  CBC WITH DIFFERENTIAL/PLATELET - Abnormal; Notable for the following components:      Result Value   WBC 12.5 (*)    Neutro Abs 10.0 (*)    All other components within normal limits  BASIC METABOLIC PANEL - Abnormal; Notable for the following components:   Glucose, Bld 122 (*)    Creatinine, Ser 1.45 (*)    Calcium 8.7 (*)    GFR, Estimated 48 (*)    All other components within normal limits  I-STAT ARTERIAL BLOOD GAS, ED - Abnormal; Notable for the following components:   pO2, Arterial 75 (*)    All other components within normal limits  RESP PANEL BY RT-PCR (FLU A&B, COVID) [id number]    EKG EKG Interpretation  Date/Time:  Monday July 20 2022 11:27:22 EST Ventricular Rate:  97 PR Interval:  166 QRS Duration: 82 QT Interval:  334 QTC Calculation: 425 R Axis:   8 Text Interpretation: Sinus rhythm Probable left atrial enlargement Abnormal R-wave progression, late transition Nonspecific T abnrm, anterolateral leads No acute changes No significant change since last tracing Confirmed by Varney Biles [phone]) on 07/20/2022 12:26:41 PM  Radiology CT Angio Chest PE W and/or Wo Contrast  Result Date: 07/20/2022 CLINICAL DATA:  hypoxia EXAM: CT ANGIOGRAPHY CHEST WITH CONTRAST TECHNIQUE: Multidetector CT imaging of the chest was performed using the standard protocol during bolus administration of intravenous  contrast. Multiplanar CT image reconstructions and MIPs were obtained to evaluate the vascular anatomy. RADIATION DOSE REDUCTION: This exam was performed according to the departmental dose-optimization program which includes automated exposure control, adjustment of the mA and/or kV according to patient size and/or use of iterative reconstruction technique. CONTRAST:  71m OMNIPAQUE IOHEXOL 350 MG/ML SOLN COMPARISON:  None Available. FINDINGS: Cardiovascular: Satisfactory opacification of the pulmonary arteries to the segmental level. No evidence of pulmonary embolism. Artifact along  the right upper lobe segmental pulmonary arteries (5:92, 8:74) With no definite filling defect to suggest true pulmonary embolus. Normal heart size. No significant pericardial effusion. The thoracic aorta is normal in caliber. Mild atherosclerotic plaque of the thoracic aorta. At least 2 vessel coronary artery calcifications. Prominent but nonenlarged mediastinal Mediastinum/Nodes: Lymph nodes. There is an enlarged 1.8 cm right hilar lymph node (5:118). No enlarged mediastinal or axillary lymph nodes. Thyroid gland, trachea, and esophagus demonstrate no significant findings. Small hiatal hernia. Lungs/Pleura: Paraseptal and centrilobular moderate severe emphysematous changes. Interval development of ground-glass and consolidative airspace opacities of the bilateral lower lobes. Similar patchy consolidative findings of the right upper lobe. Persistent pleural/pulmonary scarring at the right apex. Interval development of a nodular-like airspace opacity of the lingula measuring 1.6 x 1.4 cm (6:48, 7:65). Interval increase in size of a right upper lobe 1.4 x 1.4 cm (from 1 x 1.2 cm) pulmonary nodule (6:22, 7:66). . No pulmonary mass. Bilateral trace pleural effusions. No pneumothorax. Upper Abdomen: No acute abnormality. Sing enhancing 0.8 cm lesion within the right posterior hepatic lobe likely representing a flash filling hemangioma.  Surgical clips noted within the upper abdomen. Musculoskeletal: No chest wall abnormality. No suspicious lytic or blastic osseous lesions. No acute displaced fracture. Multilevel at least mild degenerative changes of the spine. T2 vertebral body hemangioma. Review of the MIP images confirms the above findings. IMPRESSION: 1. Developing infection/inflammation of bilateral lower lobes and right upper lobe superimposed on a background of emphysema. Associated bilateral trace pleural effusions. 2. Interval increase in size of a right upper lobe 1.4 x 1.4 cm (from 1 x 1.2 cm) pulmonary nodule. Additional imaging evaluation or consultation with Pulmonology or Thoracic Surgery recommended. 3. Interval development of a Nodular-like airspace opacity of the lingula measuring 1.6 x 1.4 cm. Consider one of the following in 3 months for both low-risk and high-risk individuals: (a) repeat chest CT, (b) follow-up PET-CT, or (c) tissue sampling. This recommendation follows the consensus statement: Guidelines for Management of Incidental Pulmonary Nodules Detected on CT Images: From the Fleischner Society 2017; Radiology 2017; 284:228-243. 4. Right hilar lymphadenopathy and prominent but nonenlarged mediastinal lymph nodes likely reactive in etiology. Recommend attention on follow-up. 5. Small hiatal hernia. Electronically Signed   By: Iven Finn M.D.   On: 07/20/2022 14:53   DG Chest Portable 1 View  Result Date: 07/20/2022 CLINICAL DATA:  Low oxygen saturations EXAM: PORTABLE CHEST 1 VIEW COMPARISON:  Chest radiograph 05/28/2022 and CT chest 06/27/2021. FINDINGS: The cardiomediastinal silhouette is grossly stable allowing for leftward patient rotation on the current study. There are peripheral reticular opacities in both lung bases which are overall stable from the prior study consistent with underlying fibrotic lung disease. There is no convincing acute airspace opacity. There is no pulmonary edema. There is no  significant pleural effusion. There is no appreciable pneumothorax There is no displaced rib fracture or other acute osseous abnormality. IMPRESSION: Peripheral reticular opacities in both lungs consistent with underlying chronic fibrotic lung disease. No convincing acute airspace opacity. Electronically Signed   By: Valetta Mole M.D.   On: 07/20/2022 12:27    Procedures Procedures    Medications Ordered in ED Medications  ipratropium-albuterol (DUONEB) 0.5-2.5 (3) MG/3ML nebulizer solution 3 mL (3 mLs Nebulization Given 07/20/22 1152)  cefTRIAXone (ROCEPHIN) 1 g in sodium chloride 0.9 % 100 mL IVPB (1 g Intravenous New Bag/Given 07/20/22 1559)  methylPREDNISolone sodium succinate (SOLU-MEDROL) 125 mg/2 mL injection 125 mg (125 mg Intravenous Given 07/20/22 1215)  iohexol (OMNIPAQUE) 350 MG/ML injection 75 mL (75 mLs Intravenous Contrast Given 07/20/22 1425)  doxycycline (VIBRA-TABS) tablet 100 mg (100 mg Oral Given 07/20/22 1554)    ED Course/ Medical Decision Making/ A&P                           Medical Decision Making Amount and/or Complexity of Data Reviewed Labs: ordered. Radiology: ordered.  Risk Prescription drug management. Decision regarding hospitalization.   This patient complains of hypoxia, this involves an extensive number of treatment options, and is a complaint that carries with it a high risk of complications and morbidity.  The differential diagnosis includes COPD exacerbation, pneumonia, PE, CHF exacerbation, pneumothorax  I Ordered, reviewed, and interpreted labs, which included  -CBC with mild leukocytosis of 12.5 -BMP without any significant electrode abnormalities, creatinine of 1.45, slightly elevated from baseline -COVID and flu are negative -ABG with pH of 7.4, pCO2 37, pO2 75, bicarb 23.1 I ordered medication DuoNeb and Solu-Medrol for COPD exacerbation I ordered imaging studies which included chest x-ray and I independently visualized and interpreted  imaging which showed  - MPRESSION:  Peripheral reticular opacities in both lungs consistent with  underlying chronic fibrotic lung disease. No convincing acute  airspace opacity.   Additional history obtained from  neighbor and wife at bedside Previous records obtained and reviewed, followed by pulmonology, with worsening pulmonary function at baseline over the last several months  Patient presented with worsening hypoxia.  Presented with sats at 60% on home O2.  He was placed on our nasal cannula and continued to have desaturations down to the mid 70s, placed on a nonrebreather.  He eventually was able to be weaned back to his normal 5 L high flow nasal cannula.  He received a DuoNeb treatment with some relief.  He did not arrive in any respiratory distress and was speaking in full sentences.  Does not appear to be clinically volume overloaded.  After chest x-ray and DuoNebs he was ambulated and sats still dropped to the low 80s and dropped even as low as 66% on arrival back to the ER room, though the patient did end up recovering.  He is still on his home 5 L nasal cannula.  After further discussion with the patient and his neighbor at bedside, I did obtain a CT chest PE study to rule out PE or underlying infection not seen on chest x-ray.  I have reviewed this, agree with radiology read, results as below  IMPRESSION:  1. Developing infection/inflammation of bilateral lower lobes and  right upper lobe superimposed on a background of emphysema.  Associated bilateral trace pleural effusions.  2. Interval increase in size of a right upper lobe 1.4 x 1.4 cm  (from 1 x 1.2 cm) pulmonary nodule. Additional imaging evaluation or  consultation with Pulmonology or Thoracic Surgery recommended.  3. Interval development of a Nodular-like airspace opacity of the  lingula measuring 1.6 x 1.4 cm. Consider one of the following in 3  months for both low-risk and high-risk individuals: (a) repeat chest  CT,  (b) follow-up PET-CT, or (c) tissue sampling. This  recommendation follows the consensus statement: Guidelines for  Management of Incidental Pulmonary Nodules Detected on CT Images:  From the Fleischner Society 2017; Radiology 2017; 284:228-243.  4. Right hilar lymphadenopathy and prominent but nonenlarged  mediastinal lymph nodes likely reactive in etiology. Recommend  attention on follow-up.  5. Small hiatal hernia.   Discussed possible early  pneumonia seen on PE study as well as the increase in size of the wrap upper pulmonary nodule.  Patient initially hesitant to admission, however after discussion with him and his wife, he is agreeable for IV antibiotics, admission and close observation.  Feel symptoms are likely due to early infection/COPD exacerbation, less likely heart failure exacerbation given no evidence of volume overload.  Antibiotics consulted hospitalist for admission.  Poke with the hospitalist team who will admit the patient further evaluation and treatment  This was a shared visit with my supervising physician Dr. Kathrynn Humble who independently saw and evaluated the patient & provided guidance in evaluation/management/disposition ,in agreement with care  Final Clinical Impression(s) / ED Diagnoses Final diagnoses:  Hypoxia  Community acquired pneumonia, unspecified laterality    Rx / DC Orders ED Discharge Orders     None         Lyndel Safe 07/20/22 1616    Varney Biles, MD 07/24/22 [phone]

## 2022-07-20 NOTE — ED Notes (Signed)
Increased [place] O2 to 6L [place] due to oxygen saturation being 88% upon entering room. Physician notified. Patient oxygen saturation increased to 91%.

## 2022-07-20 NOTE — ED Notes (Signed)
Patient stood up to use urinal and change into his pajama pants. Patient's oxygen decreased to 79% while remaining on his oxygen. Patient was assisted and repositioned in stretcher. Oxygen sat is currently 96% at rest.

## 2022-07-20 NOTE — ED Notes (Signed)
Pt. Has an old scratch on his R ear from a fall several days or weeks ago with no open wounds.

## 2022-07-20 NOTE — ED Notes (Signed)
Pt O2 sat highest 67% on 6L [place]; charge RN notified, pt roomed immediately

## 2022-07-21 ENCOUNTER — Ambulatory Visit: Payer: Medicare Other | Admitting: Pulmonary Disease

## 2022-07-21 DIAGNOSIS — J690 Pneumonitis due to inhalation of food and vomit: Secondary | ICD-10-CM | POA: Diagnosis not present

## 2022-07-21 DIAGNOSIS — J449 Chronic obstructive pulmonary disease, unspecified: Secondary | ICD-10-CM

## 2022-07-21 DIAGNOSIS — Z87891 Personal history of nicotine dependence: Secondary | ICD-10-CM | POA: Diagnosis not present

## 2022-07-21 DIAGNOSIS — Z7982 Long term (current) use of aspirin: Secondary | ICD-10-CM | POA: Diagnosis not present

## 2022-07-21 DIAGNOSIS — Z79899 Other long term (current) drug therapy: Secondary | ICD-10-CM | POA: Diagnosis not present

## 2022-07-21 DIAGNOSIS — R1310 Dysphagia, unspecified: Secondary | ICD-10-CM

## 2022-07-21 DIAGNOSIS — Z8673 Personal history of transient ischemic attack (TIA), and cerebral infarction without residual deficits: Secondary | ICD-10-CM | POA: Diagnosis not present

## 2022-07-21 DIAGNOSIS — I5032 Chronic diastolic (congestive) heart failure: Secondary | ICD-10-CM | POA: Diagnosis not present

## 2022-07-21 DIAGNOSIS — Z85828 Personal history of other malignant neoplasm of skin: Secondary | ICD-10-CM | POA: Diagnosis not present

## 2022-07-21 DIAGNOSIS — R1319 Other dysphagia: Secondary | ICD-10-CM

## 2022-07-21 DIAGNOSIS — R413 Other amnesia: Secondary | ICD-10-CM

## 2022-07-21 DIAGNOSIS — D72829 Elevated white blood cell count, unspecified: Secondary | ICD-10-CM | POA: Diagnosis present

## 2022-07-21 DIAGNOSIS — Z1152 Encounter for screening for COVID-19: Secondary | ICD-10-CM | POA: Diagnosis not present

## 2022-07-21 DIAGNOSIS — J9611 Chronic respiratory failure with hypoxia: Secondary | ICD-10-CM

## 2022-07-21 DIAGNOSIS — R569 Unspecified convulsions: Secondary | ICD-10-CM

## 2022-07-21 DIAGNOSIS — R0602 Shortness of breath: Secondary | ICD-10-CM | POA: Diagnosis present

## 2022-07-21 DIAGNOSIS — F0390 Unspecified dementia without behavioral disturbance: Secondary | ICD-10-CM | POA: Diagnosis not present

## 2022-07-21 DIAGNOSIS — K219 Gastro-esophageal reflux disease without esophagitis: Secondary | ICD-10-CM

## 2022-07-21 LAB — URINALYSIS, ROUTINE W REFLEX MICROSCOPIC
Bilirubin Urine: NEGATIVE
Glucose, UA: NEGATIVE mg/dL
Hgb urine dipstick: NEGATIVE
Ketones, ur: NEGATIVE mg/dL
Leukocytes,Ua: NEGATIVE
Nitrite: NEGATIVE
Protein, ur: NEGATIVE mg/dL
Specific Gravity, Urine: 1.01 (ref 1.005–1.030)
pH: 5 (ref 5.0–8.0)

## 2022-07-21 LAB — RESPIRATORY PANEL BY PCR

## 2022-07-21 LAB — PROCALCITONIN: Procalcitonin: <0.1 ng/mL

## 2022-07-21 LAB — STREP PNEUMONIAE URINARY ANTIGEN: Strep Pneumo Urinary Antigen: NEGATIVE

## 2022-07-21 LAB — GLUCOSE, CAPILLARY: Glucose-Capillary: 193 mg/dL — ABNORMAL HIGH (ref 70–99)

## 2022-07-21 MED ORDER — PREDNISONE 50 MG PO TABS: 60.0000 mg | ORAL_TABLET | Freq: Every day | ORAL | Status: DC

## 2022-07-21 MED ORDER — SODIUM CHLORIDE 0.9 % IV SOLN: 1.0000 g | INTRAVENOUS | Status: DC

## 2022-07-21 MED ORDER — SODIUM CHLORIDE 0.9% FLUSH
3.0000 mL | Freq: Two times a day (BID) | INTRAVENOUS | Status: DC
  Administered 2022-07-21 – 2022-07-22 (×3): 3 mL via INTRAVENOUS

## 2022-07-21 MED ORDER — DOXYCYCLINE HYCLATE 100 MG PO TABS
100.0000 mg | ORAL_TABLET | Freq: Two times a day (BID) | ORAL | Status: DC
  Administered 2022-07-21 – 2022-07-22 (×3): 100 mg via ORAL
  Filled 2022-07-21 (×3): qty 1

## 2022-07-21 MED ORDER — ACETAMINOPHEN 325 MG PO TABS: 650.0000 mg | ORAL_TABLET | Freq: Four times a day (QID) | ORAL | Status: DC | PRN

## 2022-07-21 MED ORDER — PREDNISONE 50 MG PO TABS
60.0000 mg | ORAL_TABLET | Freq: Once | ORAL | Status: AC
  Administered 2022-07-21: 60 mg via ORAL
  Filled 2022-07-21: qty 1

## 2022-07-21 MED ORDER — GUAIFENESIN ER 600 MG PO TB12
600.0000 mg | ORAL_TABLET | Freq: Two times a day (BID) | ORAL | Status: DC
  Administered 2022-07-21 – 2022-07-22 (×3): 600 mg via ORAL
  Filled 2022-07-21 (×3): qty 1

## 2022-07-21 MED ORDER — ACETAMINOPHEN 650 MG RE SUPP: 650.0000 mg | Freq: Four times a day (QID) | RECTAL | Status: DC | PRN

## 2022-07-21 MED ORDER — PREDNISONE 20 MG PO TABS
40.0000 mg | ORAL_TABLET | Freq: Every day | ORAL | Status: DC
  Administered 2022-07-22: 40 mg via ORAL
  Filled 2022-07-21: qty 2

## 2022-07-21 MED ORDER — SODIUM CHLORIDE 0.9 % IV SOLN
2.0000 g | INTRAVENOUS | Status: DC
  Administered 2022-07-21: 2 g via INTRAVENOUS
  Filled 2022-07-21 (×2): qty 20

## 2022-07-21 MED ORDER — ENOXAPARIN SODIUM 40 MG/0.4ML IJ SOSY
40.0000 mg | PREFILLED_SYRINGE | INTRAMUSCULAR | Status: DC
  Administered 2022-07-21: 40 mg via SUBCUTANEOUS
  Filled 2022-07-21: qty 0.4

## 2022-07-21 NOTE — ED Notes (Signed)
Report to [place], Therapist, sports

## 2022-07-21 NOTE — H&P (Addendum)
History and Physical    Patient: Frank Meyer DOB: 07-15-1941 DOA: 07/20/2022 DOS: the patient was seen and examined on 07/21/2022 PCP: Burnard Bunting, MD  Patient coming from: [facility]  Chief Complaint:  Chief Complaint  Patient presents with   Shortness of Breath   hypoxia   HPI: Frank Meyer is a 81 y.o. 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  History is obtained from the patient and additional history obtained from his wife over the phone.  Patient states at baseline his oxygenation dropped into the 60s when he is up and ambulating despite his oxygen being on 6 L.  His driveway is up an incline for which he states that it is not uncommon that he needs to take a break to gather himself once he reaches the top of the hill.  Yesterday, he had walked down the hill and after getting back up the driveway needed to catch his breath.  His wife reports that his neighbors saw him bent over and distress for which they got him to the hospital.  He reports having a fall down the driveway approximately 6 weeks ago where he does not think that his oxygen was on.  He did not fall yesterday.  Patient admits that he does have a difficult time swallowing his omeprazole pills, but otherwise has no difficulty with eating.  He denies having any fever, wheezing, nausea, vomiting, diarrhea, or recent sick contacts.  Review of records note the patient had a modified barium swallow for which patient was recommended to have a dysphagia 3 diet with thin liquids and medications crushed with pure.  In the emergency department patient was noted to be afebrile with respirations 14-31, pulse 55 112, blood pressures  86/46 -131/70, and O2 saturations initially as low as 65% with improvement with currently on 5 L of oxygen with O2 saturations  maintained.  Labs are significant for WBC 12.5, BUN 22, and creatinine 1.45.  Influenza and COVID-19 screening were negative.  CT angiogram of the chest of the chest noted concern for developing infection/inflammation of the bilateral lower lobes and right upper lobe superimposed on background of erythema, increased size of pulmonary nodule of the right upper lobe, and interval development of a nodular-like opacity of the lingula measuring 1.6 x 1.4 cm..  Patient follows with Dr. Silas Flood.  Patient has been given Rocephin, doxycycline, and IV Solu-Medrol.  Review of Systems: As mentioned in the history of present illness. All other systems reviewed and are negative. Past Medical History:  Diagnosis Date   Allergic rhinitis 10/24/2009   Altered bowel habits 10/04/2020   Basal cell carcinoma    Benign prostatic hyperplasia with lower urinary tract symptoms 10/24/2009   Benign prostatic hypertrophy    Bradycardia 12/06/2019   Callosity 10/16/2013   Cardiomegaly 01/23/2015   Chronic diastolic heart failure ([place]) 12/19/2018   Chronic respiratory failure ([place]) 10/04/2020   Coronary artery calcification seen on CAT scan 05/28/2020   Dyslipidemia    Dyspnea on exertion 03/18/2018   Emphysema lung ([place]) 03/18/2018   Encounter for general adult medical examination without abnormal findings 01/17/2015   Ex-cigarette smoker 01/20/2021   Exudative age-related macular degeneration of left eye with active choroidal neovascularization ([place]) 12/18/2019   Gallbladder problem    Gastroesophageal reflux disease    Hiatal hernia    Hilar adenopathy 03/25/2018   History of  smoking 03/18/2018   Hyperlipidemia 10/24/2009   Insomnia 05/09/2013   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   Internal hemorrhoid 10/04/2020   Macular degeneration    Macular pucker, right eye 04/30/2020   Mediastinal adenopathy 03/25/2018   Memory loss 05/09/2013   Mild cognitive  disorder 10/24/2009   Mild memory disturbance    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   Noninfective gastroenteritis and colitis, unspecified 10/04/2020   Nuclear sclerotic cataract of right eye 12/18/2019   Other specified symptoms and signs involving the digestive system and abdomen 10/04/2020   Overweight 09/14/2018   Oxygen dependent 10/04/2020   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 His smoking use included cigarettes. He has a 50.00 pack-year smoking history. He has never used smokeless tobacco. He reports current alcohol use of about 15.0 - 20.0 standard drinks of alcohol per week. He reports that he does not use drugs.  No Known Allergies  Family History  Problem Relation Age of Onset   Obesity Mother    Memory loss Mother     Prior to Admission medications   Medication Sig Start Date End Date Taking? Authorizing Provider  ANORO ELLIPTA 62.5-25 MCG/ACT AEPB Inhale 1 puff into the lungs daily. 06/08/22  Yes [provider]  aspirin 81 MG EC tablet Take 81 mg by mouth daily.   Yes [provider]  atorvastatin (LIPITOR) 20 MG tablet Take 20 mg by mouth daily.   Yes [provider]  cholecalciferol (VITAMIN D) 1000 UNITS tablet Take 1,000 Units by mouth daily.   Yes [provider]  donepezil (ARICEPT) 10 MG tablet Take 1 tablet (10 mg total) by mouth daily. Patient taking differently: Take 10 mg by  mouth at bedtime. 02/03/22  Yes Suzzanne Cloud, NP  fexofenadine (ALLEGRA) 180 MG tablet Take 180 mg by mouth daily.   Yes [provider]  finasteride (PROSCAR) 5 MG tablet Take 5 mg by mouth at bedtime.   Yes [provider]  ipratropium (ATROVENT) 0.06 % nasal spray Place 1-2 sprays into both nostrils See admin instructions. 2 sprays in the right nostril and 1 spray in the left nostril once daily 02/09/13  Yes [provider]  levETIRAcetam (KEPPRA) 250 MG tablet Take 1 in the morning, take 2 at night Patient taking differently: Take 250-500 mg by mouth See admin instructions. Take 250 mg by mouth in the morning and 500 mg by mouth at night. 02/03/22  Yes Suzzanne Cloud, NP  Melatonin 10 MG TABS Take 20 mg by mouth at bedtime.   Yes [provider]  nitroGLYCERIN (NITROSTAT) 0.4 MG SL tablet Place 1 tablet (0.4 mg total) under the tongue every 5 (five) minutes as needed. Chest pain Patient taking differently: Place 0.4 mg under the tongue every 5 (five) minutes as needed for chest pain. Chest pain 07/15/21 07/21/22 Yes Park Liter, MD  omeprazole (PRILOSEC) 40 MG  capsule Take 40 mg by mouth daily.  03/29/13  Yes [provider]  OXYGEN Inhale 2-5 L into the lungs as directed. 5lt during the day and 2lt at night   Yes [provider]  sacubitril-valsartan (ENTRESTO) 24-26 MG Take 1 tablet by mouth daily. Patient taking differently: Take 1 tablet by mouth every evening. 02/20/22  Yes Park Liter, MD  thiamine 100 MG tablet Take 1 tablet (100 mg total) by mouth daily. 12/13/18  Yes Geradine Girt, DO  traZODone (DESYREL) 100 MG tablet Take 2 tablets (200 mg total) by mouth at bedtime. 02/03/22  Yes Suzzanne Cloud, NP    Physical Exam: Vitals:   07/21/22 0809 07/21/22 0810 07/21/22 0900 07/21/22 1041  BP: (!) 86/46  (!) 110/58 115/63  Pulse: 63  66 67  Resp: 13  15   Temp: 98.7 F (37.1 C)   (!) 97.5 F (36.4 C)  TempSrc: Oral    Oral  SpO2: 96% 96% 100% 96%   Constitutional: Elderly male currently in no acute distress Eyes: PERRL, lids and conjunctivae normal ENMT: Mucous membranes are moist. Posterior pharynx clear of any exudate or lesions.  Neck: normal, supple  Respiratory: Normal respiratory effort with decreased overall aeration, but no significant wheezing or crackles appreciated.  O2 saturations currently on 5.5 L and patient able to talk in fairly complete sentences Cardiovascular: Regular rate and rhythm, no murmurs / rubs / gallops. No extremity edema. Abdomen: no tenderness, no masses palpated.  Bowel sounds positive.  Musculoskeletal: no clubbing / cyanosis. No joint deformity upper and lower extremities. Good ROM, no contractures. Normal muscle tone.  Skin: no rashes, lesions, ulcers. No induration Neurologic: CN 2-12 grossly intact. Strength 5/5 in all 4.  Psychiatric: Mild memory impairment. Alert and oriented x 3. Normal mood.   Data Reviewed:  Reviewed labs, imaging, and pertinent records as noted above in HPI  Assessment and Plan: Suspected aspiration pneumonitis Acute.  Patient presents with complaints of progressively worsening shortness of breath.  CTA of the chest concerning for inflammatory/infection of the bilateral lower lobes lung.  Patient has been started on empiric antibiotics of Rocephin and doxycycline.  Initial procalcitonin negative.  Symptoms thought possibly secondary to patient aspirating causing aspiration pneumonitis. -Admit to a telemetry bed -Continuous pulse oximetry with nasal cannula oxygen maintain O2 saturation greater than 92% -Incentive spirometer and flutter valve -Check procalcitonin -Continue Rocephin and doxycycline  -Prednisone taper -Mucinex -Discussed with Dr. Silas Flood over the phone who agreed symptoms likely secondary to the patient possibly aspirating.  Recommended rechecking procalcitonin in a.m. and if negative or flat to stop antibiotics and send home  with steroid taper.  Patient's wife rescheduled his outpatient appointment with Dr. Silas Flood  Leukocytosis Acute.  WBC elevated 12.5 on admission.  Suspect reactive to above. -Continue to monitor  Chronic respiratory failure with hypoxia COPD, without acute exacerbation Patient on 5-6L of oxygen at baseline with O2 saturations currently maintained.  He normally we will desat into the 60s due to his lung disease.  He brings up [place], but states that he wants to be a full code at this time.   -May benefit from palliative care consult for goals of care in the outpatient setting. -PT to eval and treat in a.m. to see if patient needs some assistive device to get around   Dysphagia Patient had a modified barium swallow in November which recommended at dysphagia 3 diet of solid foods with thin liquids, and all meds  crushed in pure.  The patient and wife were not aware of the result's. -Dysphagia 3 diet  Mild memory disorder Patient has mild memory issues. -Continue Aricept  Seizure disorder No reported recent seizures -Continue Keppra  BPH -Continue finasteride  GERD  -Continue Protonix  Pulmonary nodules As seen on CT imaging. -Follow-up with Dr. Silas Flood regarding the nodules  DVT prophylaxis: Lovenox Advance Care Planning:   Code Status: Full Code    Consults: Telephone call to Dr. Silas Flood  Family Communication: Wife updated over the phone  Severity of Illness: The appropriate patient status for this patient is OBSERVATION. Observation status is judged to be reasonable and necessary in order to provide the required intensity of service to ensure the patient's safety. The patient's presenting symptoms, physical exam findings, and initial radiographic and laboratory data in the context of their medical condition is felt to place them at decreased risk for further clinical deterioration. Furthermore, it is anticipated that the patient will be medically stable for  discharge from the hospital within 2 midnights of admission.   Author: Norval Morton, MD 07/21/2022 11:15 AM  For on call review [email or web address]. Labs significant for

## 2022-07-22 DIAGNOSIS — J9611 Chronic respiratory failure with hypoxia: Secondary | ICD-10-CM | POA: Diagnosis not present

## 2022-07-22 DIAGNOSIS — J449 Chronic obstructive pulmonary disease, unspecified: Secondary | ICD-10-CM | POA: Diagnosis not present

## 2022-07-22 DIAGNOSIS — D72829 Elevated white blood cell count, unspecified: Secondary | ICD-10-CM | POA: Diagnosis not present

## 2022-07-22 DIAGNOSIS — J690 Pneumonitis due to inhalation of food and vomit: Secondary | ICD-10-CM | POA: Diagnosis not present

## 2022-07-22 LAB — CBC
HCT: 37.7 % — ABNORMAL LOW (ref 39.0–52.0)
Hemoglobin: 11.9 g/dL — ABNORMAL LOW (ref 13.0–17.0)
MCH: 27 pg (ref 26.0–34.0)
MCHC: 31.6 g/dL (ref 30.0–36.0)
MCV: 85.5 fL (ref 80.0–100.0)
Platelets: 256 10*3/uL (ref 150–400)
RBC: 4.41 MIL/uL (ref 4.22–5.81)
RDW: 14.4 % (ref 11.5–15.5)
WBC: 18.6 10*3/uL — ABNORMAL HIGH (ref 4.0–10.5)
nRBC: 0 % (ref 0.0–0.2)

## 2022-07-22 LAB — BASIC METABOLIC PANEL
Anion gap: 7 (ref 5–15)
BUN: 25 mg/dL — ABNORMAL HIGH (ref 8–23)
CO2: 25 mmol/L (ref 22–32)
Calcium: 8.3 mg/dL — ABNORMAL LOW (ref 8.9–10.3)
Chloride: 105 mmol/L (ref 98–111)
Creatinine, Ser: 1.23 mg/dL (ref 0.61–1.24)
GFR, Estimated: 59 mL/min — ABNORMAL LOW (ref >60)
Glucose, Bld: 144 mg/dL — ABNORMAL HIGH (ref 70–99)
Potassium: 4.5 mmol/L (ref 3.5–5.1)
Sodium: 137 mmol/L (ref 135–145)

## 2022-07-22 LAB — GLUCOSE, CAPILLARY: Glucose-Capillary: 134 mg/dL — ABNORMAL HIGH (ref 70–99)

## 2022-07-22 LAB — LEGIONELLA PNEUMOPHILA SEROGP 1 UR AG: L. pneumophila Serogp 1 Ur Ag: NEGATIVE

## 2022-07-22 LAB — PROCALCITONIN: Procalcitonin: <0.1 ng/mL

## 2022-07-22 MED ORDER — DOXYCYCLINE HYCLATE 100 MG PO TABS: 100.0000 mg | ORAL_TABLET | Freq: Two times a day (BID) | ORAL | 0 refills | Status: DC

## 2022-07-22 MED ORDER — GUAIFENESIN ER 600 MG PO TB12: 600.0000 mg | ORAL_TABLET | Freq: Two times a day (BID) | ORAL | 0 refills | Status: DC

## 2022-07-22 MED ORDER — PREDNISONE 10 MG PO TABS: ORAL_TABLET | ORAL | 0 refills | Status: DC

## 2022-07-22 NOTE — Discharge Summary (Signed)
Physician Discharge Summary   Patient: Frank Meyer MRN: [id number] DOB: 05/13/1941  Admit date:     07/20/2022  Discharge date: 07/22/22  Discharge Physician: Oswald Hillock   PCP: Burnard Bunting, MD   Recommendations at discharge:   Follow-up PCP as outpatient Lung nodules-CT chest shows interval increase in size of lung nodule right upper lobe 1.4 x 1.4 cm.  Patient has an appointment with pulmonologist on 07/25/2022  Discharge Diagnoses: Principal Problem:   Aspiration pneumonitis ([place]) Active Problems:   Leukocytosis   Chronic respiratory failure with hypoxia (HCC)   COPD (chronic obstructive pulmonary disease) (HCC)   Dysphagia   Mild memory disturbance   Seizures (HCC)   Benign prostatic hyperplasia with lower urinary tract symptoms   GERD (gastroesophageal reflux disease)  Resolved Problems:   * No resolved hospital problems. 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  Patient states that his oxygen saturations dropped to 60s while ambulating despite being on oxygen 6 L/min.  In the ED patient was found to be afebrile with respirations 14-31, with O2 sat 65% with improvement on 5 L of oxygen.  Influenza and COVID-19 screening are negative.  CTA chest was concerning for infection inflammation of bilateral lower lobes and right upper lobe superimposed on background of emphysema.  Patient was started on ceftriaxone and Zithromax.  Procalcitonin x2 obtained was less than 0.10.  Pulmonology was called by admitting provider and recommended the patient to be discharged home if procalcitonin is negative.  Patient was started on prednisone ; WBC count from prednisone.   Assessment and Plan:  Acute exacerbation of COPD -Significantly improved -CT chest was concerning for inflammation of bilateral lower lobes.   Patient was started on empiric antibiotics ceftriaxone and doxycycline.  Procalcitonin was less than 0.10 x2. -Patient breathing has significantly improved, requiring 3 L/min of oxygen at rest -Patient's been on Rocephin and doxycycline -He was started on prednisone 60 mg in the ED -WBC elevated to 18,000, likely from prednisone, he is afebrile -We will discharge home on prednisone taper and doxycycline for 4 days -Patient to follow-up Dr. Silas Flood as outpatient; he has an appointment on 07/25/2022   Dysphagia -Patient had modified barium swallow in November, but it was recommended to dysphagia 3 diet of solid foods and thin liquids -Continue dysphagia diet  Mild memory disorder Patient has mild memory issues. -Continue Aricept   Seizure disorder No reported recent seizures -Continue Keppra   BPH -Continue finasteride   GERD  -Continue Protonix   Pulmonary nodules As seen on CT imaging. -Follow-up with Dr. Silas Flood regarding the nodules         Consultants:  Procedures performed: None Disposition: Home Diet recommendation:  Discharge Diet Orders (From admission, onward)     Start     Ordered   07/22/22 0000  Diet - low sodium heart healthy        07/22/22 1503           Regular diet DISCHARGE MEDICATION: Allergies as of 07/22/2022   No Known Allergies      Medication List     TAKE these medications    Anoro Ellipta 62.5-25 MCG/ACT Aepb Generic drug: umeclidinium-vilanterol Inhale 1 puff into the lungs daily.   aspirin EC 81 MG tablet Take 81 mg by mouth daily.   atorvastatin 20 MG tablet Commonly  known as: LIPITOR Take 20 mg by mouth daily.   cholecalciferol 1000 units tablet Commonly known as: VITAMIN D Take 1,000 Units by mouth daily.   donepezil 10 MG tablet Commonly known as: ARICEPT Take 1 tablet (10 mg total) by mouth daily. What changed: when to take this   doxycycline 100 MG tablet Commonly known as: VIBRA-TABS Take 1 tablet  (100 mg total) by mouth every 12 (twelve) hours.   Entresto 24-26 MG Generic drug: sacubitril-valsartan Take 1 tablet by mouth daily. What changed: when to take this   fexofenadine 180 MG tablet Commonly known as: ALLEGRA Take 180 mg by mouth daily.   finasteride 5 MG tablet Commonly known as: PROSCAR Take 5 mg by mouth at bedtime.   guaiFENesin 600 MG 12 hr tablet Commonly known as: MUCINEX Take 1 tablet (600 mg total) by mouth 2 (two) times daily.   ipratropium 0.06 % nasal spray Commonly known as: ATROVENT Place 1-2 sprays into both nostrils See admin instructions. 2 sprays in the right nostril and 1 spray in the left nostril once daily   levETIRAcetam 250 MG tablet Commonly known as: Keppra Take 1 in the morning, take 2 at night What changed:  how much to take how to take this when to take this additional instructions   Melatonin 10 MG Tabs Take 20 mg by mouth at bedtime.   nitroGLYCERIN 0.4 MG SL tablet Commonly known as: NITROSTAT Place 1 tablet (0.4 mg total) under the tongue every 5 (five) minutes as needed. Chest pain What changed: reasons to take this   omeprazole 40 MG capsule Commonly known as: PRILOSEC Take 40 mg by mouth daily.   OXYGEN Inhale 2-5 L into the lungs as directed. 5lt during the day and 2lt at night   predniSONE 10 MG tablet Commonly known as: DELTASONE Prednisone 40 mg po daily x 2 day then Prednisone 30 mg po daily x 2 day then Prednisone 20 mg po daily x 2 day then Prednisone 10 mg daily x 2 day then stop...   thiamine 100 MG tablet Commonly known as: VITAMIN B1 Take 1 tablet (100 mg total) by mouth daily.   traZODone 100 MG tablet Commonly known as: DESYREL Take 2 tablets (200 mg total) by mouth at bedtime.        Follow-up Information     Burnard Bunting, MD Follow up.   Specialty: Internal Medicine Contact information: [place] [address] [place], [place] Follow  up.   Specialty: Home Health Services Why: your home health PT will be provided by [facility] , start of care within 48 hours post discharge Contact information: [place] [address] [phone]                Discharge Exam: Danley Danker Weights   07/21/22 1533  Weight: 72.6 kg   Lungs-decreased breath sounds at the bases Heart-S1-S2, regular  Condition at discharge: good  The results of significant diagnostics from this hospitalization (including imaging, microbiology, ancillary and laboratory) are listed below for reference.   Imaging Studies: CT Angio Chest PE W and/or Wo Contrast  Result Date: 07/20/2022 CLINICAL DATA:  hypoxia EXAM: CT ANGIOGRAPHY CHEST WITH CONTRAST TECHNIQUE: Multidetector CT imaging of the chest was performed using the standard protocol during bolus administration of intravenous contrast. Multiplanar CT image reconstructions and MIPs were obtained to evaluate the vascular anatomy. RADIATION DOSE REDUCTION: This exam  was performed according to the departmental dose-optimization program which includes automated exposure control, adjustment of the mA and/or kV according to patient size and/or use of iterative reconstruction technique. CONTRAST:  5m OMNIPAQUE IOHEXOL 350 MG/ML SOLN COMPARISON:  None Available. FINDINGS: Cardiovascular: Satisfactory opacification of the pulmonary arteries to the segmental level. No evidence of pulmonary embolism. Artifact along the right upper lobe segmental pulmonary arteries (5:92, 8:74) With no definite filling defect to suggest true pulmonary embolus. Normal heart size. No significant pericardial effusion. The thoracic aorta is normal in caliber. Mild atherosclerotic plaque of the thoracic aorta. At least 2 vessel coronary artery calcifications. Prominent but nonenlarged mediastinal Mediastinum/Nodes: Lymph nodes. There is an enlarged 1.8 cm right hilar lymph node (5:118). No enlarged mediastinal  or axillary lymph nodes. Thyroid gland, trachea, and esophagus demonstrate no significant findings. Small hiatal hernia. Lungs/Pleura: Paraseptal and centrilobular moderate severe emphysematous changes. Interval development of ground-glass and consolidative airspace opacities of the bilateral lower lobes. Similar patchy consolidative findings of the right upper lobe. Persistent pleural/pulmonary scarring at the right apex. Interval development of a nodular-like airspace opacity of the lingula measuring 1.6 x 1.4 cm (6:48, 7:65). Interval increase in size of a right upper lobe 1.4 x 1.4 cm (from 1 x 1.2 cm) pulmonary nodule (6:22, 7:66). . No pulmonary mass. Bilateral trace pleural effusions. No pneumothorax. Upper Abdomen: No acute abnormality. Sing enhancing 0.8 cm lesion within the right posterior hepatic lobe likely representing a flash filling hemangioma. Surgical clips noted within the upper abdomen. Musculoskeletal: No chest wall abnormality. No suspicious lytic or blastic osseous lesions. No acute displaced fracture. Multilevel at least mild degenerative changes of the spine. T2 vertebral body hemangioma. Review of the MIP images confirms the above findings. IMPRESSION: 1. Developing infection/inflammation of bilateral lower lobes and right upper lobe superimposed on a background of emphysema. Associated bilateral trace pleural effusions. 2. Interval increase in size of a right upper lobe 1.4 x 1.4 cm (from 1 x 1.2 cm) pulmonary nodule. Additional imaging evaluation or consultation with Pulmonology or Thoracic Surgery recommended. 3. Interval development of a Nodular-like airspace opacity of the lingula measuring 1.6 x 1.4 cm. Consider one of the following in 3 months for both low-risk and high-risk individuals: (a) repeat chest CT, (b) follow-up PET-CT, or (c) tissue sampling. This recommendation follows the consensus statement: Guidelines for Management of Incidental Pulmonary Nodules Detected on CT Images:  From the Fleischner Society 2017; Radiology 2017; 284:228-243. 4. Right hilar lymphadenopathy and prominent but nonenlarged mediastinal lymph nodes likely reactive in etiology. Recommend attention on follow-up. 5. Small hiatal hernia. Electronically Signed   By: MIven FinnM.D.   On: 07/20/2022 14:53   DG Chest Portable 1 View  Result Date: 07/20/2022 CLINICAL DATA:  Low oxygen saturations EXAM: PORTABLE CHEST 1 VIEW COMPARISON:  Chest radiograph 05/28/2022 and CT chest 06/27/2021. FINDINGS: The cardiomediastinal silhouette is grossly stable allowing for leftward patient rotation on the current study. There are peripheral reticular opacities in both lung bases which are overall stable from the prior study consistent with underlying fibrotic lung disease. There is no convincing acute airspace opacity. There is no pulmonary edema. There is no significant pleural effusion. There is no appreciable pneumothorax There is no displaced rib fracture or other acute osseous abnormality. IMPRESSION: Peripheral reticular opacities in both lungs consistent with underlying chronic fibrotic lung disease. No convincing acute airspace opacity. Electronically Signed   By: PValetta MoleM.D.   On: 07/20/2022 12:27   DG SWALLOW FUNC OP  MEDICARE SPEECH PATH  Result Date: 06/24/2022 Table formatting from the original result was not included. Objective Swallowing Evaluation: Type of Study: MBS-Modified Barium Swallow Study  Patient Details Name: ALIEU FINNIGAN MRN: [id number] Date of Birth: 06/12/1941 Today's Date: 06/24/2022 Time: SLP Start Time (ACUTE ONLY): 19 -SLP Stop Time (ACUTE ONLY): 1120 SLP Time Calculation (min) (ACUTE ONLY): 20 min Past Medical History: Past Medical History: Diagnosis Date  Allergic rhinitis 10/24/2009  Altered bowel habits 10/04/2020  Basal cell carcinoma   Benign prostatic hyperplasia with lower urinary tract symptoms 10/24/2009  Benign prostatic hypertrophy   Bradycardia 12/06/2019  Callosity  10/16/2013  Cardiomegaly 0/03/1447  Chronic diastolic heart failure ([place]) 12/19/2018  Chronic respiratory failure ([place]) 10/04/2020  Coronary artery calcification seen on CAT scan 05/28/2020  Dyslipidemia   Dyspnea on exertion 03/18/2018  Emphysema lung ([place]) 03/18/2018  Encounter for general adult medical examination without abnormal findings 01/17/2015  Ex-cigarette smoker 01/20/2021  Exudative age-related macular degeneration of left eye with active choroidal neovascularization ([place]) 12/18/2019  Gallbladder problem   Gastroesophageal reflux disease   Hiatal hernia   Hilar adenopathy 03/25/2018  History of smoking 03/18/2018  Hyperlipidemia 10/24/2009  Insomnia 05/09/2013  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  Internal hemorrhoid 10/04/2020  Macular pucker, right eye 04/30/2020  Mediastinal adenopathy 03/25/2018  Memory loss 05/09/2013  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  Mild memory disturbance   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  Noninfective gastroenteritis and colitis, unspecified 10/04/2020  Nuclear sclerotic cataract of right eye 12/18/2019  Other specified symptoms and signs involving the digestive system and abdomen 10/04/2020  Overweight 09/14/2018  Oxygen dependent 10/04/2020  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 male with PMH: COPD/emphysema on chronic supplemental oxygen, chronic hypoxemic respiratory failure, GERD, hiatal hernia, TIA. 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 Recommendation from esophagram was for MBS. Patient himself c/o difficulty with larger pills and globus sensation.  Subjective: pleasant, doesnt remember esophagram from last year even after SLP describes it; he reports he has some memory impairment  Recommendations for follow up therapy are one component of a multi-disciplinary discharge planning process, led by the attending physician.  Recommendations may be updated based on patient status, additional functional criteria and insurance authorization. Assessment / Plan / Recommendation   06/24/2022   1:00 PM Clinical Impressions Clinical Impression Patient presents with a mild-moderate pharyngoesophageal dysphagia as per this MBS. Oral phase appeared [facility] for tested consistencies: thin liquids, nectar thick liquids, puree solids, mechanical soft solids. During pharyngeal phase, patient exhibited mildly delayed swallow initiation at level of the vallecular sinus with liquids and solids. Vallecular sinus residuals were present with all tested consistencies; with thin liquids vallecular residuals were trace to mild, nectar thick were mild, puree solids and mechanical soft solids were moderate. Cued dry swallows did help to clear residuals but full clearance of vallecular sinus residuals never occured. There was one instance of penetration to  the cords (silent penetration) with thin liquids when patient taking a sip of thin while having moderate vallecular residuals from mechanical soft solids. No aspiration occurred during any phase of the swallow with any of the tested consistencies. During cervical esophageal phase, trace amount of retrograde movement of thin and nectar thick liquid barium observed but barium stayed within cervical esophagus. SLP recommending Dys 3 (mechanical soft) solids, thin liquids and meds crushed in puree or  in liquid form. SLP Visit Diagnosis Dysphagia, pharyngoesophageal phase (R13.14) Impact on safety and function Mild aspiration risk       06/24/2022   1:00 PM Diet Recommendations SLP Diet Recommendations Dysphagia 3 (Mech soft) solids;Thin liquid Liquid Administration via Straw;Cup Medication Administration Crushed with puree Postural Changes Seated upright at 90 degrees;Remain semi-upright after after feeds/meals (Comment)       06/24/2022   1:00 PM Oral Phase Oral Phase [facility]    06/24/2022   1:00 PM Pharyngeal Phase Pharyngeal Phase Impaired Pharyngeal- Nectar Cup Delayed swallow initiation-vallecula;Reduced tongue base retraction;Pharyngeal residue - valleculae;Pharyngeal residue - pyriform Pharyngeal- Thin Cup Delayed swallow initiation-vallecula;Pharyngeal residue - valleculae;Reduced tongue base retraction Pharyngeal- Thin Straw Delayed swallow initiation-vallecula;Pharyngeal residue - valleculae;Reduced tongue base retraction Pharyngeal- Puree Delayed swallow initiation-vallecula;Pharyngeal residue - valleculae;Reduced tongue base retraction Pharyngeal- Mechanical Soft Delayed swallow initiation-vallecula;Reduced tongue base retraction;Pharyngeal residue - valleculae    06/24/2022   1:00 PM Cervical Esophageal Phase  Cervical Esophageal Phase Impaired Nectar Cup Reduced cricopharyngeal relaxation;Esophageal backflow into cervical esophagus Thin Cup Reduced cricopharyngeal relaxation;Esophageal backflow into cervical esophagus Thin Straw Reduced cricopharyngeal relaxation;Esophageal backflow into cervical esophagus Puree WFL Mechanical Soft WFL Sonia Baller, MA, CCC-SLP Speech Therapy  CLINICAL DATA:  Provided history: Dysphagia, unspecified type. EXAM: MODIFIED BARIUM SWALLOW TECHNIQUE: Different consistencies of barium were administered orally to the patient by the Speech Pathologist. Imaging of the pharynx was performed in the lateral projection. [name], PA-C was present in the fluoroscopy room and  operated the fluoroscopy equipment. FLUOROSCOPY: Fluoroscopy time: 1 minute, 42 seconds (8 mGy). COMPARISON:  None. FINDINGS: Different consistencies of barium were administered orally to the patient by the Speech Pathologist with fluoroscopic imaging of the pharynx from a lateral projection. [name], PA-C was present in the fluoroscopy room and operated the fluoroscopy equipment. The speech pathologist observed laryngeal penetration with all consistencies. Aspiration was observed with the patient taking thin barium while attempting to clear a graham cracker. Please refer to the Speech Pathologist's report for full details. IMPRESSION: Modified barium swallow. Aspiration and laryngeal penetration were observed, as described. Please refer to the Speech Pathologist's report for complete details and recommendations. Electronically Signed   By: Kellie Simmering D.O.   On: 06/24/2022 12:50   Microbiology: Results for orders placed or performed during the hospital encounter of 07/20/22  Resp Panel by RT-PCR (Flu A&B, Covid) Anterior Nasal Swab     Status: None   Collection Time: 07/20/22  1:10 PM   Specimen: Anterior Nasal Swab  Result Value Ref Range Status   SARS Coronavirus 2 by RT PCR NEGATIVE NEGATIVE Final    Comment: (NOTE) SARS-CoV-2 target nucleic acids are NOT DETECTED.  The SARS-CoV-2 RNA is generally detectable in upper respiratory specimens during the acute phase of infection. The lowest concentration of SARS-CoV-2 viral copies this assay can detect is 138 copies/mL. A negative result does not preclude SARS-Cov-2 infection and should not be used as the sole basis for treatment or other patient management decisions. A negative result may occur with  improper specimen  collection/handling, submission of specimen other than nasopharyngeal swab, presence of viral mutation(s) within the areas targeted by this assay, and inadequate number of viral copies(<138 copies/mL). A negative result must  be combined with clinical observations, patient history, and epidemiological information. The expected result is Negative.  Fact Sheet for Patients:  EntrepreneurPulse.com.au  Fact Sheet for Healthcare Providers:  IncredibleEmployment.be  This test is no t yet approved or cleared by the [place] FDA and  has been authorized for detection and/or diagnosis of SARS-CoV-2 by FDA under an Emergency Use Authorization (EUA). This EUA will remain  in effect (meaning this test can be used) for the duration of the COVID-19 declaration under Section 564(b)(1) of the Act, 21 U.S.C.section 360bbb-3(b)(1), unless the authorization is terminated  or revoked sooner.       Influenza A by PCR NEGATIVE NEGATIVE Final   Influenza B by PCR NEGATIVE NEGATIVE Final    Comment: (NOTE) The Xpert Xpress SARS-CoV-2/FLU/RSV plus assay is intended as an aid in the diagnosis of influenza from Nasopharyngeal swab specimens and should not be used as a sole basis for treatment. Nasal washings and aspirates are unacceptable for Xpert Xpress SARS-CoV-2/FLU/RSV testing.  Fact Sheet for Patients: EntrepreneurPulse.com.au  Fact Sheet for Healthcare Providers: IncredibleEmployment.be  This test is not yet approved or cleared by the [place] FDA and has been authorized for detection and/or diagnosis of SARS-CoV-2 by FDA under an Emergency Use Authorization (EUA). This EUA will remain in effect (meaning this test can be used) for the duration of the COVID-19 declaration under Section 564(b)(1) of the Act, 21 U.S.C. section 360bbb-3(b)(1), unless the authorization is terminated or revoked.  Performed at [facility], [address] [place], [place] [address]   Respiratory (~20 pathogens) panel by PCR     Status: None   Collection Time: 07/21/22  5:25 PM   Specimen: Nasopharyngeal Swab; Respiratory  Result Value Ref  Range Status   Adenovirus NOT DETECTED NOT DETECTED Final   Coronavirus 229E NOT DETECTED NOT DETECTED Final    Comment: (NOTE) The Coronavirus on the Respiratory Panel, DOES NOT test for the novel  Coronavirus (2019 nCoV)    Coronavirus HKU1 NOT DETECTED NOT DETECTED Final   Coronavirus NL63 NOT DETECTED NOT DETECTED Final   Coronavirus OC43 NOT DETECTED NOT DETECTED Final   Metapneumovirus NOT DETECTED NOT DETECTED Final   Rhinovirus / Enterovirus NOT DETECTED NOT DETECTED Final   Influenza A NOT DETECTED NOT DETECTED Final   Influenza B NOT DETECTED NOT DETECTED Final   Parainfluenza Virus 1 NOT DETECTED NOT DETECTED Final   Parainfluenza Virus 2 NOT DETECTED NOT DETECTED Final   Parainfluenza Virus 3 NOT DETECTED NOT DETECTED Final   Parainfluenza Virus 4 NOT DETECTED NOT DETECTED Final   Respiratory Syncytial Virus NOT DETECTED NOT DETECTED Final   Bordetella pertussis NOT DETECTED NOT DETECTED Final   Bordetella Parapertussis NOT DETECTED NOT DETECTED Final   Chlamydophila pneumoniae NOT DETECTED NOT DETECTED Final   Mycoplasma pneumoniae NOT DETECTED NOT DETECTED Final    Comment: Performed at [facility], [place]. [address], [place], [place] [address]    Labs: CBC: Recent Labs  Lab 07/20/22 1145 07/20/22 1415 07/22/22 0240  WBC 12.5*  --  18.6*  NEUTROABS 10.0*  --   --   HGB 13.1 13.6 11.9*  HCT 41.8 40.0 37.7*  MCV 85.0  --  85.5  PLT 245  --  789   Basic Metabolic Panel: Recent Labs  Lab 07/20/22 1145 07/20/22 1415 07/22/22 0240  NA 137 137 137  K 4.7 4.6 4.5  CL 104  --  105  CO2 26  --  25  GLUCOSE 122*  --  144*  BUN 22  --  25*  CREATININE 1.45*  --  1.23  CALCIUM 8.7*  --  8.3*   Liver Function Tests: No results for input(s): "AST", "ALT", "ALKPHOS", "BILITOT", "PROT", "ALBUMIN" in the last 168 hours. CBG: Recent Labs  Lab 07/21/22 2018 07/22/22 0442  GLUCAP 193* 134*    Discharge time spent: greater than 30  minutes.  Signed: Oswald Hillock, MD [facility] 07/22/2022

## 2022-07-22 NOTE — Progress Notes (Signed)
Nurse requested Mobility Specialist to perform oxygen saturation test with pt which includes removing pt from oxygen both at rest and while ambulating.  Below are the results from that testing.     Patient Saturations on Room Air at Rest = spO2 92%  Patient Saturations on Room Air while Ambulating = sp02 66% .  Rested and performed pursed lip breathing for 1 minute with sp02 at 69%.  Patient Saturations on 3 Liters of oxygen while Ambulating = sp02 74%  At end of testing pt left in room on 3  Liters of oxygen.  Reported results to nurse.    [place] Specialist Please contact via SecureChat or Rehab office at [phone]

## 2022-07-22 NOTE — Plan of Care (Signed)

## 2022-07-22 NOTE — Progress Notes (Signed)
Mobility Specialist - Progress Note   07/22/22 1022  Mobility  Activity Ambulated with assistance in hallway  Level of Assistance Contact guard assist, steadying assist  Assistive Device None  Distance Ambulated (ft) 200 ft  Activity Response Tolerated well  $Mobility charge 1 Mobility    Pt received in recliner agreeable to mobility. MinG assist used for safety. No complaints throughout. Left in recliner w/ call bell in reach and all needs met.    [place] Specialist Please contact via SecureChat or Rehab office at [phone]

## 2022-07-22 NOTE — TOC Transition Note (Signed)
Transition of Care [facility]) - CM/SW Discharge Note   Patient Details  Name: Frank Meyer MRN: [id number] Date of Birth: 29-Nov-1940  [facility] CM/SW Contact:  Sharin Mons, RN Phone Number: 07/22/2022, 2:58 PM   Clinical Narrative:    Patient will DC to: home Anticipated DC date: 07/22/2022 Family notified: yes, wife Transport by: car  Presents with ? Aspiration pneumonitis. From home with wife. PTA independent with ADL's. Per MD patient ready for DC today. RN, patient,and  patient's wife,  notified of DC.  Pt agreeable to home health PT services. Pt without provider preference. Referral made [facility] and accepted. Pt without DME needs. Pt is home oxygen dependent. Portable oxygen device @ bedside for transportation to home.Pt without RX med concerns. Post hospital f/u noted on AVS. Wife to provide transportation to home.  RNCM will sign off for now as intervention is no longer needed. Please consult [place] again if new needs arise.    Final next level of care: Home/Self Care Barriers to Discharge: No Barriers Identified   Patient Goals and CMS Choice     Choice offered to / list presented to : Patient  Discharge Placement                       Discharge Plan and Services   Discharge Planning Services: CM Consult Post Acute Care Choice: Durable Medical Equipment                    HH Arranged: PT HH Agency: [place] Date [place]: 07/22/22 Time [place]: [place] Representative spoke with at [place]: [place] Determinants of Health ([place]) Interventions     Readmission Risk Interventions     No data to display

## 2022-07-22 NOTE — Progress Notes (Signed)
Fahed Stoney Bang to be D/C'd  per MD order.  Discussed with the patient and wife Pam all questions fully answered.  VSS, Skin clean, dry and intact without evidence of skin break down, no evidence of skin tears noted.  IV catheter discontinued intact. Site without signs and symptoms of complications. Dressing and pressure applied.  An After Visit Summary was printed and given to the patient.  D/c re-educated completed with patient/family including follow up instructions, medication list, d/c activities limitations if indicated, with other d/c instructions as indicated by MD - patient able to verbalize understanding, all questions fully answered.   Patient instructed to return to ED, call 911, or call MD for any changes in condition.   Patient to be escorted via [place], and D/C home via private auto.

## 2022-07-22 NOTE — Evaluation (Signed)
Physical Therapy Evaluation  Patient Details Name: Frank Meyer MRN: [id number] DOB: 09-Aug-1941 Today's Date: 07/22/2022  History of Present Illness  Pt is an 81 y/o male who presents 12/4 from home with decreased O2 sats on supplemental O2. 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   Clinical Impression  Pt admitted with above diagnosis. Pt currently with functional limitations due to the deficits listed below (see PT Problem List). At the time of PT eval pt was able to perform transfers with mod I and ambulation with up to min guard assist due to mild unsteadiness and no AD. Rollator was trialed to see if improvement in O2 sats was significant 2 energy conservation. While balance appeared improved, O2 sats not significantly improved. Pt declining rollator, however feel it could be beneficial for balance support. Educated pt on pursed lip breathing, energy conservation, and monitoring his O2 levels with a pulse-oximeter when mobilizing at home. Acutely, pt will benefit from skilled PT to increase their independence and safety with mobility to allow discharge to the venue listed below.       -Pt on 3L/min O2 at rest -MMT performed on 4L/min supplemental O2 and sats dropped to 66% -Recovered to 95% on 6L/min O2 -Ambulated on 6L/min O2 with sats dropping to 82% - standing rest breaks and pursed lip breathing to recover     Recommendations for follow up therapy are one component of a multi-disciplinary discharge planning process, led by the attending physician.  Recommendations may be updated based on patient status, additional functional criteria and insurance authorization.  Follow Up Recommendations Outpatient PT      Assistance Recommended at Discharge Intermittent Supervision/Assistance  Patient can return home with the  following  A little help with walking and/or transfers;A little help with bathing/dressing/bathroom;Assistance with cooking/housework;Assist for transportation;Help with stairs or ramp for entrance    Equipment Recommendations None recommended by PT (Pt declining rollator)  Recommendations for Other Services       Functional Status Assessment Patient has had a recent decline in their functional status and demonstrates the ability to make significant improvements in function in a reasonable and predictable amount of time.     Precautions / Restrictions Precautions Precautions: Fall Precaution Comments: O2 dependent Restrictions Weight Bearing Restrictions: No      Mobility  Bed Mobility               General bed mobility comments: Pt was received sitting up in the recliner.    Transfers Overall transfer level: Modified independent Equipment used: None               General transfer comment: No assist required for power up to full stand. No unsteadiness or LOB noted.    Ambulation/Gait Ambulation/Gait assistance: Min guard Gait Distance (Feet): 160 Feet Assistive device: None Gait Pattern/deviations: Step-through pattern, Decreased stride length, Trunk flexed Gait velocity: Decreased Gait velocity interpretation: 1.31 - 2.62 ft/sec, indicative of limited community ambulator   General Gait Details: Initial ambulation without AD. Pt mildly unsteady at times and hands on guarding provided for assistance. O2 sats down to 82% at times, but able to recover with standing rest break and pursed lip breathing. Second bout of ambulation with rollator to trial for O2 management but without significant improvement in sats.  Stairs            Emergency planning/management officer  Modified [name] (Stroke Patients Only)       Balance Overall balance assessment: Needs assistance Sitting-balance support: Feet supported, No upper extremity supported Sitting balance-Leahy Scale:  Good     Standing balance support: During functional activity, No upper extremity supported Standing balance-Leahy Scale: Fair                               Pertinent Vitals/Pain Pain Assessment Pain Assessment: No/denies pain    Home Living Family/patient expects to be discharged to:: Private residence Living Arrangements: Spouse/significant other Available Help at Discharge: Family;Available 24 hours/day Type of Home: House Home Access: Stairs to enter   [employer] of Steps: 1   Home Layout: Two level;Able to live on main level with bedroom/bathroom Home Equipment: Hand held shower head Additional Comments: 5L/O2 during the day, 3L/O2 at night, and up to 6L/O2 on portable tank with activity.    Prior Function Prior Level of Function : Independent/Modified Independent;Driving;History of Falls (last six months)             Mobility Comments: Typically pretty active - walks the dog every morning ~15 minutes. 1 fall 6 weeks ago. ADLs Comments: Uses O2 in the shower, does yard work.     Hand Dominance        Extremity/Trunk Assessment   Upper Extremity Assessment Upper Extremity Assessment: Overall WFL for tasks assessed    Lower Extremity Assessment Lower Extremity Assessment: Overall WFL for tasks assessed (Of note, O2 down to 66% on 4L/min supplemental O2 during MMT 2 exertion.)    Cervical / Trunk Assessment Cervical / Trunk Assessment: Other exceptions Cervical / Trunk Exceptions: Forward head posture with rounded shoulders  Communication   Communication: No difficulties  Cognition Arousal/Alertness: Awake/alert Behavior During Therapy: WFL for tasks assessed/performed Overall Cognitive Status: Impaired/Different from baseline Area of Impairment: Safety/judgement                         Safety/Judgement: Decreased awareness of safety, Decreased awareness of deficits              General Comments       Exercises     Assessment/Plan    PT Assessment Patient needs continued PT services  PT Problem List Decreased activity tolerance;Decreased balance;Decreased knowledge of use of DME;Decreased safety awareness;Decreased knowledge of precautions;Cardiopulmonary status limiting activity       PT Treatment Interventions DME instruction;Gait training;Functional mobility training;Therapeutic activities;Therapeutic exercise;Stair training;Balance training;Patient/family education    PT Goals (Current goals can be found in the Care Plan section)  Acute Rehab PT Goals Patient Stated Goal: Be able to walk his dog PT Goal Formulation: With patient Time For Goal Achievement: 07/29/22 Potential to Achieve Goals: Good    Frequency Min 3X/week     Co-evaluation               AM-PAC PT "6 Clicks" Mobility  Outcome Measure Help needed turning from your back to your side while in a flat bed without using bedrails?: None Help needed moving from lying on your back to sitting on the side of a flat bed without using bedrails?: None Help needed moving to and from a bed to a chair (including a wheelchair)?: None Help needed standing up from a chair using your arms (e.g., wheelchair or bedside chair)?: None Help needed to walk in hospital room?: A Little Help needed climbing 3-5 steps with a  railing? : A Little 6 Click Score: 22    End of Session Equipment Utilized During Treatment: Gait belt;Oxygen Activity Tolerance: Patient tolerated treatment well Patient left: in chair;with call bell/phone within reach;with chair alarm set Nurse Communication: Mobility status PT Visit Diagnosis: Unsteadiness on feet (R26.81);Difficulty in walking, not elsewhere classified (R26.2)    Time: 7408-1448 PT Time Calculation (min) (ACUTE ONLY): 50 min   Charges:   PT Evaluation $PT Eval Moderate Complexity: 1 Mod PT Treatments $Gait Training: 23-37 mins        Rolinda Roan, PT, DPT Acute  Rehabilitation Services Secure Chat Preferred Office: [phone]   Thelma Comp 07/22/2022, 3:58 PM

## 2022-07-30 ENCOUNTER — Ambulatory Visit: Payer: Medicare Other | Admitting: Neurology

## 2022-07-30 ENCOUNTER — Encounter: Payer: Self-pay | Admitting: Neurology

## 2022-07-30 VITALS — BP 127/74 | HR 84 | Ht 66.0 in | Wt 169.0 lb

## 2022-07-30 DIAGNOSIS — R413 Other amnesia: Secondary | ICD-10-CM

## 2022-07-30 DIAGNOSIS — G40909 Epilepsy, unspecified, not intractable, without status epilepticus: Secondary | ICD-10-CM | POA: Diagnosis not present

## 2022-07-30 MED ORDER — LEVETIRACETAM 250 MG PO TABS: 250.0000 mg | ORAL_TABLET | ORAL | 3 refills | Status: DC

## 2022-07-30 NOTE — Patient Instructions (Signed)
Continue with Keppra 250/500 mg  Continue your other medications  Follow up with Pulmonology and Cardiology as scheduled  Return in a year or sooner if worse

## 2022-07-30 NOTE — Progress Notes (Signed)
[facility]  PATIENT: Frank Meyer DOB: [birth year]  REQUESTING CLINICIAN: Burnard Bunting, MD HISTORY FROM: Patient and spouse  REASON FOR VISIT: Seizure like activity    HISTORICAL  CHIEF COMPLAINT:  Chief Complaint  Patient presents with   Follow-up    Rm 13 alone  Pt is well and stable, no new concerns     INTERVAL HISTORY 07/30/2022:  Patient presents today for follow-up, he is alone.  At last visit we have started him on Keppra 250 mg in the morning and 500 at night.  Since starting the [place] he has not had any additional episodes concerning for seizures.  Overall he is doing well.  His main complaint is his pulmonary status.  He was admitted to the hospital recently, found to have an infection in the lung.  He is pending follow-up with his pulmonologist scheduled in January.  He reports the other day, he tried to play 9 holes golf but could not complete it due to being out of breath.  Again no seizures and no side effect from the Keppra.   HISTORY OF PRESENT ILLNESS:  This is a 81 year old gentleman with past medical history of pulmonary hypertension, requiring 24/7 supplemental oxygen, memory decline, who is presenting with wife with 3 episodes of seizure-like activity during sleep for the past 2 years.  Wife reports patient has been having activities described as shaking of the upper extremities in the middle of night during sleep, initially she thought he was dreaming but noted that it was very difficult to wake him up during the events.  Afterwards she reports that patient will feel tired and complains of jaw soreness.  Wife reports last episode was 2 weeks ago and it was most severe as patient had shaking of the upper extremities, ?generalized convulsion, with difficulty to wake him up and associated with urinary incontinence.  After the shaking stopped, he was unresponsive for another 10 minutes.  Afterward he was able to take a shower and go to  sleep in the guest room.  The next day patient attempted to play golf but he has to come sooner because he was extremely tired.  He denies any previous history of seizures, previously has was followed by Dr. Jannifer Franklin for cognitive impairment/dementia.  He did not present to the emergency department after each episode but presented to the ED on 3/23 after a syncope while playing golf. Work up at that time was unrevealing  OTHER MEDICAL CONDITIONS: Pulmonary hypertension requiring supplemental O2,  Memory problem, Heart failure, TIA    REVIEW OF SYSTEMS: Full 14 system review of systems performed and negative with exception of: as noted in the HPI   ALLERGIES: No Known Allergies  HOME MEDICATIONS: Outpatient Medications Prior to Visit  Medication Sig Dispense Refill   ANORO ELLIPTA 62.5-25 MCG/ACT AEPB Inhale 1 puff into the lungs daily.     aspirin 81 MG EC tablet Take 81 mg by mouth daily.     atorvastatin (LIPITOR) 20 MG tablet Take 20 mg by mouth daily.     cholecalciferol (VITAMIN D) 1000 UNITS tablet Take 1,000 Units by mouth daily.     donepezil (ARICEPT) 10 MG tablet Take 1 tablet (10 mg total) by mouth daily. (Patient taking differently: Take 10 mg by mouth at bedtime.) 90 tablet 3   doxycycline (VIBRA-TABS) 100 MG tablet Take 1 tablet (100 mg total) by mouth every 12 (twelve) hours. 8 tablet 0   fexofenadine (ALLEGRA) 180 MG tablet Take 180  mg by mouth daily.     finasteride (PROSCAR) 5 MG tablet Take 5 mg by mouth at bedtime.     guaiFENesin (MUCINEX) 600 MG 12 hr tablet Take 1 tablet (600 mg total) by mouth 2 (two) times daily. 10 tablet 0   ipratropium (ATROVENT) 0.06 % nasal spray Place 1-2 sprays into both nostrils See admin instructions. 2 sprays in the right nostril and 1 spray in the left nostril once daily     Melatonin 10 MG TABS Take 20 mg by mouth at bedtime.     omeprazole (PRILOSEC) 40 MG capsule Take 40 mg by mouth daily.      OXYGEN Inhale 2-5 L into the lungs as  directed. 5lt during the day and 2lt at night     predniSONE (DELTASONE) 10 MG tablet Prednisone 40 mg po daily x 2 day then Prednisone 30 mg po daily x 2 day then Prednisone 20 mg po daily x 2 day then Prednisone 10 mg daily x 2 day then stop... 20 tablet 0   sacubitril-valsartan (ENTRESTO) 24-26 MG Take 1 tablet by mouth daily. (Patient taking differently: Take 1 tablet by mouth every evening.) 90 tablet 3   thiamine 100 MG tablet Take 1 tablet (100 mg total) by mouth daily. 30 tablet 0   traZODone (DESYREL) 100 MG tablet Take 2 tablets (200 mg total) by mouth at bedtime. 180 tablet 1   levETIRAcetam (KEPPRA) 250 MG tablet Take 1 in the morning, take 2 at night (Patient taking differently: Take 250-500 mg by mouth See admin instructions. Take 250 mg by mouth in the morning and 500 mg by mouth at night.) 270 tablet 4   nitroGLYCERIN (NITROSTAT) 0.4 MG SL tablet Place 1 tablet (0.4 mg total) under the tongue every 5 (five) minutes as needed. Chest pain (Patient taking differently: Place 0.4 mg under the tongue every 5 (five) minutes as needed for chest pain. Chest pain) 25 tablet 6   No facility-administered medications prior to visit.    PAST MEDICAL HISTORY: Past Medical History:  Diagnosis Date   Allergic rhinitis 10/24/2009   Altered bowel habits 10/04/2020   Basal cell carcinoma    Benign prostatic hyperplasia with lower urinary tract symptoms 10/24/2009   Benign prostatic hypertrophy    Bradycardia 12/06/2019   Callosity 10/16/2013   Cardiomegaly 01/23/2015   Chronic diastolic heart failure ([place]) 12/19/2018   Chronic respiratory failure ([place]) 10/04/2020   Coronary artery calcification seen on CAT scan 05/28/2020   Dyslipidemia    Dyspnea on exertion 03/18/2018   Emphysema lung ([place]) 03/18/2018   Encounter for general adult medical examination without abnormal findings 01/17/2015   Ex-cigarette smoker 01/20/2021   Exudative age-related macular degeneration of left eye with active  choroidal neovascularization ([place]) 12/18/2019   Gallbladder problem    Gastroesophageal reflux disease    Hiatal hernia    Hilar adenopathy 03/25/2018   History of smoking 03/18/2018   Hyperlipidemia 10/24/2009   Insomnia 05/09/2013   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   Internal hemorrhoid 10/04/2020   Macular degeneration    Macular pucker, right eye 04/30/2020   Mediastinal adenopathy 03/25/2018   Memory loss 05/09/2013   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   Mild memory disturbance    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   Noninfective gastroenteritis and colitis, unspecified 10/04/2020   Nuclear sclerotic cataract of right eye 12/18/2019   Other specified symptoms and signs involving the digestive system and abdomen 10/04/2020  Overweight 09/14/2018   Oxygen dependent 10/04/2020   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   Personal history of colonic polyps 10/04/2020   Posterior vitreous detachment of right eye 12/18/2019   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   Pulmonary hypertension ([place])    Right upper lobe pulmonary nodule 03/25/2018   Syncope and collapse 12/12/2018   TIA (transient ischemic attack)    Transient ischemic attack 10/24/2009   Weight decreased 10/04/2020    PAST SURGICAL HISTORY: Past Surgical History:  Procedure Laterality Date   CHOLECYSTECTOMY     none      FAMILY HISTORY: Family History  Problem Relation Age of Onset   Obesity Mother    Memory loss Mother     SOCIAL HISTORY: Social History   Socioeconomic History   Marital status: Married    Spouse name: pamela   Number of children: 2   Years of education: college   Highest education level: Not on file  Occupational History   Occupation: retired  Tobacco Use   Smoking status: Former    Packs/day: 2.00    Years: 25.00    Total pack years: 50.00    Types: Cigarettes    Quit date: 1994     Years since quitting: 29.9   Smokeless tobacco: Never  Substance and Sexual Activity   Alcohol use: Yes    Alcohol/week: 15.0 - 20.0 standard drinks of alcohol    Types: 15 - 20 Cans of beer per week   Drug use: Never   Sexual activity: Not on file  Other Topics Concern   Not on file  Social History Narrative   Pt is retired. He is married. The pt lives with his wife. He has a Best boy. The pt has 2 children.      Patient drinks 2-3 cups of caffeine daily.   Patient is right handed.    Social Determinants of Health   Financial Resource Strain: Not on file  Food Insecurity: No Food Insecurity (07/21/2022)   Hunger Vital Sign    Worried About Running Out of Food in the Last Year: Never true    Ran Out of Food in the Last Year: Never true  Transportation Needs: No Transportation Needs (07/21/2022)   PRAPARE - Hydrologist (Medical): No    Lack of Transportation (Non-Medical): No  Physical Activity: Not on file  Stress: Not on file  Social Connections: Not on file  Intimate Partner Violence: Not At Risk (07/21/2022)   Humiliation, Afraid, Rape, and Kick questionnaire    Fear of Current or Ex-Partner: No    Emotionally Abused: No    Physically Abused: No    Sexually Abused: No    PHYSICAL EXAM  GENERAL EXAM/CONSTITUTIONAL: Vitals:  Vitals:   07/30/22 1108  BP: 127/74  Pulse: 84  Weight: 169 lb (76.7 kg)  Height: '5\' 6"'$  (1.676 m)   Body mass index is 27.28 kg/m. Wt Readings from Last 3 Encounters:  07/30/22 169 lb (76.7 kg)  07/21/22 160 lb (72.6 kg)  05/28/22 166 lb 6.4 oz (75.5 kg)   Patient is in no distress; well developed, nourished and groomed; neck is supple. Supplemental Oxygen  EYES: Visual fields full to confrontation, Extraocular movements intacts,   MUSCULOSKELETAL: Gait, strength, tone, movements noted in Neurologic exam below  NEUROLOGIC: MENTAL STATUS:     07/30/2022   11:15 AM 02/03/2022   10:31 AM  05/29/2021    9:57 AM  MMSE -  Mini Mental State Exam  Orientation to time '5 4 5  '$ Orientation to Place '5 5 5  '$ Registration '3 3 3  '$ Attention/ Calculation '5 5 5  '$ Recall '3 2 2  '$ Language- name 2 objects '2 2 2  '$ Language- repeat '1 1 1  '$ Language- follow 3 step command '3 3 3  '$ Language- read & follow direction '1 1 1  '$ Write a sentence '1 1 1  '$ Copy design '1 1 1  '$ Total score '30 28 29   '$ awake, alert, oriented to person, place and time recent and remote memory intact normal attention and concentration language fluent, comprehension intact, naming intact fund of knowledge appropriate  CRANIAL NERVE:  2nd, 3rd, 4th, 6th - visual fields full to confrontation, extraocular muscles intact, no nystagmus 5th - facial sensation symmetric 7th - facial strength symmetric 8th - hearing intact 9th - palate elevates symmetrically, uvula midline 11th - shoulder shrug symmetric 12th - tongue protrusion midline  MOTOR:  normal bulk and tone, full strength in the BUE, BLE  SENSORY:  normal and symmetric to light touch, pinprick, temperature, vibration  COORDINATION:  finger-nose-finger, fine finger movements normal  REFLEXES:  deep tendon reflexes present and symmetric  GAIT/STATION:  normal   DIAGNOSTIC DATA (LABS, IMAGING, TESTING) - I reviewed patient records, labs, notes, testing and imaging myself where available.  Lab Results  Component Value Date   WBC 18.6 (H) 07/22/2022   HGB 11.9 (L) 07/22/2022   HCT 37.7 (L) 07/22/2022   MCV 85.5 07/22/2022   PLT 256 07/22/2022      Component Value Date/Time   NA 137 07/22/2022 0240   NA 139 10/28/2020 0930   K 4.5 07/22/2022 0240   CL 105 07/22/2022 0240   CO2 25 07/22/2022 0240   GLUCOSE 144 (H) 07/22/2022 0240   BUN 25 (H) 07/22/2022 0240   BUN 15 10/28/2020 0930   CREATININE 1.23 07/22/2022 0240   CALCIUM 8.3 (L) 07/22/2022 0240   PROT 6.8 06/13/2020 0819   ALBUMIN 3.7 06/13/2020 0819   AST 25 06/13/2020 0819   ALT 20 06/13/2020  0819   ALKPHOS 71 06/13/2020 0819   BILITOT 0.9 06/13/2020 0819   GFRNONAA 59 (L) 07/22/2022 0240   GFRAA 64 06/13/2020 0819   Lab Results  Component Value Date   CHOL 147 06/13/2020   HDL 57 06/13/2020   LDLCALC 67 06/13/2020   TRIG 129 06/13/2020   CHOLHDL 2.6 06/13/2020   No results found for: "HGBA1C" No results found for: "VITAMINB12" Lab Results  Component Value Date   TSH 1.890 12/12/2018    Levetiracetam level  12.8  Head CT/Cervical spine 11/06/21 1. No evidence of acute intracranial abnormality. 2. No evidence of acute fracture or traumatic malalignment in the cervical spine. 3. Small sclerotic bone lesions in the C6 and C7 vertebral bodies, probably benign bone islands in the absence of a known primary malignancy.   Routine EEG 01/14/2022 This is an abnormal EEG recording in the waking and sleeping state due to right temporal slowing which is consistent with an area of neuronal dysfunction within the right temporal region   ASSESSMENT AND PLAN  81 y.o. year old male with multiple medical conditions including pulmonary hypertension requiring supplemental O2,  Memory problem, Heart failure, TIA who is presenting for follow up for his seizures.  Since starting the Keppra 250/500 mg he has not had any additional seizures or seizure-like episodes.  He is compliant with the medication, denies any side effect from the  medication and has a good level.  At this time we will continue to patient on Keppra.  In terms of his complaint of memory deficit his [place] today was normal at 30 out of 30.  I have informed the patient that even though he has subjective complaint of memory, his memory testing is within normal limits. I will see him in 1 year or sooner if worse.    1. Seizure disorder ([place])   2. Complaint of memory disorder without observed objective memory deficit      Patient Instructions  Continue with Keppra 250/500 mg  Continue your other medications  Follow up with  Pulmonology and Cardiology as scheduled  Return in a year or sooner if worse   No orders of the defined types were placed in this encounter.   Meds ordered this encounter  Medications   levETIRAcetam (KEPPRA) 250 MG tablet    Sig: Take 1-2 tablets (250-500 mg total) by mouth See admin instructions. Take 250 mg by mouth in the morning and 500 mg by mouth at night.    Dispense:  270 tablet    Refill:  3    Return in about 1 year (around 07/31/2023).  I have spent a total of 30 minutes dedicated to this patient today, preparing to see patient, performing a medically appropriate examination and evaluation, ordering tests and/or medications and procedures, and counseling and educating the patient/family/caregiver; independently interpreting result and communicating results to the family/patient/caregiver; and documenting clinical information in the electronic medical record.   Alric Ran, MD 07/30/2022, 1:35 PM  [facility] [address] [place], [place] [address] [phone]

## 2022-08-02 ENCOUNTER — Other Ambulatory Visit: Payer: Self-pay

## 2022-08-02 ENCOUNTER — Emergency Department (HOSPITAL_COMMUNITY): Payer: Medicare Other

## 2022-08-02 ENCOUNTER — Inpatient Hospital Stay (HOSPITAL_COMMUNITY)
Admission: EM | Admit: 2022-08-02 | Discharge: 2022-08-07 | DRG: 193 | Disposition: A | Discharge status: Home Health | Payer: Medicare Other | Attending: Internal Medicine | Admitting: Internal Medicine

## 2022-08-02 ENCOUNTER — Other Ambulatory Visit (HOSPITAL_COMMUNITY): Payer: Medicare Other

## 2022-08-02 ENCOUNTER — Encounter (HOSPITAL_COMMUNITY): Payer: Self-pay | Admitting: Internal Medicine

## 2022-08-02 DIAGNOSIS — E872 Acidosis, unspecified: Secondary | ICD-10-CM | POA: Diagnosis present

## 2022-08-02 DIAGNOSIS — R0600 Dyspnea, unspecified: Secondary | ICD-10-CM | POA: Diagnosis not present

## 2022-08-02 DIAGNOSIS — Z85828 Personal history of other malignant neoplasm of skin: Secondary | ICD-10-CM

## 2022-08-02 DIAGNOSIS — I2489 Other forms of acute ischemic heart disease: Secondary | ICD-10-CM | POA: Diagnosis present

## 2022-08-02 DIAGNOSIS — E782 Mixed hyperlipidemia: Secondary | ICD-10-CM

## 2022-08-02 DIAGNOSIS — J440 Chronic obstructive pulmonary disease with acute lower respiratory infection: Secondary | ICD-10-CM | POA: Diagnosis present

## 2022-08-02 DIAGNOSIS — N179 Acute kidney failure, unspecified: Secondary | ICD-10-CM | POA: Diagnosis present

## 2022-08-02 DIAGNOSIS — R7989 Other specified abnormal findings of blood chemistry: Secondary | ICD-10-CM | POA: Diagnosis present

## 2022-08-02 DIAGNOSIS — R918 Other nonspecific abnormal finding of lung field: Secondary | ICD-10-CM | POA: Diagnosis not present

## 2022-08-02 DIAGNOSIS — I2510 Atherosclerotic heart disease of native coronary artery without angina pectoris: Secondary | ICD-10-CM | POA: Diagnosis present

## 2022-08-02 DIAGNOSIS — G4700 Insomnia, unspecified: Secondary | ICD-10-CM | POA: Diagnosis present

## 2022-08-02 DIAGNOSIS — I272 Pulmonary hypertension, unspecified: Secondary | ICD-10-CM | POA: Diagnosis present

## 2022-08-02 DIAGNOSIS — K219 Gastro-esophageal reflux disease without esophagitis: Secondary | ICD-10-CM | POA: Diagnosis present

## 2022-08-02 DIAGNOSIS — H353112 Nonexudative age-related macular degeneration, right eye, intermediate dry stage: Secondary | ICD-10-CM | POA: Diagnosis present

## 2022-08-02 DIAGNOSIS — Z961 Presence of intraocular lens: Secondary | ICD-10-CM | POA: Diagnosis present

## 2022-08-02 DIAGNOSIS — J441 Chronic obstructive pulmonary disease with (acute) exacerbation: Secondary | ICD-10-CM | POA: Diagnosis present

## 2022-08-02 DIAGNOSIS — N401 Enlarged prostate with lower urinary tract symptoms: Secondary | ICD-10-CM | POA: Diagnosis present

## 2022-08-02 DIAGNOSIS — J189 Pneumonia, unspecified organism: Secondary | ICD-10-CM | POA: Diagnosis present

## 2022-08-02 DIAGNOSIS — G3184 Mild cognitive impairment, so stated: Secondary | ICD-10-CM | POA: Diagnosis present

## 2022-08-02 DIAGNOSIS — Z8673 Personal history of transient ischemic attack (TIA), and cerebral infarction without residual deficits: Secondary | ICD-10-CM

## 2022-08-02 DIAGNOSIS — Z9049 Acquired absence of other specified parts of digestive tract: Secondary | ICD-10-CM

## 2022-08-02 DIAGNOSIS — N1831 Chronic kidney disease, stage 3a: Secondary | ICD-10-CM | POA: Diagnosis present

## 2022-08-02 DIAGNOSIS — J309 Allergic rhinitis, unspecified: Secondary | ICD-10-CM | POA: Diagnosis present

## 2022-08-02 DIAGNOSIS — R569 Unspecified convulsions: Secondary | ICD-10-CM | POA: Diagnosis present

## 2022-08-02 DIAGNOSIS — H35371 Puckering of macula, right eye: Secondary | ICD-10-CM | POA: Diagnosis present

## 2022-08-02 DIAGNOSIS — R0602 Shortness of breath: Secondary | ICD-10-CM | POA: Diagnosis not present

## 2022-08-02 DIAGNOSIS — Z9981 Dependence on supplemental oxygen: Secondary | ICD-10-CM

## 2022-08-02 DIAGNOSIS — N400 Enlarged prostate without lower urinary tract symptoms: Secondary | ICD-10-CM | POA: Diagnosis not present

## 2022-08-02 DIAGNOSIS — E8729 Other acidosis: Secondary | ICD-10-CM | POA: Diagnosis present

## 2022-08-02 DIAGNOSIS — Z20822 Contact with and (suspected) exposure to covid-19: Secondary | ICD-10-CM | POA: Diagnosis present

## 2022-08-02 DIAGNOSIS — E785 Hyperlipidemia, unspecified: Secondary | ICD-10-CM | POA: Diagnosis present

## 2022-08-02 DIAGNOSIS — Z87891 Personal history of nicotine dependence: Secondary | ICD-10-CM

## 2022-08-02 DIAGNOSIS — Z87898 Personal history of other specified conditions: Secondary | ICD-10-CM

## 2022-08-02 DIAGNOSIS — Z8719 Personal history of other diseases of the digestive system: Secondary | ICD-10-CM

## 2022-08-02 DIAGNOSIS — Z79899 Other long term (current) drug therapy: Secondary | ICD-10-CM

## 2022-08-02 DIAGNOSIS — J9621 Acute and chronic respiratory failure with hypoxia: Secondary | ICD-10-CM | POA: Diagnosis present

## 2022-08-02 DIAGNOSIS — I5032 Chronic diastolic (congestive) heart failure: Secondary | ICD-10-CM | POA: Diagnosis present

## 2022-08-02 DIAGNOSIS — J431 Panlobular emphysema: Secondary | ICD-10-CM | POA: Diagnosis present

## 2022-08-02 DIAGNOSIS — J9601 Acute respiratory failure with hypoxia: Secondary | ICD-10-CM | POA: Diagnosis not present

## 2022-08-02 DIAGNOSIS — H353221 Exudative age-related macular degeneration, left eye, with active choroidal neovascularization: Secondary | ICD-10-CM | POA: Diagnosis present

## 2022-08-02 DIAGNOSIS — Z7982 Long term (current) use of aspirin: Secondary | ICD-10-CM

## 2022-08-02 DIAGNOSIS — J159 Unspecified bacterial pneumonia: Secondary | ICD-10-CM | POA: Diagnosis present

## 2022-08-02 LAB — BASIC METABOLIC PANEL
Anion gap: 17 — ABNORMAL HIGH (ref 5–15)
BUN: 24 mg/dL — ABNORMAL HIGH (ref 8–23)
CO2: 18 mmol/L — ABNORMAL LOW (ref 22–32)
Calcium: 8.6 mg/dL — ABNORMAL LOW (ref 8.9–10.3)
Chloride: 103 mmol/L (ref 98–111)
Creatinine, Ser: 1.43 mg/dL — ABNORMAL HIGH (ref 0.61–1.24)
GFR, Estimated: 49 mL/min — ABNORMAL LOW (ref >60)
Glucose, Bld: 143 mg/dL — ABNORMAL HIGH (ref 70–99)
Potassium: 4.4 mmol/L (ref 3.5–5.1)
Sodium: 138 mmol/L (ref 135–145)

## 2022-08-02 LAB — RESP PANEL BY RT-PCR (RSV, FLU A&B, COVID)  RVPGX2
Influenza A by PCR: NEGATIVE
Influenza B by PCR: NEGATIVE
Resp Syncytial Virus by PCR: NEGATIVE
SARS Coronavirus 2 by RT PCR: NEGATIVE

## 2022-08-02 LAB — CBC
HCT: 46.3 % (ref 39.0–52.0)
Hemoglobin: 14.2 g/dL (ref 13.0–17.0)
MCH: 27.2 pg (ref 26.0–34.0)
MCHC: 30.7 g/dL (ref 30.0–36.0)
MCV: 88.5 fL (ref 80.0–100.0)
Platelets: 243 10*3/uL (ref 150–400)
RBC: 5.23 MIL/uL (ref 4.22–5.81)
RDW: 14.7 % (ref 11.5–15.5)
WBC: 19.1 10*3/uL — ABNORMAL HIGH (ref 4.0–10.5)
nRBC: 0 % (ref 0.0–0.2)

## 2022-08-02 LAB — I-STAT VENOUS BLOOD GAS, ED
Acid-base deficit: 6 mmol/L — ABNORMAL HIGH (ref 0.0–2.0)
Bicarbonate: 21.2 mmol/L (ref 20.0–28.0)
Calcium, Ion: 1.06 mmol/L — ABNORMAL LOW (ref 1.15–1.40)
HCT: 46 % (ref 39.0–52.0)
Hemoglobin: 15.6 g/dL (ref 13.0–17.0)
O2 Saturation: 71 %
Potassium: 4.3 mmol/L (ref 3.5–5.1)
Sodium: 136 mmol/L (ref 135–145)
TCO2: 23 mmol/L (ref 22–32)
pCO2, Ven: 46.5 mmHg (ref 44–60)
pH, Ven: 7.267 (ref 7.25–7.43)
pO2, Ven: 42 mmHg (ref 32–45)

## 2022-08-02 LAB — TROPONIN I (HIGH SENSITIVITY)
Troponin I (High Sensitivity): 14 ng/L (ref <18)
Troponin I (High Sensitivity): 63 ng/L — ABNORMAL HIGH (ref <18)

## 2022-08-02 LAB — MAGNESIUM: Magnesium: 2.1 mg/dL (ref 1.7–2.4)

## 2022-08-02 MED ORDER — ACETAMINOPHEN 650 MG RE SUPP: 650.0000 mg | Freq: Four times a day (QID) | RECTAL | Status: DC | PRN

## 2022-08-02 MED ORDER — IPRATROPIUM BROMIDE 0.02 % IN SOLN
0.5000 mg | Freq: Once | RESPIRATORY_TRACT | Status: AC
  Administered 2022-08-02: 0.5 mg via RESPIRATORY_TRACT
  Filled 2022-08-02: qty 2.5

## 2022-08-02 MED ORDER — MELATONIN 3 MG PO TABS: 3.0000 mg | ORAL_TABLET | Freq: Every evening | ORAL | Status: DC | PRN

## 2022-08-02 MED ORDER — ACETAMINOPHEN 325 MG PO TABS: 650.0000 mg | ORAL_TABLET | Freq: Four times a day (QID) | ORAL | Status: DC | PRN

## 2022-08-02 MED ORDER — LEVALBUTEROL HCL 1.25 MG/0.5ML IN NEBU: 1.2500 mg | INHALATION_SOLUTION | RESPIRATORY_TRACT | Status: DC | PRN

## 2022-08-02 MED ORDER — METHYLPREDNISOLONE SODIUM SUCC 125 MG IJ SOLR
125.0000 mg | Freq: Once | INTRAMUSCULAR | Status: AC
  Administered 2022-08-02: 125 mg via INTRAVENOUS
  Filled 2022-08-02: qty 2

## 2022-08-02 MED ORDER — SODIUM CHLORIDE 0.9 % IV SOLN
1.0000 g | INTRAVENOUS | Status: DC
  Administered 2022-08-02: 1 g via INTRAVENOUS
  Filled 2022-08-02: qty 10

## 2022-08-02 MED ORDER — LEVALBUTEROL HCL 1.25 MG/0.5ML IN NEBU: 1.2500 mg | INHALATION_SOLUTION | Freq: Four times a day (QID) | RESPIRATORY_TRACT | Status: DC

## 2022-08-02 MED ORDER — IPRATROPIUM BROMIDE 0.02 % IN SOLN
0.5000 mg | Freq: Four times a day (QID) | RESPIRATORY_TRACT | Status: DC
  Administered 2022-08-03 (×2): 0.5 mg via RESPIRATORY_TRACT
  Filled 2022-08-02 (×2): qty 2.5

## 2022-08-02 MED ORDER — ALBUTEROL SULFATE (2.5 MG/3ML) 0.083% IN NEBU
10.0000 mg/h | INHALATION_SOLUTION | Freq: Once | RESPIRATORY_TRACT | Status: AC
  Administered 2022-08-02: 10 mg/h via RESPIRATORY_TRACT
  Filled 2022-08-02: qty 2
  Filled 2022-08-02: qty 12

## 2022-08-02 MED ORDER — MAGNESIUM SULFATE 2 GM/50ML IV SOLN
2.0000 g | Freq: Once | INTRAVENOUS | Status: AC
  Administered 2022-08-02: 2 g via INTRAVENOUS
  Filled 2022-08-02: qty 50

## 2022-08-02 MED ORDER — SODIUM CHLORIDE 0.9 % IV SOLN
500.0000 mg | INTRAVENOUS | Status: DC
  Administered 2022-08-02: 500 mg via INTRAVENOUS
  Filled 2022-08-02: qty 5

## 2022-08-02 MED ORDER — LEVALBUTEROL HCL 1.25 MG/0.5ML IN NEBU
1.2500 mg | INHALATION_SOLUTION | Freq: Four times a day (QID) | RESPIRATORY_TRACT | Status: DC
  Administered 2022-08-03 (×2): 1.25 mg via RESPIRATORY_TRACT
  Filled 2022-08-02 (×2): qty 0.5

## 2022-08-02 MED ORDER — METHYLPREDNISOLONE SODIUM SUCC 125 MG IJ SOLR
80.0000 mg | Freq: Two times a day (BID) | INTRAMUSCULAR | Status: DC
  Administered 2022-08-03 – 2022-08-07 (×9): 80 mg via INTRAVENOUS
  Filled 2022-08-02 (×9): qty 2

## 2022-08-02 NOTE — ED Provider Triage Note (Signed)
Emergency Medicine Provider Triage Evaluation Note  [name] , a 81 y.o. male  was evaluated in triage.  Pt complains of progressively worsening shortness of breath for the last several days, recently admitted for COPD/emphysema exacerbation, discharged on 12/6.  Patient reports that he is no longer taking any steroids, denies any albuterol usage prior to arrival.  He denies any significant cough, chest pain, fever, chills.  He reports that he feels like his heart is beating quite quickly.  Patient with tachycardia on arrival, heart rate in the 130s to 140s, he is hypoxic with oxygen around 76 on home 6 L, he was placed on nonrebreather with improvement to 90% oxygen saturation.  Review of Systems  Positive: Shortness of breath Negative: Chest pain, cough, fever  Physical Exam  BP 99/63 (BP Location: Right Arm)   Pulse (!) 138   Resp 20   SpO2 90%  Gen:   Awake, no distress   Resp:  increased effort  MSK:   Moves extremities without difficulty  Other:  Increased resp effort without significant wheezing, quite tachycardic  Medical Decision Making  Medically screening exam initiated at 8:09 PM.  Appropriate orders placed.  Frank Meyer was informed that the remainder of the evaluation will be completed by another provider, this initial triage assessment does not replace that evaluation, and the importance of remaining in the ED until their evaluation is complete.  Workup initiated   Frank Meyer 08/02/22 2013

## 2022-08-02 NOTE — ED Provider Notes (Signed)
[facility] EMERGENCY DEPARTMENT Provider Note   CSN: [id number] Arrival date & time: 08/02/22  1957     History  Chief Complaint  Patient presents with   Shortness of Breath    Frank Meyer is a 81 y.o. male.  81 year old male presents with increased shortness of breath.  Patient has history of COPD and is chronically on home oxygen at between 5 and 6 L.  Was discharged from the hospital 11 days ago after admission for COPD exacerbation.  Notes he has had some rigors.  He denies any fevers.  Feels like he is weak and his heart is been racing.  Symptoms are exertional.  Denies any anginal type chest pain.       Home Medications Prior to Admission medications   Medication Sig Start Date End Date Taking? Authorizing Provider  ANORO ELLIPTA 62.5-25 MCG/ACT AEPB Inhale 1 puff into the lungs daily. 06/08/22   [provider]  aspirin 81 MG EC tablet Take 81 mg by mouth daily.    [provider]  atorvastatin (LIPITOR) 20 MG tablet Take 20 mg by mouth daily.    [provider]  cholecalciferol (VITAMIN D) 1000 UNITS tablet Take 1,000 Units by mouth daily.    [provider]  donepezil (ARICEPT) 10 MG tablet Take 1 tablet (10 mg total) by mouth daily. Patient taking differently: Take 10 mg by mouth at bedtime. 02/03/22   Suzzanne Cloud, NP  doxycycline (VIBRA-TABS) 100 MG tablet Take 1 tablet (100 mg total) by mouth every 12 (twelve) hours. 07/22/22   Oswald Hillock, MD  fexofenadine (ALLEGRA) 180 MG tablet Take 180 mg by mouth daily.    [provider]  finasteride (PROSCAR) 5 MG tablet Take 5 mg by mouth at bedtime.    [provider]  guaiFENesin (MUCINEX) 600 MG 12 hr tablet Take 1 tablet (600 mg total) by mouth 2 (two) times daily. 07/22/22   Oswald Hillock, MD  ipratropium (ATROVENT) 0.06 % nasal spray Place 1-2 sprays into both nostrils See admin instructions. 2 sprays in the right nostril and 1 spray in the  left nostril once daily 02/09/13   [provider]  levETIRAcetam (KEPPRA) 250 MG tablet Take 1-2 tablets (250-500 mg total) by mouth See admin instructions. Take 250 mg by mouth in the morning and 500 mg by mouth at night. 07/30/22 10/28/22  Alric Ran, MD  Melatonin 10 MG TABS Take 20 mg by mouth at bedtime.    [provider]  nitroGLYCERIN (NITROSTAT) 0.4 MG SL tablet Place 1 tablet (0.4 mg total) under the tongue every 5 (five) minutes as needed. Chest pain Patient taking differently: Place 0.4 mg under the tongue every 5 (five) minutes as needed for chest pain. Chest pain 07/15/21 07/21/22  Park Liter, MD  omeprazole (PRILOSEC) 40 MG capsule Take 40 mg by mouth daily.  03/29/13   [provider]  OXYGEN Inhale 2-5 L into the lungs as directed. 5lt during the day and 2lt at night    [provider]  predniSONE (DELTASONE) 10 MG tablet Prednisone 40 mg po daily x 2 day then Prednisone 30 mg po daily x 2 day then Prednisone 20 mg po daily x 2 day then Prednisone 10 mg daily x 2 day then stop... 07/22/22   Oswald Hillock, MD  sacubitril-valsartan (ENTRESTO) 24-26 MG Take 1 tablet by mouth daily. Patient taking differently: Take 1 tablet by mouth every evening. 02/20/22  Park Liter, MD  thiamine 100 MG tablet Take 1 tablet (100 mg total) by mouth daily. 12/13/18   Geradine Girt, DO  traZODone (DESYREL) 100 MG tablet Take 2 tablets (200 mg total) by mouth at bedtime. 02/03/22   Suzzanne Cloud, NP      Allergies    Patient has no known allergies.    Review of Systems   Review of Systems  All other systems reviewed and are negative.   Physical Exam Updated Vital Signs BP 98/69   Pulse (!) 106   Temp 98.6 F (37 C) (Oral)   Resp 18   SpO2 97%  Physical Exam Vitals and nursing note reviewed.  Constitutional:      General: He is not in acute distress.    Appearance: Normal appearance. He is well-developed. He is not toxic-appearing.   HENT:     Head: Normocephalic and atraumatic.  Eyes:     General: Lids are normal.     Conjunctiva/sclera: Conjunctivae normal.     Pupils: Pupils are equal, round, and reactive to light.  Neck:     Thyroid: No thyroid mass.     Trachea: No tracheal deviation.  Cardiovascular:     Rate and Rhythm: Regular rhythm. Tachycardia present.     Heart sounds: Normal heart sounds. No murmur heard.    No gallop.  Pulmonary:     Effort: Tachypnea and respiratory distress present.     Breath sounds: No stridor. Examination of the right-upper field reveals decreased breath sounds. Examination of the left-upper field reveals decreased breath sounds. Decreased breath sounds present. No wheezing, rhonchi or rales.  Abdominal:     General: There is no distension.     Palpations: Abdomen is soft.     Tenderness: There is no abdominal tenderness. There is no rebound.  Musculoskeletal:        General: No tenderness. Normal range of motion.     Cervical back: Normal range of motion and neck supple.  Skin:    General: Skin is warm and dry.     Findings: No abrasion or rash.  Neurological:     Mental Status: He is alert and oriented to person, place, and time. Mental status is at baseline.     GCS: GCS eye subscore is 4. GCS verbal subscore is 5. GCS motor subscore is 6.     Cranial Nerves: No cranial nerve deficit.     Sensory: No sensory deficit.     Motor: Motor function is intact.  Psychiatric:        Attention and Perception: Attention normal.        Speech: Speech normal.        Behavior: Behavior normal.     ED Results / Procedures / Treatments   Labs (all labs ordered are listed, but only abnormal results are displayed) Labs Reviewed  CBC - Abnormal; Notable for the following components:      Result Value   WBC 19.1 (*)    All other components within normal limits  BASIC METABOLIC PANEL - Abnormal; Notable for the following components:   CO2 18 (*)    Glucose, Bld 143 (*)    BUN  24 (*)    Creatinine, Ser 1.43 (*)    Calcium 8.6 (*)    GFR, Estimated 49 (*)    Anion gap 17 (*)    All other components within normal limits  I-STAT VENOUS BLOOD GAS, ED - Abnormal; Notable for  the following components:   Acid-base deficit 6.0 (*)    Calcium, Ion 1.06 (*)    All other components within normal limits  RESP PANEL BY RT-PCR (RSV, FLU A&B, COVID)  RVPGX2  TROPONIN I (HIGH SENSITIVITY)    EKG EKG Interpretation  Date/Time:  Sunday August 02 2022 20:14:58 EST Ventricular Rate:  136 PR Interval:  128 QRS Duration: 74 QT Interval:  278 QTC Calculation: 418 R Axis:   122 Text Interpretation: Undetermined rhythm Possible Right ventricular hypertrophy Cannot rule out Inferior infarct , age undetermined ST & T wave abnormality, consider anterolateral ischemia Abnormal ECG When compared with ECG of 20-Jul-2022 11:27, PREVIOUS ECG IS PRESENT Confirmed by Lacretia Leigh ([id number]) on 08/02/2022 9:39:03 PM  Radiology DG Chest 1 View  Result Date: 08/02/2022 CLINICAL DATA:  Shortness of breath EXAM: CHEST  1 VIEW COMPARISON:  Chest x-ray 07/20/2022.  Chest CT 07/20/2022. FINDINGS: The heart is enlarged. Aorta is ectatic, unchanged. There are areas of scarring bilaterally which appears similar to the prior study. Bullous change again noted in the left lung apex. There some patchy airspace opacities in the left mid lung, mildly increased. There is no pleural effusion or pneumothorax. No acute fractures are seen. There surgical clips in the upper abdomen. IMPRESSION: 1. Patchy airspace opacities in the left mid lung, mildly increased since the prior study. This could be infectious or inflammatory. 2. Stable cardiomegaly. 3. Stable areas of scarring bilaterally. Electronically Signed   By: Ronney Asters M.D.   On: 08/02/2022 21:17    Procedures Procedures    Medications Ordered in ED Medications - No data to display  ED Course/ Medical Decision Making/ A&P                            Medical Decision Making  Patient is EKG per interpretation shows sinus tachycardia.  Some PVCs noted.  Essentially unchanged from prior.  Chest x-ray prominent rotation shows evidence of pneumonia.  Patient will be started on albuterol, Solu-Medrol give magnesium.  Will also start IV antibiotics for presumptive commune acquired pneumonia.  Patient does have significant leukocytosis on his CBC but does not appear to be septic at this time.  Patient had venous blood gas which shows no severe evidence of hypercapnia.  Patient initially was on nonrebreather and was very tachypneic but he is now on 6 L nasal cannula.  His COVID and flu test were negative..  Patient's troponin is negative.  Low suspicion for ACS.  Discussed the relative at bedside and plan will be for admission.  Will consult hospitalist team  CRITICAL CARE Performed by: Leota Jacobsen Total critical care time: 50 minutes Critical care time was exclusive of separately billable procedures and treating other patients. Critical care was necessary to treat or prevent imminent or life-threatening deterioration. Critical care was time spent personally by me on the following activities: development of treatment plan with patient and/or surrogate as well as nursing, discussions with consultants, evaluation of patient's response to treatment, examination of patient, obtaining history from patient or surrogate, ordering and performing treatments and interventions, ordering and review of laboratory studies, ordering and review of radiographic studies, pulse oximetry and re-evaluation of patient's condition.         Final Clinical Impression(s) / ED Diagnoses Final diagnoses:  None    Rx / DC Orders ED Discharge Orders     None         Lacretia Leigh,  MD 08/02/22 2141

## 2022-08-02 NOTE — H&P (Signed)
History and Physical      Frank Meyer DOB: October 15, 1940 DOA: 08/02/2022  PCP: Burnard Bunting, MD *** Patient coming from: home ***  I have personally briefly reviewed patient's old medical records in [place]  Chief Complaint: ***  HPI: Frank Meyer is a 81 y.o. male with medical history significant for *** who is admitted to [facility] on 08/02/2022 with *** after presenting from home*** to [facility] ED complaining of ***.    ***       ***   ED Course:  Vital signs in the ED were notable for the following: ***  Labs were notable for the following: ***  Per my interpretation, EKG in ED demonstrated the following:  ***  Imaging and additional notable ED work-up: ***  While in the ED, the following were administered: ***  Subsequently, the patient was admitted  ***  ***red    Review of Systems: As per HPI otherwise 10 point review of systems negative.   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   Chronic respiratory failure ([place]) 10/04/2020   Coronary artery calcification seen on CAT scan 05/28/2020   Dyslipidemia    Dyspnea on exertion 03/18/2018   Emphysema lung ([place]) 03/18/2018   Encounter for general adult medical examination without abnormal findings 01/17/2015   Ex-cigarette smoker 01/20/2021   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/18/2019   Gallbladder problem    Gastroesophageal reflux disease    Hiatal hernia    Hilar adenopathy 03/25/2018   History of smoking 03/18/2018   Hyperlipidemia 10/24/2009   Insomnia 05/09/2013    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   Internal hemorrhoid 10/04/2020   Macular degeneration    Macular pucker, right eye 04/30/2020   Mediastinal adenopathy 03/25/2018   Memory loss 05/09/2013   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   Mild memory disturbance    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   Noninfective gastroenteritis and colitis, unspecified 10/04/2020   Nuclear sclerotic cataract of right eye 12/18/2019   Other specified symptoms and signs involving the digestive system and abdomen 10/04/2020   Overweight 09/14/2018   Oxygen dependent 10/04/2020   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 His smoking use included cigarettes. He has a 50.00 pack-year smoking history. He has never used smokeless tobacco. He reports current alcohol use of about 15.0 - 20.0 standard drinks of alcohol per week. He reports that he does not use drugs.   No Known Allergies  Family History  Problem Relation Age of Onset   Obesity Mother  Memory loss Mother     Family history reviewed and not pertinent ***   Prior to Admission medications   Medication Sig Start Date End Date Taking? Authorizing Provider  ANORO ELLIPTA 62.5-25 MCG/ACT AEPB Inhale 1 puff into the lungs daily. 06/08/22   [provider]  aspirin 81 MG EC tablet Take 81 mg by mouth daily.    [provider]   atorvastatin (LIPITOR) 20 MG tablet Take 20 mg by mouth daily.    [provider]  cholecalciferol (VITAMIN D) 1000 UNITS tablet Take 1,000 Units by mouth daily.    [provider]  donepezil (ARICEPT) 10 MG tablet Take 1 tablet (10 mg total) by mouth daily. Patient taking differently: Take 10 mg by mouth at bedtime. 02/03/22   Suzzanne Cloud, NP  doxycycline (VIBRA-TABS) 100 MG tablet Take 1 tablet (100 mg total) by mouth every 12 (twelve) hours. 07/22/22   Oswald Hillock, MD  fexofenadine (ALLEGRA) 180 MG tablet Take 180 mg by mouth daily.    [provider]  finasteride (PROSCAR) 5 MG tablet Take 5 mg by mouth at bedtime.    [provider]  guaiFENesin (MUCINEX) 600 MG 12 hr tablet Take 1 tablet (600 mg total) by mouth 2 (two) times daily. 07/22/22   Oswald Hillock, MD  ipratropium (ATROVENT) 0.06 % nasal spray Place 1-2 sprays into both nostrils See admin instructions. 2 sprays in the right nostril and 1 spray in the left nostril once daily 02/09/13   [provider]  levETIRAcetam (KEPPRA) 250 MG tablet Take 1-2 tablets (250-500 mg total) by mouth See admin instructions. Take 250 mg by mouth in the morning and 500 mg by mouth at night. 07/30/22 10/28/22  Alric Ran, MD  Melatonin 10 MG TABS Take 20 mg by mouth at bedtime.    [provider]  nitroGLYCERIN (NITROSTAT) 0.4 MG SL tablet Place 1 tablet (0.4 mg total) under the tongue every 5 (five) minutes as needed. Chest pain Patient taking differently: Place 0.4 mg under the tongue every 5 (five) minutes as needed for chest pain. Chest pain 07/15/21 07/21/22  Park Liter, MD  omeprazole (PRILOSEC) 40 MG capsule Take 40 mg by mouth daily.  03/29/13   [provider]  OXYGEN Inhale 2-5 L into the lungs as directed. 5lt during the day and 2lt at night    [provider]  predniSONE (DELTASONE) 10 MG tablet Prednisone 40 mg po daily x 2 day then Prednisone 30 mg po daily x 2  day then Prednisone 20 mg po daily x 2 day then Prednisone 10 mg daily x 2 day then stop... 07/22/22   Oswald Hillock, MD  sacubitril-valsartan (ENTRESTO) 24-26 MG Take 1 tablet by mouth daily. Patient taking differently: Take 1 tablet by mouth every evening. 02/20/22   Park Liter, MD  thiamine 100 MG tablet Take 1 tablet (100 mg total) by mouth daily. 12/13/18   Geradine Girt, DO  traZODone (DESYREL) 100 MG tablet Take 2 tablets (200 mg total) by mouth at bedtime. 02/03/22   Suzzanne Cloud, NP     Objective    Physical Exam: Vitals:   08/02/22 2008 08/02/22 2008 08/02/22 2129  BP:  99/63 98/69  Pulse:  (!) 138 (!) 106  Resp:  20 18  Temp:   98.6 F (37 C)  TempSrc:   Oral  SpO2: 90% 90% 97%    General: appears to be stated age; alert,  oriented Skin: warm, dry, no rash Head:  AT/[place] Mouth:  Oral mucosa membranes appear moist, normal dentition Neck: supple; trachea midline Heart:  RRR; did not appreciate any M/R/G Lungs: CTAB, did not appreciate any wheezes, rales, or rhonchi Abdomen: + BS; soft, ND, NT Vascular: 2+ pedal pulses b/l; 2+ radial pulses b/l Extremities: no peripheral edema, no muscle wasting Neuro: strength and sensation intact in upper and lower extremities b/l ***   *** Neuro: 5/5 strength of the proximal and distal flexors and extensors of the upper and lower extremities bilaterally; sensation intact in upper and lower extremities b/l; cranial nerves II through XII grossly intact; no pronator drift; no evidence suggestive of slurred speech, dysarthria, or facial droop; Normal muscle tone. No tremors.  *** Neuro: In the setting of the patient's current mental status and associated inability to follow instructions, unable to perform full neurologic exam at this time.  As such, assessment of strength, sensation, and cranial nerves is limited at this time. Patient noted to spontaneously move all 4 extremities. No tremors.  ***    Labs on Admission: I have  personally reviewed following labs and imaging studies  CBC: Recent Labs  Lab 08/02/22 2007 08/02/22 2043  WBC 19.1*  --   HGB 14.2 15.6  HCT 46.3 46.0  MCV 88.5  --   PLT 243  --    Basic Metabolic Panel: Recent Labs  Lab 08/02/22 2007 08/02/22 2043  NA 138 136  K 4.4 4.3  CL 103  --   CO2 18*  --   GLUCOSE 143*  --   BUN 24*  --   CREATININE 1.43*  --   CALCIUM 8.6*  --    GFR: Estimated Creatinine Clearance: 39.5 mL/min (A) (by C-G formula based on SCr of 1.43 mg/dL (H)). Liver Function Tests: No results for input(s): "AST", "ALT", "ALKPHOS", "BILITOT", "PROT", "ALBUMIN" in the last 168 hours. No results for input(s): "LIPASE", "AMYLASE" in the last 168 hours. No results for input(s): "AMMONIA" in the last 168 hours. Coagulation Profile: No results for input(s): "INR", "PROTIME" in the last 168 hours. Cardiac Enzymes: No results for input(s): "CKTOTAL", "CKMB", "CKMBINDEX", "TROPONINI" in the last 168 hours. BNP (last 3 results) No results for input(s): "PROBNP" in the last 8760 hours. HbA1C: No results for input(s): "HGBA1C" in the last 72 hours. CBG: No results for input(s): "GLUCAP" in the last 168 hours. Lipid Profile: No results for input(s): "CHOL", "HDL", "LDLCALC", "TRIG", "CHOLHDL", "LDLDIRECT" in the last 72 hours. Thyroid Function Tests: No results for input(s): "TSH", "T4TOTAL", "FREET4", "T3FREE", "THYROIDAB" in the last 72 hours. Anemia Panel: No results for input(s): "VITAMINB12", "FOLATE", "FERRITIN", "TIBC", "IRON", "RETICCTPCT" in the last 72 hours. Urine analysis:    Component Value Date/Time   COLORURINE YELLOW 07/21/2022 1148   APPEARANCEUR CLEAR 07/21/2022 1148   LABSPEC 1.010 07/21/2022 1148   PHURINE 5.0 07/21/2022 1148   GLUCOSEU NEGATIVE 07/21/2022 1148   HGBUR NEGATIVE 07/21/2022 [place] 07/21/2022 [place] 07/21/2022 1148   PROTEINUR NEGATIVE 07/21/2022 1148   NITRITE NEGATIVE 07/21/2022  1148   LEUKOCYTESUR NEGATIVE 07/21/2022 1148    Radiological Exams on Admission: DG Chest 1 View  Result Date: 08/02/2022 CLINICAL DATA:  Shortness of breath EXAM: CHEST  1 VIEW COMPARISON:  Chest x-ray 07/20/2022.  Chest CT 07/20/2022. FINDINGS: The heart is enlarged. Aorta is ectatic, unchanged. There are areas of scarring bilaterally which appears similar to the prior study. Bullous change again noted  in the left lung apex. There some patchy airspace opacities in the left mid lung, mildly increased. There is no pleural effusion or pneumothorax. No acute fractures are seen. There surgical clips in the upper abdomen. IMPRESSION: 1. Patchy airspace opacities in the left mid lung, mildly increased since the prior study. This could be infectious or inflammatory. 2. Stable cardiomegaly. 3. Stable areas of scarring bilaterally. Electronically Signed   By: Ronney Asters M.D.   On: 08/02/2022 21:17      Assessment/Plan   Principal Problem:   Acute exacerbation of chronic obstructive pulmonary disease (COPD) (HCC)   ***       ***            ***             ***            ***            ***            ***             ***           ***           ***   ***  DVT prophylaxis: SCD's ***  Code Status: Full code*** Family Communication: none*** Disposition Plan: Per Rounding Team Consults called: none***;  Admission status: ***     I SPENT GREATER THAN 75 *** MINUTES IN CLINICAL CARE TIME/MEDICAL DECISION-MAKING IN COMPLETING THIS ADMISSION.      Merriman DO [facility]  From [place]   08/02/2022, 10:05 PM   ***

## 2022-08-02 NOTE — ED Triage Notes (Signed)
Patient reports worsening SOB /chest tightness for several days , denies chest pain , no fever or chills , uses O2 5 lpm/[place] 24/7.

## 2022-08-03 ENCOUNTER — Encounter (HOSPITAL_COMMUNITY): Payer: Self-pay | Admitting: Internal Medicine

## 2022-08-03 ENCOUNTER — Inpatient Hospital Stay (HOSPITAL_COMMUNITY): Payer: Medicare Other

## 2022-08-03 DIAGNOSIS — J9621 Acute and chronic respiratory failure with hypoxia: Secondary | ICD-10-CM

## 2022-08-03 DIAGNOSIS — R0602 Shortness of breath: Secondary | ICD-10-CM

## 2022-08-03 DIAGNOSIS — J159 Unspecified bacterial pneumonia: Secondary | ICD-10-CM | POA: Diagnosis present

## 2022-08-03 DIAGNOSIS — N1831 Chronic kidney disease, stage 3a: Secondary | ICD-10-CM | POA: Insufficient documentation

## 2022-08-03 DIAGNOSIS — R7989 Other specified abnormal findings of blood chemistry: Secondary | ICD-10-CM

## 2022-08-03 DIAGNOSIS — J441 Chronic obstructive pulmonary disease with (acute) exacerbation: Secondary | ICD-10-CM | POA: Diagnosis not present

## 2022-08-03 DIAGNOSIS — E8729 Other acidosis: Secondary | ICD-10-CM | POA: Diagnosis present

## 2022-08-03 DIAGNOSIS — Z87898 Personal history of other specified conditions: Secondary | ICD-10-CM

## 2022-08-03 DIAGNOSIS — N179 Acute kidney failure, unspecified: Secondary | ICD-10-CM | POA: Insufficient documentation

## 2022-08-03 DIAGNOSIS — J189 Pneumonia, unspecified organism: Secondary | ICD-10-CM | POA: Diagnosis present

## 2022-08-03 HISTORY — DX: Other specified abnormal findings of blood chemistry: R79.89

## 2022-08-03 HISTORY — DX: Acute kidney failure, unspecified: N17.9

## 2022-08-03 HISTORY — DX: Personal history of other specified conditions: Z87.898

## 2022-08-03 LAB — BLOOD GAS, VENOUS
Acid-Base Excess: 4.4 mmol/L — ABNORMAL HIGH (ref 0.0–2.0)
Bicarbonate: 29.2 mmol/L — ABNORMAL HIGH (ref 20.0–28.0)
Drawn by: 164
O2 Saturation: 32.6 %
Patient temperature: 36.6
pCO2, Ven: 42 mmHg — ABNORMAL LOW (ref 44–60)
pH, Ven: 7.45 — ABNORMAL HIGH (ref 7.25–7.43)
pO2, Ven: <31 mmHg — CL (ref 32–45)

## 2022-08-03 LAB — CBC WITH DIFFERENTIAL/PLATELET
Abs Immature Granulocytes: 0.1 10*3/uL — ABNORMAL HIGH (ref 0.00–0.07)
Basophils Absolute: 0 10*3/uL (ref 0.0–0.1)
Basophils Relative: 0 %
Eosinophils Absolute: 0 10*3/uL (ref 0.0–0.5)
Eosinophils Relative: 0 %
HCT: 40.5 % (ref 39.0–52.0)
Hemoglobin: 12.6 g/dL — ABNORMAL LOW (ref 13.0–17.0)
Immature Granulocytes: 1 %
Lymphocytes Relative: 7 %
Lymphs Abs: 0.7 10*3/uL (ref 0.7–4.0)
MCH: 26.7 pg (ref 26.0–34.0)
MCHC: 31.1 g/dL (ref 30.0–36.0)
MCV: 85.8 fL (ref 80.0–100.0)
Monocytes Absolute: 0.1 10*3/uL (ref 0.1–1.0)
Monocytes Relative: 1 %
Neutro Abs: 8.3 10*3/uL — ABNORMAL HIGH (ref 1.7–7.7)
Neutrophils Relative %: 91 %
Platelets: 204 10*3/uL (ref 150–400)
RBC: 4.72 MIL/uL (ref 4.22–5.81)
RDW: 14.8 % (ref 11.5–15.5)
WBC: 9.3 10*3/uL (ref 4.0–10.5)
nRBC: 0 % (ref 0.0–0.2)

## 2022-08-03 LAB — ECHOCARDIOGRAM COMPLETE
Area-P 1/2: 3.95 cm2
Calc EF: 54.9 %
Height: 66 in
P 1/2 time: 763 msec
S' Lateral: 3 cm
Single Plane A2C EF: 33.9 %
Single Plane A4C EF: 65.3 %
Weight: 2560 oz

## 2022-08-03 LAB — COMPREHENSIVE METABOLIC PANEL
ALT: 46 U/L — ABNORMAL HIGH (ref 0–44)
AST: 30 U/L (ref 15–41)
Albumin: 2.8 g/dL — ABNORMAL LOW (ref 3.5–5.0)
Alkaline Phosphatase: 50 U/L (ref 38–126)
Anion gap: 11 (ref 5–15)
BUN: 20 mg/dL (ref 8–23)
CO2: 21 mmol/L — ABNORMAL LOW (ref 22–32)
Calcium: 7.9 mg/dL — ABNORMAL LOW (ref 8.9–10.3)
Chloride: 105 mmol/L (ref 98–111)
Creatinine, Ser: 1.09 mg/dL (ref 0.61–1.24)
GFR, Estimated: >60 mL/min (ref >60)
Glucose, Bld: 199 mg/dL — ABNORMAL HIGH (ref 70–99)
Potassium: 3.9 mmol/L (ref 3.5–5.1)
Sodium: 137 mmol/L (ref 135–145)
Total Bilirubin: 0.6 mg/dL (ref 0.3–1.2)
Total Protein: 5.9 g/dL — ABNORMAL LOW (ref 6.5–8.1)

## 2022-08-03 LAB — BRAIN NATRIURETIC PEPTIDE: B Natriuretic Peptide: 192 pg/mL — ABNORMAL HIGH (ref 0.0–100.0)

## 2022-08-03 LAB — LACTIC ACID, PLASMA
Lactic Acid, Venous: 1.5 mmol/L (ref 0.5–1.9)
Lactic Acid, Venous: 2.7 mmol/L (ref 0.5–1.9)

## 2022-08-03 LAB — PHOSPHORUS: Phosphorus: 3 mg/dL (ref 2.5–4.6)

## 2022-08-03 LAB — PROCALCITONIN: Procalcitonin: <0.1 ng/mL

## 2022-08-03 LAB — PROTIME-INR
INR: 1.1 (ref 0.8–1.2)
Prothrombin Time: 14.2 seconds (ref 11.4–15.2)

## 2022-08-03 LAB — TROPONIN I (HIGH SENSITIVITY): Troponin I (High Sensitivity): 137 ng/L (ref <18)

## 2022-08-03 LAB — MAGNESIUM: Magnesium: 2.4 mg/dL (ref 1.7–2.4)

## 2022-08-03 MED ORDER — LEVETIRACETAM 500 MG PO TABS: 500.0000 mg | ORAL_TABLET | Freq: Every day | ORAL | Status: DC

## 2022-08-03 MED ORDER — IPRATROPIUM-ALBUTEROL 0.5-2.5 (3) MG/3ML IN SOLN
3.0000 mL | Freq: Four times a day (QID) | RESPIRATORY_TRACT | Status: DC
  Administered 2022-08-03 (×2): 3 mL via RESPIRATORY_TRACT
  Filled 2022-08-03 (×3): qty 3

## 2022-08-03 MED ORDER — LORATADINE 10 MG PO TABS
10.0000 mg | ORAL_TABLET | Freq: Every day | ORAL | Status: DC
  Administered 2022-08-03 – 2022-08-07 (×5): 10 mg via ORAL
  Filled 2022-08-03 (×5): qty 1

## 2022-08-03 MED ORDER — VITAMIN D 25 MCG (1000 UNIT) PO TABS
1000.0000 [IU] | ORAL_TABLET | Freq: Every day | ORAL | Status: DC
  Administered 2022-08-03 – 2022-08-07 (×5): 1000 [IU] via ORAL
  Filled 2022-08-03 (×5): qty 1

## 2022-08-03 MED ORDER — SODIUM CHLORIDE 0.9 % IV BOLUS
250.0000 mL | Freq: Once | INTRAVENOUS | Status: AC
  Administered 2022-08-03: 250 mL via INTRAVENOUS

## 2022-08-03 MED ORDER — FINASTERIDE 5 MG PO TABS
5.0000 mg | ORAL_TABLET | Freq: Every day | ORAL | Status: DC
  Administered 2022-08-03 – 2022-08-06 (×4): 5 mg via ORAL
  Filled 2022-08-03 (×4): qty 1

## 2022-08-03 MED ORDER — LEVETIRACETAM 500 MG PO TABS
500.0000 mg | ORAL_TABLET | Freq: Every day | ORAL | Status: DC
  Administered 2022-08-03 – 2022-08-06 (×5): 500 mg via ORAL
  Filled 2022-08-03 (×5): qty 1

## 2022-08-03 MED ORDER — LEVETIRACETAM 250 MG PO TABS: 250.0000 mg | ORAL_TABLET | ORAL | Status: DC

## 2022-08-03 MED ORDER — MELATONIN 5 MG PO TABS
10.0000 mg | ORAL_TABLET | Freq: Every evening | ORAL | Status: DC | PRN
  Administered 2022-08-03 – 2022-08-06 (×3): 10 mg via ORAL
  Filled 2022-08-03 (×3): qty 2

## 2022-08-03 MED ORDER — TRAZODONE HCL 100 MG PO TABS
200.0000 mg | ORAL_TABLET | Freq: Every day | ORAL | Status: DC
  Administered 2022-08-03 – 2022-08-06 (×5): 200 mg via ORAL
  Filled 2022-08-03: qty 2
  Filled 2022-08-03: qty 4
  Filled 2022-08-03 (×3): qty 2

## 2022-08-03 MED ORDER — VANCOMYCIN HCL IN DEXTROSE 1-5 GM/200ML-% IV SOLN: 1000.0000 mg | INTRAVENOUS | Status: DC

## 2022-08-03 MED ORDER — THIAMINE MONONITRATE 100 MG PO TABS
100.0000 mg | ORAL_TABLET | Freq: Every day | ORAL | Status: DC
  Administered 2022-08-03 – 2022-08-07 (×5): 100 mg via ORAL
  Filled 2022-08-03 (×5): qty 1

## 2022-08-03 MED ORDER — SACUBITRIL-VALSARTAN 24-26 MG PO TABS
1.0000 | ORAL_TABLET | Freq: Every evening | ORAL | Status: DC
  Administered 2022-08-03 – 2022-08-06 (×4): 1 via ORAL
  Filled 2022-08-03 (×4): qty 1

## 2022-08-03 MED ORDER — GUAIFENESIN ER 600 MG PO TB12
600.0000 mg | ORAL_TABLET | Freq: Two times a day (BID) | ORAL | Status: DC
  Administered 2022-08-03 – 2022-08-05 (×5): 600 mg via ORAL
  Filled 2022-08-03 (×5): qty 1

## 2022-08-03 MED ORDER — SODIUM CHLORIDE 0.9 % IV SOLN: INTRAVENOUS | Status: DC

## 2022-08-03 MED ORDER — SODIUM CHLORIDE 0.9 % IV SOLN
2.0000 g | Freq: Two times a day (BID) | INTRAVENOUS | Status: DC
  Administered 2022-08-03 – 2022-08-04 (×3): 2 g via INTRAVENOUS
  Filled 2022-08-03 (×3): qty 12.5

## 2022-08-03 MED ORDER — ASPIRIN 81 MG PO TBEC
81.0000 mg | DELAYED_RELEASE_TABLET | Freq: Every day | ORAL | Status: DC
  Administered 2022-08-03 – 2022-08-07 (×5): 81 mg via ORAL
  Filled 2022-08-03 (×5): qty 1

## 2022-08-03 MED ORDER — LEVETIRACETAM 250 MG PO TABS
250.0000 mg | ORAL_TABLET | Freq: Every day | ORAL | Status: DC
  Administered 2022-08-03 – 2022-08-07 (×5): 250 mg via ORAL
  Filled 2022-08-03 (×5): qty 1

## 2022-08-03 MED ORDER — ALBUTEROL SULFATE (2.5 MG/3ML) 0.083% IN NEBU: 2.5000 mg | INHALATION_SOLUTION | RESPIRATORY_TRACT | Status: DC | PRN

## 2022-08-03 MED ORDER — ATORVASTATIN CALCIUM 10 MG PO TABS
20.0000 mg | ORAL_TABLET | Freq: Every day | ORAL | Status: DC
  Administered 2022-08-03 – 2022-08-07 (×5): 20 mg via ORAL
  Filled 2022-08-03 (×5): qty 2

## 2022-08-03 MED ORDER — DONEPEZIL HCL 10 MG PO TABS
10.0000 mg | ORAL_TABLET | Freq: Every day | ORAL | Status: DC
  Administered 2022-08-03 – 2022-08-06 (×4): 10 mg via ORAL
  Filled 2022-08-03 (×4): qty 1

## 2022-08-03 MED ORDER — VANCOMYCIN HCL 1500 MG/300ML IV SOLN
1500.0000 mg | Freq: Once | INTRAVENOUS | Status: AC
  Administered 2022-08-03: 1500 mg via INTRAVENOUS
  Filled 2022-08-03: qty 300

## 2022-08-03 NOTE — ED Notes (Signed)
CRITICAL VALUE STICKER  CRITICAL VALUE:Lactic acid 2.7  RECEIVER (on-site recipient of call):I. Phinneas Shakoor  DATE & TIME NOTIFIED: [phone] 08/03/22  MESSENGER (representative from lab):lab  MD NOTIFIED: [place]  TIME OF NOTIFICATION:0809  RESPONSE:

## 2022-08-03 NOTE — Progress Notes (Signed)
Pharmacy Antibiotic Note  Frank Meyer is a 81 y.o. male admitted on 08/02/2022 with pneumonia.  Pharmacy has been consulted for Vancomycin and Cefepime  dosing.  Plan: Vancomycin 1500 mg IV now, then 1000 mg IV q48h Cefepime 2 g IV q12h  Height: '5\' 6"'$  (167.6 cm) Weight: 72.6 kg (160 lb) IBW/kg (Calculated) : 63.8  Temp (24hrs), Avg:98.6 F (37 C), Min:98.6 F (37 C), Max:98.6 F (37 C)  Recent Labs  Lab 08/02/22 2007  WBC 19.1*  CREATININE 1.43*    Estimated Creatinine Clearance: 36.6 mL/min (A) (by C-G formula based on SCr of 1.43 mg/dL (H)).    No Known Allergies   Caryl Pina 08/03/2022 2:54 AM

## 2022-08-03 NOTE — ED Notes (Signed)
CRITICAL VALUE STICKER  CRITICAL VALUE:Troponin 778  EUMPNTIR (on-site recipient of call):lab  DATE & TIME NOTIFIED: 08/03/22 0948  MESSENGER (representative from lab):  MD NOTIFIED: [place]  TIME OF NOTIFICATION:1001  RESPONSE:

## 2022-08-03 NOTE — Progress Notes (Addendum)
PROGRESS NOTE   Frank Meyer  ZOX:[id number]    DOB: 11-Oct-1940    DOA: 08/02/2022  PCP: Burnard Bunting, MD   I have briefly reviewed patients previous medical records in [facility].  Chief Complaint  Patient presents with   Shortness of Breath    Brief Narrative:  81 y.o. 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  Initially needed nonrebreather mask followed by several hours of Ventimask and then titrated to [place] oxygen 5 L/min.  Admitted for acute on chronic respiratory failure with hypoxia due to COPD exacerbation and pneumonia.  Improving.  Assessment & Plan:  Principal Problem:   Acute exacerbation of chronic obstructive pulmonary disease (COPD) (HCC) Active Problems:   BPH (benign prostatic hyperplasia)   Hyperlipidemia   Chronic diastolic CHF (congestive heart failure) (HCC)   CAP (community acquired pneumonia)   High anion gap metabolic acidosis   Elevated troponin   History of seizures   Stage 3a chronic kidney disease (CKD) (HCC)   Acute on chronic respiratory failure with hypoxia: At baseline patient on 5-6 L/min [place] oxygen.  Former smoker, quit 30 years ago.  Now precipitated by COPD exacerbation and pneumonia, treatment as below.  Titrate oxygen to maintain saturations between 89-92%.  Flutter valve incentive spirometry.  Initially needed nonrebreather mask followed by several hours of Ventimask and then titrated to [place] oxygen 5 L/min.  Lactate  normal.  BNP 192.  Influenza A, B, RSV and SARS coronavirus 2 by RT-PCR negative.  COPD exacerbation: IV Solu-Medrol, scheduled bronchodilator nebs and as needed nebs.  Incentive spirometry and flutter valve.  VBG without CO2 retention. Follows with Dr. Silas Flood, Pulmonology  Pneumonia Recently completed treatment for pneumonia during prior hospitalization.  Concern for CAP versus HCAP.  Thereby antibiotics broadened to IV vancomycin and cefepime.  Could consider stopping azithromycin since he has recently completed an atypical coverage.  Procalcitonin <0.10 x 2, argues against bacterial infectious etiology, reassess in a.m. and consider stopping antibiotics.  Chest x-ray shows patchy airspace disease in the left midlung, worse than prior.  Elevated troponin: HS Troponin up from 14-63-137.  No angina.  Likely due to demand ischemia from acute respiratory failure.  TTE with preserved LVEF, no regional wall motion abnormalities and no changes compared to prior study.  Acute kidney injury complicating stage II CKD: Baseline creatinine appears to be in the 1.1 range.  Presented with creatinine of 1.4.  This is resolved.  Anion gap metabolic acidosis: Likely secondary to acute respiratory failure and AKI.  Resolved.  BPH Continue home finasteride  Chronic diastolic CHF TTE January 8119 with LVEF 50-55% and grade 1 diastolic dysfunction.  Clinically euvolemic.  Hyperlipidemia Continue atorvastatin  History of seizures: Continue Keppra.  Body mass index is 25.82 kg/m.   DVT prophylaxis: SCDs Start: 08/02/22 2200     Code Status: Full Code:  Family Communication: Spouse via phone. Disposition:  Status is: Inpatient Remains inpatient appropriate because: IV meds     Consultants:     Procedures:     Antimicrobials:   As above   Subjective:  Seen this morning while still in ED.  Overall feels much better.  Dyspnea significantly improved and breathing approaching baseline.   Mild intermittent dry cough.  No chest pain.  States that he quit smoking 30 years ago.  Objective:   Vitals:   08/03/22 0333 08/03/22 0500 08/03/22 0600 08/03/22 0745  BP: (!) 98/58 97/60 (!) 99/58   Pulse: 79 84 67   Resp: '16 20 17   '$ Temp: 98.4 F (36.9 C)   98.6 F (37 C)  TempSrc: Oral   Oral  SpO2: 100% 97% 97%   Weight:      Height:        General exam: Elderly male, moderately built and frail lying comfortably propped up in bed without distress.  He still had the Ventimask when I saw him and this was changed to [place] oxygen at 5 L/min by ED RN at bedside. Respiratory system: Slightly harsh breath sounds bilaterally but no wheezing, rhonchi or crackles.  No increased work of breathing. Cardiovascular system: S1 & S2 heard, RRR. No JVD, murmurs, rubs, gallops or clicks. No pedal edema.  Telemetry personally reviewed: Sinus rhythm. Gastrointestinal system: Abdomen is nondistended, soft and nontender. No organomegaly or masses felt. Normal bowel sounds heard. Central nervous system: Alert and oriented. No focal neurological deficits. Extremities: Symmetric 5 x 5 power. Skin: No rashes, lesions or ulcers Psychiatry: Judgement and insight appear normal. Mood & affect appropriate.     Data Reviewed:   I have personally reviewed following labs and imaging studies   CBC: Recent Labs  Lab 08/02/22 2007 08/02/22 2043 08/03/22 0449  WBC 19.1*  --  9.3  NEUTROABS  --   --  8.3*  HGB 14.2 15.6 12.6*  HCT 46.3 46.0 40.5  MCV 88.5  --  85.8  PLT 243  --  678    Basic Metabolic Panel: Recent Labs  Lab 08/02/22 2007 08/02/22 2043 08/02/22 2216 08/03/22 0449  NA 138 136  --  137  K 4.4 4.3  --  3.9  CL 103  --   --  105  CO2 18*  --   --  21*  GLUCOSE 143*  --   --  199*  BUN 24*  --   --  20  CREATININE 1.43*  --   --  1.09  CALCIUM 8.6*  --   --  7.9*  MG  --   --  2.1 2.4  PHOS  --   --   --  3.0    Liver Function Tests: Recent Labs  Lab 08/03/22 0449  AST 30   ALT 46*  ALKPHOS 50  BILITOT 0.6  PROT 5.9*  ALBUMIN 2.8*    CBG: No results for input(s): "GLUCAP" in the last 168 hours.  Microbiology Studies:   Recent Results (from the past 240 hour(s))  Resp panel by RT-PCR (RSV, Flu A&B, Covid) Anterior Nasal Swab     Status: None   Collection Time: 08/02/22  8:07 PM   Specimen: Anterior Nasal Swab  Result Value Ref Range Status   SARS Coronavirus 2 by RT PCR NEGATIVE NEGATIVE Final    Comment: (NOTE) SARS-CoV-2 target nucleic acids are NOT DETECTED.  The SARS-CoV-2 RNA is generally detectable in upper respiratory specimens during the acute phase of infection. The lowest concentration of SARS-CoV-2 viral copies this assay can detect is 138 copies/mL. A negative result does not preclude SARS-Cov-2 infection and should not be used as the sole basis for treatment or  other patient management decisions. A negative result may occur with  improper specimen collection/handling, submission of specimen other than nasopharyngeal swab, presence of viral mutation(s) within the areas targeted by this assay, and inadequate number of viral copies(<138 copies/mL). A negative result must be combined with clinical observations, patient history, and epidemiological information. The expected result is Negative.  Fact Sheet for Patients:  EntrepreneurPulse.com.au  Fact Sheet for Healthcare Providers:  IncredibleEmployment.be  This test is no t yet approved or cleared by the [place] FDA and  has been authorized for detection and/or diagnosis of SARS-CoV-2 by FDA under an Emergency Use Authorization (EUA). This EUA will remain  in effect (meaning this test can be used) for the duration of the COVID-19 declaration under Section 564(b)(1) of the Act, 21 U.S.C.section 360bbb-3(b)(1), unless the authorization is terminated  or revoked sooner.       Influenza A by PCR NEGATIVE NEGATIVE Final   Influenza B by PCR  NEGATIVE NEGATIVE Final    Comment: (NOTE) The Xpert Xpress SARS-CoV-2/FLU/RSV plus assay is intended as an aid in the diagnosis of influenza from Nasopharyngeal swab specimens and should not be used as a sole basis for treatment. Nasal washings and aspirates are unacceptable for Xpert Xpress SARS-CoV-2/FLU/RSV testing.  Fact Sheet for Patients: EntrepreneurPulse.com.au  Fact Sheet for Healthcare Providers: IncredibleEmployment.be  This test is not yet approved or cleared by the [place] FDA and has been authorized for detection and/or diagnosis of SARS-CoV-2 by FDA under an Emergency Use Authorization (EUA). This EUA will remain in effect (meaning this test can be used) for the duration of the COVID-19 declaration under Section 564(b)(1) of the Act, 21 U.S.C. section 360bbb-3(b)(1), unless the authorization is terminated or revoked.     Resp Syncytial Virus by PCR NEGATIVE NEGATIVE Final    Comment: (NOTE) Fact Sheet for Patients: EntrepreneurPulse.com.au  Fact Sheet for Healthcare Providers: IncredibleEmployment.be  This test is not yet approved or cleared by the [place] FDA and has been authorized for detection and/or diagnosis of SARS-CoV-2 by FDA under an Emergency Use Authorization (EUA). This EUA will remain in effect (meaning this test can be used) for the duration of the COVID-19 declaration under Section 564(b)(1) of the Act, 21 U.S.C. section 360bbb-3(b)(1), unless the authorization is terminated or revoked.  Performed at [facility], [place] [address], [place], [place] [address]     Radiology Studies:  DG Chest 1 View  Result Date: 08/02/2022 CLINICAL DATA:  Shortness of breath EXAM: CHEST  1 VIEW COMPARISON:  Chest x-ray 07/20/2022.  Chest CT 07/20/2022. FINDINGS: The heart is enlarged. Aorta is ectatic, unchanged. There are areas of scarring bilaterally which appears  similar to the prior study. Bullous change again noted in the left lung apex. There some patchy airspace opacities in the left mid lung, mildly increased. There is no pleural effusion or pneumothorax. No acute fractures are seen. There surgical clips in the upper abdomen. IMPRESSION: 1. Patchy airspace opacities in the left mid lung, mildly increased since the prior study. This could be infectious or inflammatory. 2. Stable cardiomegaly. 3. Stable areas of scarring bilaterally. Electronically Signed   By: Ronney Asters M.D.   On: 08/02/2022 21:17    Scheduled Meds:    aspirin EC  81 mg Oral Daily   atorvastatin  20 mg Oral Daily   donepezil  10 mg Oral QHS   finasteride  5 mg Oral QHS   guaiFENesin  600 mg Oral BID   ipratropium  0.5 mg Nebulization Q6H   levalbuterol  1.25 mg Nebulization Q6H   levETIRAcetam  250 mg Oral Daily   levETIRAcetam  500 mg Oral QHS   methylPREDNISolone (SOLU-MEDROL) injection  80 mg Intravenous Q12H   traZODone  200 mg Oral QHS    Continuous Infusions:    azithromycin Stopped (08/02/22 2357)   ceFEPime (MAXIPIME) IV 2 g (08/03/22 0740)   [START ON 08/05/2022] vancomycin       LOS: 1 day     Vernell Leep, MD,  FACP, FHM, SFHM, [facility], [place]     To contact the attending provider between 7A-7P or the covering provider during after hours 7P-7A, please log into the web site [email or web address] and access using universal [place] password for that web site. If you do not have the password, please call the hospital operator.  08/03/2022, 7:55 AM

## 2022-08-03 NOTE — ED Notes (Signed)
Admitting physician at bedside to evaluate patient. Was told to wean patient off of venturi mask and back to home O2. Patient taken off of mask and placed on 5L [place], satting 93%. Was told goal is 89-92%

## 2022-08-04 DIAGNOSIS — N179 Acute kidney failure, unspecified: Secondary | ICD-10-CM | POA: Diagnosis not present

## 2022-08-04 DIAGNOSIS — J9621 Acute and chronic respiratory failure with hypoxia: Secondary | ICD-10-CM | POA: Diagnosis not present

## 2022-08-04 DIAGNOSIS — J441 Chronic obstructive pulmonary disease with (acute) exacerbation: Secondary | ICD-10-CM | POA: Diagnosis not present

## 2022-08-04 MED ORDER — IPRATROPIUM-ALBUTEROL 0.5-2.5 (3) MG/3ML IN SOLN
3.0000 mL | Freq: Four times a day (QID) | RESPIRATORY_TRACT | Status: DC
  Administered 2022-08-04: 3 mL via RESPIRATORY_TRACT
  Filled 2022-08-04: qty 3

## 2022-08-04 MED ORDER — AMOXICILLIN-POT CLAVULANATE 875-125 MG PO TABS
1.0000 | ORAL_TABLET | Freq: Two times a day (BID) | ORAL | Status: DC
  Administered 2022-08-04 – 2022-08-07 (×7): 1 via ORAL
  Filled 2022-08-04 (×7): qty 1

## 2022-08-04 MED ORDER — IPRATROPIUM-ALBUTEROL 0.5-2.5 (3) MG/3ML IN SOLN
3.0000 mL | Freq: Three times a day (TID) | RESPIRATORY_TRACT | Status: DC
  Administered 2022-08-04 – 2022-08-05 (×3): 3 mL via RESPIRATORY_TRACT
  Filled 2022-08-04 (×4): qty 3

## 2022-08-04 MED ORDER — PANTOPRAZOLE SODIUM 40 MG PO TBEC
40.0000 mg | DELAYED_RELEASE_TABLET | Freq: Every day | ORAL | Status: DC
  Administered 2022-08-04 – 2022-08-07 (×4): 40 mg via ORAL
  Filled 2022-08-04 (×4): qty 1

## 2022-08-04 NOTE — Plan of Care (Signed)

## 2022-08-04 NOTE — Plan of Care (Signed)
Id brief plan of care note  Asked by dr Algis Liming regarding pna in this patient who had 7 day typical CAP course and copd exacerbation tx readmitted for hypoxic distress Pct negative x3 Afebrile No leukocytosis (day 1 then resolved) Home 5 liters o2  Reviewed chest ct. 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

## 2022-08-04 NOTE — Progress Notes (Signed)
08/04/22 1600  Mobility  Activity Ambulated with assistance in hallway  Level of Assistance Contact guard assist, steadying assist  Assistive Device None  Distance Ambulated (ft) 170 ft  Activity Response Tolerated fair  $Mobility charge 1 Mobility   Mobility Specialist Progress Note  Pre-Mobility: 95% SpO2 During Mobility: 69-92% SpO2 Post-Mobility: 87% SpO2  Pt was in bed and agreeable w/ RN in room. X2 standing breaks d/t SOB. Returned to bed w/ all needs met and call bell in reach. RN notified   Lucious Groves Mobility Specialist  Please contact via SecureChat or Rehab office at [phone]

## 2022-08-04 NOTE — TOC Progression Note (Signed)
Transition of Care [facility]) - Progression Note    Patient Details  Name: Frank Meyer MRN: [id number] Date of Birth: 1940/08/21  Transition of Care [facility]) CM/SW Contact  Zenon Mayo, RN Phone Number: 08/04/2022, 3:19 PM  Clinical Narrative:    from home, copd ex, conts on 5 liters at home, desats to 28's , conts on iv steroids.  TOC following.         Expected Discharge Plan and Services                                                 Social Determinants of Health (SDOH) Interventions    Readmission Risk Interventions     No data to display

## 2022-08-04 NOTE — TOC Progression Note (Signed)
Transition of Care [facility]) - Progression Note    Patient Details  Name: Frank Meyer MRN: [id number] Date of Birth: [birth year]  Transition of Care [facility]) CM/SW Contact  Zenon Mayo, RN Phone Number: 08/04/2022, 3:38 PM  Clinical Narrative:    NCM spoke with patient at the bedside, he has home oxygen with [employer], 5 liters in the daytime and 2 liters at night, lives with his wife at home.  Wife will transport him home at dc.  Patient is active with [facility] for HHPT, NCM offered choice, he would like to to continue with [facility].  NCM confirmed with Claiborne Billings.  Patient has a scale at home and he checks his bp as well.  He eats a low sodium diet.  TOC will continue to follow .    Expected Discharge Plan: [place] Barriers to Discharge: Continued Medical Work up  Expected Discharge Plan and Services Expected Discharge Plan: [place] In-house Referral: NA Discharge Planning Services: CM Consult Post Acute Care Choice: [place] arrangements for the past 2 months: Single Family Home                 DME Arranged: N/A DME Agency: NA       HH Arranged: PT HH Agency: [place] Date [place]: 08/04/22 Time [place]: 8916 Representative spoke with at [place]: [place] Determinants of Health ([place]) Interventions    Readmission Risk Interventions     No data to display

## 2022-08-04 NOTE — Progress Notes (Signed)
SATURATION QUALIFICATIONS: (This note is used to comply with regulatory documentation for home oxygen)  Patient Saturations on Room Air at Rest =  <70%  Patient Saturations on 5 Liters [place] 95%   Patient Saturations on 5 Liters of oxygen while Ambulating = 85-92%  Patient Saturations on 8 Liters of oxygen while Ambulating = 92-98%

## 2022-08-04 NOTE — Progress Notes (Signed)
PROGRESS NOTE   Frank Meyer  ONG:[id number]    DOB: 02-10-1941    DOA: 08/02/2022  PCP: Burnard Bunting, MD   I have briefly reviewed patients previous medical records in [facility].  Chief Complaint  Patient presents with   Shortness of Breath    Brief Narrative:  81 y.o. 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  Initially needed nonrebreather mask followed by several hours of Ventimask and then titrated to [place] oxygen 5 L/min.  Admitted for acute on chronic respiratory failure with hypoxia due to COPD exacerbation and pneumonia.  Improving.  Assessment & Plan:  Principal Problem:   Acute exacerbation of chronic obstructive pulmonary disease (COPD) (HCC) Active Problems:   BPH (benign prostatic hyperplasia)   Hyperlipidemia   Chronic diastolic CHF (congestive heart failure) (HCC)   CAP (community acquired pneumonia)   High anion gap metabolic acidosis   Elevated troponin   History of seizures   Stage 3a chronic kidney disease (CKD) (HCC)   Acute on chronic respiratory failure with hypoxia: At baseline patient on 5-6 L/min [place] oxygen.  Former smoker, quit 30 years ago.  Now precipitated by COPD exacerbation and pneumonia, treatment as below.  Titrate oxygen to maintain saturations between 89-92%.  Flutter valve incentive spirometry.  Initially in ED, needed nonrebreather mask followed by several hours of Ventimask and then titrated to [place] oxygen 5 L/min.   Lactate normal.  BNP 192.  Influenza A, B, RSV and SARS coronavirus 2 by RT-PCR negative.  Currently saturating in the low to mid 90s on 5 L/min [place] oxygen at rest.  However patient states that even minimal activity at home will drop his oxygen saturations to the 70s.  And he would "fudge" his oxygen requirement and increase it as needed.  Ambulate and assess.  Hopefully his O2 requirement will stabilize in the next 24 hours.  COPD exacerbation: IV Solu-Medrol, scheduled bronchodilator nebs and as needed nebs.  Incentive spirometry and flutter valve.  VBG without CO2 retention. Follows with Dr. Silas Flood, Pulmonology and has an appointment on 08/25/2021 and is advised to keep that.  Improving.  Will reduce IV Solu-Medrol from 80 mg to 40 mg twice daily.  Prednisone taper at discharge.  Pneumonia Recently completed treatment for pneumonia during prior hospitalization.  Concern for CAP versus HCAP.  Thereby antibiotics broadened to IV vancomycin and cefepime.  Stopped azithromycin since he has recently completed an atypical coverage.  Procalcitonin <0.10 x 2, argues against bacterial infectious etiology. Chest x-ray shows patchy airspace disease in the left midlung, worse than prior.  Discussed with Dr. 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  He stated that CT shows empty blebs with exudate/debris suggestive of infection which will take longer than usual antibiotic duration to clear.  This especially in a patient with advanced COPD.  Switched to Augmentin  12/19.  Elevated troponin: HS Troponin up from 14-63-137.  No angina.  Likely due to demand ischemia from acute respiratory failure.  TTE with preserved LVEF, no regional wall motion abnormalities and no  changes compared to prior study.  Acute kidney injury complicating stage II CKD: Baseline creatinine appears to be in the 1.1 range.  Presented with creatinine of 1.4.  Resolved  Anion gap metabolic acidosis: Likely secondary to acute respiratory failure and AKI.  Resolved.  BPH Continue home finasteride  Chronic diastolic CHF TTE January 6644 with LVEF 50-55% and grade 1 diastolic dysfunction.  Clinically euvolemic.  Hyperlipidemia Continue atorvastatin  History of seizures: Continue Keppra.  Body mass index is 25.98 kg/m.   DVT prophylaxis: SCDs Start: 08/02/22 2200     Code Status: Full Code:  Family Communication: Spouse via phone 12/18. Disposition:  Status is: Inpatient Remains inpatient appropriate because: IV meds     Consultants:     Procedures:     Antimicrobials:   As above   Subjective:  No cough.  No dyspnea at rest.  However states that he is worried that even getting up to side of bed may drop his O2 saturations down to the 70s like it has done in the past at home where he would adjust his home oxygen even to greater than 5.  Indicates that he has an upcoming appointment with Dr. Silas Flood.  Objective:   Vitals:   08/04/22 0457 08/04/22 0756 08/04/22 0802 08/04/22 1047  BP: (!) 94/48  118/62 124/61  Pulse: (!) 57 80 86 64  Resp: '14 18 20 20  '$ Temp: 97.9 F (36.6 C)  97.7 F (36.5 C) 97.6 F (36.4 C)  TempSrc: Oral  Oral Oral  SpO2: 94% 95% 90% 94%  Weight: 73 kg     Height:        General exam: Elderly male, moderately built and frail lying comfortably propped up in bed without distress.  Respiratory system: Improved breath sounds.  Clear to auscultation anteriorly.  Occasional rhonchi posteriorly but fair air entry.  No crackles.  No increased work of breathing. Cardiovascular system: S1 & S2 heard, RRR. No JVD, murmurs, rubs, gallops or clicks. No pedal edema.  Off telemetry. Gastrointestinal system: Abdomen is nondistended, soft and  nontender. No organomegaly or masses felt. Normal bowel sounds heard. Central nervous system: Alert and oriented. No focal neurological deficits. Extremities: Symmetric 5 x 5 power. Skin: No rashes, lesions or ulcers Psychiatry: Judgement and insight appear normal. Mood & affect appropriate.     Data Reviewed:   I have personally reviewed following labs and imaging studies   CBC: Recent Labs  Lab 08/02/22 2007 08/02/22 2043 08/03/22 0449  WBC 19.1*  --  9.3  NEUTROABS  --   --  8.3*  HGB 14.2 15.6 12.6*  HCT 46.3 46.0 40.5  MCV 88.5  --  85.8  PLT 243  --  034    Basic Metabolic Panel: Recent Labs  Lab 08/02/22 2007 08/02/22 2043 08/02/22 2216 08/03/22 0449  NA 138 136  --  137  K 4.4 4.3  --  3.9  CL 103  --   --  105  CO2 18*  --   --  21*  GLUCOSE 143*  --   --  199*  BUN 24*  --   --  20  CREATININE 1.43*  --   --  1.09  CALCIUM 8.6*  --   --  7.9*  MG  --   --  2.1 2.4  PHOS  --   --   --  3.0    Liver Function Tests: Recent Labs  Lab 08/03/22 0449  AST 30  ALT 46*  ALKPHOS 50  BILITOT 0.6  PROT 5.9*  ALBUMIN 2.8*    CBG: No results for input(s): "GLUCAP" in the last 168 hours.  Microbiology Studies:   Recent Results (from the past 240 hour(s))  Resp panel by RT-PCR (RSV, Flu A&B, Covid) Anterior Nasal Swab     Status: None   Collection Time: 08/02/22  8:07 PM   Specimen: Anterior Nasal Swab  Result Value Ref Range Status   SARS Coronavirus 2 by RT PCR NEGATIVE NEGATIVE Final    Comment: (NOTE) SARS-CoV-2 target nucleic acids are NOT DETECTED.  The SARS-CoV-2 RNA is generally detectable in upper respiratory specimens during the acute phase of infection. The lowest concentration of SARS-CoV-2 viral copies this assay can detect is 138 copies/mL. A negative result does not preclude SARS-Cov-2 infection and should not be used as the sole basis for treatment or other patient management decisions. A negative result may occur with  improper  specimen collection/handling, submission of specimen other than nasopharyngeal swab, presence of viral mutation(s) within the areas targeted by this assay, and inadequate number of viral copies(<138 copies/mL). A negative result must be combined with clinical observations, patient history, and epidemiological information. The expected result is Negative.  Fact Sheet for Patients:  EntrepreneurPulse.com.au  Fact Sheet for Healthcare Providers:  IncredibleEmployment.be  This test is no t yet approved or cleared by the [place] FDA and  has been authorized for detection and/or diagnosis of SARS-CoV-2 by FDA under an Emergency Use Authorization (EUA). This EUA will remain  in effect (meaning this test can be used) for the duration of the COVID-19 declaration under Section 564(b)(1) of the Act, 21 U.S.C.section 360bbb-3(b)(1), unless the authorization is terminated  or revoked sooner.       Influenza A by PCR NEGATIVE NEGATIVE Final   Influenza B by PCR NEGATIVE NEGATIVE Final    Comment: (NOTE) The Xpert Xpress SARS-CoV-2/FLU/RSV plus assay is intended as an aid in the diagnosis of influenza from Nasopharyngeal swab specimens and should not be used as a sole basis for treatment. Nasal washings and aspirates are unacceptable for Xpert Xpress SARS-CoV-2/FLU/RSV testing.  Fact Sheet for Patients: EntrepreneurPulse.com.au  Fact Sheet for Healthcare Providers: IncredibleEmployment.be  This test is not yet approved or cleared by the [place] FDA and has been authorized for detection and/or diagnosis of SARS-CoV-2 by FDA under an Emergency Use Authorization (EUA). This EUA will remain in effect (meaning this test can be used) for the duration of the COVID-19 declaration under Section 564(b)(1) of the Act, 21 U.S.C. section 360bbb-3(b)(1), unless the authorization is terminated or revoked.     Resp  Syncytial Virus by PCR NEGATIVE NEGATIVE Final    Comment: (NOTE) Fact Sheet for Patients: EntrepreneurPulse.com.au  Fact Sheet for Healthcare Providers: IncredibleEmployment.be  This test is not yet approved or cleared by the [place] FDA and has been authorized for detection and/or diagnosis of SARS-CoV-2 by FDA under an Emergency Use Authorization (EUA). This EUA will remain in effect (meaning this test can be used) for the duration of the COVID-19 declaration under Section 564(b)(1) of the Act, 21 U.S.C. section 360bbb-3(b)(1), unless the authorization is terminated or revoked.  Performed at [facility], [place] [address], [place], [place] [address]     Radiology Studies:  ECHOCARDIOGRAM COMPLETE  Result Date: 08/03/2022    ECHOCARDIOGRAM REPORT   Patient Name:   Frank Meyer [facility] Date of Exam: 08/03/2022 Medical Rec #:  [id number]          Height:       66.0 in Accession #:    [id number]         Weight:       160.0 lb Date of Birth:  March 21, 1941           BSA:          1.819 m Patient Age:    9 years           BP:           103/65 mmHg Patient Gender: M                  HR:           80 bpm. Exam Location:  Inpatient Procedure: 2D Echo, Color Doppler and Cardiac Doppler Indications:     Shortness of Breath  History:         Patient has prior history of Echocardiogram examinations. COPD;                  Signs/Symptoms:Shortness of Breath.  Sonographer:     Ula Lingo RDCS (AE, PE) Referring Phys:  [id number] Rhetta Mura Diagnosing Phys: Eleonore Chiquito MD IMPRESSIONS  1. Left ventricular ejection fraction, by estimation, is 55 to 60%. The left ventricle has normal function. The left ventricle has no regional wall motion abnormalities. Left ventricular diastolic parameters are consistent with Grade I diastolic dysfunction (impaired relaxation).  2. Right ventricular systolic function is normal. The right ventricular size is mildly  enlarged. Tricuspid regurgitation signal is inadequate for assessing PA pressure.  3. Left atrial size was mild to moderately dilated.  4. The mitral valve is grossly normal. Trivial mitral valve regurgitation. No evidence of mitral stenosis.  5. The aortic valve is tricuspid. Aortic valve regurgitation is trivial. Aortic valve sclerosis is present, with no evidence of aortic valve stenosis. Comparison(s): No significant change from prior study. FINDINGS  Left Ventricle: Left ventricular ejection fraction, by estimation, is 55 to 60%. The left ventricle has normal function. The left ventricle has no regional wall motion abnormalities. The left ventricular internal cavity size was normal in size. There is  no left ventricular hypertrophy. Left ventricular diastolic parameters are consistent with Grade I diastolic dysfunction (impaired relaxation). Right Ventricle: The right ventricular size is mildly enlarged. No increase in right ventricular wall thickness. Right ventricular systolic function is normal. Tricuspid regurgitation signal is inadequate for assessing PA pressure. Left Atrium: Left atrial size was mild to moderately dilated. Right Atrium: Right atrial size was normal in size. Pericardium: Trivial pericardial effusion is present. Mitral Valve: The mitral valve is grossly normal. Trivial mitral valve regurgitation. No evidence of mitral valve stenosis. Tricuspid Valve: The tricuspid valve is grossly normal. Tricuspid valve regurgitation is trivial. No evidence of tricuspid stenosis. Aortic Valve: The aortic valve is tricuspid. Aortic valve regurgitation is trivial. Aortic regurgitation PHT measures 763 msec. Aortic valve sclerosis is present, with no evidence of aortic valve stenosis. Pulmonic Valve: The pulmonic valve was grossly normal. Pulmonic valve regurgitation is not visualized. No evidence of pulmonic stenosis. Aorta: The aortic root and ascending aorta are structurally normal, with no evidence of  dilitation. Venous: The inferior vena cava was not well visualized. IAS/Shunts: The atrial septum is grossly normal.  LEFT VENTRICLE PLAX 2D LVIDd:  4.90 cm      Diastology LVIDs:         3.00 cm      LV e' medial:    6.42 cm/s LV PW:         0.80 cm      LV E/e' medial:  11.1 LV IVS:        0.80 cm      LV e' lateral:   5.66 cm/s LVOT diam:     2.00 cm      LV E/e' lateral: 12.5 LV SV:         66 LV SV Index:   36 LVOT Area:     3.14 cm  LV Volumes (MOD) LV vol d, MOD A2C: 57.6 ml LV vol d, MOD A4C: 102.6 ml LV vol s, MOD A2C: 38.1 ml LV vol s, MOD A4C: 35.6 ml LV SV MOD A2C:     19.5 ml LV SV MOD A4C:     102.6 ml LV SV MOD BP:      44.3 ml RIGHT VENTRICLE RV S prime:     22.30 cm/s TAPSE (M-mode): 2.4 cm LEFT ATRIUM             Index        RIGHT ATRIUM           Index LA diam:        4.30 cm 2.36 cm/m   RA Area:     25.50 cm LA Vol (A2C):   57.5 ml 31.61 ml/m  RA Volume:   86.10 ml  47.34 ml/m LA Vol (A4C):   76.9 ml 42.28 ml/m LA Biplane Vol: 67.0 ml 36.84 ml/m  AORTIC VALVE LVOT Vmax:   117.00 cm/s LVOT Vmean:  80.200 cm/s LVOT VTI:    0.211 m AI PHT:      763 msec  AORTA Ao Root diam: 3.40 cm Ao Asc diam:  3.40 cm MITRAL VALVE MV Area (PHT): 3.95 cm    SHUNTS MV Decel Time: 192 msec    Systemic VTI:  0.21 m MV E velocity: 71.00 cm/s  Systemic Diam: 2.00 cm MV A velocity: 82.50 cm/s MV E/A ratio:  0.86 Eleonore Chiquito MD Electronically signed by Eleonore Chiquito MD Signature Date/Time: 08/03/2022/12:15:26 PM    Final (Updated)    DG Chest 1 View  Result Date: 08/02/2022 CLINICAL DATA:  Shortness of breath EXAM: CHEST  1 VIEW COMPARISON:  Chest x-ray 07/20/2022.  Chest CT 07/20/2022. FINDINGS: The heart is enlarged. Aorta is ectatic, unchanged. There are areas of scarring bilaterally which appears similar to the prior study. Bullous change again noted in the left lung apex. There some patchy airspace opacities in the left mid lung, mildly increased. There is no pleural effusion or pneumothorax.  No acute fractures are seen. There surgical clips in the upper abdomen. IMPRESSION: 1. Patchy airspace opacities in the left mid lung, mildly increased since the prior study. This could be infectious or inflammatory. 2. Stable cardiomegaly. 3. Stable areas of scarring bilaterally. Electronically Signed   By: Ronney Asters M.D.   On: 08/02/2022 21:17    Scheduled Meds:    amoxicillin-clavulanate  1 tablet Oral Q12H   aspirin EC  81 mg Oral Daily   atorvastatin  20 mg Oral Daily   cholecalciferol  1,000 Units Oral Daily   donepezil  10 mg Oral QHS   finasteride  5 mg Oral QHS   guaiFENesin  600 mg Oral BID   ipratropium-albuterol  3 mL Nebulization TID   levETIRAcetam  250 mg Oral Daily   levETIRAcetam  500 mg Oral QHS   loratadine  10 mg Oral Daily   methylPREDNISolone (SOLU-MEDROL) injection  80 mg Intravenous Q12H   sacubitril-valsartan  1 tablet Oral QPM   thiamine  100 mg Oral Daily   traZODone  200 mg Oral QHS    Continuous Infusions:       LOS: 2 days     Vernell Leep, MD,  FACP, FHM, SFHM, [facility], [place]     To contact the attending provider between 7A-7P or the covering provider during after hours 7P-7A, please log into the web site [email or web address] and access using universal [place] password for that web site. If you do not have the password, please call the hospital operator.  08/04/2022, 12:07 PM

## 2022-08-05 DIAGNOSIS — E8729 Other acidosis: Secondary | ICD-10-CM | POA: Diagnosis not present

## 2022-08-05 DIAGNOSIS — R0600 Dyspnea, unspecified: Secondary | ICD-10-CM | POA: Diagnosis not present

## 2022-08-05 DIAGNOSIS — N400 Enlarged prostate without lower urinary tract symptoms: Secondary | ICD-10-CM | POA: Diagnosis not present

## 2022-08-05 DIAGNOSIS — R918 Other nonspecific abnormal finding of lung field: Secondary | ICD-10-CM

## 2022-08-05 DIAGNOSIS — J189 Pneumonia, unspecified organism: Secondary | ICD-10-CM

## 2022-08-05 DIAGNOSIS — J9601 Acute respiratory failure with hypoxia: Secondary | ICD-10-CM

## 2022-08-05 DIAGNOSIS — J441 Chronic obstructive pulmonary disease with (acute) exacerbation: Secondary | ICD-10-CM | POA: Diagnosis not present

## 2022-08-05 LAB — COMPREHENSIVE METABOLIC PANEL
ALT: 40 U/L (ref 0–44)
AST: 24 U/L (ref 15–41)
Albumin: 2.4 g/dL — ABNORMAL LOW (ref 3.5–5.0)
Alkaline Phosphatase: 44 U/L (ref 38–126)
Anion gap: 7 (ref 5–15)
BUN: 22 mg/dL (ref 8–23)
CO2: 24 mmol/L (ref 22–32)
Calcium: 8.4 mg/dL — ABNORMAL LOW (ref 8.9–10.3)
Chloride: 110 mmol/L (ref 98–111)
Creatinine, Ser: 0.84 mg/dL (ref 0.61–1.24)
GFR, Estimated: >60 mL/min (ref >60)
Glucose, Bld: 156 mg/dL — ABNORMAL HIGH (ref 70–99)
Potassium: 4.5 mmol/L (ref 3.5–5.1)
Sodium: 141 mmol/L (ref 135–145)
Total Bilirubin: 0.8 mg/dL (ref 0.3–1.2)
Total Protein: 5.2 g/dL — ABNORMAL LOW (ref 6.5–8.1)

## 2022-08-05 LAB — CBC WITH DIFFERENTIAL/PLATELET
Abs Immature Granulocytes: 0.33 10*3/uL — ABNORMAL HIGH (ref 0.00–0.07)
Basophils Absolute: 0 10*3/uL (ref 0.0–0.1)
Basophils Relative: 0 %
Eosinophils Absolute: 0 10*3/uL (ref 0.0–0.5)
Eosinophils Relative: 0 %
HCT: 36 % — ABNORMAL LOW (ref 39.0–52.0)
Hemoglobin: 11.6 g/dL — ABNORMAL LOW (ref 13.0–17.0)
Immature Granulocytes: 1 %
Lymphocytes Relative: 5 %
Lymphs Abs: 1.1 10*3/uL (ref 0.7–4.0)
MCH: 27.1 pg (ref 26.0–34.0)
MCHC: 32.2 g/dL (ref 30.0–36.0)
MCV: 84.1 fL (ref 80.0–100.0)
Monocytes Absolute: 0.9 10*3/uL (ref 0.1–1.0)
Monocytes Relative: 4 %
Neutro Abs: 22.2 10*3/uL — ABNORMAL HIGH (ref 1.7–7.7)
Neutrophils Relative %: 90 %
Platelets: 213 10*3/uL (ref 150–400)
RBC: 4.28 MIL/uL (ref 4.22–5.81)
RDW: 15.3 % (ref 11.5–15.5)
WBC: 24.6 10*3/uL — ABNORMAL HIGH (ref 4.0–10.5)
nRBC: 0 % (ref 0.0–0.2)

## 2022-08-05 LAB — MAGNESIUM: Magnesium: 2.3 mg/dL (ref 1.7–2.4)

## 2022-08-05 LAB — PHOSPHORUS: Phosphorus: 3.1 mg/dL (ref 2.5–4.6)

## 2022-08-05 MED ORDER — IPRATROPIUM-ALBUTEROL 0.5-2.5 (3) MG/3ML IN SOLN: 3.0000 mL | Freq: Two times a day (BID) | RESPIRATORY_TRACT | Status: DC

## 2022-08-05 MED ORDER — IPRATROPIUM-ALBUTEROL 0.5-2.5 (3) MG/3ML IN SOLN
3.0000 mL | Freq: Four times a day (QID) | RESPIRATORY_TRACT | Status: DC
  Administered 2022-08-05 – 2022-08-06 (×3): 3 mL via RESPIRATORY_TRACT
  Filled 2022-08-05 (×3): qty 3

## 2022-08-05 MED ORDER — DM-GUAIFENESIN ER 30-600 MG PO TB12
1.0000 | ORAL_TABLET | Freq: Two times a day (BID) | ORAL | Status: DC
  Administered 2022-08-05 – 2022-08-07 (×5): 1 via ORAL
  Filled 2022-08-05 (×5): qty 1

## 2022-08-05 NOTE — TOC Transition Note (Signed)
Transition of Care [facility]) - CM/SW Discharge Note   Patient Details  Name: Frank Meyer MRN: [id number] Date of Birth: [birth year]  [facility] CM/SW Contact:  Zenon Mayo, RN Phone Number: 08/05/2022, 3:24 PM   Clinical Narrative:    Patient is set up with [facility] for [place], will need HHPT order.  Wife will transport him home. He has home oxygen with [employer], NCM spoke with [facility] with [employer], they will send a rep out to patient's home to check concentrator liter flow, patient states it goes up to 6 to 8 liters, [employer] just wants to make sure it goes up to 8 liters.  Poss dc tomorrow.   Final next level of care: [place] Barriers to Discharge: Continued Medical Work up   Patient Goals and CMS Choice Patient states their goals for this hospitalization and ongoing recovery are:: return home with wife CMS Medicare.gov Compare Post Acute Care list provided to:: Patient Choice offered to / list presented to : Patient    Discharge Placement                       Discharge Plan and Services In-house Referral: NA Discharge Planning Services: CM Consult Post Acute Care Choice: Home Health          DME Arranged: N/A DME Agency: NA       HH Arranged: PT HH Agency: [place] Date [place]: 08/04/22 Time [place]: [phone] Representative spoke with at [place]: [place] Determinants of Health ([place]) Interventions     Readmission Risk Interventions     No data to display

## 2022-08-05 NOTE — Care Management Important Message (Signed)
Important Message  Patient Details  Name: Frank Meyer MRN: [id number] Date of Birth: 1941-01-27   Medicare Important Message Given:  Yes     Hannah Beat 08/05/2022, 3:22 PM

## 2022-08-05 NOTE — Progress Notes (Signed)
Frank Meyer [id number]:[id number] DOB: [birth year] DOA: 08/02/2022 PCP: Burnard Bunting, MD   Subj: 82 y.o. married WM PMHx independent, with medical history significant for chronic hypoxic respiratory failure on 5 to 6 L continuous nasal cannula, severe COPD, chronic diastolic heart failure (LVEF 50-55% in January 2023), CKD stage IIIa (Baseline creatinine 1.2-1.5), recent hospitalization 07/20/2022 - 07/22/2022 for community-acquired pneumonia, COPD exacerbation at which time CT chest had shown bilateral lower lobe opacities, treated with ceftriaxone, doxycycline and discharged on doxycycline with prednisone taper,   Presented to the ED on 08/02/2022 with worsening dyspnea.  Initially needed nonrebreather mask followed by several hours of Ventimask and then titrated to [place] oxygen 5 L/min.  Admitted for acute on chronic respiratory failure with hypoxia due to COPD exacerbation and pneumonia.  Improving.    Obj: A/O x 4, positive increase respiratory distress or baseline.  Patient has pulmonologist at [facility].  States has appointment on 08/25/2022   Objective: VITAL SIGNS: Temp: 97.7 F (36.5 C) (12/20 0748) Temp Source: Oral (12/20 0748) BP: 111/65 (12/20 0748) Pulse Rate: 67 (12/20 0756) SPO2; FIO2:   Intake/Output Summary (Last 24 hours) at 08/05/2022 1118 Last data filed at 08/05/2022 0409 Gross per 24 hour  Intake 997 ml  Output 1600 ml  Net -603 ml     Exam: Physical Exam:  General: A/O x 4, positive acute on chronic respiratory distress, cachectic Eyes: negative scleral hemorrhage, negative anisocoria, negative icterus ENT: Negative Runny nose, negative gingival bleeding, Neck:  Negative scars, masses, torticollis, lymphadenopathy, JVD Lungs: tachypneic air movement all lung fields, without wheezes or crackles Cardiovascular: Regular rate and rhythm without murmur gallop or rub normal S1 and S2 Abdomen: negative abdominal pain, nondistended, positive soft, bowel sounds, no  rebound, no ascites, no appreciable mass Extremities: No significant cyanosis, clubbing, or edema bilateral lower extremities Skin: Negative rashes, lesions, ulcers Psychiatric:  Negative depression, negative anxiety, negative fatigue, negative mania  Central nervous system:  Cranial nerves II through XII intact, tongue/uvula midline, all extremities muscle strength 5/5, sensation intact throughout, negative dysarthria, negative expressive aphasia, negative receptive aphasia.    Mobility Assessment (last 72 hours)     Mobility Assessment     Row Name 08/05/22 0000 08/04/22 2000 08/03/22 2045 08/03/22 1731 08/03/22 03:33:01   Does patient have an order for bedrest or is patient medically unstable No - Continue assessment No - Continue assessment No - Continue assessment No - Continue assessment No - Continue assessment   What is the highest level of mobility based on the progressive mobility assessment? Level 5 (Walks with assist in room/hall) - Balance while stepping forward/back and can walk in room with assist - Complete Level 5 (Walks with assist in room/hall) - Balance while stepping forward/back and can walk in room with assist - Complete Level 4 (Walks with assist in room) - Balance while marching in place and cannot step forward and back - Complete Level 4 (Walks with assist in room) - Balance while marching in place and cannot step forward and back - Complete Level 3 (Stands with assist) - Balance while standing  and cannot march in place   Is the above level different from baseline mobility prior to current illness? Yes - Recommend PT order Yes - Recommend PT order Yes - Recommend PT order -- --              DVT prophylaxis: SCD Code Status:  Family Communication:  Status is: Inpatient    Dispo: The patient is from:  Home              Anticipated d/c is to: Home              Anticipated d/c date is: > 3 days              Patient currently is not medically stable to  d/c.    Procedures/Significant Events: 12/4 CTA PE protocol  Developing infection/inflammation of bilateral lower lobes and right upper lobe superimposed on a background of emphysema. Associated bilateral trace pleural effusions. 2. Interval increase in size of a right upper lobe 1.4 x 1.4 cm (from 1 x 1.2 cm) pulmonary nodule. Additional imaging evaluation or consultation with Pulmonology or Thoracic Surgery recommended. 3. Interval development of a Nodular-like airspace opacity of the lingula measuring 1.6 x 1.4 cm. Consider one of the following in 3 months for both low-risk and high-risk individuals: (a) repeat chest CT, (b) follow-up PET-CT, or (c) tissue sampling. This recommendation follows the consensus statement: Guidelines for Management of Incidental Pulmonary Nodules Detected on CT Images: From the Fleischner Society 2017; Radiology 2017; 284:228-243. 4. Right hilar lymphadenopathy and prominent but nonenlarged mediastinal lymph nodes likely reactive in etiology. Recommend attention on follow-up. 5. Small hiatal hernia.  Consultants:     Cultures    Antimicrobials:    Assessment & Plan: Covid vaccination;   Principal Problem:   Acute exacerbation of chronic obstructive pulmonary disease (COPD) ([place]) Active Problems:   BPH (benign prostatic hyperplasia)   Hyperlipidemia   Chronic diastolic CHF (congestive heart failure) ([place])   CAP (community acquired pneumonia)   High anion gap metabolic acidosis   Elevated troponin   History of seizures   Stage 3a chronic kidney disease (CKD) ([facility])  Acute on chronic respiratory failure with hypoxia: At baseline patient on 5-6 L/min [place] oxygen.  Former smoker, quit 30 years ago.  Now precipitated by COPD exacerbation and pneumonia, treatment as below.  Titrate oxygen to maintain saturations between 89-92%.  Flutter valve incentive spirometry.  Initially in ED, needed nonrebreather mask followed by several hours of  Ventimask and then titrated to [place] oxygen 5 L/min.  Lactate normal.  BNP 192.  Influenza A, B, RSV and SARS coronavirus 2 by RT-PCR negative.  Currently saturating in the low to mid 90s on 5 L/min [place] oxygen at rest.  However patient states that even minimal activity at home will drop his oxygen saturations to the 70s.  And he would "fudge" his oxygen requirement and increase it as needed.  Ambulate and assess.  Hopefully his O2 requirement will stabilize in the next 24 hours.  SATURATION QUALIFICATIONS: (This note is used to comply with regulatory documentation for home oxygen)  Patient Saturations on Room Air at Rest =  <70%  Patient Saturations on 5 Liters [place] 95%  Patient Saturations on 5 Liters of oxygen while Ambulating = 85-92%  Patient Saturations on 8 Liters of oxygen while Ambulating = 92-98%    -Patient meets criteria for home O2 - 5 L O2, titrate to maintain SpO2 89 to 93% -Patient states he believes he has a high flow tank at home check   COPD exacerbation: IV Solu-Medrol, scheduled bronchodilator nebs and as needed nebs.  Incentive spirometry and flutter valve.  VBG without CO2 retention. Follows with Dr. Silas Flood, Pulmonology and has an appointment on 08/25/2021 and is advised to keep that.  Improving.  Will reduce IV Solu-Medrol from 80 mg to 40 mg twice daily.  Prednisone taper at discharge. -  12/20 changed to prednisone 60 mg daily.  Will put patient on long taper at discharge. - 12/20 DuoNeb QID - 12/20 flutter valve - 12/20 incentive spirometry -12/20 Mucinex DM BID   Pneumonia Recently completed treatment for pneumonia during prior hospitalization.  Concern for CAP versus HCAP.  Thereby antibiotics broadened to IV vancomycin and cefepime.  Stopped azithromycin since he has recently completed an atypical coverage.  Procalcitonin <0.10 x 2, argues against bacterial infectious etiology. Chest x-ray shows patchy airspace disease in the left midlung, worse than prior.  Discussed with  Dr. 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  He stated that CT shows empty blebs with exudate/debris suggestive of infection which will take longer than usual antibiotic duration to clear.  This especially in a patient with advanced COPD.  Switched to Augmentin 12/19.  Lung nodules - Patient with multiple lung nodules pathological in size.  Increasing in size. -CTA PE protocol was performed on previous admission, per patient nobody discussed findings or follow-up. -Consult PCCM on 12/21.  Given patient's severe emphysema high risk for any sort of intervention to obtain tissue sample.  Chronic diastolic CHF -TTE January 2023 with LVEF 50-55% and grade 1 diastolic dysfunction.  -Treat in and out - Daily weight    Elevated troponin: HS Troponin up from 14-63-137.  No angina.  Likely due to demand ischemia from acute respiratory failure.  TTE with preserved LVEF, no regional wall motion abnormalities and no changes compared to prior study.   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   Lab Results  Component Value Date   CREATININE 0.84 08/05/2022   CREATININE 1.09 08/03/2022   CREATININE 1.43 (H) 08/02/2022   CREATININE 1.23 07/22/2022   CREATININE 1.45 (H) 07/20/2022  -Resolved     Anion gap metabolic acidosis: Likely secondary to acute respiratory failure and AKI.  Resolved.   BPH Continue home finasteride    Hyperlipidemia Continue atorvastatin   History of seizures: Continue Keppra.          Mobility Assessment (last 72 hours)     Mobility Assessment     Row Name 08/05/22 0000 08/04/22 2000 08/03/22 2045 08/03/22 1731 08/03/22 03:33:01   Does patient  have an order for bedrest or is patient medically unstable No - Continue assessment No - Continue assessment No - Continue assessment No - Continue assessment No - Continue assessment   What is the highest level of mobility based on the progressive mobility assessment? Level 5 (Walks with assist in room/hall) - Balance while stepping forward/back and can walk in room with assist - Complete Level 5 (Walks with assist in room/hall) - Balance while stepping forward/back and can walk in room with assist - Complete Level 4 (Walks with assist in room) - Balance while marching in place and cannot step forward and back - Complete Level 4 (Walks with assist in room) - Balance while marching in place and cannot step forward and back - Complete Level 3 (Stands with assist) - Balance while standing  and cannot march in place   Is the above level different from baseline mobility prior to current illness? Yes - Recommend PT order Yes - Recommend PT order Yes - Recommend  PT order -- --             Time: 50 minutes        Care during the described time interval was provided by me .  I have reviewed this patient's available data, including medical history, events of note, physical examination, and all test results as part of my evaluation.

## 2022-08-06 DIAGNOSIS — E8729 Other acidosis: Secondary | ICD-10-CM | POA: Diagnosis not present

## 2022-08-06 DIAGNOSIS — R0600 Dyspnea, unspecified: Secondary | ICD-10-CM | POA: Diagnosis not present

## 2022-08-06 DIAGNOSIS — J441 Chronic obstructive pulmonary disease with (acute) exacerbation: Secondary | ICD-10-CM | POA: Diagnosis not present

## 2022-08-06 DIAGNOSIS — N400 Enlarged prostate without lower urinary tract symptoms: Secondary | ICD-10-CM | POA: Diagnosis not present

## 2022-08-06 LAB — CBC WITH DIFFERENTIAL/PLATELET
Abs Immature Granulocytes: 0.15 10*3/uL — ABNORMAL HIGH (ref 0.00–0.07)
Basophils Absolute: 0 10*3/uL (ref 0.0–0.1)
Basophils Relative: 0 %
Eosinophils Absolute: 0 10*3/uL (ref 0.0–0.5)
Eosinophils Relative: 0 %
HCT: 37.3 % — ABNORMAL LOW (ref 39.0–52.0)
Hemoglobin: 12.2 g/dL — ABNORMAL LOW (ref 13.0–17.0)
Immature Granulocytes: 1 %
Lymphocytes Relative: 7 %
Lymphs Abs: 1.4 10*3/uL (ref 0.7–4.0)
MCH: 27.4 pg (ref 26.0–34.0)
MCHC: 32.7 g/dL (ref 30.0–36.0)
MCV: 83.6 fL (ref 80.0–100.0)
Monocytes Absolute: 0.7 10*3/uL (ref 0.1–1.0)
Monocytes Relative: 4 %
Neutro Abs: 17.3 10*3/uL — ABNORMAL HIGH (ref 1.7–7.7)
Neutrophils Relative %: 88 %
Platelets: 221 10*3/uL (ref 150–400)
RBC: 4.46 MIL/uL (ref 4.22–5.81)
RDW: 15.2 % (ref 11.5–15.5)
WBC: 19.6 10*3/uL — ABNORMAL HIGH (ref 4.0–10.5)
nRBC: 0 % (ref 0.0–0.2)

## 2022-08-06 LAB — COMPREHENSIVE METABOLIC PANEL
ALT: 73 U/L — ABNORMAL HIGH (ref 0–44)
AST: 48 U/L — ABNORMAL HIGH (ref 15–41)
Albumin: 2.5 g/dL — ABNORMAL LOW (ref 3.5–5.0)
Alkaline Phosphatase: 46 U/L (ref 38–126)
Anion gap: 7 (ref 5–15)
BUN: 21 mg/dL (ref 8–23)
CO2: 22 mmol/L (ref 22–32)
Calcium: 8.3 mg/dL — ABNORMAL LOW (ref 8.9–10.3)
Chloride: 108 mmol/L (ref 98–111)
Creatinine, Ser: 0.98 mg/dL (ref 0.61–1.24)
GFR, Estimated: >60 mL/min (ref >60)
Glucose, Bld: 185 mg/dL — ABNORMAL HIGH (ref 70–99)
Potassium: 4.4 mmol/L (ref 3.5–5.1)
Sodium: 137 mmol/L (ref 135–145)
Total Bilirubin: 1.2 mg/dL (ref 0.3–1.2)
Total Protein: 5.2 g/dL — ABNORMAL LOW (ref 6.5–8.1)

## 2022-08-06 LAB — TROPONIN I (HIGH SENSITIVITY)
Troponin I (High Sensitivity): 10 ng/L (ref <18)
Troponin I (High Sensitivity): 13 ng/L (ref <18)

## 2022-08-06 MED ORDER — IPRATROPIUM-ALBUTEROL 0.5-2.5 (3) MG/3ML IN SOLN
3.0000 mL | Freq: Four times a day (QID) | RESPIRATORY_TRACT | Status: DC
  Administered 2022-08-06 – 2022-08-07 (×3): 3 mL via RESPIRATORY_TRACT
  Filled 2022-08-06 (×3): qty 3

## 2022-08-06 NOTE — Progress Notes (Signed)
Frank Meyer NLZ:[id number] DOB: 08-Oct-1940 DOA: 08/02/2022 PCP: Burnard Bunting, MD   Subj: 81 y.o. married WM PMHx independent, with medical history significant for chronic hypoxic respiratory failure on 5 to 6 L continuous nasal cannula, severe COPD, chronic diastolic heart failure (LVEF 50-55% in January 2023), CKD stage IIIa (Baseline creatinine 1.2-1.5), recent hospitalization 07/20/2022 - 07/22/2022 for community-acquired pneumonia, COPD exacerbation at which time CT chest had shown bilateral lower lobe opacities, treated with ceftriaxone, doxycycline and discharged on doxycycline with prednisone taper,   Presented to the ED on 08/02/2022 with worsening dyspnea.  Initially needed nonrebreather mask followed by several hours of Ventimask and then titrated to [place] oxygen 5 L/min.  Admitted for acute on chronic respiratory failure with hypoxia due to COPD exacerbation and pneumonia.  Improving.    Obj: 12/21 A/O x 4, sitting in chair much more comfortable today than at admission.  Confirms again that he has appointment with Dr. Silas Flood PCCM on 08/25/2022   Objective: VITAL SIGNS: Temp: 97.8 F (36.6 C) (12/21 0813) Temp Source: Axillary (12/21 0813) BP: 124/74 (12/21 0813) Pulse Rate: 62 (12/21 0917) SPO2; FIO2:   Intake/Output Summary (Last 24 hours) at 08/06/2022 1646 Last data filed at 08/06/2022 1600 Gross per 24 hour  Intake 1914 ml  Output 3100 ml  Net -1186 ml     Physical Exam:  General: A/O x 4, positive acute on chronic respiratory distress, cachectic Eyes: negative scleral hemorrhage, negative anisocoria, negative icterus ENT: Negative Runny nose, negative gingival bleeding, Neck:  Negative scars, masses, torticollis, lymphadenopathy, JVD Lungs: decreased air movement all lung fields, positive mild bibasilar crackles Cardiovascular: Regular rate and rhythm without murmur gallop or rub normal S1 and S2 Abdomen: negative abdominal pain, nondistended, positive  soft, bowel sounds, no rebound, no ascites, no appreciable mass Extremities: No significant cyanosis, clubbing, or edema bilateral lower extremities Skin: Negative rashes, lesions, ulcers Psychiatric:  Negative depression, negative anxiety, negative fatigue, negative mania  Central nervous system:  Cranial nerves II through XII intact, tongue/uvula midline, all extremities muscle strength 5/5, sensation intact throughout, negative dysarthria, negative expressive aphasia, negative receptive aphasia.    Mobility Assessment (last 72 hours)     Mobility Assessment     Row Name 08/06/22 0813 08/06/22 0400 08/05/22 2000 08/05/22 0000 08/04/22 2000   Does patient have an order for bedrest or is patient medically unstable No - Continue assessment No - Continue assessment No - Continue assessment No - Continue assessment No - Continue assessment   What is the highest level of mobility based on the progressive mobility assessment? Level 5 (Walks with assist in room/hall) - Balance while stepping forward/back and can walk in room with assist - Complete Level 5 (Walks with assist in room/hall) - Balance while stepping forward/back and can walk in room with assist - Complete Level 5 (Walks with assist in room/hall) - Balance while stepping forward/back and can walk in room with assist - Complete Level 5 (Walks with assist in room/hall) - Balance while stepping forward/back and can walk in room with assist - Complete Level 5 (Walks with assist in room/hall) - Balance while stepping forward/back and can walk in room with assist - Complete   Is the above level different from baseline mobility prior to current illness? -- Yes - Recommend PT order Yes - Recommend PT order Yes - Recommend PT order Yes - Recommend PT order    Row Name 08/03/22 2045 08/03/22 1731         Does  patient have an order for bedrest or is patient medically unstable No - Continue assessment No - Continue assessment      What is the highest  level of mobility based on the progressive mobility assessment? Level 4 (Walks with assist in room) - Balance while marching in place and cannot step forward and back - Complete Level 4 (Walks with assist in room) - Balance while marching in place and cannot step forward and back - Complete      Is the above level different from baseline mobility prior to current illness? Yes - Recommend PT order --                 DVT prophylaxis: SCD Code Status:  Family Communication:  Status is: Inpatient    Dispo: The patient is from: Home              Anticipated d/c is to: Home              Anticipated d/c date is: > 3 days              Patient currently is not medically stable to d/c.    Procedures/Significant Events: 12/4 CTA PE protocol  Developing infection/inflammation of bilateral lower lobes and right upper lobe superimposed on a background of emphysema. Associated bilateral trace pleural effusions. 2. Interval increase in size of a right upper lobe 1.4 x 1.4 cm (from 1 x 1.2 cm) pulmonary nodule. Additional imaging evaluation or consultation with Pulmonology or Thoracic Surgery recommended. 3. Interval development of a Nodular-like airspace opacity of the lingula measuring 1.6 x 1.4 cm. Consider one of the following in 3 months for both low-risk and high-risk individuals: (a) repeat chest CT, (b) follow-up PET-CT, or (c) tissue sampling. This recommendation follows the consensus statement: Guidelines for Management of Incidental Pulmonary Nodules Detected on CT Images: From the Fleischner Society 2017; Radiology 2017; 284:228-243. 4. Right hilar lymphadenopathy and prominent but nonenlarged mediastinal lymph nodes likely reactive in etiology. Recommend attention on follow-up. 5. Small hiatal hernia.  Consultants:  PCCM   Cultures    Antimicrobials: Anti-infectives (From admission, onward)    Start     Ordered Stop   08/05/22 0600  vancomycin (VANCOCIN) IVPB 1000  mg/200 mL premix  Status:  Discontinued        08/03/22 0256 08/04/22 1207   08/04/22 1300  amoxicillin-clavulanate (AUGMENTIN) 875-125 MG per tablet 1 tablet        08/04/22 1207     08/03/22 0800  ceFEPIme (MAXIPIME) 2 g in sodium chloride 0.9 % 100 mL IVPB  Status:  Discontinued        08/03/22 0256 08/04/22 1207   08/03/22 0400  vancomycin (VANCOREADY) IVPB 1500 mg/300 mL        08/03/22 0256 08/03/22 0708   08/02/22 2145  azithromycin (ZITHROMAX) 500 mg in sodium chloride 0.9 % 250 mL IVPB  Status:  Discontinued        08/02/22 2135 08/03/22 1620   08/02/22 2145  cefTRIAXone (ROCEPHIN) 1 g in sodium chloride 0.9 % 100 mL IVPB  Status:  Discontinued        08/02/22 2135 08/03/22 0252         Assessment & Plan: Covid vaccination;   Principal Problem:   Acute exacerbation of chronic obstructive pulmonary disease (COPD) ([place]) Active Problems:   BPH (benign prostatic hyperplasia)   Hyperlipidemia   Chronic diastolic CHF (congestive heart failure) ([place])   CAP (community acquired pneumonia)  High anion gap metabolic acidosis   Elevated troponin   History of seizures   Stage 3a chronic kidney disease (CKD) ([facility])  Acute on chronic respiratory failure with hypoxia: -Baseline for -6 L/min [place] oxygen.   -Former smoker, quit 30 years ago.     SATURATION QUALIFICATIONS: (This note is used to comply with regulatory documentation for home oxygen)  Patient Saturations on Room Air at Rest =  <70%  Patient Saturations on 5 Liters [place] 95%  Patient Saturations on 5 Liters of oxygen while Ambulating = 85-92%  Patient Saturations on 8 Liters of oxygen while Ambulating = 92-98%    -Patient meets criteria for home O2 - 5 L O2, titrate to maintain SpO2 89 to 93% -Patient states he believes he has a high flow tank at home check -12/21 requested nebulizer machine for discharge.   COPD exacerbation: IV Solu-Medrol, scheduled bronchodilator nebs and as needed nebs.  Incentive spirometry and  flutter valve.  VBG without CO2 retention. Follows with Dr. Silas Flood, Pulmonology and has an appointment on 08/25/2021 and is advised to keep that.  Improving.  Will reduce IV Solu-Medrol from 80 mg to 40 mg twice daily.  Prednisone taper at discharge. -12/20 changed to prednisone 60 mg daily.  Will put patient on long taper at discharge. - 12/20 DuoNeb QID - 12/20 flutter valve - 12/20 incentive spirometry -12/20 Mucinex DM BID   Pneumonia -Recently completed treatment for pneumonia during prior hospitalization.   -Concern for CAP versus HCAP.  Thereby antibiotics broadened to IV vancomycin and cefepime.   -PXT:GGYIR patchy airspace disease in the left midlung, worse than prior.   -12/19 discussed with Dr. Gale Journey, infectious disease MD on-call.  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  Lung nodules - Patient with multiple lung nodules pathological in size.  Increasing in size. -CTA PE protocol was performed on previous admission, per patient nobody discussed findings or follow-up. -Consult PCCM on 12/21.  Given patient's severe emphysema high risk for any sort of intervention to obtain tissue sample. -12/21 per PCCM concurs any invasive intervention high risk of pneumothorax and fistula formation with biopsy. - 12/21 PCCM recommend obtain outpatient PET scan -Counseled patient on importance of following up with Dr. Silas Flood on 09/04/2022   Chronic diastolic CHF -TTE January 2023 with LVEF 50-55% and grade 1 diastolic dysfunction.  - Strict in and out -2.6 L - Daily weight  Filed Weights   08/03/22 1659 08/04/22 0457 08/06/22 0310  Weight: 73.9 kg 73 kg 74.2 kg      Elevated troponin: HS Troponin up from 14-63-137.  No angina.  Likely due to demand ischemia from acute respiratory failure.  TTE with preserved LVEF, no regional wall motion  abnormalities and no changes compared to prior study.   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   Lab Results  Component Value Date   CREATININE 0.98 08/06/2022   CREATININE 0.84 08/05/2022   CREATININE 1.09 08/03/2022   CREATININE 1.43 (H) 08/02/2022   CREATININE 1.23 07/22/2022  -Resolved     Anion gap metabolic acidosis: Likely secondary to acute respiratory failure and AKI.   -Resolved.   BPH -Finasteride 5 mg daily    Hyperlipidemia -Atorvastatin 20 mg daily   History of seizures: -Keppra 250 mg daily/500 mg qhs.  Mobility Assessment (last 72 hours)     Mobility Assessment     Row Name 08/06/22 0813 08/06/22 0400 08/05/22 2000 08/05/22 0000 08/04/22 2000   Does patient have an order for bedrest or is patient medically unstable No - Continue assessment No - Continue assessment No - Continue assessment No - Continue assessment No - Continue assessment   What is the highest level of mobility based on the progressive mobility assessment? Level 5 (Walks with assist in room/hall) - Balance while stepping forward/back and can walk in room with assist - Complete Level 5 (Walks with assist in room/hall) - Balance while stepping forward/back and can walk in room with assist - Complete Level 5 (Walks with assist in room/hall) - Balance while stepping forward/back and can walk in room with assist - Complete Level 5 (Walks with assist in room/hall) - Balance while stepping forward/back and can walk in room with assist - Complete Level 5 (Walks with assist in room/hall) - Balance while stepping forward/back and can walk in room with assist - Complete   Is the above level different from baseline mobility prior to current illness? -- Yes - Recommend PT order Yes - Recommend PT order Yes - Recommend PT order Yes - Recommend PT order    Row Name 08/03/22 2045 08/03/22 1731         Does patient have an order for  bedrest or is patient medically unstable No - Continue assessment No - Continue assessment      What is the highest level of mobility based on the progressive mobility assessment? Level 4 (Walks with assist in room) - Balance while marching in place and cannot step forward and back - Complete Level 4 (Walks with assist in room) - Balance while marching in place and cannot step forward and back - Complete      Is the above level different from baseline mobility prior to current illness? Yes - Recommend PT order --                Time: 50 minutes        Care during the described time interval was provided by me .  I have reviewed this patient's available data, including medical history, events of note, physical examination, and all test results as part of my evaluation.

## 2022-08-06 NOTE — Progress Notes (Incomplete)
Frank Meyer LZJ:[id number] DOB: 1941-04-08 DOA: 08/02/2022 PCP: Burnard Bunting, MD   Subj: 81 y.o. married WM PMHx independent, with medical history significant for chronic hypoxic respiratory failure on 5 to 6 L continuous nasal cannula, severe COPD, chronic diastolic heart failure (LVEF 50-55% in January 2023), CKD stage IIIa (Baseline creatinine 1.2-1.5), recent hospitalization 07/20/2022 - 07/22/2022 for community-acquired pneumonia, COPD exacerbation at which time CT chest had shown bilateral lower lobe opacities, treated with ceftriaxone, doxycycline and discharged on doxycycline with prednisone taper,   Presented to the ED on 08/02/2022 with worsening dyspnea.  Initially needed nonrebreather mask followed by several hours of Ventimask and then titrated to [place] oxygen 5 L/min.  Admitted for acute on chronic respiratory failure with hypoxia due to COPD exacerbation and pneumonia.  Improving.    Obj: 12/21 A/O x 4, afebrile overnight    positive increase respiratory distress or baseline.  Patient has pulmonologist at [facility].  States has appointment on 08/25/2022   Objective: VITAL SIGNS: Temp: 98 F (36.7 C) (12/21 0310) Temp Source: Oral (12/21 0310) BP: 116/67 (12/21 0310) Pulse Rate: 58 (12/21 0310) SPO2; FIO2:   Intake/Output Summary (Last 24 hours) at 08/06/2022 0750 Last data filed at 08/06/2022 0636 Gross per 24 hour  Intake 480 ml  Output 2700 ml  Net -2220 ml      Exam: Physical Exam:  General: A/O x 4, positive acute on chronic respiratory distress, cachectic Eyes: negative scleral hemorrhage, negative anisocoria, negative icterus ENT: Negative Runny nose, negative gingival bleeding, Neck:  Negative scars, masses, torticollis, lymphadenopathy, JVD Lungs: tachypneic air movement all lung fields, without wheezes or crackles Cardiovascular: Regular rate and rhythm without murmur gallop or rub normal S1 and S2 Abdomen: negative abdominal pain, nondistended,  positive soft, bowel sounds, no rebound, no ascites, no appreciable mass Extremities: No significant cyanosis, clubbing, or edema bilateral lower extremities Skin: Negative rashes, lesions, ulcers Psychiatric:  Negative depression, negative anxiety, negative fatigue, negative mania  Central nervous system:  Cranial nerves II through XII intact, tongue/uvula midline, all extremities muscle strength 5/5, sensation intact throughout, negative dysarthria, negative expressive aphasia, negative receptive aphasia.    Mobility Assessment (last 72 hours)     Mobility Assessment     Row Name 08/06/22 0400 08/05/22 2000 08/05/22 0000 08/04/22 2000 08/03/22 2045   Does patient have an order for bedrest or is patient medically unstable No - Continue assessment No - Continue assessment No - Continue assessment No - Continue assessment No - Continue assessment   What is the highest level of mobility based on the progressive mobility assessment? Level 5 (Walks with assist in room/hall) - Balance while stepping forward/back and can walk in room with assist - Complete Level 5 (Walks with assist in room/hall) - Balance while stepping forward/back and can walk in room with assist - Complete Level 5 (Walks with assist in room/hall) - Balance while stepping forward/back and can walk in room with assist - Complete Level 5 (Walks with assist in room/hall) - Balance while stepping forward/back and can walk in room with assist - Complete Level 4 (Walks with assist in room) - Balance while marching in place and cannot step forward and back - Complete   Is the above level different from baseline mobility prior to current illness? Yes - Recommend PT order Yes - Recommend PT order Yes - Recommend PT order Yes - Recommend PT order Yes - Recommend PT order    [place] Name 08/03/22 1731  Does patient have an order for bedrest or is patient medically unstable No - Continue assessment       What is the highest level of  mobility based on the progressive mobility assessment? Level 4 (Walks with assist in room) - Balance while marching in place and cannot step forward and back - Complete                  DVT prophylaxis: SCD Code Status:  Family Communication:  Status is: Inpatient    Dispo: The patient is from: Home              Anticipated d/c is to: Home              Anticipated d/c date is: > 3 days              Patient currently is not medically stable to d/c.    Procedures/Significant Events: 12/4 CTA PE protocol  Developing infection/inflammation of bilateral lower lobes and right upper lobe superimposed on a background of emphysema. Associated bilateral trace pleural effusions. 2. Interval increase in size of a right upper lobe 1.4 x 1.4 cm (from 1 x 1.2 cm) pulmonary nodule. Additional imaging evaluation or consultation with Pulmonology or Thoracic Surgery recommended. 3. Interval development of a Nodular-like airspace opacity of the lingula measuring 1.6 x 1.4 cm. Consider one of the following in 3 months for both low-risk and high-risk individuals: (a) repeat chest CT, (b) follow-up PET-CT, or (c) tissue sampling. This recommendation follows the consensus statement: Guidelines for Management of Incidental Pulmonary Nodules Detected on CT Images: From the Fleischner Society 2017; Radiology 2017; 284:228-243. 4. Right hilar lymphadenopathy and prominent but nonenlarged mediastinal lymph nodes likely reactive in etiology. Recommend attention on follow-up. 5. Small hiatal hernia.  Consultants:  PCCM consult pending spoke with Dr. Ander Slade   Cultures    Antimicrobials: Anti-infectives (From admission, onward)    Start     Ordered Stop   08/05/22 0600  vancomycin (VANCOCIN) IVPB 1000 mg/200 mL premix  Status:  Discontinued        08/03/22 0256 08/04/22 1207   08/04/22 1300  amoxicillin-clavulanate (AUGMENTIN) 875-125 MG per tablet 1 tablet        08/04/22 1207      08/03/22 0800  ceFEPIme (MAXIPIME) 2 g in sodium chloride 0.9 % 100 mL IVPB  Status:  Discontinued        08/03/22 0256 08/04/22 1207   08/03/22 0400  vancomycin (VANCOREADY) IVPB 1500 mg/300 mL        08/03/22 0256 08/03/22 0708   08/02/22 2145  azithromycin (ZITHROMAX) 500 mg in sodium chloride 0.9 % 250 mL IVPB  Status:  Discontinued        08/02/22 2135 08/03/22 1620   08/02/22 2145  cefTRIAXone (ROCEPHIN) 1 g in sodium chloride 0.9 % 100 mL IVPB  Status:  Discontinued        08/02/22 2135 08/03/22 0252         Assessment & Plan: Covid vaccination;   Principal Problem:   Acute exacerbation of chronic obstructive pulmonary disease (COPD) ([place]) Active Problems:   BPH (benign prostatic hyperplasia)   Hyperlipidemia   Chronic diastolic CHF (congestive heart failure) ([place])   CAP (community acquired pneumonia)   High anion gap metabolic acidosis   Elevated troponin   History of seizures   Stage 3a chronic kidney disease (CKD) ([place])  Acute on chronic respiratory failure with hypoxia: At baseline patient on 5-6 L/min  [place] oxygen.  Former smoker, quit 30 years ago.  Now precipitated by COPD exacerbation and pneumonia, treatment as below.  Titrate oxygen to maintain saturations between 89-92%.  Flutter valve incentive spirometry.  Initially in ED, needed nonrebreather mask followed by several hours of Ventimask and then titrated to [place] oxygen 5 L/min.  Lactate normal.  BNP 192.  Influenza A, B, RSV and SARS coronavirus 2 by RT-PCR negative.  Currently saturating in the low to mid 90s on 5 L/min [place] oxygen at rest.  However patient states that even minimal activity at home will drop his oxygen saturations to the 70s.  And he would "fudge" his oxygen requirement and increase it as needed.  Ambulate and assess.  Hopefully his O2 requirement will stabilize in the next 24 hours.  SATURATION QUALIFICATIONS: (This note is used to comply with regulatory documentation for home oxygen)  Patient  Saturations on Room Air at Rest =  <70%  Patient Saturations on 5 Liters [place] 95%  Patient Saturations on 5 Liters of oxygen while Ambulating = 85-92%  Patient Saturations on 8 Liters of oxygen while Ambulating = 92-98%    -Patient meets criteria for home O2 - 5 L O2, titrate to maintain SpO2 89 to 93% -Patient states he believes he has a high flow tank at home check -12/21 patient does not have high flow regulator on his tank have spoken with NCM Lovena Le, who is arranging for delivery   COPD exacerbation: IV Solu-Medrol, scheduled bronchodilator nebs and as needed nebs.  Incentive spirometry and flutter valve.  VBG without CO2 retention. Follows with Dr. Silas Flood, Pulmonology and has an appointment on 08/25/2021 and is advised to keep that.  Improving.  Will reduce IV Solu-Medrol from 80 mg to 40 mg twice daily.  Prednisone taper at discharge. -12/20 changed to prednisone 60 mg daily.  Will put patient on long taper at discharge. - 12/20 DuoNeb QID - 12/20 flutter valve - 12/20 incentive spirometry -12/20 Mucinex DM BID    Pneumonia Recently completed treatment for pneumonia during prior hospitalization.  Concern for CAP versus HCAP.  Thereby antibiotics broadened to IV vancomycin and cefepime.  Stopped azithromycin since he has recently completed an atypical coverage.  Procalcitonin <0.10 x 2, argues against bacterial infectious etiology. Chest x-ray shows patchy airspace disease in the left midlung, worse than prior.  Discussed with Dr. 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  He stated that CT shows empty blebs with exudate/debris suggestive of infection which will take longer than usual antibiotic duration to clear.  This especially in a  patient with advanced COPD .-Switched to Augmentin 12/19.  Lung nodules***** - Patient with multiple lung nodules pathological in size.  Increasing in size. -CTA PE protocol was performed on previous admission, per patient nobody discussed findings or follow-up. -Consult PCCM on 12/21.  Given patient's severe emphysema high risk for any sort of intervention to obtain tissue sample. -12/21 consult to PCCM Dr. Ander Slade, agrees that obtaining a biopsy would be extremely difficult and risky.  However will see patient today, await further recommendations -12/21 ADDENDUM.PCCM recommends PET scan may be considered as the next step in evaluating the increased size of the nodule and also to assess the adenopathy -12/21 PCCM instructed patient to ensure  that he keeps appointment with Dr. Silas Flood on 08/25/2021   Chronic diastolic CHF -TTE January 2023 with LVEF 50-55% and grade 1 diastolic dysfunction.  -Treat in and out -2.3 L - Daily weight  Filed Weights   08/03/22 1659 08/04/22 0457 08/06/22 0310  Weight: 73.9 kg 73 kg 74.2 kg      Elevated troponin: HS Troponin up from 14-63-137.  No angina.  Likely due to demand ischemia from acute respiratory failure.  TTE with preserved LVEF, no regional wall motion abnormalities and no changes compared to prior study.   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   Lab Results  Component Value Date   CREATININE 0.84 08/05/2022   CREATININE 1.09 08/03/2022   CREATININE 1.43 (H) 08/02/2022   CREATININE 1.23 07/22/2022   CREATININE 1.45 (H) 07/20/2022  -Resolved   Anion gap metabolic acidosis: -Likely secondary to acute respiratory failure and AKI.   -Resolved.   BPH Continue home finasteride    Hyperlipidemia Continue atorvastatin   History of seizures: Continue Keppra.          Mobility Assessment (last 72 hours)     Mobility Assessment     Row Name 08/06/22 0400 08/05/22  2000 08/05/22 0000 08/04/22 2000 08/03/22 2045   Does patient have an order for bedrest or is patient medically unstable No - Continue assessment No - Continue assessment No - Continue assessment No - Continue assessment No - Continue assessment   What is the highest level of mobility based on the progressive mobility assessment? Level 5 (Walks with assist in room/hall) - Balance while stepping forward/back and can walk in room with assist - Complete Level 5 (Walks with assist in room/hall) - Balance while stepping forward/back and can walk in room with assist - Complete Level 5 (Walks with assist in room/hall) - Balance while stepping forward/back and can walk in room with assist - Complete Level 5 (Walks with assist in room/hall) - Balance while stepping forward/back and can walk in room with assist - Complete Level 4 (Walks with assist in room) - Balance while marching in place and cannot step forward and back - Complete   Is the above level different from baseline mobility prior to current illness? Yes - Recommend PT order Yes - Recommend PT order Yes - Recommend PT order Yes - Recommend PT order Yes - Recommend PT order    [place] Name 08/03/22 1731           Does patient have an order for bedrest or is patient medically unstable No - Continue assessment       What is the highest level of mobility based on the progressive mobility assessment? Level 4 (Walks with assist in room) - Balance while marching in place and cannot step forward and back - Complete                 Time: 50 minutes        Care during the described time interval was provided by me .  I have reviewed this patient's available data, including medical history, events of note, physical examination, and all test results as part of my evaluation.

## 2022-08-06 NOTE — Consult Note (Addendum)
NAME:  Frank Meyer, MRN:  [id number], DOB:  [birth year], LOS: 4 ADMISSION DATE:  08/02/2022, CONSULTATION DATE: 08/06/2022 REFERRING MD: Dr. Sherral Hammers, CHIEF COMPLAINT: Lung Nodules  History of Present Illness:  Asked to see patient for CT scan findings of lung nodules  Patient known to Dr. Silas Flood in the pulmonary clinic, aware of lung nodules.  Known to have interstitial lung disease, pulmonary hypertension, extensive emphysema  Nodule that was present previously had been stable but there is increase in size in the right upper lobe nodule, lingula nodular change, interstitial haziness  Was recently treated for community-acquired pneumonia early December-completed course of doxycycline, ceftriaxone, prednisone taper. Admitted with worsening shortness of breath  States his breathing is feeling better at present  Pertinent  Medical History   Past Medical History:  Diagnosis Date   Allergic rhinitis 10/24/2009   Altered bowel habits 10/04/2020   Basal cell carcinoma    Benign prostatic hyperplasia with lower urinary tract symptoms 10/24/2009   Benign prostatic hypertrophy    Bradycardia 12/06/2019   Callosity 10/16/2013   Cardiomegaly 01/23/2015   Chronic diastolic heart failure ([place]) 12/19/2018   Chronic respiratory failure ([place]) 10/04/2020   Coronary artery calcification seen on CAT scan 05/28/2020   Dyslipidemia    Dyspnea on exertion 03/18/2018   Emphysema lung ([place]) 03/18/2018   Encounter for general adult medical examination without abnormal findings 01/17/2015   Ex-cigarette smoker 01/20/2021   Exudative age-related macular degeneration of left eye with active choroidal neovascularization ([place]) 12/18/2019   Gallbladder problem    Gastroesophageal reflux disease    Hiatal hernia    Hilar adenopathy 03/25/2018   History of smoking 03/18/2018   Hyperlipidemia 10/24/2009   Insomnia 05/09/2013   Intermediate stage nonexudative age-related macular degeneration of right  eye 07/30/2020   Internal hemorrhoid 10/04/2020   Macular degeneration    Macular pucker, right eye 04/30/2020   Mediastinal adenopathy 03/25/2018   Memory loss 05/09/2013   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   Mild memory disturbance    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   Noninfective gastroenteritis and colitis, unspecified 10/04/2020   Nuclear sclerotic cataract of right eye 12/18/2019   Other specified symptoms and signs involving the digestive system and abdomen 10/04/2020   Overweight 09/14/2018   Oxygen dependent 10/04/2020   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   Personal history of colonic polyps 10/04/2020   Posterior vitreous detachment of right eye 12/18/2019   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   Pulmonary hypertension ([place])    Right upper lobe pulmonary nodule 03/25/2018   Syncope and collapse 12/12/2018   TIA (transient ischemic attack)    Transient ischemic attack 10/24/2009   Weight decreased 10/04/2020   Significant Hospital Events: Including procedures, antibiotic start and stop dates in addition to other pertinent events   CT chest 07/20/2022 reviewed by myself-mediastinal adenopathy, extensive interstitial infiltrate, fibrotic changes, extensive emphysema Interim History / Subjective:  Shortness of breath Still has a congested cough  Objective   Blood pressure 116/67, pulse 62, temperature 98 F (36.7 C), temperature source Oral, resp. rate 18, height '5\' 6"'$  (1.676 m), weight 74.2 kg, SpO2 96 %.    FiO2 (%):  [40 %] 40 %   Intake/Output Summary (Last 24 hours) at 08/06/2022 0949 Last data filed at 08/06/2022 0254 Gross per 24 hour  Intake 1320 ml  Output 2700 ml  Net -1380 ml   Filed Weights   08/03/22 1659 08/04/22 0457 08/06/22 0310  Weight: 73.9 kg  73 kg 74.2 kg    Examination: General: Elderly, does not appear to be in extremis on oxygen supplementation HENT: Oral mucosa Lungs: Decreased air  movement, no wheezing or rales appreciated today Cardiovascular: S1-S2 appreciated Abdomen: Soft, bowel sounds appreciated Extremities: No clubbing, no edema Neuro: Alert and oriented x 3, moving all extremities GU:    PFT November 2021 shows moderately severe reduction in diffusing capacity  Echo from 08/03/2022 shows ejection fraction of 55 to 60%  Resolved Hospital Problem list     Assessment & Plan:  Right upper lobe nodular changes Lingula nodule -Right upper lobe nodular change is progressed compared to previous from a year ago which I did review -Lingula change is new -Patient with very extensive emphysematous changes and likely scarring -This may be interstitial lung disease with emphysema  He does have a history of pulmonary hypertension  Chronic respiratory failure on 5 L of oxygen at baseline  Risk with any invasive intervention is very high with the risk of pneumothorax and fistula formation with biopsy  A PET scan may be considered as the next step in evaluating the increased size of the nodule and also to assess the adenopathy  Patient already has an appointment with Dr. Silas Flood 08/25/2021  -No acute/immediate pulmonary intervention needed at present -Continue management of his current exacerbation  I did stress to him to make sure that he follows up with Dr. Silas Flood and further decisions on discussions regarding the next step may be made at that time  Call back as needed I will sign off at present   Sherrilyn Rist, MD [place] [facility] Pager: See Shea Evans

## 2022-08-07 ENCOUNTER — Other Ambulatory Visit (HOSPITAL_COMMUNITY): Payer: Self-pay

## 2022-08-07 DIAGNOSIS — J441 Chronic obstructive pulmonary disease with (acute) exacerbation: Secondary | ICD-10-CM | POA: Diagnosis not present

## 2022-08-07 DIAGNOSIS — I5032 Chronic diastolic (congestive) heart failure: Secondary | ICD-10-CM | POA: Diagnosis not present

## 2022-08-07 DIAGNOSIS — N1831 Chronic kidney disease, stage 3a: Secondary | ICD-10-CM | POA: Diagnosis not present

## 2022-08-07 DIAGNOSIS — N400 Enlarged prostate without lower urinary tract symptoms: Secondary | ICD-10-CM | POA: Diagnosis not present

## 2022-08-07 LAB — COMPREHENSIVE METABOLIC PANEL
ALT: 98 U/L — ABNORMAL HIGH (ref 0–44)
AST: 52 U/L — ABNORMAL HIGH (ref 15–41)
Albumin: 2.6 g/dL — ABNORMAL LOW (ref 3.5–5.0)
Alkaline Phosphatase: 45 U/L (ref 38–126)
Anion gap: 6 (ref 5–15)
BUN: 22 mg/dL (ref 8–23)
CO2: 24 mmol/L (ref 22–32)
Calcium: 8.4 mg/dL — ABNORMAL LOW (ref 8.9–10.3)
Chloride: 109 mmol/L (ref 98–111)
Creatinine, Ser: 0.97 mg/dL (ref 0.61–1.24)
GFR, Estimated: >60 mL/min (ref >60)
Glucose, Bld: 157 mg/dL — ABNORMAL HIGH (ref 70–99)
Potassium: 4.4 mmol/L (ref 3.5–5.1)
Sodium: 139 mmol/L (ref 135–145)
Total Bilirubin: 0.7 mg/dL (ref 0.3–1.2)
Total Protein: 5.4 g/dL — ABNORMAL LOW (ref 6.5–8.1)

## 2022-08-07 LAB — CBC WITH DIFFERENTIAL/PLATELET
Abs Immature Granulocytes: 0.16 10*3/uL — ABNORMAL HIGH (ref 0.00–0.07)
Basophils Absolute: 0 10*3/uL (ref 0.0–0.1)
Basophils Relative: 0 %
Eosinophils Absolute: 0 10*3/uL (ref 0.0–0.5)
Eosinophils Relative: 0 %
HCT: 37.2 % — ABNORMAL LOW (ref 39.0–52.0)
Hemoglobin: 11.8 g/dL — ABNORMAL LOW (ref 13.0–17.0)
Immature Granulocytes: 1 %
Lymphocytes Relative: 4 %
Lymphs Abs: 0.6 10*3/uL — ABNORMAL LOW (ref 0.7–4.0)
MCH: 27.1 pg (ref 26.0–34.0)
MCHC: 31.7 g/dL (ref 30.0–36.0)
MCV: 85.5 fL (ref 80.0–100.0)
Monocytes Absolute: 0.5 10*3/uL (ref 0.1–1.0)
Monocytes Relative: 3 %
Neutro Abs: 14.5 10*3/uL — ABNORMAL HIGH (ref 1.7–7.7)
Neutrophils Relative %: 92 %
Platelets: 200 10*3/uL (ref 150–400)
RBC: 4.35 MIL/uL (ref 4.22–5.81)
RDW: 15 % (ref 11.5–15.5)
WBC: 15.8 10*3/uL — ABNORMAL HIGH (ref 4.0–10.5)
nRBC: 0 % (ref 0.0–0.2)

## 2022-08-07 LAB — PHOSPHORUS: Phosphorus: 3 mg/dL (ref 2.5–4.6)

## 2022-08-07 LAB — MAGNESIUM: Magnesium: 2.3 mg/dL (ref 1.7–2.4)

## 2022-08-07 MED ORDER — PREDNISONE 10 MG PO TABS
ORAL_TABLET | ORAL | 0 refills | Status: DC
  Filled 2022-08-07: qty 53, 28d supply, fill #0

## 2022-08-07 MED ORDER — PREDNISONE 10 MG PO TABS
ORAL_TABLET | ORAL | 0 refills | Status: DC
  Filled 2022-08-07: qty 7, 7d supply, fill #0

## 2022-08-07 MED ORDER — DM-GUAIFENESIN ER 30-600 MG PO TB12
1.0000 | ORAL_TABLET | Freq: Two times a day (BID) | ORAL | 0 refills | Status: DC
  Filled 2022-08-07: qty 20, 10d supply, fill #0

## 2022-08-07 MED ORDER — IPRATROPIUM-ALBUTEROL 0.5-2.5 (3) MG/3ML IN SOLN
3.0000 mL | Freq: Four times a day (QID) | RESPIRATORY_TRACT | 0 refills | Status: DC | PRN
  Filled 2022-08-07: qty 180, 15d supply, fill #0

## 2022-08-07 MED ORDER — PREDNISONE 50 MG PO TABS
ORAL_TABLET | ORAL | 0 refills | Status: DC
  Filled 2022-08-07: qty 8, 7d supply, fill #0

## 2022-08-07 MED ORDER — AMOXICILLIN-POT CLAVULANATE 875-125 MG PO TABS
1.0000 | ORAL_TABLET | Freq: Two times a day (BID) | ORAL | 0 refills | Status: AC
  Filled 2022-08-07: qty 58, 29d supply, fill #0

## 2022-08-07 MED ORDER — PREDNISONE 5 MG PO TABS
ORAL_TABLET | ORAL | 0 refills | Status: DC
  Filled 2022-08-07: qty 7, 7d supply, fill #0

## 2022-08-07 NOTE — Discharge Summary (Signed)
Physician Discharge Summary  Frank Meyer XBJ:[id number] DOB: [birth year] DOA: 08/02/2022  PCP: Burnard Bunting, MD  Admit date: 08/02/2022 Discharge date: 08/07/2022  Time spent: 35 minutes  Recommendations for Outpatient Follow-up: Acute on chronic respiratory failure with hypoxia: -Baseline for -6 L/min [place] oxygen.   -Former smoker, quit 30 years ago.      SATURATION QUALIFICATIONS: (This note is used to comply with regulatory documentation for home oxygen)  Patient Saturations on Room Air at Rest =  <70%  Patient Saturations on 5 Liters [place] 95%  Patient Saturations on 5 Liters of oxygen while Ambulating = 85-92%  Patient Saturations on 8 Liters of oxygen while Ambulating = 92-98%    -Patient meets criteria for home O2 - 5 L O2, titrate to maintain SpO2 89 to 93% -Patient states he believes he has a high flow tank at home check -12/21 requested nebulizer machine for discharge.   COPD exacerbation: -IV Solu-Medrol, scheduled bronchodilator nebs and as needed nebs.  Incentive spirometry and flutter valve.  VBG without CO2 retention. Follows with Dr. Silas Flood, Pulmonology and has an appointment on 08/25/2021 and is advised to keep that.  Improving.  Will reduce IV Solu-Medrol from 80 mg to 40 mg twice daily.  Prednisone taper at discharge. -12/20 changed to prednisone 60 mg daily.  Will put patient on long taper at discharge. - 12/20 DuoNeb QID - 12/20 flutter valve - 12/20 incentive spirometry -12/20 Mucinex DM BID -12/22 [employer] to bring nebulizer machine to bedside prior to discharge - 12/22 Prednisone taper of '40mg'$  QD w/ breakfast x 7days, '20mg'$  QD x7 days, '10mg'$  QD x 7 days and '5mg'$  QD x 7    Pneumonia -Recently completed treatment for pneumonia during prior hospitalization.   -Concern for CAP versus HCAP.  Thereby antibiotics broadened to IV vancomycin and cefepime.   -HYQ:MVHQI patchy airspace disease in the left midlung, worse than prior.   -12/19 discussed with Dr. Gale Journey,  infectious disease MD on-call.  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   Lung nodules - Patient with multiple lung nodules pathological in size.  Increasing in size. -CTA PE protocol was performed on previous admission, per patient nobody discussed findings or follow-up. -Consult PCCM on 12/21.  Given patient's severe emphysema high risk for any sort of intervention to obtain tissue sample. -12/21 per PCCM concurs any invasive intervention high risk of pneumothorax and fistula formation with biopsy. - 12/21 PCCM recommend obtain outpatient PET scan -Counseled patient on importance of following up with Dr. Silas Flood on 09/04/2022    Chronic diastolic CHF -TTE January 2023 with LVEF 50-55% and grade 1 diastolic dysfunction.  - Strict in and out -2.6 L - Daily weight  Filed Weights   08/04/22 0457 08/06/22 0310 08/07/22 0439  Weight: 73 kg 74.2 kg 72.8 kg    Elevated troponin: HS Troponin up from 14-63-137.  No angina.  Likely due to demand ischemia from acute respiratory failure.  TTE with preserved LVEF, no regional wall motion abnormalities and no changes compared to prior study.   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   Lab Results  Component Value Date   CREATININE 0.97 08/07/2022   CREATININE 0.98 08/06/2022   CREATININE 0.84 08/05/2022   CREATININE 1.09 08/03/2022   CREATININE 1.43 (H) 08/02/2022  -  Resolved   Anion gap metabolic acidosis: Likely secondary to acute respiratory failure and AKI.   -Resolved.   BPH -Finasteride 5 mg daily    Hyperlipidemia -Atorvastatin 20 mg daily   History of seizures: -Keppra 250 mg daily/500 mg qhs.      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  (COPD) ([place]) Active Problems:   BPH (benign prostatic hyperplasia)   Hyperlipidemia   Chronic diastolic CHF (congestive heart failure) ([place])   CAP (community acquired pneumonia)   High anion gap metabolic acidosis   Elevated troponin   History of seizures   Stage 3a chronic kidney disease (CKD) ([place])   Discharge Condition: ***  Diet recommendation: ***  Filed Weights   08/04/22 0457 08/06/22 0310 08/07/22 0439  Weight: 73 kg 74.2 kg 72.8 kg    History of present illness:  [facility] Course:  ***  Procedures: 12/4 CTA PE protocol  Developing infection/inflammation of bilateral lower lobes and right upper lobe superimposed on a background of emphysema. Associated bilateral trace pleural effusions. 2. Interval increase in size of a right upper lobe 1.4 x 1.4 cm (from 1 x 1.2 cm) pulmonary nodule. Additional imaging evaluation or consultation with Pulmonology or Thoracic Surgery recommended. 3. Interval development of a Nodular-like airspace opacity of the lingula measuring 1.6 x 1.4 cm. Consider one of the following in 3 months for both low-risk and high-risk individuals: (a) repeat chest CT, (b) follow-up PET-CT, or (c) tissue sampling. This recommendation follows the consensus statement: Guidelines for Management of Incidental Pulmonary Nodules Detected on CT Images: From the Fleischner Society 2017; Radiology 2017; 284:228-243. 4. Right hilar lymphadenopathy and prominent but nonenlarged mediastinal lymph nodes likely reactive in etiology. Recommend attention on follow-up. 5. Small hiatal hernia.   Consultations: PCCM    Cultures  ***  Antibiotics Anti-infectives (From admission, onward)    Start     Ordered Stop   08/05/22 0600  vancomycin (VANCOCIN) IVPB 1000 mg/200 mL premix  Status:  Discontinued        08/03/22 0256 08/04/22 1207   08/04/22 1300  amoxicillin-clavulanate (AUGMENTIN) 875-125 MG per tablet 1 tablet        08/04/22 1207 09/05/22 2359    08/03/22 0800  ceFEPIme (MAXIPIME) 2 g in sodium chloride 0.9 % 100 mL IVPB  Status:  Discontinued        08/03/22 0256 08/04/22 1207   08/03/22 0400  vancomycin (VANCOREADY) IVPB 1500 mg/300 mL        08/03/22 0256 08/03/22 0708   08/02/22 2145  azithromycin (ZITHROMAX) 500 mg in sodium chloride 0.9 % 250 mL IVPB  Status:  Discontinued        08/02/22 2135 08/03/22 1620   08/02/22 2145  cefTRIAXone (ROCEPHIN) 1 g in sodium chloride 0.9 % 100 mL IVPB  Status:  Discontinued        08/02/22 2135 08/03/22 0252          Discharge Exam: Vitals:   08/06/22 1033 08/06/22 1951 08/06/22 2214 08/07/22 0439  BP:  (!) 121/90  115/63  Pulse:    60  Resp:  19  19  Temp:  98 F (36.7 C)  97.7 F (36.5 C)  TempSrc:  Oral  Oral  SpO2: 90% 90% 92% 97%  Weight:    72.8 kg  Height:        General: *** Cardiovascular: *** Respiratory: ***  Discharge Instructions   Allergies as of 08/07/2022  No Known Allergies      Medication List     STOP taking these medications    guaiFENesin 600 MG 12 hr tablet Commonly known as: MUCINEX       TAKE these medications    amoxicillin-clavulanate 875-125 MG tablet Commonly known as: AUGMENTIN Take 1 tablet by mouth every 12 (twelve) hours.   Anoro Ellipta 62.5-25 MCG/ACT Aepb Generic drug: umeclidinium-vilanterol Inhale 1 puff into the lungs daily.   aspirin EC 81 MG tablet Take 81 mg by mouth daily.   atorvastatin 20 MG tablet Commonly known as: LIPITOR Take 20 mg by mouth every evening.   cholecalciferol 1000 units tablet Commonly known as: VITAMIN D Take 1,000 Units by mouth daily.   dextromethorphan-guaiFENesin 30-600 MG 12hr tablet Commonly known as: MUCINEX DM Take 1 tablet by mouth 2 (two) times daily.   donepezil 10 MG tablet Commonly known as: ARICEPT Take 1 tablet (10 mg total) by mouth daily. What changed: when to take this   Entresto 24-26 MG Generic drug: sacubitril-valsartan Take 1 tablet by mouth  daily. What changed:  when to take this additional instructions   fexofenadine 180 MG tablet Commonly known as: ALLEGRA Take 180 mg by mouth daily.   finasteride 5 MG tablet Commonly known as: PROSCAR Take 5 mg by mouth at bedtime.   ipratropium 0.06 % nasal spray Commonly known as: ATROVENT Place 1-2 sprays into both nostrils See admin instructions. 2 sprays in the right nostril and 1 spray in the left nostril once daily   ipratropium-albuterol 0.5-2.5 (3) MG/3ML Soln Commonly known as: DUONEB Take 3 mLs by nebulization every 6 (six) hours as needed.   levETIRAcetam 250 MG tablet Commonly known as: Keppra Take 1-2 tablets (250-500 mg total) by mouth See admin instructions. Take 250 mg by mouth in the morning and 500 mg by mouth at night. What changed: additional instructions   Melatonin 10 MG Tabs Take 20 mg by mouth at bedtime.   nitroGLYCERIN 0.4 MG SL tablet Commonly known as: NITROSTAT Place 1 tablet (0.4 mg total) under the tongue every 5 (five) minutes as needed. Chest pain What changed: reasons to take this   omeprazole 40 MG capsule Commonly known as: PRILOSEC Take 40 mg by mouth daily.   OXYGEN Inhale 2-5 L into the lungs as directed. 5lt during the day and 2lt at night   predniSONE 50 MG tablet Commonly known as: DELTASONE 1 tablet p.o. with breakfast   predniSONE 50 MG tablet Commonly known as: DELTASONE 1/2 tablet every morning with breakfast Start taking on: August 14, 2022   predniSONE 10 MG tablet Commonly known as: DELTASONE 1 tablet p.o. with breakfast Start taking on: August 22, 2022   predniSONE 5 MG tablet Commonly known as: DELTASONE 1 tablet p.o. with breakfast Start taking on: August 29, 2022   thiamine 100 MG tablet Commonly known as: VITAMIN B1 Take 1 tablet (100 mg total) by mouth daily.   traZODone 100 MG tablet Commonly known as: DESYREL Take 2 tablets (200 mg total) by mouth at bedtime.               Durable  Medical Equipment  (From admission, onward)           Start     Ordered   08/06/22 1644  For home use only DME Nebulizer machine  Once       Question Answer Comment  Patient needs a nebulizer to treat with the following condition Chronic respiratory failure  with hypoxia ([place])   Length of Need Lifetime      08/06/22 1643   08/05/22 1518  For home use only DME oxygen  Once       Comments: SATURATION QUALIFICATIONS: (This note is used to comply with regulatory documentation for home oxygen)  Patient Saturations on Room Air at Rest =  <70%  Patient Saturations on 5 Liters [place] 95%  Patient Saturations on 5 Liters of oxygen while Ambulating = 85-92%  Patient Saturations on 8 Liters of oxygen while Ambulating = 92-98% 5 L O2, titrate to maintain SpO2 89 to 93%  Question Answer Comment  Length of Need Lifetime   Mode or (Route) Nasal cannula   Liters per Minute 5   Frequency Continuous (stationary and portable oxygen unit needed)   Oxygen conserving device Yes   Oxygen delivery system Gas      08/05/22 1518           No Known Allergies  Follow-up Information     [facility] [place] Follow up.   Specialty: [place] Why: Agency will call you to set up apt times Contact information: [place] [address] [phone]                  The results of significant diagnostics from this hospitalization (including imaging, microbiology, ancillary and laboratory) are listed below for reference.    Significant Diagnostic Studies: ECHOCARDIOGRAM COMPLETE  Result Date: 08/03/2022    ECHOCARDIOGRAM REPORT   Patient Name:   ISHAM SMITHERMAN [facility] Date of Exam: 08/03/2022 Medical Rec #:  [id number]          Height:       66.0 in Accession #:    [id number]         Weight:       160.0 lb Date of Birth:  06/22/1941           BSA:          1.819 m Patient Age:    81 years           BP:           103/65 mmHg Patient Gender: M                  HR:            80 bpm. Exam Location:  Inpatient Procedure: 2D Echo, Color Doppler and Cardiac Doppler Indications:     Shortness of Breath  History:         Patient has prior history of Echocardiogram examinations. COPD;                  Signs/Symptoms:Shortness of Breath.  Sonographer:     Ula Lingo RDCS (AE, PE) Referring Phys:  [id number] Rhetta Mura Diagnosing Phys: Eleonore Chiquito MD IMPRESSIONS  1. Left ventricular ejection fraction, by estimation, is 55 to 60%. The left ventricle has normal function. The left ventricle has no regional wall motion abnormalities. Left ventricular diastolic parameters are consistent with Grade I diastolic dysfunction (impaired relaxation).  2. Right ventricular systolic function is normal. The right ventricular size is mildly enlarged. Tricuspid regurgitation signal is inadequate for assessing PA pressure.  3. Left atrial size was mild to moderately dilated.  4. The mitral valve is grossly normal. Trivial mitral valve regurgitation. No evidence of mitral stenosis.  5. The aortic valve is tricuspid. Aortic valve regurgitation is trivial. Aortic valve sclerosis is present,  with no evidence of aortic valve stenosis. Comparison(s): No significant change from prior study. FINDINGS  Left Ventricle: Left ventricular ejection fraction, by estimation, is 55 to 60%. The left ventricle has normal function. The left ventricle has no regional wall motion abnormalities. The left ventricular internal cavity size was normal in size. There is  no left ventricular hypertrophy. Left ventricular diastolic parameters are consistent with Grade I diastolic dysfunction (impaired relaxation). Right Ventricle: The right ventricular size is mildly enlarged. No increase in right ventricular wall thickness. Right ventricular systolic function is normal. Tricuspid regurgitation signal is inadequate for assessing PA pressure. Left Atrium: Left atrial size was mild to moderately dilated. Right Atrium: Right  atrial size was normal in size. Pericardium: Trivial pericardial effusion is present. Mitral Valve: The mitral valve is grossly normal. Trivial mitral valve regurgitation. No evidence of mitral valve stenosis. Tricuspid Valve: The tricuspid valve is grossly normal. Tricuspid valve regurgitation is trivial. No evidence of tricuspid stenosis. Aortic Valve: The aortic valve is tricuspid. Aortic valve regurgitation is trivial. Aortic regurgitation PHT measures 763 msec. Aortic valve sclerosis is present, with no evidence of aortic valve stenosis. Pulmonic Valve: The pulmonic valve was grossly normal. Pulmonic valve regurgitation is not visualized. No evidence of pulmonic stenosis. Aorta: The aortic root and ascending aorta are structurally normal, with no evidence of dilitation. Venous: The inferior vena cava was not well visualized. IAS/Shunts: The atrial septum is grossly normal.  LEFT VENTRICLE PLAX 2D LVIDd:         4.90 cm      Diastology LVIDs:         3.00 cm      LV e' medial:    6.42 cm/s LV PW:         0.80 cm      LV E/e' medial:  11.1 LV IVS:        0.80 cm      LV e' lateral:   5.66 cm/s LVOT diam:     2.00 cm      LV E/e' lateral: 12.5 LV SV:         66 LV SV Index:   36 LVOT Area:     3.14 cm  LV Volumes (MOD) LV vol d, MOD A2C: 57.6 ml LV vol d, MOD A4C: 102.6 ml LV vol s, MOD A2C: 38.1 ml LV vol s, MOD A4C: 35.6 ml LV SV MOD A2C:     19.5 ml LV SV MOD A4C:     102.6 ml LV SV MOD BP:      44.3 ml RIGHT VENTRICLE RV S prime:     22.30 cm/s TAPSE (M-mode): 2.4 cm LEFT ATRIUM             Index        RIGHT ATRIUM           Index LA diam:        4.30 cm 2.36 cm/m   RA Area:     25.50 cm LA Vol (A2C):   57.5 ml 31.61 ml/m  RA Volume:   86.10 ml  47.34 ml/m LA Vol (A4C):   76.9 ml 42.28 ml/m LA Biplane Vol: 67.0 ml 36.84 ml/m  AORTIC VALVE LVOT Vmax:   117.00 cm/s LVOT Vmean:  80.200 cm/s LVOT VTI:    0.211 m AI PHT:      763 msec  AORTA Ao Root diam: 3.40 cm Ao Asc diam:  3.40 cm MITRAL VALVE MV Area  (PHT): 3.95  cm    SHUNTS MV Decel Time: 192 msec    Systemic VTI:  0.21 m MV E velocity: 71.00 cm/s  Systemic Diam: 2.00 cm MV A velocity: 82.50 cm/s MV E/A ratio:  0.86 Eleonore Chiquito MD Electronically signed by Eleonore Chiquito MD Signature Date/Time: 08/03/2022/12:15:26 PM    Final (Updated)    DG Chest 1 View  Result Date: 08/02/2022 CLINICAL DATA:  Shortness of breath EXAM: CHEST  1 VIEW COMPARISON:  Chest x-ray 07/20/2022.  Chest CT 07/20/2022. FINDINGS: The heart is enlarged. Aorta is ectatic, unchanged. There are areas of scarring bilaterally which appears similar to the prior study. Bullous change again noted in the left lung apex. There some patchy airspace opacities in the left mid lung, mildly increased. There is no pleural effusion or pneumothorax. No acute fractures are seen. There surgical clips in the upper abdomen. IMPRESSION: 1. Patchy airspace opacities in the left mid lung, mildly increased since the prior study. This could be infectious or inflammatory. 2. Stable cardiomegaly. 3. Stable areas of scarring bilaterally. Electronically Signed   By: Ronney Asters M.D.   On: 08/02/2022 21:17   CT Angio Chest PE W and/or Wo Contrast  Result Date: 07/20/2022 CLINICAL DATA:  hypoxia EXAM: CT ANGIOGRAPHY CHEST WITH CONTRAST TECHNIQUE: Multidetector CT imaging of the chest was performed using the standard protocol during bolus administration of intravenous contrast. Multiplanar CT image reconstructions and MIPs were obtained to evaluate the vascular anatomy. RADIATION DOSE REDUCTION: This exam was performed according to the departmental dose-optimization program which includes automated exposure control, adjustment of the mA and/or kV according to patient size and/or use of iterative reconstruction technique. CONTRAST:  3m OMNIPAQUE IOHEXOL 350 MG/ML SOLN COMPARISON:  None Available. FINDINGS: Cardiovascular: Satisfactory opacification of the pulmonary arteries to the segmental level. No evidence  of pulmonary embolism. Artifact along the right upper lobe segmental pulmonary arteries (5:92, 8:74) With no definite filling defect to suggest true pulmonary embolus. Normal heart size. No significant pericardial effusion. The thoracic aorta is normal in caliber. Mild atherosclerotic plaque of the thoracic aorta. At least 2 vessel coronary artery calcifications. Prominent but nonenlarged mediastinal Mediastinum/Nodes: Lymph nodes. There is an enlarged 1.8 cm right hilar lymph node (5:118). No enlarged mediastinal or axillary lymph nodes. Thyroid gland, trachea, and esophagus demonstrate no significant findings. Small hiatal hernia. Lungs/Pleura: Paraseptal and centrilobular moderate severe emphysematous changes. Interval development of ground-glass and consolidative airspace opacities of the bilateral lower lobes. Similar patchy consolidative findings of the right upper lobe. Persistent pleural/pulmonary scarring at the right apex. Interval development of a nodular-like airspace opacity of the lingula measuring 1.6 x 1.4 cm (6:48, 7:65). Interval increase in size of a right upper lobe 1.4 x 1.4 cm (from 1 x 1.2 cm) pulmonary nodule (6:22, 7:66). . No pulmonary mass. Bilateral trace pleural effusions. No pneumothorax. Upper Abdomen: No acute abnormality. Sing enhancing 0.8 cm lesion within the right posterior hepatic lobe likely representing a flash filling hemangioma. Surgical clips noted within the upper abdomen. Musculoskeletal: No chest wall abnormality. No suspicious lytic or blastic osseous lesions. No acute displaced fracture. Multilevel at least mild degenerative changes of the spine. T2 vertebral body hemangioma. Review of the MIP images confirms the above findings. IMPRESSION: 1. Developing infection/inflammation of bilateral lower lobes and right upper lobe superimposed on a background of emphysema. Associated bilateral trace pleural effusions. 2. Interval increase in size of a right upper lobe 1.4 x 1.4  cm (from 1 x 1.2 cm) pulmonary nodule. Additional imaging  evaluation or consultation with Pulmonology or Thoracic Surgery recommended. 3. Interval development of a Nodular-like airspace opacity of the lingula measuring 1.6 x 1.4 cm. Consider one of the following in 3 months for both low-risk and high-risk individuals: (a) repeat chest CT, (b) follow-up PET-CT, or (c) tissue sampling. This recommendation follows the consensus statement: Guidelines for Management of Incidental Pulmonary Nodules Detected on CT Images: From the Fleischner Society 2017; Radiology 2017; 284:228-243. 4. Right hilar lymphadenopathy and prominent but nonenlarged mediastinal lymph nodes likely reactive in etiology. Recommend attention on follow-up. 5. Small hiatal hernia. Electronically Signed   By: Iven Finn M.D.   On: 07/20/2022 14:53   DG Chest Portable 1 View  Result Date: 07/20/2022 CLINICAL DATA:  Low oxygen saturations EXAM: PORTABLE CHEST 1 VIEW COMPARISON:  Chest radiograph 05/28/2022 and CT chest 06/27/2021. FINDINGS: The cardiomediastinal silhouette is grossly stable allowing for leftward patient rotation on the current study. There are peripheral reticular opacities in both lung bases which are overall stable from the prior study consistent with underlying fibrotic lung disease. There is no convincing acute airspace opacity. There is no pulmonary edema. There is no significant pleural effusion. There is no appreciable pneumothorax There is no displaced rib fracture or other acute osseous abnormality. IMPRESSION: Peripheral reticular opacities in both lungs consistent with underlying chronic fibrotic lung disease. No convincing acute airspace opacity. Electronically Signed   By: Valetta Mole M.D.   On: 07/20/2022 12:27    Microbiology: Recent Results (from the past 240 hour(s))  Resp panel by RT-PCR (RSV, Flu A&B, Covid) Anterior Nasal Swab     Status: None   Collection Time: 08/02/22  8:07 PM   Specimen: Anterior  Nasal Swab  Result Value Ref Range Status   SARS Coronavirus 2 by RT PCR NEGATIVE NEGATIVE Final    Comment: (NOTE) SARS-CoV-2 target nucleic acids are NOT DETECTED.  The SARS-CoV-2 RNA is generally detectable in upper respiratory specimens during the acute phase of infection. The lowest concentration of SARS-CoV-2 viral copies this assay can detect is 138 copies/mL. A negative result does not preclude SARS-Cov-2 infection and should not be used as the sole basis for treatment or other patient management decisions. A negative result may occur with  improper specimen collection/handling, submission of specimen other than nasopharyngeal swab, presence of viral mutation(s) within the areas targeted by this assay, and inadequate number of viral copies(<138 copies/mL). A negative result must be combined with clinical observations, patient history, and epidemiological information. The expected result is Negative.  Fact Sheet for Patients:  EntrepreneurPulse.com.au  Fact Sheet for Healthcare Providers:  IncredibleEmployment.be  This test is no t yet approved or cleared by the [place] FDA and  has been authorized for detection and/or diagnosis of SARS-CoV-2 by FDA under an Emergency Use Authorization (EUA). This EUA will remain  in effect (meaning this test can be used) for the duration of the COVID-19 declaration under Section 564(b)(1) of the Act, 21 U.S.C.section 360bbb-3(b)(1), unless the authorization is terminated  or revoked sooner.       Influenza A by PCR NEGATIVE NEGATIVE Final   Influenza B by PCR NEGATIVE NEGATIVE Final    Comment: (NOTE) The Xpert Xpress SARS-CoV-2/FLU/RSV plus assay is intended as an aid in the diagnosis of influenza from Nasopharyngeal swab specimens and should not be used as a sole basis for treatment. Nasal washings and aspirates are unacceptable for Xpert Xpress SARS-CoV-2/FLU/RSV testing.  Fact Sheet for  Patients: EntrepreneurPulse.com.au  Fact Sheet for Healthcare Providers:  IncredibleEmployment.be  This test is not yet approved or cleared by the [place] and has been authorized for detection and/or diagnosis of SARS-CoV-2 by FDA under an Emergency Use Authorization (EUA). This EUA will remain in effect (meaning this test can be used) for the duration of the COVID-19 declaration under Section 564(b)(1) of the Act, 21 U.S.C. section 360bbb-3(b)(1), unless the authorization is terminated or revoked.     Resp Syncytial Virus by PCR NEGATIVE NEGATIVE Final    Comment: (NOTE) Fact Sheet for Patients: EntrepreneurPulse.com.au  Fact Sheet for Healthcare Providers: IncredibleEmployment.be  This test is not yet approved or cleared by the [place] FDA and has been authorized for detection and/or diagnosis of SARS-CoV-2 by FDA under an Emergency Use Authorization (EUA). This EUA will remain in effect (meaning this test can be used) for the duration of the COVID-19 declaration under Section 564(b)(1) of the Act, 21 U.S.C. section 360bbb-3(b)(1), unless the authorization is terminated or revoked.  Performed at [facility], [place] [address] [place], [place] [address]      Labs: Basic Metabolic Panel: Recent Labs  Lab 08/02/22 2007 08/02/22 2043 08/02/22 2216 08/03/22 0449 08/05/22 0711 08/06/22 0849 08/07/22 0043  NA 138 136  --  137 141 137 139  K 4.4 4.3  --  3.9 4.5 4.4 4.4  CL 103  --   --  105 110 108 109  CO2 18*  --   --  21* '24 22 24  '$ GLUCOSE 143*  --   --  199* 156* 185* 157*  BUN 24*  --   --  '20 22 21 22  '$ CREATININE 1.43*  --   --  1.09 0.84 0.98 0.97  CALCIUM 8.6*  --   --  7.9* 8.4* 8.3* 8.4*  MG  --   --  2.1 2.4 2.3  --  2.3  PHOS  --   --   --  3.0 3.1  --  3.0   Liver Function Tests: Recent Labs  Lab 08/03/22 0449 08/05/22 0711 08/06/22 0849 08/07/22 0043  AST  30 24 48* 52*  ALT 46* 40 73* 98*  ALKPHOS 50 44 46 45  BILITOT 0.6 0.8 1.2 0.7  PROT 5.9* 5.2* 5.2* 5.4*  ALBUMIN 2.8* 2.4* 2.5* 2.6*   No results for input(s): "LIPASE", "AMYLASE" in the last 168 hours. No results for input(s): "AMMONIA" in the last 168 hours. CBC: Recent Labs  Lab 08/02/22 2007 08/02/22 2043 08/03/22 0449 08/05/22 0711 08/06/22 0849 08/07/22 0043  WBC 19.1*  --  9.3 24.6* 19.6* 15.8*  NEUTROABS  --   --  8.3* 22.2* 17.3* 14.5*  HGB 14.2 15.6 12.6* 11.6* 12.2* 11.8*  HCT 46.3 46.0 40.5 36.0* 37.3* 37.2*  MCV 88.5  --  85.8 84.1 83.6 85.5  PLT 243  --  204 213 221 200   Cardiac Enzymes: No results for input(s): "CKTOTAL", "CKMB", "CKMBINDEX", "TROPONINI" in the last 168 hours. BNP: BNP (last 3 results) Recent Labs    08/03/22 0449  BNP 192.0*    ProBNP (last 3 results) No results for input(s): "PROBNP" in the last 8760 hours.  CBG: No results for input(s): "GLUCAP" in the last 168 hours.     Signed:  Dia Crawford, MD [facility]

## 2022-08-07 NOTE — TOC Transition Note (Signed)
Transition of Care [facility]) - CM/SW Discharge Note   Patient Details  Name: Frank Meyer MRN: [id number] Date of Birth: 09-Sep-1940  [facility]) CM/SW Contact:  Zenon Mayo, RN Phone Number: 08/07/2022, 10:09 AM   Clinical Narrative:    Patient is for dc today, [employer] will bring neb machine up to patient's room prior to dc. Wife will be transporting him home. NCM notified Kelly with [facility] of dc today.    Final next level of care: Home w Home Health Services Barriers to Discharge: Continued Medical Work up   Patient Goals and CMS Choice CMS Medicare.gov Compare Post Acute Care list provided to:: Patient Choice offered to / list presented to : Patient  Discharge Placement                         Discharge Plan and Services Additional resources added to the After Visit Summary for   In-house Referral: NA Discharge Planning Services: CM Consult Post Acute Care Choice: Home Health          DME Arranged: N/A DME Agency: NA       HH Arranged: PT HH Agency: [place] Date [place]: 08/04/22 Time [place]: 3532 Representative spoke with at [place]: [place] Determinants of Health ([place]) Interventions [place]: No Food Insecurity (08/03/2022)  Housing: Low Risk  (08/03/2022)  Transportation Needs: No Transportation Needs (08/03/2022)  Utilities: Not At Risk (08/03/2022)  Tobacco Use: Medium Risk (08/03/2022)     Readmission Risk Interventions     No data to display

## 2022-08-20 ENCOUNTER — Encounter: Payer: Self-pay | Admitting: Cardiology

## 2022-08-20 ENCOUNTER — Ambulatory Visit: Payer: Medicare Other | Attending: Cardiology | Admitting: Cardiology

## 2022-08-20 VITALS — BP 126/74 | HR 76 | Ht 66.5 in | Wt 162.1 lb

## 2022-08-20 DIAGNOSIS — I272 Pulmonary hypertension, unspecified: Secondary | ICD-10-CM

## 2022-08-20 DIAGNOSIS — Z79899 Other long term (current) drug therapy: Secondary | ICD-10-CM

## 2022-08-20 DIAGNOSIS — I5032 Chronic diastolic (congestive) heart failure: Secondary | ICD-10-CM | POA: Diagnosis not present

## 2022-08-20 DIAGNOSIS — J449 Chronic obstructive pulmonary disease, unspecified: Secondary | ICD-10-CM | POA: Diagnosis not present

## 2022-08-20 DIAGNOSIS — E785 Hyperlipidemia, unspecified: Secondary | ICD-10-CM

## 2022-08-20 MED ORDER — PREDNISONE 10 MG PO TABS: 10.0000 mg | ORAL_TABLET | Freq: Every day | ORAL | 0 refills | Status: DC

## 2022-08-20 NOTE — Progress Notes (Signed)
Cardiology Office Note:    Date:  08/20/2022   ID:  Frank Meyer, DOB September 25, 1940, MRN [id number]  PCP:  Burnard Bunting, MD  Cardiologist:  Jenne Campus, MD    Referring MD: Burnard Bunting, MD   Chief Complaint  Patient presents with   Follow-up  Post hospital follow-up  History of Present Illness:    Frank Meyer is a 82 y.o. 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  In 2021 he did have evaluation for coronary artery disease.  There were some lesions borderline however fractional flow reserve was negative.  Recently he ended up being in the hospital because of respiratory failure.  Pneumonia has been recognized treated appropriately he is oxygen dependent doing quite fair considering he is trying to join pulmonary rehab which I think is excellent idea  Past Medical History:  Diagnosis Date   Allergic rhinitis 10/24/2009   Altered bowel habits 10/04/2020   Basal cell carcinoma    Benign prostatic hyperplasia with lower urinary tract symptoms 10/24/2009   Benign prostatic hypertrophy    Bradycardia 12/06/2019   Callosity 10/16/2013   Cardiomegaly 01/23/2015   Chronic diastolic heart failure ([place]) 12/19/2018   Chronic respiratory failure ([place]) 10/04/2020   Coronary artery calcification seen on CAT scan 05/28/2020   Dyslipidemia    Dyspnea on exertion 03/18/2018   Emphysema lung ([place]) 03/18/2018   Encounter for general adult medical examination without abnormal findings 01/17/2015   Ex-cigarette smoker  01/20/2021   Exudative age-related macular degeneration of left eye with active choroidal neovascularization ([place]) 12/18/2019   Gallbladder problem    Gastroesophageal reflux disease    Hiatal hernia    Hilar adenopathy 03/25/2018   History of smoking 03/18/2018   Hyperlipidemia 10/24/2009   Insomnia 05/09/2013   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   Internal hemorrhoid 10/04/2020   Macular degeneration    Macular pucker, right eye 04/30/2020   Mediastinal adenopathy 03/25/2018   Memory loss 05/09/2013   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   Mild memory disturbance    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   Noninfective gastroenteritis and colitis, unspecified 10/04/2020   Nuclear sclerotic cataract of right eye 12/18/2019   Other specified symptoms and signs involving the digestive system and abdomen 10/04/2020   Overweight 09/14/2018   Oxygen dependent 10/04/2020   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   Personal history of colonic polyps 10/04/2020   Posterior vitreous detachment of right eye 12/18/2019   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   ANORO ELLIPTA 62.5-25 MCG/ACT AEPB Inhale 1 puff into the lungs daily.  aspirin 81 MG EC tablet Take 81 mg by mouth daily.   atorvastatin (LIPITOR) 20 MG tablet Take 20 mg by mouth every evening.   cholecalciferol (VITAMIN D) 1000 UNITS tablet Take 1,000 Units by mouth daily.    dextromethorphan-guaiFENesin (MUCINEX DM) 30-600 MG 12hr tablet Take 1 tablet by mouth 2 (two) times daily.   donepezil (ARICEPT) 10 MG tablet Take 1 tablet (10 mg total) by mouth daily. (Patient taking differently: Take 10 mg by mouth at bedtime.)   fexofenadine (ALLEGRA) 180 MG tablet Take 180 mg by mouth daily.   finasteride (PROSCAR) 5 MG tablet Take 5 mg by mouth at bedtime.   ipratropium (ATROVENT) 0.06 % nasal spray Place 1-2 sprays into both nostrils See admin instructions. 2 sprays in the right nostril and 1 spray in the left nostril once daily   ipratropium-albuterol (DUONEB) 0.5-2.5 (3) MG/3ML SOLN Inhale 3 mLs by nebulization every 6 (six) hours as needed. (Patient taking differently: Take 3 mLs by nebulization every 6 (six) hours as needed (sob or wheezing).)   levETIRAcetam (KEPPRA) 250 MG tablet Take 1-2 tablets (250-500 mg total) by mouth See admin instructions. Take 250 mg by mouth in the morning and 500 mg by mouth at night. (Patient taking differently: Take 250-500 mg by mouth See admin instructions. Take one tablet (250 mg) by mouth in the morning and two tablets (500 mg) by mouth at night.)   Melatonin 10 MG TABS Take 20 mg by mouth at bedtime.   nitroGLYCERIN (NITROSTAT) 0.4 MG SL tablet Place 1 tablet (0.4 mg total) under the tongue every 5 (five) minutes as needed. Chest pain (Patient taking differently: Place 0.4 mg under the tongue every 5 (five) minutes as needed for chest pain. Chest pain)   omeprazole (PRILOSEC) 40 MG capsule Take 40 mg by mouth daily.    OXYGEN Inhale 2-5 L into the lungs as directed. 5lt during the day and 2lt at night   predniSONE (DELTASONE) 10 MG tablet Take 4 tablets (40 mg total) daily with breakfast for 7 days, THEN 2 tablets (20 mg total) daily with breakfast for 7 days, THEN 1 tablet (10 mg total) daily with breakfast for 7 days, THEN 0.5 tablets (5 mg total) daily with breakfast for 7 days.   sacubitril-valsartan (ENTRESTO) 24-26 MG Take 1 tablet  by mouth daily. (Patient taking differently: Take 1 tablet by mouth every evening. At 3pm)   thiamine 100 MG tablet Take 1 tablet (100 mg total) by mouth daily.   traZODone (DESYREL) 100 MG tablet Take 2 tablets (200 mg total) by mouth at bedtime.     Allergies:   Patient has no known allergies.   Social History   Socioeconomic History   Marital status: Married    Spouse name: Frank Meyer   Number of children: 2   Years of education: college   Highest education level: Not on file  Occupational History   Occupation: retired  Tobacco Use   Smoking status: Former    Packs/day: 2.00    Years: 25.00    Total pack years: 50.00    Types: Cigarettes    Quit date: 1994    Years since quitting: 30.0   Smokeless tobacco: Never  Substance and Sexual Activity   Alcohol use: Yes    Alcohol/week: 15.0 - 20.0 standard drinks of alcohol    Types: 15 - 20 Cans of beer per week   Drug use: Never   Sexual activity: Not on file  Other Topics Concern  Not on file  Social History Narrative   Pt is retired. He is married. The pt lives with his wife. He has a Best boy. The pt has 2 children.      Patient drinks 2-3 cups of caffeine daily.   Patient is right handed.    Social Determinants of Health   Financial Resource Strain: Not on file  Food Insecurity: No Food Insecurity (08/03/2022)   Hunger Vital Sign    Worried About Running Out of Food in the Last Year: Never true    Ran Out of Food in the Last Year: Never true  Transportation Needs: No Transportation Needs (08/03/2022)   PRAPARE - Hydrologist (Medical): No    Lack of Transportation (Non-Medical): No  Physical Activity: Not on file  Stress: Not on file  Social Connections: Not on file     Family History: The patient's family history includes Memory loss in his mother; Obesity in his mother. ROS:   Please see the history of present illness.    All 14 point review of systems negative except as  described per history of present illness  EKGs/Labs/Other Studies Reviewed:      Recent Labs: 08/03/2022: B Natriuretic Peptide 192.0 08/07/2022: ALT 98; BUN 22; Creatinine, Ser 0.97; Hemoglobin 11.8; Magnesium 2.3; Platelets 200; Potassium 4.4; Sodium 139  Recent Lipid Panel    Component Value Date/Time   CHOL 147 06/13/2020 0819   TRIG 129 06/13/2020 0819   HDL 57 06/13/2020 0819   CHOLHDL 2.6 06/13/2020 0819   LDLCALC 67 06/13/2020 0819    Physical Exam:    VS:  BP 126/74 (BP Location: Left Arm, Patient Position: Sitting)   Pulse 76   Ht 5' 6.5" (1.689 m)   Wt 162 lb 1.9 oz (73.5 kg)   SpO2 93%   BMI 25.77 kg/m     Wt Readings from Last 3 Encounters:  08/20/22 162 lb 1.9 oz (73.5 kg)  08/07/22 160 lb 9.6 oz (72.8 kg)  07/30/22 169 lb (76.7 kg)     GEN:  Well nourished, well developed in no acute distress HEENT: Normal NECK: No JVD; No carotid bruits LYMPHATICS: No lymphadenopathy CARDIAC: RRR, no murmurs, no rubs, no gallops RESPIRATORY:  Clear to auscultation without rales, wheezing or rhonchi poor entry bilaterally ABDOMEN: Soft, non-tender, non-distended MUSCULOSKELETAL:  No edema; No deformity  SKIN: Warm and dry LOWER EXTREMITIES: no swelling NEUROLOGIC:  Alert and oriented x 3 PSYCHIATRIC:  Normal affect   ASSESSMENT:    1. Medication management   2. Chronic diastolic CHF (congestive heart failure) ([place])   3. Pulmonary hypertension ([place])   4. Chronic obstructive pulmonary disease, unspecified COPD type ([place])   5. Dyslipidemia    PLAN:    In order of problems listed above:  Pulmonary hypertension questionable diagnosis I did review multiple echocardiograms done within the last few years and I do not see any evidence of pulmonary hypertension.  He understood that he does have some lesion in the lungs there was some question of a biopsy however because of pulmonary hypertension he was told that this is too risky to do that if we really were facing the  situation that knowing if he got pulmonary hypertension is critical cardiac catheterization should be considered to clarify the diagnosis.  However, my understanding about reluctance of doing biopsy of his lesion in the lungs is the fact that he does have advanced COPD with oxygen dependence.  And honestly I do  not think he will be able to survive some surgical intervention.  Likely he does have follow-up appoint with his pulmonologist and decision will be made about which way to proceed. Chronic diastolic congestive heart failure appears to be compensated on my physical exam continue present management. COPD which is advanced followed by pulmonary. Dyslipidemia I did review his K PN which show me his LDL 76 HDL 43 it is from April 22, 2022.  He is on Lipitor 20 which I will continue he is also on antiplatelet therapy. Coronary disease nonobstructive from 2021 antiplatelet therapy and statin I did review record from hospital for this visit   Medication Adjustments/Labs and Tests Ordered: Current medicines are reviewed at length with the patient today.  Concerns regarding medicines are outlined above.  Orders Placed This Encounter  Procedures   EKG 12-Lead   Medication changes: No orders of the defined types were placed in this encounter.   Signed, Park Liter, MD, [facility] 08/20/2022 11:15 AM    [place]

## 2022-08-20 NOTE — Patient Instructions (Signed)

## 2022-08-23 ENCOUNTER — Other Ambulatory Visit: Payer: Self-pay | Admitting: Neurology

## 2022-08-25 ENCOUNTER — Encounter: Payer: Self-pay | Admitting: Pulmonary Disease

## 2022-08-25 ENCOUNTER — Ambulatory Visit: Payer: Medicare Other | Admitting: Pulmonary Disease

## 2022-08-25 VITALS — BP 128/68 | HR 88 | Wt 160.0 lb

## 2022-08-25 DIAGNOSIS — J431 Panlobular emphysema: Secondary | ICD-10-CM | POA: Diagnosis not present

## 2022-08-25 DIAGNOSIS — R911 Solitary pulmonary nodule: Secondary | ICD-10-CM | POA: Diagnosis not present

## 2022-08-25 LAB — BASIC METABOLIC PANEL
BUN: 18 mg/dL (ref 6–23)
CO2: 33 mEq/L — ABNORMAL HIGH (ref 19–32)
Calcium: 8.7 mg/dL (ref 8.4–10.5)
Chloride: 99 mEq/L (ref 96–112)
Creatinine, Ser: 1.18 mg/dL (ref 0.40–1.50)
GFR: 57.83 mL/min — ABNORMAL LOW (ref >60.00)
Glucose, Bld: 90 mg/dL (ref 70–99)
Potassium: 4.3 mEq/L (ref 3.5–5.1)
Sodium: 138 mEq/L (ref 135–145)

## 2022-08-25 MED ORDER — IPRATROPIUM-ALBUTEROL 0.5-2.5 (3) MG/3ML IN SOLN: 3.0000 mL | Freq: Two times a day (BID) | RESPIRATORY_TRACT | 3 refills | Status: DC

## 2022-08-25 MED ORDER — IPRATROPIUM-ALBUTEROL 0.5-2.5 (3) MG/3ML IN SOLN: 3.0000 mL | Freq: Two times a day (BID) | RESPIRATORY_TRACT | 0 refills | Status: DC

## 2022-08-25 NOTE — Patient Instructions (Addendum)
Nice to see you again  Continue the antibiotics and prednisone as prescribed  We should repeat a CT scan in early February, give you a few weeks to recover from the pneumonia to further evaluate the nodule seen on the CT scan in the hospital  I sent a referral to pulmonary rehab.  They can discuss details of how frequently and how long each session lasts and how long the entire program last.  In addition, asked him about where to park to minimize how far you  have to walk to get there.  I refilled the nebulizer solution, use twice a day.  I sent a 30-day supply to your local pharmacy and a 90-day supply with refills to be order pharmacy.  Return to clinic in 3 months or sooner as needed with Dr. Silas Flood

## 2022-08-25 NOTE — Progress Notes (Signed)
$'[name]$  ID: Frank Meyer, male    DOB: [birth year], 82 y.o.   MRN: [id number]  Chief Complaint  Patient presents with   Follow-up    Pt is here for follow up for chronic resp failure. Pt is still on 5L via POC. Pt states since last office visit he has had two trips to the hospital. Pt is on Anoro daily and Duoneb treatments.     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  Most recent pulmonary consultation note reviewed.  Discharge summary reviewed x 2.  Patient mid to the hospital 07/21/2022.  He was treated for what sounds like aspiration pneumonitis.  Given prednisone.  Not discharged on antibiotics it seems.  Only in the hospital for around 24 hours.  He represented to the hospital and was admitted 08/02/2022 with worsening cough and shortness of breath.  CT scan on my review interpretation reveals bilateral airspace disease, nodular opacities right upper lobe and lingula concerning for pneumonia.  He was treated with prolonged course of antibiotics and steroids.  He continues oral taper of steroids as well as oral antibiotics.  He is back home.  He was discharged 07/2021.  He slowly increasing his walking.  Walking a half a mile at least once or twice a day.  Sometimes up to a mile.  When he uses his rescue inhaler or nebulizer solution ahead of time he can walk a little bit faster.  Still feels like he did not have stamina back.  Cough seems little better.  Still slightly short of breath.  We discussed referral to pulmonary rehab.  Discussed follow-up imaging for CT scan given nodules.  HPI at initial visit:  Patient longstanding history of shortness of breath.  He is need oxygen for some years.  Uses up to 5 L with activity, 3-4 at rest.  His shortness of breath is described as severe.  Worse  with exertion.  Somewhat relieved by rest over time.  No positional changes.  Does not seem to change with seasons, environment.  No other alleviating or exacerbating factors.  No history or family history I should say of pulmonary hypertension.  He denies any connective tissue disease.  No arthralgias, arthritis.  Denies Raynaud's phenomenon.  Never taken diet supplements.  Does not use methamphetamine.  No congenital heart disease per his report.  Extensive review of medical record performed today.  Recent high-res CT chest with severe centrilobular and paraseptal emphysema on my review.  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  No evidence of ILD on my review.  TTE performed 11/2019.  This demonstrated mildly dilated LA, diastolic dysfunction, mild aortic regurgitation, mildly dilated RA, mildly enlarged RV with normal RV function, estimated elevated PASP.  Most recent PFTs 06/2020 demonstrated severely reduced DLCO 44% predicted with a surprisingly PFTs, TLC, no evidence of hyperinflation.  PMH: COPD, enlarged prostate, hyperlipidemia Surgical history: Reviewed in detail, he denies significant surgical history Family history: Memory loss in mother, denies significant pulmonary or respiratory issues and family Social history: Former smoker, 50-pack-year history, quit late 90s, retired   Licensed conveyancer / Pulmonary Flowsheets:   ACT:      No data to display           MMRC: mMRC Dyspnea Scale mMRC Score  09/26/2020 10:47 AM 0  Epworth:      No data to display           Tests:   FENO:  No results found for: "NITRICOXIDE"  PFT:    Latest Ref Rng & Units 06/26/2020    3:50 PM 04/27/2018    8:48 AM  PFT Results  FVC-Pre L 3.75  4.11   FVC-Predicted Pre % 111  119   FVC-Post L 3.85  4.07   FVC-Predicted Post % 113  118   Pre FEV1/FVC % % 78  79   Post FEV1/FCV % % 78  78   FEV1-Pre L 2.92  3.23   FEV1-Predicted Pre %  122  131   FEV1-Post L 3.02  3.20   DLCO uncorrected ml/min/mmHg 9.44  11.81   DLCO UNC% % 44  44   DLCO corrected ml/min/mmHg 9.61  11.92   DLCO COR %Predicted % 44  44   DLVA Predicted % 45  49   TLC L 5.75  5.24   TLC % Predicted % 92  84   RV % Predicted % 87  51   Personally reviewed and interpreted as normal spirometry, no bronchodilator spots, normal lung volumes, DLCO severely reduced  WALK:      No data to display           Imaging: Personally reviewed and as per EMR discussion of this note  Lab Results: Personally reviewed, notably eosinophils as high as 400 06/2020 CBC    Component Value Date/Time   WBC 15.8 (H) 08/07/2022 0043   RBC 4.35 08/07/2022 0043   HGB 11.8 (L) 08/07/2022 0043   HCT 37.2 (L) 08/07/2022 0043   PLT 200 08/07/2022 0043   MCV 85.5 08/07/2022 0043   MCH 27.1 08/07/2022 0043   MCHC 31.7 08/07/2022 0043   RDW 15.0 08/07/2022 0043   LYMPHSABS 0.6 (L) 08/07/2022 0043   MONOABS 0.5 08/07/2022 0043   EOSABS 0.0 08/07/2022 0043   BASOSABS 0.0 08/07/2022 0043    BMET    Component Value Date/Time   NA 139 08/07/2022 0043   NA 139 10/28/2020 0930   K 4.4 08/07/2022 0043   CL 109 08/07/2022 0043   CO2 24 08/07/2022 0043   GLUCOSE 157 (H) 08/07/2022 0043   BUN 22 08/07/2022 0043   BUN 15 10/28/2020 0930   CREATININE 0.97 08/07/2022 0043   CALCIUM 8.4 (L) 08/07/2022 0043   GFRNONAA >60 08/07/2022 0043   GFRAA 64 06/13/2020 0819    BNP    Component Value Date/Time   BNP 192.0 (H) 08/03/2022 0449    ProBNP    Component Value Date/Time   PROBNP 223 10/15/2020 1632    Specialty Problems       Pulmonary Problems   Allergic rhinitis   Dyspnea on exertion   Panlobular emphysema (HCC)   Right upper lobe pulmonary nodule   Hiatal hernia   Chronic respiratory failure with hypoxia (HCC)   Oxygen dependent   COPD (chronic obstructive pulmonary disease) (HCC)   Aspiration pneumonitis (HCC)   Acute exacerbation of chronic  obstructive pulmonary disease (COPD) ([place])   CAP (community acquired pneumonia)    No Known Allergies  Immunization History  Administered Date(s) Administered   DTaP, 5 pertussis antigens 02/17/2016   Fluad Quad(high Dose 65+) 04/29/2019, 04/20/2020   Influenza Split 05/10/2012, 05/20/2013, 08/17/2013   Influenza, High Dose Seasonal PF 05/24/2017, 06/03/2018   Influenza-Unspecified 06/04/2018, 04/27/2019   PFIZER(Purple Top)SARS-COV-2 Vaccination 09/25/2019, 10/20/2019, 04/03/2020, 05/14/2020  Pneumococcal Conjugate-13 07/20/2014    Past Medical History:  Diagnosis Date   Allergic rhinitis 10/24/2009   Altered bowel habits 10/04/2020   Basal cell carcinoma    Benign prostatic hyperplasia with lower urinary tract symptoms 10/24/2009   Benign prostatic hypertrophy    Bradycardia 12/06/2019   Callosity 10/16/2013   Cardiomegaly 01/23/2015   Chronic diastolic heart failure ([place]) 12/19/2018   Chronic respiratory failure ([place]) 10/04/2020   Coronary artery calcification seen on CAT scan 05/28/2020   Dyslipidemia    Dyspnea on exertion 03/18/2018   Emphysema lung ([place]) 03/18/2018   Encounter for general adult medical examination without abnormal findings 01/17/2015   Ex-cigarette smoker 01/20/2021   Exudative age-related macular degeneration of left eye with active choroidal neovascularization ([place]) 12/18/2019   Gallbladder problem    Gastroesophageal reflux disease    Hiatal hernia    Hilar adenopathy 03/25/2018   History of smoking 03/18/2018   Hyperlipidemia 10/24/2009   Insomnia 05/09/2013   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   Internal hemorrhoid 10/04/2020   Macular degeneration    Macular pucker, right eye 04/30/2020   Mediastinal adenopathy 03/25/2018   Memory loss 05/09/2013   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   Mild memory disturbance    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   Noninfective  gastroenteritis and colitis, unspecified 10/04/2020   Nuclear sclerotic cataract of right eye 12/18/2019   Other specified symptoms and signs involving the digestive system and abdomen 10/04/2020   Overweight 09/14/2018   Oxygen dependent 10/04/2020   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   Personal history of colonic polyps 10/04/2020   Posterior vitreous detachment of right eye 12/18/2019   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   Pulmonary hypertension ([place])    Right upper lobe pulmonary nodule 03/25/2018   Syncope and collapse 12/12/2018   TIA (transient ischemic attack)    Transient ischemic attack 10/24/2009   Weight decreased 10/04/2020    Tobacco History: Social History   Tobacco Use  Smoking Status Former   Packs/day: 2.00   Years: 25.00   Total pack years: 50.00   Types: Cigarettes   Quit date: 1994   Years since quitting: 30.0  Smokeless Tobacco Never   Counseling given: Not Answered   Continue to not smoke  Outpatient Encounter Medications as of 08/25/2022  Medication Sig   amoxicillin-clavulanate (AUGMENTIN) 875-125 MG tablet Take 1 tablet by mouth every 12 (twelve) hours.   ANORO ELLIPTA 62.5-25 MCG/ACT AEPB Inhale 1 puff into the lungs daily.   aspirin 81 MG EC tablet Take 81 mg by mouth daily.   atorvastatin (LIPITOR) 20 MG tablet Take 20 mg by mouth every evening.   cholecalciferol (VITAMIN D) 1000 UNITS tablet Take 1,000 Units by mouth daily.   dextromethorphan-guaiFENesin (MUCINEX DM) 30-600 MG 12hr tablet Take 1 tablet by mouth 2 (two) times daily.   donepezil (ARICEPT) 10 MG tablet Take 1 tablet (10 mg total) by mouth daily. (Patient taking differently: Take 10 mg by mouth at bedtime.)   fexofenadine (ALLEGRA) 180 MG tablet Take 180 mg by mouth daily.   finasteride (PROSCAR) 5 MG tablet Take 5 mg by mouth at bedtime.   ipratropium (ATROVENT) 0.06 % nasal spray Place 1-2 sprays into both nostrils See admin instructions. 2  sprays in the right nostril and 1 spray in the left nostril once daily   levETIRAcetam (KEPPRA) 250 MG tablet Take 1-2 tablets (250-500 mg total) by mouth See admin instructions. Take  250 mg by mouth in the morning and 500 mg by mouth at night. (Patient taking differently: Take 250-500 mg by mouth See admin instructions. Take one tablet (250 mg) by mouth in the morning and two tablets (500 mg) by mouth at night.)   Melatonin 10 MG TABS Take 20 mg by mouth at bedtime.   omeprazole (PRILOSEC) 40 MG capsule Take 40 mg by mouth daily.    OXYGEN Inhale 2-5 L into the lungs as directed. 5lt during the day and 2lt at night   predniSONE (DELTASONE) 10 MG tablet Take 1 tablet (10 mg total) by mouth daily with breakfast. Take as directed to complete taper   sacubitril-valsartan (ENTRESTO) 24-26 MG Take 1 tablet by mouth daily. (Patient taking differently: Take 1 tablet by mouth every evening. At 3pm)   thiamine 100 MG tablet Take 1 tablet (100 mg total) by mouth daily.   traZODone (DESYREL) 100 MG tablet TAKE 2 TABLETS BY MOUTH AT  BEDTIME   [DISCONTINUED] ipratropium-albuterol (DUONEB) 0.5-2.5 (3) MG/3ML SOLN Inhale 3 mLs by nebulization every 6 (six) hours as needed. (Patient taking differently: Take 3 mLs by nebulization every 6 (six) hours as needed (sob or wheezing).)   ipratropium-albuterol (DUONEB) 0.5-2.5 (3) MG/3ML SOLN Take 3 mLs by nebulization 2 (two) times daily.   nitroGLYCERIN (NITROSTAT) 0.4 MG SL tablet Place 1 tablet (0.4 mg total) under the tongue every 5 (five) minutes as needed. Chest pain (Patient taking differently: Place 0.4 mg under the tongue every 5 (five) minutes as needed for chest pain. Chest pain)   [DISCONTINUED] ipratropium-albuterol (DUONEB) 0.5-2.5 (3) MG/3ML SOLN Take 3 mLs by nebulization 2 (two) times daily.   No facility-administered encounter medications on file as of 08/25/2022.     Review of Systems  Review of Systems  n/a Physical Exam  BP 128/68 (BP Location:  Left Arm, Patient Position: Sitting, Cuff Size: Normal)   Pulse 88   Wt 160 lb (72.6 kg)   SpO2 96%   BMI 25.44 kg/m   Wt Readings from Last 5 Encounters:  08/25/22 160 lb (72.6 kg)  08/20/22 162 lb 1.9 oz (73.5 kg)  08/07/22 160 lb 9.6 oz (72.8 kg)  07/30/22 169 lb (76.7 kg)  07/21/22 160 lb (72.6 kg)    BMI Readings from Last 5 Encounters:  08/25/22 25.44 kg/m  08/20/22 25.77 kg/m  08/07/22 25.92 kg/m  07/30/22 27.28 kg/m  07/21/22 25.82 kg/m     Physical Exam General: Well-appearing, sitting in chair Eyes: EOMI, no icterus Neck: Supple, no JVP appreciated Respiratory: Diminished throughout, normal work of breathing, no wheeze Cardiovascular: Regular rhythm, no murmur MSK: Tender to palpation of her right rib cage    Assessment & Plan:   Dyspnea exertion: Likely multifactorial.  Related to emphysema, suspected dynamic gas trapping although none demonstrated on the recent pulmonary function test.  Worsening the summer 2023 in the setting of decreased activity after starting seizure medication and inability to drive, play golf etc.  Suspect largest contributor to worsening is deconditioning and worsening lung function with pneumonia and COPD exacerbations.  Encouraged him to try to increase activity at home.  Continue low-dose Trelegy.  Referral to pulmonary rehab.  Lung nodule: Bilateral, right upper lobe no suspicious.  Repeat CT scan in a few weeks after pneumonia to see about improvement with antibiotics.  If no improvement, malignancy highly suspected.  His performance status is poor, high oxygen requirement, poor candidate for therapies.  Could consider radiation empirically versus biopsy.  High  risk biopsy was significant emphysema.  As well as significant hypoxemia.  Pulmonary hypertension, presumed: Based on TTE  11/2019.  No right heart cath in the past.  Suspect multifactorial.  Likely component of Group 2 disease given dilated LA, aortic valve insufficiency,  diastolic dysfunction.  Likely largest component of group 3 disease given significant emphysema seen on CT as well as chronic hypoxemic respiratory failure in the setting of emphysema. VQ scan negative. No OSA, nocturnal hypoxemia present. TTE 09/2020 with improved now normal PASP but new development mildly reduced RV function. New mild reduced LV function as well. Repeat TTE 12/2020 with improved RV and LV function in setting of oxygen therapy and entresto per cardiology.    No further therapy at this time.   Emphysema: significant on CT scan. Spirometry WNL, no gas trapping, DLCO reduced. Albuterol not helpful.  Trial of Trelegy, if not beneficial resume Anoro.  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  Repeat CT scan as above. Chronic hypoxemic respiratory failure: Continue POC device 5 to 6 L with exertion.  Return in about 3 months (around 11/24/2022).   Lanier Clam, MD 08/25/2022  I spent 45 minutes in care of patient including review of records, face-to-face visit, coordination of care.

## 2022-09-01 NOTE — Progress Notes (Signed)
[facility] Note  09/07/2022     CHIEF COMPLAINT Patient presents for Retina Follow Up   HISTORY OF PRESENT ILLNESS: Frank Meyer is a 82 y.o. 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  Severity is moderate.  Duration of 19 weeks.  Since onset it is stable.  I, the attending physician,  performed the HPI with the patient and updated documentation appropriately.        Comments   Pt here for 19 wk ret f/u-Dr. Zadie Rhine pt. Pt states VA is doing well, no changes. Having a little more difficulty reading.       Last edited by Bernarda Caffey, MD on 09/09/2022 12:18 AM.      Referring physician: Burnard Bunting, MD [address] [place],  [place] [address]  HISTORICAL INFORMATION:   Selected notes from the [name] pt, transferring care due to insurance LEE: 09.12.23 -- IVA OU Ocular Hx- PMH-    CURRENT MEDICATIONS: No current outpatient medications on file. (Ophthalmic Drugs)   No current facility-administered medications for this visit. (Ophthalmic Drugs)   Current Outpatient Medications (Other)  Medication Sig   ANORO ELLIPTA 62.5-25 MCG/ACT AEPB Inhale 1 puff into the lungs daily.   aspirin 81 MG EC tablet Take 81 mg by mouth daily.   atorvastatin (LIPITOR) 20 MG tablet Take 20 mg by mouth every evening.   cholecalciferol (VITAMIN D) 1000 UNITS tablet Take 1,000 Units by mouth daily.   dextromethorphan-guaiFENesin (MUCINEX DM) 30-600 MG 12hr tablet Take 1 tablet by mouth 2 (two) times daily.   donepezil (ARICEPT) 10 MG tablet Take 1 tablet (10 mg total) by mouth daily. (Patient taking differently: Take 10 mg by mouth at bedtime.)   fexofenadine (ALLEGRA) 180 MG tablet Take 180 mg by mouth daily.   finasteride (PROSCAR) 5 MG tablet Take 5 mg by mouth at bedtime.   ipratropium (ATROVENT) 0.06 % nasal spray Place 1-2 sprays into both  nostrils See admin instructions. 2 sprays in the right nostril and 1 spray in the left nostril once daily   ipratropium-albuterol (DUONEB) 0.5-2.5 (3) MG/3ML SOLN Take 3 mLs by nebulization 2 (two) times daily.   levETIRAcetam (KEPPRA) 250 MG tablet Take 1-2 tablets (250-500 mg total) by mouth See admin instructions. Take 250 mg by mouth in the morning and 500 mg by mouth at night. (Patient taking differently: Take 250-500 mg by mouth See admin instructions. Take one tablet (250 mg) by mouth in the morning and two tablets (500 mg) by mouth at night.)   Melatonin 10 MG TABS Take 20 mg by mouth at bedtime.   omeprazole (PRILOSEC) 40 MG capsule Take 40 mg by mouth daily.    OXYGEN Inhale 2-5 L into the lungs as directed. 5lt during the day and 2lt at night   predniSONE (DELTASONE) 10 MG tablet Take 1 tablet (10 mg total) by mouth daily with breakfast. Take as directed to complete taper   sacubitril-valsartan (ENTRESTO) 24-26 MG Take 1 tablet by mouth daily. (Patient taking differently: Take 1 tablet by mouth every evening. At 3pm)   thiamine 100 MG tablet Take 1 tablet (100 mg total) by mouth daily.   traZODone (DESYREL) 100 MG tablet TAKE 2 TABLETS BY MOUTH AT  BEDTIME   nitroGLYCERIN (NITROSTAT) 0.4 MG SL tablet Place 1 tablet (0.4 mg total)  under the tongue every 5 (five) minutes as needed. Chest pain (Patient taking differently: Place 0.4 mg under the tongue every 5 (five) minutes as needed for chest pain. Chest pain)   No current facility-administered medications for this visit. (Other)   REVIEW OF SYSTEMS: ROS   Positive for: Eyes Negative for: Constitutional, Gastrointestinal, Neurological, Skin, Genitourinary, Musculoskeletal, HENT, Endocrine, Cardiovascular, Respiratory, Psychiatric, Allergic/Imm, Heme/Lymph Last edited by Kingsley Spittle, COT on 09/07/2022  1:56 PM.     ALLERGIES No Known Allergies  PAST MEDICAL HISTORY Past Medical History:  Diagnosis Date   Allergic rhinitis  10/24/2009   Altered bowel habits 10/04/2020   Basal cell carcinoma    Benign prostatic hyperplasia with lower urinary tract symptoms 10/24/2009   Benign prostatic hypertrophy    Bradycardia 12/06/2019   Callosity 10/16/2013   Cardiomegaly 01/23/2015   Chronic diastolic heart failure ([place]) 12/19/2018   Chronic respiratory failure ([place]) 10/04/2020   Coronary artery calcification seen on CAT scan 05/28/2020   Dyslipidemia    Dyspnea on exertion 03/18/2018   Emphysema lung ([place]) 03/18/2018   Encounter for general adult medical examination without abnormal findings 01/17/2015   Ex-cigarette smoker 01/20/2021   Exudative age-related macular degeneration of left eye with active choroidal neovascularization ([place]) 12/18/2019   Gallbladder problem    Gastroesophageal reflux disease    Hiatal hernia    Hilar adenopathy 03/25/2018   History of smoking 03/18/2018   Hyperlipidemia 10/24/2009   Insomnia 05/09/2013   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   Internal hemorrhoid 10/04/2020   Macular degeneration    Macular pucker, right eye 04/30/2020   Mediastinal adenopathy 03/25/2018   Memory loss 05/09/2013   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   Mild memory disturbance    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   Noninfective gastroenteritis and colitis, unspecified 10/04/2020   Nuclear sclerotic cataract of right eye 12/18/2019   Other specified symptoms and signs involving the digestive system and abdomen 10/04/2020   Overweight 09/14/2018   Oxygen dependent 10/04/2020   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   Personal history of colonic polyps 10/04/2020   Posterior vitreous detachment of right eye 12/18/2019   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   Pulmonary hypertension ([place])    Right upper lobe pulmonary nodule 03/25/2018   Syncope and collapse 12/12/2018   TIA (transient ischemic attack)     Transient ischemic attack 10/24/2009   Weight decreased 10/04/2020   Past Surgical History:  Procedure Laterality Date   CHOLECYSTECTOMY     none     FAMILY HISTORY Family History  Problem Relation Age of Onset   Obesity Mother    Memory loss Mother    SOCIAL HISTORY Social History   Tobacco Use   Smoking status: Former    Packs/day: 2.00    Years: 25.00    Total pack years: 50.00    Types: Cigarettes    Quit date: 1994    Years since quitting: 30.0   Smokeless tobacco: Never  Substance Use Topics   Alcohol use: Yes    Alcohol/week: 15.0 - 20.0 standard drinks of alcohol    Types: 15 - 20 Cans of beer per week   Drug use: Never       OPHTHALMIC EXAM:  Base Eye Exam     Visual Acuity (Snellen - Linear)       Right Left   Dist [place] 20/40 +1 20/30   Dist ph [place] 20/25 -2 NI  Tonometry (Tonopen, 2:10 PM)       Right Left   Pressure 10 7         Pupils       Pupils Dark Light Shape React APD   Right PERRL 3 2 Round Brisk None   Left PERRL 3 2 Round Brisk None         Visual Fields (Counting fingers)       Left Right    Full Full         Extraocular Movement       Right Left    Full, Ortho Full, Ortho         Neuro/Psych     Oriented x3: Yes   Mood/Affect: Normal         Dilation     Both eyes: 1.0% Mydriacyl, 2.5% Phenylephrine @ 2:11 PM           Slit Lamp and Fundus Exam     External Exam       Right Left   External Normal Normal         Slit Lamp Exam       Right Left   Lids/Lashes Dermatochalasis - upper lid, Meibomian gland dysfunction Dermatochalasis - upper lid, Meibomian gland dysfunction   Conjunctiva/Sclera White and quiet White and quiet   Cornea arcus, 1-2+ Punctate epithelial erosions arcus, 1-2+ Punctate epithelial erosions, well healed cataract wound   Anterior Chamber deep and clear deep and clear   Iris Round and dilated Round and dilated   Lens 2-3+ Nuclear sclerosis with brunescence,  2-3+ Cortical cataract PC IOL in good position   Anterior Vitreous Vitreous syneresis Vitreous syneresis, silicone oil microbubbles         Fundus Exam       Right Left   Posterior Vitreous Posterior vitreous detachment Posterior vitreous detachment   Disc Pink and Sharp Pink and Sharp   C/D Ratio 0.2 0.2   Macula Flat, Blunted foveal reflex, Drusen, RPE mottling and clumping, mild Atrophy temporal mac Flat, Blunted foveal reflex, RPE mottling, low PED/CNV, no heme or fluid   Vessels attenuated, Tortuous attenuated, Tortuous   Periphery Attached, mild reticular degeneration, No heme Attached, mild reticular degeneration, No heme           Refraction     Manifest Refraction       Sphere Cylinder Axis Dist VA   Right       Left -1.00 +0.50 150 20/25           IMAGING AND PROCEDURES  Imaging and Procedures for 09/07/2022  OCT, Retina - OU - Both Eyes       Right Eye Quality was borderline. Central Foveal Thickness: 307. Progression has no prior data. Findings include normal foveal contour, no IRF, no SRF, retinal drusen , outer retinal atrophy (Focal ORA temporal macula, no fluid or edema).   Left Eye Quality was good. Central Foveal Thickness: 290. Progression has no prior data. Findings include normal foveal contour, no IRF, no SRF, retinal drusen , subretinal hyper-reflective material, pigment epithelial detachment (Low PED/SRHM centrally, trace cystic changes overlying ).   Notes *Images captured and stored on drive  Diagnosis / Impression:  OD: Focal ORA temporal macula, no fluid or edema OS: Low PED/SRHM centrally, trace cystic changes overlying   Clinical management:  See below  Abbreviations: NFP - Normal foveal profile. CME - cystoid macular edema. PED - pigment epithelial detachment. IRF - intraretinal fluid. SRF - subretinal  fluid. EZ - ellipsoid zone. ERM - epiretinal membrane. ORA - outer retinal atrophy. ORT - outer retinal tubulation. SRHM -  subretinal hyper-reflective material. IRHM - intraretinal hyper-reflective material      Intravitreal Injection, Pharmacologic Agent - OS - Left Eye       Time Out 09/07/2022. 2:41 PM. Confirmed correct patient, procedure, site, and patient consented.   Anesthesia Topical anesthesia was used. Anesthetic medications included Lidocaine 2%, Proparacaine 0.5%.   Procedure Preparation included 5% betadine to ocular surface, eyelid speculum. A (32g) needle was used.   Injection: 1.25 mg Bevacizumab 1.69m/0.05ml   Route: Intravitreal, Site: Left Eye   NDC:: 25852-778-24 Lot:: [id number] Expiration date: 10/16/2022   Post-op Post injection exam found visual acuity of at least counting fingers. The patient tolerated the procedure well. There were no complications. The patient received written and verbal post procedure care education.            ASSESSMENT/PLAN:    ICD-10-CM   1. 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 Combined forms of age-related cataract of right eye  H25.811     3. Pseudophakia  Z96.1      Exudative age related macular degeneration, both eyes  - Dr. RZadie Rhinept here due to insurance reasons  - history of multiple IVA OU on q6-8 scheduled (last IVA OU --11.15.23 - Dr. RZadie Rhine  - The incidence pathology and anatomy of wet AMD discussed   - The ANCHOR, MARINA, CATT and VIEW trials discussed with patient.    - discussed treatment options including observation vs intravitreal anti-VEGF agents such as Avastin, Lucentis, Eylea.    - Risks of endophthalmitis and vascular occlusive events and atrophic changes discussed with patient  - OCT shows OD: Focal ORA temporal macula, no fluid or edema; OS: Low PED/SRHM centrally, trace cystic changes overlying   - recommend  IVA OS #1 today, 01.22.24; recommend holding IVA OD  - pt wishes proceed with injection OS  - RBA of procedure discussed, questions answered - informed consent obtained and signed - see procedure note  - f/u in 6 wks, sooner prn -- DFE/OCT, possible injection(s)  2. Mixed Cataract OD - The symptoms of cataract, surgical options, and treatments and risks were discussed with patient. - discussed diagnosis and progression - monitor  3. Pseudophakia OS  - s/p CE/IOL OS  - IOL in good position, doing well  - monitor  Ophthalmic Meds Ordered this visit:  Meds ordered this encounter  Medications   Bevacizumab (AVASTIN) SOLN 1.25 mg     Return in about 6 weeks (around 10/19/2022) for 6-8 wks - exu ARMD OU, DFE, OCT, Possible Injxn.  There are no Patient Instructions on file for this visit.   Explained the diagnoses, plan, and follow up with the patient and they expressed understanding.  Patient expressed understanding of the importance of proper follow up care.   This document serves as a record of services personally performed by BGardiner Sleeper MD, PhD. It was created on their behalf by MOrvan Falconer an ophthalmic technician. The creation of this record is the provider's dictation and/or activities during the visit.    Electronically signed by: MOrvan Falconer OA, 09/09/22  12:19 AM  This document serves as a record of services personally performed  by Gardiner Sleeper, MD, PhD. It was created on their behalf by San Jetty. Owens Shark, OA an ophthalmic technician. The creation of this record is the provider's dictation and/or activities during the visit.    Electronically signed by: San Jetty. Owens Shark, [place] 01.22.2024 12:19 AM  Gardiner Sleeper, M.D., Ph.D. Diseases & Surgery of the Retina and Vitreous [facility]  I have reviewed the above documentation for accuracy and completeness, and I agree with the above. Gardiner Sleeper, M.D., Ph.D. 09/09/22 12:23  AM  Abbreviations: M myopia (nearsighted); A astigmatism; H hyperopia (farsighted); P presbyopia; Mrx spectacle prescription;  CTL contact lenses; OD right eye; OS left eye; OU both eyes  XT exotropia; ET esotropia; PEK punctate epithelial keratitis; PEE punctate epithelial erosions; DES dry eye syndrome; MGD meibomian gland dysfunction; ATs artificial tears; PFAT's preservative free artificial tears; [place] nuclear sclerotic cataract; PSC posterior subcapsular cataract; ERM epi-retinal membrane; PVD posterior vitreous detachment; RD retinal detachment; DM diabetes mellitus; DR diabetic retinopathy; NPDR non-proliferative diabetic retinopathy; PDR proliferative diabetic retinopathy; CSME clinically significant macular edema; DME diabetic macular edema; dbh dot blot hemorrhages; CWS cotton wool spot; POAG primary open angle glaucoma; C/D cup-to-disc ratio; HVF humphrey visual field; GVF goldmann visual field; OCT optical coherence tomography; IOP intraocular pressure; BRVO Branch retinal vein occlusion; CRVO central retinal vein occlusion; CRAO central retinal artery occlusion; BRAO branch retinal artery occlusion; RT retinal tear; SB scleral buckle; PPV pars plana vitrectomy; VH Vitreous hemorrhage; PRP panretinal laser photocoagulation; IVK intravitreal kenalog; VMT vitreomacular traction; MH Macular hole;  NVD neovascularization of the disc; NVE neovascularization elsewhere; AREDS age related eye disease study; ARMD age related macular degeneration; POAG primary open angle glaucoma; EBMD epithelial/anterior basement membrane dystrophy; ACIOL anterior chamber intraocular lens; IOL intraocular lens; PCIOL posterior chamber intraocular lens; Phaco/IOL phacoemulsification with intraocular lens placement; [place] photorefractive keratectomy; LASIK laser assisted in situ keratomileusis; HTN hypertension; DM diabetes mellitus; COPD chronic obstructive pulmonary disease

## 2022-09-07 ENCOUNTER — Ambulatory Visit (INDEPENDENT_AMBULATORY_CARE_PROVIDER_SITE_OTHER): Payer: Medicare Other | Admitting: Ophthalmology

## 2022-09-07 ENCOUNTER — Encounter (INDEPENDENT_AMBULATORY_CARE_PROVIDER_SITE_OTHER): Payer: Self-pay | Admitting: Ophthalmology

## 2022-09-07 DIAGNOSIS — H35371 Puckering of macula, right eye: Secondary | ICD-10-CM

## 2022-09-07 DIAGNOSIS — H353211 Exudative age-related macular degeneration, right eye, with active choroidal neovascularization: Secondary | ICD-10-CM

## 2022-09-07 DIAGNOSIS — H1132 Conjunctival hemorrhage, left eye: Secondary | ICD-10-CM

## 2022-09-07 DIAGNOSIS — H43811 Vitreous degeneration, right eye: Secondary | ICD-10-CM

## 2022-09-07 DIAGNOSIS — H25811 Combined forms of age-related cataract, right eye: Secondary | ICD-10-CM | POA: Diagnosis not present

## 2022-09-07 DIAGNOSIS — H353221 Exudative age-related macular degeneration, left eye, with active choroidal neovascularization: Secondary | ICD-10-CM

## 2022-09-07 DIAGNOSIS — H353231 Exudative age-related macular degeneration, bilateral, with active choroidal neovascularization: Secondary | ICD-10-CM | POA: Diagnosis not present

## 2022-09-07 DIAGNOSIS — Z961 Presence of intraocular lens: Secondary | ICD-10-CM | POA: Diagnosis not present

## 2022-09-07 DIAGNOSIS — H2511 Age-related nuclear cataract, right eye: Secondary | ICD-10-CM

## 2022-09-07 MED ORDER — BEVACIZUMAB CHEMO INJECTION 1.25MG/0.05ML SYRINGE FOR KALEIDOSCOPE
1.2500 mg | INTRAVITREAL | Status: AC | PRN
  Administered 2022-09-07: 1.25 mg via INTRAVITREAL

## 2022-09-10 ENCOUNTER — Telehealth: Payer: Self-pay | Admitting: Pulmonary Disease

## 2022-09-10 ENCOUNTER — Inpatient Hospital Stay (HOSPITAL_COMMUNITY)
Admission: EM | Admit: 2022-09-10 | Discharge: 2022-09-16 | DRG: 189 | Disposition: A | Discharge status: Home Health | Payer: Medicare Other | Attending: Internal Medicine | Admitting: Internal Medicine

## 2022-09-10 ENCOUNTER — Encounter (HOSPITAL_COMMUNITY): Payer: Self-pay | Admitting: Emergency Medicine

## 2022-09-10 ENCOUNTER — Other Ambulatory Visit: Payer: Self-pay

## 2022-09-10 ENCOUNTER — Emergency Department (HOSPITAL_COMMUNITY): Payer: Medicare Other

## 2022-09-10 ENCOUNTER — Inpatient Hospital Stay (HOSPITAL_COMMUNITY): Payer: Medicare Other

## 2022-09-10 DIAGNOSIS — I272 Pulmonary hypertension, unspecified: Secondary | ICD-10-CM | POA: Diagnosis present

## 2022-09-10 DIAGNOSIS — N189 Chronic kidney disease, unspecified: Secondary | ICD-10-CM | POA: Diagnosis present

## 2022-09-10 DIAGNOSIS — J431 Panlobular emphysema: Secondary | ICD-10-CM | POA: Diagnosis present

## 2022-09-10 DIAGNOSIS — R7989 Other specified abnormal findings of blood chemistry: Secondary | ICD-10-CM

## 2022-09-10 DIAGNOSIS — G4700 Insomnia, unspecified: Secondary | ICD-10-CM | POA: Diagnosis present

## 2022-09-10 DIAGNOSIS — E872 Acidosis, unspecified: Secondary | ICD-10-CM | POA: Diagnosis present

## 2022-09-10 DIAGNOSIS — D72829 Elevated white blood cell count, unspecified: Secondary | ICD-10-CM | POA: Diagnosis present

## 2022-09-10 DIAGNOSIS — E785 Hyperlipidemia, unspecified: Secondary | ICD-10-CM | POA: Diagnosis present

## 2022-09-10 DIAGNOSIS — Z87891 Personal history of nicotine dependence: Secondary | ICD-10-CM

## 2022-09-10 DIAGNOSIS — Z9981 Dependence on supplemental oxygen: Secondary | ICD-10-CM

## 2022-09-10 DIAGNOSIS — Z8673 Personal history of transient ischemic attack (TIA), and cerebral infarction without residual deficits: Secondary | ICD-10-CM

## 2022-09-10 DIAGNOSIS — J9621 Acute and chronic respiratory failure with hypoxia: Principal | ICD-10-CM

## 2022-09-10 DIAGNOSIS — Z87898 Personal history of other specified conditions: Secondary | ICD-10-CM

## 2022-09-10 DIAGNOSIS — I5032 Chronic diastolic (congestive) heart failure: Secondary | ICD-10-CM | POA: Diagnosis not present

## 2022-09-10 DIAGNOSIS — J441 Chronic obstructive pulmonary disease with (acute) exacerbation: Secondary | ICD-10-CM

## 2022-09-10 DIAGNOSIS — Z1152 Encounter for screening for COVID-19: Secondary | ICD-10-CM

## 2022-09-10 DIAGNOSIS — I959 Hypotension, unspecified: Secondary | ICD-10-CM | POA: Diagnosis present

## 2022-09-10 DIAGNOSIS — N401 Enlarged prostate with lower urinary tract symptoms: Secondary | ICD-10-CM | POA: Diagnosis present

## 2022-09-10 DIAGNOSIS — N179 Acute kidney failure, unspecified: Secondary | ICD-10-CM | POA: Diagnosis present

## 2022-09-10 DIAGNOSIS — Z85828 Personal history of other malignant neoplasm of skin: Secondary | ICD-10-CM

## 2022-09-10 DIAGNOSIS — Z79899 Other long term (current) drug therapy: Secondary | ICD-10-CM

## 2022-09-10 DIAGNOSIS — R413 Other amnesia: Secondary | ICD-10-CM | POA: Diagnosis present

## 2022-09-10 DIAGNOSIS — I5033 Acute on chronic diastolic (congestive) heart failure: Secondary | ICD-10-CM | POA: Diagnosis present

## 2022-09-10 DIAGNOSIS — J9601 Acute respiratory failure with hypoxia: Secondary | ICD-10-CM | POA: Diagnosis present

## 2022-09-10 DIAGNOSIS — I2510 Atherosclerotic heart disease of native coronary artery without angina pectoris: Secondary | ICD-10-CM | POA: Diagnosis present

## 2022-09-10 DIAGNOSIS — R54 Age-related physical debility: Secondary | ICD-10-CM | POA: Diagnosis present

## 2022-09-10 DIAGNOSIS — T380X5A Adverse effect of glucocorticoids and synthetic analogues, initial encounter: Secondary | ICD-10-CM | POA: Diagnosis present

## 2022-09-10 DIAGNOSIS — Z7951 Long term (current) use of inhaled steroids: Secondary | ICD-10-CM | POA: Diagnosis not present

## 2022-09-10 DIAGNOSIS — Z7982 Long term (current) use of aspirin: Secondary | ICD-10-CM

## 2022-09-10 LAB — COMPREHENSIVE METABOLIC PANEL
ALT: 20 U/L (ref 0–44)
AST: 23 U/L (ref 15–41)
Albumin: 2.8 g/dL — ABNORMAL LOW (ref 3.5–5.0)
Alkaline Phosphatase: 44 U/L (ref 38–126)
Anion gap: 13 (ref 5–15)
BUN: 19 mg/dL (ref 8–23)
CO2: 19 mmol/L — ABNORMAL LOW (ref 22–32)
Calcium: 8.4 mg/dL — ABNORMAL LOW (ref 8.9–10.3)
Chloride: 104 mmol/L (ref 98–111)
Creatinine, Ser: 1.65 mg/dL — ABNORMAL HIGH (ref 0.61–1.24)
GFR, Estimated: 41 mL/min — ABNORMAL LOW (ref >60)
Glucose, Bld: 137 mg/dL — ABNORMAL HIGH (ref 70–99)
Potassium: 4.1 mmol/L (ref 3.5–5.1)
Sodium: 136 mmol/L (ref 135–145)
Total Bilirubin: 1 mg/dL (ref 0.3–1.2)
Total Protein: 5.9 g/dL — ABNORMAL LOW (ref 6.5–8.1)

## 2022-09-10 LAB — CBC WITH DIFFERENTIAL/PLATELET
Abs Immature Granulocytes: 0.16 10*3/uL — ABNORMAL HIGH (ref 0.00–0.07)
Basophils Absolute: 0.1 10*3/uL (ref 0.0–0.1)
Basophils Relative: 1 %
Eosinophils Absolute: 0.3 10*3/uL (ref 0.0–0.5)
Eosinophils Relative: 2 %
HCT: 40.1 % (ref 39.0–52.0)
Hemoglobin: 12.4 g/dL — ABNORMAL LOW (ref 13.0–17.0)
Immature Granulocytes: 1 %
Lymphocytes Relative: 35 %
Lymphs Abs: 5 10*3/uL — ABNORMAL HIGH (ref 0.7–4.0)
MCH: 27.2 pg (ref 26.0–34.0)
MCHC: 30.9 g/dL (ref 30.0–36.0)
MCV: 87.9 fL (ref 80.0–100.0)
Monocytes Absolute: 1.4 10*3/uL — ABNORMAL HIGH (ref 0.1–1.0)
Monocytes Relative: 10 %
Neutro Abs: 7.3 10*3/uL (ref 1.7–7.7)
Neutrophils Relative %: 51 %
Platelets: 267 10*3/uL (ref 150–400)
RBC: 4.56 MIL/uL (ref 4.22–5.81)
RDW: 16.6 % — ABNORMAL HIGH (ref 11.5–15.5)
WBC: 14.4 10*3/uL — ABNORMAL HIGH (ref 4.0–10.5)
nRBC: 0 % (ref 0.0–0.2)

## 2022-09-10 LAB — D-DIMER, QUANTITATIVE: D-Dimer, Quant: 0.55 ug/mL-FEU — ABNORMAL HIGH (ref 0.00–0.50)

## 2022-09-10 LAB — TROPONIN I (HIGH SENSITIVITY)
Troponin I (High Sensitivity): 20 ng/L — ABNORMAL HIGH (ref <18)
Troponin I (High Sensitivity): 12 ng/L (ref <18)

## 2022-09-10 LAB — LACTIC ACID, PLASMA
Lactic Acid, Venous: 3.6 mmol/L (ref 0.5–1.9)
Lactic Acid, Venous: 1.5 mmol/L (ref 0.5–1.9)

## 2022-09-10 LAB — RESP PANEL BY RT-PCR (RSV, FLU A&B, COVID)  RVPGX2
Influenza A by PCR: NEGATIVE
Influenza B by PCR: NEGATIVE
Resp Syncytial Virus by PCR: NEGATIVE
SARS Coronavirus 2 by RT PCR: NEGATIVE

## 2022-09-10 LAB — BRAIN NATRIURETIC PEPTIDE: B Natriuretic Peptide: 254.8 pg/mL — ABNORMAL HIGH (ref 0.0–100.0)

## 2022-09-10 MED ORDER — SODIUM CHLORIDE 0.9 % IV BOLUS
1000.0000 mL | Freq: Once | INTRAVENOUS | Status: AC
  Administered 2022-09-10: 1000 mL via INTRAVENOUS

## 2022-09-10 MED ORDER — ALBUTEROL SULFATE (2.5 MG/3ML) 0.083% IN NEBU: 2.5000 mg | INHALATION_SOLUTION | RESPIRATORY_TRACT | Status: DC | PRN

## 2022-09-10 MED ORDER — LEVETIRACETAM 250 MG PO TABS
250.0000 mg | ORAL_TABLET | Freq: Every morning | ORAL | Status: DC
  Administered 2022-09-11 – 2022-09-16 (×6): 250 mg via ORAL
  Filled 2022-09-10 (×7): qty 1

## 2022-09-10 MED ORDER — SODIUM CHLORIDE 0.9 % IV SOLN: INTRAVENOUS | Status: AC

## 2022-09-10 MED ORDER — SODIUM CHLORIDE 0.9% FLUSH
3.0000 mL | Freq: Two times a day (BID) | INTRAVENOUS | Status: DC
  Administered 2022-09-10 – 2022-09-15 (×9): 3 mL via INTRAVENOUS

## 2022-09-10 MED ORDER — LEVETIRACETAM 250 MG PO TABS: 250.0000 mg | ORAL_TABLET | ORAL | Status: DC

## 2022-09-10 MED ORDER — PANTOPRAZOLE SODIUM 40 MG PO TBEC
40.0000 mg | DELAYED_RELEASE_TABLET | Freq: Every day | ORAL | Status: DC
  Administered 2022-09-11 – 2022-09-16 (×6): 40 mg via ORAL
  Filled 2022-09-10 (×6): qty 1

## 2022-09-10 MED ORDER — HEPARIN SODIUM (PORCINE) 5000 UNIT/ML IJ SOLN
5000.0000 [IU] | Freq: Three times a day (TID) | INTRAMUSCULAR | Status: DC
  Administered 2022-09-10 – 2022-09-16 (×15): 5000 [IU] via SUBCUTANEOUS
  Filled 2022-09-10 (×16): qty 1

## 2022-09-10 MED ORDER — ACETAMINOPHEN 650 MG RE SUPP: 650.0000 mg | Freq: Four times a day (QID) | RECTAL | Status: DC | PRN

## 2022-09-10 MED ORDER — ALBUTEROL SULFATE (2.5 MG/3ML) 0.083% IN NEBU
10.0000 mg | INHALATION_SOLUTION | Freq: Once | RESPIRATORY_TRACT | Status: AC
  Administered 2022-09-10: 10 mg via RESPIRATORY_TRACT
  Filled 2022-09-10: qty 12

## 2022-09-10 MED ORDER — SACUBITRIL-VALSARTAN 24-26 MG PO TABS
1.0000 | ORAL_TABLET | Freq: Every evening | ORAL | Status: DC
  Administered 2022-09-11 – 2022-09-15 (×5): 1 via ORAL
  Filled 2022-09-10 (×5): qty 1

## 2022-09-10 MED ORDER — TRAZODONE HCL 100 MG PO TABS
200.0000 mg | ORAL_TABLET | Freq: Every day | ORAL | Status: DC
  Administered 2022-09-10 – 2022-09-15 (×6): 200 mg via ORAL
  Filled 2022-09-10 (×2): qty 2
  Filled 2022-09-10: qty 4
  Filled 2022-09-10 (×3): qty 2

## 2022-09-10 MED ORDER — PREDNISONE 20 MG PO TABS
40.0000 mg | ORAL_TABLET | Freq: Every day | ORAL | Status: DC
  Administered 2022-09-12 – 2022-09-13 (×2): 40 mg via ORAL
  Filled 2022-09-10 (×2): qty 2

## 2022-09-10 MED ORDER — SODIUM CHLORIDE 0.9 % IV SOLN
1.0000 g | INTRAVENOUS | Status: AC
  Administered 2022-09-10 – 2022-09-14 (×5): 1 g via INTRAVENOUS
  Filled 2022-09-10 (×5): qty 10

## 2022-09-10 MED ORDER — LACTATED RINGERS IV BOLUS
1000.0000 mL | Freq: Once | INTRAVENOUS | Status: AC
  Administered 2022-09-10: 1000 mL via INTRAVENOUS

## 2022-09-10 MED ORDER — FINASTERIDE 5 MG PO TABS
5.0000 mg | ORAL_TABLET | Freq: Every day | ORAL | Status: DC
  Administered 2022-09-10 – 2022-09-15 (×6): 5 mg via ORAL
  Filled 2022-09-10 (×6): qty 1

## 2022-09-10 MED ORDER — SENNOSIDES-DOCUSATE SODIUM 8.6-50 MG PO TABS: 1.0000 | ORAL_TABLET | Freq: Every evening | ORAL | Status: DC | PRN

## 2022-09-10 MED ORDER — IPRATROPIUM-ALBUTEROL 0.5-2.5 (3) MG/3ML IN SOLN
3.0000 mL | Freq: Four times a day (QID) | RESPIRATORY_TRACT | Status: DC
  Administered 2022-09-10 – 2022-09-12 (×9): 3 mL via RESPIRATORY_TRACT
  Filled 2022-09-10 (×9): qty 3

## 2022-09-10 MED ORDER — ACETAMINOPHEN 325 MG PO TABS: 650.0000 mg | ORAL_TABLET | Freq: Four times a day (QID) | ORAL | Status: DC | PRN

## 2022-09-10 MED ORDER — IPRATROPIUM BROMIDE 0.02 % IN SOLN
0.5000 mg | Freq: Once | RESPIRATORY_TRACT | Status: AC
  Administered 2022-09-10: 0.5 mg via RESPIRATORY_TRACT
  Filled 2022-09-10: qty 2.5

## 2022-09-10 MED ORDER — LEVETIRACETAM 500 MG PO TABS
500.0000 mg | ORAL_TABLET | Freq: Every day | ORAL | Status: DC
  Administered 2022-09-10 – 2022-09-15 (×6): 500 mg via ORAL
  Filled 2022-09-10 (×6): qty 1

## 2022-09-10 MED ORDER — DONEPEZIL HCL 10 MG PO TABS
10.0000 mg | ORAL_TABLET | Freq: Every day | ORAL | Status: DC
  Administered 2022-09-10 – 2022-09-15 (×6): 10 mg via ORAL
  Filled 2022-09-10 (×6): qty 1

## 2022-09-10 MED ORDER — METHYLPREDNISOLONE SODIUM SUCC 125 MG IJ SOLR
125.0000 mg | Freq: Two times a day (BID) | INTRAMUSCULAR | Status: AC
  Administered 2022-09-11 (×2): 125 mg via INTRAVENOUS
  Filled 2022-09-10 (×2): qty 2

## 2022-09-10 MED ORDER — ATORVASTATIN CALCIUM 10 MG PO TABS
20.0000 mg | ORAL_TABLET | Freq: Every evening | ORAL | Status: DC
  Administered 2022-09-11 – 2022-09-15 (×5): 20 mg via ORAL
  Filled 2022-09-10 (×5): qty 2

## 2022-09-10 MED ORDER — MAGNESIUM SULFATE 2 GM/50ML IV SOLN
2.0000 g | Freq: Once | INTRAVENOUS | Status: AC
  Administered 2022-09-10: 2 g via INTRAVENOUS
  Filled 2022-09-10: qty 50

## 2022-09-10 MED ORDER — ASPIRIN 81 MG PO TBEC
81.0000 mg | DELAYED_RELEASE_TABLET | Freq: Every day | ORAL | Status: DC
  Administered 2022-09-11 – 2022-09-16 (×6): 81 mg via ORAL
  Filled 2022-09-10 (×6): qty 1

## 2022-09-10 MED ORDER — METHYLPREDNISOLONE SODIUM SUCC 125 MG IJ SOLR
125.0000 mg | Freq: Once | INTRAMUSCULAR | Status: AC
  Administered 2022-09-10: 125 mg via INTRAVENOUS
  Filled 2022-09-10: qty 2

## 2022-09-10 NOTE — ED Notes (Signed)
Patient's oxygen 72% on 6L [place]. Patient placed on NRB at [place]. Patient O2 increased to 99%

## 2022-09-10 NOTE — Progress Notes (Signed)
Pt removed from CAT and placed on 5L [place]. Tolerating well at this time, RN made aware and to call RT if any changes necessary.

## 2022-09-10 NOTE — H&P (Signed)
History and Physical    Frank Meyer VFI:[id number] DOB: Dec 17, 1940 DOA: 09/10/2022  PCP: Burnard Bunting, MD   Patient coming from: Home   Chief Complaint: Low oxygen saturation, fall   HPI: Frank Meyer is a 82 y.o. male with medical history significant for COPD with chronic hypoxic respiratory failure, chronic diastolic CHF, memory loss, and history of seizures who presents to the emergency department with low oxygen saturations.   Patient had a fall in the bathroom this morning, was noted to be saturating no higher than the mid 70s on his usual supplemental oxygen.  Patient reports increased exertional dyspnea recently but denies feeling short of breath at rest even when his oxygen saturations are in the 70s.  He denies any chest pain, fever, or chills.  He initially denies any cough but later acknowledges recent cough with sputum production.  He denies leg swelling.  ED Course: Upon arrival to the ED, patient is found to be afebrile, saturating 70s on 5 L/min of supplemental oxygen, tachypneic, tachycardic, and with systolic blood pressure as low as 88.  EKG demonstrates sinus tachycardia with RAD.  Chest x-ray notable for interstitial changes which may be chronic, small right pleural effusion, and cardiomegaly.  Blood work is notable for creatinine 1.65, albumin 2.8, WBC 14,400, D-dimer 0.55, lactic acid 3.6, troponin 20, and BNP 255.  Blood cultures were collected in the ED, 2 L of IV fluids were administered, and he was also given 125 mg IV Solu-Medrol, 10 mg albuterol, and 0.5 mg Atrovent.  Review of Systems:  All other systems reviewed and apart from HPI, are negative.  Past Medical History:  Diagnosis Date   Allergic rhinitis 10/24/2009   Altered bowel habits 10/04/2020   Basal cell carcinoma    Benign prostatic hyperplasia with lower urinary tract symptoms 10/24/2009   Benign prostatic hypertrophy    Bradycardia 12/06/2019   Callosity 10/16/2013   Cardiomegaly  01/23/2015   Chronic diastolic heart failure ([place]) 12/19/2018   Chronic respiratory failure ([place]) 10/04/2020   Coronary artery calcification seen on CAT scan 05/28/2020   Dyslipidemia    Dyspnea on exertion 03/18/2018   Emphysema lung ([place]) 03/18/2018   Encounter for general adult medical examination without abnormal findings 01/17/2015   Ex-cigarette smoker 01/20/2021   Exudative age-related macular degeneration of left eye with active choroidal neovascularization ([place]) 12/18/2019   Gallbladder problem    Gastroesophageal reflux disease    Hiatal hernia    Hilar adenopathy 03/25/2018   History of smoking 03/18/2018   Hyperlipidemia 10/24/2009   Insomnia 05/09/2013   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   Internal hemorrhoid 10/04/2020   Macular degeneration    Macular pucker, right eye 04/30/2020   Mediastinal adenopathy 03/25/2018   Memory loss 05/09/2013   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   Mild memory disturbance    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   Noninfective gastroenteritis and colitis, unspecified 10/04/2020   Nuclear sclerotic cataract of right eye 12/18/2019   Other specified symptoms and signs involving the digestive system and abdomen 10/04/2020   Overweight 09/14/2018   Oxygen dependent 10/04/2020   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 His smoking use  included cigarettes. He has a 50.00 pack-year smoking history. He has never used smokeless tobacco. He reports current alcohol use of about 15.0 - 20.0 standard drinks of alcohol per week. He reports that he does not use drugs.  No Known Allergies  Family History  Problem Relation Age of Onset   Obesity Mother    Memory loss Mother      Prior to Admission medications   Medication Sig Start Date End Date Taking? Authorizing Provider  ANORO ELLIPTA 62.5-25 MCG/ACT AEPB Inhale 1 puff into the lungs daily. 06/08/22   [provider]  aspirin 81 MG EC tablet Take 81 mg by mouth daily.    [provider]  atorvastatin (LIPITOR) 20 MG tablet Take 20 mg by mouth every evening.    [provider]  cholecalciferol (VITAMIN D) 1000 UNITS tablet Take 1,000 Units by mouth daily.    [provider]  dextromethorphan-guaiFENesin (MUCINEX DM) 30-600 MG 12hr tablet Take 1 tablet by mouth 2 (two) times daily. 08/07/22   Allie Bossier, MD  donepezil (ARICEPT) 10 MG tablet Take 1 tablet (10 mg total) by mouth daily. Patient taking differently: Take 10 mg by mouth at bedtime. 02/03/22   Suzzanne Cloud, NP  fexofenadine (ALLEGRA) 180 MG tablet Take 180 mg by mouth daily.    [provider]  finasteride (PROSCAR) 5 MG tablet Take 5 mg by mouth at bedtime.    [provider]  ipratropium (ATROVENT) 0.06 % nasal spray Place 1-2 sprays into both nostrils See admin instructions. 2 sprays in the right nostril and 1 spray in the left nostril once daily 02/09/13   [provider]  ipratropium-albuterol (DUONEB) 0.5-2.5 (3) MG/3ML SOLN Take 3 mLs by nebulization 2 (two) times daily. 08/25/22   Hunsucker, Bonna Gains, MD  levETIRAcetam (KEPPRA) 250 MG tablet Take 1-2 tablets (250-500 mg total) by mouth See admin instructions. Take 250 mg by mouth in the morning and 500 mg by mouth at night. Patient taking differently: Take 250-500 mg by mouth See admin  instructions. Take one tablet (250 mg) by mouth in the morning and two tablets (500 mg) by mouth at night. 07/30/22 10/28/22  Alric Ran, MD  Melatonin 10 MG TABS Take 20 mg by mouth at bedtime.    [provider]  nitroGLYCERIN (NITROSTAT) 0.4 MG SL tablet Place 1 tablet (0.4 mg total) under the tongue every 5 (five) minutes as needed. Chest pain Patient taking differently: Place 0.4 mg under the tongue every 5 (five) minutes as needed for chest pain. Chest pain 07/15/21 08/20/22  Park Liter, MD  omeprazole (PRILOSEC) 40 MG capsule Take 40 mg by mouth daily.  03/29/13   [provider]  OXYGEN Inhale 2-5 L into the lungs as directed. 5lt during the day and 2lt at night    [provider]  predniSONE (DELTASONE) 10 MG tablet Take 1 tablet (10 mg total) by mouth daily with breakfast. Take as directed to complete taper 08/20/22   Park Liter, MD  sacubitril-valsartan (ENTRESTO) 24-26 MG Take 1 tablet by mouth daily. Patient taking differently: Take 1 tablet by mouth every evening. At 3pm 02/20/22   Park Liter, MD  thiamine 100 MG tablet Take 1 tablet (100 mg total) by mouth daily. 12/13/18   Geradine Girt, DO  traZODone (DESYREL) 100 MG tablet TAKE 2 TABLETS BY MOUTH AT  BEDTIME 08/25/22   Suzzanne Cloud, NP    Physical Exam: Vitals:   09/10/22 1900 09/10/22 1930 09/10/22 1945 09/10/22 2015  BP: (!) 91/53 (!) 101/59 107/63 109/65  Pulse: 69 81 82 92  Resp: (!) '21 20 20 '$ (!) 21  Temp:      TempSrc:      SpO2: 98% 98% 99% 92%    Constitutional: NAD, no pallor or diaphoresis   Eyes: PERTLA, lids and conjunctivae normal ENMT: Mucous membranes are moist. Posterior pharynx clear of any exudate or lesions.   Neck: supple, no masses  Respiratory: Diminished bilaterally with prolonged expiratory phase. No accessory muscle use.  Cardiovascular: S1 & S2 heard, regular rate and rhythm. No extremity edema.   Abdomen: No distension, no tenderness, soft.  Bowel sounds active.  Musculoskeletal: no clubbing / cyanosis. No joint deformity upper and lower extremities.   Skin: no significant rashes, lesions, ulcers. Warm, dry, well-perfused. Neurologic: CN 2-12 grossly intact. Moving all extremities. Alert and oriented.  Psychiatric: Calm. Cooperative.    Labs and Imaging on Admission: I have personally reviewed following labs and imaging studies  CBC: Recent Labs  Lab 09/10/22 1743  WBC 14.4*  NEUTROABS 7.3  HGB 12.4*  HCT 40.1  MCV 87.9  PLT 258   Basic Metabolic Panel: Recent Labs  Lab 09/10/22 1743  NA 136  K 4.1  CL 104  CO2 19*  GLUCOSE 137*  BUN 19  CREATININE 1.65*  CALCIUM 8.4*   GFR: CrCl cannot be calculated (Unknown ideal weight.). Liver Function Tests: Recent Labs  Lab 09/10/22 1743  AST 23  ALT 20  ALKPHOS 44  BILITOT 1.0  PROT 5.9*  ALBUMIN 2.8*   No results for input(s): "LIPASE", "AMYLASE" in the last 168 hours. No results for input(s): "AMMONIA" in the last 168 hours. Coagulation Profile: No results for input(s): "INR", "PROTIME" in the last 168 hours. Cardiac Enzymes: No results for input(s): "CKTOTAL", "CKMB", "CKMBINDEX", "TROPONINI" in the last 168 hours. BNP (last 3 results) No results for input(s): "PROBNP" in the last 8760 hours. HbA1C: No results for input(s): "HGBA1C" in the last 72 hours. CBG: No results for input(s): "GLUCAP" in the last 168 hours. Lipid Profile: No results for input(s): "CHOL", "HDL", "LDLCALC", "TRIG", "CHOLHDL", "LDLDIRECT" in the last 72 hours. Thyroid Function Tests: No results for input(s): "TSH", "T4TOTAL", "FREET4", "T3FREE", "THYROIDAB" in the last 72 hours. Anemia Panel: No results for input(s): "VITAMINB12", "FOLATE", "FERRITIN", "TIBC", "IRON", "RETICCTPCT" in the last 72 hours. Urine analysis:    Component Value Date/Time   COLORURINE YELLOW 07/21/2022 [place] 07/21/2022 1148   LABSPEC 1.010 07/21/2022 1148   PHURINE 5.0  07/21/2022 1148   GLUCOSEU NEGATIVE 07/21/2022 1148   HGBUR NEGATIVE 07/21/2022 [place] 07/21/2022 1148   [place] 07/21/2022 1148   PROTEINUR NEGATIVE 07/21/2022 1148   NITRITE NEGATIVE 07/21/2022 1148   LEUKOCYTESUR NEGATIVE 07/21/2022 1148   Sepsis Labs: '@LABRCNTIP'$ (procalcitonin:4,lacticidven:4) ) Recent Results (from the past 240 hour(s))  Resp panel by RT-PCR (RSV, Flu A&B, Covid) Anterior Nasal Swab     Status: None   Collection Time: 09/10/22  6:08 PM   Specimen: Anterior Nasal Swab  Result Value Ref Range Status   SARS Coronavirus 2 by RT PCR NEGATIVE NEGATIVE Final  Comment: (NOTE) SARS-CoV-2 target nucleic acids are NOT DETECTED.  The SARS-CoV-2 RNA is generally detectable in upper respiratory specimens during the acute phase of infection. The lowest concentration of SARS-CoV-2 viral copies this assay can detect is 138 copies/mL. A negative result does not preclude SARS-Cov-2 infection and should not be used as the sole basis for treatment or other patient management decisions. A negative result may occur with  improper specimen collection/handling, submission of specimen other than nasopharyngeal swab, presence of viral mutation(s) within the areas targeted by this assay, and inadequate number of viral copies(<138 copies/mL). A negative result must be combined with clinical observations, patient history, and epidemiological information. The expected result is Negative.  Fact Sheet for Patients:  EntrepreneurPulse.com.au  Fact Sheet for Healthcare Providers:  IncredibleEmployment.be  This test is no t yet approved or cleared by the [place] FDA and  has been authorized for detection and/or diagnosis of SARS-CoV-2 by FDA under an Emergency Use Authorization (EUA). This EUA will remain  in effect (meaning this test can be used) for the duration of the COVID-19 declaration under Section  564(b)(1) of the Act, 21 U.S.C.section 360bbb-3(b)(1), unless the authorization is terminated  or revoked sooner.       Influenza A by PCR NEGATIVE NEGATIVE Final   Influenza B by PCR NEGATIVE NEGATIVE Final    Comment: (NOTE) The Xpert Xpress SARS-CoV-2/FLU/RSV plus assay is intended as an aid in the diagnosis of influenza from Nasopharyngeal swab specimens and should not be used as a sole basis for treatment. Nasal washings and aspirates are unacceptable for Xpert Xpress SARS-CoV-2/FLU/RSV testing.  Fact Sheet for Patients: EntrepreneurPulse.com.au  Fact Sheet for Healthcare Providers: IncredibleEmployment.be  This test is not yet approved or cleared by the [place] FDA and has been authorized for detection and/or diagnosis of SARS-CoV-2 by FDA under an Emergency Use Authorization (EUA). This EUA will remain in effect (meaning this test can be used) for the duration of the COVID-19 declaration under Section 564(b)(1) of the Act, 21 U.S.C. section 360bbb-3(b)(1), unless the authorization is terminated or revoked.     Resp Syncytial Virus by PCR NEGATIVE NEGATIVE Final    Comment: (NOTE) Fact Sheet for Patients: EntrepreneurPulse.com.au  Fact Sheet for Healthcare Providers: IncredibleEmployment.be  This test is not yet approved or cleared by the [place] FDA and has been authorized for detection and/or diagnosis of SARS-CoV-2 by FDA under an Emergency Use Authorization (EUA). This EUA will remain in effect (meaning this test can be used) for the duration of the COVID-19 declaration under Section 564(b)(1) of the Act, 21 U.S.C. section 360bbb-3(b)(1), unless the authorization is terminated or revoked.  Performed at [facility], [place] [address] [place], [place] [address]      Radiological Exams on Admission: DG Chest Portable 1 View  Result Date: 09/10/2022 CLINICAL DATA:   Shortness of breath EXAM: PORTABLE CHEST 1 VIEW COMPARISON:  08/02/2022 x-ray and older.  CT angiogram 07/20/2022 FINDINGS: Underinflation. Enlarged cardiopericardial silhouette. Diffuse interstitial changes and other chronic lung changes. Similar to previous. Question tiny right effusion. No pneumothorax. Overlapping cardiac leads. Apical pleural thickening on the right. IMPRESSION: Underinflation with interstitial changes, possibly chronic. Small right pleural effusion. Enlarged heart. Appearance is similar to previous Electronically Signed   By: Jill Side M.D.   On: 09/10/2022 18:13    EKG: Independently reviewed. Sinus tachycardia, rate 118, RAD.   Assessment/Plan   1. COPD exacerbation; acute on chronic hypoxic respiratory failure  - Check sputum culture,  continue systemic steroids, start antibiotic, schedule DuoNebs and use additional SABA prn, continue supplemental O2   2. AKI - SCr is 1.65 on admission, up from 1.18 on January 9th  - He was given 2 liters IVF in ED  - Check UA with microscopy, FENa, and renal US, renally-dose medications, repeat chem panel in am     3. Chronic HFpEF  - Appears compensated  - Continue Entresto, monitor volume status   4. History of seizures  - Continue Keppra   5. Memory loss  - Continue Aricept, use delirium precautions    DVT prophylaxis: sq heparin  Code Status: Full  Level of Care: Level of care: Telemetry Medical Family Communication: None present   Disposition Plan:  Patient is from: Home  Anticipated d/c is to: TBD Anticipated d/c date is: 09/14/22  Patient currently: Pending improved respiratory status  Consults called: none  Admission status: Inpatient     Vianne Bulls, MD [facility]  09/10/2022, 8:37 PM

## 2022-09-10 NOTE — Telephone Encounter (Signed)
Wife calling concerned for husband.   He was in Hospital for pneumonia in Dec 2 x's  He has O2 on settings  5 day and 2 night.  This morning at 6:30 he went to the restroom and fell. Wife wonders if O'@2was'$  too low and turned it to 5.  When he arose again at 9:30 his O2 read no higher than 76.  After he uses his nebulizer he starts reading at 5-89  Wife is concerned that he starts going down hill as he comes off his meds (pred & antibx). Wanted Dr. Lemmie Evens to know. Is a chest xray in line possibly? After consulting w/Dr. H Please call wife @ [phone] is wifes #  She prefers to speak to Dr. in person.

## 2022-09-10 NOTE — ED Provider Notes (Incomplete)
[facility] Provider Note   CSN: [id number] Arrival date & time: 09/10/22  1722     History {Add pertinent medical, surgical, social history, OB history to HPI:1} Chief Complaint  Patient presents with  . Shortness of Breath    Frank Meyer is a 82 y.o. 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   Patient states that lately at home he has had to utilize more oxygen. He has noted SpO2s in the 60s-70s upon waking for several days.   Patient's oxygen 72% on 6L [place]. Patient placed on NRB at [facility]. Patient O2 increased to 99%    Shortness of Breath      Home Medications Prior to Admission medications   Medication Sig Start Date End Date Taking? Authorizing Provider  ANORO ELLIPTA 62.5-25 MCG/ACT AEPB Inhale 1 puff into the lungs daily. 06/08/22   [provider]  aspirin 81 MG EC tablet Take 81 mg by mouth daily.    [provider]  atorvastatin (LIPITOR) 20 MG tablet Take 20 mg by mouth every evening.    [provider]  cholecalciferol (VITAMIN D) 1000 UNITS tablet Take 1,000 Units by mouth daily.    [provider]  dextromethorphan-guaiFENesin (MUCINEX DM) 30-600 MG 12hr tablet Take 1 tablet by mouth 2 (two) times daily. 08/07/22   Allie Bossier, MD  donepezil (ARICEPT) 10 MG tablet Take 1 tablet (10 mg total) by mouth daily. Patient taking differently: Take 10 mg by mouth at bedtime. 02/03/22   Suzzanne Cloud, NP  fexofenadine (ALLEGRA) 180 MG tablet Take 180 mg by mouth daily.    [provider]  finasteride (PROSCAR) 5 MG tablet Take 5 mg by mouth at bedtime.    [provider]  ipratropium (ATROVENT) 0.06 % nasal spray Place 1-2 sprays into both nostrils See admin instructions. 2 sprays in the right nostril and 1  spray in the left nostril once daily 02/09/13   [provider]  ipratropium-albuterol (DUONEB) 0.5-2.5 (3) MG/3ML SOLN Take 3 mLs by nebulization 2 (two) times daily. 08/25/22   Hunsucker, Bonna Gains, MD  levETIRAcetam (KEPPRA) 250 MG tablet Take 1-2 tablets (250-500 mg total) by mouth See admin instructions. Take 250 mg by mouth in the morning and 500 mg by mouth at night. Patient taking differently: Take 250-500 mg by mouth See admin instructions. Take one tablet (250 mg) by mouth in the morning and two tablets (500 mg) by mouth at night. 07/30/22 10/28/22  Alric Ran, MD  Melatonin 10 MG TABS Take 20 mg by mouth at bedtime.    [provider]  nitroGLYCERIN (NITROSTAT) 0.4 MG SL tablet Place 1 tablet (0.4 mg total) under the tongue every 5 (five) minutes as needed. Chest pain Patient taking differently: Place 0.4 mg under the tongue every 5 (five) minutes as needed for chest pain. Chest pain 07/15/21 08/20/22  Park Liter, MD  omeprazole (PRILOSEC) 40 MG capsule Take 40 mg by mouth daily.  03/29/13   [provider]  OXYGEN Inhale 2-5 L into the lungs as directed. 5lt during the day and 2lt at night    [provider]  predniSONE (DELTASONE) 10 MG tablet Take 1 tablet (10 mg total) by mouth daily with breakfast. Take as directed to complete taper 08/20/22   Park Liter, MD  sacubitril-valsartan (ENTRESTO) 24-26 MG Take 1 tablet by mouth daily. Patient taking differently: Take 1 tablet by mouth every evening. At 3pm 02/20/22   Park Liter, MD  thiamine 100 MG tablet Take 1 tablet (100 mg total) by mouth daily. 12/13/18   Geradine Girt, DO  traZODone (DESYREL) 100 MG tablet TAKE 2 TABLETS BY MOUTH AT  BEDTIME 08/25/22   Suzzanne Cloud, NP      Allergies    Patient has no known allergies.    Review of Systems   Review of Systems  Respiratory:  Positive for shortness of breath.    Review of systems {pos/neg:18640::"Negative","Positive"} for  ***.  A 10 point review of systems was performed and is negative unless otherwise reported in HPI.  Physical Exam Updated Vital Signs BP (!) 130/118 (BP Location: Right Arm)   Pulse 85   Temp 97.8 F (36.6 C) (Oral)   Resp 18   SpO2 100%  Physical Exam General: Normal appearing {Desc; male/male:11659}, lying in bed.  HEENT: PERRLA, Sclera anicteric, MMM, trachea midline.  Cardiology: RRR, no murmurs/rubs/gallops. BL radial and DP pulses equal bilaterally.  Resp: Normal respiratory rate and effort. CTAB, no wheezes, rhonchi, crackles.  Abd: Soft, non-tender, non-distended. No rebound tenderness or guarding.  GU: Deferred. MSK: No peripheral edema or signs of trauma. Extremities without deformity or TTP. No cyanosis or clubbing. Skin: warm, dry. No rashes or lesions. Back: No CVA tenderness Neuro: A&Ox4, CNs II-XII grossly intact. MAEs. Sensation grossly intact.  Psych: Normal mood and affect.   ED Results / Procedures / Treatments   Labs (all labs ordered are listed, but only abnormal results are displayed) Labs Reviewed  CULTURE, BLOOD (ROUTINE X 2)  CULTURE, BLOOD (ROUTINE X 2)  BRAIN NATRIURETIC PEPTIDE  COMPREHENSIVE METABOLIC PANEL  CBC WITH DIFFERENTIAL/PLATELET  D-DIMER, QUANTITATIVE (NOT AT [facility])  LACTIC ACID, PLASMA  LACTIC ACID, PLASMA  TROPONIN I (HIGH SENSITIVITY)    EKG None  Radiology No results found.  Procedures Procedures  {Document cardiac monitor, telemetry assessment procedure when appropriate:1}  Medications Ordered in ED Medications  sodium chloride 0.9 % bolus 1,000 mL (1,000 mLs Intravenous New Bag/Given 09/10/22 1755)    ED Course/ Medical Decision Making/ A&P                          Medical Decision Making Amount and/or Complexity of Data Reviewed Labs: ordered. Decision-making details documented in ED Course. Radiology: ordered. Decision-making details documented in ED Course.  Risk Prescription drug management. Decision  regarding hospitalization.    This patient presents to the ED for concern of ***, this involves an extensive number of treatment options, and is a complaint that carries with it a high risk of complications and morbidity.  I considered the following differential and admission for this acute, potentially life threatening condition.   MDM:    ***  Clinical Course as of 09/21/22 1222  Thu Sep 10, 2022  1906 B Natriuretic Peptide(!): 254.8 [HN]  1906 WBC(!): 14.4 [HN]  1906 Troponin I (High Sensitivity)(!): 20 [HN]  1907 Creatinine(!): 1.65 AKI up from 1.18 [HN]  1907 DG Chest Portable 1 View Underinflation with interstitial changes, possibly chronic. Small right pleural effusion.  Enlarged heart.  Appearance is similar to previous   [HN]  1907 BP(!): 95/56 Improved after 1L NS [HN]  1921 Lactic Acid, Venous(!!): 3.6 Receiving fluid [HN]  1934 Resp panel by RT-PCR (RSV, Flu A&B, Covid) Anterior Nasal  Swab neg [HN]    Clinical Course User Index [HN] Audley Hose, MD    Labs: I Ordered, and personally interpreted labs.  The pertinent results include:  ***  Imaging Studies ordered: I ordered imaging studies including *** I independently visualized and interpreted imaging. I agree with the radiologist interpretation  Additional history obtained from ***.  External records from outside source obtained and reviewed including ***  Cardiac Monitoring: .The patient was maintained on a cardiac monitor.  I personally viewed and interpreted the cardiac monitored which showed an underlying rhythm of: ***  Reevaluation: After the interventions noted above, I reevaluated the patient and found that they have :{resolved/improved/worsened:23923::"improved"}  Social Determinants of Health: .***  Disposition:  ***  Co morbidities that complicate the patient evaluation . Past Medical History:  Diagnosis Date  . Allergic rhinitis 10/24/2009  . Altered bowel habits 10/04/2020   . Basal cell carcinoma   . Benign prostatic hyperplasia with lower urinary tract symptoms 10/24/2009  . Benign prostatic hypertrophy   . Bradycardia 12/06/2019  . Callosity 10/16/2013  . Cardiomegaly 01/23/2015  . Chronic diastolic heart failure ([place]) 12/19/2018  . Chronic respiratory failure ([place]) 10/04/2020  . Coronary artery calcification seen on CAT scan 05/28/2020  . Dyslipidemia   . Dyspnea on exertion 03/18/2018  . Emphysema lung ([place]) 03/18/2018  . Encounter for general adult medical examination without abnormal findings 01/17/2015  . Ex-cigarette smoker 01/20/2021  . Exudative age-related macular degeneration of left eye with active choroidal neovascularization ([place]) 12/18/2019  . Gallbladder problem   . Gastroesophageal reflux disease   . Hiatal hernia   . Hilar adenopathy 03/25/2018  . History of smoking 03/18/2018  . Hyperlipidemia 10/24/2009  . 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  . Internal hemorrhoid 10/04/2020  . Macular degeneration   . Macular pucker, right eye 04/30/2020  . Mediastinal adenopathy 03/25/2018  . Memory loss 05/09/2013  . 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 Mild memory disturbance   . 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 Noninfective gastroenteritis and colitis, unspecified 10/04/2020  . Nuclear sclerotic cataract of right eye 12/18/2019  . Other specified symptoms and signs involving the digestive system and abdomen 10/04/2020  . Overweight 09/14/2018  . Oxygen dependent 10/04/2020  . 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 Personal history of colonic polyps 10/04/2020  . Posterior vitreous detachment of right eye 12/18/2019  . 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  . Pulmonary hypertension ([place])   . Right upper lobe pulmonary nodule 03/25/2018  . Syncope and collapse 12/12/2018  . TIA (transient ischemic attack)   .  Transient ischemic attack 10/24/2009  . Weight decreased 10/04/2020     Medicines Meds ordered this encounter  Medications  . sodium chloride 0.9 % bolus 1,000 mL    I have reviewed the patients home medicines and have made adjustments as needed  Problem List / ED Course: Problem List Items Addressed This Visit   None        {Document critical care time when appropriate:1} {Document review of labs and clinical decision tools ie heart score, Chads2Vasc2 etc:1}  {Document your independent review of radiology images, and any outside records:1} {Document your discussion with family members, caretakers, and with consultants:1} {Document social determinants of health affecting pt's care:1} {Document your decision making why or why not admission, treatments were needed:1}  This note was created using dictation software, which may contain spelling or grammatical errors.

## 2022-09-10 NOTE — Telephone Encounter (Signed)
Called and spoke with pt's spouse letting her know that if pt's O2 sats are ranging from 76-88 on 5L O2 that pt needed to go to the ED for evaluation especially if he has fallen probably due to low O2 sats. Stated to her that we did not have any openings at the office and he really needed to get checked out and that the ED is where he needs to go. Understanding was verbalized. Nothing further needed.

## 2022-09-10 NOTE — ED Notes (Signed)
PA at bedside.

## 2022-09-10 NOTE — ED Triage Notes (Signed)
Pt from home with reports of SHOB. Pt on 2L chronically. Pt stating his oxygen has been low today. Pt has oxygen on 4L with sat of 75%. Pt with cyanotic lips.

## 2022-09-10 NOTE — ED Provider Notes (Addendum)
[facility] Provider Note   CSN: [id number] Arrival date & time: 09/10/22  1722     History  Chief Complaint  Patient presents with   Shortness of Breath   HPI Frank Meyer is a 82 y.o. male with COPD, CKD, CHF, and cardiomegaly presenting for shortness of breath.  Patient is normally on 5 L of oxygen during the day and 2 L of oxygen at night for COPD.  Last couple days he has had difficulty breathing.  Worse with exertion. He has had to increase his oxygen supply.  This morning he was having difficulty maintaining his sats.  Increase his oxygen to 7 L to achieve sats in the 80s.  Wife called his pulmonology clinic, who advised that he be evaluated in the ED.  Recent admission for pneumonia in December.  Denies chest pain or fever.  Denies bleeding. No recent abnormal weight gain. No calf tenderness.     Shortness of Breath      Home Medications Prior to Admission medications   Medication Sig Start Date End Date Taking? Authorizing Provider  ANORO ELLIPTA 62.5-25 MCG/ACT AEPB Inhale 1 puff into the lungs daily. 06/08/22   [provider]  aspirin 81 MG EC tablet Take 81 mg by mouth daily.    [provider]  atorvastatin (LIPITOR) 20 MG tablet Take 20 mg by mouth every evening.    [provider]  cholecalciferol (VITAMIN D) 1000 UNITS tablet Take 1,000 Units by mouth daily.    [provider]  dextromethorphan-guaiFENesin (MUCINEX DM) 30-600 MG 12hr tablet Take 1 tablet by mouth 2 (two) times daily. 08/07/22   Allie Bossier, MD  donepezil (ARICEPT) 10 MG tablet Take 1 tablet (10 mg total) by mouth daily. Patient taking differently: Take 10 mg by mouth at bedtime. 02/03/22   Suzzanne Cloud, NP  fexofenadine (ALLEGRA) 180 MG tablet Take 180 mg by mouth daily.    [provider]  finasteride (PROSCAR) 5 MG tablet Take 5 mg by mouth at bedtime.    [provider]  ipratropium  (ATROVENT) 0.06 % nasal spray Place 1-2 sprays into both nostrils See admin instructions. 2 sprays in the right nostril and 1 spray in the left nostril once daily 02/09/13   [provider]  ipratropium-albuterol (DUONEB) 0.5-2.5 (3) MG/3ML SOLN Take 3 mLs by nebulization 2 (two) times daily. 08/25/22   Hunsucker, Bonna Gains, MD  levETIRAcetam (KEPPRA) 250 MG tablet Take 1-2 tablets (250-500 mg total) by mouth See admin instructions. Take 250 mg by mouth in the morning and 500 mg by mouth at night. Patient taking differently: Take 250-500 mg by mouth See admin instructions. Take one tablet (250 mg) by mouth in the morning and two tablets (500 mg) by mouth at night. 07/30/22 10/28/22  Alric Ran, MD  Melatonin 10 MG TABS Take 20 mg by mouth at bedtime.    [provider]  nitroGLYCERIN (NITROSTAT) 0.4 MG SL tablet Place 1 tablet (0.4 mg total) under the tongue every 5 (five) minutes as needed. Chest pain Patient taking differently: Place 0.4 mg under the tongue every 5 (five) minutes as needed for chest pain. Chest pain 07/15/21 08/20/22  Park Liter, MD  omeprazole (PRILOSEC) 40 MG capsule Take 40 mg by mouth daily.  03/29/13   [provider]  OXYGEN Inhale 2-5 L into the lungs as directed. 5lt during the day and 2lt at night    [provider]  predniSONE (DELTASONE) 10 MG tablet Take 1 tablet (10 mg total) by mouth daily with breakfast. Take as directed to complete taper 08/20/22   Park Liter, MD  sacubitril-valsartan (ENTRESTO) 24-26 MG Take 1 tablet by mouth daily. Patient taking differently: Take 1 tablet by mouth every evening. At 3pm 02/20/22   Park Liter, MD  thiamine 100 MG tablet Take 1 tablet (100 mg total) by mouth daily. 12/13/18   Geradine Girt, DO  traZODone (DESYREL) 100 MG tablet TAKE 2 TABLETS BY MOUTH AT  BEDTIME 08/25/22   Suzzanne Cloud, NP      Allergies    Patient has no known allergies.    Review of Systems   Review of  Systems  Respiratory:  Positive for shortness of breath.     Physical Exam   Vitals:   09/10/22 1845 09/10/22 1900  BP: (!) 95/56 (!) 91/53  Pulse: 75 69  Resp: (!) 21 (!) 21  Temp:    SpO2: 98% 98%    CONSTITUTIONAL:  ill-appearing, NAD, pale NEURO:  Alert and oriented x 3, CN 3-12 grossly intact EYES:  eyes equal and reactive ENT/NECK:  Supple, no stridor  CARDIO: regular rate and rhythm, appears well-perfused  PULM: Satting 100% on nonrebreather, tachypneic.  Diminished breath sounds in the lower lung fields, R>L.  No wheezing, rhonchi or rales. GI/GU:  non-distended, soft MSK/SPINE:  No gross deformities, no edema, moves all extremities  SKIN:  pale   *Additional and/or pertinent findings included in MDM below    ED Results / Procedures / Treatments   Labs (all labs ordered are listed, but only abnormal results are displayed) Labs Reviewed  BRAIN NATRIURETIC PEPTIDE - Abnormal; Notable for the following components:      Result Value   B Natriuretic Peptide 254.8 (*)    All other components within normal limits  COMPREHENSIVE METABOLIC PANEL - Abnormal; Notable for the following components:   CO2 19 (*)    Glucose, Bld 137 (*)    Creatinine, Ser 1.65 (*)    Calcium 8.4 (*)    Total Protein 5.9 (*)    Albumin 2.8 (*)    GFR, Estimated 41 (*)    All other components within normal limits  CBC WITH DIFFERENTIAL/PLATELET - Abnormal; Notable for the following components:   WBC 14.4 (*)    Hemoglobin 12.4 (*)    RDW 16.6 (*)    Lymphs Abs 5.0 (*)    Monocytes Absolute 1.4 (*)    Abs Immature Granulocytes 0.16 (*)    All other components within normal limits  LACTIC ACID, PLASMA - Abnormal; Notable for the following components:   Lactic Acid, Venous 3.6 (*)    All other components within normal limits  TROPONIN I (HIGH SENSITIVITY) - Abnormal; Notable for the following components:   Troponin I (High Sensitivity) 20 (*)    All other components within normal limits   CULTURE, BLOOD (ROUTINE X 2)  CULTURE, BLOOD (ROUTINE X 2)  RESP PANEL BY RT-PCR (RSV, FLU A&B, COVID)  [id number]  D-DIMER, QUANTITATIVE (NOT AT [facility])  LACTIC ACID, PLASMA  URINALYSIS, ROUTINE W REFLEX MICROSCOPIC  TROPONIN I (HIGH SENSITIVITY)    EKG EKG Interpretation  Date/Time:  Thursday September 10 2022 17:30:41 EST Ventricular Rate:  118 PR Interval:  148 QRS Duration: 72 QT Interval:  346 QTC Calculation: 484 R Axis:   108 Text Interpretation: Sinus tachycardia Rightward axis Confirmed by Cindee Lame [phone]) on  09/10/2022 6:03:07 PM  Radiology DG Chest Portable 1 View  Result Date: 09/10/2022 CLINICAL DATA:  Shortness of breath EXAM: PORTABLE CHEST 1 VIEW COMPARISON:  08/02/2022 x-ray and older.  CT angiogram 07/20/2022 FINDINGS: Underinflation. Enlarged cardiopericardial silhouette. Diffuse interstitial changes and other chronic lung changes. Similar to previous. Question tiny right effusion. No pneumothorax. Overlapping cardiac leads. Apical pleural thickening on the right. IMPRESSION: Underinflation with interstitial changes, possibly chronic. Small right pleural effusion. Enlarged heart. Appearance is similar to previous Electronically Signed   By: Jill Side M.D.   On: 09/10/2022 18:13    Procedures Procedures    Medications Ordered in ED Medications  sodium chloride 0.9 % bolus 1,000 mL (0 mLs Intravenous Stopped 09/10/22 1839)  albuterol (PROVENTIL) (2.5 MG/3ML) 0.083% nebulizer solution 10 mg (10 mg Nebulization Given 09/10/22 1820)  ipratropium (ATROVENT) nebulizer solution 0.5 mg (0.5 mg Nebulization Given 09/10/22 1821)  magnesium sulfate IVPB 2 g 50 mL (0 g Intravenous Stopped 09/10/22 1917)  methylPREDNISolone sodium succinate (SOLU-MEDROL) 125 mg/2 mL injection 125 mg (125 mg Intravenous Given 09/10/22 1812)  lactated ringers bolus 1,000 mL (1,000 mLs Intravenous New Bag/Given 09/10/22 1925)    ED Course/ Medical Decision Making/ A&P Clinical Course as of  09/10/22 1938  Thu Sep 10, 2022  1906 B Natriuretic Peptide(!): 254.8 [HN]  1906 WBC(!): 14.4 [HN]  1906 Troponin I (High Sensitivity)(!): 20 [HN]  1907 Creatinine(!): 1.65 AKI up from 1.18 [HN]  1907 DG Chest Portable 1 View Underinflation with interstitial changes, possibly chronic. Small right pleural effusion.  Enlarged heart.  Appearance is similar to previous   [HN]  1907 BP(!): 95/56 Improved after 1L NS [HN]  1921 Lactic Acid, Venous(!!): 3.6 Receiving fluid [HN]  1934 Resp panel by RT-PCR (RSV, Flu A&B, Covid) Anterior Nasal Swab neg [HN]    Clinical Course User Index [HN] Audley Hose, MD                             Medical Decision Making Amount and/or Complexity of Data Reviewed Labs: ordered. Decision-making details documented in ED Course. Radiology: ordered. Decision-making details documented in ED Course.  Risk Prescription drug management.   Initial Impression and Ddx 82 year old male who is ill-appearing, hypotensive, on nonrebreather presenting for shortness of breath.  Physical exam was notable for diminished air entry in the lower lung bases, and hypertension.  Differential diagnosis for this complaint includes COPD exacerbation, CHF, ACS, PE, pericardial effusion, and sepsis. Patient PMH that increases complexity of ED encounter: COPD, CHF, recent history pneumonia  Interpretation of Diagnostics I independent reviewed and interpreted the labs as followed: Lactic acidosis, elevated BNP, leukocytosis elevated troponin, elevated creatinine  - I independently visualized the following imaging with scope of interpretation limited to determining acute life threatening conditions related to emergency care: cxr , which revealed small right pleural effusion and underinflation with interstitial changes, enlarged heart.  -I personally reviewed and interpreted EKG which revealed sinus tachycardia  Patient Reassessment and Ultimate  Disposition/Management Initially patient was hypotensive.  Treated with LR bolus.  MAP improved with reevaluation.  Poor air entry and increasing oxygen requirement, primary concern of COPD exacerbation.  Treated with IV steroids, magnesium, albuterol and ipratropium.  Transition from nonrebreather to high flow nasal cannula.  Chest x-ray.  Similar to last in December with evidence of new small right pleural effusion.   Also considering sepsis, PE, ACS and CHF exacerbation.  Signed out patient to Dr.  Rushie Goltz for further evaluation and management.   Patient management required discussion with the following services or consulting groups:  None  Complexity of Problems Addressed Acute complicated illness or Injury  Additional Data Reviewed and Analyzed Further history obtained from: Prior ED visit notes, Recent discharge summary, and Recent Consult notes  Patient Encounter Risk Assessment Consideration of hospitalization         Final Clinical Impression(s) / ED Diagnoses Final diagnoses:  Acute respiratory failure with hypoxia [facility])    Rx / DC Orders ED Discharge Orders     None         Harriet Pho, PA-C 09/10/22 1941    Audley Hose, MD 10/02/22 1223

## 2022-09-10 NOTE — ED Notes (Signed)
MD at bedside. 

## 2022-09-11 DIAGNOSIS — J441 Chronic obstructive pulmonary disease with (acute) exacerbation: Secondary | ICD-10-CM | POA: Diagnosis present

## 2022-09-11 DIAGNOSIS — J9621 Acute and chronic respiratory failure with hypoxia: Secondary | ICD-10-CM | POA: Diagnosis not present

## 2022-09-11 LAB — BASIC METABOLIC PANEL
Anion gap: 11 (ref 5–15)
BUN: 16 mg/dL (ref 8–23)
CO2: 21 mmol/L — ABNORMAL LOW (ref 22–32)
Calcium: 7.8 mg/dL — ABNORMAL LOW (ref 8.9–10.3)
Chloride: 106 mmol/L (ref 98–111)
Creatinine, Ser: 1.19 mg/dL (ref 0.61–1.24)
GFR, Estimated: >60 mL/min (ref >60)
Glucose, Bld: 234 mg/dL — ABNORMAL HIGH (ref 70–99)
Potassium: 3.9 mmol/L (ref 3.5–5.1)
Sodium: 138 mmol/L (ref 135–145)

## 2022-09-11 LAB — CBC
HCT: 35.3 % — ABNORMAL LOW (ref 39.0–52.0)
Hemoglobin: 11.3 g/dL — ABNORMAL LOW (ref 13.0–17.0)
MCH: 28 pg (ref 26.0–34.0)
MCHC: 32 g/dL (ref 30.0–36.0)
MCV: 87.4 fL (ref 80.0–100.0)
Platelets: 222 10*3/uL (ref 150–400)
RBC: 4.04 MIL/uL — ABNORMAL LOW (ref 4.22–5.81)
RDW: 16.3 % — ABNORMAL HIGH (ref 11.5–15.5)
WBC: 7 10*3/uL (ref 4.0–10.5)
nRBC: 0 % (ref 0.0–0.2)

## 2022-09-11 LAB — URINALYSIS, COMPLETE (UACMP) WITH MICROSCOPIC
Bilirubin Urine: NEGATIVE
Glucose, UA: NEGATIVE mg/dL
Hgb urine dipstick: NEGATIVE
Ketones, ur: NEGATIVE mg/dL
Leukocytes,Ua: NEGATIVE
Nitrite: NEGATIVE
Protein, ur: NEGATIVE mg/dL
Specific Gravity, Urine: 1.01 (ref 1.005–1.030)
pH: 5 (ref 5.0–8.0)

## 2022-09-11 LAB — CREATININE, URINE, RANDOM: Creatinine, Urine: 67 mg/dL

## 2022-09-11 LAB — SODIUM, URINE, RANDOM: Sodium, Ur: 41 mmol/L

## 2022-09-11 NOTE — Progress Notes (Signed)
PROGRESS NOTE    Frank Meyer  IFO:[id number] DOB: 09/03/1940 DOA: 09/10/2022 PCP: Burnard Bunting, MD    Brief Narrative:  82 year old gentleman with history of COPD and chronic hypoxic respiratory failure on 5 L of oxygen at home, chronic diastolic heart failure, history of seizures presented to the emergency department for low oxygen saturation, an episode of fall at home.  According to the patient, early morning yesterday he fell while coming back from bathroom and not sure what happened.  Recently with increasing oxygen requirement and exertional dyspnea.  In the emergency room he was afebrile.  He was saturating 70% on 5 L of oxygen, he was tachypneic and tachycardic with systolic blood pressure 88.  EKG with sinus cardia.  Chest x-ray with interstitial changes mostly chronic.  Creatinine 1.65.  D-dimer 0.55.  Lactic acid 3.6.  Blood cultures were collected.  Patient was started on IV steroids and albuterol therapy and admitted to the hospital.   Assessment & Plan:   COPD with acute exacerbation: Acute on chronic hypoxemic respiratory failure secondary to COPD: COVID, RSV and flu negative. Agree with admission to monitored unit because of severity of symptoms. Aggressive bronchodilator therapy, IV steroids, inhalational steroids, scheduled and as needed bronchodilators, deep breathing exercises, incentive spirometry, chest physiotherapy. Antibiotics due to severity of symptoms.  Rocephin to continue while in the hospital. Supplemental oxygen to keep saturations more than 90%. Start physical and Occupational Therapy.  Acute kidney injury: Treated with IV fluids.  Renal functions back to baseline.  Discontinue further IV fluids.  Chronic medical issues including Chronic diastolic heart failure, well compensated on Entresto History of seizure, on Keppra Memory loss, on Aricept.  Delirium precautions.  Fall precautions.   DVT prophylaxis: heparin injection 5,000 Units Start:  09/10/22 2200   Code Status: Full code Family Communication: None at the bedside Disposition Plan: Status is: Inpatient Remains inpatient appropriate because: Significant shortness of breath     Consultants:  None  Procedures:  None  Antimicrobials:  Rocephin 1/25---   Subjective: Patient seen and examined in the morning rounds.  He was still in the emergency room.  He had some cough with tinge of blood on it and he was worried about it.  He tells me that his breathing is at about his baseline.  He uses 5 L of oxygen in the daytime.  Appetite is fair.  Afebrile. There were no family at the bedside while I examine him.  Objective: Vitals:   09/11/22 1100 09/11/22 1200 09/11/22 1210 09/11/22 1300  BP: 109/66 111/64  (!) 123/99  Pulse: 65 62  76  Resp: 16 (!) 27  (!) 28  Temp:   (!) 97.3 F (36.3 C)   TempSrc:   Oral   SpO2: 95% 95%  96%  Weight:      Height:        Intake/Output Summary (Last 24 hours) at 09/11/2022 1405 Last data filed at 09/11/2022 1128 Gross per 24 hour  Intake 2150 ml  Output 500 ml  Net 1650 ml   Filed Weights   09/10/22 2340  Weight: 72.6 kg    Examination:  General exam: Appears frail.  Chronically sick looking.  Not in any distress.  Able to talk in complete sentences. Respiratory system: Occasional expiratory wheezes but bilateral good air entry. SpO2: 92 % O2 Flow Rate (L/min): 7 L/min  Cardiovascular system: S1 & S2 heard, RRR. No pedal edema. Gastrointestinal system: Soft.  Nontender.  Bowel sound present. Central nervous  system: Alert and oriented. No focal neurological deficits. Extremities: Symmetric 5 x 5 power. Skin: No rashes, lesions or ulcers Psychiatry: Judgement and insight appear normal. Mood & affect appropriate.     Data Reviewed: I have personally reviewed following labs and imaging studies  CBC: Recent Labs  Lab 09/10/22 1743 09/11/22 0209  WBC 14.4* 7.0  NEUTROABS 7.3  --   HGB 12.4* 11.3*  HCT 40.1  35.3*  MCV 87.9 87.4  PLT 267 016   Basic Metabolic Panel: Recent Labs  Lab 09/10/22 1743 09/11/22 0209  NA 136 138  K 4.1 3.9  CL 104 106  CO2 19* 21*  GLUCOSE 137* 234*  BUN 19 16  CREATININE 1.65* 1.19  CALCIUM 8.4* 7.8*   GFR: Estimated Creatinine Clearance: 44.8 mL/min (by C-G formula based on SCr of 1.19 mg/dL). Liver Function Tests: Recent Labs  Lab 09/10/22 1743  AST 23  ALT 20  ALKPHOS 44  BILITOT 1.0  PROT 5.9*  ALBUMIN 2.8*   No results for input(s): "LIPASE", "AMYLASE" in the last 168 hours. No results for input(s): "AMMONIA" in the last 168 hours. Coagulation Profile: No results for input(s): "INR", "PROTIME" in the last 168 hours. Cardiac Enzymes: No results for input(s): "CKTOTAL", "CKMB", "CKMBINDEX", "TROPONINI" in the last 168 hours. BNP (last 3 results) No results for input(s): "PROBNP" in the last 8760 hours. HbA1C: No results for input(s): "HGBA1C" in the last 72 hours. CBG: No results for input(s): "GLUCAP" in the last 168 hours. Lipid Profile: No results for input(s): "CHOL", "HDL", "LDLCALC", "TRIG", "CHOLHDL", "LDLDIRECT" in the last 72 hours. Thyroid Function Tests: No results for input(s): "TSH", "T4TOTAL", "FREET4", "T3FREE", "THYROIDAB" in the last 72 hours. Anemia Panel: No results for input(s): "VITAMINB12", "FOLATE", "FERRITIN", "TIBC", "IRON", "RETICCTPCT" in the last 72 hours. Sepsis Labs: Recent Labs  Lab 09/10/22 1759 09/10/22 2006  LATICACIDVEN 3.6* 1.5    Recent Results (from the past 240 hour(s))  Blood culture (routine x 2)     Status: None (Preliminary result)   Collection Time: 09/10/22  5:59 PM   Specimen: BLOOD RIGHT FOREARM  Result Value Ref Range Status   Specimen Description BLOOD RIGHT FOREARM  Final   Special Requests   Final    BOTTLES DRAWN AEROBIC AND ANAEROBIC Blood Culture adequate volume   Culture   Final    NO GROWTH < 24 HOURS Performed at [facility], [address], [place],  [place] [address]    Report Status PENDING  Incomplete  Blood culture (routine x 2)     Status: None (Preliminary result)   Collection Time: 09/10/22  6:04 PM   Specimen: BLOOD LEFT FOREARM  Result Value Ref Range Status   Specimen Description BLOOD LEFT FOREARM  Final   Special Requests   Final    BOTTLES DRAWN AEROBIC AND ANAEROBIC Blood Culture adequate volume   Culture   Final    NO GROWTH < 24 HOURS Performed at [facility], [place] [address], [place], [place] [address]    Report Status PENDING  Incomplete  Resp panel by RT-PCR (RSV, Flu A&B, Covid) Anterior Nasal Swab     Status: None   Collection Time: 09/10/22  6:08 PM   Specimen: Anterior Nasal Swab  Result Value Ref Range Status   SARS Coronavirus 2 by RT PCR NEGATIVE NEGATIVE Final    Comment: (NOTE) SARS-CoV-2 target nucleic acids are NOT DETECTED.  The SARS-CoV-2 RNA is generally detectable in upper respiratory specimens  during the acute phase of infection. The lowest concentration of SARS-CoV-2 viral copies this assay can detect is 138 copies/mL. A negative result does not preclude SARS-Cov-2 infection and should not be used as the sole basis for treatment or other patient management decisions. A negative result may occur with  improper specimen collection/handling, submission of specimen other than nasopharyngeal swab, presence of viral mutation(s) within the areas targeted by this assay, and inadequate number of viral copies(<138 copies/mL). A negative result must be combined with clinical observations, patient history, and epidemiological information. The expected result is Negative.  Fact Sheet for Patients:  EntrepreneurPulse.com.au  Fact Sheet for Healthcare Providers:  IncredibleEmployment.be  This test is no t yet approved or cleared by the [place] FDA and  has been authorized for detection and/or diagnosis of SARS-CoV-2 by FDA under an Emergency Use Authorization  (EUA). This EUA will remain  in effect (meaning this test can be used) for the duration of the COVID-19 declaration under Section 564(b)(1) of the Act, 21 U.S.C.section 360bbb-3(b)(1), unless the authorization is terminated  or revoked sooner.       Influenza A by PCR NEGATIVE NEGATIVE Final   Influenza B by PCR NEGATIVE NEGATIVE Final    Comment: (NOTE) The Xpert Xpress SARS-CoV-2/FLU/RSV plus assay is intended as an aid in the diagnosis of influenza from Nasopharyngeal swab specimens and should not be used as a sole basis for treatment. Nasal washings and aspirates are unacceptable for Xpert Xpress SARS-CoV-2/FLU/RSV testing.  Fact Sheet for Patients: EntrepreneurPulse.com.au  Fact Sheet for Healthcare Providers: IncredibleEmployment.be  This test is not yet approved or cleared by the [place] FDA and has been authorized for detection and/or diagnosis of SARS-CoV-2 by FDA under an Emergency Use Authorization (EUA). This EUA will remain in effect (meaning this test can be used) for the duration of the COVID-19 declaration under Section 564(b)(1) of the Act, 21 U.S.C. section 360bbb-3(b)(1), unless the authorization is terminated or revoked.     Resp Syncytial Virus by PCR NEGATIVE NEGATIVE Final    Comment: (NOTE) Fact Sheet for Patients: EntrepreneurPulse.com.au  Fact Sheet for Healthcare Providers: IncredibleEmployment.be  This test is not yet approved or cleared by the [place] FDA and has been authorized for detection and/or diagnosis of SARS-CoV-2 by FDA under an Emergency Use Authorization (EUA). This EUA will remain in effect (meaning this test can be used) for the duration of the COVID-19 declaration under Section 564(b)(1) of the Act, 21 U.S.C. section 360bbb-3(b)(1), unless the authorization is terminated or revoked.  Performed at [facility], [place] [address],  [place], [place] [address]          Radiology Studies: US RENAL  Result Date: 09/10/2022 CLINICAL DATA:  Acute kidney injury. EXAM: RENAL / URINARY TRACT ULTRASOUND COMPLETE COMPARISON:  None Available. FINDINGS: Right Kidney: Renal measurements: 9.1 x 4.6 x 4.9 cm = volume: 107 mL. Echogenicity within normal limits. No mass or hydronephrosis visualized. Left Kidney: Renal measurements: 10.3 x 5.7 x 4.0 cm = volume: 122 mL. Echogenicity within normal limits. No mass or hydronephrosis visualized. Bladder: Appears normal for degree of bladder distention. Other: None. IMPRESSION: Normal renal ultrasound. Electronically Signed   By: Ronney Asters M.D.   On: 09/10/2022 22:14   DG Chest Portable 1 View  Result Date: 09/10/2022 CLINICAL DATA:  Shortness of breath EXAM: PORTABLE CHEST 1 VIEW COMPARISON:  08/02/2022 x-ray and older.  CT angiogram 07/20/2022 FINDINGS: Underinflation. Enlarged cardiopericardial silhouette. Diffuse interstitial changes and other chronic  lung changes. Similar to previous. Question tiny right effusion. No pneumothorax. Overlapping cardiac leads. Apical pleural thickening on the right. IMPRESSION: Underinflation with interstitial changes, possibly chronic. Small right pleural effusion. Enlarged heart. Appearance is similar to previous Electronically Signed   By: Jill Side M.D.   On: 09/10/2022 18:13        Scheduled Meds:  aspirin EC  81 mg Oral Daily   atorvastatin  20 mg Oral QPM   donepezil  10 mg Oral QHS   finasteride  5 mg Oral QHS   heparin  5,000 Units Subcutaneous Q8H   ipratropium-albuterol  3 mL Nebulization Q6H   levETIRAcetam  250 mg Oral q AM   levETIRAcetam  500 mg Oral QHS   methylPREDNISolone (SOLU-MEDROL) injection  125 mg Intravenous Q12H   Followed by   Mansel Memo ON 09/12/2022] predniSONE  40 mg Oral Q breakfast   pantoprazole  40 mg Oral Daily   sacubitril-valsartan  1 tablet Oral QPM   sodium chloride flush  3 mL Intravenous Q12H   traZODone  200  mg Oral QHS   Continuous Infusions:  cefTRIAXone (ROCEPHIN)  IV Stopped (09/10/22 2110)     LOS: 1 day    Time spent: 35 minutes    Barb Merino, MD [facility] Pager [phone]

## 2022-09-11 NOTE — ED Notes (Signed)
Pt updated about room status, pt given specimen cup and instructed need for sputum sample

## 2022-09-11 NOTE — ED Notes (Signed)
ED TO INPATIENT HANDOFF REPORT  ED Nurse Name and Phone #: Vincente Liberty [phone]  S Name/Age/Gender Frank Meyer 82 y.o. male Room/Bed: 038C/038C  Code Status   Code Status: Full Code  Home/SNF/Other Home Patient oriented to: self, place, time, and situation Is this baseline? Yes   Triage Complete: Triage complete  Chief Complaint Acute and chronic respiratory failure with hypoxia ([place]) [J96.21]  Triage Note Pt from home with reports of [place]. Pt on 2L chronically. Pt stating his oxygen has been low today. Pt has oxygen on 4L with sat of 75%. Pt with cyanotic lips.    Allergies No Known Allergies  Level of Care/Admitting Diagnosis ED Disposition     ED Disposition  Admit   Condition  --   Comment  Hospital Area: [facility] [[id number]]  Level of Care: Telemetry Medical [104]  May admit patient to [facility] or [facility] if equivalent level of care is available:: Yes  Covid Evaluation: Confirmed COVID Negative  Diagnosis: Acute and chronic respiratory failure with hypoxia [facility]) [[id number]]  Admitting Physician: Vianne Bulls [[id number]]  Attending Physician: Vianne Bulls [[id number]]  Certification:: I certify this patient will need inpatient services for at least 2 midnights  Estimated Length of Stay: 3          B Medical/Surgery History Past Medical History:  Diagnosis Date   Allergic rhinitis 10/24/2009   Altered bowel habits 10/04/2020   Basal cell carcinoma    Benign prostatic hyperplasia with lower urinary tract symptoms 10/24/2009   Benign prostatic hypertrophy    Bradycardia 12/06/2019   Callosity 10/16/2013   Cardiomegaly 01/23/2015   Chronic diastolic heart failure ([place]) 12/19/2018   Chronic respiratory failure ([place]) 10/04/2020   Coronary artery calcification seen on CAT scan 05/28/2020   Dyslipidemia    Dyspnea on exertion 03/18/2018   Emphysema lung ([place]) 03/18/2018   Encounter for general adult medical examination without  abnormal findings 01/17/2015   Ex-cigarette smoker 01/20/2021   Exudative age-related macular degeneration of left eye with active choroidal neovascularization ([place]) 12/18/2019   Gallbladder problem    Gastroesophageal reflux disease    Hiatal hernia    Hilar adenopathy 03/25/2018   History of smoking 03/18/2018   Hyperlipidemia 10/24/2009   Insomnia 05/09/2013   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   Internal hemorrhoid 10/04/2020   Macular degeneration    Macular pucker, right eye 04/30/2020   Mediastinal adenopathy 03/25/2018   Memory loss 05/09/2013   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   Mild memory disturbance    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   Noninfective gastroenteritis and colitis, unspecified 10/04/2020   Nuclear sclerotic cataract of right eye 12/18/2019   Other specified symptoms and signs involving the digestive system and abdomen 10/04/2020   Overweight 09/14/2018   Oxygen dependent 10/04/2020   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   Personal history of colonic polyps 10/04/2020   Posterior vitreous detachment of right eye 12/18/2019   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   Pulmonary hypertension ([place])    Right upper lobe pulmonary nodule 03/25/2018   Syncope and collapse 12/12/2018   TIA (transient ischemic attack)    Transient ischemic attack 10/24/2009   Weight decreased 10/04/2020   Past Surgical History:  Procedure Laterality Date   CHOLECYSTECTOMY     none       A IV Location/Drains/Wounds Patient Lines/Drains/Airways Status     Active Line/Drains/Airways     Name Placement  date Placement time Site Days   Peripheral IV 09/10/22 18 G Anterior;Right Forearm 09/10/22  1744  Forearm  1   Peripheral IV 09/10/22 20 G Anterior;Left Forearm 09/10/22  1752  Forearm  1            Intake/Output Last 24 hours  Intake/Output Summary (Last 24 hours) at  09/11/2022 0900 Last data filed at 09/10/2022 2110 Gross per 24 hour  Intake 2150 ml  Output --  Net 2150 ml    Labs/Imaging Results for orders placed or performed during the hospital encounter of 09/10/22 (from the past 48 hour(s))  Brain natriuretic peptide     Status: Abnormal   Collection Time: 09/10/22  5:43 PM  Result Value Ref Range   B Natriuretic Peptide 254.8 (H) 0.0 - 100.0 pg/mL    Comment: Performed at [facility], [address], [place], [place] [address]  Comprehensive metabolic panel     Status: Abnormal   Collection Time: 09/10/22  5:43 PM  Result Value Ref Range   Sodium 136 135 - 145 mmol/L   Potassium 4.1 3.5 - 5.1 mmol/L   Chloride 104 98 - 111 mmol/L   CO2 19 (L) 22 - 32 mmol/L   Glucose, Bld 137 (H) 70 - 99 mg/dL    Comment: Glucose reference range applies only to samples taken after fasting for at least 8 hours.   BUN 19 8 - 23 mg/dL   Creatinine, Ser 1.65 (H) 0.61 - 1.24 mg/dL   Calcium 8.4 (L) 8.9 - 10.3 mg/dL   Total Protein 5.9 (L) 6.5 - 8.1 g/dL   Albumin 2.8 (L) 3.5 - 5.0 g/dL   AST 23 15 - 41 U/L   ALT 20 0 - 44 U/L   Alkaline Phosphatase 44 38 - 126 U/L   Total Bilirubin 1.0 0.3 - 1.2 mg/dL   GFR, Estimated 41 (L) >60 mL/min    Comment: (NOTE) Calculated using the CKD-EPI Creatinine Equation (2021)    Anion gap 13 5 - 15    Comment: Performed at [facility], [place] [address] [place], [place] [address]  CBC with Differential     Status: Abnormal   Collection Time: 09/10/22  5:43 PM  Result Value Ref Range   WBC 14.4 (H) 4.0 - 10.5 K/uL   RBC 4.56 4.22 - 5.81 MIL/uL   Hemoglobin 12.4 (L) 13.0 - 17.0 g/dL   HCT 40.1 39.0 - 52.0 %   MCV 87.9 80.0 - 100.0 fL   MCH 27.2 26.0 - 34.0 pg   MCHC 30.9 30.0 - 36.0 g/dL   RDW 16.6 (H) 11.5 - 15.5 %   Platelets 267 150 - 400 K/uL   nRBC 0.0 0.0 - 0.2 %   Neutrophils Relative % 51 %   Neutro Abs 7.3 1.7 - 7.7 K/uL   Lymphocytes Relative 35 %   Lymphs Abs 5.0 (H) 0.7 - 4.0 K/uL    Monocytes Relative 10 %   Monocytes Absolute 1.4 (H) 0.1 - 1.0 K/uL   Eosinophils Relative 2 %   Eosinophils Absolute 0.3 0.0 - 0.5 K/uL   Basophils Relative 1 %   Basophils Absolute 0.1 0.0 - 0.1 K/uL   Immature Granulocytes 1 %   Abs Immature Granulocytes 0.16 (H) 0.00 - 0.07 K/uL    Comment: Performed at [facility] [address] [place], [place] [address]  Troponin I (High Sensitivity)     Status: Abnormal   Collection Time:  09/10/22  5:43 PM  Result Value Ref Range   Troponin I (High Sensitivity) 20 (H) <18 ng/L    Comment: (NOTE) Elevated high sensitivity troponin I (hsTnI) values and significant  changes across serial measurements may suggest ACS but many other  chronic and acute conditions are known to elevate hsTnI results.  Refer to the "Links" section for chest pain algorithms and additional  guidance. Performed at [facility], [place] [address] [place], [place] [address]   D-dimer, quantitative     Status: Abnormal   Collection Time: 09/10/22  5:43 PM  Result Value Ref Range   D-Dimer, Quant 0.55 (H) 0.00 - 0.50 ug/mL-FEU    Comment: (NOTE) At the manufacturer cut-off value of 0.5 g/mL FEU, this assay has a negative predictive value of 95-100%.This assay is intended for use in conjunction with a clinical pretest probability (PTP) assessment model to exclude pulmonary embolism (PE) and deep venous thrombosis (DVT) in outpatients suspected of PE or DVT. Results should be correlated with clinical presentation. Performed at [facility], [place] [address] [place], [place] [address]   Lactic acid, plasma     Status: Abnormal   Collection Time: 09/10/22  5:59 PM  Result Value Ref Range   Lactic Acid, Venous 3.6 (HH) 0.5 - 1.9 mmol/L    Comment: CRITICAL RESULT CALLED TO, READ BACK BY AND VERIFIED WITH D.GUILBEAULT,RN '@[id number]'$  09/10/2022 [email or web address] Performed at [facility], [place] [address] [place], [place] [address]   Resp panel by RT-PCR (RSV, Flu  A&B, Covid) Anterior Nasal Swab     Status: None   Collection Time: 09/10/22  6:08 PM   Specimen: Anterior Nasal Swab  Result Value Ref Range   SARS Coronavirus 2 by RT PCR NEGATIVE NEGATIVE    Comment: (NOTE) SARS-CoV-2 target nucleic acids are NOT DETECTED.  The SARS-CoV-2 RNA is generally detectable in upper respiratory specimens during the acute phase of infection. The lowest concentration of SARS-CoV-2 viral copies this assay can detect is 138 copies/mL. A negative result does not preclude SARS-Cov-2 infection and should not be used as the sole basis for treatment or other patient management decisions. A negative result may occur with  improper specimen collection/handling, submission of specimen other than nasopharyngeal swab, presence of viral mutation(s) within the areas targeted by this assay, and inadequate number of viral copies(<138 copies/mL). A negative result must be combined with clinical observations, patient history, and epidemiological information. The expected result is Negative.  Fact Sheet for Patients:  EntrepreneurPulse.com.au  Fact Sheet for Healthcare Providers:  IncredibleEmployment.be  This test is no t yet approved or cleared by the [place] FDA and  has been authorized for detection and/or diagnosis of SARS-CoV-2 by FDA under an Emergency Use Authorization (EUA). This EUA will remain  in effect (meaning this test can be used) for the duration of the COVID-19 declaration under Section 564(b)(1) of the Act, 21 U.S.C.section 360bbb-3(b)(1), unless the authorization is terminated  or revoked sooner.       Influenza A by PCR NEGATIVE NEGATIVE   Influenza B by PCR NEGATIVE NEGATIVE    Comment: (NOTE) The Xpert Xpress SARS-CoV-2/FLU/RSV plus assay is intended as an aid in the diagnosis of influenza from Nasopharyngeal swab specimens and should not be used as a sole basis for treatment. Nasal washings  and aspirates are unacceptable for Xpert Xpress SARS-CoV-2/FLU/RSV testing.  Fact Sheet for Patients: EntrepreneurPulse.com.au  Fact Sheet for Healthcare Providers: IncredibleEmployment.be  This test is not yet  approved or cleared by the [place] and has been authorized for detection and/or diagnosis of SARS-CoV-2 by FDA under an Emergency Use Authorization (EUA). This EUA will remain in effect (meaning this test can be used) for the duration of the COVID-19 declaration under Section 564(b)(1) of the Act, 21 U.S.C. section 360bbb-3(b)(1), unless the authorization is terminated or revoked.     Resp Syncytial Virus by PCR NEGATIVE NEGATIVE    Comment: (NOTE) Fact Sheet for Patients: EntrepreneurPulse.com.au  Fact Sheet for Healthcare Providers: IncredibleEmployment.be  This test is not yet approved or cleared by the [place] FDA and has been authorized for detection and/or diagnosis of SARS-CoV-2 by FDA under an Emergency Use Authorization (EUA). This EUA will remain in effect (meaning this test can be used) for the duration of the COVID-19 declaration under Section 564(b)(1) of the Act, 21 U.S.C. section 360bbb-3(b)(1), unless the authorization is terminated or revoked.  Performed at [facility], [place] [address], [place], [place] [address]   Troponin I (High Sensitivity)     Status: None   Collection Time: 09/10/22  7:53 PM  Result Value Ref Range   Troponin I (High Sensitivity) 12 <18 ng/L    Comment: (NOTE) Elevated high sensitivity troponin I (hsTnI) values and significant  changes across serial measurements may suggest ACS but many other  chronic and acute conditions are known to elevate hsTnI results.  Refer to the "Links" section for chest pain algorithms and additional  guidance. Performed at [facility], [place] [address], [place], [place] [address]   Lactic acid,  plasma     Status: None   Collection Time: 09/10/22  8:06 PM  Result Value Ref Range   Lactic Acid, Venous 1.5 0.5 - 1.9 mmol/L    Comment: Performed at [facility] [address] [place], [place] [address]  Basic metabolic panel     Status: Abnormal   Collection Time: 09/11/22  2:09 AM  Result Value Ref Range   Sodium 138 135 - 145 mmol/L   Potassium 3.9 3.5 - 5.1 mmol/L   Chloride 106 98 - 111 mmol/L   CO2 21 (L) 22 - 32 mmol/L   Glucose, Bld 234 (H) 70 - 99 mg/dL    Comment: Glucose reference range applies only to samples taken after fasting for at least 8 hours.   BUN 16 8 - 23 mg/dL   Creatinine, Ser 1.19 0.61 - 1.24 mg/dL   Calcium 7.8 (L) 8.9 - 10.3 mg/dL   GFR, Estimated >60 >60 mL/min    Comment: (NOTE) Calculated using the CKD-EPI Creatinine Equation (2021)    Anion gap 11 5 - 15    Comment: Performed at [facility] [address], [place], [place] [address]  CBC     Status: Abnormal   Collection Time: 09/11/22  2:09 AM  Result Value Ref Range   WBC 7.0 4.0 - 10.5 K/uL   RBC 4.04 (L) 4.22 - 5.81 MIL/uL   Hemoglobin 11.3 (L) 13.0 - 17.0 g/dL   HCT 35.3 (L) 39.0 - 52.0 %   MCV 87.4 80.0 - 100.0 fL   MCH 28.0 26.0 - 34.0 pg   MCHC 32.0 30.0 - 36.0 g/dL   RDW 16.3 (H) 11.5 - 15.5 %   Platelets 222 150 - 400 K/uL   nRBC 0.0 0.0 - 0.2 %    Comment: Performed at [facility], [place] [address], [place], [place] [address]  Urinalysis, Complete w Microscopic -Urine, Clean Catch     Status: Abnormal   Collection Time: 09/11/22  6:27 AM  Result Value Ref Range   Color, Urine YELLOW YELLOW   APPearance CLEAR CLEAR   Specific Gravity, Urine 1.010 1.005 - 1.030   pH 5.0 5.0 - 8.0   Glucose, UA NEGATIVE NEGATIVE mg/dL   Hgb urine dipstick NEGATIVE NEGATIVE   Bilirubin Urine NEGATIVE NEGATIVE   Ketones, ur NEGATIVE NEGATIVE mg/dL   Protein, ur NEGATIVE NEGATIVE mg/dL   Nitrite NEGATIVE NEGATIVE   Leukocytes,Ua NEGATIVE NEGATIVE   RBC / HPF 0-5 0 - 5  RBC/hpf   WBC, UA 0-5 0 - 5 WBC/hpf   Bacteria, UA RARE (A) NONE SEEN   Squamous Epithelial / HPF 0-5 0 - 5 /HPF   Mucus PRESENT    Hyaline Casts, UA PRESENT     Comment: Performed at [facility], [address] [place], [place] [address]  Sodium, urine, random     Status: None   Collection Time: 09/11/22  6:27 AM  Result Value Ref Range   Sodium, Ur 41 mmol/L    Comment: Performed at [facility] [address] [place], [place] [address]  Creatinine, urine, random     Status: None   Collection Time: 09/11/22  6:27 AM  Result Value Ref Range   Creatinine, Urine 67 mg/dL    Comment: Performed at [facility] [address], [place], [place] [address]   US RENAL  Result Date: 09/10/2022 CLINICAL DATA:  Acute kidney injury. EXAM: RENAL / URINARY TRACT ULTRASOUND COMPLETE COMPARISON:  None Available. FINDINGS: Right Kidney: Renal measurements: 9.1 x 4.6 x 4.9 cm = volume: 107 mL. Echogenicity within normal limits. No mass or hydronephrosis visualized. Left Kidney: Renal measurements: 10.3 x 5.7 x 4.0 cm = volume: 122 mL. Echogenicity within normal limits. No mass or hydronephrosis visualized. Bladder: Appears normal for degree of bladder distention. Other: None. IMPRESSION: Normal renal ultrasound. Electronically Signed   By: Ronney Asters M.D.   On: 09/10/2022 22:14   DG Chest Portable 1 View  Result Date: 09/10/2022 CLINICAL DATA:  Shortness of breath EXAM: PORTABLE CHEST 1 VIEW COMPARISON:  08/02/2022 x-ray and older.  CT angiogram 07/20/2022 FINDINGS: Underinflation. Enlarged cardiopericardial silhouette. Diffuse interstitial changes and other chronic lung changes. Similar to previous. Question tiny right effusion. No pneumothorax. Overlapping cardiac leads. Apical pleural thickening on the right. IMPRESSION: Underinflation with interstitial changes, possibly chronic. Small right pleural effusion. Enlarged heart. Appearance is similar to previous Electronically Signed    By: Jill Side M.D.   On: 09/10/2022 18:13    Pending Labs Unresulted Labs (From admission, onward)     Start     Ordered   09/11/22 2671  Basic metabolic panel  Daily,   R      09/10/22 2037   09/11/22 0500  CBC  Daily,   R      09/10/22 2037   09/10/22 2035  Expectorated Sputum Assessment w Gram Stain, Rflx to Resp Cult  (COPD / Pneumonia / Cellulitis / Lower Extremity Wound)  Once,   R        09/10/22 2037   09/10/22 1759  Blood culture (routine x 2)  BLOOD CULTURE X 2,   R (with STAT occurrences)      09/10/22 1758            Vitals/Pain Today's Vitals   09/11/22 0400 09/11/22 0500 09/11/22 0600  09/11/22 0813  BP: (!) 105/58 (!) 90/56 (!) 102/56 112/68  Pulse: 65 (!) 59 62 77  Resp: '16 14 13 '$ (!) 22  Temp:    97.9 F (36.6 C)  TempSrc:    Oral  SpO2: 96% 96% 97% 94%  Weight:      Height:      PainSc:        Isolation Precautions No active isolations  Medications Medications  aspirin EC tablet 81 mg (has no administration in time range)  atorvastatin (LIPITOR) tablet 20 mg (has no administration in time range)  sacubitril-valsartan (ENTRESTO) 24-26 mg per tablet (has no administration in time range)  donepezil (ARICEPT) tablet 10 mg (10 mg Oral Given 09/10/22 2257)  traZODone (DESYREL) tablet 200 mg (200 mg Oral Given 09/10/22 2257)  pantoprazole (PROTONIX) EC tablet 40 mg (has no administration in time range)  finasteride (PROSCAR) tablet 5 mg (5 mg Oral Given 09/10/22 2259)  heparin injection 5,000 Units (5,000 Units Subcutaneous Given 09/11/22 0622)  cefTRIAXone (ROCEPHIN) 1 g in sodium chloride 0.9 % 100 mL IVPB (0 g Intravenous Stopped 09/10/22 2110)  methylPREDNISolone sodium succinate (SOLU-MEDROL) 125 mg/2 mL injection 125 mg (125 mg Intravenous Given 09/11/22 0622)    Followed by  predniSONE (DELTASONE) tablet 40 mg (has no administration in time range)  ipratropium-albuterol (DUONEB) 0.5-2.5 (3) MG/3ML nebulizer solution 3 mL (3 mLs Nebulization Given  09/11/22 0819)  albuterol (PROVENTIL) (2.5 MG/3ML) 0.083% nebulizer solution 2.5 mg (has no administration in time range)  sodium chloride flush (NS) 0.9 % injection 3 mL (3 mLs Intravenous Given 09/10/22 2230)  acetaminophen (TYLENOL) tablet 650 mg (has no administration in time range)    Or  acetaminophen (TYLENOL) suppository 650 mg (has no administration in time range)  senna-docusate (Senokot-S) tablet 1 tablet (has no administration in time range)  0.9 %  sodium chloride infusion ( Intravenous New Bag/Given 09/11/22 0621)  levETIRAcetam (KEPPRA) tablet 250 mg (250 mg Oral Given 09/11/22 0819)  levETIRAcetam (KEPPRA) tablet 500 mg (500 mg Oral Given 09/10/22 2257)  sodium chloride 0.9 % bolus 1,000 mL (0 mLs Intravenous Stopped 09/10/22 1839)  albuterol (PROVENTIL) (2.5 MG/3ML) 0.083% nebulizer solution 10 mg (10 mg Nebulization Given 09/10/22 1820)  ipratropium (ATROVENT) nebulizer solution 0.5 mg (0.5 mg Nebulization Given 09/10/22 1821)  magnesium sulfate IVPB 2 g 50 mL (0 g Intravenous Stopped 09/10/22 1917)  methylPREDNISolone sodium succinate (SOLU-MEDROL) 125 mg/2 mL injection 125 mg (125 mg Intravenous Given 09/10/22 1812)  lactated ringers bolus 1,000 mL (0 mLs Intravenous Stopped 09/10/22 2034)    Mobility walks with device     Focused Assessments     R Recommendations: See Admitting Provider Note  Report given to:   Additional Notes:  Pt is on HFNC @ 10L, diminished lungs desats with ambulation uses urinal

## 2022-09-11 NOTE — ED Notes (Signed)
Family updated as to patient's status.

## 2022-09-11 NOTE — ED Notes (Signed)
ED TO INPATIENT HANDOFF REPORT  ED Nurse Name and Phone #: Vincente Liberty [phone]  S Name/Age/Gender Frank Meyer 82 y.o. male Room/Bed: 038C/038C  Code Status   Code Status: Full Code  Home/SNF/Other Home Patient oriented to: self, place, time, and situation Is this baseline? Yes   Triage Complete: Triage complete  Chief Complaint Acute and chronic respiratory failure with hypoxia ([place]) [J96.21] COPD with acute exacerbation ([place]) [J44.1]  Triage Note Pt from home with reports of [place]. Pt on 2L chronically. Pt stating his oxygen has been low today. Pt has oxygen on 4L with sat of 75%. Pt with cyanotic lips.    Allergies No Known Allergies  Level of Care/Admitting Diagnosis ED Disposition     ED Disposition  Admit   Condition  --   [place]: [facility] [[id number]]  Level of Care: Progressive [102]  Admit to Progressive based on following criteria: RESPIRATORY PROBLEMS hypoxemic/hypercapnic respiratory failure that is responsive to NIPPV (BiPAP) or High Flow Nasal Cannula (6-80 lpm). Frequent assessment/intervention, no > Q2 hrs < Q4 hrs, to maintain oxygenation and pulmonary hygiene.  May admit patient to [facility] or [facility] if equivalent level of care is available:: No  Covid Evaluation: Confirmed COVID Negative  Diagnosis: COPD with acute exacerbation [facility]) [[id number]]  Admitting Physician: Barb Merino [[id number]]  Attending Physician: Barb Merino [[id number]]  Certification:: I certify this patient will need inpatient services for at least 2 midnights          B Medical/Surgery History Past Medical History:  Diagnosis Date   Allergic rhinitis 10/24/2009   Altered bowel habits 10/04/2020   Basal cell carcinoma    Benign prostatic hyperplasia with lower urinary tract symptoms 10/24/2009   Benign prostatic hypertrophy    Bradycardia 12/06/2019   Callosity 10/16/2013   Cardiomegaly 01/23/2015   Chronic diastolic heart  failure ([place]) 12/19/2018   Chronic respiratory failure ([place]) 10/04/2020   Coronary artery calcification seen on CAT scan 05/28/2020   Dyslipidemia    Dyspnea on exertion 03/18/2018   Emphysema lung ([place]) 03/18/2018   Encounter for general adult medical examination without abnormal findings 01/17/2015   Ex-cigarette smoker 01/20/2021   Exudative age-related macular degeneration of left eye with active choroidal neovascularization ([place]) 12/18/2019   Gallbladder problem    Gastroesophageal reflux disease    Hiatal hernia    Hilar adenopathy 03/25/2018   History of smoking 03/18/2018   Hyperlipidemia 10/24/2009   Insomnia 05/09/2013   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   Internal hemorrhoid 10/04/2020   Macular degeneration    Macular pucker, right eye 04/30/2020   Mediastinal adenopathy 03/25/2018   Memory loss 05/09/2013   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   Mild memory disturbance    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   Noninfective gastroenteritis and colitis, unspecified 10/04/2020   Nuclear sclerotic cataract of right eye 12/18/2019   Other specified symptoms and signs involving the digestive system and abdomen 10/04/2020   Overweight 09/14/2018   Oxygen dependent 10/04/2020   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   Personal history of colonic polyps 10/04/2020   Posterior vitreous detachment of right eye 12/18/2019   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   Pulmonary hypertension ([place])    Right upper lobe pulmonary nodule 03/25/2018   Syncope and collapse 12/12/2018   TIA (transient ischemic attack)    Transient ischemic attack 10/24/2009   Weight decreased 10/04/2020   Past Surgical History:  Procedure Laterality Date   CHOLECYSTECTOMY     none       A IV Location/Drains/Wounds Patient Lines/Drains/Airways Status     Active Line/Drains/Airways     Name Placement date  Placement time Site Days   Peripheral IV 09/10/22 18 G Anterior;Right Forearm 09/10/22  1744  Forearm  1   Peripheral IV 09/10/22 20 G Anterior;Left Forearm 09/10/22  1752  Forearm  1            Intake/Output Last 24 hours  Intake/Output Summary (Last 24 hours) at 09/11/2022 1506 Last data filed at 09/11/2022 1128 Gross per 24 hour  Intake 2150 ml  Output 500 ml  Net 1650 ml    Labs/Imaging Results for orders placed or performed during the hospital encounter of 09/10/22 (from the past 48 hour(s))  Brain natriuretic peptide     Status: Abnormal   Collection Time: 09/10/22  5:43 PM  Result Value Ref Range   B Natriuretic Peptide 254.8 (H) 0.0 - 100.0 pg/mL    Comment: Performed at [facility], [address], [place], [place] [address]  Comprehensive metabolic panel     Status: Abnormal   Collection Time: 09/10/22  5:43 PM  Result Value Ref Range   Sodium 136 135 - 145 mmol/L   Potassium 4.1 3.5 - 5.1 mmol/L   Chloride 104 98 - 111 mmol/L   CO2 19 (L) 22 - 32 mmol/L   Glucose, Bld 137 (H) 70 - 99 mg/dL    Comment: Glucose reference range applies only to samples taken after fasting for at least 8 hours.   BUN 19 8 - 23 mg/dL   Creatinine, Ser 1.65 (H) 0.61 - 1.24 mg/dL   Calcium 8.4 (L) 8.9 - 10.3 mg/dL   Total Protein 5.9 (L) 6.5 - 8.1 g/dL   Albumin 2.8 (L) 3.5 - 5.0 g/dL   AST 23 15 - 41 U/L   ALT 20 0 - 44 U/L   Alkaline Phosphatase 44 38 - 126 U/L   Total Bilirubin 1.0 0.3 - 1.2 mg/dL   GFR, Estimated 41 (L) >60 mL/min    Comment: (NOTE) Calculated using the CKD-EPI Creatinine Equation (2021)    Anion gap 13 5 - 15    Comment: Performed at [facility], [place] [address] [place],  [address]  CBC with Differential     Status: Abnormal   Collection Time: 09/10/22  5:43 PM  Result Value Ref Range   WBC 14.4 (H) 4.0 - 10.5 K/uL   RBC 4.56 4.22 - 5.81 MIL/uL   Hemoglobin 12.4 (L) 13.0 - 17.0 g/dL   HCT 40.1 39.0 - 52.0 %   MCV 87.9 80.0 - 100.0  fL   MCH 27.2 26.0 - 34.0 pg   MCHC 30.9 30.0 - 36.0 g/dL   RDW 16.6 (H) 11.5 - 15.5 %   Platelets 267 150 - 400 K/uL   nRBC 0.0 0.0 - 0.2 %   Neutrophils Relative % 51 %   Neutro Abs 7.3 1.7 - 7.7 K/uL   Lymphocytes Relative 35 %   Lymphs Abs 5.0 (H) 0.7 - 4.0 K/uL   Monocytes Relative 10 %   Monocytes Absolute 1.4 (H) 0.1 - 1.0 K/uL   Eosinophils Relative 2 %   Eosinophils Absolute 0.3 0.0 - 0.5 K/uL   Basophils Relative 1 %   Basophils Absolute 0.1 0.0 - 0.1 K/uL   Immature Granulocytes 1 %   Abs Immature Granulocytes 0.16 (H)  0.00 - 0.07 K/uL    Comment: Performed at [facility], [place] [address], [place], [place] [address]  Troponin I (High Sensitivity)     Status: Abnormal   Collection Time: 09/10/22  5:43 PM  Result Value Ref Range   Troponin I (High Sensitivity) 20 (H) <18 ng/L    Comment: (NOTE) Elevated high sensitivity troponin I (hsTnI) values and significant  changes across serial measurements may suggest ACS but many other  chronic and acute conditions are known to elevate hsTnI results.  Refer to the "Links" section for chest pain algorithms and additional  guidance. Performed at [facility], [place] [address], [place], [place] [address]   D-dimer, quantitative     Status: Abnormal   Collection Time: 09/10/22  5:43 PM  Result Value Ref Range   D-Dimer, Quant 0.55 (H) 0.00 - 0.50 ug/mL-FEU    Comment: (NOTE) At the manufacturer cut-off value of 0.5 g/mL FEU, this assay has a negative predictive value of 95-100%.This assay is intended for use in conjunction with a clinical pretest probability (PTP) assessment model to exclude pulmonary embolism (PE) and deep venous thrombosis (DVT) in outpatients suspected of PE or DVT. Results should be correlated with clinical presentation. Performed at [facility], [place] [address], [place], [place] [address]   Lactic acid, plasma     Status: Abnormal   Collection Time: 09/10/22  5:59 PM  Result Value Ref  Range   Lactic Acid, Venous 3.6 (HH) 0.5 - 1.9 mmol/L    Comment: CRITICAL RESULT CALLED TO, READ BACK BY AND VERIFIED WITH D.GUILBEAULT,RN '@[id number]'$  09/10/2022 [email or web address] Performed at [facility], [place] [address] [place], [place] [address]   Blood culture (routine x 2)     Status: None (Preliminary result)   Collection Time: 09/10/22  5:59 PM   Specimen: BLOOD RIGHT FOREARM  Result Value Ref Range   Specimen Description BLOOD RIGHT FOREARM    Special Requests      BOTTLES DRAWN AEROBIC AND ANAEROBIC Blood Culture adequate volume   Culture      NO GROWTH < 24 HOURS Performed at [facility], [place] [address] [place], [place] [address]    Report Status PENDING   Blood culture (routine x 2)     Status: None (Preliminary result)   Collection Time: 09/10/22  6:04 PM   Specimen: BLOOD LEFT FOREARM  Result Value Ref Range   Specimen Description BLOOD LEFT FOREARM    Special Requests      BOTTLES DRAWN AEROBIC AND ANAEROBIC Blood Culture adequate volume   Culture      NO GROWTH < 24 HOURS Performed at [facility], [place] [address] [place], [place] [address]    Report Status PENDING   Resp panel by RT-PCR (RSV, Flu A&B, Covid) Anterior Nasal Swab     Status: None   Collection Time: 09/10/22  6:08 PM   Specimen: Anterior Nasal Swab  Result Value Ref Range   SARS Coronavirus 2 by RT PCR NEGATIVE NEGATIVE    Comment: (NOTE) SARS-CoV-2 target nucleic acids are NOT DETECTED.  The SARS-CoV-2 RNA is generally detectable in upper respiratory specimens during the acute phase of infection. The lowest concentration of SARS-CoV-2 viral copies this assay can detect is 138 copies/mL. A negative result does not preclude SARS-Cov-2 infection and should not be used as the sole basis for treatment or other patient management decisions. A negative result may occur with  improper  specimen collection/handling, submission of specimen other than nasopharyngeal swab, presence of viral  mutation(s) within the areas targeted by this assay, and inadequate number of viral copies(<138 copies/mL). A negative result must be combined with clinical observations, patient history, and epidemiological information. The expected result is Negative.  Fact Sheet for Patients:  EntrepreneurPulse.com.au  Fact Sheet for Healthcare Providers:  IncredibleEmployment.be  This test is no t yet approved or cleared by the [place] FDA and  has been authorized for detection and/or diagnosis of SARS-CoV-2 by FDA under an Emergency Use Authorization (EUA). This EUA will remain  in effect (meaning this test can be used) for the duration of the COVID-19 declaration under Section 564(b)(1) of the Act, 21 U.S.C.section 360bbb-3(b)(1), unless the authorization is terminated  or revoked sooner.       Influenza A by PCR NEGATIVE NEGATIVE   Influenza B by PCR NEGATIVE NEGATIVE    Comment: (NOTE) The Xpert Xpress SARS-CoV-2/FLU/RSV plus assay is intended as an aid in the diagnosis of influenza from Nasopharyngeal swab specimens and should not be used as a sole basis for treatment. Nasal washings and aspirates are unacceptable for Xpert Xpress SARS-CoV-2/FLU/RSV testing.  Fact Sheet for Patients: EntrepreneurPulse.com.au  Fact Sheet for Healthcare Providers: IncredibleEmployment.be  This test is not yet approved or cleared by the [place] FDA and has been authorized for detection and/or diagnosis of SARS-CoV-2 by FDA under an Emergency Use Authorization (EUA). This EUA will remain in effect (meaning this test can be used) for the duration of the COVID-19 declaration under Section 564(b)(1) of the Act, 21 U.S.C. section 360bbb-3(b)(1), unless the authorization is terminated or revoked.     Resp Syncytial Virus by PCR NEGATIVE NEGATIVE    Comment: (NOTE) Fact Sheet for  Patients: EntrepreneurPulse.com.au  Fact Sheet for Healthcare Providers: IncredibleEmployment.be  This test is not yet approved or cleared by the [place] FDA and has been authorized for detection and/or diagnosis of SARS-CoV-2 by FDA under an Emergency Use Authorization (EUA). This EUA will remain in effect (meaning this test can be used) for the duration of the COVID-19 declaration under Section 564(b)(1) of the Act, 21 U.S.C. section 360bbb-3(b)(1), unless the authorization is terminated or revoked.  Performed at [facility], [place] [address] [place], [place] [address]   Troponin I (High Sensitivity)     Status: None   Collection Time: 09/10/22  7:53 PM  Result Value Ref Range   Troponin I (High Sensitivity) 12 <18 ng/L    Comment: (NOTE) Elevated high sensitivity troponin I (hsTnI) values and significant  changes across serial measurements may suggest ACS but many other  chronic and acute conditions are known to elevate hsTnI results.  Refer to the "Links" section for chest pain algorithms and additional  guidance. Performed at [facility], [place] [address], [place], [place] [address]   Lactic acid, plasma     Status: None   Collection Time: 09/10/22  8:06 PM  Result Value Ref Range   Lactic Acid, Venous 1.5 0.5 - 1.9 mmol/L    Comment: Performed at [facility] [address] [place], [place] [address]  Basic metabolic panel     Status: Abnormal   Collection Time: 09/11/22  2:09 AM  Result Value Ref Range   Sodium 138 135 - 145 mmol/L   Potassium 3.9 3.5 - 5.1 mmol/L   Chloride 106 98 - 111 mmol/L   CO2 21 (L) 22 - 32 mmol/L   Glucose,  Bld 234 (H) 70 - 99 mg/dL    Comment: Glucose reference range applies only to samples taken after fasting for at least 8 hours.   BUN 16 8 - 23 mg/dL   Creatinine, Ser 1.19 0.61 - 1.24 mg/dL   Calcium 7.8 (L) 8.9 - 10.3 mg/dL   GFR, Estimated >60 >60 mL/min    Comment:  (NOTE) Calculated using the CKD-EPI Creatinine Equation (2021)    Anion gap 11 5 - 15    Comment: Performed at [facility] [address], [place], [place] [address]  CBC     Status: Abnormal   Collection Time: 09/11/22  2:09 AM  Result Value Ref Range   WBC 7.0 4.0 - 10.5 K/uL   RBC 4.04 (L) 4.22 - 5.81 MIL/uL   Hemoglobin 11.3 (L) 13.0 - 17.0 g/dL   HCT 35.3 (L) 39.0 - 52.0 %   MCV 87.4 80.0 - 100.0 fL   MCH 28.0 26.0 - 34.0 pg   MCHC 32.0 30.0 - 36.0 g/dL   RDW 16.3 (H) 11.5 - 15.5 %   Platelets 222 150 - 400 K/uL   nRBC 0.0 0.0 - 0.2 %    Comment: Performed at [facility], [place] [address] [place], [place] [address]  Urinalysis, Complete w Microscopic -Urine, Clean Catch     Status: Abnormal   Collection Time: 09/11/22  6:27 AM  Result Value Ref Range   Color, Urine YELLOW YELLOW   APPearance CLEAR CLEAR   Specific Gravity, Urine 1.010 1.005 - 1.030   pH 5.0 5.0 - 8.0   Glucose, UA NEGATIVE NEGATIVE mg/dL   Hgb urine dipstick NEGATIVE NEGATIVE   Bilirubin Urine NEGATIVE NEGATIVE   Ketones, ur NEGATIVE NEGATIVE mg/dL   Protein, ur NEGATIVE NEGATIVE mg/dL   Nitrite NEGATIVE NEGATIVE   Leukocytes,Ua NEGATIVE NEGATIVE   RBC / HPF 0-5 0 - 5 RBC/hpf   WBC, UA 0-5 0 - 5 WBC/hpf   Bacteria, UA RARE (A) NONE SEEN   Squamous Epithelial / HPF 0-5 0 - 5 /HPF   Mucus PRESENT    Hyaline Casts, UA PRESENT     Comment: Performed at [facility] [address], [place], [place] [address]  Sodium, urine, random     Status: None   Collection Time: 09/11/22  6:27 AM  Result Value Ref Range   Sodium, Ur 41 mmol/L    Comment: Performed at [facility] [address], [place], [place] [address]  Creatinine, urine, random     Status: None   Collection Time: 09/11/22  6:27 AM  Result Value Ref Range   Creatinine, Urine 67 mg/dL    Comment: Performed at [facility] [address], [place], [place] [address]   US RENAL  Result Date:  09/10/2022 CLINICAL DATA:  Acute kidney injury. EXAM: RENAL / URINARY TRACT ULTRASOUND COMPLETE COMPARISON:  None Available. FINDINGS: Right Kidney: Renal measurements: 9.1 x 4.6 x 4.9 cm = volume: 107 mL. Echogenicity within normal limits. No mass or hydronephrosis visualized. Left Kidney: Renal measurements: 10.3 x 5.7 x 4.0 cm = volume: 122 mL. Echogenicity within normal limits. No mass or hydronephrosis visualized. Bladder: Appears normal for degree of bladder distention. Other: None. IMPRESSION: Normal renal ultrasound. Electronically Signed   By: Ronney Asters M.D.   On: 09/10/2022 22:14   DG Chest Portable 1 View  Result Date: 09/10/2022 CLINICAL DATA:  Shortness of breath EXAM: PORTABLE CHEST 1 VIEW  COMPARISON:  08/02/2022 x-ray and older.  CT angiogram 07/20/2022 FINDINGS: Underinflation. Enlarged cardiopericardial silhouette. Diffuse interstitial changes and other chronic lung changes. Similar to previous. Question tiny right effusion. No pneumothorax. Overlapping cardiac leads. Apical pleural thickening on the right. IMPRESSION: Underinflation with interstitial changes, possibly chronic. Small right pleural effusion. Enlarged heart. Appearance is similar to previous Electronically Signed   By: Jill Side M.D.   On: 09/10/2022 18:13    Pending Labs Unresulted Labs (From admission, onward)     Start     Ordered   09/11/22 8341  Basic metabolic panel  Daily,   R      09/10/22 2037   09/11/22 0500  CBC  Daily,   R      09/10/22 2037   09/10/22 2035  Expectorated Sputum Assessment w Gram Stain, Rflx to Resp Cult  (COPD / Pneumonia / Cellulitis / Lower Extremity Wound)  Once,   R        09/10/22 2037            Vitals/Pain Today's Vitals   09/11/22 1300 09/11/22 1404 09/11/22 1405 09/11/22 1408  BP: (!) 123/99 110/62 106/64 (!) 127/57  Pulse: 76 83 80 87  Resp: (!) 28     Temp:      TempSrc:      SpO2: 96% 94% 96% 92%  Weight:      Height:      PainSc:        Isolation  Precautions No active isolations  Medications Medications  aspirin EC tablet 81 mg (81 mg Oral Given 09/11/22 0951)  atorvastatin (LIPITOR) tablet 20 mg (has no administration in time range)  sacubitril-valsartan (ENTRESTO) 24-26 mg per tablet (has no administration in time range)  donepezil (ARICEPT) tablet 10 mg (10 mg Oral Given 09/10/22 2257)  traZODone (DESYREL) tablet 200 mg (200 mg Oral Given 09/10/22 2257)  pantoprazole (PROTONIX) EC tablet 40 mg (40 mg Oral Given 09/11/22 0951)  finasteride (PROSCAR) tablet 5 mg (5 mg Oral Given 09/10/22 2259)  heparin injection 5,000 Units (5,000 Units Subcutaneous Given 09/11/22 0622)  cefTRIAXone (ROCEPHIN) 1 g in sodium chloride 0.9 % 100 mL IVPB (0 g Intravenous Stopped 09/10/22 2110)  methylPREDNISolone sodium succinate (SOLU-MEDROL) 125 mg/2 mL injection 125 mg (125 mg Intravenous Given 09/11/22 0622)    Followed by  predniSONE (DELTASONE) tablet 40 mg (has no administration in time range)  ipratropium-albuterol (DUONEB) 0.5-2.5 (3) MG/3ML nebulizer solution 3 mL (3 mLs Nebulization Given 09/11/22 1346)  albuterol (PROVENTIL) (2.5 MG/3ML) 0.083% nebulizer solution 2.5 mg (has no administration in time range)  sodium chloride flush (NS) 0.9 % injection 3 mL (3 mLs Intravenous Not Given 09/11/22 0948)  acetaminophen (TYLENOL) tablet 650 mg (has no administration in time range)    Or  acetaminophen (TYLENOL) suppository 650 mg (has no administration in time range)  senna-docusate (Senokot-S) tablet 1 tablet (has no administration in time range)  0.9 %  sodium chloride infusion ( Intravenous New Bag/Given 09/11/22 0621)  levETIRAcetam (KEPPRA) tablet 250 mg (250 mg Oral Given 09/11/22 0819)  levETIRAcetam (KEPPRA) tablet 500 mg (500 mg Oral Given 09/10/22 2257)  sodium chloride 0.9 % bolus 1,000 mL (0 mLs Intravenous Stopped 09/10/22 1839)  albuterol (PROVENTIL) (2.5 MG/3ML) 0.083% nebulizer solution 10 mg (10 mg Nebulization Given 09/10/22 1820)   ipratropium (ATROVENT) nebulizer solution 0.5 mg (0.5 mg Nebulization Given 09/10/22 1821)  magnesium sulfate IVPB 2 g 50 mL (0 g Intravenous Stopped 09/10/22 1917)  methylPREDNISolone sodium  succinate (SOLU-MEDROL) 125 mg/2 mL injection 125 mg (125 mg Intravenous Given 09/10/22 1812)  lactated ringers bolus 1,000 mL (0 mLs Intravenous Stopped 09/10/22 2034)    Mobility Have not ambulated pt      Focused Assessments    R Recommendations: See Admitting Provider Note  Report given to:   Additional Notes: pt is on 7L HFNC (I have weaned him from 10L) he is tolerating well. He will desat with movement and talking on phone but he desat on 10L as well. He normally wears 5L at home. He is very pleasant.

## 2022-09-11 NOTE — ED Notes (Signed)
ED TO INPATIENT HANDOFF REPORT  ED Nurse Name and Phone #: Vincente Liberty [phone]  S Name/Age/Gender Frank Meyer 82 y.o. male Room/Bed: 038C/038C  Code Status   Code Status: Full Code  Home/SNF/Other Home Patient oriented to: self, place, time, and situation Is this baseline? Yes   Triage Complete: Triage complete  Chief Complaint Acute and chronic respiratory failure with hypoxia ([place]) [J96.21]  Triage Note Pt from home with reports of [place]. Pt on 2L chronically. Pt stating his oxygen has been low today. Pt has oxygen on 4L with sat of 75%. Pt with cyanotic lips.    Allergies No Known Allergies  Level of Care/Admitting Diagnosis ED Disposition     ED Disposition  Admit   Condition  --   Comment  Hospital Area: [facility] [[id number]]  Level of Care: Telemetry Medical [[id number]]  May admit patient to [facility] or [facility] if equivalent level of care is available:: Yes  Covid Evaluation: Confirmed COVID Negative  Diagnosis: Acute and chronic respiratory failure with hypoxia [facility]) [[id number]]  Admitting Physician: Vianne Bulls [[id number]]  Attending Physician: Vianne Bulls [[id number]]  Certification:: I certify this patient will need inpatient services for at least 2 midnights  Estimated Length of Stay: 3          B Medical/Surgery History Past Medical History:  Diagnosis Date   Allergic rhinitis 10/24/2009   Altered bowel habits 10/04/2020   Basal cell carcinoma    Benign prostatic hyperplasia with lower urinary tract symptoms 10/24/2009   Benign prostatic hypertrophy    Bradycardia 12/06/2019   Callosity 10/16/2013   Cardiomegaly 01/23/2015   Chronic diastolic heart failure ([place]) 12/19/2018   Chronic respiratory failure ([place]) 10/04/2020   Coronary artery calcification seen on CAT scan 05/28/2020   Dyslipidemia    Dyspnea on exertion 03/18/2018   Emphysema lung ([place]) 03/18/2018   Encounter for general adult medical examination without  abnormal findings 01/17/2015   Ex-cigarette smoker 01/20/2021   Exudative age-related macular degeneration of left eye with active choroidal neovascularization ([place]) 12/18/2019   Gallbladder problem    Gastroesophageal reflux disease    Hiatal hernia    Hilar adenopathy 03/25/2018   History of smoking 03/18/2018   Hyperlipidemia 10/24/2009   Insomnia 05/09/2013   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   Internal hemorrhoid 10/04/2020   Macular degeneration    Macular pucker, right eye 04/30/2020   Mediastinal adenopathy 03/25/2018   Memory loss 05/09/2013   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   Mild memory disturbance    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   Noninfective gastroenteritis and colitis, unspecified 10/04/2020   Nuclear sclerotic cataract of right eye 12/18/2019   Other specified symptoms and signs involving the digestive system and abdomen 10/04/2020   Overweight 09/14/2018   Oxygen dependent 10/04/2020   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   Personal history of colonic polyps 10/04/2020   Posterior vitreous detachment of right eye 12/18/2019   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   Pulmonary hypertension ([place])    Right upper lobe pulmonary nodule 03/25/2018   Syncope and collapse 12/12/2018   TIA (transient ischemic attack)    Transient ischemic attack 10/24/2009   Weight decreased 10/04/2020   Past Surgical History:  Procedure Laterality Date   CHOLECYSTECTOMY     none       A IV Location/Drains/Wounds Patient Lines/Drains/Airways Status     Active Line/Drains/Airways     Name Placement  date Placement time Site Days   Peripheral IV 09/10/22 18 G Anterior;Right Forearm 09/10/22  1744  Forearm  1   Peripheral IV 09/10/22 20 G Anterior;Left Forearm 09/10/22  1752  Forearm  1            Intake/Output Last 24 hours  Intake/Output Summary (Last 24 hours) at  09/11/2022 1145 Last data filed at 09/11/2022 1128 Gross per 24 hour  Intake 2150 ml  Output 500 ml  Net 1650 ml    Labs/Imaging Results for orders placed or performed during the hospital encounter of 09/10/22 (from the past 48 hour(s))  Brain natriuretic peptide     Status: Abnormal   Collection Time: 09/10/22  5:43 PM  Result Value Ref Range   B Natriuretic Peptide 254.8 (H) 0.0 - 100.0 pg/mL    Comment: Performed at [facility], [address], [place], [place] [address]  Comprehensive metabolic panel     Status: Abnormal   Collection Time: 09/10/22  5:43 PM  Result Value Ref Range   Sodium 136 135 - 145 mmol/L   Potassium 4.1 3.5 - 5.1 mmol/L   Chloride 104 98 - 111 mmol/L   CO2 19 (L) 22 - 32 mmol/L   Glucose, Bld 137 (H) 70 - 99 mg/dL    Comment: Glucose reference range applies only to samples taken after fasting for at least 8 hours.   BUN 19 8 - 23 mg/dL   Creatinine, Ser 1.65 (H) 0.61 - 1.24 mg/dL   Calcium 8.4 (L) 8.9 - 10.3 mg/dL   Total Protein 5.9 (L) 6.5 - 8.1 g/dL   Albumin 2.8 (L) 3.5 - 5.0 g/dL   AST 23 15 - 41 U/L   ALT 20 0 - 44 U/L   Alkaline Phosphatase 44 38 - 126 U/L   Total Bilirubin 1.0 0.3 - 1.2 mg/dL   GFR, Estimated 41 (L) >60 mL/min    Comment: (NOTE) Calculated using the CKD-EPI Creatinine Equation (2021)    Anion gap 13 5 - 15    Comment: Performed at [facility], [place] [address] [place], [place] [address]  CBC with Differential     Status: Abnormal   Collection Time: 09/10/22  5:43 PM  Result Value Ref Range   WBC 14.4 (H) 4.0 - 10.5 K/uL   RBC 4.56 4.22 - 5.81 MIL/uL   Hemoglobin 12.4 (L) 13.0 - 17.0 g/dL   HCT 40.1 39.0 - 52.0 %   MCV 87.9 80.0 - 100.0 fL   MCH 27.2 26.0 - 34.0 pg   MCHC 30.9 30.0 - 36.0 g/dL   RDW 16.6 (H) 11.5 - 15.5 %   Platelets 267 150 - 400 K/uL   nRBC 0.0 0.0 - 0.2 %   Neutrophils Relative % 51 %   Neutro Abs 7.3 1.7 - 7.7 K/uL   Lymphocytes Relative 35 %   Lymphs Abs 5.0 (H) 0.7 - 4.0 K/uL    Monocytes Relative 10 %   Monocytes Absolute 1.4 (H) 0.1 - 1.0 K/uL   Eosinophils Relative 2 %   Eosinophils Absolute 0.3 0.0 - 0.5 K/uL   Basophils Relative 1 %   Basophils Absolute 0.1 0.0 - 0.1 K/uL   Immature Granulocytes 1 %   Abs Immature Granulocytes 0.16 (H) 0.00 - 0.07 K/uL    Comment: Performed at [facility] [address], [place], [place] [address]  Troponin I (High Sensitivity)     Status: Abnormal   Collection  Time: 09/10/22  5:43 PM  Result Value Ref Range   Troponin I (High Sensitivity) 20 (H) <18 ng/L    Comment: (NOTE) Elevated high sensitivity troponin I (hsTnI) values and significant  changes across serial measurements may suggest ACS but many other  chronic and acute conditions are known to elevate hsTnI results.  Refer to the "Links" section for chest pain algorithms and additional  guidance. Performed at [facility], [place] [address], [place], [place] [address]   D-dimer, quantitative     Status: Abnormal   Collection Time: 09/10/22  5:43 PM  Result Value Ref Range   D-Dimer, Quant 0.55 (H) 0.00 - 0.50 ug/mL-FEU    Comment: (NOTE) At the manufacturer cut-off value of 0.5 g/mL FEU, this assay has a negative predictive value of 95-100%.This assay is intended for use in conjunction with a clinical pretest probability (PTP) assessment model to exclude pulmonary embolism (PE) and deep venous thrombosis (DVT) in outpatients suspected of PE or DVT. Results should be correlated with clinical presentation. Performed at [facility], [place] [address] [place], [place] [address]   Lactic acid, plasma     Status: Abnormal   Collection Time: 09/10/22  5:59 PM  Result Value Ref Range   Lactic Acid, Venous 3.6 (HH) 0.5 - 1.9 mmol/L    Comment: CRITICAL RESULT CALLED TO, READ BACK BY AND VERIFIED WITH D.GUILBEAULT,RN '@[id number]'$  09/10/2022 [email or web address] Performed at [facility], [place] [address], [place], [place] [address]   Blood culture (routine x 2)      Status: None (Preliminary result)   Collection Time: 09/10/22  5:59 PM   Specimen: BLOOD RIGHT FOREARM  Result Value Ref Range   Specimen Description BLOOD RIGHT FOREARM    Special Requests      BOTTLES DRAWN AEROBIC AND ANAEROBIC Blood Culture adequate volume   Culture      NO GROWTH < 24 HOURS Performed at [facility], [place] [address], [place], [place] [address]    Report Status PENDING   Blood culture (routine x 2)     Status: None (Preliminary result)   Collection Time: 09/10/22  6:04 PM   Specimen: BLOOD LEFT FOREARM  Result Value Ref Range   Specimen Description BLOOD LEFT FOREARM    Special Requests      BOTTLES DRAWN AEROBIC AND ANAEROBIC Blood Culture adequate volume   Culture      NO GROWTH < 24 HOURS Performed at [facility], [place] [address] [place], [place] [address]    Report Status PENDING   Resp panel by RT-PCR (RSV, Flu A&B, Covid) Anterior Nasal Swab     Status: None   Collection Time: 09/10/22  6:08 PM   Specimen: Anterior Nasal Swab  Result Value Ref Range   SARS Coronavirus 2 by RT PCR NEGATIVE NEGATIVE    Comment: (NOTE) SARS-CoV-2 target nucleic acids are NOT DETECTED.  The SARS-CoV-2 RNA is generally detectable in upper respiratory specimens during the acute phase of infection. The lowest concentration of SARS-CoV-2 viral copies this assay can detect is 138 copies/mL. A negative result does not preclude SARS-Cov-2 infection and should not be used as the sole basis for treatment or other patient management decisions. A negative result may occur with  improper specimen collection/handling, submission of specimen other than nasopharyngeal swab, presence of viral mutation(s) within the areas targeted by this assay, and inadequate number of viral copies(<138 copies/mL). A negative result must be combined with clinical  observations, patient history, and epidemiological information. The expected result is Negative.  Fact Sheet for Patients:   EntrepreneurPulse.com.au  Fact Sheet for Healthcare Providers:  IncredibleEmployment.be  This test is no t yet approved or cleared by the [place] FDA and  has been authorized for detection and/or diagnosis of SARS-CoV-2 by FDA under an Emergency Use Authorization (EUA). This EUA will remain  in effect (meaning this test can be used) for the duration of the COVID-19 declaration under Section 564(b)(1) of the Act, 21 U.S.C.section 360bbb-3(b)(1), unless the authorization is terminated  or revoked sooner.       Influenza A by PCR NEGATIVE NEGATIVE   Influenza B by PCR NEGATIVE NEGATIVE    Comment: (NOTE) The Xpert Xpress SARS-CoV-2/FLU/RSV plus assay is intended as an aid in the diagnosis of influenza from Nasopharyngeal swab specimens and should not be used as a sole basis for treatment. Nasal washings and aspirates are unacceptable for Xpert Xpress SARS-CoV-2/FLU/RSV testing.  Fact Sheet for Patients: EntrepreneurPulse.com.au  Fact Sheet for Healthcare Providers: IncredibleEmployment.be  This test is not yet approved or cleared by the [place] FDA and has been authorized for detection and/or diagnosis of SARS-CoV-2 by FDA under an Emergency Use Authorization (EUA). This EUA will remain in effect (meaning this test can be used) for the duration of the COVID-19 declaration under Section 564(b)(1) of the Act, 21 U.S.C. section 360bbb-3(b)(1), unless the authorization is terminated or revoked.     Resp Syncytial Virus by PCR NEGATIVE NEGATIVE    Comment: (NOTE) Fact Sheet for Patients: EntrepreneurPulse.com.au  Fact Sheet for Healthcare Providers: IncredibleEmployment.be  This test is not yet approved or cleared by the [place] FDA and has been authorized for detection and/or diagnosis of SARS-CoV-2 by FDA under an Emergency Use Authorization (EUA).  This EUA will remain in effect (meaning this test can be used) for the duration of the COVID-19 declaration under Section 564(b)(1) of the Act, 21 U.S.C. section 360bbb-3(b)(1), unless the authorization is terminated or revoked.  Performed at [facility], [place] [address], [place], [place] [address]   Troponin I (High Sensitivity)     Status: None   Collection Time: 09/10/22  7:53 PM  Result Value Ref Range   Troponin I (High Sensitivity) 12 <18 ng/L    Comment: (NOTE) Elevated high sensitivity troponin I (hsTnI) values and significant  changes across serial measurements may suggest ACS but many other  chronic and acute conditions are known to elevate hsTnI results.  Refer to the "Links" section for chest pain algorithms and additional  guidance. Performed at [facility], [place] [address], [place], [place] [address]   Lactic acid, plasma     Status: None   Collection Time: 09/10/22  8:06 PM  Result Value Ref Range   Lactic Acid, Venous 1.5 0.5 - 1.9 mmol/L    Comment: Performed at [facility] [address] [place], [place] [address]  Basic metabolic panel     Status: Abnormal   Collection Time: 09/11/22  2:09 AM  Result Value Ref Range   Sodium 138 135 - 145 mmol/L   Potassium 3.9 3.5 - 5.1 mmol/L   Chloride 106 98 - 111 mmol/L   CO2 21 (L) 22 - 32 mmol/L   Glucose, Bld 234 (H) 70 - 99 mg/dL    Comment: Glucose reference range applies only to samples taken after fasting for at least 8 hours.   BUN 16 8 - 23 mg/dL  Creatinine, Ser 1.19 0.61 - 1.24 mg/dL   Calcium 7.8 (L) 8.9 - 10.3 mg/dL   GFR, Estimated >60 >60 mL/min    Comment: (NOTE) Calculated using the CKD-EPI Creatinine Equation (2021)    Anion gap 11 5 - 15    Comment: Performed at [facility] [address] [place], [place] [address]  CBC     Status: Abnormal   Collection Time: 09/11/22  2:09 AM  Result Value Ref Range   WBC 7.0 4.0 - 10.5 K/uL   RBC 4.04 (L) 4.22 - 5.81 MIL/uL    Hemoglobin 11.3 (L) 13.0 - 17.0 g/dL   HCT 35.3 (L) 39.0 - 52.0 %   MCV 87.4 80.0 - 100.0 fL   MCH 28.0 26.0 - 34.0 pg   MCHC 32.0 30.0 - 36.0 g/dL   RDW 16.3 (H) 11.5 - 15.5 %   Platelets 222 150 - 400 K/uL   nRBC 0.0 0.0 - 0.2 %    Comment: Performed at [facility], [place] [address], [place], [place] [address]  Urinalysis, Complete w Microscopic -Urine, Clean Catch     Status: Abnormal   Collection Time: 09/11/22  6:27 AM  Result Value Ref Range   Color, Urine YELLOW YELLOW   APPearance CLEAR CLEAR   Specific Gravity, Urine 1.010 1.005 - 1.030   pH 5.0 5.0 - 8.0   Glucose, UA NEGATIVE NEGATIVE mg/dL   Hgb urine dipstick NEGATIVE NEGATIVE   Bilirubin Urine NEGATIVE NEGATIVE   Ketones, ur NEGATIVE NEGATIVE mg/dL   Protein, ur NEGATIVE NEGATIVE mg/dL   Nitrite NEGATIVE NEGATIVE   Leukocytes,Ua NEGATIVE NEGATIVE   RBC / HPF 0-5 0 - 5 RBC/hpf   WBC, UA 0-5 0 - 5 WBC/hpf   Bacteria, UA RARE (A) NONE SEEN   Squamous Epithelial / HPF 0-5 0 - 5 /HPF   Mucus PRESENT    Hyaline Casts, UA PRESENT     Comment: Performed at [facility] [address], [place], [place] [address]  Sodium, urine, random     Status: None   Collection Time: 09/11/22  6:27 AM  Result Value Ref Range   Sodium, Ur 41 mmol/L    Comment: Performed at [facility] [address] [place], [place] [address]  Creatinine, urine, random     Status: None   Collection Time: 09/11/22  6:27 AM  Result Value Ref Range   Creatinine, Urine 67 mg/dL    Comment: Performed at [facility] [address], [place], [place] [address]   US RENAL  Result Date: 09/10/2022 CLINICAL DATA:  Acute kidney injury. EXAM: RENAL / URINARY TRACT ULTRASOUND COMPLETE COMPARISON:  None Available. FINDINGS: Right Kidney: Renal measurements: 9.1 x 4.6 x 4.9 cm = volume: 107 mL. Echogenicity within normal limits. No mass or hydronephrosis visualized. Left Kidney: Renal measurements: 10.3 x 5.7 x 4.0 cm = volume: 122 mL.  Echogenicity within normal limits. No mass or hydronephrosis visualized. Bladder: Appears normal for degree of bladder distention. Other: None. IMPRESSION: Normal renal ultrasound. Electronically Signed   By: Ronney Asters M.D.   On: 09/10/2022 22:14   DG Chest Portable 1 View  Result Date: 09/10/2022 CLINICAL DATA:  Shortness of breath EXAM: PORTABLE CHEST 1 VIEW COMPARISON:  08/02/2022 x-ray and older.  CT angiogram 07/20/2022 FINDINGS: Underinflation. Enlarged cardiopericardial silhouette. Diffuse interstitial changes and other chronic lung changes. Similar to previous. Question tiny right effusion. No pneumothorax. Overlapping cardiac leads. Apical  pleural thickening on the right. IMPRESSION: Underinflation with interstitial changes, possibly chronic. Small right pleural effusion. Enlarged heart. Appearance is similar to previous Electronically Signed   By: Jill Side M.D.   On: 09/10/2022 18:13    Pending Labs Unresulted Labs (From admission, onward)     Start     Ordered   09/11/22 9242  Basic metabolic panel  Daily,   R      09/10/22 2037   09/11/22 0500  CBC  Daily,   R      09/10/22 2037   09/10/22 2035  Expectorated Sputum Assessment w Gram Stain, Rflx to Resp Cult  (COPD / Pneumonia / Cellulitis / Lower Extremity Wound)  Once,   R        09/10/22 2037            Vitals/Pain Today's Vitals   09/11/22 0500 09/11/22 0600 09/11/22 0813 09/11/22 1100  BP: (!) 90/56 (!) 102/56 112/68 109/66  Pulse: (!) 59 62 77 65  Resp: 14 13 (!) 22 16  Temp:   97.9 F (36.6 C)   TempSrc:   Oral   SpO2: 96% 97% 94% 95%  Weight:      Height:      PainSc:        Isolation Precautions No active isolations  Medications Medications  aspirin EC tablet 81 mg (81 mg Oral Given 09/11/22 0951)  atorvastatin (LIPITOR) tablet 20 mg (has no administration in time range)  sacubitril-valsartan (ENTRESTO) 24-26 mg per tablet (has no administration in time range)  donepezil (ARICEPT) tablet 10 mg  (10 mg Oral Given 09/10/22 2257)  traZODone (DESYREL) tablet 200 mg (200 mg Oral Given 09/10/22 2257)  pantoprazole (PROTONIX) EC tablet 40 mg (40 mg Oral Given 09/11/22 0951)  finasteride (PROSCAR) tablet 5 mg (5 mg Oral Given 09/10/22 2259)  heparin injection 5,000 Units (5,000 Units Subcutaneous Given 09/11/22 0622)  cefTRIAXone (ROCEPHIN) 1 g in sodium chloride 0.9 % 100 mL IVPB (0 g Intravenous Stopped 09/10/22 2110)  methylPREDNISolone sodium succinate (SOLU-MEDROL) 125 mg/2 mL injection 125 mg (125 mg Intravenous Given 09/11/22 0622)    Followed by  predniSONE (DELTASONE) tablet 40 mg (has no administration in time range)  ipratropium-albuterol (DUONEB) 0.5-2.5 (3) MG/3ML nebulizer solution 3 mL (3 mLs Nebulization Given 09/11/22 0819)  albuterol (PROVENTIL) (2.5 MG/3ML) 0.083% nebulizer solution 2.5 mg (has no administration in time range)  sodium chloride flush (NS) 0.9 % injection 3 mL (3 mLs Intravenous Not Given 09/11/22 0948)  acetaminophen (TYLENOL) tablet 650 mg (has no administration in time range)    Or  acetaminophen (TYLENOL) suppository 650 mg (has no administration in time range)  senna-docusate (Senokot-S) tablet 1 tablet (has no administration in time range)  0.9 %  sodium chloride infusion ( Intravenous New Bag/Given 09/11/22 0621)  levETIRAcetam (KEPPRA) tablet 250 mg (250 mg Oral Given 09/11/22 0819)  levETIRAcetam (KEPPRA) tablet 500 mg (500 mg Oral Given 09/10/22 2257)  sodium chloride 0.9 % bolus 1,000 mL (0 mLs Intravenous Stopped 09/10/22 1839)  albuterol (PROVENTIL) (2.5 MG/3ML) 0.083% nebulizer solution 10 mg (10 mg Nebulization Given 09/10/22 1820)  ipratropium (ATROVENT) nebulizer solution 0.5 mg (0.5 mg Nebulization Given 09/10/22 1821)  magnesium sulfate IVPB 2 g 50 mL (0 g Intravenous Stopped 09/10/22 1917)  methylPREDNISolone sodium succinate (SOLU-MEDROL) 125 mg/2 mL injection 125 mg (125 mg Intravenous Given 09/10/22 1812)  lactated ringers bolus 1,000 mL (0 mLs  Intravenous Stopped 09/10/22 2034)    Mobility Pt has  not ambulated for me, uses urinal gets ShOB with movement and currently on 10L HFNC      Focused Assessments    R Recommendations: See Admitting Provider Note  Report given to:   Additional Notes: pt aox4, on hi-flow O2, pt aware needs sputum specimen unable to provide at this time

## 2022-09-12 DIAGNOSIS — J9621 Acute and chronic respiratory failure with hypoxia: Secondary | ICD-10-CM | POA: Diagnosis not present

## 2022-09-12 LAB — CBC
HCT: 32.4 % — ABNORMAL LOW (ref 39.0–52.0)
Hemoglobin: 10.5 g/dL — ABNORMAL LOW (ref 13.0–17.0)
MCH: 27.6 pg (ref 26.0–34.0)
MCHC: 32.4 g/dL (ref 30.0–36.0)
MCV: 85.3 fL (ref 80.0–100.0)
Platelets: 239 10*3/uL (ref 150–400)
RBC: 3.8 MIL/uL — ABNORMAL LOW (ref 4.22–5.81)
RDW: 16.3 % — ABNORMAL HIGH (ref 11.5–15.5)
WBC: 20 10*3/uL — ABNORMAL HIGH (ref 4.0–10.5)
nRBC: 0 % (ref 0.0–0.2)

## 2022-09-12 LAB — BASIC METABOLIC PANEL
Anion gap: 7 (ref 5–15)
BUN: 19 mg/dL (ref 8–23)
CO2: 23 mmol/L (ref 22–32)
Calcium: 7.7 mg/dL — ABNORMAL LOW (ref 8.9–10.3)
Chloride: 107 mmol/L (ref 98–111)
Creatinine, Ser: 1.04 mg/dL (ref 0.61–1.24)
GFR, Estimated: >60 mL/min (ref >60)
Glucose, Bld: 168 mg/dL — ABNORMAL HIGH (ref 70–99)
Potassium: 4 mmol/L (ref 3.5–5.1)
Sodium: 137 mmol/L (ref 135–145)

## 2022-09-12 MED ORDER — MELATONIN 5 MG PO TABS
10.0000 mg | ORAL_TABLET | Freq: Every evening | ORAL | Status: DC | PRN
  Administered 2022-09-12 – 2022-09-15 (×5): 10 mg via ORAL
  Filled 2022-09-12 (×5): qty 2

## 2022-09-12 MED ORDER — IPRATROPIUM-ALBUTEROL 0.5-2.5 (3) MG/3ML IN SOLN
3.0000 mL | Freq: Two times a day (BID) | RESPIRATORY_TRACT | Status: DC
  Administered 2022-09-13 – 2022-09-16 (×7): 3 mL via RESPIRATORY_TRACT
  Filled 2022-09-12 (×8): qty 3

## 2022-09-12 MED ORDER — GUAIFENESIN-DM 100-10 MG/5ML PO SYRP
5.0000 mL | ORAL_SOLUTION | ORAL | Status: DC | PRN
  Administered 2022-09-12 (×2): 5 mL via ORAL
  Filled 2022-09-12 (×2): qty 5

## 2022-09-12 NOTE — Evaluation (Signed)
Occupational Therapy Evaluation Patient Details Name: Frank Meyer MRN: [id number] DOB: 1940/10/04 Today's Date: 09/12/2022   History of Present Illness Pt is an 82 y/o M presenting to ED On 1/25 with SOB and fall at home, admitted for acute on chronic respiratory failure with hypoxia. PMH includes COPD, chronic diastolic CHF, memory loss, and seizures   Clinical Impression   Pt reports independence at baseline with ADLs and functional mobility, lives with spouse who can assist at d/c. Pt currently needing mod I -mod A for ADLs, mod I for bed mobility. SpO2 dropping to low 80's/high 70's with sitting EOB on 7L O2 (pt on 2-5L O2 baseline). Increased O2 to 9L, SpO2 slowly increasing to high 80's/low 90's with cues for breathing/rest. Further mobility deferred, pt placed in bed-chair position at end of session. Pt presenting with impairments listed below, will follow acutely. Recommend HHOT at d/c pending progression.     Recommendations for follow up therapy are one component of a multi-disciplinary discharge planning process, led by the attending physician.  Recommendations may be updated based on patient status, additional functional criteria and insurance authorization.   Follow Up Recommendations  Home health OT     Assistance Recommended at Discharge Intermittent Supervision/Assistance  Patient can return home with the following A little help with walking and/or transfers;A little help with bathing/dressing/bathroom;Assistance with cooking/housework;Assist for transportation;Help with stairs or ramp for entrance    Functional Status Assessment  Patient has had a recent decline in their functional status and demonstrates the ability to make significant improvements in function in a reasonable and predictable amount of time.  Equipment Recommendations  Tub/shower seat    Recommendations for Other Services PT consult     Precautions / Restrictions Precautions Precautions:  Fall Precaution Comments: watch O2 Restrictions Weight Bearing Restrictions: No      Mobility Bed Mobility Overal bed mobility: Modified Independent             General bed mobility comments: sits EOB without assist    Transfers                   General transfer comment: deferred due to decreasing SpO2      Balance Overall balance assessment: Needs assistance Sitting-balance support: Feet supported, Bilateral upper extremity supported Sitting balance-Leahy Scale: Normal Sitting balance - Comments: sits EOB x10 min without LOB                                   ADL either performed or assessed with clinical judgement   ADL Overall ADL's : Needs assistance/impaired Eating/Feeding: Modified independent   Grooming: Modified independent   Upper Body Bathing: Minimal assistance   Lower Body Bathing: Moderate assistance   Upper Body Dressing : Minimal assistance   Lower Body Dressing: Moderate assistance   Toilet Transfer: Minimal assistance;Stand-pivot;BSC/3in1   Toileting- Clothing Manipulation and Hygiene: Minimal assistance       Functional mobility during ADLs: Minimal assistance       Vision   Vision Assessment?: No apparent visual deficits     Perception Perception Perception Tested?: No   Praxis Praxis Praxis tested?: Not tested    Pertinent Vitals/Pain Pain Assessment Pain Assessment: No/denies pain     Hand Dominance     Extremity/Trunk Assessment Upper Extremity Assessment Upper Extremity Assessment: Overall WFL for tasks assessed   Lower Extremity Assessment Lower Extremity Assessment: Defer to PT evaluation  Cervical / Trunk Assessment Cervical / Trunk Assessment: Normal   Communication Communication Communication: No difficulties   Cognition Arousal/Alertness: Awake/alert Behavior During Therapy: WFL for tasks assessed/performed Overall Cognitive Status: Within Functional Limits for tasks assessed                                  General Comments: hx of memory loss     General Comments  SpO2 dropping to low 80's/high 70's on 7L, increased to 9L SpO2 in high 80's/low 90's    Exercises     Shoulder Instructions      Home Living Family/patient expects to be discharged to:: Private residence Living Arrangements: Spouse/significant other Available Help at Discharge: Family;Available 24 hours/day Type of Home: House Home Access: Stairs to enter [employer] of Steps: 1   Home Layout: Two level;Able to live on main level with bedroom/bathroom     Bathroom Shower/Tub: Teacher, early years/pre: Standard     Home Equipment: None   Additional Comments: has 89f of O2 line in the home (2L O2 nightly, 5L O2 during the day)      Prior Functioning/Environment Prior Level of Function : Independent/Modified Independent;Driving;History of Falls (last six months)             Mobility Comments: no AD use ADLs Comments: no assist for ADLs        OT Problem List: Cardiopulmonary status limiting activity;Decreased strength;Decreased activity tolerance;Decreased range of motion;Impaired balance (sitting and/or standing)      OT Treatment/Interventions: Self-care/ADL training;Therapeutic exercise;Energy conservation;DME and/or AE instruction;Therapeutic activities;Patient/family education;Balance training    OT Goals(Current goals can be found in the care plan section) Acute Rehab OT Goals Patient Stated Goal: none stated OT Goal Formulation: With patient Time For Goal Achievement: 09/26/22 Potential to Achieve Goals: Good ADL Goals Pt Will Perform Upper Body Dressing: with modified independence;sitting Pt Will Perform Lower Body Dressing: with modified independence;sit to/from stand;sitting/lateral leans Pt Will Transfer to Toilet: with modified independence;ambulating;regular height toilet Pt Will Perform Tub/Shower Transfer: Tub  transfer;Shower transfer;with modified independence;ambulating  OT Frequency: Min 2X/week    Co-evaluation              AM-PAC OT "6 Clicks" Daily Activity     Outcome Measure Help from another person eating meals?: None Help from another person taking care of personal grooming?: A Little Help from another person toileting, which includes using toliet, bedpan, or urinal?: A Little Help from another person bathing (including washing, rinsing, drying)?: A Lot Help from another person to put on and taking off regular upper body clothing?: A Little Help from another person to put on and taking off regular lower body clothing?: A Lot 6 Click Score: 17   End of Session Equipment Utilized During Treatment: Oxygen (7-9L) Nurse Communication: Mobility status;Other (comment) (increasing O2 needs)  Activity Tolerance: Patient tolerated treatment well Patient left: in bed;with bed alarm set;with call bell/phone within reach;with nursing/sitter in room (bed in chair position)  OT Visit Diagnosis: Unsteadiness on feet (R26.81);Other abnormalities of gait and mobility (R26.89);Muscle weakness (generalized) (M62.81)                Time: 08937-3428OT Time Calculation (min): 22 min Charges:  OT General Charges $OT Visit: 1 Visit OT Evaluation $OT Eval Moderate Complexity: 1 Mod  Dorthia Tout K, OTD, OTR/L [employer] Preferred Acute Rehab [phone]   NUlla Gallo1/27/2024,  9:14 AM

## 2022-09-12 NOTE — Evaluation (Addendum)
Physical Therapy Evaluation Patient Details Name: Frank Meyer MRN: [id number] DOB: March 12, 1941 Today's Date: 09/12/2022  History of Present Illness  Pt is an 82 y/o M presenting to ED On 1/25 with SOB and fall at home, admitted for acute on chronic respiratory failure with hypoxia. PMH includes COPD, chronic diastolic CHF, memory loss, and seizures  Clinical Impression  Pt admitted with above diagnosis. Limited assessment, due to desaturating. Resting on 6L supplemental O2 at 92%. Upon sitting EOB (no assist,) dropped to 74%, did not recover with increase in oxygen and breathing techniques. Returned to supine and improved within 2 minutes to 92% on 6L again - respiratory entering room at end of session. Will check back for progress in tolerance with mobility as symptoms and respiratory status improves. Patient had finished HHPT prior to admission and was scheduling transition to cardiopulmonary rehab but has not been fully set-up yet. Would greatly benefit from this outpatient resource once medically stable to d/c.  Pt currently with functional limitations due to the deficits listed below (see PT Problem List). Pt will benefit from skilled PT to increase their independence and safety with mobility to allow discharge to the venue listed below.          Recommendations for follow up therapy are one component of a multi-disciplinary discharge planning process, led by the attending physician.  Recommendations may be updated based on patient status, additional functional criteria and insurance authorization.  Follow Up Recommendations Other (comment) (Cardiopulmonary Rehab - Outpatient)      Assistance Recommended at Discharge Intermittent Supervision/Assistance  Patient can return home with the following  A little help with bathing/dressing/bathroom;Assistance with cooking/housework;Assist for transportation    Equipment Recommendations None recommended by PT  Recommendations for Other  Services       Functional Status Assessment Patient has had a recent decline in their functional status and demonstrates the ability to make significant improvements in function in a reasonable and predictable amount of time.     Precautions / Restrictions Precautions Precautions: Fall Precaution Comments: watch O2 Restrictions Weight Bearing Restrictions: No      Mobility  Bed Mobility Overal bed mobility: Modified Independent             General bed mobility comments: sits EOB without assist, good strength and coordination    Transfers                   General transfer comment: deferred due to rapid and sudden decreasing SpO2    Ambulation/Gait                  Stairs            Wheelchair Mobility    Modified [name] (Stroke Patients Only)       Balance Overall balance assessment: Needs assistance Sitting-balance support: Feet supported, No upper extremity supported Sitting balance-Leahy Scale: Normal Sitting balance - Comments: sits EOB x5 min without LOB                                     Pertinent Vitals/Pain Pain Assessment Pain Assessment: No/denies pain    Home Living Family/patient expects to be discharged to:: Private residence Living Arrangements: Spouse/significant other Available Help at Discharge: Family;Available 24 hours/day Type of Home: House Home Access: Stairs to enter   [employer] of Steps: 1   Home Layout: Two level;Able to live on main level  with bedroom/bathroom Home Equipment: None Additional Comments: has 7f of O2 line in the home (2L O2 nightly, 5L O2 during the day)    Prior Function Prior Level of Function : Independent/Modified Independent;Driving;History of Falls (last six months)             Mobility Comments: no AD use ADLs Comments: no assist for ADLs     Hand Dominance   Dominant Hand: Right    Extremity/Trunk Assessment   Upper Extremity  Assessment Upper Extremity Assessment: Defer to OT evaluation    Lower Extremity Assessment Lower Extremity Assessment: Generalized weakness    Cervical / Trunk Assessment Cervical / Trunk Assessment: Normal  Communication   Communication: No difficulties  Cognition Arousal/Alertness: Awake/alert Behavior During Therapy: WFL for tasks assessed/performed Overall Cognitive Status: Within Functional Limits for tasks assessed                                 General Comments: hx of memory loss        General Comments General comments (skin integrity, edema, etc.): SpO2 down to 74% on 6L upon sitting EOB. increased to 7L and focused on breathing techniques with minimal improvement to 80% max. Returned to reclined position in bed, increased to 92% on 6L. Respiratory to room as PT session d/c.    Exercises General Exercises - Lower Extremity Ankle Circles/Pumps: AROM, Both, 10 reps, Supine Quad Sets: Strengthening, Both, 10 reps, Supine Gluteal Sets: Strengthening, Both, 10 reps, Supine Heel Slides: Strengthening, Both, 10 reps, Supine   Assessment/Plan    PT Assessment Patient needs continued PT services  PT Problem List Decreased activity tolerance;Cardiopulmonary status limiting activity       PT Treatment Interventions DME instruction;Gait training;Stair training;Functional mobility training;Therapeutic activities;Therapeutic exercise;Neuromuscular re-education;Balance training;Patient/family education    PT Goals (Current goals can be found in the Care Plan section)  Acute Rehab PT Goals Patient Stated Goal: Go home, start cardiopulmonary rehab PT Goal Formulation: With patient Time For Goal Achievement: 09/18/22 Potential to Achieve Goals: Good    Frequency Min 3X/week     Co-evaluation               AM-PAC PT "6 Clicks" Mobility  Outcome Measure Help needed turning from your back to your side while in a flat bed without using bedrails?:  None Help needed moving from lying on your back to sitting on the side of a flat bed without using bedrails?: None Help needed moving to and from a bed to a chair (including a wheelchair)?: A Little Help needed standing up from a chair using your arms (e.g., wheelchair or bedside chair)?: A Little Help needed to walk in hospital room?: A Little Help needed climbing 3-5 steps with a railing? : A Little 6 Click Score: 20    End of Session Equipment Utilized During Treatment: Oxygen Activity Tolerance: Treatment limited secondary to medical complications (Comment) (Desaturating) Patient left: in bed;with call bell/phone within reach;with bed alarm set;with nursing/sitter in room Nurse Communication: Mobility status;Other (comment) (sats) PT Visit Diagnosis: History of falling (Z91.81);Difficulty in walking, not elsewhere classified (R26.2)    Time: [address]PT Time Calculation (min) (ACUTE ONLY): 22 min   Charges:   PT Evaluation $PT Eval Moderate Complexity: 1 Mod          LCandie Mile PT, DPT Physical Therapist [facility]  [place] 09/12/2022, 3:21 PM

## 2022-09-12 NOTE — Progress Notes (Signed)
PROGRESS NOTE    Frank Meyer  LKG:[id number] DOB: Jun 30, 1941 DOA: 09/10/2022 PCP: Burnard Bunting, MD    Brief Narrative:  82 year old gentleman with history of COPD and chronic hypoxic respiratory failure on 5 L of oxygen at home, chronic diastolic heart failure, history of seizures presented to the emergency department for low oxygen saturation, an episode of fall at home.  According to the patient, early morning yesterday he fell while coming back from bathroom and not sure what happened.  Recently with increasing oxygen requirement and exertional dyspnea.  In the emergency room he was afebrile.  He was saturating 70% on 5 L of oxygen, he was tachypneic and tachycardic with systolic blood pressure 88.  EKG with sinus cardia.  Chest x-ray with interstitial changes mostly chronic.  Creatinine 1.65.  D-dimer 0.55.  Lactic acid 3.6.  Blood cultures were collected.  Patient was started on IV steroids and albuterol therapy and admitted to the hospital.   Assessment & Plan:   COPD with acute exacerbation: Acute on chronic hypoxemic respiratory failure secondary to COPD: COVID, RSV and flu negative. Continue care in the hospital because of severity of symptoms.   Changing IV steroids to oral steroids today.  Continue with inhalational steroids, scheduled and as needed bronchodilators, deep breathing exercises, incentive spirometry, chest physiotherapy.  Mobilize with PT OT today. Antibiotics due to severity of symptoms.  Rocephin to continue while in the hospital.  Leukocytosis likely due to high-dose steroids. Supplemental oxygen to keep saturations more than 90%. Start physical and Occupational Therapy. Uses up to 5 L of oxygen at home.  His oxygen requirement may go up ultimately.  Acute kidney injury: Treated with IV fluids.  Renal functions back to baseline.  Discontinue further IV fluids.  Chronic medical issues including Chronic diastolic heart failure, well compensated on  Entresto History of seizure, on Keppra Memory loss, on Aricept.  Delirium precautions.  Fall precautions.   DVT prophylaxis: heparin injection 5,000 Units Start: 09/10/22 2200   Code Status: Full code Family Communication: Wife on the speaker phone. Disposition Plan: Status is: Inpatient Remains inpatient appropriate because: Significant shortness of breath, still on significant oxygen.     Consultants:  None  Procedures:  None  Antimicrobials:  Rocephin 1/25---   Subjective: Patient seen and examined.  Very frequent intervention and could not get good sleep until he got his trazodone and melatonin.  Has some dry cough.  Denies any other complaints.  Still on 7 L of oxygen and looks at the sats numbers and gets anxious.  Patient otherwise asked me when he can go home.  Remains afebrile. Wife on the phone.   Objective: Vitals:   09/12/22 0414 09/12/22 0500 09/12/22 0720 09/12/22 0900  BP: 104/62  113/67   Pulse: 74  65   Resp: 19  20   Temp: 97.6 F (36.4 C)  97.7 F (36.5 C)   TempSrc: Oral  Oral   SpO2: 95%  93% (!) 88%  Weight:  79.1 kg    Height:        Intake/Output Summary (Last 24 hours) at 09/12/2022 1124 Last data filed at 09/12/2022 0746 Gross per 24 hour  Intake 1553 ml  Output 1900 ml  Net -347 ml    Filed Weights   09/11/22 1643 09/12/22 0011 09/12/22 0500  Weight: 77.1 kg 79.1 kg 79.1 kg    Examination:  General exam:  Chronically sick looking.  Not in any distress.  Able to talk in complete  sentences though he is on 7 L of oxygen. Respiratory system: Occasional expiratory wheezes.  No crackles. Good air entry bilateral. SpO2: (!) 88 % O2 Flow Rate (L/min): 9 L/min  Cardiovascular system: S1 & S2 heard, RRR. No pedal edema. Gastrointestinal system: Soft.  Nontender.  Bowel sound present. Central nervous system: Alert and oriented. No focal neurological deficits. Extremities: Symmetric 5 x 5 power. Skin: No rashes, lesions or  ulcers Psychiatry: Judgement and insight appear normal. Mood & affect appropriate.     Data Reviewed: I have personally reviewed following labs and imaging studies  CBC: Recent Labs  Lab 09/10/22 1743 09/11/22 0209 09/12/22 0018  WBC 14.4* 7.0 20.0*  NEUTROABS 7.3  --   --   HGB 12.4* 11.3* 10.5*  HCT 40.1 35.3* 32.4*  MCV 87.9 87.4 85.3  PLT 267 222 546    Basic Metabolic Panel: Recent Labs  Lab 09/10/22 1743 09/11/22 0209 09/12/22 0018  NA 136 138 137  K 4.1 3.9 4.0  CL 104 106 107  CO2 19* 21* 23  GLUCOSE 137* 234* 168*  BUN '19 16 19  '$ CREATININE 1.65* 1.19 1.04  CALCIUM 8.4* 7.8* 7.7*    GFR: Estimated Creatinine Clearance: 55.1 mL/min (by C-G formula based on SCr of 1.04 mg/dL). Liver Function Tests: Recent Labs  Lab 09/10/22 1743  AST 23  ALT 20  ALKPHOS 44  BILITOT 1.0  PROT 5.9*  ALBUMIN 2.8*    No results for input(s): "LIPASE", "AMYLASE" in the last 168 hours. No results for input(s): "AMMONIA" in the last 168 hours. Coagulation Profile: No results for input(s): "INR", "PROTIME" in the last 168 hours. Cardiac Enzymes: No results for input(s): "CKTOTAL", "CKMB", "CKMBINDEX", "TROPONINI" in the last 168 hours. BNP (last 3 results) No results for input(s): "PROBNP" in the last 8760 hours. HbA1C: No results for input(s): "HGBA1C" in the last 72 hours. CBG: No results for input(s): "GLUCAP" in the last 168 hours. Lipid Profile: No results for input(s): "CHOL", "HDL", "LDLCALC", "TRIG", "CHOLHDL", "LDLDIRECT" in the last 72 hours. Thyroid Function Tests: No results for input(s): "TSH", "T4TOTAL", "FREET4", "T3FREE", "THYROIDAB" in the last 72 hours. Anemia Panel: No results for input(s): "VITAMINB12", "FOLATE", "FERRITIN", "TIBC", "IRON", "RETICCTPCT" in the last 72 hours. Sepsis Labs: Recent Labs  Lab 09/10/22 1759 09/10/22 2006  LATICACIDVEN 3.6* 1.5     Recent Results (from the past 240 hour(s))  Blood culture (routine x 2)      Status: None (Preliminary result)   Collection Time: 09/10/22  5:59 PM   Specimen: BLOOD RIGHT FOREARM  Result Value Ref Range Status   Specimen Description BLOOD RIGHT FOREARM  Final   Special Requests   Final    BOTTLES DRAWN AEROBIC AND ANAEROBIC Blood Culture adequate volume   Culture   Final    NO GROWTH 2 DAYS Performed at [facility], [place] [address] [place], [place] [address]    Report Status PENDING  Incomplete  Blood culture (routine x 2)     Status: None (Preliminary result)   Collection Time: 09/10/22  6:04 PM   Specimen: BLOOD LEFT FOREARM  Result Value Ref Range Status   Specimen Description BLOOD LEFT FOREARM  Final   Special Requests   Final    BOTTLES DRAWN AEROBIC AND ANAEROBIC Blood Culture adequate volume   Culture   Final    NO GROWTH 2 DAYS Performed at [facility], [place] [address], [place], [place] [address]    Report Status  PENDING  Incomplete  Resp panel by RT-PCR (RSV, Flu A&B, Covid) Anterior Nasal Swab     Status: None   Collection Time: 09/10/22  6:08 PM   Specimen: Anterior Nasal Swab  Result Value Ref Range Status   SARS Coronavirus 2 by RT PCR NEGATIVE NEGATIVE Final    Comment: (NOTE) SARS-CoV-2 target nucleic acids are NOT DETECTED.  The SARS-CoV-2 RNA is generally detectable in upper respiratory specimens during the acute phase of infection. The lowest concentration of SARS-CoV-2 viral copies this assay can detect is 138 copies/mL. A negative result does not preclude SARS-Cov-2 infection and should not be used as the sole basis for treatment or other patient management decisions. A negative result may occur with  improper specimen collection/handling, submission of specimen other than nasopharyngeal swab, presence of viral mutation(s) within the areas targeted by this assay, and inadequate number of viral copies(<138 copies/mL). A negative result must be combined with clinical observations, patient history, and  epidemiological information. The expected result is Negative.  Fact Sheet for Patients:  EntrepreneurPulse.com.au  Fact Sheet for Healthcare Providers:  IncredibleEmployment.be  This test is no t yet approved or cleared by the [place] FDA and  has been authorized for detection and/or diagnosis of SARS-CoV-2 by FDA under an Emergency Use Authorization (EUA). This EUA will remain  in effect (meaning this test can be used) for the duration of the COVID-19 declaration under Section 564(b)(1) of the Act, 21 U.S.C.section 360bbb-3(b)(1), unless the authorization is terminated  or revoked sooner.       Influenza A by PCR NEGATIVE NEGATIVE Final   Influenza B by PCR NEGATIVE NEGATIVE Final    Comment: (NOTE) The Xpert Xpress SARS-CoV-2/FLU/RSV plus assay is intended as an aid in the diagnosis of influenza from Nasopharyngeal swab specimens and should not be used as a sole basis for treatment. Nasal washings and aspirates are unacceptable for Xpert Xpress SARS-CoV-2/FLU/RSV testing.  Fact Sheet for Patients: EntrepreneurPulse.com.au  Fact Sheet for Healthcare Providers: IncredibleEmployment.be  This test is not yet approved or cleared by the [place] FDA and has been authorized for detection and/or diagnosis of SARS-CoV-2 by FDA under an Emergency Use Authorization (EUA). This EUA will remain in effect (meaning this test can be used) for the duration of the COVID-19 declaration under Section 564(b)(1) of the Act, 21 U.S.C. section 360bbb-3(b)(1), unless the authorization is terminated or revoked.     Resp Syncytial Virus by PCR NEGATIVE NEGATIVE Final    Comment: (NOTE) Fact Sheet for Patients: EntrepreneurPulse.com.au  Fact Sheet for Healthcare Providers: IncredibleEmployment.be  This test is not yet approved or cleared by the [place] FDA and has been  authorized for detection and/or diagnosis of SARS-CoV-2 by FDA under an Emergency Use Authorization (EUA). This EUA will remain in effect (meaning this test can be used) for the duration of the COVID-19 declaration under Section 564(b)(1) of the Act, 21 U.S.C. section 360bbb-3(b)(1), unless the authorization is terminated or revoked.  Performed at [facility], [place] [address] [place], [place] [address]          Radiology Studies: US RENAL  Result Date: 09/10/2022 CLINICAL DATA:  Acute kidney injury. EXAM: RENAL / URINARY TRACT ULTRASOUND COMPLETE COMPARISON:  None Available. FINDINGS: Right Kidney: Renal measurements: 9.1 x 4.6 x 4.9 cm = volume: 107 mL. Echogenicity within normal limits. No mass or hydronephrosis visualized. Left Kidney: Renal measurements: 10.3 x 5.7 x 4.0 cm = volume: 122 mL. Echogenicity within normal limits. No  mass or hydronephrosis visualized. Bladder: Appears normal for degree of bladder distention. Other: None. IMPRESSION: Normal renal ultrasound. Electronically Signed   By: Ronney Asters M.D.   On: 09/10/2022 22:14   DG Chest Portable 1 View  Result Date: 09/10/2022 CLINICAL DATA:  Shortness of breath EXAM: PORTABLE CHEST 1 VIEW COMPARISON:  08/02/2022 x-ray and older.  CT angiogram 07/20/2022 FINDINGS: Underinflation. Enlarged cardiopericardial silhouette. Diffuse interstitial changes and other chronic lung changes. Similar to previous. Question tiny right effusion. No pneumothorax. Overlapping cardiac leads. Apical pleural thickening on the right. IMPRESSION: Underinflation with interstitial changes, possibly chronic. Small right pleural effusion. Enlarged heart. Appearance is similar to previous Electronically Signed   By: Jill Side M.D.   On: 09/10/2022 18:13        Scheduled Meds:  aspirin EC  81 mg Oral Daily   atorvastatin  20 mg Oral QPM   donepezil  10 mg Oral QHS   finasteride  5 mg Oral QHS   heparin  5,000 Units Subcutaneous Q8H    ipratropium-albuterol  3 mL Nebulization Q6H   levETIRAcetam  250 mg Oral q AM   levETIRAcetam  500 mg Oral QHS   pantoprazole  40 mg Oral Daily   predniSONE  40 mg Oral Q breakfast   sacubitril-valsartan  1 tablet Oral QPM   sodium chloride flush  3 mL Intravenous Q12H   traZODone  200 mg Oral QHS   Continuous Infusions:  cefTRIAXone (ROCEPHIN)  IV Stopped (09/12/22 0700)     LOS: 2 days    Time spent: 35 minutes    Barb Merino, MD [facility] Pager [phone]

## 2022-09-12 NOTE — Progress Notes (Signed)
[facility] night cross cover note:   I was notified by RN of the patient's request for resumption of home melatonin 10 mg p.o. nightly as sleep aid. I subsequently placed order for melatonin 10 mg PO QHS prn for insomnia.    Babs Bertin, DO Hospitalist

## 2022-09-13 ENCOUNTER — Inpatient Hospital Stay (HOSPITAL_COMMUNITY): Payer: Medicare Other

## 2022-09-13 DIAGNOSIS — J9621 Acute and chronic respiratory failure with hypoxia: Secondary | ICD-10-CM | POA: Diagnosis not present

## 2022-09-13 LAB — CBC
HCT: 30.5 % — ABNORMAL LOW (ref 39.0–52.0)
Hemoglobin: 10 g/dL — ABNORMAL LOW (ref 13.0–17.0)
MCH: 27.8 pg (ref 26.0–34.0)
MCHC: 32.8 g/dL (ref 30.0–36.0)
MCV: 84.7 fL (ref 80.0–100.0)
Platelets: 253 10*3/uL (ref 150–400)
RBC: 3.6 MIL/uL — ABNORMAL LOW (ref 4.22–5.81)
RDW: 16.4 % — ABNORMAL HIGH (ref 11.5–15.5)
WBC: 18.3 10*3/uL — ABNORMAL HIGH (ref 4.0–10.5)
nRBC: 0 % (ref 0.0–0.2)

## 2022-09-13 LAB — BASIC METABOLIC PANEL
Anion gap: 4 — ABNORMAL LOW (ref 5–15)
BUN: 20 mg/dL (ref 8–23)
CO2: 26 mmol/L (ref 22–32)
Calcium: 7.7 mg/dL — ABNORMAL LOW (ref 8.9–10.3)
Chloride: 109 mmol/L (ref 98–111)
Creatinine, Ser: 0.98 mg/dL (ref 0.61–1.24)
GFR, Estimated: >60 mL/min (ref >60)
Glucose, Bld: 144 mg/dL — ABNORMAL HIGH (ref 70–99)
Potassium: 3.9 mmol/L (ref 3.5–5.1)
Sodium: 139 mmol/L (ref 135–145)

## 2022-09-13 LAB — PROCALCITONIN: Procalcitonin: <0.1 ng/mL

## 2022-09-13 LAB — MAGNESIUM: Magnesium: 2.4 mg/dL (ref 1.7–2.4)

## 2022-09-13 LAB — BRAIN NATRIURETIC PEPTIDE: B Natriuretic Peptide: 618.8 pg/mL — ABNORMAL HIGH (ref 0.0–100.0)

## 2022-09-13 LAB — C-REACTIVE PROTEIN: CRP: 2.4 mg/dL — ABNORMAL HIGH (ref <1.0)

## 2022-09-13 MED ORDER — VITAMIN D 25 MCG (1000 UNIT) PO TABS
1000.0000 [IU] | ORAL_TABLET | Freq: Every day | ORAL | Status: DC
  Administered 2022-09-13 – 2022-09-16 (×4): 1000 [IU] via ORAL
  Filled 2022-09-13 (×4): qty 1

## 2022-09-13 MED ORDER — POTASSIUM CHLORIDE CRYS ER 20 MEQ PO TBCR
40.0000 meq | EXTENDED_RELEASE_TABLET | Freq: Once | ORAL | Status: AC
  Administered 2022-09-13: 40 meq via ORAL
  Filled 2022-09-13: qty 2

## 2022-09-13 MED ORDER — FUROSEMIDE 10 MG/ML IJ SOLN
60.0000 mg | Freq: Once | INTRAMUSCULAR | Status: AC
  Administered 2022-09-13: 60 mg via INTRAVENOUS
  Filled 2022-09-13: qty 6

## 2022-09-13 MED ORDER — AZITHROMYCIN 250 MG PO TABS
500.0000 mg | ORAL_TABLET | Freq: Every day | ORAL | Status: DC
  Administered 2022-09-13 – 2022-09-16 (×4): 500 mg via ORAL
  Filled 2022-09-13 (×4): qty 2

## 2022-09-13 MED ORDER — THIAMINE MONONITRATE 100 MG PO TABS
100.0000 mg | ORAL_TABLET | Freq: Every day | ORAL | Status: DC
  Administered 2022-09-13 – 2022-09-16 (×4): 100 mg via ORAL
  Filled 2022-09-13 (×6): qty 1

## 2022-09-13 MED ORDER — NITROGLYCERIN 0.4 MG SL SUBL: 0.4000 mg | SUBLINGUAL_TABLET | SUBLINGUAL | Status: DC | PRN

## 2022-09-13 NOTE — Progress Notes (Signed)
Mobility Specialist Progress Note    09/13/22 1414  Mobility  Activity Ambulated with assistance in hallway  Level of Assistance Contact guard assist, steadying assist  Assistive Device Other (Comment) (HHA)  Distance Ambulated (ft) 350 ft  Activity Response Tolerated well  Mobility Referral Yes  $Mobility charge 1 Mobility   Pre-Mobility: 71 HR, 94% SpO2 on 6LO2 During Mobility: 121 HR Post-Mobility: 76 HR, 93% SpO2 on 6LO2  Pt received in chair and agreeable. No complaints. Encouraged pursed lip breathing. SpO2 into low 80s during on 6LO2. Maintained SpO2 in low 90s on 10LO2. Took a few short standing rest breaks to recover. Returned to chair with call bell in reach.   Hildred Alamin Mobility Specialist  Please Psychologist, sport and exercise or Rehab Office at [phone]

## 2022-09-13 NOTE — Progress Notes (Signed)
PROGRESS NOTE    Frank Meyer  YKD:[id number] DOB: 1940-11-03 DOA: 09/10/2022 PCP: Burnard Bunting, MD    Brief Narrative:  82 year old gentleman with history of COPD and chronic hypoxic respiratory failure on 5 L of oxygen at home, chronic diastolic heart failure, history of seizures presented to the emergency department for low oxygen saturation, an episode of fall at home.  According to the patient, early morning yesterday he fell while coming back from bathroom and not sure what happened.  Recently with increasing oxygen requirement and exertional dyspnea.  In the emergency room he was afebrile.  He was saturating 70% on 5 L of oxygen, he was tachypneic and tachycardic with systolic blood pressure 88.  EKG with sinus cardia.  Chest x-ray with interstitial changes mostly chronic.  Creatinine 1.65.  D-dimer 0.55.  Lactic acid 3.6.  Blood cultures were collected.  Patient was started on IV steroids and albuterol therapy and admitted to the hospital.   Assessment & Plan:   Acute hypoxic respiratory failure.  In a patient with history of COPD. He is been treated with steroids, continues to have significant hypoxic respiratory failure requiring 1 to 8 L of oxygen at rest, at this time I think most of his symptoms are due to acute on chronic diastolic CHF, he has orthopnea and crackles, BNP is elevated.  Will initiate moderate dose IV Lasix this morning, continue to taper steroids, add azithromycin for atypical coverage do not think he has pneumonia.  Encouraged to sit up in chair in daytime use I-S and flutter valve for pulmonary toiletry.  Advance activity and titrate down oxygen as tolerated.  Acute on chronic chronic heart failure EF 60% on recent echocardiogram.  Treatment as in above continue home Entresto.  Patient follow-up with cardiology postdischarge.  Acute kidney injury: Treated with IV fluids.  Renal functions back to baseline.  Discontinue further IV fluids.  History of seizure,  on Keppra  Memory loss, on Aricept.  Delirium precautions.  Fall precautions.   DVT prophylaxis: heparin injection 5,000 Units Start: 09/10/22 2200   Code Status: Full code Family Communication: Wife on the speaker phone. Disposition Plan: Status is: Inpatient Remains inpatient appropriate because: Significant shortness of breath, still on significant oxygen.     Consultants:  None  Procedures:  None     Subjective:   Patient in bed, appears comfortable, denies any headache, no fever, no chest pain or pressure, has some orthopnea and exertional shortness of breath , no abdominal pain. No new focal weakness.    Objective: Vitals:   09/13/22 0014 09/13/22 0348 09/13/22 0700 09/13/22 0831  BP: 119/67 118/70 106/71   Pulse:  61 (!) 48   Resp: '17 10 14 18  '$ Temp: 97.8 F (36.6 C) (!) 97.5 F (36.4 C) 97.7 F (36.5 C)   TempSrc: Oral Oral Oral   SpO2: 91% 92% 92% 92%  Weight:  78.7 kg    Height:        Intake/Output Summary (Last 24 hours) at 09/13/2022 0915 Last data filed at 09/13/2022 0600 Gross per 24 hour  Intake 880 ml  Output 2450 ml  Net -1570 ml   Filed Weights   09/12/22 0011 09/12/22 0500 09/13/22 0348  Weight: 79.1 kg 79.1 kg 78.7 kg    Examination:  Awake Alert, No new F.N deficits, Normal affect [place].AT,PERRAL Supple Neck, No JVD,   Symmetrical Chest wall movement, Good air movement bilaterally, no wheezing but positive crackles RRR,No Gallops, Rubs or new Murmurs,  +  ve B.Sounds, Abd Soft, No tenderness,   No Cyanosis, Clubbing or edema     Data Reviewed: I have personally reviewed following labs and imaging studies  Recent Labs  Lab 09/10/22 1743 09/11/22 0209 09/12/22 0018 09/13/22 0031  WBC 14.4* 7.0 20.0* 18.3*  HGB 12.4* 11.3* 10.5* 10.0*  HCT 40.1 35.3* 32.4* 30.5*  PLT 267 222 239 253  MCV 87.9 87.4 85.3 84.7  MCH 27.2 28.0 27.6 27.8  MCHC 30.9 32.0 32.4 32.8  RDW 16.6* 16.3* 16.3* 16.4*  LYMPHSABS 5.0*  --   --   --    MONOABS 1.4*  --   --   --   EOSABS 0.3  --   --   --   BASOSABS 0.1  --   --   --     Recent Labs  Lab 09/10/22 1743 09/10/22 1759 09/10/22 2006 09/11/22 0209 09/12/22 0018 09/13/22 0031 09/13/22 0642  NA 136  --   --  138 137 139  --   K 4.1  --   --  3.9 4.0 3.9  --   CL 104  --   --  106 107 109  --   CO2 19*  --   --  21* 23 26  --   ANIONGAP 13  --   --  11 7 4*  --   GLUCOSE 137*  --   --  234* 168* 144*  --   BUN 19  --   --  '16 19 20  '$ --   CREATININE 1.65*  --   --  1.19 1.04 0.98  --   AST 23  --   --   --   --   --   --   ALT 20  --   --   --   --   --   --   ALKPHOS 44  --   --   --   --   --   --   BILITOT 1.0  --   --   --   --   --   --   ALBUMIN 2.8*  --   --   --   --   --   --   CRP  --   --   --   --   --   --  2.4*  DDIMER 0.55*  --   --   --   --   --   --   LATICACIDVEN  --  3.6* 1.5  --   --   --   --   BNP 254.8*  --   --   --   --   --  618.8*  MG  --   --   --   --   --   --  2.4  CALCIUM 8.4*  --   --  7.8* 7.7* 7.7*  --       Recent Labs  Lab 09/10/22 1743 09/10/22 1759 09/10/22 2006 09/11/22 0209 09/12/22 0018 09/13/22 0031 09/13/22 0642  CRP  --   --   --   --   --   --  2.4*  DDIMER 0.55*  --   --   --   --   --   --   LATICACIDVEN  --  3.6* 1.5  --   --   --   --   BNP 254.8*  --   --   --   --   --  618.8*  MG  --   --   --   --   --   --  2.4  CALCIUM 8.4*  --   --  7.8* 7.7* 7.7*  --     Radiology Studies: DG Chest Port 1 View  Result Date: 09/13/2022 CLINICAL DATA:  82 year old male with shortness of breath. EXAM: PORTABLE CHEST 1 VIEW COMPARISON:  Portable chest 09/10/2022 and earlier. FINDINGS: Portable AP upright view at 0744 hours. 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 Stable lung volumes and mediastinal contours. Unchanged lung markings from 09/10/2022. No pneumothorax, pleural effusion or consolidation. Decreased bowel gas in the visible abdomen. Chronic epigastric and  left upper quadrant surgical clips. Stable visualized osseous structures. 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 No superimposed acute findings are identified. Electronically Signed   By: [name]   On: 09/13/2022 07:59     Scheduled Meds:  aspirin EC  81 mg Oral Daily   atorvastatin  20 mg Oral QPM   azithromycin  500 mg Oral Daily   cholecalciferol  1,000 Units Oral Daily   donepezil  10 mg Oral QHS   finasteride  5 mg Oral QHS   heparin  5,000 Units Subcutaneous Q8H   ipratropium-albuterol  3 mL Nebulization BID   levETIRAcetam  250 mg Oral q AM   levETIRAcetam  500 mg Oral QHS   pantoprazole  40 mg Oral Daily   predniSONE  40 mg Oral Q breakfast   sacubitril-valsartan  1 tablet Oral QPM   sodium chloride flush  3 mL Intravenous Q12H   thiamine  100 mg Oral Daily   traZODone  200 mg Oral QHS   Continuous Infusions:  cefTRIAXone (ROCEPHIN)  IV 1 g (09/12/22 2148)     LOS: 3 days   Signature  -    Lala Lund M.D on 09/13/2022 at 9:15 AM   -  To page go to [email or web address]

## 2022-09-13 NOTE — Plan of Care (Signed)
  Problem: Education: Goal: Knowledge of disease or condition will improve Outcome: Progressing   

## 2022-09-14 ENCOUNTER — Telehealth: Payer: Self-pay | Admitting: Pulmonary Disease

## 2022-09-14 DIAGNOSIS — J9621 Acute and chronic respiratory failure with hypoxia: Secondary | ICD-10-CM | POA: Diagnosis not present

## 2022-09-14 LAB — BASIC METABOLIC PANEL
Anion gap: 7 (ref 5–15)
BUN: 20 mg/dL (ref 8–23)
CO2: 26 mmol/L (ref 22–32)
Calcium: 7.9 mg/dL — ABNORMAL LOW (ref 8.9–10.3)
Chloride: 105 mmol/L (ref 98–111)
Creatinine, Ser: 1.12 mg/dL (ref 0.61–1.24)
GFR, Estimated: >60 mL/min (ref >60)
Glucose, Bld: 119 mg/dL — ABNORMAL HIGH (ref 70–99)
Potassium: 4.2 mmol/L (ref 3.5–5.1)
Sodium: 138 mmol/L (ref 135–145)

## 2022-09-14 LAB — CBC WITH DIFFERENTIAL/PLATELET
Abs Immature Granulocytes: 0.69 10*3/uL — ABNORMAL HIGH (ref 0.00–0.07)
Basophils Absolute: 0.1 10*3/uL (ref 0.0–0.1)
Basophils Relative: 0 %
Eosinophils Absolute: 0 10*3/uL (ref 0.0–0.5)
Eosinophils Relative: 0 %
HCT: 34.5 % — ABNORMAL LOW (ref 39.0–52.0)
Hemoglobin: 10.7 g/dL — ABNORMAL LOW (ref 13.0–17.0)
Immature Granulocytes: 4 %
Lymphocytes Relative: 16 %
Lymphs Abs: 2.5 10*3/uL (ref 0.7–4.0)
MCH: 26.8 pg (ref 26.0–34.0)
MCHC: 31 g/dL (ref 30.0–36.0)
MCV: 86.3 fL (ref 80.0–100.0)
Monocytes Absolute: 1.1 10*3/uL — ABNORMAL HIGH (ref 0.1–1.0)
Monocytes Relative: 7 %
Neutro Abs: 11.2 10*3/uL — ABNORMAL HIGH (ref 1.7–7.7)
Neutrophils Relative %: 73 %
Platelets: 272 10*3/uL (ref 150–400)
RBC: 4 MIL/uL — ABNORMAL LOW (ref 4.22–5.81)
RDW: 16.5 % — ABNORMAL HIGH (ref 11.5–15.5)
WBC: 15.6 10*3/uL — ABNORMAL HIGH (ref 4.0–10.5)
nRBC: 0 % (ref 0.0–0.2)

## 2022-09-14 LAB — MAGNESIUM: Magnesium: 2.3 mg/dL (ref 1.7–2.4)

## 2022-09-14 LAB — PROCALCITONIN: Procalcitonin: <0.1 ng/mL

## 2022-09-14 LAB — BRAIN NATRIURETIC PEPTIDE: B Natriuretic Peptide: 250.1 pg/mL — ABNORMAL HIGH (ref 0.0–100.0)

## 2022-09-14 LAB — C-REACTIVE PROTEIN: CRP: 2 mg/dL — ABNORMAL HIGH (ref <1.0)

## 2022-09-14 MED ORDER — PREDNISONE 20 MG PO TABS
20.0000 mg | ORAL_TABLET | Freq: Every day | ORAL | Status: AC
  Administered 2022-09-15: 20 mg via ORAL
  Filled 2022-09-14: qty 1

## 2022-09-14 MED ORDER — DIPHENHYDRAMINE-ZINC ACETATE 2-0.1 % EX CREA
TOPICAL_CREAM | Freq: Three times a day (TID) | CUTANEOUS | Status: DC | PRN
  Filled 2022-09-14: qty 28

## 2022-09-14 MED ORDER — FUROSEMIDE 10 MG/ML IJ SOLN
40.0000 mg | Freq: Once | INTRAMUSCULAR | Status: AC
  Administered 2022-09-14: 40 mg via INTRAVENOUS
  Filled 2022-09-14: qty 4

## 2022-09-14 MED ORDER — SPIRONOLACTONE 25 MG PO TABS
25.0000 mg | ORAL_TABLET | Freq: Once | ORAL | Status: AC
  Administered 2022-09-14: 25 mg via ORAL
  Filled 2022-09-14: qty 1

## 2022-09-14 NOTE — Progress Notes (Signed)
Occupational Therapy Treatment Patient Details Name: Frank Meyer MRN: [id number] DOB: 04/10/1941 Today's Date: 09/14/2022   History of present illness Pt is an 82 y/o M presenting to ED On 1/25 with SOB and fall at home, admitted for acute on chronic respiratory failure with hypoxia. PMH includes COPD, chronic diastolic CHF, memory loss, and seizures   OT comments  Pt progressing towards goals this session, needing supervision for standing grooming task at sink and min guard A for simulated tub transfer. Pt needing O2 increased from 5L to 9L during session, SpO2 dropping to high 70's, SpO2 slowly increased to mid 90's after a few mins, SpO2 titrated down to 5L and SpO2 remained in the 90's. Pt presenting with impairments listed below, will follow acutely. Continue to recommend HHOT at d/c.   Recommendations for follow up therapy are one component of a multi-disciplinary discharge planning process, led by the attending physician.  Recommendations may be updated based on patient status, additional functional criteria and insurance authorization.    Follow Up Recommendations  Home health OT     Assistance Recommended at Discharge Intermittent Supervision/Assistance  Patient can return home with the following  A little help with walking and/or transfers;A little help with bathing/dressing/bathroom;Assistance with cooking/housework;Assist for transportation;Help with stairs or ramp for entrance   Equipment Recommendations  Tub/shower seat    Recommendations for Other Services PT consult    Precautions / Restrictions Precautions Precautions: Fall Precaution Comments: watch O2 Restrictions Weight Bearing Restrictions: No       Mobility Bed Mobility Overal bed mobility: Modified Independent                  Transfers Overall transfer level: Needs assistance Equipment used: None Transfers: Sit to/from Stand Sit to Stand: Supervision                 Balance  Overall balance assessment: Needs assistance Sitting-balance support: Feet supported, No upper extremity supported Sitting balance-Leahy Scale: Normal     Standing balance support: During functional activity Standing balance-Leahy Scale: Good Standing balance comment: no AD use/no assist                           ADL either performed or assessed with clinical judgement   ADL Overall ADL's : Needs assistance/impaired     Grooming: Supervision/safety;Standing                           Tub/ Shower Transfer: Tub transfer;Min guard   Functional mobility during ADLs: Supervision/safety      Extremity/Trunk Assessment Upper Extremity Assessment Upper Extremity Assessment: Overall WFL for tasks assessed   Lower Extremity Assessment Lower Extremity Assessment: Defer to PT evaluation        Vision   Vision Assessment?: No apparent visual deficits   Perception Perception Perception: Not tested   Praxis Praxis Praxis: Not tested    Cognition Arousal/Alertness: Awake/alert Behavior During Therapy: WFL for tasks assessed/performed Overall Cognitive Status: Within Functional Limits for tasks assessed                                 General Comments: hx of memory loss        Exercises      Shoulder Instructions       General Comments SpO2 down to high 70's on 9L with grooming task  at sink, titrated down to 5L once seated in chair, returned to mid 90's after ~3 mins    Pertinent Vitals/ Pain       Pain Assessment Pain Assessment: No/denies pain  Home Living                                          Prior Functioning/Environment              Frequency  Min 2X/week        Progress Toward Goals  OT Goals(current goals can now be found in the care plan section)  Progress towards OT goals: Progressing toward goals  Acute Rehab OT Goals Patient Stated Goal: none stated OT Goal Formulation: With  patient Time For Goal Achievement: 09/26/22 Potential to Achieve Goals: Good ADL Goals Pt Will Perform Upper Body Dressing: with modified independence;sitting Pt Will Perform Lower Body Dressing: with modified independence;sit to/from stand;sitting/lateral leans Pt Will Transfer to Toilet: with modified independence;ambulating;regular height toilet Pt Will Perform Tub/Shower Transfer: Tub transfer;Shower transfer;with modified independence;ambulating  Plan Discharge plan remains appropriate;Frequency remains appropriate    Co-evaluation                 AM-PAC OT "6 Clicks" Daily Activity     Outcome Measure   Help from another person eating meals?: None Help from another person taking care of personal grooming?: A Little Help from another person toileting, which includes using toliet, bedpan, or urinal?: A Little Help from another person bathing (including washing, rinsing, drying)?: A Lot Help from another person to put on and taking off regular upper body clothing?: A Little Help from another person to put on and taking off regular lower body clothing?: A Lot 6 Click Score: 17    End of Session Equipment Utilized During Treatment: Oxygen (5-9L)  OT Visit Diagnosis: Unsteadiness on feet (R26.81);Other abnormalities of gait and mobility (R26.89);Muscle weakness (generalized) (M62.81)   Activity Tolerance Patient tolerated treatment well   Patient Left in chair;with call bell/phone within reach   Nurse Communication Mobility status;Other (comment) (O2 needs, confirmed pt can have coffee)        Time: 1194-1740 OT Time Calculation (min): 22 min  Charges: OT General Charges $OT Visit: 1 Visit OT Treatments $Self Care/Home Management : 8-22 mins  Frank Rakers, OTD, OTR/L SecureChat Preferred Acute Rehab [phone]   Frank Meyer 09/14/2022, 8:41 AM

## 2022-09-14 NOTE — Progress Notes (Signed)
   09/14/22 1500  Mobility  Activity Ambulated with assistance in hallway  Level of Assistance Standby assist, set-up cues, supervision of patient - no hands on  Assistive Device  (HHA)  Distance Ambulated (ft) 550 ft  Activity Response Tolerated well  Mobility Referral Yes  $Mobility charge 1 Mobility   Mobility Specialist Progress Note  Pt in bed and agreeable. Had no c/o pain. Left in bed w/ all needs met and call bell in reach  [place] Specialist  Please contact via [place] or Rehab office at [phone]

## 2022-09-14 NOTE — TOC Progression Note (Addendum)
Transition of Care [facility]) - Progression Note    Patient Details  Name: Frank Meyer MRN: [id number] Date of Birth: 03/18/1941  [facility] CM/SW Contact  Zenon Mayo, RN Phone Number: 09/14/2022, 8:56 AM  Clinical Narrative:    From home, presents with copd ex, AKI, chronic HF.  He is active with [place] for [place].  TOC following.        Expected Discharge Plan and Services                                               Social Determinants of Health (SDOH) Interventions SDOH Screenings   Food Insecurity: No Food Insecurity (09/11/2022)  Housing: Low Risk  (09/11/2022)  Transportation Needs: No Transportation Needs (09/11/2022)  Utilities: Not At Risk (09/11/2022)  Tobacco Use: Medium Risk (09/10/2022)    Readmission Risk Interventions     No data to display

## 2022-09-14 NOTE — Telephone Encounter (Signed)
FYI  Dr Silas Flood patient is in the hospital currently. I told him to call us after he is discharged for a hospital follow up. He verbalized understanding.   Just FYI sir

## 2022-09-14 NOTE — Progress Notes (Signed)
Physical Therapy Treatment Patient Details Name: Frank Meyer MRN: [id number] DOB: 18-Nov-1940 Today's Date: 09/14/2022   History of Present Illness Pt is an 82 y/o M presenting to ED On 1/25 with SOB and fall at home, admitted for acute on chronic respiratory failure with hypoxia. PMH includes COPD, chronic diastolic CHF, memory loss, and seizures    PT Comments    Pt reports walking earlier today, would like to ambulate in hallway again. Pt ambulatory in hallway without AD and tolerated challenges to gait, including head turns, directional changes, and fast/slow gait although does require increased time. X1 LOB, pt corrected, during gait. Pt requiring 6LO2 during gait, with SpO2 91% and greater during but drops to 83% during seated recovery post-gait. Pt requiring cues for breathing technique and rest to recover, placed back on 3LO2 at end of session. PT to continue to follow.     Recommendations for follow up therapy are one component of a multi-disciplinary discharge planning process, led by the attending physician.  Recommendations may be updated based on patient status, additional functional criteria and insurance authorization.  Follow Up Recommendations  Other (comment) (cardiopulm rehab)     Assistance Recommended at Discharge Intermittent Supervision/Assistance  Patient can return home with the following A little help with bathing/dressing/bathroom;Assistance with cooking/housework;Assist for transportation   Equipment Recommendations  None recommended by PT    Recommendations for Other Services       Precautions / Restrictions Precautions Precautions: Fall Precaution Comments: watch O2 Restrictions Weight Bearing Restrictions: No     Mobility  Bed Mobility                    Transfers Overall transfer level: Needs assistance Equipment used: None Transfers: Sit to/from Stand Sit to Stand: Supervision                 Ambulation/Gait Ambulation/Gait assistance: Supervision, Min guard Gait Distance (Feet): 500 Feet Assistive device: None Gait Pattern/deviations: Step-through pattern, Decreased stride length, Trunk flexed Gait velocity: decr     General Gait Details: cues for upright posture, navigating hallway. x1 bout LOB, pt-corrected. Spo2 92% and greater on 6LO2 during gait, pt dropped to 83% post-gait during seated recovery. x2 minutes with breathing technique cues and rest pt recovered to >90%.   Stairs             Wheelchair Mobility    Modified [name] (Stroke Patients Only)       Balance Overall balance assessment: Needs assistance Sitting-balance support: Feet supported, No upper extremity supported Sitting balance-Leahy Scale: Normal     Standing balance support: During functional activity Standing balance-Leahy Scale: Good Standing balance comment: no AD use/no assist                            Cognition Arousal/Alertness: Awake/alert Behavior During Therapy: WFL for tasks assessed/performed Overall Cognitive Status: Within Functional Limits for tasks assessed                                 General Comments: hx of memory loss        Exercises      General Comments        Pertinent Vitals/Pain Pain Assessment Pain Assessment: No/denies pain    Home Living  Prior Function            PT Goals (current goals can now be found in the care plan section) Acute Rehab PT Goals Patient Stated Goal: Go home, start cardiopulmonary rehab PT Goal Formulation: With patient Time For Goal Achievement: 09/18/22 Potential to Achieve Goals: Good Progress towards PT goals: Progressing toward goals    Frequency    Min 3X/week      PT Plan Current plan remains appropriate    Co-evaluation              AM-PAC PT "6 Clicks" Mobility   Outcome Measure  Help needed turning from your back to  your side while in a flat bed without using bedrails?: A Little Help needed moving from lying on your back to sitting on the side of a flat bed without using bedrails?: A Little Help needed moving to and from a bed to a chair (including a wheelchair)?: A Little Help needed standing up from a chair using your arms (e.g., wheelchair or bedside chair)?: A Little Help needed to walk in hospital room?: A Little Help needed climbing 3-5 steps with a railing? : A Little 6 Click Score: 18    End of Session Equipment Utilized During Treatment: Oxygen Activity Tolerance: Patient tolerated treatment well Patient left: in chair;with call bell/phone within reach;with family/visitor present;Other (comment) (no chair alarm noted in room, RN notified and will check) Nurse Communication: Mobility status PT Visit Diagnosis: History of falling (Z91.81);Difficulty in walking, not elsewhere classified (R26.2)     Time: 1610-1630 PT Time Calculation (min) (ACUTE ONLY): 20 min  Charges:  $Gait Training: 8-22 mins                     Stacie Glaze, PT DPT Acute Rehabilitation Services Pager [phone]  Office [phone]    [place] 09/14/2022, 5:05 PM

## 2022-09-14 NOTE — Plan of Care (Signed)
  Problem: Nutrition: Goal: Adequate nutrition will be maintained Outcome: Completed/Met   Problem: Elimination: Goal: Will not experience complications related to bowel motility Outcome: Completed/Met Goal: Will not experience complications related to urinary retention Outcome: Completed/Met   Problem: Pain Managment: Goal: General experience of comfort will improve Outcome: Completed/Met   

## 2022-09-14 NOTE — Progress Notes (Signed)
PROGRESS NOTE    Frank Meyer  ONG:[id number] DOB: 09-06-1940 DOA: 09/10/2022 PCP: Burnard Bunting, MD    Brief Narrative:  82 year old gentleman with history of COPD and chronic hypoxic respiratory failure on 5 L of oxygen at home, chronic diastolic heart failure, history of seizures presented to the emergency department for low oxygen saturation, an episode of fall at home.  According to the patient, early morning yesterday he fell while coming back from bathroom and not sure what happened.  Recently with increasing oxygen requirement and exertional dyspnea.  In the emergency room he was afebrile.  He was saturating 70% on 5 L of oxygen, he was tachypneic and tachycardic with systolic blood pressure 88.  EKG with sinus cardia.  Chest x-ray with interstitial changes mostly chronic.  Creatinine 1.65.  D-dimer 0.55.  Lactic acid 3.6.  Blood cultures were collected.  Patient was started on IV steroids and albuterol therapy and admitted to the hospital.   Assessment & Plan:   Acute hypoxic respiratory failure.  In a patient with history of COPD. He is been treated with steroids, continues to have significant hypoxic respiratory failure requiring 1 to 8 L of oxygen at rest, at this time I think most of his symptoms are due to acute on chronic diastolic CHF, he has orthopnea and crackles, BNP is elevated.  Will initiate moderate dose IV Lasix this morning, continue to taper steroids rapidly, add azithromycin for atypical coverage do not think he has pneumonia.  Encouraged to sit up in chair in daytime use I-S and flutter valve for pulmonary toiletry.  Advance activity and titrate down oxygen as tolerated.  Improving with diuresis continue.  Acute on chronic chronic heart failure EF 60% on recent echocardiogram.  Treatment as in above continue home Entresto.  Patient follow-up with cardiology postdischarge.  Acute kidney injury: Treated with IV fluids.  Renal functions back to baseline.  Discontinue  further IV fluids.  History of seizure, on Keppra  Memory loss, on Aricept.  Delirium precautions.  Fall precautions.   DVT prophylaxis: heparin injection 5,000 Units Start: 09/10/22 2200   Code Status: Full code Family Communication: Wife on the speaker phone. Disposition Plan: Status is: Inpatient Remains inpatient appropriate because: Significant shortness of breath, still on significant oxygen.     Consultants:  None  Procedures:  None     Subjective:   Patient in bed, appears comfortable, denies any headache, no fever, no chest pain or pressure, has some orthopnea and exertional shortness of breath , no abdominal pain. No new focal weakness.    Objective: Vitals:   09/13/22 2022 09/14/22 0034 09/14/22 0456 09/14/22 0940  BP:  120/62 111/62   Pulse:  (!) 55 (!) 47   Resp:  18 15   Temp:  97.6 F (36.4 C) 97.6 F (36.4 C)   TempSrc:  Oral Oral   SpO2: 95% 95% 91% 93%  Weight:   77.6 kg   Height:        Intake/Output Summary (Last 24 hours) at 09/14/2022 0948 Last data filed at 09/14/2022 0600 Gross per 24 hour  Intake 920 ml  Output 3005 ml  Net -2085 ml   Filed Weights   09/12/22 0500 09/13/22 0348 09/14/22 0456  Weight: 79.1 kg 78.7 kg 77.6 kg    Examination:  Awake Alert, No new F.N deficits, Normal affect [place].AT,PERRAL Supple Neck, No JVD,   Symmetrical Chest wall movement, Good air movement bilaterally, fine basilar Rales RRR,No Gallops, Rubs or new  Murmurs,  +ve B.Sounds, Abd Soft, No tenderness,   No Cyanosis, Clubbing or edema      Data Reviewed: I have personally reviewed following labs and imaging studies  Recent Labs  Lab 09/10/22 1743 09/11/22 0209 09/12/22 0018 09/13/22 0031 09/14/22 0042  WBC 14.4* 7.0 20.0* 18.3* 15.6*  HGB 12.4* 11.3* 10.5* 10.0* 10.7*  HCT 40.1 35.3* 32.4* 30.5* 34.5*  PLT 267 222 239 253 272  MCV 87.9 87.4 85.3 84.7 86.3  MCH 27.2 28.0 27.6 27.8 26.8  MCHC 30.9 32.0 32.4 32.8 31.0  RDW 16.6* 16.3*  16.3* 16.4* 16.5*  LYMPHSABS 5.0*  --   --   --  2.5  MONOABS 1.4*  --   --   --  1.1*  EOSABS 0.3  --   --   --  0.0  BASOSABS 0.1  --   --   --  0.1    Recent Labs  Lab 09/10/22 1743 09/10/22 1759 09/10/22 2006 09/11/22 0209 09/12/22 0018 09/13/22 0031 09/13/22 0642 09/14/22 0042  NA 136  --   --  138 137 139  --  138  K 4.1  --   --  3.9 4.0 3.9  --  4.2  CL 104  --   --  106 107 109  --  105  CO2 19*  --   --  21* 23 26  --  26  ANIONGAP 13  --   --  11 7 4*  --  7  GLUCOSE 137*  --   --  234* 168* 144*  --  119*  BUN 19  --   --  '16 19 20  '$ --  20  CREATININE 1.65*  --   --  1.19 1.04 0.98  --  1.12  AST 23  --   --   --   --   --   --   --   ALT 20  --   --   --   --   --   --   --   ALKPHOS 44  --   --   --   --   --   --   --   BILITOT 1.0  --   --   --   --   --   --   --   ALBUMIN 2.8*  --   --   --   --   --   --   --   CRP  --   --   --   --   --   --  2.4* 2.0*  DDIMER 0.55*  --   --   --   --   --   --   --   PROCALCITON  --   --   --   --   --   --  <0.10 <0.10  LATICACIDVEN  --  3.6* 1.5  --   --   --   --   --   BNP 254.8*  --   --   --   --   --  618.8* 250.1*  MG  --   --   --   --   --   --  2.4 2.3  CALCIUM 8.4*  --   --  7.8* 7.7* 7.7*  --  7.9*      Recent Labs  Lab 09/10/22 1743 09/10/22 1759 09/10/22 2006 09/11/22 6387 09/12/22 0018 09/13/22 0031 09/13/22 5643 09/14/22 3295  CRP  --   --   --   --   --   --  2.4* 2.0*  DDIMER 0.55*  --   --   --   --   --   --   --   PROCALCITON  --   --   --   --   --   --  <0.10 <0.10  LATICACIDVEN  --  3.6* 1.5  --   --   --   --   --   BNP 254.8*  --   --   --   --   --  618.8* 250.1*  MG  --   --   --   --   --   --  2.4 2.3  CALCIUM 8.4*  --   --  7.8* 7.7* 7.7*  --  7.9*    Radiology Studies: DG Chest Port 1 View  Result Date: 09/13/2022 CLINICAL DATA:  82 year old male with shortness of breath. EXAM: PORTABLE CHEST 1 VIEW COMPARISON:  Portable chest 09/10/2022 and earlier. FINDINGS:  Portable AP upright view at 0744 hours. 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 Stable lung volumes and mediastinal contours. Unchanged lung markings from 09/10/2022. No pneumothorax, pleural effusion or consolidation. Decreased bowel gas in the visible abdomen. Chronic epigastric and left upper quadrant surgical clips. Stable visualized osseous structures. 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 No superimposed acute findings are identified. Electronically Signed   By: [name]   On: 09/13/2022 07:59     Scheduled Meds:  aspirin EC  81 mg Oral Daily   atorvastatin  20 mg Oral QPM   azithromycin  500 mg Oral Daily   cholecalciferol  1,000 Units Oral Daily   donepezil  10 mg Oral QHS   finasteride  5 mg Oral QHS   heparin  5,000 Units Subcutaneous Q8H   ipratropium-albuterol  3 mL Nebulization BID   levETIRAcetam  250 mg Oral q AM   levETIRAcetam  500 mg Oral QHS   pantoprazole  40 mg Oral Daily   [START ON 09/15/2022] predniSONE  20 mg Oral Q breakfast   sacubitril-valsartan  1 tablet Oral QPM   sodium chloride flush  3 mL Intravenous Q12H   spironolactone  25 mg Oral Once   thiamine  100 mg Oral Daily   traZODone  200 mg Oral QHS   Continuous Infusions:  cefTRIAXone (ROCEPHIN)  IV 1 g (09/13/22 2204)     LOS: 4 days   Signature  -    Lala Lund M.D on 09/14/2022 at 9:48 AM   -  To page go to [email or web address]

## 2022-09-14 NOTE — Plan of Care (Signed)
  Problem: Education: Goal: Knowledge of disease or condition will improve Outcome: Progressing Goal: Knowledge of the prescribed therapeutic regimen will improve Outcome: Progressing Goal: Individualized Educational Video(s) Outcome: Progressing   Problem: Activity: Goal: Ability to tolerate increased activity will improve Outcome: Progressing Goal: Will verbalize the importance of balancing activity with adequate rest periods Outcome: Progressing   Problem: Respiratory: Goal: Ability to maintain a clear airway will improve Outcome: Progressing Goal: Levels of oxygenation will improve Outcome: Progressing Goal: Ability to maintain adequate ventilation will improve Outcome: Progressing   Problem: Education: Goal: Knowledge of General Education information will improve Description: Including pain rating scale, medication(s)/side effects and non-pharmacologic comfort measures Outcome: Progressing   Problem: Health Behavior/Discharge Planning: Goal: Ability to manage health-related needs will improve Outcome: Progressing   Problem: Clinical Measurements: Goal: Ability to maintain clinical measurements within normal limits will improve Outcome: Progressing Goal: Will remain free from infection Outcome: Progressing Goal: Diagnostic test results will improve Outcome: Progressing Goal: Respiratory complications will improve Outcome: Progressing Goal: Cardiovascular complication will be avoided Outcome: Progressing   Problem: Activity: Goal: Risk for activity intolerance will decrease Outcome: Progressing   Problem: Coping: Goal: Level of anxiety will decrease Outcome: Progressing   Problem: Safety: Goal: Ability to remain free from injury will improve Outcome: Progressing   Problem: Skin Integrity: Goal: Risk for impaired skin integrity will decrease Outcome: Progressing

## 2022-09-14 NOTE — Telephone Encounter (Signed)
PT calling. He is in Hospital and wonders if Dr. Lemmie Evens would need to know. COPD Exacerbation. He should be released in a couple days. Pls advise Dr. Lemmie Evens. Call PT if needed.

## 2022-09-14 NOTE — Care Management Important Message (Signed)
Important Message  Patient Details  Name: Frank Meyer MRN: [id number] Date of Birth: September 14, 1940   Medicare Important Message Given:  Yes     Shelda Altes 09/14/2022, 10:18 AM

## 2022-09-15 ENCOUNTER — Inpatient Hospital Stay (HOSPITAL_COMMUNITY): Payer: Medicare Other

## 2022-09-15 DIAGNOSIS — J9621 Acute and chronic respiratory failure with hypoxia: Secondary | ICD-10-CM | POA: Diagnosis not present

## 2022-09-15 LAB — CBC WITH DIFFERENTIAL/PLATELET
Abs Immature Granulocytes: 1.15 10*3/uL — ABNORMAL HIGH (ref 0.00–0.07)
Basophils Absolute: 0 10*3/uL (ref 0.0–0.1)
Basophils Relative: 0 %
Eosinophils Absolute: 0.4 10*3/uL (ref 0.0–0.5)
Eosinophils Relative: 3 %
HCT: 35.5 % — ABNORMAL LOW (ref 39.0–52.0)
Hemoglobin: 10.9 g/dL — ABNORMAL LOW (ref 13.0–17.0)
Immature Granulocytes: 9 %
Lymphocytes Relative: 27 %
Lymphs Abs: 3.5 10*3/uL (ref 0.7–4.0)
MCH: 26.4 pg (ref 26.0–34.0)
MCHC: 30.7 g/dL (ref 30.0–36.0)
MCV: 86 fL (ref 80.0–100.0)
Monocytes Absolute: 1 10*3/uL (ref 0.1–1.0)
Monocytes Relative: 8 %
Neutro Abs: 6.9 10*3/uL (ref 1.7–7.7)
Neutrophils Relative %: 53 %
Platelets: 280 10*3/uL (ref 150–400)
RBC: 4.13 MIL/uL — ABNORMAL LOW (ref 4.22–5.81)
RDW: 16.4 % — ABNORMAL HIGH (ref 11.5–15.5)
WBC: 13 10*3/uL — ABNORMAL HIGH (ref 4.0–10.5)
nRBC: 0 % (ref 0.0–0.2)

## 2022-09-15 LAB — CULTURE, BLOOD (ROUTINE X 2)
Culture: NO GROWTH
Culture: NO GROWTH
Special Requests: ADEQUATE
Special Requests: ADEQUATE

## 2022-09-15 LAB — BRAIN NATRIURETIC PEPTIDE: B Natriuretic Peptide: 111.2 pg/mL — ABNORMAL HIGH (ref 0.0–100.0)

## 2022-09-15 LAB — BASIC METABOLIC PANEL
Anion gap: 7 (ref 5–15)
BUN: 25 mg/dL — ABNORMAL HIGH (ref 8–23)
CO2: 28 mmol/L (ref 22–32)
Calcium: 8.4 mg/dL — ABNORMAL LOW (ref 8.9–10.3)
Chloride: 104 mmol/L (ref 98–111)
Creatinine, Ser: 1.08 mg/dL (ref 0.61–1.24)
GFR, Estimated: >60 mL/min (ref >60)
Glucose, Bld: 99 mg/dL (ref 70–99)
Potassium: 3.9 mmol/L (ref 3.5–5.1)
Sodium: 139 mmol/L (ref 135–145)

## 2022-09-15 LAB — MAGNESIUM: Magnesium: 2.3 mg/dL (ref 1.7–2.4)

## 2022-09-15 LAB — C-REACTIVE PROTEIN: CRP: 1.3 mg/dL — ABNORMAL HIGH (ref <1.0)

## 2022-09-15 LAB — PROCALCITONIN: Procalcitonin: <0.1 ng/mL

## 2022-09-15 MED ORDER — FUROSEMIDE 10 MG/ML IJ SOLN
40.0000 mg | Freq: Once | INTRAMUSCULAR | Status: AC
  Administered 2022-09-15: 40 mg via INTRAVENOUS
  Filled 2022-09-15: qty 4

## 2022-09-15 MED ORDER — SPIRONOLACTONE 12.5 MG HALF TABLET
12.5000 mg | ORAL_TABLET | Freq: Once | ORAL | Status: AC
  Administered 2022-09-15: 12.5 mg via ORAL
  Filled 2022-09-15: qty 1

## 2022-09-15 MED ORDER — POTASSIUM CHLORIDE CRYS ER 20 MEQ PO TBCR
20.0000 meq | EXTENDED_RELEASE_TABLET | Freq: Once | ORAL | Status: AC
  Administered 2022-09-15: 20 meq via ORAL
  Filled 2022-09-15: qty 1

## 2022-09-15 NOTE — Progress Notes (Signed)
   09/15/22 1100  Mobility  Activity Ambulated independently in hallway  Level of Assistance Independent  Assistive Device None  Distance Ambulated (ft) 550 ft  Activity Response Tolerated well  Mobility Referral Yes  $Mobility charge 1 Mobility   Mobility Specialist Progress Note  Pre-Mobility: 95% SpO2 During Mobility: 87-93% SpO2 Post-Mobility: 92% SpO2  Pt was in bed and agreeable. Had no c/on pain throughout. Left in chair w/ all needs met and call bell in reach.   Lucious Groves Mobility Specialist  Please contact via [place] or Rehab office at [phone]

## 2022-09-15 NOTE — Progress Notes (Signed)
PROGRESS NOTE    Frank Meyer  LZJ:[id number] DOB: 1940/09/14 DOA: 09/10/2022 PCP: Burnard Bunting, MD    Brief Narrative:  82 year old gentleman with history of COPD and chronic hypoxic respiratory failure on 5 L of oxygen at home, chronic diastolic heart failure, history of seizures presented to the emergency department for low oxygen saturation, an episode of fall at home.  According to the patient, early morning yesterday he fell while coming back from bathroom and not sure what happened.  Recently with increasing oxygen requirement and exertional dyspnea.  In the emergency room he was afebrile.  He was saturating 70% on 5 L of oxygen, he was tachypneic and tachycardic with systolic blood pressure 88.  EKG with sinus cardia.  Chest x-ray with interstitial changes mostly chronic.  Creatinine 1.65.  D-dimer 0.55.  Lactic acid 3.6.  Blood cultures were collected.  Patient was started on IV steroids and albuterol therapy and admitted to the hospital.   Assessment & Plan:   Acute hypoxic respiratory failure.  In a patient with history of COPD. He is been treated with steroids, continues to have significant hypoxic respiratory failure requiring 1 to 8 L of oxygen at rest, at this time I think most of his symptoms are due to acute on chronic diastolic CHF, he has orthopnea and crackles, BNP is elevated.  Will initiate moderate dose IV Lasix this morning, continue to taper steroids rapidly, add azithromycin for atypical coverage do not think he has pneumonia.  Encouraged to sit up in chair in daytime use I-S and flutter valve for pulmonary toiletry.  Advance activity and titrate down oxygen as tolerated.  Improving with diuresis continue.  SpO2: 94 % O2 Flow Rate (L/min): 2 L/min  Acute on chronic chronic heart failure EF 60% on recent echocardiogram.  Treatment as in above continue home Entresto.  Patient follow-up with cardiology postdischarge.  Acute kidney injury: Treated with IV fluids.   Renal functions back to baseline.  Discontinue further IV fluids.  History of seizure, on Keppra  Memory loss, on Aricept.  Delirium precautions.  Fall precautions.   DVT prophylaxis: heparin injection 5,000 Units Start: 09/10/22 2200   Code Status: Full code Family Communication: Wife on the speaker phone. Disposition Plan: Status is: Inpatient Remains inpatient appropriate because: Significant shortness of breath, still on significant oxygen.     Consultants:  None  Procedures:  None     Subjective:   Patient in bed, appears comfortable, denies any headache, no fever, no chest pain or pressure, no shortness of breath , no abdominal pain. No new focal weakness.   Objective: Vitals:   09/14/22 2005 09/15/22 0035 09/15/22 0433 09/15/22 0817  BP: 104/65 (!) 95/54 107/69   Pulse: 63 60 (!) 54   Resp: '14 18 16   '$ Temp: (!) 97.5 F (36.4 C) 97.8 F (36.6 C) 98.1 F (36.7 C)   TempSrc: Oral Oral Oral   SpO2: 94% 95% 95% 94%  Weight:  76.2 kg    Height:        Intake/Output Summary (Last 24 hours) at 09/15/2022 0911 Last data filed at 09/15/2022 0433 Gross per 24 hour  Intake 920 ml  Output 1550 ml  Net -630 ml   Filed Weights   09/13/22 0348 09/14/22 0456 09/15/22 0035  Weight: 78.7 kg 77.6 kg 76.2 kg    Examination:  Awake Alert, No new F.N deficits, Normal affect .AT,PERRAL Supple Neck, No JVD,   Symmetrical Chest wall movement, Good air movement  bilaterally, few rales RRR,No Gallops, Rubs or new Murmurs,  +ve B.Sounds, Abd Soft, No tenderness,   No Cyanosis, Clubbing or edema     Data Reviewed: I have personally reviewed following labs and imaging studies  Recent Labs  Lab 09/10/22 1743 09/11/22 0209 09/12/22 0018 09/13/22 0031 09/14/22 0042 09/15/22 0059  WBC 14.4* 7.0 20.0* 18.3* 15.6* 13.0*  HGB 12.4* 11.3* 10.5* 10.0* 10.7* 10.9*  HCT 40.1 35.3* 32.4* 30.5* 34.5* 35.5*  PLT 267 222 239 253 272 280  MCV 87.9 87.4 85.3 84.7 86.3 86.0   MCH 27.2 28.0 27.6 27.8 26.8 26.4  MCHC 30.9 32.0 32.4 32.8 31.0 30.7  RDW 16.6* 16.3* 16.3* 16.4* 16.5* 16.4*  LYMPHSABS 5.0*  --   --   --  2.5 3.5  MONOABS 1.4*  --   --   --  1.1* 1.0  EOSABS 0.3  --   --   --  0.0 0.4  BASOSABS 0.1  --   --   --  0.1 0.0    Recent Labs  Lab 09/10/22 1743 09/10/22 1759 09/10/22 2006 09/11/22 0209 09/12/22 0018 09/13/22 0031 09/13/22 0642 09/14/22 0042 09/15/22 0059  NA 136  --   --  138 137 139  --  138 139  K 4.1  --   --  3.9 4.0 3.9  --  4.2 3.9  CL 104  --   --  106 107 109  --  105 104  CO2 19*  --   --  21* 23 26  --  26 28  ANIONGAP 13  --   --  11 7 4*  --  7 7  GLUCOSE 137*  --   --  234* 168* 144*  --  119* 99  BUN 19  --   --  '16 19 20  '$ --  20 25*  CREATININE 1.65*  --   --  1.19 1.04 0.98  --  1.12 1.08  AST 23  --   --   --   --   --   --   --   --   ALT 20  --   --   --   --   --   --   --   --   ALKPHOS 44  --   --   --   --   --   --   --   --   BILITOT 1.0  --   --   --   --   --   --   --   --   ALBUMIN 2.8*  --   --   --   --   --   --   --   --   CRP  --   --   --   --   --   --  2.4* 2.0* 1.3*  DDIMER 0.55*  --   --   --   --   --   --   --   --   PROCALCITON  --   --   --   --   --   --  <0.10 <0.10 <0.10  LATICACIDVEN  --  3.6* 1.5  --   --   --   --   --   --   BNP 254.8*  --   --   --   --   --  618.8* 250.1* 111.2*  MG  --   --   --   --   --   --  2.4 2.3 2.3  CALCIUM 8.4*  --   --  7.8* 7.7* 7.7*  --  7.9* 8.4*      Recent Labs  Lab 09/10/22 1743 09/10/22 1759 09/10/22 2006 09/11/22 0209 09/12/22 0018 09/13/22 0031 09/13/22 0642 09/14/22 0042 09/15/22 0059  CRP  --   --   --   --   --   --  2.4* 2.0* 1.3*  DDIMER 0.55*  --   --   --   --   --   --   --   --   PROCALCITON  --   --   --   --   --   --  <0.10 <0.10 <0.10  LATICACIDVEN  --  3.6* 1.5  --   --   --   --   --   --   BNP 254.8*  --   --   --   --   --  618.8* 250.1* 111.2*  MG  --   --   --   --   --   --  2.4 2.3 2.3  CALCIUM 8.4*   --   --  7.8* 7.7* 7.7*  --  7.9* 8.4*    Radiology Studies: DG Chest Port 1 View  Result Date: 09/15/2022 CLINICAL DATA:  Shortness of breath EXAM: PORTABLE CHEST 1 VIEW COMPARISON:  09/13/2022 FINDINGS: 0635 hours. The cardio pericardial silhouette is enlarged. Architectural distortion and scarring again noted in both lungs, similar to prior. Stable opacity in the right apex and peripheral left mid lung, likely scarring. The visualized bony structures of the thorax are unremarkable. Telemetry leads overlie the chest. IMPRESSION: 1. Stable exam. 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 Electronically Signed   By: Misty Stanley M.D.   On: 09/15/2022 07:21     Scheduled Meds:  aspirin EC  81 mg Oral Daily   atorvastatin  20 mg Oral QPM   azithromycin  500 mg Oral Daily   cholecalciferol  1,000 Units Oral Daily   donepezil  10 mg Oral QHS   finasteride  5 mg Oral QHS   furosemide  40 mg Intravenous Once   heparin  5,000 Units Subcutaneous Q8H   ipratropium-albuterol  3 mL Nebulization BID   levETIRAcetam  250 mg Oral q AM   levETIRAcetam  500 mg Oral QHS   pantoprazole  40 mg Oral Daily   potassium chloride  20 mEq Oral Once   sacubitril-valsartan  1 tablet Oral QPM   sodium chloride flush  3 mL Intravenous Q12H   spironolactone  12.5 mg Oral Once   thiamine  100 mg Oral Daily   traZODone  200 mg Oral QHS   Continuous Infusions:     LOS: 5 days   Signature  -    Lala Lund M.D on 09/15/2022 at 9:11 AM   -  To page go to [email or web address]

## 2022-09-15 NOTE — Plan of Care (Signed)
  Problem: Education: Goal: Knowledge of disease or condition will improve Outcome: Progressing Goal: Knowledge of the prescribed therapeutic regimen will improve Outcome: Progressing Goal: Individualized Educational Video(s) Outcome: Progressing   Problem: Activity: Goal: Ability to tolerate increased activity will improve Outcome: Progressing Goal: Will verbalize the importance of balancing activity with adequate rest periods Outcome: Progressing   Problem: Respiratory: Goal: Ability to maintain a clear airway will improve Outcome: Progressing Goal: Levels of oxygenation will improve Outcome: Progressing Goal: Ability to maintain adequate ventilation will improve Outcome: Progressing   Problem: Education: Goal: Knowledge of General Education information will improve Description: Including pain rating scale, medication(s)/side effects and non-pharmacologic comfort measures Outcome: Progressing   Problem: Health Behavior/Discharge Planning: Goal: Ability to manage health-related needs will improve Outcome: Progressing   Problem: Clinical Measurements: Goal: Ability to maintain clinical measurements within normal limits will improve Outcome: Progressing Goal: Will remain free from infection Outcome: Progressing Goal: Diagnostic test results will improve Outcome: Progressing Goal: Respiratory complications will improve Outcome: Progressing Goal: Cardiovascular complication will be avoided Outcome: Progressing   Problem: Activity: Goal: Risk for activity intolerance will decrease Outcome: Progressing   Problem: Coping: Goal: Level of anxiety will decrease Outcome: Progressing   Problem: Safety: Goal: Ability to remain free from injury will improve Outcome: Progressing   Problem: Skin Integrity: Goal: Risk for impaired skin integrity will decrease Outcome: Progressing

## 2022-09-16 ENCOUNTER — Other Ambulatory Visit (HOSPITAL_COMMUNITY): Payer: Self-pay

## 2022-09-16 DIAGNOSIS — J9621 Acute and chronic respiratory failure with hypoxia: Secondary | ICD-10-CM | POA: Diagnosis not present

## 2022-09-16 MED ORDER — POTASSIUM CHLORIDE CRYS ER 20 MEQ PO TBCR
20.0000 meq | EXTENDED_RELEASE_TABLET | Freq: Every day | ORAL | 0 refills | Status: DC
  Filled 2022-09-16: qty 30, 30d supply, fill #0

## 2022-09-16 MED ORDER — AZITHROMYCIN 250 MG PO TABS
250.0000 mg | ORAL_TABLET | Freq: Every day | ORAL | 0 refills | Status: DC
  Filled 2022-09-16: qty 2, 2d supply, fill #0

## 2022-09-16 MED ORDER — SACUBITRIL-VALSARTAN 24-26 MG PO TABS: 1.0000 | ORAL_TABLET | Freq: Every evening | ORAL | Status: DC

## 2022-09-16 MED ORDER — FUROSEMIDE 40 MG PO TABS
40.0000 mg | ORAL_TABLET | Freq: Every day | ORAL | 0 refills | Status: DC
  Filled 2022-09-16: qty 30, 30d supply, fill #0

## 2022-09-16 NOTE — Progress Notes (Signed)
   09/16/22 1026  Mobility  Activity Ambulated with assistance in room  Level of Assistance Independent after set-up  Assistive Device None  Distance Ambulated (ft) 5 ft  Activity Response Tolerated fair  Mobility Referral Yes  $Mobility charge 1 Mobility   Mobility Specialist Progress Note  Pre-Mobility: 94% SpO2 During Mobility: 79-85% SpO2 Post-Mobility: 90% SpO2  Pt was in bed and agreeable. Mobility cut short d/t pt feeling SOB d/t "talking". Left in chair w/ all needs met and call bell in reach.   Lucious Groves Mobility Specialist  Please contact via SecureChat or Rehab office at [phone]

## 2022-09-16 NOTE — Discharge Instructions (Signed)
Follow with Primary MD Burnard Bunting, MD in 3-5 days   Get CBC, CMP, Magnesium, 2 view Chest X ray -  checked next visit with your primary MD    Activity: As tolerated with Full fall precautions use walker/cane & assistance as needed  Disposition Home   Diet: Heart Healthy.  Check your Weight same time everyday, if you gain over 2 pounds, or you develop in leg swelling, experience more shortness of breath or chest pain, call your Primary MD immediately. Follow Cardiac Low Salt Diet and 1.5 lit/day fluid restriction.  Special Instructions: If you have smoked or chewed Tobacco  in the last 2 yrs please stop smoking, stop any regular Alcohol  and or any Recreational drug use.  On your next visit with your primary care physician please Get Medicines reviewed and adjusted.  Please request your Prim.MD to go over all Hospital Tests and Procedure/Radiological results at the follow up, please get all Hospital records sent to your Prim MD by signing hospital release before you go home.  If you experience worsening of your admission symptoms, develop shortness of breath, life threatening emergency, suicidal or homicidal thoughts you must seek medical attention immediately by calling 911 or calling your MD immediately  if symptoms less severe.  You Must read complete instructions/literature along with all the possible adverse reactions/side effects for all the Medicines you take and that have been prescribed to you. Take any new Medicines after you have completely understood and accpet all the possible adverse reactions/side effects.

## 2022-09-16 NOTE — Discharge Summary (Signed)
Frank Meyer ATF:[id number] DOB: April 06, 1941 DOA: 09/10/2022  PCP: Burnard Bunting, MD  Admit date: 09/10/2022  Discharge date: 09/16/2022  Admitted From: Home   Disposition:  Home   Recommendations for Outpatient Follow-up:   Follow up with PCP in 1-2 weeks  PCP Please obtain BMP/CBC, 2 view CXR in 1week,  (see Discharge instructions)   PCP Please follow up on the following pending results: Kindly monitor blood pressure, BMP and diuretic dose closely   Home Health: None   Equipment/Devices: None  Consultations: None  Discharge Condition: Stable    CODE STATUS: Full    Diet Recommendation: Heart Healthy with strict 1.5 L fluid restriction per day    Chief Complaint  Patient presents with   Shortness of Breath     Brief history of present illness from the day of admission and additional interim summary    82 year old gentleman with history of COPD and chronic hypoxic respiratory failure on 5 L of oxygen at home, chronic diastolic heart failure, history of seizures presented to the emergency department for low oxygen saturation, an episode of fall at home. According to the patient, early morning yesterday he fell while coming back from bathroom and not sure what happened. Recently with increasing oxygen requirement and exertional dyspnea.  Was diagnosed initially with COPD exacerbation and there after CHF exacerbation and admitted to the hospital.                                                                 Hospital Course   Acute hypoxic respiratory failure.  In a patient with history of COPD. He is been treated with steroids, continues to have significant hypoxic respiratory failure requiring 1 to 8 L of oxygen at rest, at this time I think most of his symptoms are due to acute on chronic diastolic CHF,  he has orthopnea and crackles, BNP is elevated.  Will initiate moderate dose IV Lasix this morning, continue to taper steroids rapidly, add azithromycin for atypical coverage do not think he has pneumonia.  Encouraged to sit up in chair in daytime use I-S and flutter valve for pulmonary toiletry.  Significantly improved after diuresis of around 4-1/2 to 5 L of fluid in the last 3 days, now better than baseline, he uses 5 L of nasal cannula oxygen at home currently on 2 L and symptom-free.    Will be discharged home on low-dose diuretic, has finished his steroid taper, will finish his antibiotic course in 2 more days will have him follow-up with PCP on a close basis postdischarge to follow his blood pressure, BMP and diuretic dose.  He will benefit from outpatient cardiology follow-up for acute on chronic diastolic CHF management.   SpO2: 94 % O2 Flow Rate (L/min): 2 L/min   Acute on chronic chronic  heart failure EF 60% on recent echocardiogram.  Treatment as in above continue home Entresto.  Patient follow-up with cardiology postdischarge.   Acute kidney injury: Completely resolved after treatment in the hospital initially required some fluids.   History of seizure, on Keppra   Memory loss, on Aricept.  No acute issues in the hospital.     Discharge diagnosis     Principal Problem:   Acute and chronic respiratory failure with hypoxia (HCC) Active Problems:   Memory loss   Chronic diastolic CHF (congestive heart failure) (HCC)   Acute exacerbation of chronic obstructive pulmonary disease (COPD) (HCC)   Elevated troponin   History of seizures   AKI (acute kidney injury) ([place])   COPD with acute exacerbation [facility])    Discharge instructions    Discharge Instructions     Discharge instructions   Complete by: As directed    Follow with Primary MD Burnard Bunting, MD in 3-5 days   Get CBC, CMP, Magnesium, 2 view Chest X ray -  checked next visit with your primary MD    Activity: As  tolerated with Full fall precautions use walker/cane & assistance as needed  Disposition Home   Diet: Heart Healthy.  Check your Weight same time everyday, if you gain over 2 pounds, or you develop in leg swelling, experience more shortness of breath or chest pain, call your Primary MD immediately. Follow Cardiac Low Salt Diet and 1.5 lit/day fluid restriction.  Special Instructions: If you have smoked or chewed Tobacco  in the last 2 yrs please stop smoking, stop any regular Alcohol  and or any Recreational drug use.  On your next visit with your primary care physician please Get Medicines reviewed and adjusted.  Please request your Prim.MD to go over all Hospital Tests and Procedure/Radiological results at the follow up, please get all Hospital records sent to your Prim MD by signing hospital release before you go home.  If you experience worsening of your admission symptoms, develop shortness of breath, life threatening emergency, suicidal or homicidal thoughts you must seek medical attention immediately by calling 911 or calling your MD immediately  if symptoms less severe.  You Must read complete instructions/literature along with all the possible adverse reactions/side effects for all the Medicines you take and that have been prescribed to you. Take any new Medicines after you have completely understood and accpet all the possible adverse reactions/side effects.   Increase activity slowly   Complete by: As directed        Discharge Medications   Allergies as of 09/16/2022   No Known Allergies      Medication List     STOP taking these medications    predniSONE 10 MG tablet Commonly known as: DELTASONE       TAKE these medications    Anoro Ellipta 62.5-25 MCG/ACT Aepb Generic drug: umeclidinium-vilanterol Inhale 1 puff into the lungs daily.   aspirin EC 81 MG tablet Take 81 mg by mouth daily.   atorvastatin 20 MG tablet Commonly known as: LIPITOR Take 20 mg by  mouth every evening.   azithromycin 250 MG tablet Commonly known as: Zithromax Take 1 tablet (250 mg total) by mouth daily.   cholecalciferol 1000 units tablet Commonly known as: VITAMIN D Take 1,000 Units by mouth daily.   donepezil 10 MG tablet Commonly known as: ARICEPT Take 1 tablet (10 mg total) by mouth daily. What changed: when to take this   Entresto 24-26 MG Generic drug: sacubitril-valsartan Take  1 tablet by mouth daily. What changed: when to take this   fexofenadine 180 MG tablet Commonly known as: ALLEGRA Take 180 mg by mouth daily.   finasteride 5 MG tablet Commonly known as: PROSCAR Take 5 mg by mouth at bedtime.   furosemide 40 MG tablet Commonly known as: Lasix Take 1 tablet (40 mg total) by mouth daily.   ipratropium 0.06 % nasal spray Commonly known as: ATROVENT Place 1-2 sprays into both nostrils See admin instructions. 2 sprays in the right nostril and 1 spray in the left nostril once daily   ipratropium-albuterol 0.5-2.5 (3) MG/3ML Soln Commonly known as: DUONEB Take 3 mLs by nebulization 2 (two) times daily.   levETIRAcetam 250 MG tablet Commonly known as: Keppra Take 1-2 tablets (250-500 mg total) by mouth See admin instructions. Take 250 mg by mouth in the morning and 500 mg by mouth at night. What changed: additional instructions   Melatonin 10 MG Tabs Take 20 mg by mouth at bedtime.   Mucus Relief DM 30-600 MG Tb12 Take 1 tablet by mouth 2 (two) times daily.   nitroGLYCERIN 0.4 MG SL tablet Commonly known as: NITROSTAT Place 1 tablet (0.4 mg total) under the tongue every 5 (five) minutes as needed. Chest pain What changed: reasons to take this   omeprazole 40 MG capsule Commonly known as: PRILOSEC Take 40 mg by mouth daily.   OXYGEN Inhale 2-5 L into the lungs as directed. 5lt during the day and 2lt at night   Potassium Chloride ER 20 MEQ Tbcr Take 1 tablet (20 mEq total) by mouth daily.   thiamine 100 MG tablet Commonly  known as: VITAMIN B1 Take 1 tablet (100 mg total) by mouth daily.   traZODone 100 MG tablet Commonly known as: DESYREL TAKE 2 TABLETS BY MOUTH AT  BEDTIME               Durable Medical Equipment  (From admission, onward)           Start     Ordered   09/15/22 0912  For home use only DME oxygen  Once       Question Answer Comment  Length of Need 6 Months   Mode or (Route) Nasal cannula   Liters per Minute 2   Frequency Continuous (stationary and portable oxygen unit needed)   Oxygen conserving device Yes   Oxygen delivery system Gas      09/15/22 0911             Follow-up Information     Burnard Bunting, MD. Schedule an appointment as soon as possible for a visit in 3 day(s).   Specialty: Internal Medicine Contact information: [place] [address] [phone]         Adrian Prows, MD. Schedule an appointment as soon as possible for a visit in 1 week(s).   Specialty: Cardiology Why: CHF Contact information: [place] [address] [phone]                 Major procedures and Radiology Reports - PLEASE review detailed and final reports thoroughly  -      DG Chest Port 1 View  Result Date: 09/15/2022 CLINICAL DATA:  Shortness of breath EXAM: PORTABLE CHEST 1 VIEW COMPARISON:  09/13/2022 FINDINGS: 0635 hours. The cardio pericardial silhouette is enlarged. Architectural distortion and scarring again noted in both lungs, similar to prior. Stable opacity in the right apex and peripheral left mid  lung, likely scarring. The visualized bony structures of the thorax are unremarkable. Telemetry leads overlie the chest. IMPRESSION: 1. Stable exam. 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 Electronically Signed   By: Misty Stanley M.D.   On: 09/15/2022 07:21   DG Chest Port 1 View  Result Date: 09/13/2022 CLINICAL DATA:  82 year old male with shortness of breath. EXAM:  PORTABLE CHEST 1 VIEW COMPARISON:  Portable chest 09/10/2022 and earlier. FINDINGS: Portable AP upright view at 0744 hours. 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 Stable lung volumes and mediastinal contours. Unchanged lung markings from 09/10/2022. No pneumothorax, pleural effusion or consolidation. Decreased bowel gas in the visible abdomen. Chronic epigastric and left upper quadrant surgical clips. Stable visualized osseous structures. 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 No superimposed acute findings are identified. Electronically Signed   By: [name]   On: 09/13/2022 07:59   US RENAL  Result Date: 09/10/2022 CLINICAL DATA:  Acute kidney injury. EXAM: RENAL / URINARY TRACT ULTRASOUND COMPLETE COMPARISON:  None Available. FINDINGS: Right Kidney: Renal measurements: 9.1 x 4.6 x 4.9 cm = volume: 107 mL. Echogenicity within normal limits. No mass or hydronephrosis visualized. Left Kidney: Renal measurements: 10.3 x 5.7 x 4.0 cm = volume: 122 mL. Echogenicity within normal limits. No mass or hydronephrosis visualized. Bladder: Appears normal for degree of bladder distention. Other: None. IMPRESSION: Normal renal ultrasound. Electronically Signed   By: Ronney Asters M.D.   On: 09/10/2022 22:14   DG Chest Portable 1 View  Result Date: 09/10/2022 CLINICAL DATA:  Shortness of breath EXAM: PORTABLE CHEST 1 VIEW COMPARISON:  08/02/2022 x-ray and older.  CT angiogram 07/20/2022 FINDINGS: Underinflation. Enlarged cardiopericardial silhouette. Diffuse interstitial changes and other chronic lung changes. Similar to previous. Question tiny right effusion. No pneumothorax. Overlapping cardiac leads. Apical pleural thickening on the right. IMPRESSION: Underinflation with interstitial changes, possibly chronic. Small right pleural effusion. Enlarged heart. Appearance is similar to previous Electronically  Signed   By: Jill Side M.D.   On: 09/10/2022 18:13   Intravitreal Injection, Pharmacologic Agent - OS - Left Eye  Result Date: 09/07/2022 Time Out 09/07/2022. 2:41 PM. Confirmed correct patient, procedure, site, and patient consented. Anesthesia Topical anesthesia was used. Anesthetic medications included Lidocaine 2%, Proparacaine 0.5%. Procedure Preparation included 5% betadine to ocular surface, eyelid speculum. A (32g) needle was used. Injection: 1.25 mg Bevacizumab 1.'25mg'$ /0.24m   Route: Intravitreal, Site: Left Eye   NDC: [id number] Lot:: [id number] Expiration date: 10/16/2022 Post-op Post injection exam found visual acuity of at least counting fingers. The patient tolerated the procedure well. There were no complications. The patient received written and verbal post procedure care education.   OCT, Retina - OU - Both Eyes  Result Date: 09/07/2022 Right Eye Quality was borderline. Central Foveal Thickness: 307. Progression has no prior data. Findings include normal foveal contour, no IRF, no SRF, retinal drusen , outer retinal atrophy (Focal ORA temporal macula, no fluid or edema). Left Eye Quality was good. Central Foveal Thickness: 290. Progression has no prior data. Findings include normal foveal contour, no IRF, no SRF, retinal drusen , subretinal hyper-reflective material, pigment epithelial detachment (Low PED/SRHM centrally, trace cystic changes overlying ). Notes *Images captured and stored on drive Diagnosis / Impression: OD: Focal ORA temporal macula, no fluid or edema OS: Low PED/SRHM centrally, trace cystic changes overlying Clinical management: See below Abbreviations: NFP - Normal foveal profile. CME - cystoid macular edema. PED -  pigment epithelial detachment. IRF - intraretinal fluid. SRF - subretinal fluid. EZ - ellipsoid zone. ERM - epiretinal membrane. ORA - outer retinal atrophy. ORT - outer retinal tubulation. SRHM - subretinal hyper-reflective material. IRHM - intraretinal  hyper-reflective material    Micro Results    Recent Results (from the past 240 hour(s))  Blood culture (routine x 2)     Status: None   Collection Time: 09/10/22  5:59 PM   Specimen: BLOOD RIGHT FOREARM  Result Value Ref Range Status   Specimen Description BLOOD RIGHT FOREARM  Final   Special Requests   Final    BOTTLES DRAWN AEROBIC AND ANAEROBIC Blood Culture adequate volume   Culture   Final    NO GROWTH 5 DAYS Performed at [facility], [place] [address] [place], [place] [address]    Report Status 09/15/2022 FINAL  Final  Blood culture (routine x 2)     Status: None   Collection Time: 09/10/22  6:04 PM   Specimen: BLOOD LEFT FOREARM  Result Value Ref Range Status   Specimen Description BLOOD LEFT FOREARM  Final   Special Requests   Final    BOTTLES DRAWN AEROBIC AND ANAEROBIC Blood Culture adequate volume   Culture   Final    NO GROWTH 5 DAYS Performed at [facility], [place] [address], [place], [place] [address]    Report Status 09/15/2022 FINAL  Final  Resp panel by RT-PCR (RSV, Flu A&B, Covid) Anterior Nasal Swab     Status: None   Collection Time: 09/10/22  6:08 PM   Specimen: Anterior Nasal Swab  Result Value Ref Range Status   SARS Coronavirus 2 by RT PCR NEGATIVE NEGATIVE Final    Comment: (NOTE) SARS-CoV-2 target nucleic acids are NOT DETECTED.  The SARS-CoV-2 RNA is generally detectable in upper respiratory specimens during the acute phase of infection. The lowest concentration of SARS-CoV-2 viral copies this assay can detect is 138 copies/mL. A negative result does not preclude SARS-Cov-2 infection and should not be used as the sole basis for treatment or other patient management decisions. A negative result may occur with  improper specimen collection/handling, submission of specimen other than nasopharyngeal swab, presence of viral mutation(s) within the areas targeted by this assay, and inadequate number of viral copies(<138 copies/mL). A  negative result must be combined with clinical observations, patient history, and epidemiological information. The expected result is Negative.  Fact Sheet for Patients:  EntrepreneurPulse.com.au  Fact Sheet for Healthcare Providers:  IncredibleEmployment.be  This test is no t yet approved or cleared by the [place] FDA and  has been authorized for detection and/or diagnosis of SARS-CoV-2 by FDA under an Emergency Use Authorization (EUA). This EUA will remain  in effect (meaning this test can be used) for the duration of the COVID-19 declaration under Section 564(b)(1) of the Act, 21 U.S.C.section 360bbb-3(b)(1), unless the authorization is terminated  or revoked sooner.       Influenza A by PCR NEGATIVE NEGATIVE Final   Influenza B by PCR NEGATIVE NEGATIVE Final    Comment: (NOTE) The Xpert Xpress SARS-CoV-2/FLU/RSV plus assay is intended as an aid in the diagnosis of influenza from Nasopharyngeal swab specimens and should not be used as a sole basis for treatment. Nasal washings and aspirates are unacceptable for Xpert Xpress SARS-CoV-2/FLU/RSV testing.  Fact Sheet for Patients: EntrepreneurPulse.com.au  Fact Sheet for Healthcare Providers: IncredibleEmployment.be  This test is not yet approved or cleared by the [place] and  has been authorized for detection and/or diagnosis of SARS-CoV-2 by FDA under an Emergency Use Authorization (EUA). This EUA will remain in effect (meaning this test can be used) for the duration of the COVID-19 declaration under Section 564(b)(1) of the Act, 21 U.S.C. section 360bbb-3(b)(1), unless the authorization is terminated or revoked.     Resp Syncytial Virus by PCR NEGATIVE NEGATIVE Final    Comment: (NOTE) Fact Sheet for Patients: EntrepreneurPulse.com.au  Fact Sheet for Healthcare  Providers: IncredibleEmployment.be  This test is not yet approved or cleared by the [place] FDA and has been authorized for detection and/or diagnosis of SARS-CoV-2 by FDA under an Emergency Use Authorization (EUA). This EUA will remain in effect (meaning this test can be used) for the duration of the COVID-19 declaration under Section 564(b)(1) of the Act, 21 U.S.C. section 360bbb-3(b)(1), unless the authorization is terminated or revoked.  Performed at [facility], [place] [address] [place], [place] [address]     Today   Subjective    Frank Meyer today has no headache,no chest abdominal pain,no new weakness tingling or numbness, feels much better wants to go home today.    Objective   Blood pressure 112/74, pulse 89, temperature 98.6 F (37 C), temperature source Oral, resp. rate 16, height '5\' 6"'$  (1.676 m), weight 73.1 kg, SpO2 95 %.   Intake/Output Summary (Last 24 hours) at 09/16/2022 1007 Last data filed at 09/16/2022 0836 Gross per 24 hour  Intake 798 ml  Output 2275 ml  Net -1477 ml    Exam  Awake Alert, No new F.N deficits,    [place].AT,PERRAL Supple Neck,   Symmetrical Chest wall movement, Good air movement bilaterally, CTAB RRR,No Gallops,   +ve B.Sounds, Abd Soft, Non tender,  No Cyanosis, Clubbing or edema    Data Review   Recent Labs  Lab 09/10/22 1743 09/11/22 0209 09/12/22 0018 09/13/22 0031 09/14/22 0042 09/15/22 0059  WBC 14.4* 7.0 20.0* 18.3* 15.6* 13.0*  HGB 12.4* 11.3* 10.5* 10.0* 10.7* 10.9*  HCT 40.1 35.3* 32.4* 30.5* 34.5* 35.5*  PLT 267 222 239 253 272 280  MCV 87.9 87.4 85.3 84.7 86.3 86.0  MCH 27.2 28.0 27.6 27.8 26.8 26.4  MCHC 30.9 32.0 32.4 32.8 31.0 30.7  RDW 16.6* 16.3* 16.3* 16.4* 16.5* 16.4*  LYMPHSABS 5.0*  --   --   --  2.5 3.5  MONOABS 1.4*  --   --   --  1.1* 1.0  EOSABS 0.3  --   --   --  0.0 0.4  BASOSABS 0.1  --   --   --  0.1 0.0    Recent Labs  Lab 09/10/22 1743  09/10/22 1759 09/10/22 2006 09/11/22 0209 09/12/22 0018 09/13/22 0031 09/13/22 0642 09/14/22 0042 09/15/22 0059  NA 136  --   --  138 137 139  --  138 139  K 4.1  --   --  3.9 4.0 3.9  --  4.2 3.9  CL 104  --   --  106 107 109  --  105 104  CO2 19*  --   --  21* 23 26  --  26 28  ANIONGAP 13  --   --  11 7 4*  --  7 7  GLUCOSE 137*  --   --  234* 168* 144*  --  119* 99  BUN 19  --   --  '16 19 20  '$ --  20 25*  CREATININE 1.65*  --   --  1.19 1.04 0.98  --  1.12 1.08  AST 23  --   --   --   --   --   --   --   --   ALT 20  --   --   --   --   --   --   --   --   ALKPHOS 44  --   --   --   --   --   --   --   --   BILITOT 1.0  --   --   --   --   --   --   --   --   ALBUMIN 2.8*  --   --   --   --   --   --   --   --   CRP  --   --   --   --   --   --  2.4* 2.0* 1.3*  DDIMER 0.55*  --   --   --   --   --   --   --   --   PROCALCITON  --   --   --   --   --   --  <0.10 <0.10 <0.10  LATICACIDVEN  --  3.6* 1.5  --   --   --   --   --   --   BNP 254.8*  --   --   --   --   --  618.8* 250.1* 111.2*  MG  --   --   --   --   --   --  2.4 2.3 2.3  CALCIUM 8.4*  --   --  7.8* 7.7* 7.7*  --  7.9* 8.4*     Total Time in preparing paper work, data evaluation and todays exam - 35 minutes  Signature  -    Lala Lund M.D on 09/16/2022 at 10:07 AM   -  To page go to [email or web address]

## 2022-09-16 NOTE — Progress Notes (Signed)
Occupational Therapy Treatment Patient Details Name: Frank Meyer MRN: [id number] DOB: 07/14/1941 Today's Date: 09/16/2022   History of present illness Pt is an 82 y/o M presenting to ED On 1/25 with SOB and fall at home, admitted for acute on chronic respiratory failure with hypoxia. PMH includes COPD, chronic diastolic CHF, memory loss, and seizures   OT comments  Pt progressing well towards goals, able to ambulate hallway distance on 6L O2, dropping to 87% at lowest, tolerating 3L O2 at rest while seated in chair. Pt mod I -min A for ADLs and supervision for transfers/mobility without AD. Pt demo's pursed lip breathing and energy conservation strategies during session. Pt presenting with impairments listed below, will follow acutely to maximize safety and independence with ADLs/functional mobility.   Recommendations for follow up therapy are one component of a multi-disciplinary discharge planning process, led by the attending physician.  Recommendations may be updated based on patient status, additional functional criteria and insurance authorization.    Follow Up Recommendations  Home health OT     Assistance Recommended at Discharge Intermittent Supervision/Assistance  Patient can return home with the following  A little help with walking and/or transfers;A little help with bathing/dressing/bathroom;Assistance with cooking/housework;Assist for transportation;Help with stairs or ramp for entrance   Equipment Recommendations  Tub/shower seat    Recommendations for Other Services PT consult    Precautions / Restrictions Precautions Precautions: Fall Precaution Comments: watch O2 Restrictions Weight Bearing Restrictions: No       Mobility Bed Mobility               General bed mobility comments: OOB in chair upon arrival and departure    Transfers Overall transfer level: Needs assistance Equipment used: None Transfers: Sit to/from Stand Sit to Stand:  Supervision                 Balance Overall balance assessment: Needs assistance Sitting-balance support: Feet supported, No upper extremity supported Sitting balance-Leahy Scale: Normal     Standing balance support: During functional activity Standing balance-Leahy Scale: Good Standing balance comment: no AD use/no assist                           ADL either performed or assessed with clinical judgement   ADL Overall ADL's : Needs assistance/impaired     Grooming: Modified independent;Standing;Oral care Grooming Details (indicate cue type and reason): brushing teeth standing at sink             Lower Body Dressing: Minimal assistance;Sitting/lateral leans Lower Body Dressing Details (indicate cue type and reason): pulling up socks Toilet Transfer: Supervision/safety;Ambulation;Regular Glass blower/designer Details (indicate cue type and reason): simulated via functional mobility         Functional mobility during ADLs: Supervision/safety      Extremity/Trunk Assessment Upper Extremity Assessment Upper Extremity Assessment: Overall WFL for tasks assessed   Lower Extremity Assessment Lower Extremity Assessment: Defer to PT evaluation        Vision   Vision Assessment?: No apparent visual deficits   Perception Perception Perception: Not tested   Praxis Praxis Praxis: Not tested    Cognition Arousal/Alertness: Awake/alert Behavior During Therapy: WFL for tasks assessed/performed Overall Cognitive Status: Within Functional Limits for tasks assessed                                 General Comments: hx of  memory loss        Exercises      Shoulder Instructions       General Comments SpO2 down to 87% at lowest on 6L O2 with mobility    Pertinent Vitals/ Pain       Pain Assessment Pain Assessment: No/denies pain  Home Living                                          Prior Functioning/Environment               Frequency  Min 2X/week        Progress Toward Goals  OT Goals(current goals can now be found in the care plan section)  Progress towards OT goals: Progressing toward goals  Acute Rehab OT Goals Patient Stated Goal: to go home OT Goal Formulation: With patient Time For Goal Achievement: 09/26/22 Potential to Achieve Goals: Good ADL Goals Pt Will Perform Upper Body Dressing: with modified independence;sitting Pt Will Perform Lower Body Dressing: with modified independence;sit to/from stand;sitting/lateral leans Pt Will Transfer to Toilet: with modified independence;ambulating;regular height toilet Pt Will Perform Tub/Shower Transfer: Tub transfer;Shower transfer;with modified independence;ambulating  Plan Discharge plan remains appropriate;Frequency remains appropriate    Co-evaluation                 AM-PAC OT "6 Clicks" Daily Activity     Outcome Measure   Help from another person eating meals?: None Help from another person taking care of personal grooming?: A Little Help from another person toileting, which includes using toliet, bedpan, or urinal?: A Little Help from another person bathing (including washing, rinsing, drying)?: A Lot Help from another person to put on and taking off regular upper body clothing?: A Little Help from another person to put on and taking off regular lower body clothing?: A Lot 6 Click Score: 17    End of Session Equipment Utilized During Treatment: Gait belt;Oxygen (3-6L)  OT Visit Diagnosis: Unsteadiness on feet (R26.81);Other abnormalities of gait and mobility (R26.89);Muscle weakness (generalized) (M62.81)   Activity Tolerance Patient tolerated treatment well   Patient Left in chair;with call bell/phone within reach;with family/visitor present   Nurse Communication Mobility status        Time: 4920-1007 OT Time Calculation (min): 20 min  Charges: OT General Charges $OT Visit: 1 Visit OT  Treatments $Therapeutic Activity: 8-22 mins  Renaye Rakers, OTD, OTR/L SecureChat [facility]erred Acute Rehab [phone] - [place] 09/16/2022, 10:29 AM

## 2022-09-16 NOTE — Plan of Care (Signed)
  Problem: Education: Goal: Knowledge of disease or condition will improve Outcome: Adequate for Discharge Goal: Knowledge of the prescribed therapeutic regimen will improve Outcome: Adequate for Discharge Goal: Individualized Educational Video(s) Outcome: Adequate for Discharge   Problem: Activity: Goal: Ability to tolerate increased activity will improve Outcome: Adequate for Discharge Goal: Will verbalize the importance of balancing activity with adequate rest periods Outcome: Adequate for Discharge   Problem: Respiratory: Goal: Ability to maintain a clear airway will improve Outcome: Adequate for Discharge Goal: Levels of oxygenation will improve Outcome: Adequate for Discharge Goal: Ability to maintain adequate ventilation will improve Outcome: Adequate for Discharge   Problem: Education: Goal: Knowledge of General Education information will improve Description: Including pain rating scale, medication(s)/side effects and non-pharmacologic comfort measures Outcome: Adequate for Discharge   Problem: Health Behavior/Discharge Planning: Goal: Ability to manage health-related needs will improve Outcome: Adequate for Discharge   Problem: Clinical Measurements: Goal: Ability to maintain clinical measurements within normal limits will improve Outcome: Adequate for Discharge Goal: Will remain free from infection Outcome: Adequate for Discharge Goal: Diagnostic test results will improve Outcome: Adequate for Discharge Goal: Respiratory complications will improve Outcome: Adequate for Discharge Goal: Cardiovascular complication will be avoided Outcome: Adequate for Discharge   Problem: Activity: Goal: Risk for activity intolerance will decrease Outcome: Adequate for Discharge   Problem: Coping: Goal: Level of anxiety will decrease Outcome: Adequate for Discharge   Problem: Safety: Goal: Ability to remain free from injury will improve Outcome: Adequate for Discharge    Problem: Skin Integrity: Goal: Risk for impaired skin integrity will decrease Outcome: Adequate for Discharge

## 2022-09-17 ENCOUNTER — Telehealth: Payer: Self-pay | Admitting: Pulmonary Disease

## 2022-09-17 NOTE — Telephone Encounter (Signed)
Pt calling to see if Dr. Lemmie Evens will send a referral to Pulmonary Rehab. They won't let him in unless Dr. Or  the hospital sends in a referral. His # is [phone]

## 2022-09-17 NOTE — Telephone Encounter (Signed)
Called and spoke to patient about his pulmonary rehab referral. He is asking if it got cleared or approved with his insurance. I see the message in the referral order about him calling them back after he was done with physical therapy. But he states when he called them they told him to clear it with insurance first before he called them again  Please advise

## 2022-09-17 NOTE — Telephone Encounter (Signed)
I adv PT I can see the referral already sent in. With regards to his PT please ask him about this when you call. He states a trip to the Hospital disrupted this and has further info for you.

## 2022-09-21 NOTE — Telephone Encounter (Signed)
There is a order that was placed for rehab and it has a note on it that the patient is to call once he finishes physical therapy also usually rehab checks insurance

## 2022-09-21 NOTE — Telephone Encounter (Signed)
Called [facility] myself and they pulled up patients chart and I updated them that patient told me he was done with physical therapy and she told me she would call patient to get the ball moving with his pulmonary reb part. Nothing further needed

## 2022-09-23 ENCOUNTER — Ambulatory Visit
Admission: RE | Admit: 2022-09-23 | Discharge: 2022-09-23 | Disposition: A | Discharge status: Home / Self Care | Payer: Medicare Other | Source: Ambulatory Visit | Attending: Pulmonary Disease | Admitting: Pulmonary Disease

## 2022-09-23 DIAGNOSIS — R911 Solitary pulmonary nodule: Secondary | ICD-10-CM

## 2022-09-28 NOTE — Progress Notes (Signed)
CT shows stable nodules which is good news

## 2022-09-29 ENCOUNTER — Encounter (HOSPITAL_BASED_OUTPATIENT_CLINIC_OR_DEPARTMENT_OTHER): Payer: Self-pay | Admitting: Family

## 2022-09-29 ENCOUNTER — Ambulatory Visit (HOSPITAL_BASED_OUTPATIENT_CLINIC_OR_DEPARTMENT_OTHER): Payer: Medicare Other | Admitting: Family

## 2022-09-29 VITALS — BP 122/64 | HR 94 | Ht 66.0 in | Wt 165.0 lb

## 2022-09-29 DIAGNOSIS — I25118 Atherosclerotic heart disease of native coronary artery with other forms of angina pectoris: Secondary | ICD-10-CM

## 2022-09-29 DIAGNOSIS — I272 Pulmonary hypertension, unspecified: Secondary | ICD-10-CM

## 2022-09-29 DIAGNOSIS — I5032 Chronic diastolic (congestive) heart failure: Secondary | ICD-10-CM | POA: Diagnosis not present

## 2022-09-29 DIAGNOSIS — J449 Chronic obstructive pulmonary disease, unspecified: Secondary | ICD-10-CM

## 2022-09-29 DIAGNOSIS — E782 Mixed hyperlipidemia: Secondary | ICD-10-CM | POA: Diagnosis not present

## 2022-09-29 DIAGNOSIS — R413 Other amnesia: Secondary | ICD-10-CM

## 2022-09-29 MED ORDER — FUROSEMIDE 20 MG PO TABS: 20.0000 mg | ORAL_TABLET | Freq: Every day | ORAL | 3 refills | Status: DC

## 2022-09-29 NOTE — Patient Instructions (Signed)
Medication Instructions:  Your physician has recommended you make the following change in your medication:    STOP Potassium   REDUCE Furosemide (Lasix) to 65m daily We have sent a refill into your pharmacy for the lower dose Can use up your 48mtablets by taking a half tablet daily  *If you need a refill on your cardiac medications before your next appointment, please call your pharmacy*  Lab Work: Your physician recommends labs in one week at your primary care provider for BMP, BNP. This will help usKoreaeep a close eye on your kidney function and potassium.  Follow-Up: [facility] are our [facility].  As part of our continuing mission to provide you with exceptional heart care, we have created designated Provider Care Teams.  These Care Teams include your primary Cardiologist (physician) and Advanced Practice Providers (APPs -  Physician Assistants and Nurse Practitioners) who all work together to provide you with the care you need, when you need it.  We recommend signing up for the patient portal called "MyChart".  Sign up information is provided on this After Visit Summary.  MyChart is used to connect with patients for Virtual Visits (Telemedicine).  Patients are able to view lab/test results, encounter notes, upcoming appointments, etc.  Non-urgent messages can be sent to your provider as well.   To learn more about what you can do with MyChart, go to [email or web address]   Your next appointment:   As scheduled with Dr. KrAgustin CreeOther Instructions  Recommend starting slow with your exercises from physical therapy. Try doing half of the number of reps (for example, instead of standing up 20 times start by standing up 10 times. You can also do the exercises while sitting below.   Pulmonology has sent your referral to pulmonary rehab and you should heart from them this week. If you do not hear from them by Friday, would recommend calling them  at [phone] to do While Sitting  Exercises that you do while sitting (chair exercises) can give you many of the same benefits as full exercise. Benefits include strengthening your heart, burning calories, and keeping muscles and joints healthy. Exercise can also improve your mood and help with depression and anxiety. You may benefit from chair exercises if you are unable to do standing exercises due to: Diabetic foot pain. Obesity. Illness. Arthritis. Recovery from surgery or injury. Breathing problems. Balance problems. Another type of disability. Before starting chair exercises, check with your health care provider or a physical therapist to find out how much exercise you can tolerate and which exercises are safe for you. If your health care provider approves: Start out slowly and build up over time. Aim to work up to about 10-20 minutes for each exercise session. Make exercise part of your daily routine. Drink water when you exercise. Do not wait until you are thirsty. Drink every 10-15 minutes. Stop exercising right away if you have pain, nausea, shortness of breath, or dizziness. If you are exercising in a wheelchair, make sure to lock the wheels. Ask your health care provider whether you can do tai chi or yoga. Many positions in these mind-body exercises can be modified to do while seated. Warm-up Before starting other exercises: Sit up as straight as you can. Have your knees bent at 90 degrees, which is the shape of the capital letter "L." Keep your feet flat on the floor. Sit at the front edge of your chair, if  you can. Pull in (tighten) the muscles in your abdomen and stretch your spine and neck as straight as you can. Hold this position for a few minutes. Breathe in and out evenly. Try to concentrate on your breathing, and relax your mind. Stretching Exercise A: Arm stretch Hold your arms out straight in front of your body. Bend your hands at the wrist with your  fingers pointing up, as if signaling someone to stop. Notice the slight tension in your forearms as you hold the position. Keeping your arms out and your hands bent, rotate your hands outward as far as you can and hold this stretch. Aim to have your thumbs pointing up and your pinkie fingers pointing down. Slowly repeat arm stretches for one minute as tolerated. Exercise B: Leg stretch If you can move your legs, try to "draw" letters on the floor with the toes of your foot. Write your name with one foot. Write your name with the toes of your other foot. Slowly repeat the movements for one minute as tolerated. Exercise C: Reach for the sky Reach your hands as far over your head as you can to stretch your spine. Move your hands and arms as if you are climbing a rope. Slowly repeat the movements for one minute as tolerated. Range of motion exercises Exercise A: Shoulder roll Let your arms hang loosely at your sides. Lift just your shoulders up toward your ears, then let them relax back down. When your shoulders feel loose, rotate your shoulders in backward and forward circles. Do shoulder rolls slowly for one minute as tolerated. Exercise B: March in place As if you are marching, pump your arms and lift your legs up and down. Lift your knees as high as you can. If you are unable to lift your knees, just pump your arms and move your ankles and feet up and down. March in place for one minute as tolerated. Exercise C: Seated jumping jacks Let your arms hang down straight. Keeping your arms straight, lift them up over your head. Aim to point your fingers to the ceiling. While you lift your arms, straighten your legs and slide your heels along the floor to your sides, as wide as you can. As you bring your arms back down to your sides, slide your legs back together. If you are unable to use your legs, just move your arms. Slowly repeat seated jumping jacks for one minute as  tolerated. Strengthening exercises Exercise A: Shoulder squeeze Hold your arms straight out from your body to your sides, with your elbows bent and your fists pointed at the ceiling. Keeping your arms in the bent position, move them forward so your elbows and forearms meet in front of your face. Open your arms back out as wide as you can with your elbows still bent, until you feel your shoulder blades squeezing together. Hold for 5 seconds. Slowly repeat the movements forward and backward for one minute as tolerated. Contact a health care provider if: You have to stop exercising due to any of the following: Pain. Nausea. Shortness of breath. Dizziness. Fatigue. You have significant pain or soreness after exercising. Get help right away if: You have chest pain. You have difficulty breathing.

## 2022-09-29 NOTE — Telephone Encounter (Signed)
Patient is scheduled for HFU on 10/02/2022 with Dr. Silas Flood. Closing encounter

## 2022-09-29 NOTE — Progress Notes (Signed)
Office Visit    Patient Name: Frank Meyer Date of Encounter: 09/29/2022  PCP:  Burnard Bunting, [place]  Cardiologist:  Jenne Campus, MD  Advanced Practice Provider:  No care team member to display Electrophysiologist:  None      Chief Complaint    Frank Meyer is a 82 y.o. male presents today for hospital follow up.   Past Medical History    Past Medical History:  Diagnosis Date   Allergic rhinitis 10/24/2009   Altered bowel habits 10/04/2020   Basal cell carcinoma    Benign prostatic hyperplasia with lower urinary tract symptoms 10/24/2009   Benign prostatic hypertrophy    Bradycardia 12/06/2019   Callosity 10/16/2013   Cardiomegaly 01/23/2015   Chronic diastolic heart failure ([place]) 12/19/2018   Chronic respiratory failure ([place]) 10/04/2020   Coronary artery calcification seen on CAT scan 05/28/2020   Dyslipidemia    Dyspnea on exertion 03/18/2018   Emphysema lung ([place]) 03/18/2018   Encounter for general adult medical examination without abnormal findings 01/17/2015   Ex-cigarette smoker 01/20/2021   Exudative age-related macular degeneration of left eye with active choroidal neovascularization ([place]) 12/18/2019   Gallbladder problem    Gastroesophageal reflux disease    Hiatal hernia    Hilar adenopathy 03/25/2018   History of smoking 03/18/2018   Hyperlipidemia 10/24/2009   Insomnia 05/09/2013   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   Internal hemorrhoid 10/04/2020   Macular degeneration    Macular pucker, right eye 04/30/2020   Mediastinal adenopathy 03/25/2018   Memory loss 05/09/2013   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   Mild memory disturbance    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   Noninfective gastroenteritis and colitis, unspecified 10/04/2020   Nuclear sclerotic cataract of right eye 12/18/2019   Other specified symptoms and signs  involving the digestive system and abdomen 10/04/2020   Overweight 09/14/2018   Oxygen dependent 10/04/2020   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 male with a hx of COPD and chronic hypoxic respiratory failure on 5 L of home oxygen, lung nodules, chronic diastolic heart failure, seizures, CKD 2, hyperlipidemia memory loss last seen in clinic 08/20/2022 by Dr. Agustin Cree.  Prior echocardiogram with LVEF as low as 45%.  Prior CTA 2021 showed coronary artery disease but FFR negative.  Echo 08/20/2021 LVEF 50 to [id number], grade 1 diastolic dysfunction, mild LVH, mild MR/AI.  Monitor 1 01/2022 with predominantly normal sinus rhythm and 34 runs of SVT.  Admitted 07/20/2022 with pneumonia.  Admitted 12/17 - 08/07/2022 with COPD exacerbation treated with IV Solu-Medrol and antibiotics and prednisone taper at discharge.  CT chest per review by infectious disease did show blepharitis acting as abscesses and recommended 3 to 4 weeks of Augmentin.  Echocardiogram during that admission with preserved LVEF 55 to 60%, no RWMA.  Admitted 1/25 -  09/16/2022 after presenting with fall and acute hypoxic respiratory failure and acute on chronic heart failure.  He was treated with IV Lasix, steroids, azithromycin.  Diuresed 4.5 to 5 L and discharged home on low-dose diuretic.  He did have AKI while admitted but resolved prior to discharge. Discharge weight 160.82  lbs.   Presently today for follow up independently. Notes since discharge feels he is a bit sluggish and doesn't have the same "get up and go". He did complete HH PT but did not feel it accomplished much.  Is awaiting call from pulmonary rehab.  Notes his weight at home has been between 159-164 pounds most often at 162 pounds.  No lower extremity IMA, orthopnea, PND.  Exertional dyspnea is stable since hospital discharge.  Denies chest pain.  Labs 09/23/22 via [facility]: ALT 29, AST 20, GFR 48.6, K 4.7  EKGs/Labs/Other Studies Reviewed:   The following studies were reviewed today:  Echo 08/03/22   1. Left ventricular ejection fraction, by estimation, is 55 to 60%. The  left ventricle has normal function. The left ventricle has no regional  wall motion abnormalities. Left ventricular diastolic parameters are  consistent with Grade I diastolic  dysfunction (impaired relaxation).   2. Right ventricular systolic function is normal. The right ventricular  size is mildly enlarged. Tricuspid regurgitation signal is inadequate for  assessing PA pressure.   3. Left atrial size was mild to moderately dilated.   4. The mitral valve is grossly normal. Trivial mitral valve  regurgitation. No evidence of mitral stenosis.   5. The aortic valve is tricuspid. Aortic valve regurgitation is trivial.  Aortic valve sclerosis is present, with no evidence of aortic valve  stenosis.  EKG:  EKG is  ordered today.  The ekg ordered today demonstrates NSR 92 bpm with no acute ST/T wave changes.  Recent Labs: 09/10/2022: ALT 20 09/15/2022: B Natriuretic Peptide 111.2; BUN 25; Creatinine, Ser 1.08; Hemoglobin 10.9; Magnesium 2.3; Platelets 280; Potassium 3.9; Sodium 139  Recent Lipid Panel    Component Value Date/Time   CHOL 147 06/13/2020 0819   TRIG 129 06/13/2020 0819   HDL 57 06/13/2020 0819   CHOLHDL 2.6 06/13/2020 0819   LDLCALC 67 06/13/2020 0819    Home Medications   Current Meds  Medication Sig    ANORO ELLIPTA 62.5-25 MCG/ACT AEPB Inhale 1 puff into the lungs daily.   aspirin 81 MG EC tablet Take 81 mg by mouth daily.   atorvastatin (LIPITOR) 20 MG tablet Take 20 mg by mouth every evening.   azithromycin (ZITHROMAX) 250 MG tablet Take 1 tablet (250 mg total) by mouth daily.   cholecalciferol (VITAMIN D) 1000 UNITS tablet Take 1,000 Units by mouth daily.   donepezil (ARICEPT) 10 MG tablet Take 1 tablet (10 mg total) by mouth daily. (Patient taking differently: Take 10 mg by mouth at bedtime.)   fexofenadine (ALLEGRA) 180 MG tablet Take 180 mg by mouth daily.   finasteride (PROSCAR) 5 MG tablet Take 5 mg by mouth at bedtime.   ipratropium (ATROVENT) 0.06 % nasal spray Place 1-2 sprays into both nostrils See admin instructions. 2 sprays in the right nostril and 1 spray in the left nostril once daily   ipratropium-albuterol (DUONEB) 0.5-2.5 (3) MG/3ML SOLN Take 3 mLs by nebulization 2 (two) times daily.   levETIRAcetam (KEPPRA) 250 MG tablet Take 1-2 tablets (250-500 mg total) by mouth See admin instructions. Take 250 mg by mouth in the morning and 500 mg by mouth at night. (Patient  taking differently: Take 250-500 mg by mouth See admin instructions. Take one tablet (250 mg) by mouth in the morning and two tablets (500 mg) by mouth at night.)   Melatonin 10 MG TABS Take 20 mg by mouth at bedtime.   omeprazole (PRILOSEC) 40 MG capsule Take 40 mg by mouth daily.    OXYGEN Inhale 2-5 L into the lungs as directed. 5lt during the day and 2lt at night   sacubitril-valsartan (ENTRESTO) 24-26 MG Take 1 tablet by mouth daily. (Patient taking differently: Take 1 tablet by mouth every evening.)   thiamine 100 MG tablet Take 1 tablet (100 mg total) by mouth daily.   traZODone (DESYREL) 100 MG tablet TAKE 2 TABLETS BY MOUTH AT  BEDTIME   [DISCONTINUED] furosemide (LASIX) 40 MG tablet Take 1 tablet (40 mg total) by mouth daily.   [DISCONTINUED] potassium chloride SA (KLOR-CON M) 20 MEQ tablet Take 1 tablet  (20 mEq total) by mouth daily.    Review of Systems      All other systems reviewed and are otherwise negative except as noted above.  Physical Exam    VS:  BP 122/64   Pulse 94   Ht 5' 6"$  (1.676 m)   Wt 165 lb (74.8 kg)   BMI 26.63 kg/m  , BMI Body mass index is 26.63 kg/m.  Wt Readings from Last 3 Encounters:  09/29/22 165 lb (74.8 kg)  09/16/22 161 lb 1.6 oz (73.1 kg)  08/25/22 160 lb (72.6 kg)    GEN: Well nourished, well developed, in no acute distress. HEENT: normal. Neck: Supple, no JVD, carotid bruits, or masses. Cardiac: RRR, no murmurs, rubs, or gallops. No clubbing, cyanosis, edema.  Radials/PT 2+ and equal bilaterally.  Respiratory:  Respirations regular and unlabored, clear to auscultation bilaterally. On 5L home O2. GI: Soft, nontender, nondistended. MS: No deformity or atrophy. Skin: Warm and dry, no rash. Neuro:  Strength and sensation are intact. Psych: Normal affect.  Assessment & Plan    HFpEF - Euvolemic and well compensated on exam. GDMT includes Lasix 33m QD, Entresto 24-231mBID. As he is euvolemic and for protection of renal function will reduce to Lasix 20 mg daily.  Will request PCP office collect BMP, BNP at follow-up in 1 week. Low sodium diet, fluid restriction <2L, and daily weights encouraged. Educated to contact our office for weight gain of 2 lbs overnight or 5 lbs in one week. Could consider SGLTi at follow up.  CKDII - Careful titration of diuretic and antihypertensive.    Hypokalemia -will 09/24/2022 K4.7.  As we are reducing Lasix anticipate he will not need potassium.  Start potassium 20 mill equivalent.  Repeat BMP in 1 week at PCP.  If recurrent hypokalemia noted could resume.  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 HuSilas Floodf pulmonology. Upcoming follow up. Has been referred to pulmonary rehab.  HLD - Continue Atorvastatin 2040mD.  Memory loss - On Aricept. Followed by neurology.   Nonobstructive  coronary artery disease - Stable with no anginal symptoms. No indication for ischemic evaluation.  GDMT aspirin, atorvastatin. No BB due to lung disease.  GERD - Continue Prilosec. Continue to follow with PCP.          Cardiac Rehabilitation Eligibility Assessment  The patient is ready to start cardiac rehabilitation from a cardiac standpoint.   Disposition: Follow up  as scheduled  with [facility] or APP.  Signed, CaiLoel DubonnetP  09/29/2022, 1:43 PM [facility]

## 2022-10-02 ENCOUNTER — Encounter: Payer: Self-pay | Admitting: Pulmonary Disease

## 2022-10-02 ENCOUNTER — Ambulatory Visit: Payer: Medicare Other | Admitting: Pulmonary Disease

## 2022-10-02 VITALS — BP 120/60 | HR 90 | Ht 66.0 in | Wt 168.0 lb

## 2022-10-02 DIAGNOSIS — J431 Panlobular emphysema: Secondary | ICD-10-CM

## 2022-10-02 DIAGNOSIS — J9611 Chronic respiratory failure with hypoxia: Secondary | ICD-10-CM

## 2022-10-02 NOTE — Progress Notes (Signed)
Who presents  @Patient$  ID: Frank Meyer, male    DOB: 1941/03/28, 82 y.o.   MRN: [id number]  Chief Complaint  Patient presents with   Follow-up    Referring provider: Burnard Bunting, MD  HPI:   82 y.o. COPD/emphysema seen in follow up for evaluation of pulmonary hypertension, DOE, chronic hypoxemic respiratory failure and in hospital follow-up from recent admission for decompensated congestive heart failure.  Discharge summary reviewed.  Most recent cardiology note reviewed..  Patient admitted to hospital 08/2022.  Felt mostly due to volume overload in the setting of congestive heart failure due to reduced EF.  Diuresed.  Treated with antibiotics and steroids just in case. Most improvement with significant diuresis.  BNP was elevated.  Chest x-ray on my read interpretation looks similar to prior.  Discharge weight was 161 pounds.  Notes his weight at home has been around 162 mostly.  Has been climbing over the last couple of days.  Up to 165.  Almost 166.  Lasix was decreased to 20 mg once a day from 40 mg once daily this week, 09/29/2022.  His breathing status feels stable.  No worse.  Oxygenation about the same.  It will drop to the mid 80s without largely asymptomatic.  He feels a lack of motivation to exercise, get back on the treadmill.  We discussed tragedies to mitigate this.   HPI at initial visit:  Patient longstanding history of shortness of breath.  He is need oxygen for some years.  Uses up to 5 L with activity, 3-4 at rest.  His shortness of breath is described as severe.  Worse with exertion.  Somewhat relieved by rest over time.  No positional changes.  Does not seem to change with seasons, environment.  No other alleviating or exacerbating factors.  No history or family history I should say of pulmonary hypertension.  He denies any connective tissue disease.  No arthralgias, arthritis.  Denies Raynaud's phenomenon.  Never taken diet supplements.  Does not use  methamphetamine.  No congenital heart disease per his report.  Extensive review of medical record performed today.  Recent high-res CT chest with severe centrilobular and paraseptal emphysema on my review.  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  No evidence of ILD on my review.  TTE performed 11/2019.  This demonstrated mildly dilated LA, diastolic dysfunction, mild aortic regurgitation, mildly dilated RA, mildly enlarged RV with normal RV function, estimated elevated PASP.  Most recent PFTs 06/2020 demonstrated severely reduced DLCO 44% predicted with a surprisingly PFTs, TLC, no evidence of hyperinflation.  PMH: COPD, enlarged prostate, hyperlipidemia Surgical history: Reviewed in detail, he denies significant surgical history Family history: Memory loss in mother, denies significant pulmonary or respiratory issues and family Social history: Former smoker, 50-pack-year history, quit late 90s, retired   Licensed conveyancer / Pulmonary Flowsheets:   ACT:      No data to display          MMRC: mMRC Dyspnea Scale mMRC Score  09/26/2020 10:47 AM 0    [place]:      No data to display          Tests:   FENO:  No results found for: "NITRICOXIDE"  PFT:    Latest Ref Rng & Units 06/26/2020    3:50 PM 04/27/2018    8:48 AM  PFT Results  FVC-Pre L 3.75  4.11   FVC-Predicted Pre % 111  119   FVC-Post L 3.85  4.07  FVC-Predicted Post % 113  118   Pre FEV1/FVC % % 78  79   Post FEV1/FCV % % 78  78   FEV1-Pre L 2.92  3.23   FEV1-Predicted Pre % 122  131   FEV1-Post L 3.02  3.20   DLCO uncorrected ml/min/mmHg 9.44  11.81   DLCO UNC% % 44  44   DLCO corrected ml/min/mmHg 9.61  11.92   DLCO COR %Predicted % 44  44   DLVA Predicted % 45  49   TLC L 5.75  5.24   TLC % Predicted % 92  84   RV % Predicted % 87  51   Personally reviewed and interpreted as normal spirometry, no bronchodilator spots, normal lung volumes, DLCO severely  reduced  WALK:      No data to display          Imaging: Personally reviewed and as per EMR discussion of this note  Lab Results: Personally reviewed, notably eosinophils as high as 400 06/2020 CBC    Component Value Date/Time   WBC 13.0 (H) 09/15/2022 0059   RBC 4.13 (L) 09/15/2022 0059   HGB 10.9 (L) 09/15/2022 0059   HCT 35.5 (L) 09/15/2022 0059   PLT 280 09/15/2022 0059   MCV 86.0 09/15/2022 0059   MCH 26.4 09/15/2022 0059   MCHC 30.7 09/15/2022 0059   RDW 16.4 (H) 09/15/2022 0059   LYMPHSABS 3.5 09/15/2022 0059   MONOABS 1.0 09/15/2022 0059   EOSABS 0.4 09/15/2022 0059   BASOSABS 0.0 09/15/2022 0059    BMET    Component Value Date/Time   NA 139 09/15/2022 0059   NA 139 10/28/2020 0930   K 3.9 09/15/2022 0059   CL 104 09/15/2022 0059   CO2 28 09/15/2022 0059   GLUCOSE 99 09/15/2022 0059   BUN 25 (H) 09/15/2022 0059   BUN 15 10/28/2020 0930   CREATININE 1.08 09/15/2022 0059   CALCIUM 8.4 (L) 09/15/2022 0059   GFRNONAA >60 09/15/2022 0059   GFRAA 64 06/13/2020 0819    BNP    Component Value Date/Time   BNP 111.2 (H) 09/15/2022 0059    ProBNP    Component Value Date/Time   PROBNP 223 10/15/2020 1632    Specialty Problems       Pulmonary Problems   Allergic rhinitis   Dyspnea on exertion   Panlobular emphysema (HCC)   Right upper lobe pulmonary nodule   Hiatal hernia   Chronic respiratory failure with hypoxia (HCC)   Oxygen dependent   COPD (chronic obstructive pulmonary disease) (HCC)   Aspiration pneumonitis (HCC)   Acute exacerbation of chronic obstructive pulmonary disease (COPD) ([place])   CAP (community acquired pneumonia)   Acute and chronic respiratory failure with hypoxia ([place])   COPD with acute exacerbation ([place])    No Known Allergies  Immunization History  Administered Date(s) Administered   DTaP, 5 pertussis antigens 02/17/2016   Fluad Quad(high Dose 65+) 04/29/2019, 04/20/2020   Influenza Split 05/10/2012, 05/20/2013,  08/17/2013   Influenza, High Dose Seasonal PF 05/24/2017, 06/03/2018   Influenza-Unspecified 06/04/2018, 04/27/2019   PFIZER(Purple Top)SARS-COV-2 Vaccination 09/25/2019, 10/20/2019, 04/03/2020, 05/14/2020   Pneumococcal Conjugate-13 07/20/2014    Past Medical History:  Diagnosis Date   Allergic rhinitis 10/24/2009   Altered bowel habits 10/04/2020   Basal cell carcinoma    Benign prostatic hyperplasia with lower urinary tract symptoms 10/24/2009   Benign prostatic hypertrophy    Bradycardia 12/06/2019   Callosity 10/16/2013   Cardiomegaly 01/23/2015   Chronic  diastolic heart failure ([place]) 12/19/2018   Chronic respiratory failure ([place]) 10/04/2020   Coronary artery calcification seen on CAT scan 05/28/2020   Dyslipidemia    Dyspnea on exertion 03/18/2018   Emphysema lung ([place]) 03/18/2018   Encounter for general adult medical examination without abnormal findings 01/17/2015   Ex-cigarette smoker 01/20/2021   Exudative age-related macular degeneration of left eye with active choroidal neovascularization ([place]) 12/18/2019   Gallbladder problem    Gastroesophageal reflux disease    Hiatal hernia    Hilar adenopathy 03/25/2018   History of smoking 03/18/2018   Hyperlipidemia 10/24/2009   Insomnia 05/09/2013   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   Internal hemorrhoid 10/04/2020   Macular degeneration    Macular pucker, right eye 04/30/2020   Mediastinal adenopathy 03/25/2018   Memory loss 05/09/2013   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   Mild memory disturbance    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   Noninfective gastroenteritis and colitis, unspecified 10/04/2020   Nuclear sclerotic cataract of right eye 12/18/2019   Other specified symptoms and signs involving the digestive system and abdomen 10/04/2020   Overweight 09/14/2018   Oxygen dependent 10/04/2020   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   Personal  history of colonic polyps 10/04/2020   Posterior vitreous detachment of right eye 12/18/2019   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   Pulmonary hypertension ([place])    Right upper lobe pulmonary nodule 03/25/2018   Syncope and collapse 12/12/2018   TIA (transient ischemic attack)    Transient ischemic attack 10/24/2009   Weight decreased 10/04/2020    Tobacco History: Social History   Tobacco Use  Smoking Status Former   Packs/day: 2.00   Years: 25.00   Total pack years: 50.00   Types: Cigarettes   Quit date: 1994   Years since quitting: 30.1  Smokeless Tobacco Never   Counseling given: Not Answered   Continue to not smoke  Outpatient Encounter Medications as of 10/02/2022  Medication Sig   ANORO ELLIPTA 62.5-25 MCG/ACT AEPB Inhale 1 puff into the lungs daily.   aspirin 81 MG EC tablet Take 81 mg by mouth daily.   atorvastatin (LIPITOR) 20 MG tablet Take 20 mg by mouth every evening.   azithromycin (ZITHROMAX) 250 MG tablet Take 1 tablet (250 mg total) by mouth daily.   cholecalciferol (VITAMIN D) 1000 UNITS tablet Take 1,000 Units by mouth daily.   donepezil (ARICEPT) 10 MG tablet Take 1 tablet (10 mg total) by mouth daily. (Patient taking differently: Take 10 mg by mouth at bedtime.)   fexofenadine (ALLEGRA) 180 MG tablet Take 180 mg by mouth daily.   finasteride (PROSCAR) 5 MG tablet Take 5 mg by mouth at bedtime.   furosemide (LASIX) 20 MG tablet Take 1 tablet (20 mg total) by mouth daily.   ipratropium (ATROVENT) 0.06 % nasal spray Place 1-2 sprays into both nostrils See admin instructions. 2 sprays in the right nostril and 1 spray in the left nostril once daily   ipratropium-albuterol (DUONEB) 0.5-2.5 (3) MG/3ML SOLN Take 3 mLs by nebulization 2 (two) times daily.   levETIRAcetam (KEPPRA) 250 MG tablet Take 1-2 tablets (250-500 mg total) by mouth See admin instructions. Take 250 mg by mouth in the morning and 500 mg by mouth at night. (Patient  taking differently: Take 250-500 mg by mouth See admin instructions. Take one tablet (250 mg) by mouth in the morning and two tablets (500 mg) by mouth at night.)  Melatonin 10 MG TABS Take 20 mg by mouth at bedtime.   omeprazole (PRILOSEC) 40 MG capsule Take 40 mg by mouth daily.    OXYGEN Inhale 2-5 L into the lungs as directed. 5lt during the day and 2lt at night   sacubitril-valsartan (ENTRESTO) 24-26 MG Take 1 tablet by mouth daily. (Patient taking differently: Take 1 tablet by mouth every evening.)   thiamine 100 MG tablet Take 1 tablet (100 mg total) by mouth daily.   traZODone (DESYREL) 100 MG tablet TAKE 2 TABLETS BY MOUTH AT  BEDTIME   nitroGLYCERIN (NITROSTAT) 0.4 MG SL tablet Place 1 tablet (0.4 mg total) under the tongue every 5 (five) minutes as needed. Chest pain (Patient taking differently: Place 0.4 mg under the tongue every 5 (five) minutes as needed for chest pain. Chest pain)   No facility-administered encounter medications on file as of 10/02/2022.     Review of Systems  Review of Systems  n/a Physical Exam  BP 120/60 (BP Location: Right Arm, Cuff Size: Normal)   Pulse 90   Ht 5' 6"$  (1.676 m)   Wt 168 lb (76.2 kg)   SpO2 92%   BMI 27.12 kg/m   Wt Readings from Last 5 Encounters:  10/02/22 168 lb (76.2 kg)  09/29/22 165 lb (74.8 kg)  09/16/22 161 lb 1.6 oz (73.1 kg)  08/25/22 160 lb (72.6 kg)  08/20/22 162 lb 1.9 oz (73.5 kg)    BMI Readings from Last 5 Encounters:  10/02/22 27.12 kg/m  09/29/22 26.63 kg/m  09/16/22 26.00 kg/m  08/25/22 25.44 kg/m  08/20/22 25.77 kg/m     Physical Exam General: Well-appearing, sitting in chair Eyes: EOMI, no icterus Neck: Supple, no JVP appreciated Respiratory: Diminished throughout, normal work of breathing, no wheeze Cardiovascular: Regular rhythm, no murmur MSK: Tender to palpation of her right rib cage    Assessment & Plan:   Dyspnea exertion: Likely multifactorial.  Related to emphysema, suspected  dynamic gas trapping although none demonstrated on the recent pulmonary function test.  Worsening the summer 2023 in the setting of decreased activity after starting seizure medication and inability to drive, play golf etc.  Suspect largest contributor to worsening is deconditioning and worsening lung function with pneumonia and COPD exacerbations.  Encouraged him to try to increase activity at home.  Anoro, no improvement with Trelegy.  Referral to pulmonary rehab looks like is been accepted but delayed now with recent hospitalization, encouraged him to contact pulm rehab center to schedule appointment, will reach out to our schedulers as well.  Advised graduated exercise regimen on treadmill..  Lung nodule: Bilateral, right upper lobe no suspicious.  Repeat CT scan 09/2022 with improvement.  Overall,his performance status is poor, high oxygen requirement, poor candidate for therapies.  Could consider radiation empirically versus biopsy.  High risk biopsy was significant emphysema.  As well as significant hypoxemia.  Pulmonary hypertension, presumed: Based on TTE  11/2019.  No right heart cath in the past.  Suspect multifactorial.  Likely component of Group 2 disease given dilated LA, aortic valve insufficiency, diastolic dysfunction.  Likely largest component of group 3 disease given significant emphysema seen on CT as well as chronic hypoxemic respiratory failure in the setting of emphysema. VQ scan negative. No OSA, nocturnal hypoxemia present. TTE 09/2020 with improved now normal PASP but new development mildly reduced RV function. New mild reduced LV function as well. Repeat TTE 12/2020 with improved RV and LV function in setting of oxygen therapy and entresto per  cardiology.    No further therapy at this time.   Emphysema: significant on CT scan, with some evidence of worsening 09/2022.Marland Kitchen Spirometry WNL, no gas trapping, DLCO reduced. Albuterol not helpful.  Continue Anoro, Trelegy not helpful.  Possible  pulmonary fibrosis: Demonstrated CT scan 06/2021.  Repeat CT scan as above without significant changes.  Chronic hypoxemic respiratory failure: Continue POC device 5 to 6 L with exertion.  Encouraged to increase as needed at home to maintain oxygen saturations especially with exertion, treadmill use.  Return in about 3 months (around 12/31/2022).   Lanier Clam, MD 10/02/2022  I spent 40 minutes in care of patient including review of records, face-to-face visit, coordination of care.

## 2022-10-02 NOTE — Patient Instructions (Signed)
Nice to see you again  The number for pulmonary rehab is  [phone]  I will send a message too  Use the duonebs before getting on the treadmill - try doing 8 minutes at 2.0 for 2 weeks, then increase by 2-3 minutes every 2 weeks or so  Return to clinic in 3 months or sooner as needed with Dr. Silas Flood

## 2022-10-05 ENCOUNTER — Telehealth (HOSPITAL_COMMUNITY): Payer: Self-pay

## 2022-10-05 NOTE — Telephone Encounter (Signed)
Pt insurance is active and benefits verified through [facility] [employer]. Co-pay $15.00, DED $0.00/$0.00 met, out of pocket $3,600.00/$223.57 met, co-insurance 0%. No pre-authorization required. [employer] [employer], 10/05/2022 @ 12:01PM, REF# [id number]

## 2022-10-05 NOTE — Telephone Encounter (Signed)
Called patient to see if he was interested in participating in the Pulmonary Rehab Program. Patient stated yes. Patient will come in for orientation on 10/09/2022 @ 1PM and will attend the 10:15AM exercise class.  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

## 2022-10-07 LAB — BASIC METABOLIC PANEL
BUN/Creatinine Ratio: 11 (ref 10–24)
BUN: 14 mg/dL (ref 8–27)
CO2: 27 mmol/L (ref 20–29)
Calcium: 8.8 mg/dL (ref 8.6–10.2)
Chloride: 102 mmol/L (ref 96–106)
Creatinine, Ser: 1.26 mg/dL (ref 0.76–1.27)
Glucose: 109 mg/dL — ABNORMAL HIGH (ref 70–99)
Potassium: 4.2 mmol/L (ref 3.5–5.2)
Sodium: 142 mmol/L (ref 134–144)
eGFR: 57 mL/min/{1.73_m2} — ABNORMAL LOW (ref >59)

## 2022-10-07 LAB — BRAIN NATRIURETIC PEPTIDE: BNP: 104.6 pg/mL — ABNORMAL HIGH (ref 0.0–100.0)

## 2022-10-08 ENCOUNTER — Ambulatory Visit (HOSPITAL_BASED_OUTPATIENT_CLINIC_OR_DEPARTMENT_OTHER): Payer: Medicare Other | Admitting: Family

## 2022-10-08 ENCOUNTER — Telehealth (HOSPITAL_COMMUNITY): Payer: Self-pay

## 2022-10-08 NOTE — Telephone Encounter (Signed)
Called to confirm appt. Pt confirmed appt. Instructed pt on proper footwear. Gave directions along with department number.

## 2022-10-09 ENCOUNTER — Encounter (HOSPITAL_COMMUNITY)
Admission: RE | Admit: 2022-10-09 | Discharge: 2022-10-09 | Disposition: A | Discharge status: Home / Self Care | Payer: Medicare Other | Source: Ambulatory Visit | Attending: Pulmonary Disease | Admitting: Pulmonary Disease

## 2022-10-09 ENCOUNTER — Encounter (HOSPITAL_COMMUNITY): Payer: Self-pay

## 2022-10-09 VITALS — BP 100/64 | HR 91 | Ht 66.0 in | Wt 169.5 lb

## 2022-10-09 DIAGNOSIS — I5032 Chronic diastolic (congestive) heart failure: Secondary | ICD-10-CM | POA: Diagnosis present

## 2022-10-09 NOTE — Progress Notes (Signed)
Pulmonary Rehab Orientation Physical Assessment Note  Physical assessment reveals patient is alert and oriented x 3.  Heart rate is normal, breath sounds diminished to auscultation, no wheezes, rales, or rhonchi. Patient reports non-productive cough. Bowel sounds present x4 quadrants.  Pt denies abdominal discomfort, nausea, vomiting or diarrhea. Grip strength equal, strong. Distal pulses +2; no swelling to lower extremities.   Janine Ores, RN, BSN

## 2022-10-09 NOTE — Progress Notes (Signed)
Pulmonary Individual Treatment Plan  Patient Details  Name: Frank Meyer MRN: [id number] Date of Birth: 05-16-1941 Referring Provider:   April Manson Pulmonary Rehab Walk Test from 10/09/2022 in [facility] for Heart, Vascular, & [employer]  Referring Provider Hunsucker       Initial Encounter Date:  Flowsheet Row Pulmonary Rehab Walk Test from 10/09/2022 in [facility] for Heart, Vascular, & [place]  Date 10/09/22       Visit Diagnosis: Chronic diastolic HF (heart failure) ([place])  Patient's Home Medications on Admission:   Current Outpatient Medications:    ANORO ELLIPTA 62.5-25 MCG/ACT AEPB, Inhale 1 puff into the lungs daily., Disp: , Rfl:    aspirin 81 MG EC tablet, Take 81 mg by mouth daily., Disp: , Rfl:    atorvastatin (LIPITOR) 20 MG tablet, Take 20 mg by mouth every evening., Disp: , Rfl:    azithromycin (ZITHROMAX) 250 MG tablet, Take 1 tablet (250 mg total) by mouth daily., Disp: 2 tablet, Rfl: 0   cholecalciferol (VITAMIN D) 1000 UNITS tablet, Take 1,000 Units by mouth daily., Disp: , Rfl:    donepezil (ARICEPT) 10 MG tablet, Take 1 tablet (10 mg total) by mouth daily. (Patient taking differently: Take 10 mg by mouth at bedtime.), Disp: 90 tablet, Rfl: 3   fexofenadine (ALLEGRA) 180 MG tablet, Take 180 mg by mouth daily., Disp: , Rfl:    finasteride (PROSCAR) 5 MG tablet, Take 5 mg by mouth at bedtime., Disp: , Rfl:    furosemide (LASIX) 20 MG tablet, Take 1 tablet (20 mg total) by mouth daily. (Patient taking differently: Take 40 mg by mouth daily. Per pt Dr. Silas Flood increased last week), Disp: 90 tablet, Rfl: 3   ipratropium (ATROVENT) 0.06 % nasal spray, Place 1-2 sprays into both nostrils See admin instructions. 2 sprays in the right nostril and 1 spray in the left nostril once daily, Disp: , Rfl:    ipratropium-albuterol (DUONEB) 0.5-2.5 (3) MG/3ML SOLN, Take 3 mLs by nebulization 2 (two) times daily., Disp:  540 mL, Rfl: 3   levETIRAcetam (KEPPRA) 250 MG tablet, Take 1-2 tablets (250-500 mg total) by mouth See admin instructions. Take 250 mg by mouth in the morning and 500 mg by mouth at night. (Patient taking differently: Take 250-500 mg by mouth See admin instructions. Take one tablet (250 mg) by mouth in the morning and two tablets (500 mg) by mouth at night.), Disp: 270 tablet, Rfl: 3   Melatonin 10 MG TABS, Take 20 mg by mouth at bedtime., Disp: , Rfl:    omeprazole (PRILOSEC) 40 MG capsule, Take 40 mg by mouth daily. , Disp: , Rfl:    OXYGEN, Inhale 2-5 L into the lungs as directed. 5lt during the day and 2lt at night, Disp: , Rfl:    sacubitril-valsartan (ENTRESTO) 24-26 MG, Take 1 tablet by mouth daily. (Patient taking differently: Take 1 tablet by mouth every evening.), Disp: 90 tablet, Rfl: 3   thiamine 100 MG tablet, Take 1 tablet (100 mg total) by mouth daily., Disp: 30 tablet, Rfl: 0   traZODone (DESYREL) 100 MG tablet, TAKE 2 TABLETS BY MOUTH AT  BEDTIME, Disp: 180 tablet, Rfl: 3   nitroGLYCERIN (NITROSTAT) 0.4 MG SL tablet, Place 1 tablet (0.4 mg total) under the tongue every 5 (five) minutes as needed. Chest pain (Patient taking differently: Place 0.4 mg under the tongue every 5 (five) minutes as needed for chest pain. Chest pain), Disp: 25 tablet,  Rfl: 6  Past Medical History: Past Medical History:  Diagnosis Date   Allergic rhinitis 10/24/2009   Altered bowel habits 10/04/2020   Basal cell carcinoma    Benign prostatic hyperplasia with lower urinary tract symptoms 10/24/2009   Benign prostatic hypertrophy    Bradycardia 12/06/2019   Callosity 10/16/2013   Cardiomegaly 01/23/2015   Chronic diastolic heart failure ([place]) 12/19/2018   Chronic respiratory failure ([employer]) 10/04/2020   Coronary artery calcification seen on CAT scan 05/28/2020   Dyslipidemia    Dyspnea on exertion 03/18/2018   Emphysema lung ([place]) 03/18/2018   Encounter for general adult medical examination without  abnormal findings 01/17/2015   Ex-cigarette smoker 01/20/2021   Exudative age-related macular degeneration of left eye with active choroidal neovascularization ([place]) 12/18/2019   Gallbladder problem    Gastroesophageal reflux disease    Hiatal hernia    Hilar adenopathy 03/25/2018   History of smoking 03/18/2018   Hyperlipidemia 10/24/2009   Insomnia 05/09/2013   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   Internal hemorrhoid 10/04/2020   Macular degeneration    Macular pucker, right eye 04/30/2020   Mediastinal adenopathy 03/25/2018   Memory loss 05/09/2013   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   Mild memory disturbance    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   Noninfective gastroenteritis and colitis, unspecified 10/04/2020   Nuclear sclerotic cataract of right eye 12/18/2019   Other specified symptoms and signs involving the digestive system and abdomen 10/04/2020   Overweight 09/14/2018   Oxygen dependent 10/04/2020   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   Personal history of colonic polyps 10/04/2020   Posterior vitreous detachment of right eye 12/18/2019   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   Pulmonary hypertension (HCC)    Right upper lobe pulmonary nodule 03/25/2018   Syncope and collapse 12/12/2018   TIA (transient ischemic attack)    Transient ischemic attack 10/24/2009   Weight decreased 10/04/2020    Tobacco Use: Social History   Tobacco Use  Smoking Status Former   Packs/day: 2.00   Years: 25.00   Total pack years: 50.00   Types: Cigarettes   Quit date: 1994   Years since quitting: 30.1  Smokeless Tobacco Never    Labs: Review Flowsheet       Latest Ref Rng & Units 06/13/2020 07/20/2022 08/02/2022 08/03/2022  Labs for ITP Cardiac and Pulmonary Rehab  Cholestrol 100 - 199 mg/dL 147  - - -  LDL (calc) 0 - 99 mg/dL 67  - - -  HDL-C >39 mg/dL 57  - - -  Trlycerides  0 - 149 mg/dL 129  - - -  PH, Arterial 7.35 - 7.45 - 7.402  - -  PCO2 arterial 32 - 48 mmHg - 37.0  - -  Bicarbonate 20.0 - 28.0 mmol/L - 23.1  21.2  29.2   TCO2 22 - 32 mmol/L - 24  23  -  Acid-base deficit 0.0 - 2.0 mmol/L - 1.0  6.0  -  O2 Saturation % - 95  71  32.6     Capillary Blood Glucose: Lab Results  Component Value Date   GLUCAP 134 (H) 07/22/2022   GLUCAP 193 (H) 07/21/2022   GLUCAP 100 (H) 12/12/2018   GLUCAP 101 (H) 04/08/2018     Pulmonary Assessment Scores:  Pulmonary Assessment Scores     Row Name 10/09/22 1336         ADL UCSD   ADL Phase Entry  SOB Score total 49       CAT Score   CAT Score 17       mMRC Score   mMRC Score 4             UCSD: Self-administered rating of dyspnea associated with activities of daily living (ADLs) 6-point scale (0 = "not at all" to 5 = "maximal or unable to do because of breathlessness")  Scoring Scores range from 0 to 120.  Minimally important difference is 5 units  CAT: CAT can identify the health impairment of COPD patients and is better correlated with disease progression.  CAT has a scoring range of zero to 40. The CAT score is classified into four groups of low (less than 10), medium (10 - 20), high (21-30) and very high (31-40) based on the impact level of disease on health status. A CAT score over 10 suggests significant symptoms.  A worsening CAT score could be explained by an exacerbation, poor medication adherence, poor inhaler technique, or progression of COPD or comorbid conditions.  CAT MCID is 2 points  mMRC: mMRC (Modified Medical Research Council) Dyspnea Scale is used to assess the degree of baseline functional disability in patients of respiratory disease due to dyspnea. No minimal important difference is established. A decrease in score of 1 point or greater is considered a positive change.   Pulmonary Function Assessment:  Pulmonary Function Assessment - 10/09/22 1513       Breath    Bilateral Breath Sounds Decreased    Shortness of Breath No;Limiting activity             Exercise Target Goals: Exercise Program Goal: Individual exercise prescription set using results from initial 6 min walk test and THRR while considering  patient's activity barriers and safety.   Exercise Prescription Goal: Initial exercise prescription builds to 30-45 minutes a day of aerobic activity, 2-3 days per week.  Home exercise guidelines will be given to patient during program as part of exercise prescription that the participant will acknowledge.  Activity Barriers & Risk Stratification:  Activity Barriers & Cardiac Risk Stratification - 10/09/22 1340       Activity Barriers & Cardiac Risk Stratification   Activity Barriers Shortness of Breath;Muscular Weakness;Deconditioning;Other (comment);Balance Concerns;History of Falls    Comments O2    Cardiac Risk Stratification High             6 Minute Walk:  6 Minute Walk     Row Name 10/09/22 1504         6 Minute Walk   Phase Initial     Distance 730 feet     Walk Time 6 minutes     # of Rest Breaks 1  2:40-4:10 due to hypoxia at 82 on 8L/40 FiO2     MPH 1.38     METS 1.06     RPE 9     Perceived Dyspnea  0     VO2 Peak 3.7     Symptoms No  pt sts not SOB     Resting HR 91 bpm     Resting BP 100/64     Resting Oxygen Saturation  90 %     Exercise Oxygen Saturation  during 6 min walk 82 %     Max Ex. HR 105 bpm     Max Ex. BP 94/54     2 Minute Post BP 96/68       Interval HR   1 Minute HR 95  2 Minute HR 104     3 Minute HR 105     4 Minute HR 97     5 Minute HR 96     6 Minute HR 105     2 Minute Post HR 91     Interval Heart Rate? Yes       Interval Oxygen   Interval Oxygen? Yes     Baseline Oxygen Saturation % 90 %     1 Minute Oxygen Saturation % 95 %     1 Minute Liters of Oxygen 8 L     2 Minute Oxygen Saturation % 91 %     2 Minute Liters of Oxygen 8 L     3 Minute Oxygen Saturation % 85  %     3 Minute Liters of Oxygen 10 L     4 Minute Oxygen Saturation % 90 %     4 Minute Liters of Oxygen 10 L     5 Minute Oxygen Saturation % 98 %     5 Minute Liters of Oxygen 10 L     6 Minute Oxygen Saturation % 94 %     6 Minute Liters of Oxygen 10 L     2 Minute Post Oxygen Saturation % 94 %     2 Minute Post Liters of Oxygen 10 L              Oxygen Initial Assessment:  Oxygen Initial Assessment - 10/09/22 1332       Home Oxygen   Home Oxygen Device Portable Concentrator;Home Concentrator;E-Tanks    Sleep Oxygen Prescription Continuous    Liters per minute 2    Home Exercise Oxygen Prescription Continuous    Liters per minute 6    Home Resting Oxygen Prescription Continuous    Liters per minute 5   only due to not being able to change concentrator setting without walking over and back when resting. Prescription is 2L per pt   Compliance with Home Oxygen Use Yes      Initial 6 min Walk   Oxygen Used Continuous    Liters per minute 10    FiO2% 45   venturi     Program Oxygen Prescription   Program Oxygen Prescription Continuous    Liters per minute 10    FiO2% 45    Comments venturi      Intervention   Short Term Goals To learn and exhibit compliance with exercise, home and travel O2 prescription;To learn and understand importance of maintaining oxygen saturations>88%;To learn and demonstrate proper use of respiratory medications;To learn and understand importance of monitoring SPO2 with pulse oximeter and demonstrate accurate use of the pulse oximeter.;To learn and demonstrate proper pursed lip breathing techniques or other breathing techniques.     Long  Term Goals Demonstrates proper use of MDI's;Compliance with respiratory medication;Exhibits proper breathing techniques, such as pursed lip breathing or other method taught during program session;Maintenance of O2 saturations>88%;Verbalizes importance of monitoring SPO2 with pulse oximeter and return  demonstration;Exhibits compliance with exercise, home  and travel O2 prescription             Oxygen Re-Evaluation:   Oxygen Discharge (Final Oxygen Re-Evaluation):   Initial Exercise Prescription:  Initial Exercise Prescription - 10/09/22 1500       Date of Initial Exercise RX and Referring Provider   Date 10/09/22    Referring Provider Hunsucker    Expected Discharge Date 01/07/23      Oxygen  Oxygen Continuous    Liters 10L/45 FiO2    Maintain Oxygen Saturation 88% or higher      Recumbant Bike   Level 1    RPM 70    Minutes 15    METs 1.5      NuStep   Level 1    SPM 70    Minutes 15    METs 1.5      Prescription Details   Frequency (times per week) 2    Duration Progress to 30 minutes of continuous aerobic without signs/symptoms of physical distress      Intensity   THRR 40-80% of Max Heartrate 56-111    Ratings of Perceived Exertion 11-13    Perceived Dyspnea 0-4      Progression   Progression Continue to progress workloads to maintain intensity without signs/symptoms of physical distress.      Resistance Training   Training Prescription Yes    Weight red bands    Reps 10-15             Perform Capillary Blood Glucose checks as needed.  Exercise Prescription Changes:   Exercise Comments:   Exercise Goals and Review:   Exercise Goals     Row Name 10/09/22 1335             Exercise Goals   Increase Physical Activity Yes       Intervention Provide advice, education, support and counseling about physical activity/exercise needs.;Develop an individualized exercise prescription for aerobic and resistive training based on initial evaluation findings, risk stratification, comorbidities and participant's personal goals.       Expected Outcomes Short Term: Attend rehab on a regular basis to increase amount of physical activity.;Long Term: Add in home exercise to make exercise part of routine and to increase amount of physical  activity.;Long Term: Exercising regularly at least 3-5 days a week.       Increase Strength and Stamina Yes       Intervention Provide advice, education, support and counseling about physical activity/exercise needs.;Develop an individualized exercise prescription for aerobic and resistive training based on initial evaluation findings, risk stratification, comorbidities and participant's personal goals.       Expected Outcomes Short Term: Increase workloads from initial exercise prescription for resistance, speed, and METs.;Short Term: Perform resistance training exercises routinely during rehab and add in resistance training at home;Long Term: Improve cardiorespiratory fitness, muscular endurance and strength as measured by increased METs and functional capacity (6MWT)       Able to understand and use rate of perceived exertion (RPE) scale Yes       Intervention Provide education and explanation on how to use RPE scale       Expected Outcomes Short Term: Able to use RPE daily in rehab to express subjective intensity level;Long Term:  Able to use RPE to guide intensity level when exercising independently       Able to understand and use Dyspnea scale Yes       Intervention Provide education and explanation on how to use Dyspnea scale       Expected Outcomes Short Term: Able to use Dyspnea scale daily in rehab to express subjective sense of shortness of breath during exertion;Long Term: Able to use Dyspnea scale to guide intensity level when exercising independently       Knowledge and understanding of Target Heart Rate Range (THRR) Yes       Intervention Provide education and explanation of THRR including how the numbers  were predicted and where they are located for reference       Expected Outcomes Short Term: Able to state/look up THRR;Long Term: Able to use THRR to govern intensity when exercising independently;Short Term: Able to use daily as guideline for intensity in rehab       Understanding of  Exercise Prescription Yes       Intervention Provide education, explanation, and written materials on patient's individual exercise prescription       Expected Outcomes Short Term: Able to explain program exercise prescription;Long Term: Able to explain home exercise prescription to exercise independently                Exercise Goals Re-Evaluation :   Discharge Exercise Prescription (Final Exercise Prescription Changes):   Nutrition:  Target Goals: Understanding of nutrition guidelines, daily intake of sodium '1500mg'$ , cholesterol '200mg'$ , calories 30% from fat and 7% or less from saturated fats, daily to have 5 or more servings of fruits and vegetables.  Biometrics:    Nutrition Therapy Plan and Nutrition Goals:   Nutrition Assessments:  MEDIFICTS Score Key: ?70 Need to make dietary changes  40-70 Heart Healthy Diet ? 40 Therapeutic Level Cholesterol Diet   Picture Your Plate Scores: [id number] Unhealthy dietary pattern with much room for improvement. 41-50 Dietary pattern unlikely to meet recommendations for good health and room for improvement. 51-60 More healthful dietary pattern, with some room for improvement.  >60 Healthy dietary pattern, although there may be some specific behaviors that could be improved.    Nutrition Goals Re-Evaluation:   Nutrition Goals Discharge (Final Nutrition Goals Re-Evaluation):   Psychosocial: Target Goals: Acknowledge presence or absence of significant depression and/or stress, maximize coping skills, provide positive support system. Participant is able to verbalize types and ability to use techniques and skills needed for reducing stress and depression.  Initial Review & Psychosocial Screening:  Initial Psych Review & Screening - 10/09/22 1328       Initial Review   Current issues with None Identified   meds have controlled sleep issues     Family Dynamics   Good Support System? Yes    Comments wife, daughter      Screening  Interventions   Interventions Encouraged to exercise    Expected Outcomes Short Term goal: Utilizing psychosocial counselor, staff and physician to assist with identification of specific Stressors or current issues interfering with healing process. Setting desired goal for each stressor or current issue identified.;Long Term Goal: Stressors or current issues are controlled or eliminated.;Short Term goal: Identification and review with participant of any Quality of Life or Depression concerns found by scoring the questionnaire.;Long Term goal: The participant improves quality of Life and PHQ9 Scores as seen by post scores and/or verbalization of changes             Quality of Life Scores:  Scores of 19 and below usually indicate a poorer quality of life in these areas.  A difference of  2-3 points is a clinically meaningful difference.  A difference of 2-3 points in the total score of the Quality of Life Index has been associated with significant improvement in overall quality of life, self-image, physical symptoms, and general health in studies assessing change in quality of life.  PHQ-9: Review Flowsheet       10/09/2022  Depression screen PHQ 2/9  Decreased Interest 0  Down, Depressed, Hopeless 0  PHQ - 2 Score 0  Altered sleeping 0  Tired, decreased energy 0  Change in  appetite 0  Feeling bad or failure about yourself  0  Trouble concentrating 0  Moving slowly or fidgety/restless 0  Suicidal thoughts 0  PHQ-9 Score 0  Difficult doing work/chores Not difficult at all   Interpretation of Total Score  Total Score Depression Severity:  1-4 = Minimal depression, 5-9 = Mild depression, 10-14 = Moderate depression, 15-19 = Moderately severe depression, 20-27 = Severe depression   Psychosocial Evaluation and Intervention:  Psychosocial Evaluation - 10/09/22 1330       Psychosocial Evaluation & Interventions   Interventions Stress management education;Relaxation  education;Encouraged to exercise with the program and follow exercise prescription    Expected Outcomes Pt to participate in [place]  No Follow up required             Psychosocial Re-Evaluation:   Psychosocial Discharge (Final Psychosocial Re-Evaluation):   Education: Education Goals: Education classes will be provided on a weekly basis, covering required topics. Participant will state understanding/return demonstration of topics presented.  Learning Barriers/Preferences:  Learning Barriers/Preferences - 10/09/22 1330       Learning Barriers/Preferences   Learning Barriers Exercise Concerns   hypoxia   Learning Preferences Audio;Verbal Instruction;Individual Instruction             Education Topics: Introduction to Pulmonary Rehab Group instruction provided by PowerPoint, verbal discussion, and written material to support subject matter. Instructor reviews what Pulmonary Rehab is, the purpose of the program, and how patients are referred.     Know Your Numbers Group instruction that is supported by a PowerPoint presentation. Instructor discusses importance of knowing and understanding resting, exercise, and post-exercise oxygen saturation, heart rate, and blood pressure. Oxygen saturation, heart rate, blood pressure, rating of perceived exertion, and dyspnea are reviewed along with a normal range for these values.    Exercise for the Pulmonary Patient Group instruction that is supported by a PowerPoint presentation. Instructor discusses benefits of exercise, core components of exercise, frequency, duration, and intensity of an exercise routine, importance of utilizing pulse oximetry during exercise, safety while exercising, and options of places to exercise outside of rehab.       MET Level  Group instruction provided by PowerPoint, verbal discussion, and written material to support subject matter. Instructor reviews what METs are and  how to increase METs.    Pulmonary Medications Verbally interactive group education provided by instructor with focus on inhaled medications and proper administration.   Anatomy and Physiology of the Respiratory System Group instruction provided by PowerPoint, verbal discussion, and written material to support subject matter. Instructor reviews respiratory cycle and anatomical components of the respiratory system and their functions. Instructor also reviews differences in obstructive and restrictive respiratory diseases with examples of each.    Oxygen Safety Group instruction provided by PowerPoint, verbal discussion, and written material to support subject matter. There is an overview of "What is Oxygen" and "Why do we need it".  Instructor also reviews how to create a safe environment for oxygen use, the importance of using oxygen as prescribed, and the risks of noncompliance. There is a brief discussion on traveling with oxygen and resources the patient may utilize.   Oxygen Use Group instruction provided by PowerPoint, verbal discussion, and written material to discuss how supplemental oxygen is prescribed and different types of oxygen supply systems. Resources for more information are provided.    Breathing Techniques Group instruction that is supported by demonstration and informational handouts. Instructor discusses the benefits of pursed lip  and diaphragmatic breathing and detailed demonstration on how to perform both.     Risk Factor Reduction Group instruction that is supported by a PowerPoint presentation. Instructor discusses the definition of a risk factor, different risk factors for pulmonary disease, and how the heart and lungs work together.   MD Day A group question and answer session with a medical doctor that allows participants to ask questions that relate to their pulmonary disease state.   Nutrition for the Pulmonary Patient Group instruction provided by  PowerPoint slides, verbal discussion, and written materials to support subject matter. The instructor gives an explanation and review of healthy diet recommendations, which includes a discussion on weight management, recommendations for fruit and vegetable consumption, as well as protein, fluid, caffeine, fiber, sodium, sugar, and alcohol. Tips for eating when patients are short of breath are discussed.    Other Education Group or individual verbal, written, or video instructions that support the educational goals of the pulmonary rehab program.    Knowledge Questionnaire Score:  Knowledge Questionnaire Score - 10/09/22 1512       Knowledge Questionnaire Score   Pre Score 16/18             Core Components/Risk Factors/Patient Goals at Admission:  Personal Goals and Risk Factors at Admission - 10/09/22 1331       Core Components/Risk Factors/Patient Goals on Admission   Improve shortness of breath with ADL's Yes    Intervention Provide education, individualized exercise plan and daily activity instruction to help decrease symptoms of SOB with activities of daily living.    Expected Outcomes Short Term: Improve cardiorespiratory fitness to achieve a reduction of symptoms when performing ADLs;Long Term: Be able to perform more ADLs without symptoms or delay the onset of symptoms    Heart Failure Yes    Intervention Provide a combined exercise and nutrition program that is supplemented with education, support and counseling about heart failure. Directed toward relieving symptoms such as shortness of breath, decreased exercise tolerance, and extremity edema.    Expected Outcomes Improve functional capacity of life;Short term: Attendance in program 2-3 days a week with increased exercise capacity. Reported lower sodium intake. Reported increased fruit and vegetable intake. Reports medication compliance.;Short term: Daily weights obtained and reported for increase. Utilizing diuretic protocols  set by physician.;Long term: Adoption of self-care skills and reduction of barriers for early signs and symptoms recognition and intervention leading to self-care maintenance.             Core Components/Risk Factors/Patient Goals Review:    Core Components/Risk Factors/Patient Goals at Discharge (Final Review):    ITP Comments:   Comments: Dr. Rodman Pickle is Medical Director for Pulmonary Rehab at [facility].

## 2022-10-09 NOTE — Progress Notes (Signed)
   10/09/22 1504  6 Minute Walk  Phase Initial  Distance 730 feet  Walk Time 6 minutes  # of Rest Breaks 1 (2:40-4:10 due to hypoxia at 82 on 8L/40 FiO2)  MPH 1.38  METS 1.06  RPE 9  Perceived Dyspnea  0  VO2 Peak 3.7  Symptoms No (pt sts not SOB)  Resting HR 91 bpm  Resting BP 100/64  Resting Oxygen Saturation  90 %  Exercise Oxygen Saturation  during 6 min walk 82 %  Max Ex. HR 105 bpm  Max Ex. BP 94/54  2 Minute Post BP 96/68  Interval HR  Interval Heart Rate? Yes  1 Minute HR 95  2 Minute HR 104  3 Minute HR 105  4 Minute HR 97  5 Minute HR 96  6 Minute HR 105  2 Minute Post HR 91  Interval Oxygen  Interval Oxygen? Yes  Baseline Oxygen Saturation % 90 %  1 Minute Oxygen Saturation % 95 %  1 Minute Liters of Oxygen 8 L  2 Minute Oxygen Saturation % 91 %  2 Minute Liters of Oxygen 8 L  3 Minute Oxygen Saturation % 85 %  3 Minute Liters of Oxygen 10 L  4 Minute Oxygen Saturation % 90 %  4 Minute Liters of Oxygen 10 L  5 Minute Oxygen Saturation % 98 %  5 Minute Liters of Oxygen 10 L  6 Minute Oxygen Saturation % 94 %  6 Minute Liters of Oxygen 10 L  2 Minute Post Oxygen Saturation % 94 %  2 Minute Post Liters of Oxygen 10 L

## 2022-10-09 NOTE — Progress Notes (Signed)
Frank Meyer 82 y.o. male Pulmonary Rehab Orientation Note This patient who was referred to Pulmonary Rehab by Dr. Silas Flood with the diagnosis of chronic diastolic heart failure arrived today in Cardiac and Pulmonary Rehab. He arrived ambulatory with normal gait. He does carry portable oxygen. [employer] is the provider for their DME. Per patient, Frank Meyer uses oxygen continuously. Color good, skin warm and dry. Patient is oriented to time and place. Patient's medical history, psychosocial health, and medications reviewed. Psychosocial assessment reveals patient lives with spouse. Frank Meyer is currently retired. Patient hobbies include  golf however is unable to play now . Patient reports his stress level is moderate. Areas of stress/anxiety include health. Patient does not exhibit signs of depression.  PHQ2/9 score 0/0. Ah shows fair  coping skills with positive outlook on life. Offered emotional support and reassurance. Will continue to monitor. Physical assessment performed by Frank Ores RN. Please see their orientation physical assessment note. Frank Meyer reports he  does take medications as prescribed. Patient states he  follows a regular  diet. The patient reports no specific efforts to gain or lose weight.. Patient's weight will be monitored closely. Demonstration and practice of PLB using pulse oximeter. Frank Meyer able to return demonstration satisfactorily. Safety and hand hygiene in the exercise area reviewed with patient. [name] voices understanding of the information reviewed. Department expectations discussed with patient and achievable goals were set. The patient shows enthusiasm about attending the program and we look forward to working with Frank Meyer. Frank Meyer completed a 6 min walk test today and is scheduled to begin exercise on 10/15/22 at 1015.   [id number] [name], MS, ACSM-CEP

## 2022-10-11 ENCOUNTER — Other Ambulatory Visit: Payer: Self-pay | Admitting: Cardiology

## 2022-10-11 ENCOUNTER — Other Ambulatory Visit: Payer: Self-pay | Admitting: Neurology

## 2022-10-12 LAB — LAB REPORT - SCANNED: EGFR: 58.1

## 2022-10-12 NOTE — Telephone Encounter (Signed)
Refill to pharmacy 

## 2022-10-13 ENCOUNTER — Telehealth: Payer: Self-pay | Admitting: Pulmonary Disease

## 2022-10-13 DIAGNOSIS — J9611 Chronic respiratory failure with hypoxia: Secondary | ICD-10-CM

## 2022-10-13 NOTE — Telephone Encounter (Signed)
[facility] and she will call me back at my desk.

## 2022-10-13 NOTE — Telephone Encounter (Signed)
Called and spoke with [facility] from pulmonary rehab. Stanton Kidney stated that while pt was at rehab, they had to place pt on 10L O2 on a venti mask.  Stanton Kidney wanted to know if an oximyzer could be ordered for pt to have at home. She also stated that pt currently has a POC that only goes up to 6 so due to this, with how much oxygen pt required, it seems like pt will need to have tanks to replace the POC.   Stanton Kidney also stated that pt told her that his home concentrator only goes up to 8L so based off of how much oxygen pt required at pulmonary rehab, it seems like pt will need to have an order placed for a larger home concentrator.  Dr. Silas Flood, please advise on all this for [facility] and pt what you recommend.

## 2022-10-13 NOTE — Telephone Encounter (Signed)
Nurse Stanton Kidney from Pulmonary Rehab [phone] or Direct [phone] . Pls call regarding this PT.

## 2022-10-14 NOTE — Telephone Encounter (Signed)
Called Mary back at [facility] rehab and got patients stats when he was doing his exercises to place the order for new concentrator and venti mask. Approved by Dr Silas Flood as well. Nothing further needed

## 2022-10-14 NOTE — Telephone Encounter (Signed)
Patient's wife is calling again and said that [place] said they did not get the order for the patient's oxygen.  She gave a fax# that the order can be sent to:  [phone]. Please advise and call patient to give him an update.

## 2022-10-14 NOTE — Telephone Encounter (Signed)
Yes need new order for new concentrator for 10 L  Ok for venti mask   Please inform us of final exertional O2 need and we can order tanks at that point.

## 2022-10-14 NOTE — Telephone Encounter (Signed)
Called and spoke with Frank Meyer. She is aware that the order has been placed but just needs to be sent to [employer]. She is aware that I will ask the P[facility] to send the order. She verbalized understanding.   PCCs, can we send the oxygen order that was placed earlier today to [employer]? Thanks!

## 2022-10-15 ENCOUNTER — Other Ambulatory Visit: Payer: Self-pay

## 2022-10-15 ENCOUNTER — Encounter (HOSPITAL_COMMUNITY)
Admission: RE | Admit: 2022-10-15 | Discharge: 2022-10-15 | Disposition: A | Discharge status: Home / Self Care | Payer: Medicare Other | Source: Ambulatory Visit | Attending: Pulmonary Disease | Admitting: Pulmonary Disease

## 2022-10-15 DIAGNOSIS — I5032 Chronic diastolic (congestive) heart failure: Secondary | ICD-10-CM | POA: Diagnosis not present

## 2022-10-15 DIAGNOSIS — J9611 Chronic respiratory failure with hypoxia: Secondary | ICD-10-CM

## 2022-10-15 NOTE — Progress Notes (Signed)
Daily Session Note  Patient Details  Name: Frank Meyer MRN: [id number] Date of Birth: 09-23-1940 Referring Provider:   April Manson Pulmonary Rehab Walk Test from 10/09/2022 in [facility] [place]  Referring Provider Hunsucker       Encounter Date: 10/15/2022  Check In:  Session Check In - 10/15/22 1123       Check-In   Supervising physician immediately available to respond to emergencies [facility] MD immediately available    Physician(s) Dr Cyd Silence    Location [facility]    Staff Present Janine Ores, RN, BSN;Randi Olen Cordial BS, ACSM-CEP, Exercise Physiologist;Bailey Pearline Cables, MS, Exercise Physiologist;Kaylee Rosana Hoes, MS, ACSM-CEP, Exercise Physiologist;Other    Virtual Visit No    Medication changes reported     No    Fall or balance concerns reported    No    Tobacco Cessation No Change    Warm-up and Cool-down Performed as group-led instruction   orientation only   Resistance Training Performed Yes    VAD Patient? No    PAD/SET Patient? No      Pain Assessment   Currently in Pain? No/denies    Multiple Pain Sites No             Capillary Blood Glucose: No results found for this or any previous visit (from the past 24 hour(s)).    Social History   Tobacco Use  Smoking Status Former   Packs/day: 2.00   Years: 25.00   Total pack years: 50.00   Types: Cigarettes   Quit date: 53   Years since quitting: 30.1  Smokeless Tobacco Never    Goals Met:  Proper associated with RPD/PD & O2 Sat Independence with exercise equipment Exercise tolerated well No report of concerns or symptoms today Strength training completed today  Goals Unmet:  Not Applicable  Comments: Service time is from 1004 to 1140.    Dr. Rodman Pickle is Medical Director for Pulmonary Rehab at [facility]

## 2022-10-20 ENCOUNTER — Other Ambulatory Visit: Payer: Self-pay | Admitting: *Deleted

## 2022-10-20 ENCOUNTER — Encounter (HOSPITAL_COMMUNITY)
Admission: RE | Admit: 2022-10-20 | Discharge: 2022-10-20 | Disposition: A | Discharge status: Home / Self Care | Payer: Medicare Other | Source: Ambulatory Visit | Attending: Pulmonary Disease | Admitting: Pulmonary Disease

## 2022-10-20 DIAGNOSIS — I5032 Chronic diastolic (congestive) heart failure: Secondary | ICD-10-CM | POA: Insufficient documentation

## 2022-10-20 DIAGNOSIS — J439 Emphysema, unspecified: Secondary | ICD-10-CM | POA: Diagnosis not present

## 2022-10-20 DIAGNOSIS — Z5189 Encounter for other specified aftercare: Secondary | ICD-10-CM | POA: Insufficient documentation

## 2022-10-20 MED ORDER — DONEPEZIL HCL 10 MG PO TABS: 10.0000 mg | ORAL_TABLET | Freq: Every day | ORAL | 3 refills | Status: DC

## 2022-10-20 NOTE — Progress Notes (Signed)
Daily Session Note  Patient Details  Name: Frank Meyer MRN: [id number] Date of Birth: Dec 10, 1940 Referring Provider:   April Manson Pulmonary Rehab Walk Test from 10/09/2022 in [facility] V[facility], [facility]  Referring Provider Hunsucker       Encounter Date: 10/20/2022  Check In:  Session Check In - 10/20/22 1200       Check-In   Supervising physician immediately available to respond to emergencies [facility] MD immediately available    Physician(s) Dr Gala Romney    Location [facility]    Staff Present Janine Ores, RN, BSN;Randi Olen Cordial BS, ACSM-CEP, Exercise Physiologist;Kaylee Rosana Hoes, MS, ACSM-CEP, Exercise Physiologist;Other    Virtual Visit No    Medication changes reported     No    Fall or balance concerns reported    No    Tobacco Cessation No Change    Warm-up and Cool-down Performed as group-led instruction   orientation only   Resistance Training Performed Yes    VAD Patient? No    PAD/SET Patient? No      Pain Assessment   Currently in Pain? No/denies    Multiple Pain Sites No             Capillary Blood Glucose: No results found for this or any previous visit (from the past 24 hour(s)).    Social History   Tobacco Use  Smoking Status Former   Packs/day: 2.00   Years: 25.00   Total pack years: 50.00   Types: Cigarettes   Quit date: 74   Years since quitting: 30.1  Smokeless Tobacco Never    Goals Met:  Proper associated with RPD/PD & O2 Sat Independence with exercise equipment Exercise tolerated well No report of concerns or symptoms today Strength training completed today  Goals Unmet:  Not Applicable  Comments: Service time is from 1013 to 1140.    Dr. Rodman Pickle is Medical Director for Pulmonary Rehab at [facility].

## 2022-10-22 ENCOUNTER — Encounter (HOSPITAL_COMMUNITY)
Admission: RE | Admit: 2022-10-22 | Discharge: 2022-10-22 | Disposition: A | Discharge status: Home / Self Care | Payer: Medicare Other | Source: Ambulatory Visit | Attending: Pulmonary Disease | Admitting: Pulmonary Disease

## 2022-10-22 ENCOUNTER — Other Ambulatory Visit: Payer: Self-pay

## 2022-10-22 VITALS — Wt 169.1 lb

## 2022-10-22 DIAGNOSIS — Z5189 Encounter for other specified aftercare: Secondary | ICD-10-CM | POA: Diagnosis not present

## 2022-10-22 DIAGNOSIS — J9611 Chronic respiratory failure with hypoxia: Secondary | ICD-10-CM

## 2022-10-22 DIAGNOSIS — I5032 Chronic diastolic (congestive) heart failure: Secondary | ICD-10-CM

## 2022-10-22 NOTE — Progress Notes (Signed)
Daily Session Note  Patient Details  Name: Frank Meyer MRN: [id number] Date of Birth: 1940/10/09 Referring Provider:   April Manson Pulmonary Rehab Walk Test from 10/09/2022 in [facility] for [facility], Vascular, & [facility]  Referring Provider Hunsucker       Encounter Date: 10/22/2022  Check In:  Session Check In - 10/22/22 1154       Check-In   Supervising physician immediately available to respond to emergencies [facility] MD immediately available    Physician(s) Dr Vaughan Browner    Location [facility]    Staff Present Janine Ores, RN, BSN;Randi Olen Cordial BS, ACSM-CEP, Exercise Physiologist;Cyle Kenyon Rosana Hoes, MS, ACSM-CEP, Exercise Physiologist;Other;Samantha [place], RD, LDN    Virtual Visit No    Medication changes reported     No    Fall or balance concerns reported    No    Tobacco Cessation No Change    Warm-up and Cool-down Performed as group-led instruction   orientation only   Resistance Training Performed Yes    VAD Patient? No    PAD/SET Patient? No      Pain Assessment   Currently in Pain? No/denies    Multiple Pain Sites No             Capillary Blood Glucose: No results found for this or any previous visit (from the past 24 hour(s)).   Exercise Prescription Changes - 10/22/22 1200       Response to Exercise   Blood Pressure (Admit) 110/70    Blood Pressure (Exercise) 118/68    Blood Pressure (Exit) 98/60    Heart Rate (Admit) 94 bpm    Heart Rate (Exercise) 92 bpm    Heart Rate (Exit) 87 bpm    Oxygen Saturation (Admit) 93 %    Oxygen Saturation (Exercise) 90 %   15L @ 55%   Oxygen Saturation (Exit) 93 %    Rating of Perceived Exertion (Exercise) 11    Perceived Dyspnea (Exercise) 1    Duration Progress to 30 minutes of  aerobic without signs/symptoms of physical distress    Intensity THRR unchanged      Progression   Progression Continue to progress workloads to maintain intensity without signs/symptoms of physical  distress.      Resistance Training   Training Prescription Yes    Weight red bands    Reps 10-15    Time 10 Minutes      Oxygen   Oxygen Continuous    Liters 15L/55 FiO2      Recumbant Bike   Level 1    Minutes 15    METs 1.8      NuStep   Level 3    SPM 80    Minutes 15    METs 2.4      Oxygen   Maintain Oxygen Saturation 88% or higher             Social History   Tobacco Use  Smoking Status Former   Packs/day: 2.00   Years: 25.00   Total pack years: 50.00   Types: Cigarettes   Quit date: 8   Years since quitting: 30.2  Smokeless Tobacco Never    Goals Met:  Exercise tolerated well No report of concerns or symptoms today Strength training completed today  Goals Unmet:  Not Applicable  Comments: Service time is from 1027 to 1145.    Dr. Rodman Pickle is Medical Director for Pulmonary Rehab at [facility].

## 2022-10-26 ENCOUNTER — Other Ambulatory Visit: Payer: Self-pay | Admitting: Pulmonary Disease

## 2022-10-27 ENCOUNTER — Emergency Department (HOSPITAL_COMMUNITY): Payer: Medicare Other

## 2022-10-27 ENCOUNTER — Encounter (HOSPITAL_COMMUNITY): Payer: Medicare Other

## 2022-10-27 ENCOUNTER — Inpatient Hospital Stay (HOSPITAL_COMMUNITY)
Admission: EM | Admit: 2022-10-27 | Discharge: 2022-10-30 | DRG: 193 | Disposition: A | Discharge status: Home / Self Care | Payer: Medicare Other | Attending: Family Medicine | Admitting: Family Medicine

## 2022-10-27 ENCOUNTER — Other Ambulatory Visit: Payer: Self-pay

## 2022-10-27 ENCOUNTER — Encounter (HOSPITAL_COMMUNITY): Payer: Self-pay

## 2022-10-27 DIAGNOSIS — Z515 Encounter for palliative care: Secondary | ICD-10-CM

## 2022-10-27 DIAGNOSIS — R000 Tachycardia, unspecified: Secondary | ICD-10-CM | POA: Diagnosis present

## 2022-10-27 DIAGNOSIS — Z79899 Other long term (current) drug therapy: Secondary | ICD-10-CM

## 2022-10-27 DIAGNOSIS — D649 Anemia, unspecified: Secondary | ICD-10-CM | POA: Diagnosis present

## 2022-10-27 DIAGNOSIS — Z66 Do not resuscitate: Secondary | ICD-10-CM | POA: Diagnosis present

## 2022-10-27 DIAGNOSIS — I5032 Chronic diastolic (congestive) heart failure: Secondary | ICD-10-CM | POA: Diagnosis present

## 2022-10-27 DIAGNOSIS — J189 Pneumonia, unspecified organism: Secondary | ICD-10-CM | POA: Diagnosis not present

## 2022-10-27 DIAGNOSIS — J441 Chronic obstructive pulmonary disease with (acute) exacerbation: Secondary | ICD-10-CM | POA: Diagnosis not present

## 2022-10-27 DIAGNOSIS — Z9981 Dependence on supplemental oxygen: Secondary | ICD-10-CM

## 2022-10-27 DIAGNOSIS — J431 Panlobular emphysema: Secondary | ICD-10-CM | POA: Diagnosis present

## 2022-10-27 DIAGNOSIS — Z85828 Personal history of other malignant neoplasm of skin: Secondary | ICD-10-CM

## 2022-10-27 DIAGNOSIS — I2510 Atherosclerotic heart disease of native coronary artery without angina pectoris: Secondary | ICD-10-CM | POA: Diagnosis present

## 2022-10-27 DIAGNOSIS — Z87891 Personal history of nicotine dependence: Secondary | ICD-10-CM

## 2022-10-27 DIAGNOSIS — N179 Acute kidney failure, unspecified: Secondary | ICD-10-CM | POA: Diagnosis present

## 2022-10-27 DIAGNOSIS — R590 Localized enlarged lymph nodes: Secondary | ICD-10-CM | POA: Diagnosis present

## 2022-10-27 DIAGNOSIS — Z7951 Long term (current) use of inhaled steroids: Secondary | ICD-10-CM

## 2022-10-27 DIAGNOSIS — J9611 Chronic respiratory failure with hypoxia: Secondary | ICD-10-CM | POA: Diagnosis present

## 2022-10-27 DIAGNOSIS — J440 Chronic obstructive pulmonary disease with acute lower respiratory infection: Secondary | ICD-10-CM | POA: Diagnosis present

## 2022-10-27 DIAGNOSIS — E785 Hyperlipidemia, unspecified: Secondary | ICD-10-CM | POA: Diagnosis present

## 2022-10-27 DIAGNOSIS — J9601 Acute respiratory failure with hypoxia: Secondary | ICD-10-CM | POA: Diagnosis not present

## 2022-10-27 DIAGNOSIS — K219 Gastro-esophageal reflux disease without esophagitis: Secondary | ICD-10-CM | POA: Diagnosis present

## 2022-10-27 DIAGNOSIS — N401 Enlarged prostate with lower urinary tract symptoms: Secondary | ICD-10-CM | POA: Diagnosis present

## 2022-10-27 DIAGNOSIS — Z1152 Encounter for screening for COVID-19: Secondary | ICD-10-CM

## 2022-10-27 DIAGNOSIS — Z8673 Personal history of transient ischemic attack (TIA), and cerebral infarction without residual deficits: Secondary | ICD-10-CM

## 2022-10-27 DIAGNOSIS — J9621 Acute and chronic respiratory failure with hypoxia: Secondary | ICD-10-CM | POA: Diagnosis present

## 2022-10-27 DIAGNOSIS — J159 Unspecified bacterial pneumonia: Secondary | ICD-10-CM | POA: Diagnosis not present

## 2022-10-27 DIAGNOSIS — E861 Hypovolemia: Secondary | ICD-10-CM | POA: Diagnosis present

## 2022-10-27 DIAGNOSIS — R569 Unspecified convulsions: Secondary | ICD-10-CM | POA: Diagnosis present

## 2022-10-27 DIAGNOSIS — Z7982 Long term (current) use of aspirin: Secondary | ICD-10-CM

## 2022-10-27 DIAGNOSIS — I272 Pulmonary hypertension, unspecified: Secondary | ICD-10-CM | POA: Diagnosis present

## 2022-10-27 DIAGNOSIS — G3184 Mild cognitive impairment, so stated: Secondary | ICD-10-CM | POA: Diagnosis present

## 2022-10-27 DIAGNOSIS — Z9049 Acquired absence of other specified parts of digestive tract: Secondary | ICD-10-CM

## 2022-10-27 DIAGNOSIS — Z86010 Personal history of colonic polyps: Secondary | ICD-10-CM

## 2022-10-27 DIAGNOSIS — J9622 Acute and chronic respiratory failure with hypercapnia: Secondary | ICD-10-CM | POA: Diagnosis present

## 2022-10-27 DIAGNOSIS — Z961 Presence of intraocular lens: Secondary | ICD-10-CM | POA: Diagnosis present

## 2022-10-27 DIAGNOSIS — G4700 Insomnia, unspecified: Secondary | ICD-10-CM | POA: Diagnosis present

## 2022-10-27 LAB — RESP PANEL BY RT-PCR (RSV, FLU A&B, COVID)  RVPGX2
Influenza A by PCR: NEGATIVE
Influenza B by PCR: NEGATIVE
Resp Syncytial Virus by PCR: NEGATIVE
SARS Coronavirus 2 by RT PCR: NEGATIVE

## 2022-10-27 LAB — BASIC METABOLIC PANEL
Anion gap: 14 (ref 5–15)
BUN: 17 mg/dL (ref 8–23)
CO2: 27 mmol/L (ref 22–32)
Calcium: 9.1 mg/dL (ref 8.9–10.3)
Chloride: 98 mmol/L (ref 98–111)
Creatinine, Ser: 1.43 mg/dL — ABNORMAL HIGH (ref 0.61–1.24)
GFR, Estimated: 49 mL/min — ABNORMAL LOW (ref >60)
Glucose, Bld: 106 mg/dL — ABNORMAL HIGH (ref 70–99)
Potassium: 4.1 mmol/L (ref 3.5–5.1)
Sodium: 139 mmol/L (ref 135–145)

## 2022-10-27 LAB — CBC WITH DIFFERENTIAL/PLATELET
Abs Immature Granulocytes: 0.1 10*3/uL — ABNORMAL HIGH (ref 0.00–0.07)
Basophils Absolute: 0.2 10*3/uL — ABNORMAL HIGH (ref 0.0–0.1)
Basophils Relative: 1 %
Eosinophils Absolute: 0.8 10*3/uL — ABNORMAL HIGH (ref 0.0–0.5)
Eosinophils Relative: 4 %
HCT: 42.1 % (ref 39.0–52.0)
Hemoglobin: 13.3 g/dL (ref 13.0–17.0)
Immature Granulocytes: 1 %
Lymphocytes Relative: 21 %
Lymphs Abs: 3.7 10*3/uL (ref 0.7–4.0)
MCH: 27.4 pg (ref 26.0–34.0)
MCHC: 31.6 g/dL (ref 30.0–36.0)
MCV: 86.6 fL (ref 80.0–100.0)
Monocytes Absolute: 1.1 10*3/uL — ABNORMAL HIGH (ref 0.1–1.0)
Monocytes Relative: 6 %
Neutro Abs: 11.7 10*3/uL — ABNORMAL HIGH (ref 1.7–7.7)
Neutrophils Relative %: 67 %
Platelets: 311 10*3/uL (ref 150–400)
RBC: 4.86 MIL/uL (ref 4.22–5.81)
RDW: 14.8 % (ref 11.5–15.5)
WBC: 17.6 10*3/uL — ABNORMAL HIGH (ref 4.0–10.5)
nRBC: 0 % (ref 0.0–0.2)

## 2022-10-27 LAB — I-STAT VENOUS BLOOD GAS, ED
Acid-Base Excess: 3 mmol/L — ABNORMAL HIGH (ref 0.0–2.0)
Bicarbonate: 30.8 mmol/L — ABNORMAL HIGH (ref 20.0–28.0)
Calcium, Ion: 1.24 mmol/L (ref 1.15–1.40)
HCT: 42 % (ref 39.0–52.0)
Hemoglobin: 14.3 g/dL (ref 13.0–17.0)
O2 Saturation: 45 %
Potassium: 4.1 mmol/L (ref 3.5–5.1)
Sodium: 139 mmol/L (ref 135–145)
TCO2: 33 mmol/L — ABNORMAL HIGH (ref 22–32)
pCO2, Ven: 61.4 mmHg — ABNORMAL HIGH (ref 44–60)
pH, Ven: 7.309 (ref 7.25–7.43)
pO2, Ven: 28 mmHg — CL (ref 32–45)

## 2022-10-27 LAB — TROPONIN I (HIGH SENSITIVITY)
Troponin I (High Sensitivity): 6 ng/L (ref <18)
Troponin I (High Sensitivity): 6 ng/L (ref <18)

## 2022-10-27 LAB — LACTIC ACID, PLASMA
Lactic Acid, Venous: 2.2 mmol/L (ref 0.5–1.9)
Lactic Acid, Venous: 1.3 mmol/L (ref 0.5–1.9)

## 2022-10-27 LAB — BRAIN NATRIURETIC PEPTIDE: B Natriuretic Peptide: 125.4 pg/mL — ABNORMAL HIGH (ref 0.0–100.0)

## 2022-10-27 MED ORDER — ALBUTEROL SULFATE HFA 108 (90 BASE) MCG/ACT IN AERS: 2.0000 | INHALATION_SPRAY | RESPIRATORY_TRACT | Status: DC | PRN

## 2022-10-27 MED ORDER — SODIUM CHLORIDE 0.9 % IV SOLN
1.0000 g | Freq: Once | INTRAVENOUS | Status: AC
  Administered 2022-10-27: 1 g via INTRAVENOUS
  Filled 2022-10-27: qty 10

## 2022-10-27 MED ORDER — MELATONIN 10 MG PO TABS: 20.0000 mg | ORAL_TABLET | Freq: Every day | ORAL | Status: DC

## 2022-10-27 MED ORDER — LEVETIRACETAM 500 MG PO TABS
500.0000 mg | ORAL_TABLET | Freq: Every evening | ORAL | Status: DC
  Administered 2022-10-27 – 2022-10-29 (×3): 500 mg via ORAL
  Filled 2022-10-27 (×3): qty 1

## 2022-10-27 MED ORDER — DONEPEZIL HCL 10 MG PO TABS
10.0000 mg | ORAL_TABLET | Freq: Every day | ORAL | Status: DC
  Administered 2022-10-27 – 2022-10-30 (×4): 10 mg via ORAL
  Filled 2022-10-27 (×4): qty 1

## 2022-10-27 MED ORDER — LACTATED RINGERS IV BOLUS: 1000.0000 mL | Freq: Once | INTRAVENOUS | Status: DC

## 2022-10-27 MED ORDER — LORATADINE 10 MG PO TABS
10.0000 mg | ORAL_TABLET | Freq: Every day | ORAL | Status: DC
  Administered 2022-10-27 – 2022-10-30 (×4): 10 mg via ORAL
  Filled 2022-10-27 (×4): qty 1

## 2022-10-27 MED ORDER — ACETAMINOPHEN 325 MG PO TABS: 650.0000 mg | ORAL_TABLET | Freq: Four times a day (QID) | ORAL | Status: DC | PRN

## 2022-10-27 MED ORDER — TRAZODONE HCL 100 MG PO TABS
200.0000 mg | ORAL_TABLET | Freq: Every day | ORAL | Status: DC
  Administered 2022-10-27 – 2022-10-29 (×3): 200 mg via ORAL
  Filled 2022-10-27: qty 2
  Filled 2022-10-27: qty 4
  Filled 2022-10-27: qty 2

## 2022-10-27 MED ORDER — LACTATED RINGERS IV BOLUS
1000.0000 mL | Freq: Once | INTRAVENOUS | Status: AC
  Administered 2022-10-27: 1000 mL via INTRAVENOUS

## 2022-10-27 MED ORDER — METHYLPREDNISOLONE SODIUM SUCC 125 MG IJ SOLR
125.0000 mg | Freq: Once | INTRAMUSCULAR | Status: AC
  Administered 2022-10-27: 125 mg via INTRAVENOUS
  Filled 2022-10-27: qty 2

## 2022-10-27 MED ORDER — FINASTERIDE 5 MG PO TABS
5.0000 mg | ORAL_TABLET | Freq: Every day | ORAL | Status: DC
  Administered 2022-10-27 – 2022-10-29 (×3): 5 mg via ORAL
  Filled 2022-10-27 (×3): qty 1

## 2022-10-27 MED ORDER — ENOXAPARIN SODIUM 40 MG/0.4ML IJ SOSY
40.0000 mg | PREFILLED_SYRINGE | INTRAMUSCULAR | Status: DC
  Administered 2022-10-27 – 2022-10-30 (×4): 40 mg via SUBCUTANEOUS
  Filled 2022-10-27 (×4): qty 0.4

## 2022-10-27 MED ORDER — IPRATROPIUM-ALBUTEROL 0.5-2.5 (3) MG/3ML IN SOLN
3.0000 mL | Freq: Once | RESPIRATORY_TRACT | Status: AC
  Administered 2022-10-27: 3 mL via RESPIRATORY_TRACT
  Filled 2022-10-27: qty 3

## 2022-10-27 MED ORDER — SODIUM CHLORIDE 0.9 % IV SOLN
1.0000 g | Freq: Every day | INTRAVENOUS | Status: DC
  Administered 2022-10-28 – 2022-10-29 (×2): 1 g via INTRAVENOUS
  Filled 2022-10-27 (×3): qty 10

## 2022-10-27 MED ORDER — ACETAMINOPHEN 650 MG RE SUPP: 650.0000 mg | Freq: Four times a day (QID) | RECTAL | Status: DC | PRN

## 2022-10-27 MED ORDER — LEVETIRACETAM 250 MG PO TABS: 250.0000 mg | ORAL_TABLET | ORAL | Status: DC

## 2022-10-27 MED ORDER — UMECLIDINIUM-VILANTEROL 62.5-25 MCG/ACT IN AEPB
1.0000 | INHALATION_SPRAY | Freq: Two times a day (BID) | RESPIRATORY_TRACT | Status: DC
  Administered 2022-10-28 – 2022-10-30 (×4): 1 via RESPIRATORY_TRACT
  Filled 2022-10-27 (×2): qty 14

## 2022-10-27 MED ORDER — LEVETIRACETAM 250 MG PO TABS
250.0000 mg | ORAL_TABLET | Freq: Every morning | ORAL | Status: DC
  Administered 2022-10-28 – 2022-10-30 (×3): 250 mg via ORAL
  Filled 2022-10-27 (×3): qty 1

## 2022-10-27 MED ORDER — POLYETHYLENE GLYCOL 3350 17 G PO PACK: 17.0000 g | PACK | Freq: Every day | ORAL | Status: DC | PRN

## 2022-10-27 MED ORDER — ATORVASTATIN CALCIUM 10 MG PO TABS
20.0000 mg | ORAL_TABLET | Freq: Every evening | ORAL | Status: DC
  Administered 2022-10-27 – 2022-10-29 (×3): 20 mg via ORAL
  Filled 2022-10-27 (×3): qty 2

## 2022-10-27 MED ORDER — IPRATROPIUM BROMIDE 0.06 % NA SOLN
1.0000 | Freq: Every day | NASAL | Status: DC
  Administered 2022-10-29: 1 via NASAL
  Administered 2022-10-30: 2 via NASAL
  Filled 2022-10-27: qty 15

## 2022-10-27 MED ORDER — ONDANSETRON HCL 4 MG PO TABS: 4.0000 mg | ORAL_TABLET | Freq: Four times a day (QID) | ORAL | Status: DC | PRN

## 2022-10-27 MED ORDER — IOHEXOL 350 MG/ML SOLN
75.0000 mL | Freq: Once | INTRAVENOUS | Status: AC | PRN
  Administered 2022-10-27: 75 mL via INTRAVENOUS

## 2022-10-27 MED ORDER — ONDANSETRON HCL 4 MG/2ML IJ SOLN: 4.0000 mg | Freq: Four times a day (QID) | INTRAMUSCULAR | Status: DC | PRN

## 2022-10-27 MED ORDER — ASPIRIN 81 MG PO TBEC
81.0000 mg | DELAYED_RELEASE_TABLET | Freq: Every day | ORAL | Status: DC
  Administered 2022-10-27 – 2022-10-30 (×4): 81 mg via ORAL
  Filled 2022-10-27 (×4): qty 1

## 2022-10-27 MED ORDER — IPRATROPIUM-ALBUTEROL 0.5-2.5 (3) MG/3ML IN SOLN
3.0000 mL | Freq: Two times a day (BID) | RESPIRATORY_TRACT | Status: DC
  Administered 2022-10-27 – 2022-10-30 (×6): 3 mL via RESPIRATORY_TRACT
  Filled 2022-10-27 (×7): qty 3

## 2022-10-27 MED ORDER — MELATONIN 5 MG PO TABS
20.0000 mg | ORAL_TABLET | Freq: Every day | ORAL | Status: DC
  Administered 2022-10-28 – 2022-10-29 (×3): 20 mg via ORAL
  Filled 2022-10-27 (×3): qty 4

## 2022-10-27 MED ORDER — SODIUM CHLORIDE 0.9 % IV SOLN
500.0000 mg | Freq: Every day | INTRAVENOUS | Status: AC
  Administered 2022-10-28 – 2022-10-29 (×2): 500 mg via INTRAVENOUS
  Filled 2022-10-27 (×3): qty 5

## 2022-10-27 MED ORDER — SODIUM CHLORIDE 0.9 % IV SOLN
500.0000 mg | Freq: Once | INTRAVENOUS | Status: AC
  Administered 2022-10-27: 500 mg via INTRAVENOUS
  Filled 2022-10-27: qty 5

## 2022-10-27 NOTE — ED Provider Notes (Signed)
[facility] Provider Note   CSN: [id number] Arrival date & time: 10/27/22  1017     History  Chief Complaint  Patient presents with   Shortness of Breath    Frank Meyer is a 82 y.o. male with a past medical history significant for COPD with chronic hypoxic respiratory failure, chronic diastolic CHF, memory loss, and history of seizures who presents to the ED due to low oxygen saturations.  Patient states he was getting ready for pulmonary rehab when his oxygen was in the 40s to 50s.  Patient is on chronic 8 L nasal cannula at rest and 10 L nasal cannula with any physical activity.  Patient states he has felt more short of breath over the past few days and significantly worse early this morning.  He admits to a productive cough that has been ongoing for the past few months.  No fever.  Denies history of blood clots recent surgeries, recent long immobilizations, or hormonal treatments.  Patient does note he is pretty stationary at home and typically sits in his recliner chair most of the time.  Denies lower extremity edema.  Last echocardiogram showed an EF of 55 to 60%.  Patient has a history of COPD and notes he takes breathing treatments twice daily.  Had no breathing treatment prior to arrival.  History obtained from patient and past medical records. No interpreter used during encounter.       Home Medications Prior to Admission medications   Medication Sig Start Date End Date Taking? Authorizing Provider  Jearl Klinefelter ELLIPTA 62.5-25 MCG/ACT AEPB USE 1 INHALATION BY MOUTH DAILY 10/26/22   Hunsucker, Bonna Gains, MD  aspirin 81 MG EC tablet Take 81 mg by mouth daily.    [provider]  atorvastatin (LIPITOR) 20 MG tablet Take 20 mg by mouth every evening.    [provider]  azithromycin (ZITHROMAX) 250 MG tablet Take 1 tablet (250 mg total) by mouth daily. 09/16/22   Thurnell Lose, MD  cholecalciferol (VITAMIN D) 1000  UNITS tablet Take 1,000 Units by mouth daily.    [provider]  donepezil (ARICEPT) 10 MG tablet Take 1 tablet (10 mg total) by mouth daily. 10/20/22   Alric Ran, MD  ENTRESTO 24-26 MG TAKE 1 TABLET BY MOUTH DAILY 10/12/22   Park Liter, MD  fexofenadine (ALLEGRA) 180 MG tablet Take 180 mg by mouth daily.    [provider]  finasteride (PROSCAR) 5 MG tablet Take 5 mg by mouth at bedtime.    [provider]  furosemide (LASIX) 20 MG tablet Take 1 tablet (20 mg total) by mouth daily. Patient taking differently: Take 40 mg by mouth daily. Per pt Dr. Silas Flood increased last week 09/29/22 09/24/23  Loel Dubonnet, NP  ipratropium (ATROVENT) 0.06 % nasal spray Place 1-2 sprays into both nostrils See admin instructions. 2 sprays in the right nostril and 1 spray in the left nostril once daily 02/09/13   [provider]  ipratropium-albuterol (DUONEB) 0.5-2.5 (3) MG/3ML SOLN Take 3 mLs by nebulization 2 (two) times daily. 08/25/22   Hunsucker, Bonna Gains, MD  levETIRAcetam (KEPPRA) 250 MG tablet Take 1-2 tablets (250-500 mg total) by mouth See admin instructions. Take 250 mg by mouth in the morning and 500 mg by mouth at night. Patient taking differently: Take 250-500 mg by mouth See admin instructions. Take one tablet (250 mg) by mouth in the morning and two tablets (500 mg) by  mouth at night. 07/30/22 10/28/22  Alric Ran, MD  Melatonin 10 MG TABS Take 20 mg by mouth at bedtime.    [provider]  nitroGLYCERIN (NITROSTAT) 0.4 MG SL tablet Place 1 tablet (0.4 mg total) under the tongue every 5 (five) minutes as needed. Chest pain Patient taking differently: Place 0.4 mg under the tongue every 5 (five) minutes as needed for chest pain. Chest pain 07/15/21 09/11/22  Park Liter, MD  omeprazole (PRILOSEC) 40 MG capsule Take 40 mg by mouth daily.  03/29/13   [provider]  OXYGEN Inhale 2-5 L into the lungs as directed. 5lt during the  day and 2lt at night    [provider]  thiamine 100 MG tablet Take 1 tablet (100 mg total) by mouth daily. 12/13/18   Geradine Girt, DO  traZODone (DESYREL) 100 MG tablet TAKE 2 TABLETS BY MOUTH AT  BEDTIME 08/25/22   Suzzanne Cloud, NP      Allergies    Patient has no known allergies.    Review of Systems   Review of Systems  Constitutional:  Negative for chills and fever.  Respiratory:  Positive for cough and shortness of breath.   Cardiovascular:  Negative for chest pain and leg swelling.  Gastrointestinal:  Negative for abdominal pain.    Physical Exam Updated Vital Signs BP 108/72   Pulse 86   Temp 98.7 F (37.1 C)   Resp 16   SpO2 100%  Physical Exam Vitals and nursing note reviewed.  Constitutional:      General: He is not in acute distress.    Appearance: He is not ill-appearing.  HENT:     Head: Normocephalic.  Eyes:     Pupils: Pupils are equal, round, and reactive to light.  Cardiovascular:     Rate and Rhythm: Regular rhythm. Tachycardia present.     Pulses: Normal pulses.     Heart sounds: Normal heart sounds. No murmur heard.    No friction rub. No gallop.  Pulmonary:     Comments: 8L HFNC Abdominal:     General: Abdomen is flat. There is no distension.     Palpations: Abdomen is soft.     Tenderness: There is no abdominal tenderness. There is no guarding or rebound.  Musculoskeletal:        General: Normal range of motion.     Cervical back: Neck supple.     Comments: No lower extremity edema. Negative homan sign bilaterally.  Skin:    General: Skin is warm and dry.  Neurological:     General: No focal deficit present.     Mental Status: He is alert.  Psychiatric:        Mood and Affect: Mood normal.        Behavior: Behavior normal.     ED Results / Procedures / Treatments   Labs (all labs ordered are listed, but only abnormal results are displayed) Labs Reviewed  CBC WITH DIFFERENTIAL/PLATELET - Abnormal; Notable for the  following components:      Result Value   WBC 17.6 (*)    Neutro Abs 11.7 (*)    Monocytes Absolute 1.1 (*)    Eosinophils Absolute 0.8 (*)    Basophils Absolute 0.2 (*)    Abs Immature Granulocytes 0.10 (*)    All other components within normal limits  BASIC METABOLIC PANEL - Abnormal; Notable for the following components:   Glucose, Bld 106 (*)    Creatinine, Ser  1.43 (*)    GFR, Estimated 49 (*)    All other components within normal limits  BRAIN NATRIURETIC PEPTIDE - Abnormal; Notable for the following components:   B Natriuretic Peptide 125.4 (*)    All other components within normal limits  LACTIC ACID, PLASMA - Abnormal; Notable for the following components:   Lactic Acid, Venous 2.2 (*)    All other components within normal limits  I-STAT VENOUS BLOOD GAS, ED - Abnormal; Notable for the following components:   pCO2, Ven 61.4 (*)    pO2, Ven 28 (*)    Bicarbonate 30.8 (*)    TCO2 33 (*)    Acid-Base Excess 3.0 (*)    All other components within normal limits  RESP PANEL BY RT-PCR (RSV, FLU A&B, COVID)  RVPGX2  CULTURE, BLOOD (ROUTINE X 2)  CULTURE, BLOOD (ROUTINE X 2)  LACTIC ACID, PLASMA  TROPONIN I (HIGH SENSITIVITY)  TROPONIN I (HIGH SENSITIVITY)    EKG EKG Interpretation  Date/Time:  Tuesday October 27 2022 10:04:34 EDT Ventricular Rate:  122 PR Interval:  156 QRS Duration: 66 QT Interval:  296 QTC Calculation: 421 R Axis:   131 Text Interpretation: Interpretation limited secondary to artifact Sinus tachycardia Left posterior fascicular block Cannot rule out Anterior infarct , age undetermined ST & T wave abnormality, consider inferior ischemia Abnormal ECG When compared with ECG of 10-Sep-2022 17:30, PREVIOUS ECG IS PRESENT Confirmed by Margaretmary Eddy [phone]) on 10/27/2022 11:31:32 AM  Radiology CT Angio Chest PE W and/or Wo Contrast  Result Date: 10/27/2022 CLINICAL DATA:  Pulmonary embolism (PE) suspected, high prob EXAM: CT ANGIOGRAPHY CHEST WITH  CONTRAST TECHNIQUE: Multidetector CT imaging of the chest was performed using the standard protocol during bolus administration of intravenous contrast. Multiplanar CT image reconstructions and MIPs were obtained to evaluate the vascular anatomy. RADIATION DOSE REDUCTION: This exam was performed according to the departmental dose-optimization program which includes automated exposure control, adjustment of the mA and/or kV according to patient size and/or use of iterative reconstruction technique. CONTRAST:  37m OMNIPAQUE IOHEXOL 350 MG/ML SOLN COMPARISON:  Chest CT 09/23/2022, CT 01/24/2019. FINDINGS: Cardiovascular: Satisfactory opacification of the pulmonary arteries to the segmental level. No evidence of pulmonary embolism. Unchanged cardiomegaly.Trace pericardial effusion. Coronary artery atherosclerosis. Mild atherosclerosis of the thoracic aorta. Mediastinum/Nodes: There are multiple prominent mediastinal lymph nodes, for reference a right paratracheal lymph node measuring 1.1 cm (series 5, image 43), and a precarinal lymph node measuring up to 1.2 cm (series 5, image 57). This previously measured 0.8 and 0.9 cm respectively. The thyroid is unremarkable. Lungs/Pleura: Severe centrilobular and paraseptal emphysema. There is increased diffuse ground-glass and new consolidative airspace disease in the lung bases and peripheral posterior aspect of the right upper lobe. Similar 1.2 x 1.2 cm nodular opacity in the right apex with architectural distortion. Adjacent apical consolidation and bronchiectasis, similar to prior exams dating back to at least June 2020. Right apical nodular areas have been waxing waning over multiple prior exams over this time interval. Trace pleural effusions. No evidence of pneumothorax. Upper Abdomen: Postsurgical changes in the upper abdomen. No acute findings. Musculoskeletal: No acute osseous abnormality. No suspicious osseous lesion. T2 vertebral body hemangioma. Benign bone island  in T3. No aggressive osseous lesion. Review of the MIP images confirms the above findings. IMPRESSION: No evidence of pulmonary embolism. Severe pulmonary emphysema. Increased diffuse ground-glass and new consolidative airspace disease in the lung bases and peripheral posterior aspect of the right upper lobe, concerning for pneumonia. Trace  pleural effusions and trace pericardial effusion. Recommend follow-up chest CT in 3 months. Unchanged right apical pleuroparenchymal scarring with waxing and waning nodular features dating back to at least June 2020. Multiple prominent mediastinal lymph nodes, favored to be reactive. Recommend attention on follow-up. Electronically Signed   By: Maurine Simmering M.D.   On: 10/27/2022 13:37   DG Chest Portable 1 View  Result Date: 10/27/2022 CLINICAL DATA:  Shortness of breath. EXAM: PORTABLE CHEST 1 VIEW COMPARISON:  09/15/2022 FINDINGS: Advanced changes of emphysema. Architectural distortion/scarring in the right upper and lower lung as well as left base is similar to prior. No evidence for pulmonary edema or pleural effusion. Cardiopericardial silhouette is at upper limits of normal for size. The visualized bony structures of the thorax are unremarkable. Telemetry leads overlie the chest. IMPRESSION: Chronic architectural distortion/scarring in both lungs, as before. No new or acute cardiopulmonary findings. Electronically Signed   By: Misty Stanley M.D.   On: 10/27/2022 11:25    Procedures .Critical Care  Performed by: Suzy Bouchard, PA-C Authorized by: Suzy Bouchard, PA-C   Critical care provider statement:    Critical care time (minutes):  31   Critical care was necessary to treat or prevent imminent or life-threatening deterioration of the following conditions:  Respiratory failure   Critical care was time spent personally by me on the following activities:  Development of treatment plan with patient or surrogate, discussions with consultants, evaluation  of patient's response to treatment, examination of patient, ordering and review of laboratory studies, ordering and review of radiographic studies, ordering and performing treatments and interventions, pulse oximetry, re-evaluation of patient's condition and review of old charts   I assumed direction of critical care for this patient from another provider in my specialty: no     Care discussed with: admitting provider       Medications Ordered in ED Medications  cefTRIAXone (ROCEPHIN) 1 g in sodium chloride 0.9 % 100 mL IVPB (has no administration in time range)  lactated ringers bolus 1,000 mL (has no administration in time range)  methylPREDNISolone sodium succinate (SOLU-MEDROL) 125 mg/2 mL injection 125 mg (125 mg Intravenous Given 10/27/22 1129)  ipratropium-albuterol (DUONEB) 0.5-2.5 (3) MG/3ML nebulizer solution 3 mL (3 mLs Nebulization Given 10/27/22 1114)  azithromycin (ZITHROMAX) 500 mg in sodium chloride 0.9 % 250 mL IVPB (500 mg Intravenous New Bag/Given 10/27/22 1240)  lactated ringers bolus 1,000 mL (1,000 mLs Intravenous New Bag/Given 10/27/22 1242)  iohexol (OMNIPAQUE) 350 MG/ML injection 75 mL (75 mLs Intravenous Contrast Given 10/27/22 1308)    ED Course/ Medical Decision Making/ A&P Clinical Course as of 10/27/22 1442  Tue Oct 27, 2022  1141 WBC(!): 17.6 [CA]  1143 Leukocytosis of 17.6. IV antibiotics started. CTA chest to rule out PNA and PE.  [CA]  1229 Lactic Acid, Venous(!!): 2.2 [CA]  1229 B Natriuretic Peptide(!): 125.4 Lactic acid at 2.2, BP downtrending. IVFs started [CA]  1252 Reassessed patient at bedside.  Patient admits to significant improvement in shortness of breath.  BP slightly low, IV fluids running.  Awaiting CTA.  Patient will require admission. [CA]  1414 Rechecked BP with improvement after IVFs, Will give 1 more liter [CA]    Clinical Course User Index [CA] Suzy Bouchard, PA-C                             Medical Decision Making Amount and/or  Complexity of Data Reviewed Independent Historian:  spouse External Data Reviewed: notes. Labs: ordered. Decision-making details documented in ED Course. Radiology: ordered and independent interpretation performed. Decision-making details documented in ED Course. ECG/medicine tests: ordered and independent interpretation performed. Decision-making details documented in ED Course.  Risk Prescription drug management. Decision regarding hospitalization.   This patient presents to the ED for concern of SOB, this involves an extensive number of treatment options, and is a complaint that carries with it a high risk of complications and morbidity.  The differential diagnosis includes COPD exacerbation, CHF exacerbation, PE, ACS, pneumonia, etc  82 year old male presents to the ED due to worsening shortness of breath.  History of COPD, CHF, and pulmonary hypertension.  Patient on chronic 8 L nasal cannula at rest and 10 L with any physical exertion.  No fever or chills.  Upon arrival, patient found to be hypoxic in the 70s at triage.  During initial evaluation patient on 8 L high flow nasal cannula with O2 saturation above 95%. Patient afebrile.  Lungs with some rales.  No wheeze however, patient notes he does not typically wheeze with his COPD exacerbations.  No lower extremity edema. No history of blood clots. Negative Homan sign bilaterally.  Routine labs ordered.  BNP to rule out CHF exacerbation.  Chest x-ray to rule out evidence of pneumonia.  Troponin rule out ACS.  RVP to rule out infection.  Steroids and DuoNeb given for possible COPD exacerbation.  Discussed with Dr. Philip Aspen who evaluated patient during initial evaluation and agrees with assessment and plan.  CBC significant for leukocytosis at 17.6.  Normal hemoglobin.  BMP significant for elevated creatinine 1.43.  No major electrolyte derangements.  BNP mildly elevated 125.  Chest x-ray demonstrates chronic scarring however, no acute  abnormalities.  COVID/Influenza negative.  Troponin normal. EKG sinus tachycardia with artifact. Low suspicion for ACS. Lactic acid normal at 1.3.  CTA To rule out PE vs. PNA.  CTA negative for PE, but does demonstrate PNA. Antibiotics and IVFs already started. Suspect symptoms secondary to COPD exacerbation and PNA.   2:37 PM Discussed with Dr. Sabra Heck who agrees to admit patient.   Has PCP       Final Clinical Impression(s) / ED Diagnoses Final diagnoses:  COPD exacerbation ([place])  Pneumonia of right upper lobe due to infectious organism    Rx / DC Orders ED Discharge Orders     None         Karie Kirks 10/27/22 1445    Fransico Meadow, MD 10/27/22 [phone]

## 2022-10-27 NOTE — ED Triage Notes (Addendum)
Pt BIBPOV for SOB, intermittently wears oxygen at home, was on 10 LPM, and at home when his oxygen levels were in the 40s-50s. He was trying to get dressed for Pulmonary rehab, when he got very short of breath and could not slow his breathing down. In triage oxygen was 70% on 6LPM. Patient appeared cyanotic. Pt speaking in broken sentences, tachypnic, and unable to "catch my breath" per pt.   Hx pulmonary htn

## 2022-10-27 NOTE — ED Notes (Signed)
Wife at bedside and patient provided with warm blanket and pillow.

## 2022-10-27 NOTE — Hospital Course (Addendum)
Frank Meyer is a 82 y.o.male with a history of chronic respiratory failure, pan-lobar emphysema, former tobacco use, chronic diastolic heart failure, pulmonary HTN, seizure, memory loss, history of TIA who was admitted to the [facility] Medicine Teaching Service at [facility] for acute hypoxic respiratory failure due to pneumonia. His hospital course is detailed below:  Acute hypoxic respiratory failure ([place]) Suspect secondary to RUL CAP pneumonia. CXR showed chronic scarring, no new or acute cardiopulmonary findings. CTA with severe pulmonary emphysema, concern for RUL PNA. No evidence of PE. Patient was weaned to 3L HFNC, less than his home 8-10L of O2. He appeared hypovolemic on admission so he was given 2L fluid bolus. For CAP, he was given rocephin (3/12-3/14) and azithromycin (3/12-3/14). He was de-escalated to cefdinir for 3/15 with plan to complete course on 3/16. He was also started on prednisone 40mg  daily (3/14-3/18). He follows with pulmonology outpatient (Dr. Silas Flood) and was restarted on home medications of Anora Ellipta, Allegra, Atrovent nasal spray. He was continued on duonebs Q6H. Palliative care was also consulted and patient's goals were to treat the treatable and continue DNR/DNI. He was open to outpatient palliative care support. He ambulated to 95% with 8L of O2 prior to discharge.  Chronic diastolic CHF (congestive heart failure) (HCC) BNP 125.4 on admission. On lasix 40mg  daily at home per med rec and prior admission discharge summary but patient reports his lasix dosing has been continually modified after each visit with cardiology, pulmonology, and his PCP. Cr normalized during this admission. Seen by cardiology outpatient who recommended reducing lasix to 20mg  daily. Suspected acute on chronic diastolic HF as cause of SOB during 08/2022 admission. Last discharge weight ~73 kg and weight on admission was 76kg. He was restarted on lasix 40 QOD.   Stable chronic conditions: Insomnia:  Continued melatonin and trazodone nightly GERD: Continued Prilosec 40 mg HLD: Continued Lipitor 20 mg Seizures continued Keppra 250 mg a.m., 500 mg nightly BPH: Continued finasteride 5 mg Memory loss: Continue donepezil 10 mg  Other chronic conditions were medically managed with home medications and formulary alternatives as necessary  PCP Follow-up Recommendations: Follow-up chest CT 3 months Close follow up to monitor volume status on Lasix 40mg  every other day 3.   Follow-up anemia outpatient

## 2022-10-27 NOTE — Progress Notes (Signed)
FMTS Brief Progress Note  S:Seen at bedside in the ED with Dr. Ruben Im. Very pleasant and with no new complaints.    O: BP 115/67   Pulse 82   Temp 98.3 F (36.8 C) (Axillary)   Resp 18   SpO2 100%   Gen: In good spirits, comfortable and NAD, watching basketball Pulm: Normal WOB on 6L HFNC, speaking in full sentences, breath sounds diminished throughout, markedly prolonged expiratory phase, no wheeze.   A/P: 82 yo with significant pan lobar emphysema on 8L O2 at home here with pneumonia superimposed on his chronic lung disease. Stable on 6L HFNC at this time. Receiving Ceftriaxone and Azithromycin.  - Continue antibiotic coverage - Wean O2 to home 8-10 L LFNC as tolerated - OOB q4h while awake   Remainder of plan per day team's note.    Eppie Gibson, MD 10/27/2022, 8:45 PM PGY-2, [place] Night Resident  Please page [phone] with questions.

## 2022-10-27 NOTE — Assessment & Plan Note (Deleted)
Patient appears euvolemic on exam s/p 1 L bolus in the ED.   - Hold home 40 mg Lasix daily - Monitor volume status - Daily weights - Strict I's and O's - Regular diet, volume restriction 1800 mL

## 2022-10-27 NOTE — Assessment & Plan Note (Addendum)
2/2 to pneumonia. Patient on 3L [place] overnight. Started 5 day course of abx and steroids yesterday. Follows with pulmonology outpatient. Palliative care rec continue treat the treatable, confirmed DNR/DNI. Palliative will f/u outpatient. De-escalated antibiotics to PO. Ambulate with pulse ox today - Continue antibiotics  - Rocephin (3/12 - 3/14), cefdinir 300 Q12H (3/15-3/17)  - Azithromycin (3/12 - 3/14) - Continue prednisone 40mg  daily (3/14-3/18) - palliative care following, appreciate recs  - Wean to home 8-10L [place] as able  - Continue DuoNebs every 6 hours - Continue home medications: Anora Ellipta, Allegra, Atrovent nasal spray - low threshold to consult pulmonology if respiratory status worsens

## 2022-10-27 NOTE — ED Notes (Signed)
Provider notified of lactic acid level 2.2

## 2022-10-27 NOTE — H&P (Addendum)
Hospital Admission History and Physical Service Pager: [phone]  Patient name: Frank Meyer Medical record number: [id number] Date of Birth: 1941/04/25 Age: 82 y.o. Gender: male  Primary Care Provider: Burnard Bunting, MD Consultants: None Code Status: DNR  Preferred Emergency Contact:  Contact Information     Name Relation Home Work Mobile   [facility] Spouse [phone]  [phone]       Chief Complaint: SOB  Assessment and Plan: Frank Meyer is a 82 y.o. male presenting with acute hypoxic respiratory failure. Differential for this patient's presentation of this includes pneumonia, PE, viral illness.  Pneumonia seen on CT scan and also rule out PE.  Quad screen negative for COVID, RSV, flu in the ED however also possible to have superimposed pneumonia on previous viral infection.  Patient is severely ill and does not have a good prognosis.   * Acute hypoxic respiratory failure (HCC) 2/2 to pneumonia.  Patient is satting well in the ED on home O2 of 8 L at rest and 10 L on exertions.  May need prolonged antibiotic course due to severity of pulmonary disease, most likely 7 day course.  - Admit to FMTS, attending Dr. Nori Riis - Progressive, Vital signs per floor - Regular diet, 1800 fluid restriction - PT/OT to treat - VTE prophylaxis Lovenox - Fluids: Status post 1 L bolus, ordering second bolus  - Continue antibiotics  - Rocephin (3/12 - )  - Azithromycin (3/12 - ) - AM CBC/BMP  - Fall precautions - Consult to palliative care for goals of care discussion - Continue DuoNebs every 6 hours - Continue home medications: Anora Ellipta, Allegra, Atrovent    Stable chronic conditions: Insomnia: Continue melatonin and trazodone nightly GERD: Continue Prilosec 40 mg HLD: Continue Lipitor 20 mg Seizures continue Keppra 250 mg a.m., 500 mg nightly BPH: Continue finasteride 5 mg Memory loss: Continue donepezil 10 mg   FEN/GI: Regular diet, fluid restriction  1800 mL VTE Prophylaxis: Lovenox  Disposition: Progressive, attending Dr. Nori Riis  History of Present Illness:  Frank Meyer is a 82 y.o. male presenting with worsening dyspnea and increased need of oxygen which he said started this morning.  At baseline he is at 8 L at rest and 10 L with ambulation.  Earlier this morning he started having increased shortness of breath with his O2 sats in the 50s-60s.  He was unable to recover his home oxygen machine.  He reports having a cough with some phlem but no chest pain or fever. No known sick contact  Historically he has been on oxygen for 4 years but has recently become incapacitated where he has been unable to leave the house for 6 months.  He follows with pulmonology outpatient and during his pulmonology PT session they recommended his oxygen to be increased to 10 L.   In the ED, O2 sat in the 70s.  He quickly recovered on 8 L to above 95%.  CBC significant for leukocytosis 17.6.  Creatinine 1.  4 3.  BNP 125.  Chest x-ray showed chronic lung disease.  Viral testing negative.  Troponin normal.  EKG showing sinus tachycardia.  Lactic acid 1.3.  CTA to rule out PE was negative but did demonstrate pneumonia.  Azithromycin and ceftriaxone given and 1 L bolus administered.  Review Of Systems: Per HPI with the following additions: As above  Pertinent Past Medical History: Basal cell carcinoma Cardiomegaly Chronic diastolic heart failure Chronic respiratory failure, pan lobar emphysema, ex cigarette smoker Hyperlipidemia GERD  Pulmonary hypertension History of TIA Memory loss, mild cognitive disorder Seizure BPH Remainder reviewed in history tab.   Pertinent Past Surgical History: Cholecystectomy Remainder reviewed in history tab.   Pertinent Social History: Tobacco use: Former, 2 packs a day for 82yr and quit in 1992 Alcohol use: couple of beer a day, never admitted for withdrawal Other Substance use: None Lives with Wife  Pertinent  Family History: Mother: Obesity, memory loss Remainder reviewed in history tab.   Important Outpatient Medications: Anoro Ellipta 62.5-25, azithromycin 2 and 50 mg, Atrovent nasal spray, DuoNeb twice daily, home oxygen ASA, Lipitor Entresto,  Lasix 20 mg 3 times a week Keppra 250 mg a.m., 500 mg p.m. Prilosec 40 mg Aricept 10 mg Finasteride 5 mg QS Remainder reviewed in medication history.   Objective: BP 118/78   Pulse 84   Temp 98.7 F (37.1 C)   Resp 19   SpO2 100%  Exam: Chronically ill-appearing, no acute distress Cardio: Regular rate, regular rhythm, no murmurs on exam. Pulm: No increased work of breathing.  Prolonged expiratory phase with coarse breath sounds.  Diminished at lung bases Abdominal: bowel sounds present, soft, non-tender, non-distended Extremities: no peripheral edema  Neuro: alert and oriented x3, speech normal in content, no facial asymmetry   Labs:  CBC BMET  Recent Labs  Lab 10/27/22 1040 10/27/22 1121  WBC 17.6*  --   HGB 13.3 14.3  HCT 42.1 42.0  PLT 311  --    Recent Labs  Lab 10/27/22 1040 10/27/22 1121  NA 139 139  K 4.1 4.1  CL 98  --   CO2 27  --   BUN 17  --   CREATININE 1.43*  --   GLUCOSE 106*  --   CALCIUM 9.1  --     Pertinent additional labs pO2 28, pCO2 61.4, pH 7.3, troponin 6, lactic acid 1.3.   EKG: Sinus tachycardia, no ST elevations, QTc interval WNL   Imaging Studies Performed:  CXR: Chronic lung changes, overinflated  CTA: No evidence of pulmonary embolism.  Severe pulmonary emphysema. Increased diffuse ground-glass and new consolidative airspace disease in the lung bases and peripheral posterior aspect of the right upper lobe, concerning for pneumonia. Trace pleural effusions and trace pericardial effusion. Recommend follow-up chest CT in 3 months.   Unchanged right apical pleuroparenchymal scarring with waxing and waning nodular features dating back to at least June 2020.   Multiple prominent  mediastinal lymph nodes, favored to be reactive. Recommend attention on follow-up.  MDarci Current DO 10/27/2022, 4:11 PM PGY-1, [facility] pager: [phone] text pages welcome Secure chat group [place] I was personally present and re-performed the exam. I verified the service and findings are accurately documented in the intern's note. Additionally patient presented with mild AKI with creatinine of 1.43, suspect this is most likely prerenal.  He has received bolus IV fluid we will continue to monitor closely.  JAlen Bleacher MD 10/27/2022 4:59 PM

## 2022-10-28 ENCOUNTER — Encounter (HOSPITAL_COMMUNITY): Payer: Self-pay | Admitting: Family Medicine

## 2022-10-28 DIAGNOSIS — D649 Anemia, unspecified: Secondary | ICD-10-CM | POA: Diagnosis present

## 2022-10-28 DIAGNOSIS — R569 Unspecified convulsions: Secondary | ICD-10-CM | POA: Diagnosis present

## 2022-10-28 DIAGNOSIS — J9601 Acute respiratory failure with hypoxia: Secondary | ICD-10-CM

## 2022-10-28 DIAGNOSIS — J9621 Acute and chronic respiratory failure with hypoxia: Secondary | ICD-10-CM | POA: Diagnosis present

## 2022-10-28 DIAGNOSIS — R000 Tachycardia, unspecified: Secondary | ICD-10-CM | POA: Diagnosis present

## 2022-10-28 DIAGNOSIS — J441 Chronic obstructive pulmonary disease with (acute) exacerbation: Secondary | ICD-10-CM

## 2022-10-28 DIAGNOSIS — J159 Unspecified bacterial pneumonia: Secondary | ICD-10-CM | POA: Diagnosis present

## 2022-10-28 DIAGNOSIS — Z515 Encounter for palliative care: Secondary | ICD-10-CM | POA: Diagnosis not present

## 2022-10-28 DIAGNOSIS — Z66 Do not resuscitate: Secondary | ICD-10-CM | POA: Diagnosis present

## 2022-10-28 DIAGNOSIS — N401 Enlarged prostate with lower urinary tract symptoms: Secondary | ICD-10-CM | POA: Diagnosis present

## 2022-10-28 DIAGNOSIS — K219 Gastro-esophageal reflux disease without esophagitis: Secondary | ICD-10-CM | POA: Diagnosis present

## 2022-10-28 DIAGNOSIS — I5032 Chronic diastolic (congestive) heart failure: Secondary | ICD-10-CM | POA: Diagnosis present

## 2022-10-28 DIAGNOSIS — Z7189 Other specified counseling: Secondary | ICD-10-CM | POA: Diagnosis not present

## 2022-10-28 DIAGNOSIS — I2510 Atherosclerotic heart disease of native coronary artery without angina pectoris: Secondary | ICD-10-CM | POA: Diagnosis present

## 2022-10-28 DIAGNOSIS — J440 Chronic obstructive pulmonary disease with acute lower respiratory infection: Secondary | ICD-10-CM | POA: Diagnosis present

## 2022-10-28 DIAGNOSIS — Z79899 Other long term (current) drug therapy: Secondary | ICD-10-CM | POA: Diagnosis not present

## 2022-10-28 DIAGNOSIS — Z789 Other specified health status: Secondary | ICD-10-CM

## 2022-10-28 DIAGNOSIS — I272 Pulmonary hypertension, unspecified: Secondary | ICD-10-CM | POA: Diagnosis present

## 2022-10-28 DIAGNOSIS — Z9981 Dependence on supplemental oxygen: Secondary | ICD-10-CM | POA: Diagnosis not present

## 2022-10-28 DIAGNOSIS — G4700 Insomnia, unspecified: Secondary | ICD-10-CM | POA: Diagnosis present

## 2022-10-28 DIAGNOSIS — E785 Hyperlipidemia, unspecified: Secondary | ICD-10-CM | POA: Diagnosis present

## 2022-10-28 DIAGNOSIS — J431 Panlobular emphysema: Secondary | ICD-10-CM | POA: Diagnosis present

## 2022-10-28 DIAGNOSIS — J189 Pneumonia, unspecified organism: Secondary | ICD-10-CM | POA: Diagnosis not present

## 2022-10-28 DIAGNOSIS — Z1152 Encounter for screening for COVID-19: Secondary | ICD-10-CM | POA: Diagnosis not present

## 2022-10-28 DIAGNOSIS — G3184 Mild cognitive impairment, so stated: Secondary | ICD-10-CM | POA: Insufficient documentation

## 2022-10-28 DIAGNOSIS — J449 Chronic obstructive pulmonary disease, unspecified: Secondary | ICD-10-CM | POA: Insufficient documentation

## 2022-10-28 DIAGNOSIS — N179 Acute kidney failure, unspecified: Secondary | ICD-10-CM | POA: Diagnosis present

## 2022-10-28 DIAGNOSIS — E861 Hypovolemia: Secondary | ICD-10-CM | POA: Diagnosis present

## 2022-10-28 DIAGNOSIS — J9622 Acute and chronic respiratory failure with hypercapnia: Secondary | ICD-10-CM | POA: Diagnosis present

## 2022-10-28 LAB — BASIC METABOLIC PANEL
Anion gap: 9 (ref 5–15)
BUN: 18 mg/dL (ref 8–23)
CO2: 25 mmol/L (ref 22–32)
Calcium: 8.8 mg/dL — ABNORMAL LOW (ref 8.9–10.3)
Chloride: 103 mmol/L (ref 98–111)
Creatinine, Ser: 1.06 mg/dL (ref 0.61–1.24)
GFR, Estimated: >60 mL/min (ref >60)
Glucose, Bld: 150 mg/dL — ABNORMAL HIGH (ref 70–99)
Potassium: 4.4 mmol/L (ref 3.5–5.1)
Sodium: 137 mmol/L (ref 135–145)

## 2022-10-28 MED ORDER — PREDNISONE 20 MG PO TABS
40.0000 mg | ORAL_TABLET | Freq: Every day | ORAL | Status: DC
  Administered 2022-10-29 – 2022-10-30 (×2): 40 mg via ORAL
  Filled 2022-10-28 (×2): qty 2

## 2022-10-28 MED ORDER — FUROSEMIDE 40 MG PO TABS
40.0000 mg | ORAL_TABLET | ORAL | Status: DC
  Administered 2022-10-29: 40 mg via ORAL
  Filled 2022-10-28 (×2): qty 1

## 2022-10-28 MED ORDER — FUROSEMIDE 20 MG PO TABS: 40.0000 mg | ORAL_TABLET | Freq: Every day | ORAL | Status: DC

## 2022-10-28 NOTE — ED Notes (Signed)
ED TO INPATIENT HANDOFF REPORT  ED Nurse Name and Phone #: cat [phone]  S Name/Age/Gender Frank Meyer 82 y.o. male Room/Bed: 010C/010C  Code Status   Code Status: DNR  Home/SNF/Other Home Patient oriented to: self, place, time, and situation Is this baseline? Yes   Triage Complete: Triage complete  Chief Complaint Acute hypoxic respiratory failure ([place]) [J96.01]  Triage Note Pt BIBPOV for SOB, intermittently wears oxygen at home, was on 10 LPM, and at home when his oxygen levels were in the 40s-50s. He was trying to get dressed for Pulmonary rehab, when he got very short of breath and could not slow his breathing down. In triage oxygen was 70% on 6LPM. Patient appeared cyanotic. Pt speaking in broken sentences, tachypnic, and unable to "catch my breath" per pt.   Hx pulmonary htn   Allergies No Known Allergies  Level of Care/Admitting Diagnosis ED Disposition     ED Disposition  Admit   Condition  --   Comment  Hospital Area: [facility] [[id number]]  Level of Care: Progressive [102]  Admit to Progressive based on following criteria: RESPIRATORY PROBLEMS hypoxemic/hypercapnic respiratory failure that is responsive to NIPPV (BiPAP) or High Flow Nasal Cannula (6-80 lpm). Frequent assessment/intervention, no > Q2 hrs < Q4 hrs, to maintain oxygenation and pulmonary hygiene.  May admit patient to [facility] or [facility] if equivalent level of care is available:: Yes  Covid Evaluation: Confirmed COVID Negative  Diagnosis: Acute hypoxic respiratory failure [facility]) [id number]  Admitting Physician: NEAL, [place]  Attending Physician: NEAL, SARA L [id number]  Certification:: I certify this patient will need inpatient services for at least 2 midnights          B Medical/Surgery History Past Medical History:  Diagnosis Date   AKI (acute kidney injury) ([place]) 08/03/2022   Allergic rhinitis 10/24/2009   Altered bowel habits 10/04/2020   Basal cell  carcinoma    Benign prostatic hyperplasia with lower urinary tract symptoms 10/24/2009   Benign prostatic hypertrophy    Bradycardia 12/06/2019   Callosity 10/16/2013   Cardiomegaly 01/23/2015   Chronic diastolic heart failure ([place]) 12/19/2018   Chronic respiratory failure ([place]) 10/04/2020   Coronary artery calcification seen on CAT scan 05/28/2020   Dyslipidemia    Dyspnea on exertion 03/18/2018   Elevated troponin 08/03/2022   Emphysema lung ([place]) 03/18/2018   Encounter for general adult medical examination without abnormal findings 01/17/2015   Epistaxis 09/18/2016   Ex-cigarette smoker 01/20/2021   Exudative age-related macular degeneration of left eye with active choroidal neovascularization ([place]) 12/18/2019   Gallbladder problem    Gastroesophageal reflux disease    Hiatal hernia    Hilar adenopathy 03/25/2018   History of seizures 08/03/2022   History of smoking 03/18/2018   History of transient ischemic attack (TIA) 10/24/2009   Hyperlipidemia 10/24/2009   Insomnia 05/09/2013   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   Internal hemorrhoid 10/04/2020   Macular degeneration    Macular pucker, right eye 04/30/2020   Mediastinal adenopathy 03/25/2018   Memory loss 05/09/2013   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   Mild memory disturbance    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   Noninfective gastroenteritis and colitis, unspecified 10/04/2020   Nuclear sclerotic cataract of right eye 12/18/2019   Other specified symptoms and signs involving the digestive system and abdomen 10/04/2020   Overweight 09/14/2018   Oxygen dependent 10/04/2020   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   Personal history of  colonic polyps 10/04/2020   Posterior vitreous detachment of right eye 12/18/2019   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   Pulmonary hypertension ([place])    Right upper lobe pulmonary nodule  03/25/2018   Syncope and collapse 12/12/2018   TIA (transient ischemic attack)    Transient ischemic attack 10/24/2009   Vasomotor rhinitis 09/18/2016   Weight decreased 10/04/2020   Past Surgical History:  Procedure Laterality Date   CHOLECYSTECTOMY     none       A IV Location/Drains/Wounds Patient Lines/Drains/Airways Status     Active Line/Drains/Airways     Name Placement date Placement time Site Days   Peripheral IV 10/27/22 18 G Left Antecubital 10/27/22  1046  Antecubital  1            Intake/Output Last 24 hours  Intake/Output Summary (Last 24 hours) at 10/28/2022 1455 Last data filed at 10/28/2022 0746 Gross per 24 hour  Intake 1070.97 ml  Output 1775 ml  Net -704.03 ml    Labs/Imaging Results for orders placed or performed during the hospital encounter of 10/27/22 (from the past 48 hour(s))  CBC with Differential     Status: Abnormal   Collection Time: 10/27/22 10:40 AM  Result Value Ref Range   WBC 17.6 (H) 4.0 - 10.5 K/uL   RBC 4.86 4.22 - 5.81 MIL/uL   Hemoglobin 13.3 13.0 - 17.0 g/dL   HCT 42.1 39.0 - 52.0 %   MCV 86.6 80.0 - 100.0 fL   MCH 27.4 26.0 - 34.0 pg   MCHC 31.6 30.0 - 36.0 g/dL   RDW 14.8 11.5 - 15.5 %   Platelets 311 150 - 400 K/uL   nRBC 0.0 0.0 - 0.2 %   Neutrophils Relative % 67 %   Neutro Abs 11.7 (H) 1.7 - 7.7 K/uL   Lymphocytes Relative 21 %   Lymphs Abs 3.7 0.7 - 4.0 K/uL   Monocytes Relative 6 %   Monocytes Absolute 1.1 (H) 0.1 - 1.0 K/uL   Eosinophils Relative 4 %   Eosinophils Absolute 0.8 (H) 0.0 - 0.5 K/uL   Basophils Relative 1 %   Basophils Absolute 0.2 (H) 0.0 - 0.1 K/uL   Immature Granulocytes 1 %   Abs Immature Granulocytes 0.10 (H) 0.00 - 0.07 K/uL    Comment: Performed at [facility], [address] [place], [place] [id number]  Basic metabolic panel     Status: Abnormal   Collection Time: 10/27/22 10:40 AM  Result Value Ref Range   Sodium 139 135 - 145 mmol/L   Potassium 4.1 3.5 - 5.1 mmol/L    Chloride 98 98 - 111 mmol/L   CO2 27 22 - 32 mmol/L   Glucose, Bld 106 (H) 70 - 99 mg/dL    Comment: Glucose reference range applies only to samples taken after fasting for at least 8 hours.   BUN 17 8 - 23 mg/dL   Creatinine, Ser 1.43 (H) 0.61 - 1.24 mg/dL   Calcium 9.1 8.9 - 10.3 mg/dL   GFR, Estimated 49 (L) >60 mL/min    Comment: (NOTE) Calculated using the CKD-EPI Creatinine Equation (2021)    Anion gap 14 5 - 15    Comment: Performed at [facility] [address], [place], [place] [address]  Brain natriuretic peptide     Status: Abnormal   Collection Time: 10/27/22 10:40 AM  Result Value Ref Range   B Natriuretic Peptide 125.4 (H) 0.0 -  100.0 pg/mL    Comment: Performed at [facility], [place] [address], [place], [place] [address]  Troponin I (High Sensitivity)     Status: None   Collection Time: 10/27/22 10:40 AM  Result Value Ref Range   Troponin I (High Sensitivity) 6 <18 ng/L    Comment: (NOTE) Elevated high sensitivity troponin I (hsTnI) values and significant  changes across serial measurements may suggest ACS but many other  chronic and acute conditions are known to elevate hsTnI results.  Refer to the "Links" section for chest pain algorithms and additional  guidance. Performed at [facility], [place] [address], [place], [place] [address]   Lactic acid, plasma     Status: Abnormal   Collection Time: 10/27/22 10:40 AM  Result Value Ref Range   Lactic Acid, Venous 2.2 (HH) 0.5 - 1.9 mmol/L    Comment: CRITICAL RESULT CALLED TO, READ BACK BY AND VERIFIED WITH ELEXES ANELLO RN.'@[id number]'$  ON 3.12.24 BY TCALDWELL MT. Performed at [facility], [place] [address], [place], [place] [address]   Resp panel by RT-PCR (RSV, Flu A&B, Covid) Anterior Nasal Swab     Status: None   Collection Time: 10/27/22 10:59 AM   Specimen: Anterior Nasal Swab  Result Value Ref Range   SARS Coronavirus 2 by RT PCR NEGATIVE NEGATIVE   Influenza A by PCR NEGATIVE NEGATIVE    Influenza B by PCR NEGATIVE NEGATIVE    Comment: (NOTE) The Xpert Xpress SARS-CoV-2/FLU/RSV plus assay is intended as an aid in the diagnosis of influenza from Nasopharyngeal swab specimens and should not be used as a sole basis for treatment. Nasal washings and aspirates are unacceptable for Xpert Xpress SARS-CoV-2/FLU/RSV testing.  Fact Sheet for Patients: EntrepreneurPulse.com.au  Fact Sheet for Healthcare Providers: IncredibleEmployment.be  This test is not yet approved or cleared by the [place] FDA and has been authorized for detection and/or diagnosis of SARS-CoV-2 by FDA under an Emergency Use Authorization (EUA). This EUA will remain in effect (meaning this test can be used) for the duration of the COVID-19 declaration under Section 564(b)(1) of the Act, 21 U.S.C. section 360bbb-3(b)(1), unless the authorization is terminated or revoked.     Resp Syncytial Virus by PCR NEGATIVE NEGATIVE    Comment: (NOTE) Fact Sheet for Patients: EntrepreneurPulse.com.au  Fact Sheet for Healthcare Providers: IncredibleEmployment.be  This test is not yet approved or cleared by the [place] FDA and has been authorized for detection and/or diagnosis of SARS-CoV-2 by FDA under an Emergency Use Authorization (EUA). This EUA will remain in effect (meaning this test can be used) for the duration of the COVID-19 declaration under Section 564(b)(1) of the Act, 21 U.S.C. section 360bbb-3(b)(1), unless the authorization is terminated or revoked.  Performed at [facility], [place] [address] [place], [place] [address]   Blood culture (routine x 2)     Status: None (Preliminary result)   Collection Time: 10/27/22 11:20 AM   Specimen: BLOOD RIGHT ARM  Result Value Ref Range   Specimen Description BLOOD RIGHT ARM    Special Requests      BOTTLES DRAWN AEROBIC AND ANAEROBIC Blood Culture results may not be  optimal due to an inadequate volume of blood received in culture bottles   Culture      NO GROWTH < 24 HOURS Performed at [facility], [place] [address] [place], [place] [address]    Report Status PENDING   Blood culture (routine x 2)  Status: None (Preliminary result)   Collection Time: 10/27/22 11:20 AM   Specimen: BLOOD  Result Value Ref Range   Specimen Description BLOOD LEFT ANTECUBITAL    Special Requests      BOTTLES DRAWN AEROBIC AND ANAEROBIC Blood Culture adequate volume   Culture      NO GROWTH < 24 HOURS Performed at [facility], [place] [address] [place], [place] [address]    Report Status PENDING   I-Stat venous blood gas, ([facility], [facility], [facility])     Status: Abnormal   Collection Time: 10/27/22 11:21 AM  Result Value Ref Range   pH, Ven 7.309 7.25 - 7.43   pCO2, Ven 61.4 (H) 44 - 60 mmHg   pO2, Ven 28 (LL) 32 - 45 mmHg   Bicarbonate 30.8 (H) 20.0 - 28.0 mmol/L   TCO2 33 (H) 22 - 32 mmol/L   O2 Saturation 45 %   Acid-Base Excess 3.0 (H) 0.0 - 2.0 mmol/L   Sodium 139 135 - 145 mmol/L   Potassium 4.1 3.5 - 5.1 mmol/L   Calcium, Ion 1.24 1.15 - 1.40 mmol/L   HCT 42.0 39.0 - 52.0 %   Hemoglobin 14.3 13.0 - 17.0 g/dL   Sample type VENOUS    Comment NOTIFIED PHYSICIAN   Troponin I (High Sensitivity)     Status: None   Collection Time: 10/27/22 12:55 PM  Result Value Ref Range   Troponin I (High Sensitivity) 6 <18 ng/L    Comment: (NOTE) Elevated high sensitivity troponin I (hsTnI) values and significant  changes across serial measurements may suggest ACS but many other  chronic and acute conditions are known to elevate hsTnI results.  Refer to the "Links" section for chest pain algorithms and additional  guidance. Performed at [facility], [place] [address] [place], [place] [address]   Lactic acid, plasma     Status: None   Collection Time: 10/27/22  1:01 PM  Result Value Ref Range   Lactic Acid, Venous 1.3 0.5 - 1.9 mmol/L    Comment: Performed  at [facility] [address], [place], [place] [id number]  Basic metabolic panel     Status: Abnormal   Collection Time: 10/28/22  4:28 AM  Result Value Ref Range   Sodium 137 135 - 145 mmol/L   Potassium 4.4 3.5 - 5.1 mmol/L   Chloride 103 98 - 111 mmol/L   CO2 25 22 - 32 mmol/L   Glucose, Bld 150 (H) 70 - 99 mg/dL    Comment: Glucose reference range applies only to samples taken after fasting for at least 8 hours.   BUN 18 8 - 23 mg/dL   Creatinine, Ser 1.06 0.61 - 1.24 mg/dL   Calcium 8.8 (L) 8.9 - 10.3 mg/dL   GFR, Estimated >60 >60 mL/min    Comment: (NOTE) Calculated using the CKD-EPI Creatinine Equation (2021)    Anion gap 9 5 - 15    Comment: Performed at [facility] [address] [place], [place] [address]   CT Angio Chest PE W and/or Wo Contrast  Result Date: 10/27/2022 CLINICAL DATA:  Pulmonary embolism (PE) suspected, high prob EXAM: CT ANGIOGRAPHY CHEST WITH CONTRAST TECHNIQUE: Multidetector CT imaging of the chest was performed using the standard protocol during bolus administration of intravenous contrast. Multiplanar CT image reconstructions and MIPs were obtained to evaluate the vascular anatomy. RADIATION DOSE REDUCTION: This exam was performed according to the departmental dose-optimization program which includes automated  exposure control, adjustment of the mA and/or kV according to patient size and/or use of iterative reconstruction technique. CONTRAST:  46m OMNIPAQUE IOHEXOL 350 MG/ML SOLN COMPARISON:  Chest CT 09/23/2022, CT 01/24/2019. FINDINGS: Cardiovascular: Satisfactory opacification of the pulmonary arteries to the segmental level. No evidence of pulmonary embolism. Unchanged cardiomegaly.Trace pericardial effusion. Coronary artery atherosclerosis. Mild atherosclerosis of the thoracic aorta. Mediastinum/Nodes: There are multiple prominent mediastinal lymph nodes, for reference a right paratracheal lymph node measuring 1.1 cm (series 5, image 43),  and a precarinal lymph node measuring up to 1.2 cm (series 5, image 57). This previously measured 0.8 and 0.9 cm respectively. The thyroid is unremarkable. Lungs/Pleura: Severe centrilobular and paraseptal emphysema. There is increased diffuse ground-glass and new consolidative airspace disease in the lung bases and peripheral posterior aspect of the right upper lobe. Similar 1.2 x 1.2 cm nodular opacity in the right apex with architectural distortion. Adjacent apical consolidation and bronchiectasis, similar to prior exams dating back to at least June 2020. Right apical nodular areas have been waxing waning over multiple prior exams over this time interval. Trace pleural effusions. No evidence of pneumothorax. Upper Abdomen: Postsurgical changes in the upper abdomen. No acute findings. Musculoskeletal: No acute osseous abnormality. No suspicious osseous lesion. T2 vertebral body hemangioma. Benign bone island in T3. No aggressive osseous lesion. Review of the MIP images confirms the above findings. IMPRESSION: No evidence of pulmonary embolism. Severe pulmonary emphysema. Increased diffuse ground-glass and new consolidative airspace disease in the lung bases and peripheral posterior aspect of the right upper lobe, concerning for pneumonia. Trace pleural effusions and trace pericardial effusion. Recommend follow-up chest CT in 3 months. Unchanged right apical pleuroparenchymal scarring with waxing and waning nodular features dating back to at least June 2020. Multiple prominent mediastinal lymph nodes, favored to be reactive. Recommend attention on follow-up. Electronically Signed   By: JMaurine SimmeringM.D.   On: 10/27/2022 13:37   DG Chest Portable 1 View  Result Date: 10/27/2022 CLINICAL DATA:  Shortness of breath. EXAM: PORTABLE CHEST 1 VIEW COMPARISON:  09/15/2022 FINDINGS: Advanced changes of emphysema. Architectural distortion/scarring in the right upper and lower lung as well as left base is similar to prior.  No evidence for pulmonary edema or pleural effusion. Cardiopericardial silhouette is at upper limits of normal for size. The visualized bony structures of the thorax are unremarkable. Telemetry leads overlie the chest. IMPRESSION: Chronic architectural distortion/scarring in both lungs, as before. No new or acute cardiopulmonary findings. Electronically Signed   By: EMisty StanleyM.D.   On: 10/27/2022 11:25    Pending Labs Unresulted Labs (From admission, onward)     Start     Ordered   11/03/22 0500  Creatinine, serum  (enoxaparin (LOVENOX)    CrCl >/= 30 ml/min)  Weekly,   R     Comments: while on enoxaparin therapy    10/27/22 1532   10/29/22 [id number] Basic metabolic panel  Tomorrow morning,   R        10/28/22 1037   10/29/22 0500  CBC  Tomorrow morning,   R        10/28/22 1037   10/28/22 0911  MRSA Next Gen by PCR, Nasal  Once,   R        10/28/22 0910            Vitals/Pain Today's Vitals   10/28/22 0821 10/28/22 0830 10/28/22 0846 10/28/22 0900  BP:  103/83  115/75  Pulse:  77  90  Resp:  (!) 22  (!) 23  Temp:      TempSrc:      SpO2: 100% 98%  100%  Weight:   168 lb (76.2 kg)   Height:   '5\' 6"'$  (1.676 m)   PainSc:        Isolation Precautions Airborne and Contact precautions  Medications Medications  aspirin EC tablet 81 mg (81 mg Oral Given 10/28/22 0942)  atorvastatin (LIPITOR) tablet 20 mg (20 mg Oral Given 10/27/22 1644)  donepezil (ARICEPT) tablet 10 mg (10 mg Oral Given 10/28/22 0942)  traZODone (DESYREL) tablet 200 mg (200 mg Oral Given 10/27/22 2356)  finasteride (PROSCAR) tablet 5 mg (5 mg Oral Given 10/27/22 2355)  umeclidinium-vilanterol (ANORO ELLIPTA) 62.5-25 MCG/ACT 1 puff (1 puff Inhalation Given 10/28/22 0813)  loratadine (CLARITIN) tablet 10 mg (10 mg Oral Given 10/28/22 0942)  ipratropium (ATROVENT) 0.06 % nasal spray 1-2 spray ( Each Nare Not Given 10/28/22 1023)  ipratropium-albuterol (DUONEB) 0.5-2.5 (3) MG/3ML nebulizer solution 3 mL (3 mLs  Nebulization Given 10/28/22 0823)  enoxaparin (LOVENOX) injection 40 mg (40 mg Subcutaneous Given 10/28/22 0943)  acetaminophen (TYLENOL) tablet 650 mg (has no administration in time range)    Or  acetaminophen (TYLENOL) suppository 650 mg (has no administration in time range)  polyethylene glycol (MIRALAX / GLYCOLAX) packet 17 g (has no administration in time range)  ondansetron (ZOFRAN) tablet 4 mg (has no administration in time range)    Or  ondansetron (ZOFRAN) injection 4 mg (has no administration in time range)  melatonin tablet 20 mg (20 mg Oral Given 10/28/22 0009)  levETIRAcetam (KEPPRA) tablet 250 mg (250 mg Oral Given 10/28/22 0942)    And  levETIRAcetam (KEPPRA) tablet 500 mg (500 mg Oral Given 10/27/22 1644)  azithromycin (ZITHROMAX) 500 mg in sodium chloride 0.9 % 250 mL IVPB (0 mg Intravenous Stopped 10/28/22 1455)  cefTRIAXone (ROCEPHIN) 1 g in sodium chloride 0.9 % 100 mL IVPB (0 g Intravenous Stopped 10/28/22 1019)  furosemide (LASIX) tablet 40 mg (has no administration in time range)  predniSONE (DELTASONE) tablet 40 mg (has no administration in time range)  methylPREDNISolone sodium succinate (SOLU-MEDROL) 125 mg/2 mL injection 125 mg (125 mg Intravenous Given 10/27/22 1129)  ipratropium-albuterol (DUONEB) 0.5-2.5 (3) MG/3ML nebulizer solution 3 mL (3 mLs Nebulization Given 10/27/22 1114)  cefTRIAXone (ROCEPHIN) 1 g in sodium chloride 0.9 % 100 mL IVPB (0 g Intravenous Stopped 10/27/22 1619)  azithromycin (ZITHROMAX) 500 mg in sodium chloride 0.9 % 250 mL IVPB (0 mg Intravenous Stopped 10/27/22 1508)  lactated ringers bolus 1,000 mL (0 mLs Intravenous Stopped 10/27/22 1508)  iohexol (OMNIPAQUE) 350 MG/ML injection 75 mL (75 mLs Intravenous Contrast Given 10/27/22 1308)  lactated ringers bolus 1,000 mL (0 mLs Intravenous Stopped 10/27/22 2126)    Mobility walks with person assist     Focused Assessments Pulmonary Assessment Handoff:  Lung sounds: Bilateral Breath Sounds:  Diminished L Breath Sounds: Diminished R Breath Sounds: Diminished O2 Device: Nasal Cannula (salter) O2 Flow Rate (L/min): 6 L/min    R Recommendations: See Admitting Provider Note  Report given to:   Additional Notes:

## 2022-10-28 NOTE — Consult Note (Addendum)
Consultation Note Date: 10/28/2022   Patient Name: Frank Meyer  DOB: 05/08/1941  MRN: [id number]  Age / Sex: 82 y.o., male  PCP: Burnard Bunting, MD Referring Physician: Dickie La, MD  Reason for Consultation: Establishing goals of care, "goals of care, already DNR but may benefit from palliative outpatient. Possibly considering comfort care but still wanting life prolonging measures. "  HPI/Patient Profile: 82 y.o. male  with past medical history of COPD with chronic hypoxic respiratory failure, pulmonary hypertension, chronic diastolic CHF, memory loss, and history of seizures presented to ED on 10/27/2022 from home with complaints of shortness of breath and oxygen saturations in the 40s to 50s.  Patient was admitted on 10/27/2022 with acute hypoxic respiratory failure secondary to acute community-acquired bilateral pneumonia in setting of longstanding COPD/emphysema.   Of note, patient has had 3 admissions in 6 months.  Clinical Assessment and Goals of Care: I have reviewed medical records including EPIC notes, labs, and imaging. Received report from primary RN -no acute concerns.  Went to visit patient at bedside - no family/visitors present. Patient was lying in bed awake, alert, oriented, and able to participate in conversation. No signs or non-verbal gestures of pain or discomfort noted. No respiratory distress, increased work of breathing, or secretions noted.  He is on 6 L O2 nasal cannula with saturations 100% on bedside monitor.  He is eating breakfast.  Met with patient to discuss diagnosis, prognosis, GOC, EOL wishes, disposition, and options.  I introduced Palliative Medicine as specialized medical care for people living with serious illness. It focuses on providing relief from the symptoms and stress of a serious illness. The goal is to improve quality of life for both the patient and the  family.  We discussed a brief life review of the patient as well as functional and nutritional status.  Patient is married and has 2 daughters (one lives 5 miles away and the other lives in [place]). Prior to hospitalization, patient was living in a private residence with his wife and dog.  Therapeutic listening provided as patient reflects over how his quality of life and respiratory status has declined over the past year.  Patient tells me that up until last year he was mowing, golfing, doing things outside he enjoys.  As of December 2023 he was able to go outside and walk his dog; however, now he can "barely walk to the coffee pot."  His oxygen prescription is for 5 L; but he usually wears 8-10 liters with activity.  Patient's portable oxygen only goes to 6 L which makes this difficult for him to travel and go to appointments.  He can walk to the car with his portable oxygen, but "I drop" when he gets to the car -he has to sit in the car until he can catch his breath before he drives.  He is open to speaking with TOC to see if there are any other resources regarding portable oxygen to meet his needs.  Patient does still participate in pulmonary rehab.  Patient has been on oxygen "a couple of years" and he indicates that "every time I come here (hospital) I go home less than I was before."  Albumin noted at 2.8 on 09/10/2022.  We discussed patient's current illness and what it means in the larger context of patient's on-going co-morbidities. Education provided that COPD and CHF are progressive, non-curable disease underlying the patient's current acute medical conditions.  Patient has a clear understanding of his current acute medical  situation. Natural disease trajectory and expectations at EOL were discussed. I attempted to elicit values and goals of care important to the patient. The difference between aggressive medical intervention and comfort care was considered in light of the patient's goals of care.   Patient tells me "I am going to die soon."  He indicates it is important for him to focus on his quality of life so he can continue to enjoy his family and dog for as long as he can.  Provided education and counseling at length on the philosophy and benefits of hospice care. Discussed that it offers a holistic approach to care in the setting of end-stage illness, and is about supporting the patient where they are allowing nature to take it's course. Discussed the hospice team includes RNs, physicians, social workers, and chaplains. They can provide personal care, support for the family, and help keep patient out of the hospital as well as assist with DME needs for home hospice.  Patient is surprised to hear that he would likely qualify for hospice services, but is open to discuss this option and how the can support his goals.  He would like to continue this conversation when his wife is present, which is understandable.   Advance directives, concepts specific to code status, and rehospitalization were considered and discussed.  Patient understands that he is at high risk for rehospitalization due to his progressive chronic illnesses.  His wife is his healthcare power of attorney.  DNR/DNI confirmed.  Patient wonders if "medications are less effective over time."  He tells me that when he used to receive breathing treatments, he would "bounce back very quickly;" however, now he is not able to bounce back as quickly.  Education provided that it is not his medications that are less effective, rather his body is not as readily responsive to treatments due to progressive, now advanced, disease.  Goal at this time is to continue current medical interventions to treat the treatable.  He is agreeable for me to call his wife to schedule meeting for continued goals of care around discharge options for hospice versus outpatient palliative care.  Discussed with patient the importance of continued conversation with  each other and the medical providers regarding overall plan of care and treatment options, ensuring decisions are within the context of the patient's values and GOCs.    Questions and concerns were addressed. The patient was encouraged to call with questions and/or concerns. PMT card was provided.  9:34 AM Attempted to call wife/[name] to schedule meeting - no answer - confidential voicemail left and PMT phone number provided with request to return call.  Primary Decision Maker: PATIENT    SUMMARY OF RECOMMENDATIONS   Continue to treat the treatable DNR/DNI confirmed - durable DNR form completed and placed with patient's belongings at bedside in ED. Copy was made and will be scanned into Vynca/ACP tab Attempting to schedule meeting to include patient's wife for ongoing [place] around discharge options for hospice versus outpatient palliative care - VM left for wife to call PMT [facility] consulted for: patient's current portable oxygen machine does not meet his needs while ambulating PMT will continue to follow and support holistically  **ADDENDUM** 3:10 PM Notified wife/[name] returned call.  3:25 PM Called Olin Hauser - emotional support provided. She would like to schedule meeting for tomorrow at 2p.   Per her request, reviewed patient's current clinical status and interval history since admissions. Provided brief review of discussion had with patient earlier today. Discussed his  progressive decline over the last year. Discussed it would be important to discuss with patient his goals on rehospitalizations. If he would not want rehospitalizations, to remain comfortable at home focusing on quality of life, allowing nature to take it's course - hospice would be an appropriate option. If patient would want readmissions, outpatient Palliative Care would likely be the best support. She is surprised to hear patient may qualify for hospice care.   Discussion on the difference between Palliative and Hospice  care per her request. Palliative care and hospice have similar goals of managing symptoms, promoting comfort, improving quality of life, and maintaining a person's dignity. However, palliative care may be offered during any phase of a serious illness, while hospice care is usually offered when a person is expected to live for 6 months or less.   She thinks he will likely choose outpatient Palliative Care, but will continue discussions with patient.   She expresses appreciation for phone call and information today.  +16mnutes  Code Status/Advance Care Planning: DNR  Palliative Prophylaxis:  Aspiration, Frequent Pain Assessment, Oral Care, and Turn Reposition  Additional Recommendations (Limitations, Scope, Preferences): Full Scope Treatment and No Tracheostomy  Psycho-social/Spiritual:  Desire for further Chaplaincy support:no Created space and opportunity for patient to express thoughts and feelings regarding patient's current medical situation.  Emotional support and therapeutic listening provided.  Prognosis:  Unable to determine  Discharge Planning: To Be Determined      Primary Diagnoses: Present on Admission:  Acute hypoxic respiratory failure (HCC)  Chronic diastolic CHF (congestive heart failure) (HCC)  COPD exacerbation ([facility]   I have reviewed the medical record, interviewed the patient and family, and examined the patient. The following aspects are pertinent.  Past Medical History:  Diagnosis Date   Allergic rhinitis 10/24/2009   Altered bowel habits 10/04/2020   Basal cell carcinoma    Benign prostatic hyperplasia with lower urinary tract symptoms 10/24/2009   Benign prostatic hypertrophy    Bradycardia 12/06/2019   Callosity 10/16/2013   Cardiomegaly 01/23/2015   Chronic diastolic heart failure ([facility] 12/19/2018   Chronic respiratory failure ([facility] 10/04/2020   Coronary artery calcification seen on CAT scan 05/28/2020   Dyslipidemia    Dyspnea on exertion  03/18/2018   Emphysema lung ([facility] 03/18/2018   Encounter for general adult medical examination without abnormal findings 01/17/2015   Ex-cigarette smoker 01/20/2021   Exudative age-related macular degeneration of left eye with active choroidal neovascularization ([facility] 12/18/2019   Gallbladder problem    Gastroesophageal reflux disease    Hiatal hernia    Hilar adenopathy 03/25/2018   History of smoking 03/18/2018   Hyperlipidemia 10/24/2009   Insomnia 05/09/2013   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   Internal hemorrhoid 10/04/2020   Macular degeneration    Macular pucker, right eye 04/30/2020   Mediastinal adenopathy 03/25/2018   Memory loss 05/09/2013   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   Mild memory disturbance    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   Noninfective gastroenteritis and colitis, unspecified 10/04/2020   Nuclear sclerotic cataract of right eye 12/18/2019   Other specified symptoms and signs involving the digestive system and abdomen 10/04/2020   Overweight 09/14/2018   Oxygen dependent 10/04/2020   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   Personal history of colonic polyps 10/04/2020   Posterior vitreous detachment of right eye 12/18/2019   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   Pulmonary hypertension ([facility]    Right upper lobe pulmonary nodule  03/25/2018   Syncope and collapse 12/12/2018   TIA (transient ischemic attack)    Transient ischemic attack 10/24/2009   Weight decreased 10/04/2020   Social History   Socioeconomic History   Marital status: Married    Spouse name: pamela   Number of children: 2   Years of education: college   Highest education level: Not on file  Occupational History   Occupation: retired  Tobacco Use   Smoking status: Former    Packs/day: 2.00    Years: 25.00    Total pack years: 50.00    Types: Cigarettes    Quit date: 1994     Years since quitting: 30.2   Smokeless tobacco: Never  Substance and Sexual Activity   Alcohol use: Yes    Alcohol/week: 15.0 - 20.0 standard drinks of alcohol    Types: 15 - 20 Cans of beer per week   Drug use: Never   Sexual activity: Not Currently  Other Topics Concern   Not on file  Social History Narrative   Pt is retired. He is married. The pt lives with his wife. He has a Best boy. The pt has 2 children.      Patient drinks 2-3 cups of caffeine daily.   Patient is right handed.    Social Determinants of Health   Financial Resource Strain: Not on file  Food Insecurity: No Food Insecurity (09/11/2022)   Hunger Vital Sign    Worried About Running Out of Food in the Last Year: Never true    Ran Out of Food in the Last Year: Never true  Transportation Needs: No Transportation Needs (09/11/2022)   PRAPARE - Hydrologist (Medical): No    Lack of Transportation (Non-Medical): No  Physical Activity: Not on file  Stress: Not on file  Social Connections: Not on file   Family History  Problem Relation Age of Onset   Obesity Mother    Memory loss Mother    Scheduled Meds:  aspirin EC  81 mg Oral Daily   atorvastatin  20 mg Oral QPM   donepezil  10 mg Oral Daily   enoxaparin (LOVENOX) injection  40 mg Subcutaneous Q24H   finasteride  5 mg Oral QHS   ipratropium  1-2 spray Each Nare Daily   ipratropium-albuterol  3 mL Nebulization BID   levETIRAcetam  250 mg Oral q AM   And   levETIRAcetam  500 mg Oral QPM   loratadine  10 mg Oral Daily   melatonin  20 mg Oral QHS   traZODone  200 mg Oral QHS   umeclidinium-vilanterol  1 puff Inhalation BID   Continuous Infusions:  azithromycin     cefTRIAXone (ROCEPHIN)  IV     PRN Meds:.acetaminophen **OR** acetaminophen, ondansetron **OR** ondansetron (ZOFRAN) IV, polyethylene glycol Medications Prior to Admission:  Prior to Admission medications   Medication Sig Start Date End Date Taking?  Authorizing Provider  Jearl Klinefelter ELLIPTA 62.5-25 MCG/ACT AEPB USE 1 INHALATION BY MOUTH DAILY 10/26/22   Hunsucker, Bonna Gains, MD  aspirin 81 MG EC tablet Take 81 mg by mouth daily.    [provider]  atorvastatin (LIPITOR) 20 MG tablet Take 20 mg by mouth every evening.    [provider]  azithromycin (ZITHROMAX) 250 MG tablet Take 1 tablet (250 mg total) by mouth daily. 09/16/22   Thurnell Lose, MD  cholecalciferol (VITAMIN D) 1000 UNITS tablet Take 1,000 Units by mouth daily.    [provider]  donepezil (ARICEPT) 10 MG tablet Take 1 tablet (10 mg total) by mouth daily. 10/20/22   Alric Ran, MD  ENTRESTO 24-26 MG TAKE 1 TABLET BY MOUTH DAILY 10/12/22   Park Liter, MD  fexofenadine (ALLEGRA) 180 MG tablet Take 180 mg by mouth daily.    [provider]  finasteride (PROSCAR) 5 MG tablet Take 5 mg by mouth at bedtime.    [provider]  furosemide (LASIX) 20 MG tablet Take 1 tablet (20 mg total) by mouth daily. Patient taking differently: Take 40 mg by mouth daily. Per pt Dr. Silas Flood increased last week 09/29/22 09/24/23  Loel Dubonnet, NP  ipratropium (ATROVENT) 0.06 % nasal spray Place 1-2 sprays into both nostrils See admin instructions. 2 sprays in the right nostril and 1 spray in the left nostril once daily 02/09/13   [provider]  ipratropium-albuterol (DUONEB) 0.5-2.5 (3) MG/3ML SOLN Take 3 mLs by nebulization 2 (two) times daily. 08/25/22   Hunsucker, Bonna Gains, MD  levETIRAcetam (KEPPRA) 250 MG tablet Take 1-2 tablets (250-500 mg total) by mouth See admin instructions. Take 250 mg by mouth in the morning and 500 mg by mouth at night. Patient taking differently: Take 250-500 mg by mouth See admin instructions. Take one tablet (250 mg) by mouth in the morning and two tablets (500 mg) by mouth at night. 07/30/22 10/28/22  Alric Ran, MD  Melatonin 10 MG TABS Take 20 mg by mouth at bedtime.    [provider]   nitroGLYCERIN (NITROSTAT) 0.4 MG SL tablet Place 1 tablet (0.4 mg total) under the tongue every 5 (five) minutes as needed. Chest pain Patient taking differently: Place 0.4 mg under the tongue every 5 (five) minutes as needed for chest pain. Chest pain 07/15/21 09/11/22  Park Liter, MD  omeprazole (PRILOSEC) 40 MG capsule Take 40 mg by mouth daily.  03/29/13   [provider]  OXYGEN Inhale 2-5 L into the lungs as directed. 5lt during the day and 2lt at night    [provider]  thiamine 100 MG tablet Take 1 tablet (100 mg total) by mouth daily. 12/13/18   Geradine Girt, DO  traZODone (DESYREL) 100 MG tablet TAKE 2 TABLETS BY MOUTH AT  BEDTIME 08/25/22   Suzzanne Cloud, NP   No Known Allergies Review of Systems  Constitutional:  Positive for activity change and fatigue. Negative for appetite change.  Respiratory:  Negative for shortness of breath.   Gastrointestinal:  Negative for nausea and vomiting.  Neurological:  Negative for weakness.  All other systems reviewed and are negative.   Physical Exam Vitals and nursing note reviewed.  Constitutional:      General: He is not in acute distress. Pulmonary:     Effort: No respiratory distress.  Skin:    General: Skin is warm and dry.  Neurological:     Mental Status: He is alert and oriented to person, place, and time.  Psychiatric:        Attention and Perception: Attention normal.        Behavior: Behavior is cooperative.        Cognition and Memory: Cognition and memory normal.     Vital Signs: BP 112/64   Pulse 65   Temp 97.7 F (36.5 C) (Oral)   Resp (!) 21   SpO2 100%  Pain Scale: 0-10   Pain Score: 0-No pain   SpO2: SpO2: 100 % O2 Device:SpO2: 100 % O2  Flow Rate: .O2 Flow Rate (L/min): 6 L/min  IO: Intake/output summary:  Intake/Output Summary (Last 24 hours) at 10/28/2022 [id number] Last data filed at 10/28/2022 0746 Gross per 24 hour  Intake 1070.97 ml  Output 1775 ml  Net -704.03 ml     LBM:   Baseline Weight:   Most recent weight:       Palliative Assessment/Data: PPS 50%     Time In: 0815 Time Out: 0945 Time Total: 90 minutes +15 = 105 minutes  Greater than 50%  of this time was spent counseling and coordinating care related to the above assessment and plan.  Signed by: Lin Landsman, NP   Please contact Palliative Medicine Team phone at [phone] for questions and concerns.  For individual provider: See Amion  *Portions of this note are a verbal dictation therefore any spelling and/or grammatical errors are due to the "[place]" system interpretation.

## 2022-10-28 NOTE — Progress Notes (Signed)
Pulmonary Individual Treatment Plan  Patient Details  Name: Frank Meyer MRN: [id number] Date of Birth: [birth year] Referring Provider:   April Manson Pulmonary Rehab Walk Test from 10/09/2022 in [facility] [place]  Referring Provider Hunsucker       Initial Encounter Date:  Flowsheet Row Pulmonary Rehab Walk Test from 10/09/2022 in [facility], & [place]  Date 10/09/22       Visit Diagnosis: Chronic diastolic HF (heart failure) ([place])  Patient's Home Medications on Admission:  No current facility-administered medications for this encounter.  Current Outpatient Medications:    ANORO ELLIPTA 62.5-25 MCG/ACT AEPB, USE 1 INHALATION BY MOUTH DAILY, Disp: 180 each, Rfl: 3   aspirin 81 MG EC tablet, Take 81 mg by mouth daily., Disp: , Rfl:    atorvastatin (LIPITOR) 20 MG tablet, Take 20 mg by mouth every evening., Disp: , Rfl:    azithromycin (ZITHROMAX) 250 MG tablet, Take 1 tablet (250 mg total) by mouth daily., Disp: 2 tablet, Rfl: 0   cholecalciferol (VITAMIN D) 1000 UNITS tablet, Take 1,000 Units by mouth daily., Disp: , Rfl:    donepezil (ARICEPT) 10 MG tablet, Take 1 tablet (10 mg total) by mouth daily., Disp: 90 tablet, Rfl: 3   ENTRESTO 24-26 MG, TAKE 1 TABLET BY MOUTH DAILY, Disp: 100 tablet, Rfl: 2   fexofenadine (ALLEGRA) 180 MG tablet, Take 180 mg by mouth daily., Disp: , Rfl:    finasteride (PROSCAR) 5 MG tablet, Take 5 mg by mouth at bedtime., Disp: , Rfl:    furosemide (LASIX) 20 MG tablet, Take 1 tablet (20 mg total) by mouth daily. (Patient taking differently: Take 40 mg by mouth daily. Per pt Dr. Silas Flood increased last week), Disp: 90 tablet, Rfl: 3   ipratropium (ATROVENT) 0.06 % nasal spray, Place 1-2 sprays into both nostrils See admin instructions. 2 sprays in the right nostril and 1 spray in the left nostril once daily, Disp: , Rfl:    ipratropium-albuterol  (DUONEB) 0.5-2.5 (3) MG/3ML SOLN, Take 3 mLs by nebulization 2 (two) times daily., Disp: 540 mL, Rfl: 3   levETIRAcetam (KEPPRA) 250 MG tablet, Take 1-2 tablets (250-500 mg total) by mouth See admin instructions. Take 250 mg by mouth in the morning and 500 mg by mouth at night. (Patient taking differently: Take 250-500 mg by mouth See admin instructions. Take one tablet (250 mg) by mouth in the morning and two tablets (500 mg) by mouth at night.), Disp: 270 tablet, Rfl: 3   Melatonin 10 MG TABS, Take 20 mg by mouth at bedtime., Disp: , Rfl:    nitroGLYCERIN (NITROSTAT) 0.4 MG SL tablet, Place 1 tablet (0.4 mg total) under the tongue every 5 (five) minutes as needed. Chest pain (Patient taking differently: Place 0.4 mg under the tongue every 5 (five) minutes as needed for chest pain. Chest pain), Disp: 25 tablet, Rfl: 6   omeprazole (PRILOSEC) 40 MG capsule, Take 40 mg by mouth daily. , Disp: , Rfl:    OXYGEN, Inhale 2-5 L into the lungs as directed. 5lt during the day and 2lt at night, Disp: , Rfl:    thiamine 100 MG tablet, Take 1 tablet (100 mg total) by mouth daily., Disp: 30 tablet, Rfl: 0   traZODone (DESYREL) 100 MG tablet, TAKE 2 TABLETS BY MOUTH AT  BEDTIME, Disp: 180 tablet, Rfl: 3  Facility-Administered Medications Ordered in Other Encounters:    acetaminophen (TYLENOL)  tablet 650 mg, 650 mg, Oral, Q6H PRN **OR** acetaminophen (TYLENOL) suppository 650 mg, 650 mg, Rectal, Q6H PRN, Darci Current, DO   aspirin EC tablet 81 mg, 81 mg, Oral, Daily, Darci Current, DO, 81 mg at 10/28/22 0942   atorvastatin (LIPITOR) tablet 20 mg, 20 mg, Oral, QPM, Darci Current, DO, 20 mg at 10/27/22 1644   azithromycin (ZITHROMAX) 500 mg in sodium chloride 0.9 % 250 mL IVPB, 500 mg, Intravenous, Daily, Darci Current, DO, Last Rate: 250 mL/hr at 10/28/22 1023, 500 mg at 10/28/22 1023   cefTRIAXone (ROCEPHIN) 1 g in sodium chloride 0.9 % 100 mL IVPB, 1 g, Intravenous, Daily, Darci Current, DO, Stopped at 10/28/22  1019   donepezil (ARICEPT) tablet 10 mg, 10 mg, Oral, Daily, Darci Current, DO, 10 mg at 10/28/22 0942   enoxaparin (LOVENOX) injection 40 mg, 40 mg, Subcutaneous, Q24H, Darci Current, DO, 40 mg at 10/28/22 0943   finasteride (PROSCAR) tablet 5 mg, 5 mg, Oral, QHS, Darci Current, DO, 5 mg at 10/27/22 2355   [START ON 10/29/2022] furosemide (LASIX) tablet 40 mg, 40 mg, Oral, Windell Hummingbird, MD   ipratropium (ATROVENT) 0.06 % nasal spray 1-2 spray, 1-2 spray, Each Nare, Daily, Darci Current, DO   ipratropium-albuterol (DUONEB) 0.5-2.5 (3) MG/3ML nebulizer solution 3 mL, 3 mL, Nebulization, BID, Darci Current, DO, 3 mL at 10/28/22 0823   levETIRAcetam (KEPPRA) tablet 250 mg, 250 mg, Oral, q AM, 250 mg at 10/28/22 0942 **AND** levETIRAcetam (KEPPRA) tablet 500 mg, 500 mg, Oral, QPM, Ventura Sellers, RPH, 500 mg at 10/27/22 1644   loratadine (CLARITIN) tablet 10 mg, 10 mg, Oral, Daily, Darci Current, DO, 10 mg at 10/28/22 [id number]   melatonin tablet 20 mg, 20 mg, Oral, QHS, Ventura Sellers, RPH, 20 mg at 10/28/22 0009   ondansetron (ZOFRAN) tablet 4 mg, 4 mg, Oral, Q6H PRN **OR** ondansetron (ZOFRAN) injection 4 mg, 4 mg, Intravenous, Q6H PRN, Darci Current, DO   polyethylene glycol (MIRALAX / GLYCOLAX) packet 17 g, 17 g, Oral, Daily PRN, Darci Current, DO   [START ON 10/29/2022] predniSONE (DELTASONE) tablet 40 mg, 40 mg, Oral, Q breakfast, Darci Current, DO   traZODone (DESYREL) tablet 200 mg, 200 mg, Oral, QHS, Miller, Emily, DO, 200 mg at 10/27/22 2356   umeclidinium-vilanterol (ANORO ELLIPTA) 62.5-25 MCG/ACT 1 puff, 1 puff, Inhalation, BID, Darci Current, DO, 1 puff at 10/28/22 0813  Past Medical History: Past Medical History:  Diagnosis Date   Allergic rhinitis 10/24/2009   Altered bowel habits 10/04/2020   Basal cell carcinoma    Benign prostatic hyperplasia with lower urinary tract symptoms 10/24/2009   Benign prostatic hypertrophy    Bradycardia 12/06/2019   Callosity 10/16/2013    Cardiomegaly 01/23/2015   Chronic diastolic heart failure ([place]) 12/19/2018   Chronic respiratory failure ([place]) 10/04/2020   Coronary artery calcification seen on CAT scan 05/28/2020   Dyslipidemia    Dyspnea on exertion 03/18/2018   Emphysema lung ([place]) 03/18/2018   Encounter for general adult medical examination without abnormal findings 01/17/2015   Ex-cigarette smoker 01/20/2021   Exudative age-related macular degeneration of left eye with active choroidal neovascularization ([place]) 12/18/2019   Gallbladder problem    Gastroesophageal reflux disease    Hiatal hernia    Hilar adenopathy 03/25/2018   History of smoking 03/18/2018   Hyperlipidemia 10/24/2009   Insomnia 05/09/2013   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   Internal hemorrhoid 10/04/2020   Macular degeneration    Macular  pucker, right eye 04/30/2020   Mediastinal adenopathy 03/25/2018   Memory loss 05/09/2013   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   Mild memory disturbance    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   Noninfective gastroenteritis and colitis, unspecified 10/04/2020   Nuclear sclerotic cataract of right eye 12/18/2019   Other specified symptoms and signs involving the digestive system and abdomen 10/04/2020   Overweight 09/14/2018   Oxygen dependent 10/04/2020   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   Personal history of colonic polyps 10/04/2020   Posterior vitreous detachment of right eye 12/18/2019   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   Pulmonary hypertension (HCC)    Right upper lobe pulmonary nodule 03/25/2018   Syncope and collapse 12/12/2018   TIA (transient ischemic attack)    Transient ischemic attack 10/24/2009   Weight decreased 10/04/2020    Tobacco Use: Social History   Tobacco Use  Smoking Status Former   Packs/day: 2.00   Years: 25.00   Total pack years: 50.00   Types: Cigarettes   Quit  date: 1994   Years since quitting: 30.2  Smokeless Tobacco Never    Labs: Review Flowsheet  More data may exist      Latest Ref Rng & Units 06/13/2020 07/20/2022 08/02/2022 08/03/2022 10/27/2022  Labs for ITP Cardiac and Pulmonary Rehab  Cholestrol 100 - 199 mg/dL 147  - - - -  LDL (calc) 0 - 99 mg/dL 67  - - - -  HDL-C >39 mg/dL 57  - - - -  Trlycerides 0 - 149 mg/dL 129  - - - -  PH, Arterial 7.35 - 7.45 - 7.402  - - -  PCO2 arterial 32 - 48 mmHg - 37.0  - - -  Bicarbonate 20.0 - 28.0 mmol/L - 23.1  21.2  29.2  30.8   TCO2 22 - 32 mmol/L - 24  23  - 33   Acid-base deficit 0.0 - 2.0 mmol/L - 1.0  6.0  - -  O2 Saturation % - 95  71  32.6  45     Capillary Blood Glucose: Lab Results  Component Value Date   GLUCAP 134 (H) 07/22/2022   GLUCAP 193 (H) 07/21/2022   GLUCAP 100 (H) 12/12/2018   GLUCAP 101 (H) 04/08/2018     Pulmonary Assessment Scores:  Pulmonary Assessment Scores     Row Name 10/09/22 1336         ADL UCSD   ADL Phase Entry     SOB Score total 49       CAT Score   CAT Score 17       mMRC Score   mMRC Score 4             UCSD: Self-administered rating of dyspnea associated with activities of daily living (ADLs) 6-point scale (0 = "not at all" to 5 = "maximal or unable to do because of breathlessness")  Scoring Scores range from 0 to 120.  Minimally important difference is 5 units  CAT: CAT can identify the health impairment of COPD patients and is better correlated with disease progression.  CAT has a scoring range of zero to 40. The CAT score is classified into four groups of low (less than 10), medium (10 - 20), high (21-30) and very high (31-40) based on the impact level of disease on health status. A CAT score over 10 suggests significant symptoms.  A worsening CAT score could be explained by an exacerbation, poor medication adherence, poor  inhaler technique, or progression of COPD or comorbid conditions.  CAT MCID is 2 points  mMRC: mMRC  (Modified Medical Research Council) Dyspnea Scale is used to assess the degree of baseline functional disability in patients of respiratory disease due to dyspnea. No minimal important difference is established. A decrease in score of 1 point or greater is considered a positive change.   Pulmonary Function Assessment:  Pulmonary Function Assessment - 10/09/22 1513       Breath   Bilateral Breath Sounds Decreased    Shortness of Breath No;Limiting activity             Exercise Target Goals: Exercise Program Goal: Individual exercise prescription set using results from initial 6 min walk test and THRR while considering  patient's activity barriers and safety.   Exercise Prescription Goal: Initial exercise prescription builds to 30-45 minutes a day of aerobic activity, 2-3 days per week.  Home exercise guidelines will be given to patient during program as part of exercise prescription that the participant will acknowledge.  Activity Barriers & Risk Stratification:  Activity Barriers & Cardiac Risk Stratification - 10/09/22 1340       Activity Barriers & Cardiac Risk Stratification   Activity Barriers Shortness of Breath;Muscular Weakness;Deconditioning;Other (comment);Balance Concerns;History of Falls    Comments O2    Cardiac Risk Stratification High             6 Minute Walk:  6 Minute Walk     Row Name 10/09/22 1504         6 Minute Walk   Phase Initial     Distance 730 feet     Walk Time 6 minutes     # of Rest Breaks 1  2:40-4:10 due to hypoxia at 82 on 8L/40 FiO2     MPH 1.38     METS 1.06     RPE 9     Perceived Dyspnea  0     VO2 Peak 3.7     Symptoms No  pt sts not SOB     Resting HR 91 bpm     Resting BP 100/64     Resting Oxygen Saturation  90 %     Exercise Oxygen Saturation  during 6 min walk 82 %     Max Ex. HR 105 bpm     Max Ex. BP 94/54     2 Minute Post BP 96/68       Interval HR   1 Minute HR 95     2 Minute HR 104     3 Minute HR  105     4 Minute HR 97     5 Minute HR 96     6 Minute HR 105     2 Minute Post HR 91     Interval Heart Rate? Yes       Interval Oxygen   Interval Oxygen? Yes     Baseline Oxygen Saturation % 90 %     1 Minute Oxygen Saturation % 95 %     1 Minute Liters of Oxygen 8 L     2 Minute Oxygen Saturation % 91 %     2 Minute Liters of Oxygen 8 L     3 Minute Oxygen Saturation % 85 %     3 Minute Liters of Oxygen 10 L     4 Minute Oxygen Saturation % 90 %     4 Minute Liters of Oxygen 10 L  5 Minute Oxygen Saturation % 98 %     5 Minute Liters of Oxygen 10 L     6 Minute Oxygen Saturation % 94 %     6 Minute Liters of Oxygen 10 L     2 Minute Post Oxygen Saturation % 94 %     2 Minute Post Liters of Oxygen 10 L              Oxygen Initial Assessment:  Oxygen Initial Assessment - 10/09/22 1332       Home Oxygen   Home Oxygen Device Portable Concentrator;Home Concentrator;E-Tanks    Sleep Oxygen Prescription Continuous    Liters per minute 2    Home Exercise Oxygen Prescription Continuous    Liters per minute 6    Home Resting Oxygen Prescription Continuous    Liters per minute 5   only due to not being able to change concentrator setting without walking over and back when resting. Prescription is 2L per pt   Compliance with Home Oxygen Use Yes      Initial 6 min Walk   Oxygen Used Continuous    Liters per minute 10    FiO2% 45   venturi     Program Oxygen Prescription   Program Oxygen Prescription Continuous    Liters per minute 10    FiO2% 45    Comments venturi      Intervention   Short Term Goals To learn and exhibit compliance with exercise, home and travel O2 prescription;To learn and understand importance of maintaining oxygen saturations>88%;To learn and demonstrate proper use of respiratory medications;To learn and understand importance of monitoring SPO2 with pulse oximeter and demonstrate accurate use of the pulse oximeter.;To learn and demonstrate proper  pursed lip breathing techniques or other breathing techniques.     Long  Term Goals Demonstrates proper use of MDI's;Compliance with respiratory medication;Exhibits proper breathing techniques, such as pursed lip breathing or other method taught during program session;Maintenance of O2 saturations>88%;Verbalizes importance of monitoring SPO2 with pulse oximeter and return demonstration;Exhibits compliance with exercise, home  and travel O2 prescription             Oxygen Re-Evaluation:  Oxygen Re-Evaluation     Row Name 10/23/22 0804             Program Oxygen Prescription   Program Oxygen Prescription Continuous       Liters per minute 15       FiO2% 45       Comments NRB mask         Home Oxygen   Home Oxygen Device Portable Concentrator;Home Concentrator;E-Tanks       Sleep Oxygen Prescription Continuous       Liters per minute 2       Home Exercise Oxygen Prescription Continuous       Liters per minute 6       Home Resting Oxygen Prescription Continuous       Liters per minute 5  only due to not being able to change concentrator setting without walking over and back when resting. Prescription is 2L per pt       Compliance with Home Oxygen Use Yes         Goals/Expected Outcomes   Short Term Goals To learn and exhibit compliance with exercise, home and travel O2 prescription;To learn and understand importance of maintaining oxygen saturations>88%;To learn and demonstrate proper use of respiratory medications;To learn and understand importance of monitoring SPO2 with  pulse oximeter and demonstrate accurate use of the pulse oximeter.;To learn and demonstrate proper pursed lip breathing techniques or other breathing techniques.        Long  Term Goals Demonstrates proper use of MDI's;Compliance with respiratory medication;Exhibits proper breathing techniques, such as pursed lip breathing or other method taught during program session;Maintenance of O2 saturations>88%;Verbalizes  importance of monitoring SPO2 with pulse oximeter and return demonstration;Exhibits compliance with exercise, home  and travel O2 prescription       Goals/Expected Outcomes Compliance and understanding of oxygen saturation monitoring and breathing techniques to decrease shortness of breath.                Oxygen Discharge (Final Oxygen Re-Evaluation):  Oxygen Re-Evaluation - 10/23/22 0804       Program Oxygen Prescription   Program Oxygen Prescription Continuous    Liters per minute 15    FiO2% 45    Comments NRB mask      Home Oxygen   Home Oxygen Device Portable Concentrator;Home Concentrator;E-Tanks    Sleep Oxygen Prescription Continuous    Liters per minute 2    Home Exercise Oxygen Prescription Continuous    Liters per minute 6    Home Resting Oxygen Prescription Continuous    Liters per minute 5   only due to not being able to change concentrator setting without walking over and back when resting. Prescription is 2L per pt   Compliance with Home Oxygen Use Yes      Goals/Expected Outcomes   Short Term Goals To learn and exhibit compliance with exercise, home and travel O2 prescription;To learn and understand importance of maintaining oxygen saturations>88%;To learn and demonstrate proper use of respiratory medications;To learn and understand importance of monitoring SPO2 with pulse oximeter and demonstrate accurate use of the pulse oximeter.;To learn and demonstrate proper pursed lip breathing techniques or other breathing techniques.     Long  Term Goals Demonstrates proper use of MDI's;Compliance with respiratory medication;Exhibits proper breathing techniques, such as pursed lip breathing or other method taught during program session;Maintenance of O2 saturations>88%;Verbalizes importance of monitoring SPO2 with pulse oximeter and return demonstration;Exhibits compliance with exercise, home  and travel O2 prescription    Goals/Expected Outcomes Compliance and understanding  of oxygen saturation monitoring and breathing techniques to decrease shortness of breath.             Initial Exercise Prescription:  Initial Exercise Prescription - 10/09/22 1500       Date of Initial Exercise RX and Referring Provider   Date 10/09/22    Referring Provider Hunsucker    Expected Discharge Date 01/07/23      Oxygen   Oxygen Continuous    Liters 10L/45 FiO2    Maintain Oxygen Saturation 88% or higher      Recumbant Bike   Level 1    RPM 70    Minutes 15    METs 1.5      NuStep   Level 1    SPM 70    Minutes 15    METs 1.5      Prescription Details   Frequency (times per week) 2    Duration Progress to 30 minutes of continuous aerobic without signs/symptoms of physical distress      Intensity   THRR 40-80% of Max Heartrate 56-111    Ratings of Perceived Exertion 11-13    Perceived Dyspnea 0-4      Progression   Progression Continue to progress workloads to maintain intensity without  signs/symptoms of physical distress.      Resistance Training   Training Prescription Yes    Weight red bands    Reps 10-15             Perform Capillary Blood Glucose checks as needed.  Exercise Prescription Changes:   Exercise Prescription Changes     Row Name 10/22/22 1200             Response to Exercise   Blood Pressure (Admit) 110/70       Blood Pressure (Exercise) 118/68       Blood Pressure (Exit) 98/60       Heart Rate (Admit) 94 bpm       Heart Rate (Exercise) 92 bpm       Heart Rate (Exit) 87 bpm       Oxygen Saturation (Admit) 93 %       Oxygen Saturation (Exercise) 90 %  15L @ 55%       Oxygen Saturation (Exit) 93 %       Rating of Perceived Exertion (Exercise) 11       Perceived Dyspnea (Exercise) 1       Duration Progress to 30 minutes of  aerobic without signs/symptoms of physical distress       Intensity THRR unchanged         Progression   Progression Continue to progress workloads to maintain intensity without  signs/symptoms of physical distress.         Resistance Training   Training Prescription Yes       Weight red bands       Reps 10-15       Time 10 Minutes         Oxygen   Oxygen Continuous       Liters 15L/55 FiO2         Recumbant Bike   Level 1       Minutes 15       METs 1.8         NuStep   Level 3       SPM 80       Minutes 15       METs 2.4         Oxygen   Maintain Oxygen Saturation 88% or higher                Exercise Comments:   Exercise Goals and Review:   Exercise Goals     Row Name 10/09/22 1335 10/23/22 0801           Exercise Goals   Increase Physical Activity Yes Yes      Intervention Provide advice, education, support and counseling about physical activity/exercise needs.;Develop an individualized exercise prescription for aerobic and resistive training based on initial evaluation findings, risk stratification, comorbidities and participant's personal goals. Provide advice, education, support and counseling about physical activity/exercise needs.;Develop an individualized exercise prescription for aerobic and resistive training based on initial evaluation findings, risk stratification, comorbidities and participant's personal goals.      Expected Outcomes Short Term: Attend rehab on a regular basis to increase amount of physical activity.;Long Term: Add in home exercise to make exercise part of routine and to increase amount of physical activity.;Long Term: Exercising regularly at least 3-5 days a week. Short Term: Attend rehab on a regular basis to increase amount of physical activity.;Long Term: Add in home exercise to make exercise part of routine and to increase amount of physical activity.;Long  Term: Exercising regularly at least 3-5 days a week.      Increase Strength and Stamina Yes Yes      Intervention Provide advice, education, support and counseling about physical activity/exercise needs.;Develop an individualized exercise prescription for  aerobic and resistive training based on initial evaluation findings, risk stratification, comorbidities and participant's personal goals. Provide advice, education, support and counseling about physical activity/exercise needs.;Develop an individualized exercise prescription for aerobic and resistive training based on initial evaluation findings, risk stratification, comorbidities and participant's personal goals.      Expected Outcomes Short Term: Increase workloads from initial exercise prescription for resistance, speed, and METs.;Short Term: Perform resistance training exercises routinely during rehab and add in resistance training at home;Long Term: Improve cardiorespiratory fitness, muscular endurance and strength as measured by increased METs and functional capacity (6MWT) Short Term: Increase workloads from initial exercise prescription for resistance, speed, and METs.;Short Term: Perform resistance training exercises routinely during rehab and add in resistance training at home;Long Term: Improve cardiorespiratory fitness, muscular endurance and strength as measured by increased METs and functional capacity (6MWT)      Able to understand and use rate of perceived exertion (RPE) scale Yes Yes      Intervention Provide education and explanation on how to use RPE scale Provide education and explanation on how to use RPE scale      Expected Outcomes Short Term: Able to use RPE daily in rehab to express subjective intensity level;Long Term:  Able to use RPE to guide intensity level when exercising independently Short Term: Able to use RPE daily in rehab to express subjective intensity level;Long Term:  Able to use RPE to guide intensity level when exercising independently      Able to understand and use Dyspnea scale Yes Yes      Intervention Provide education and explanation on how to use Dyspnea scale Provide education and explanation on how to use Dyspnea scale      Expected Outcomes Short Term: Able  to use Dyspnea scale daily in rehab to express subjective sense of shortness of breath during exertion;Long Term: Able to use Dyspnea scale to guide intensity level when exercising independently Short Term: Able to use Dyspnea scale daily in rehab to express subjective sense of shortness of breath during exertion;Long Term: Able to use Dyspnea scale to guide intensity level when exercising independently      Knowledge and understanding of Target Heart Rate Range (THRR) Yes Yes      Intervention Provide education and explanation of THRR including how the numbers were predicted and where they are located for reference Provide education and explanation of THRR including how the numbers were predicted and where they are located for reference      Expected Outcomes Short Term: Able to state/look up THRR;Long Term: Able to use THRR to govern intensity when exercising independently;Short Term: Able to use daily as guideline for intensity in rehab Short Term: Able to state/look up THRR;Long Term: Able to use THRR to govern intensity when exercising independently;Short Term: Able to use daily as guideline for intensity in rehab      Understanding of Exercise Prescription Yes Yes      Intervention Provide education, explanation, and written materials on patient's individual exercise prescription Provide education, explanation, and written materials on patient's individual exercise prescription      Expected Outcomes Short Term: Able to explain program exercise prescription;Long Term: Able to explain home exercise prescription to exercise independently Short Term: Able to explain program  exercise prescription;Long Term: Able to explain home exercise prescription to exercise independently               Exercise Goals Re-Evaluation :  Exercise Goals Re-Evaluation     Row Name 10/23/22 0801             Exercise Goal Re-Evaluation   Exercise Goals Review Increase Physical Activity;Increase Strength and  Stamina;Knowledge and understanding of Target Heart Rate Range (THRR);Able to understand and use rate of perceived exertion (RPE) scale;Able to understand and use Dyspnea scale;Understanding of Exercise Prescription       Comments Dionis has completed 3 exercise sessions. He exercises for 15 min on the recumbent bike and Nustep. Skip performs the warmup and cooldown standing without limitations. He is very deconditioned. It is too soon to notate any discernable progressions. Will continue to monitor and progress as able.       Expected Outcomes Through exercise at rehab and home, the patient will decrease shortness of breath with daily activities and feel confident in carrying out an exercise regimen at home.                Discharge Exercise Prescription (Final Exercise Prescription Changes):  Exercise Prescription Changes - 10/22/22 1200       Response to Exercise   Blood Pressure (Admit) 110/70    Blood Pressure (Exercise) 118/68    Blood Pressure (Exit) 98/60    Heart Rate (Admit) 94 bpm    Heart Rate (Exercise) 92 bpm    Heart Rate (Exit) 87 bpm    Oxygen Saturation (Admit) 93 %    Oxygen Saturation (Exercise) 90 %   15L @ 55%   Oxygen Saturation (Exit) 93 %    Rating of Perceived Exertion (Exercise) 11    Perceived Dyspnea (Exercise) 1    Duration Progress to 30 minutes of  aerobic without signs/symptoms of physical distress    Intensity THRR unchanged      Progression   Progression Continue to progress workloads to maintain intensity without signs/symptoms of physical distress.      Resistance Training   Training Prescription Yes    Weight red bands    Reps 10-15    Time 10 Minutes      Oxygen   Oxygen Continuous    Liters 15L/55 FiO2      Recumbant Bike   Level 1    Minutes 15    METs 1.8      NuStep   Level 3    SPM 80    Minutes 15    METs 2.4      Oxygen   Maintain Oxygen Saturation 88% or higher             Nutrition:  Target Goals:  Understanding of nutrition guidelines, daily intake of sodium '1500mg'$ , cholesterol '200mg'$ , calories 30% from fat and 7% or less from saturated fats, daily to have 5 or more servings of fruits and vegetables.  Biometrics:    Nutrition Therapy Plan and Nutrition Goals:  Nutrition Therapy & Goals - 10/15/22 1151       Nutrition Therapy   Diet Heart healthy diet    Drug/Food Interactions Statins/Certain Fruits      Personal Nutrition Goals   Nutrition Goal Patient to reduce sodium intake '2300mg'$  per day    Personal Goal #2 Patient to improve diet quality by using the plate method as a daily guide for meal planning to include lean protein/plant protein, fruits, vegetables, whole  grains, nonfat dairy as part of well balanced diet    Comments Luis reports eating three meals daily and stable/normal appetite. His diet consist of cereal, sandwiches, soup, v8, etc. He does drink two beers daily. Damarco will continue to benefit from participation in pulmonary rehab for nutrition, exercise, and lifestyle modification.      Intervention Plan   Intervention Prescribe, educate and counsel regarding individualized specific dietary modifications aiming towards targeted core components such as weight, hypertension, lipid management, diabetes, heart failure and other comorbidities.;Nutrition handout(s) given to patient.    Expected Outcomes Short Term Goal: Understand basic principles of dietary content, such as calories, fat, sodium, cholesterol and nutrients.;Long Term Goal: Adherence to prescribed nutrition plan.             Nutrition Assessments:  Nutrition Assessments - 10/20/22 1219       Rate Your Plate Scores   Pre Score 62            MEDIFICTS Score Key: ?70 Need to make dietary changes  40-70 Heart Healthy Diet ? 40 Therapeutic Level Cholesterol Diet  Flowsheet Row PULMONARY REHAB OTHER RESPIRATORY from 10/20/2022 in [facility]  Picture Your Plate Total Score on Admission 62      Picture Your Plate Scores: [id number] Unhealthy dietary pattern with much room for improvement. 41-50 Dietary pattern unlikely to meet recommendations for good health and room for improvement. 51-60 More healthful dietary pattern, with some room for improvement.  >60 Healthy dietary pattern, although there may be some specific behaviors that could be improved.    Nutrition Goals Re-Evaluation:  Nutrition Goals Re-Evaluation     [place] Name 10/15/22 1151             Goals   Current Weight 168 lb 14 oz (76.6 kg)       Comment lipids WNL       Expected Outcome Patient with medical history of TIA, pulmonary hypertension, and congestive heart failure. Lovie reports eating three meals daily and stable/normal appetite. His diet consist of cereal, sandwiches, soup, v8, etc. He does drink two beers daily. Jaivyn will continue to benefit from participation in pulmonary rehab for nutrition, exercise, and lifestyle modification.                Nutrition Goals Discharge (Final Nutrition Goals Re-Evaluation):  Nutrition Goals Re-Evaluation - 10/15/22 1151       Goals   Current Weight 168 lb 14 oz (76.6 kg)    Comment lipids WNL    Expected Outcome Patient with medical history of TIA, pulmonary hypertension, and congestive heart failure. Galen reports eating three meals daily and stable/normal appetite. His diet consist of cereal, sandwiches, soup, v8, etc. He does drink two beers daily. Ferrell will continue to benefit from participation in pulmonary rehab for nutrition, exercise, and lifestyle modification.             Psychosocial: Target Goals: Acknowledge presence or absence of significant depression and/or stress, maximize coping skills, provide positive support system. Participant is able to verbalize types and ability to use techniques and skills needed for reducing stress and depression.  Initial Review &  Psychosocial Screening:  Initial Psych Review & Screening - 10/09/22 1328       Initial Review   Current issues with None Identified   meds have controlled sleep issues     Family Dynamics   Good Support System? Yes    Comments  wife, daughter      Screening Interventions   Interventions Encouraged to exercise    Expected Outcomes Short Term goal: Utilizing psychosocial counselor, staff and physician to assist with identification of specific Stressors or current issues interfering with healing process. Setting desired goal for each stressor or current issue identified.;Long Term Goal: Stressors or current issues are controlled or eliminated.;Short Term goal: Identification and review with participant of any Quality of Life or Depression concerns found by scoring the questionnaire.;Long Term goal: The participant improves quality of Life and PHQ9 Scores as seen by post scores and/or verbalization of changes             Quality of Life Scores:  Scores of 19 and below usually indicate a poorer quality of life in these areas.  A difference of  2-3 points is a clinically meaningful difference.  A difference of 2-3 points in the total score of the Quality of Life Index has been associated with significant improvement in overall quality of life, self-image, physical symptoms, and general health in studies assessing change in quality of life.  PHQ-9: Review Flowsheet       10/09/2022  Depression screen PHQ 2/9  Decreased Interest 0  Down, Depressed, Hopeless 0  PHQ - 2 Score 0  Altered sleeping 0  Tired, decreased energy 0  Change in appetite 0  Feeling bad or failure about yourself  0  Trouble concentrating 0  Moving slowly or fidgety/restless 0  Suicidal thoughts 0  PHQ-9 Score 0  Difficult doing work/chores Not difficult at all   Interpretation of Total Score  Total Score Depression Severity:  1-4 = Minimal depression, 5-9 = Mild depression, 10-14 = Moderate depression, 15-19 =  Moderately severe depression, 20-27 = Severe depression   Psychosocial Evaluation and Intervention:  Psychosocial Evaluation - 10/09/22 1330       Psychosocial Evaluation & Interventions   Interventions Stress management education;Relaxation education;Encouraged to exercise with the program and follow exercise prescription    Expected Outcomes Pt to participate in [place]  No Follow up required             Psychosocial Re-Evaluation:  Psychosocial Re-Evaluation     [place] Name 10/21/22 1253             Psychosocial Re-Evaluation   Current issues with None Identified       Comments Deshan denies any psychosocial barriers at this time.       Expected Outcomes For Jermany to participate in PR free of psychosocial concerns       Interventions Encouraged to attend Pulmonary Rehabilitation for the exercise       Continue Psychosocial Services  No Follow up required                Psychosocial Discharge (Final Psychosocial Re-Evaluation):  Psychosocial Re-Evaluation - 10/21/22 1253       Psychosocial Re-Evaluation   Current issues with None Identified    Comments Orhan denies any psychosocial barriers at this time.    Expected Outcomes For Firmin to participate in PR free of psychosocial concerns    Interventions Encouraged to attend Pulmonary Rehabilitation for the exercise    Continue Psychosocial Services  No Follow up required             Education: Education Goals: Education classes will be provided on a weekly basis, covering required topics. Participant will state understanding/return demonstration of topics presented.  Learning Barriers/Preferences:  Learning  Barriers/Preferences - 10/09/22 1330       Learning Barriers/Preferences   Learning Barriers Exercise Concerns   hypoxia   Learning Preferences Audio;Verbal Instruction;Individual Instruction             Education Topics: Introduction to Pulmonary  Rehab Group instruction provided by PowerPoint, verbal discussion, and written material to support subject matter. Instructor reviews what Pulmonary Rehab is, the purpose of the program, and how patients are referred.     Know Your Numbers Group instruction that is supported by a PowerPoint presentation. Instructor discusses importance of knowing and understanding resting, exercise, and post-exercise oxygen saturation, heart rate, and blood pressure. Oxygen saturation, heart rate, blood pressure, rating of perceived exertion, and dyspnea are reviewed along with a normal range for these values.    Exercise for the Pulmonary Patient Group instruction that is supported by a PowerPoint presentation. Instructor discusses benefits of exercise, core components of exercise, frequency, duration, and intensity of an exercise routine, importance of utilizing pulse oximetry during exercise, safety while exercising, and options of places to exercise outside of rehab.       MET Level  Group instruction provided by PowerPoint, verbal discussion, and written material to support subject matter. Instructor reviews what METs are and how to increase METs.    Pulmonary Medications Verbally interactive group education provided by instructor with focus on inhaled medications and proper administration.   Anatomy and Physiology of the Respiratory System Group instruction provided by PowerPoint, verbal discussion, and written material to support subject matter. Instructor reviews respiratory cycle and anatomical components of the respiratory system and their functions. Instructor also reviews differences in obstructive and restrictive respiratory diseases with examples of each.  Flowsheet Row PULMONARY REHAB OTHER RESPIRATORY from 10/22/2022 in [facility]  Date 10/22/22  Educator Ep  Instruction Review Code 1- Verbalizes Understanding       Oxygen  Safety Group instruction provided by PowerPoint, verbal discussion, and written material to support subject matter. There is an overview of "What is Oxygen" and "Why do we need it".  Instructor also reviews how to create a safe environment for oxygen use, the importance of using oxygen as prescribed, and the risks of noncompliance. There is a brief discussion on traveling with oxygen and resources the patient may utilize.   Oxygen Use Group instruction provided by PowerPoint, verbal discussion, and written material to discuss how supplemental oxygen is prescribed and different types of oxygen supply systems. Resources for more information are provided.    Breathing Techniques Group instruction that is supported by demonstration and informational handouts. Instructor discusses the benefits of pursed lip and diaphragmatic breathing and detailed demonstration on how to perform both.     Risk Factor Reduction Group instruction that is supported by a PowerPoint presentation. Instructor discusses the definition of a risk factor, different risk factors for pulmonary disease, and how the heart and lungs work together.   MD Day A group question and answer session with a medical doctor that allows participants to ask questions that relate to their pulmonary disease state.   Nutrition for the Pulmonary Patient Group instruction provided by PowerPoint slides, verbal discussion, and written materials to support subject matter. The instructor gives an explanation and review of healthy diet recommendations, which includes a discussion on weight management, recommendations for fruit and vegetable consumption, as well as protein, fluid, caffeine, fiber, sodium, sugar, and alcohol. Tips for eating when patients are short of breath are  discussed.    Other Education Group or individual verbal, written, or video instructions that support the educational goals of the pulmonary rehab program.    Knowledge  Questionnaire Score:  Knowledge Questionnaire Score - 10/09/22 1512       Knowledge Questionnaire Score   Pre Score 16/18             Core Components/Risk Factors/Patient Goals at Admission:  Personal Goals and Risk Factors at Admission - 10/09/22 1331       Core Components/Risk Factors/Patient Goals on Admission   Improve shortness of breath with ADL's Yes    Intervention Provide education, individualized exercise plan and daily activity instruction to help decrease symptoms of SOB with activities of daily living.    Expected Outcomes Short Term: Improve cardiorespiratory fitness to achieve a reduction of symptoms when performing ADLs;Long Term: Be able to perform more ADLs without symptoms or delay the onset of symptoms    Heart Failure Yes    Intervention Provide a combined exercise and nutrition program that is supplemented with education, support and counseling about heart failure. Directed toward relieving symptoms such as shortness of breath, decreased exercise tolerance, and extremity edema.    Expected Outcomes Improve functional capacity of life;Short term: Attendance in program 2-3 days a week with increased exercise capacity. Reported lower sodium intake. Reported increased fruit and vegetable intake. Reports medication compliance.;Short term: Daily weights obtained and reported for increase. Utilizing diuretic protocols set by physician.;Long term: Adoption of self-care skills and reduction of barriers for early signs and symptoms recognition and intervention leading to self-care maintenance.             Core Components/Risk Factors/Patient Goals Review:   Goals and Risk Factor Review     Row Name 10/21/22 1254             Core Components/Risk Factors/Patient Goals Review   Personal Goals Review Develop more efficient breathing techniques such as purse lipped breathing and diaphragmatic breathing and practicing self-pacing with activity.;Increase knowledge of  respiratory medications and ability to use respiratory devices properly.;Improve shortness of breath with ADL's       Review Asaph has attended 2 PR classes so far. He is currently exercising on the recumbent bike and the Nustep. He has been able to increase his workload on the Nustep. He likes coming to class so far. He is practicing pursed lip breathing. His O2 had to be increased from 45%- 55% venti mask with sats 94-98% while exercising. We will continue to monitor [place] progress throughout the program.       Expected Outcomes See admission goals                Core Components/Risk Factors/Patient Goals at Discharge (Final Review):   Goals and Risk Factor Review - 10/21/22 1254       Core Components/Risk Factors/Patient Goals Review   Personal Goals Review Develop more efficient breathing techniques such as purse lipped breathing and diaphragmatic breathing and practicing self-pacing with activity.;Increase knowledge of respiratory medications and ability to use respiratory devices properly.;Improve shortness of breath with ADL's    Review Tanush has attended 2 PR classes so far. He is currently exercising on the recumbent bike and the Nustep. He has been able to increase his workload on the Nustep. He likes coming to class so far. He is practicing pursed lip breathing. His O2 had to be increased from 45%- 55% venti mask with sats 94-98% while exercising. We will continue to monitor  [place] progress throughout the program.    Expected Outcomes See admission goals             ITP Comments:   Comments: Dr. Rodman Pickle is Medical Director for Pulmonary Rehab at [facility].

## 2022-10-28 NOTE — Assessment & Plan Note (Addendum)
Cr 1.03 and K stable this AM. Output 2.1L yesterday. AM weight 75.3kg.  Discharge weight ~73 kg.  -restart lasix 40 QOD and close follow-up re lasix regimen -daily weights

## 2022-10-28 NOTE — ED Notes (Signed)
Patient currently resting quietly with even and unlabored respirations.

## 2022-10-28 NOTE — Progress Notes (Signed)
Daily Progress Note Intern Pager: [phone]  Patient name: Frank Meyer Medical record number: [id number] Date of birth: 01/01/1941 Age: 82 y.o. Gender: male  Primary Care Provider: Burnard Bunting, MD Consultants: none Code Status: DNR  Pt Overview and Major Events to Date:  3/12 - admitted  Assessment and Plan: [name] is a 82 y.o. male presenting with acute hypoxic respiratory failure due to pneumonia. Patient is severely ill and does not have a good prognosis.    Pertinent PMH/PSH includes chronic respiratory failure, pan lobar emphysema, ex cigarette smoker, chronic diastolic heart failure, pulmonary HTN, seizure, memory loss, history of TIA.   * Acute hypoxic respiratory failure (HCC) 2/2 to pneumonia. Patient satted on 6L HFNC overnight. Will likely need prolonged antibiotic course due to severity of pulmonary disease, most likely 5 day course. S/p 2L fluid bolus. Start steroids. Follows with pulmonology outpatient.  - Continue antibiotics  - Rocephin (3/12 - 3/16)  - Azithromycin (3/12 - 3/14) - Start prednisone '40mg'$  daily (3/14-3/18) - Consult to palliative care for goals of care discussion, appreciate recs  - Wean to home 8-10L [facility] as able  - Continue DuoNebs every 6 hours - Continue home medications: Anora Ellipta, Allegra, Atrovent nasal spray - low threshold to consult pulmonology if respiratory status worsens   Chronic diastolic CHF (congestive heart failure) (HCC) BNP 125.4 on admission. On lasix '40mg'$  daily at home. Cr 1.06 this AM, decreased from 1.43 yesterday. K 4.4 this AM. Received 2L bolus yesterday, output 730m yesterday. Seen by cardiology outpatient who recommended reducing lasix to '20mg'$  daily. Suspected acute on chronic diastolic HF as cause of SOB during 08/2022 admission. Discharge weight ~73 kg.  -restart lasix 40 QOD and close follow-up re lasix regimen -daily weights    Stable chronic conditions: Insomnia: Continue melatonin  and trazodone nightly GERD: Continue Prilosec 40 mg HLD: Continue Lipitor 20 mg Seizures continue Keppra 250 mg a.m., 500 mg nightly BPH: Continue finasteride 5 mg Memory loss: Continue donepezil 10 mg  FEN/GI: Regular diet, fluid restriction 1800 mL  PPx: Lovenox  Dispo: Pending. Barriers include clinical improvement, clarifying goals of care.    Subjective:  Patient seen on 6L HFNC sitting upright in bed. He denies any current SOB. He denies any chest pain. Unclear what lasix does he is on. He reports that he has been taking lasix 40 since last admission for acute hypoxic respiratory failure which were thought to be due to acute on chronic diastolic HF. This was then decreased to 20 when he went to cardiology but he states he has been taking '40mg'$  daily per pulmonology. Reports PCP rec 80 every other day. 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   Objective: Temp:  [97.5 F (36.4 C)-98.3 F (36.8 C)] 97.7 F (36.5 C) (03/13 0742) Pulse Rate:  [56-106] 90 (03/13 0900) Resp:  [13-24] 23 (03/13 0900) BP: (86-133)/(46-83) 115/75 (03/13 0900) SpO2:  [94 %-100 %] 100 % (03/13 0900) Weight:  [76.2 kg] 76.2 kg (03/13 0846)  Physical Exam: General: alert, in no acute distress, satting well on 6L HFNC  Cardiovascular: regular rate and rhythm Respiratory: prolonged expiratory phase throughout. Breath sounds diminished throughout. Satting well on 6L HFNC.  Abdomen: soft, non-tender, non-distended  Extremities: no pitting edema noted.   Laboratory: Most recent CBC Lab Results  Component Value Date   WBC 17.6 (H) 10/27/2022   HGB 14.3 10/27/2022   HCT 42.0 10/27/2022  MCV 86.6 10/27/2022   PLT 311 10/27/2022   Most recent BMP    Latest Ref Rng & Units 10/28/2022    4:28 AM  BMP  Glucose 70 - 99 mg/dL 150   BUN 8 - 23 mg/dL 18   Creatinine 0.61 - 1.24 mg/dL 1.06   Sodium 135 - 145 mmol/L 137   Potassium 3.5 - 5.1 mmol/L 4.4   Chloride 98 - 111  mmol/L 103   CO2 22 - 32 mmol/L 25   Calcium 8.9 - 10.3 mg/dL 8.8    Bcx pending  LA 2.2 > 1.3 Troponin 6  Rolanda Lundborg, MD 10/28/2022, 11:01 AM  PGY-1, [place] Intern pager: [phone], text pages welcome Secure chat group [place]

## 2022-10-28 NOTE — Plan of Care (Signed)
  Problem: Education: Goal: Knowledge of General Education information will improve Description: Including pain rating scale, medication(s)/side effects and non-pharmacologic comfort measures Outcome: Progressing   Problem: Safety: Goal: Ability to remain free from injury will improve Outcome: Progressing   

## 2022-10-29 ENCOUNTER — Encounter (HOSPITAL_COMMUNITY): Payer: Medicare Other

## 2022-10-29 DIAGNOSIS — J9611 Chronic respiratory failure with hypoxia: Secondary | ICD-10-CM

## 2022-10-29 DIAGNOSIS — J431 Panlobular emphysema: Secondary | ICD-10-CM

## 2022-10-29 DIAGNOSIS — J9622 Acute and chronic respiratory failure with hypercapnia: Secondary | ICD-10-CM

## 2022-10-29 DIAGNOSIS — I5032 Chronic diastolic (congestive) heart failure: Secondary | ICD-10-CM

## 2022-10-29 DIAGNOSIS — J159 Unspecified bacterial pneumonia: Secondary | ICD-10-CM

## 2022-10-29 DIAGNOSIS — J9621 Acute and chronic respiratory failure with hypoxia: Secondary | ICD-10-CM

## 2022-10-29 DIAGNOSIS — D649 Anemia, unspecified: Secondary | ICD-10-CM | POA: Insufficient documentation

## 2022-10-29 DIAGNOSIS — J9612 Chronic respiratory failure with hypercapnia: Secondary | ICD-10-CM

## 2022-10-29 LAB — CBC
HCT: 33 % — ABNORMAL LOW (ref 39.0–52.0)
Hemoglobin: 10.2 g/dL — ABNORMAL LOW (ref 13.0–17.0)
MCH: 26.8 pg (ref 26.0–34.0)
MCHC: 30.9 g/dL (ref 30.0–36.0)
MCV: 86.6 fL (ref 80.0–100.0)
Platelets: 231 10*3/uL (ref 150–400)
RBC: 3.81 MIL/uL — ABNORMAL LOW (ref 4.22–5.81)
RDW: 14.8 % (ref 11.5–15.5)
WBC: 12.4 10*3/uL — ABNORMAL HIGH (ref 4.0–10.5)
nRBC: 0 % (ref 0.0–0.2)

## 2022-10-29 LAB — BASIC METABOLIC PANEL
Anion gap: 5 (ref 5–15)
BUN: 21 mg/dL (ref 8–23)
CO2: 28 mmol/L (ref 22–32)
Calcium: 8.1 mg/dL — ABNORMAL LOW (ref 8.9–10.3)
Chloride: 107 mmol/L (ref 98–111)
Creatinine, Ser: 1.1 mg/dL (ref 0.61–1.24)
GFR, Estimated: >60 mL/min (ref >60)
Glucose, Bld: 127 mg/dL — ABNORMAL HIGH (ref 70–99)
Potassium: 4.1 mmol/L (ref 3.5–5.1)
Sodium: 140 mmol/L (ref 135–145)

## 2022-10-29 MED ORDER — CEFDINIR 300 MG PO CAPS
300.0000 mg | ORAL_CAPSULE | Freq: Two times a day (BID) | ORAL | Status: DC
  Administered 2022-10-30: 300 mg via ORAL
  Filled 2022-10-29: qty 1

## 2022-10-29 NOTE — Progress Notes (Signed)
Daily Progress Note   Patient Name: Frank Meyer       Date: 10/29/2022 DOB: [birth year]  Age: 82 y.o. MRN#: [id number] Attending Physician: Lenoria Chime, MD Primary Care Physician: Burnard Bunting, MD Admit Date: 10/27/2022  Reason for Consultation/Follow-up: Establishing goals of care  Subjective: Medical records reviewed including progress notes, labs, imaging. Patient assessed at the bedside.  He tells me he is very nervous about our family meeting and he was was quite alarmed by conversations about hospice yesterday.  His wife is present visiting.  We thoroughly reviewed the difference between palliative care and hospice.  Patient shares that he is clearly not ready for end-of-life at this time.  While he does not enjoy spending time at the hospital and being away from his loved ones or dog, he currently finds it to be an acceptable trade off for some additional time spent with them at home.  He feels comfortable with his current level of symptom burden. He also acknowledges that he has a prognosis of months and that his lung disease is incurable/irreversible.  He is not ready for hospice or to "have them to help me die."    Counseled that he will likely become less comfortable gradually and that hospice would benefit him in situations where he is not quite sick enough to go to the hospital, but also not satisfied with his quality of life either.  Outpatient palliative care was then explained and offered.  A MOST form was introduced and reviewed.  He is agreeable to outpatient palliative care, but not quite ready to proceed with completion of the MOST form.  Questions and concerns addressed. PMT will continue to support holistically.   Length of Stay: 1  Physical Exam Vitals and  nursing note reviewed.  Constitutional:      General: He is not in acute distress.    Appearance: He is ill-appearing.  Cardiovascular:     Rate and Rhythm: Normal rate.  Pulmonary:     Effort: Pulmonary effort is normal. No tachypnea.  Neurological:     Mental Status: He is alert and oriented to person, place, and time.  Psychiatric:        Mood and Affect: Mood is anxious.        Behavior: Behavior normal.  Vital Signs: BP 112/74 (BP Location: Right Arm)   Pulse 68   Temp 98.1 F (36.7 C) (Oral)   Resp 20   Ht '5\' 6"'$  (1.676 m)   Wt 77.8 kg   SpO2 90%   BMI 27.68 kg/m  SpO2: SpO2: 90 % O2 Device: O2 Device: Nasal Cannula O2 Flow Rate: O2 Flow Rate (L/min): 3 L/min      Palliative Assessment/Data: 50%   Palliative Care Assessment & Plan   Patient Profile: 82 y.o. male  with past medical history of COPD with chronic hypoxic respiratory failure, pulmonary hypertension, chronic diastolic CHF, memory loss, and history of seizures presented to ED on 10/27/2022 from home with complaints of shortness of breath and oxygen saturations in the 40s to 50s.  Patient was admitted on 10/27/2022 with acute hypoxic respiratory failure secondary to acute community-acquired bilateral pneumonia in setting of longstanding COPD/emphysema.    Of note, patient has had 3 admissions in 6 months.  Assessment: Goals of care conversation Acute on chronic respiratory failure Pan lobar emphysema Pulmonary hypertension Diastolic CHF  Recommendations/Plan: Continue DNR/DNI Continue full scope treatment TOC consulted for outpatient palliative care referral ([facility] [place] [facility]) for ongoing goals of care discussions TOC previously consulted for assistance with DME; patient will need portable oxygen that can go up to 10 L Psychosocial and emotional support provided PMT will continue to follow and support as needed   Prognosis: Months  Discharge Planning: Home with  [place] was discussed with patient, patient's wife   MDM high         [place], PA-C  Palliative Medicine Team Team phone # [phone]  Thank you for allowing the Palliative Medicine Team to assist in the care of this patient. Please utilize secure chat with additional questions, if there is no response within 30 minutes please call the above phone number.  Palliative Medicine Team providers are available by phone from 7am to 7pm daily and can be reached through the team cell phone.  Should this patient require assistance outside of these hours, please call the patient's attending physician.

## 2022-10-29 NOTE — Progress Notes (Addendum)
Daily Progress Note Intern Pager: [phone]  Patient name: Frank Meyer Medical record number: [id number] Date of birth: [birth year] Age: 82 y.o. Gender: male  Primary Care Provider: Burnard Bunting, MD Consultants: none Code Status: DNR  Pt Overview and Major Events to Date:  3/12 - admitted   Assessment and Plan: Frank Meyer is a 82 y.o. male presenting with acute hypoxic respiratory failure due to pneumonia. Patient is severely ill and does not have a good prognosis.     Pertinent PMH/PSH includes chronic respiratory failure, pan lobar emphysema, ex cigarette smoker, chronic diastolic heart failure, pulmonary HTN, seizure, memory loss, history of TIA.   Acute on chronic respiratory failure with hypoxia and hypercapnia (HCC) 2/2 to pneumonia. Patient on 3L [place] overnight. Started 5 day course of abx and steroids yesterday. Follows with pulmonology outpatient. Palliative care rec continue treat the treatable, confirmed DNR/DNI. Family meeting today at 2pm.  - Continue antibiotics  - Rocephin (3/12 - 3/16)  - Azithromycin (3/12 - 3/14) - Continue prednisone '40mg'$  daily (3/14-3/18) - palliative care following, appreciate recs  - Wean to home 8-10L [place] as able  - Continue DuoNebs every 6 hours - Continue home medications: Anora Ellipta, Allegra, Atrovent nasal spray - low threshold to consult pulmonology if respiratory status worsens   Anemia Noted normocytic anemia. Appears to have been anemic even last month with Hgb ~10-10.9 -continue to monitor  Chronic diastolic CHF (congestive heart failure) (HCC) Cr 1.1 and K stable this AM. Output 2.8L yesterday. Last weight 77.8kg.  Discharge weight ~73 kg.  -restart lasix 40 QOD and close follow-up re lasix regimen -daily weights    FEN/GI: regular with 1867m fluid restriction PPx: lovenox Dispo: Home, barriers include clinical improvement   Subjective:  NAEO. Patient seen lying in bed with O2 3L [place] in place. He  denies any new SOB or pain. He wonders about when he is going to get his medications including his lasix and antibiotics. He is aware of palliative meeting later today and wonders about cost of outpatient palliative. He also wonders about discharge.   Objective: Temp:  [97.7 F (36.5 C)-98.2 F (36.8 C)] 98.1 F (36.7 C) (03/14 0745) Pulse Rate:  [50-79] 54 (03/14 0745) Resp:  [18-21] 19 (03/14 0745) BP: (98-136)/(46-80) 109/68 (03/14 0745) SpO2:  [91 %-100 %] 94 % (03/14 0850) Weight:  [77.8 kg] 77.8 kg (03/13 1708)  Physical Exam: General: alert, in no acute distress Cardiovascular: regular rate and rhythm. No m/r/g. Respiratory: prolonged expiratory phase. Diminished breath sounds. No wheezing.  Abdomen: soft, non-tender, non-distended.  Extremities: no pitting edema noted.   Laboratory: Most recent CBC Lab Results  Component Value Date   WBC 12.4 (H) 10/29/2022   HGB 10.2 (L) 10/29/2022   HCT 33.0 (L) 10/29/2022   MCV 86.6 10/29/2022   PLT 231 10/29/2022   Most recent BMP    Latest Ref Rng & Units 10/29/2022   12:39 AM  BMP  Glucose 70 - 99 mg/dL 127   BUN 8 - 23 mg/dL 21   Creatinine 0.61 - 1.24 mg/dL 1.10   Sodium 135 - 145 mmol/L 140   Potassium 3.5 - 5.1 mmol/L 4.1   Chloride 98 - 111 mmol/L 107   CO2 22 - 32 mmol/L 28   Calcium 8.9 - 10.3 mg/dL 8.1     CRolanda Lundborg MD 10/29/2022, 9:32 AM  PGY-1, CKeysIntern pager: [phone] text pages welcome Secure chat group [facility]

## 2022-10-29 NOTE — Assessment & Plan Note (Addendum)
Noted normocytic anemia. Appears to have been anemic even last month with Hgb stable. -continue to monitor

## 2022-10-30 ENCOUNTER — Other Ambulatory Visit (HOSPITAL_COMMUNITY): Payer: Self-pay

## 2022-10-30 LAB — CBC
HCT: 33.8 % — ABNORMAL LOW (ref 39.0–52.0)
Hemoglobin: 10.9 g/dL — ABNORMAL LOW (ref 13.0–17.0)
MCH: 27.1 pg (ref 26.0–34.0)
MCHC: 32.2 g/dL (ref 30.0–36.0)
MCV: 84.1 fL (ref 80.0–100.0)
Platelets: 237 10*3/uL (ref 150–400)
RBC: 4.02 MIL/uL — ABNORMAL LOW (ref 4.22–5.81)
RDW: 14.4 % (ref 11.5–15.5)
WBC: 11.6 10*3/uL — ABNORMAL HIGH (ref 4.0–10.5)
nRBC: 0 % (ref 0.0–0.2)

## 2022-10-30 LAB — BASIC METABOLIC PANEL
Anion gap: 5 (ref 5–15)
BUN: 22 mg/dL (ref 8–23)
CO2: 28 mmol/L (ref 22–32)
Calcium: 8.3 mg/dL — ABNORMAL LOW (ref 8.9–10.3)
Chloride: 104 mmol/L (ref 98–111)
Creatinine, Ser: 1.03 mg/dL (ref 0.61–1.24)
GFR, Estimated: >60 mL/min (ref >60)
Glucose, Bld: 123 mg/dL — ABNORMAL HIGH (ref 70–99)
Potassium: 4.1 mmol/L (ref 3.5–5.1)
Sodium: 137 mmol/L (ref 135–145)

## 2022-10-30 LAB — MAGNESIUM: Magnesium: 2.4 mg/dL (ref 1.7–2.4)

## 2022-10-30 MED ORDER — FUROSEMIDE 40 MG PO TABS
40.0000 mg | ORAL_TABLET | ORAL | 0 refills | Status: DC
  Filled 2022-10-30: qty 30, 60d supply, fill #0

## 2022-10-30 MED ORDER — CEFDINIR 300 MG PO CAPS
300.0000 mg | ORAL_CAPSULE | Freq: Two times a day (BID) | ORAL | 0 refills | Status: AC
  Filled 2022-10-30: qty 3, 2d supply, fill #0

## 2022-10-30 MED ORDER — PREDNISONE 20 MG PO TABS
40.0000 mg | ORAL_TABLET | Freq: Every day | ORAL | 0 refills | Status: AC
  Filled 2022-10-30: qty 6, 3d supply, fill #0

## 2022-10-30 NOTE — Progress Notes (Signed)
Nurse requested Mobility Specialist to perform oxygen saturation test with pt which includes removing pt from oxygen both at rest and while ambulating.  Below are the results from that testing.     Patient Saturations on Room Air at Rest = spO2 84%  Patient Saturations on Room Air while Ambulating = sp02 n/a d/t low saturations % .    Patient Saturations on 3 Liters of oxygen while Ambulating = sp02 86% Patient Saturations on 6 Liters of oxygen while Ambulating = sp02 87% Patient Saturations on 8 Liters of oxygen while Ambulating = sp02 95%  At end of testing pt left in room on 3  Liters of oxygen. (91%)  Reported results to nurse.

## 2022-10-30 NOTE — Plan of Care (Signed)
  Problem: Nutrition: Goal: Adequate nutrition will be maintained Outcome: Completed/Met   Problem: Elimination: Goal: Will not experience complications related to bowel motility Outcome: Completed/Met Goal: Will not experience complications related to urinary retention Outcome: Completed/Met   Problem: Pain Managment: Goal: General experience of comfort will improve Outcome: Completed/Met   

## 2022-10-30 NOTE — Progress Notes (Signed)
     Daily Progress Note Intern Pager: [phone]  Patient name: Frank Meyer Medical record number: [id number] Date of birth: 1940-12-22 Age: 82 y.o. Gender: male  Primary Care Provider: Burnard Bunting, MD Consultants: none Code Status: DNR/DNI  Pt Overview and Major Events to Date:  3/12 - admitted   Assessment and Plan: Frank Meyer is a 82 y.o. male presenting with acute hypoxic respiratory failure due to pneumonia. Patient is severely ill and does not have a good prognosis.     Pertinent PMH/PSH includes chronic respiratory failure, pan lobar emphysema, ex cigarette smoker, chronic diastolic heart failure, pulmonary HTN, seizure, memory loss, history of TIA.   Acute on chronic respiratory failure with hypoxia and hypercapnia (HCC) 2/2 to pneumonia. Patient on 3L [place] overnight. Started 5 day course of abx and steroids yesterday. Follows with pulmonology outpatient. Palliative care rec continue treat the treatable, confirmed DNR/DNI. Family meeting today at 2pm. De-escalated antibiotics to PO. Ambulate with pulse ox tomorrow. - Continue antibiotics  - Rocephin (3/12 - 3/14), cefdinir 300 Q12H (3/15-3/17)  - Azithromycin (3/12 - 3/14) - Continue prednisone 40mg  daily (3/14-3/18) - palliative care following, appreciate recs  - Wean to home 8-10L [place] as able  - Continue DuoNebs every 6 hours - Continue home medications: Anora Ellipta, Allegra, Atrovent nasal spray - low threshold to consult pulmonology if respiratory status worsens   Anemia Noted normocytic anemia. Appears to have been anemic even last month with Hgb ~10-10.9 -continue to monitor  Chronic diastolic CHF (congestive heart failure) (HCC) Cr 1.1 and K stable this AM. Output 2.8L yesterday. Last weight 77.8kg.  Discharge weight ~73 kg.  -restart lasix 40 QOD and close follow-up re lasix regimen -daily weights    FEN/GI: regular PPx: lovenox Dispo: Home, barrier includes ambulate with pulse ox     Subjective:  NAEO. Denies any SOB, chest pain. Reports feeling ready to go home. Is unsure about being on hospice but is open to following up with palliative care.   Objective: Temp:  [97.6 F (36.4 C)-98.1 F (36.7 C)] 98 F (36.7 C) (03/15 0458) Pulse Rate:  [54-87] 59 (03/15 0458) Resp:  [19-20] 20 (03/15 0458) BP: (109-118)/(57-74) 118/63 (03/15 0458) SpO2:  [90 %-100 %] 97 % (03/15 0458) Weight:  [75.3 kg] 75.3 kg (03/15 0458)  Physical Exam: General: alert, well-appearing, in no acute distress Cardiovascular: regular rate and rhythm. Respiratory: prolonged expiratory phase, no wheezing.  Abdomen: soft, non-tender, non-distended Extremities: no peripheral edema   Laboratory: Most recent CBC Lab Results  Component Value Date   WBC 11.6 (H) 10/30/2022   HGB 10.9 (L) 10/30/2022   HCT 33.8 (L) 10/30/2022   MCV 84.1 10/30/2022   PLT 237 10/30/2022   Most recent BMP    Latest Ref Rng & Units 10/30/2022   12:18 AM  BMP  Glucose 70 - 99 mg/dL 123   BUN 8 - 23 mg/dL 22   Creatinine 0.61 - 1.24 mg/dL 1.03   Sodium 135 - 145 mmol/L 137   Potassium 3.5 - 5.1 mmol/L 4.1   Chloride 98 - 111 mmol/L 104   CO2 22 - 32 mmol/L 28   Calcium 8.9 - 10.3 mg/dL 8.3     Rolanda Lundborg, MD 10/30/2022, 6:50 AM  PGY-1, [place] Intern pager: [phone], text pages welcome Secure chat group [place]

## 2022-10-30 NOTE — Progress Notes (Deleted)
  Patient Saturations on Room Air at Rest = 87%  Patient Saturations on [employer] while Ambulating = 84%  Patient Saturations on 3 Liters of oxygen while Ambulating = 90%  Irven [place], RN

## 2022-10-30 NOTE — Discharge Summary (Addendum)
[facility] Discharge Summary  Patient name: Frank Meyer Medical record number: [id number] Date of birth: 1940/09/19 Age: 82 y.o. Gender: male Date of Admission: 10/27/2022  Date of Discharge: 10/29/21 Admitting Physician: Dickie La, MD  Primary Care Provider: Burnard Bunting, MD Consultants: None  Indication for Hospitalization: Acute hypoxic respiratory failure   Brief Hospital Course:  Frank Meyer is a 82 y.o.male with a history of chronic respiratory failure, pan-lobar emphysema, former tobacco use, chronic diastolic heart failure, pulmonary HTN, seizure, memory loss, history of TIA who was admitted to the [facility] Medicine Teaching Service at [facility] for acute hypoxic respiratory failure due to pneumonia. His hospital course is detailed below:  Acute hypoxic respiratory failure ([place]) Suspect secondary to RUL CAP pneumonia. CXR showed chronic scarring, no new or acute cardiopulmonary findings. CTA with severe pulmonary emphysema, concern for RUL PNA. No evidence of PE. Patient was weaned to 3L [place], less than his home 8-10L of O2. He appeared hypovolemic on admission so he was given 2L fluid bolus. For CAP, he was given rocephin (3/12-3/14) and azithromycin (3/12-3/14). He was de-escalated to cefdinir for 3/15 with plan to complete course on 3/16. He was also started on prednisone 40mg  daily (3/14-3/18). He follows with pulmonology outpatient (Dr. Silas Flood) and was restarted on home medications of Anora Ellipta, Allegra, Atrovent nasal spray. He was continued on duonebs Q6H. Palliative care was also consulted and patient's goals were to treat the treatable and continue DNR/DNI. He was open to outpatient palliative care support. He ambulated to 95% with 8L of O2 prior to discharge.  Chronic diastolic CHF (congestive heart failure) (HCC) BNP 125.4 on admission. On lasix 40mg  daily at home per med rec and prior admission discharge summary but patient  reports his lasix dosing has been continually modified after each visit with cardiology, pulmonology, and his PCP. Cr normalized during this admission. Seen by cardiology outpatient who recommended reducing lasix to 20mg  daily. Suspected acute on chronic diastolic HF as cause of SOB during 08/2022 admission. Last discharge weight ~73 kg and weight on admission was 76kg. He was restarted on lasix 40 QOD.   Stable chronic conditions: Insomnia: Continued melatonin and trazodone nightly GERD: Continued Prilosec 40 mg HLD: Continued Lipitor 20 mg Seizures continued Keppra 250 mg a.m., 500 mg nightly BPH: Continued finasteride 5 mg Memory loss: Continue donepezil 10 mg  Other chronic conditions were medically managed with home medications and formulary alternatives as necessary  PCP Follow-up Recommendations: Follow-up chest CT 3 months Close follow up to monitor volume status on Lasix 40mg  every other day 3.   Follow-up anemia outpatient  Discharge Diagnoses/Problem List:  -Acute on chronic respiratory failure with hypoxia and hypercapnia (HCC) -Anemia -Chronic diastolic CHF (congestive heart failure) (HCC)   Disposition: Home   Discharge Condition: Stable  Discharge Exam:  GEN: well-appearing, on 3L HFNC in no acute distress CARDIO: RRR. No m/r/g. RESP: prolonged expiratory phase and diminished lung sounds throughout. No wheezing.  ABD: soft, non-tender, non-distended NEURO: alert and answering questions appropriately   Significant Procedures: None  Significant Labs and Imaging:  Recent Labs  Lab 10/29/22 0039 10/30/22 0018  WBC 12.4* 11.6*  HGB 10.2* 10.9*  HCT 33.0* 33.8*  PLT 231 237   Recent Labs  Lab 10/29/22 0039 10/30/22 0018  NA 140 137  K 4.1 4.1  CL 107 104  CO2 28 28  GLUCOSE 127* 123*  BUN 21 22  CREATININE 1.10 1.03  CALCIUM 8.1* 8.3*  MG  --  2.4   CT Angio Chest PE W and/or Wo Contrast  Result Date: 10/27/2022 CLINICAL DATA:  Pulmonary embolism  (PE) suspected, high prob EXAM: CT ANGIOGRAPHY CHEST WITH CONTRAST TECHNIQUE: Multidetector CT imaging of the chest was performed using the standard protocol during bolus administration of intravenous contrast. Multiplanar CT image reconstructions and MIPs were obtained to evaluate the vascular anatomy. RADIATION DOSE REDUCTION: This exam was performed according to the departmental dose-optimization program which includes automated exposure control, adjustment of the mA and/or kV according to patient size and/or use of iterative reconstruction technique. CONTRAST:  73mL OMNIPAQUE IOHEXOL 350 MG/ML SOLN COMPARISON:  Chest CT 09/23/2022, CT 01/24/2019. FINDINGS: Cardiovascular: Satisfactory opacification of the pulmonary arteries to the segmental level. No evidence of pulmonary embolism. Unchanged cardiomegaly.Trace pericardial effusion. Coronary artery atherosclerosis. Mild atherosclerosis of the thoracic aorta. Mediastinum/Nodes: There are multiple prominent mediastinal lymph nodes, for reference a right paratracheal lymph node measuring 1.1 cm (series 5, image 43), and a precarinal lymph node measuring up to 1.2 cm (series 5, image 57). This previously measured 0.8 and 0.9 cm respectively. The thyroid is unremarkable. Lungs/Pleura: Severe centrilobular and paraseptal emphysema. There is increased diffuse ground-glass and new consolidative airspace disease in the lung bases and peripheral posterior aspect of the right upper lobe. Similar 1.2 x 1.2 cm nodular opacity in the right apex with architectural distortion. Adjacent apical consolidation and bronchiectasis, similar to prior exams dating back to at least June 2020. Right apical nodular areas have been waxing waning over multiple prior exams over this time interval. Trace pleural effusions. No evidence of pneumothorax. Upper Abdomen: Postsurgical changes in the upper abdomen. No acute findings. Musculoskeletal: No acute osseous abnormality. No suspicious osseous  lesion. T2 vertebral body hemangioma. Benign bone island in T3. No aggressive osseous lesion. Review of the MIP images confirms the above findings. IMPRESSION: No evidence of pulmonary embolism. Severe pulmonary emphysema. Increased diffuse ground-glass and new consolidative airspace disease in the lung bases and peripheral posterior aspect of the right upper lobe, concerning for pneumonia. Trace pleural effusions and trace pericardial effusion. Recommend follow-up chest CT in 3 months. Unchanged right apical pleuroparenchymal scarring with waxing and waning nodular features dating back to at least June 2020. Multiple prominent mediastinal lymph nodes, favored to be reactive. Recommend attention on follow-up. Electronically Signed   By: Maurine Simmering M.D.   On: 10/27/2022 13:37   DG Chest Portable 1 View  Result Date: 10/27/2022 CLINICAL DATA:  Shortness of breath. EXAM: PORTABLE CHEST 1 VIEW COMPARISON:  09/15/2022 FINDINGS: Advanced changes of emphysema. Architectural distortion/scarring in the right upper and lower lung as well as left base is similar to prior. No evidence for pulmonary edema or pleural effusion. Cardiopericardial silhouette is at upper limits of normal for size. The visualized bony structures of the thorax are unremarkable. Telemetry leads overlie the chest. IMPRESSION: Chronic architectural distortion/scarring in both lungs, as before. No new or acute cardiopulmonary findings. Electronically Signed   By: Misty Stanley M.D.   On: 10/27/2022 11:25     Results/Tests Pending at Time of Discharge: None  Discharge Medications:  Allergies as of 10/30/2022   No Known Allergies      Medication List     STOP taking these medications    azithromycin 250 MG tablet Commonly known as: Zithromax       TAKE these medications    Anoro Ellipta 62.5-25 MCG/ACT Aepb Generic drug: umeclidinium-vilanterol USE 1 INHALATION BY MOUTH DAILY   aspirin EC  81 MG tablet Take 81 mg by mouth  daily.   atorvastatin 20 MG tablet Commonly known as: LIPITOR Take 20 mg by mouth every evening.   cefdinir 300 MG capsule Commonly known as: OMNICEF Take 1 capsule (300 mg total) by mouth every 12 (twelve) hours for 2 days.   cholecalciferol 1000 units tablet Commonly known as: VITAMIN D Take 1,000 Units by mouth daily.   donepezil 10 MG tablet Commonly known as: ARICEPT Take 1 tablet (10 mg total) by mouth daily.   Entresto 24-26 MG Generic drug: sacubitril-valsartan TAKE 1 TABLET BY MOUTH DAILY   fexofenadine 180 MG tablet Commonly known as: ALLEGRA Take 180 mg by mouth daily.   finasteride 5 MG tablet Commonly known as: PROSCAR Take 5 mg by mouth at bedtime.   furosemide 40 MG tablet Commonly known as: LASIX Take 1 tablet (40 mg total) by mouth every other day. Start taking on: October 31, 2022 What changed:  medication strength how much to take when to take this   ipratropium 0.06 % nasal spray Commonly known as: ATROVENT Place 1-2 sprays into both nostrils See admin instructions. 2 sprays in the right nostril and 1 spray in the left nostril once daily   ipratropium-albuterol 0.5-2.5 (3) MG/3ML Soln Commonly known as: DUONEB Take 3 mLs by nebulization 2 (two) times daily.   levETIRAcetam 250 MG tablet Commonly known as: Keppra Take 1-2 tablets (250-500 mg total) by mouth See admin instructions. Take 250 mg by mouth in the morning and 500 mg by mouth at night. What changed: additional instructions   Melatonin 10 MG Tabs Take 20 mg by mouth at bedtime.   nitroGLYCERIN 0.4 MG SL tablet Commonly known as: NITROSTAT Place 1 tablet (0.4 mg total) under the tongue every 5 (five) minutes as needed. Chest pain What changed: reasons to take this   omeprazole 40 MG capsule Commonly known as: PRILOSEC Take 40 mg by mouth daily.   OXYGEN Inhale 2-5 L into the lungs as directed. 5lt during the day and 2lt at night   predniSONE 20 MG tablet Commonly known as:  DELTASONE Take 2 tablets (40 mg total) by mouth daily with breakfast for 3 days.   thiamine 100 MG tablet Commonly known as: VITAMIN B1 Take 1 tablet (100 mg total) by mouth daily.   traZODone 100 MG tablet Commonly known as: DESYREL TAKE 2 TABLETS BY MOUTH AT  BEDTIME        Discharge Instructions: Please refer to Patient Instructions section of EMR for full details.  Patient was counseled important signs and symptoms that should prompt return to medical care, changes in medications, dietary instructions, activity restrictions, and follow up appointments.   Follow-Up Appointments:  Follow-up Information     Burnard Bunting, MD. Call in 3 day(s).   Specialty: Internal Medicine Why: Please follow up in a week. Contact information: [address] [place] [address] [place] Follow up.   Specialty: PALLIATIVE CARE Why: outpatient palliative services Contact information: [address] [place] [phone]                Rolanda Lundborg, MD 10/30/2022, 1:16 PM PGY-1, [place]

## 2022-10-30 NOTE — Discharge Instructions (Addendum)
Dear Frank Meyer,   Thank you for letting us participate in your care! In this section, you will find a brief hospital admission summary of why you were admitted to the hospital and post-hospital plan.  You were admitted because you were experiencing increased difficulty with breathing and requiring more oxygen.  And you were found to have pneumonia and was treated with steroid and antibiotics.  Please call your primary care doctor in 1-3 days to schedule a close hospital follow-up appointment soon. [phone]    POST-HOSPITAL & CARE INSTRUCTIONS Needs repeat chest CT in 3 months Please let PCP/Specialists know of any changes in medications that were made.  Please see medications section of this packet for any medication changes.   DOCTOR'S APPOINTMENTS & FOLLOW UP Future Appointments  Date Time Provider [place]  11/03/2022 10:15 AM [facility] UNDERGRAD [facility] None  11/05/2022 10:15 AM [facility] UNDERGRAD [facility] None  11/10/2022 10:15 AM [facility] UNDERGRAD [facility] None  11/11/2022  9:30 AM Bernarda Caffey, MD TRE-TRE None  11/12/2022 10:15 AM [facility] UNDERGRAD [facility] None  11/17/2022 10:15 AM [facility] UNDERGRAD [facility]RE[facility] None  11/19/2022 10:15 AM [facility] UNDERGRAD [facility]RE[facility] None  11/24/2022 10:15 AM [facility] UNDERGRAD [facility] None  11/26/2022 10:15 AM [facility] UNDERGRAD [facility] None  12/01/2022 10:15 AM [facility] RE[facility] UNDERGRAD [facility]RE[facility] None  12/03/2022 10:15 AM [facility] UNDERGRAD [facility]RE[facility] None  12/08/2022 10:15 AM [facility] UNDERGRAD [facility] None  12/10/2022 10:15 AM [facility] UNDERGRAD [facility] None  12/15/2022 10:15 AM [facility] UNDERGRAD [facility] None  12/15/2022  2:00 PM Park Liter, MD [facility] None  12/17/2022 10:15 AM [facility] UNDERGRAD [facility]RE[facility] None  12/22/2022 10:15 AM [facility] UNDERGRAD [facility] None  12/24/2022 10:15 AM  [facility] UNDERGRAD [facility] None  12/29/2022 10:15 AM [facility] UNDERGRAD [facility] None  12/31/2022 10:15 AM [facility] REH[facility] UNDERGRAD [facility]RE[facility] None  01/05/2023 10:15 AM [facility] REHAB UNDERGRAD [facility]RE[facility] None  01/07/2023 10:15 AM [facility] REHAB UNDERGRAD [facility]RE[facility] None  07/22/2023 11:15 AM Alric Ran, MD GNA-GNA None     Thank you for choosing [facility]! Take care and be well!  [facility]  [place], [place] [address] [phone]

## 2022-10-30 NOTE — Progress Notes (Signed)
Explained discharge instructions to patient. Reviewed follow up appointment and next medication administration times. Also reviewed education. Patient verbalized having an understanding for instructions given. All belongings are in the patient's possession to include TOC meds. IV and telemetry were removed. CCMD was notified. No other needs verbalized. Transported downstairs for discharge. 

## 2022-10-30 NOTE — TOC Initial Note (Signed)
Transition of Care [facility]) - Initial/Assessment Note    Patient Details  Name: Frank Meyer MRN: [id number] Date of Birth: [birth year]  [facility]) CM/SW Contact:    Zenon Mayo, RN Phone Number: 10/30/2022, 1:45 PM  Clinical Narrative:                 From home with wife, he has home oxygen with [employer].  Wife will transport him home, he is active with outpatient pulmonary rehab at this time and will resume at discharge.  [employer] rep brought up a oxygen tank and the rotator to let them 10 liters of oxygen thru on the tank for patient [facility]. She explained to patient how to use it as well.   Expected Discharge Plan: Home/Self Care Barriers to Discharge: No Barriers Identified   Patient Goals and CMS Choice Patient states their goals for this hospitalization and ongoing recovery are:: return home   Choice offered to / list presented to : NA      Expected Discharge Plan and Services   Discharge Planning Services: CM Consult Post Acute Care Choice: NA Living arrangements for the past 2 months: Single Family Home Expected Discharge Date: 10/30/22               DME Arranged: N/A         HH Arranged: NA          Prior Living Arrangements/Services Living arrangements for the past 2 months: Single Family Home Lives with:: Spouse Patient language and need for interpreter reviewed:: Yes Do you feel safe going back to the place where you live?: Yes      Need for Family Participation in Patient Care: Yes (Comment) Care giver support system in place?: Yes (comment) Current home services: DME (home oxygen ([employer])) Criminal Activity/Legal Involvement Pertinent to Current Situation/Hospitalization: No - Comment as needed  Activities of Daily Living Home Assistive Devices/Equipment: Eyeglasses (for reading) ADL Screening (condition at time of admission) Patient's cognitive ability adequate to safely complete daily activities?: Yes Is the  patient deaf or have difficulty hearing?: No Does the patient have difficulty seeing, even when wearing glasses/contacts?: No Does the patient have difficulty concentrating, remembering, or making decisions?: Yes Patient able to express need for assistance with ADLs?: Yes Does the patient have difficulty dressing or bathing?: No Independently performs ADLs?: Yes (appropriate for developmental age) Does the patient have difficulty walking or climbing stairs?: Yes Weakness of Legs: None Weakness of Arms/Hands: None  Permission Sought/Granted                  Emotional Assessment   Attitude/Demeanor/Rapport: Engaged Affect (typically observed): Appropriate Orientation: : Oriented to Self, Oriented to Place, Oriented to  Time, Oriented to Situation Alcohol / Substance Use: Not Applicable Psych Involvement: No (comment)  Admission diagnosis:  Community acquired bacterial pneumonia [J15.9] COPD exacerbation ([place]) [J44.1] Pneumonia of right upper lobe due to infectious organism [J18.9] Pneumonia of both lower lobes due to infectious organism [J18.9] Acute hypoxic respiratory failure ([place]) [J96.01] Patient Active Problem List   Diagnosis Date Noted   Anemia 10/29/2022   Mild cognitive impairment of uncertain or unknown etiology 10/28/2022   Chronic obstructive pulmonary disease ([place]) 10/28/2022   Acute on chronic respiratory failure with hypoxia and hypercapnia ([place]) 10/27/2022   Community acquired bacterial pneumonia 08/03/2022   Dysphagia 07/21/2022   GERD (gastroesophageal reflux disease) 07/21/2022   COPD exacerbation ([place]) 07/20/2022   Seizures ([place]) 02/03/2022  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[place]) 06/24/2021   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   Chronic respiratory failure with hypoxia and hypercapnia ([place]) 10/04/2020   Oxygen dependent 10/04/2020   Weight decreased  10/04/2020   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   Pulmonary hypertension (HCC)    Macular pucker, right eye 04/30/2020   Exudative age-related macular degeneration, left eye, with active choroidal neovascularization ([place]) 12/18/2019   Nuclear sclerotic cataract of right eye 12/18/2019   Posterior vitreous detachment of right eye 12/18/2019   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   Chronic diastolic CHF (congestive heart failure) ([place]) 12/19/2018   Mediastinal adenopathy 03/25/2018   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   Dyslipidemia 03/18/2018   History of smoking 03/18/2018   Heart disease 01/23/2015   Insomnia 05/10/2012   Barrett's esophagus 10/24/2009   Hyperlipidemia 10/24/2009   BPH (benign prostatic hyperplasia) 10/24/2009   Allergic rhinitis 10/24/2009   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   PCP:  Burnard Bunting, MD Pharmacy:   [id number] - [place], [place] - [address] AT [place] [address] [place] [address] Phone: [phone] Fax: [phone]  [facility] ([place], [place] [facility] [address] [place] [address] Phone: [phone] Fax: [place], [place] [address] Phone: [phone] Fax: [phone]     Social Determinants of Health (SDOH) Social History: [place]: No Food Insecurity (10/28/2022)  Housing: Low Risk  (10/28/2022)  Transportation Needs: No Transportation Needs (10/28/2022)  Utilities: Not At Risk (10/28/2022)  Depression (PHQ2-9): Low Risk  (10/09/2022)  Tobacco Use: Medium Risk (10/28/2022)   SDOH Interventions:     Readmission Risk Interventions    10/30/2022   12:53 PM  Readmission Risk Prevention Plan  Transportation Screening  Complete  PCP or Specialist Appt within 3-5 Days Complete  HRI or Home Care Consult Complete  Palliative Care Screening Not Applicable  Medication Review (RN Care Manager) Complete

## 2022-10-30 NOTE — Care Management Important Message (Signed)
Important Message  Patient Details  Name: Frank Meyer MRN: [id number] Date of Birth: March 22, 1941   Medicare Important Message Given:  Yes     Shelda Altes 10/30/2022, 8:14 AM

## 2022-10-30 NOTE — TOC Transition Note (Signed)
Transition of Care [facility]) - CM/SW Discharge Note   Patient Details  Name: Frank Meyer MRN: [id number] Date of Birth: 1941/04/18  [facility]) CM/SW Contact:  Zenon Mayo, RN Phone Number: 10/30/2022, 1:47 PM   Clinical Narrative:    From home with wife, he has home oxygen with [employer].  Wife will transport him home, he is active with outpatient pulmonary rehab at this time and will resume at discharge.  [employer] rep brought up a oxygen tank and the rotator to let them 10 liters of oxygen thru on the tank for patient when [facility]. She explained to patient how to use it as well.   Final next level of care: Home/Self Care Barriers to Discharge: No Barriers Identified   Patient Goals and CMS Choice   Choice offered to / list presented to : NA  Discharge Placement                         Discharge Plan and Services Additional resources added to the After Visit Summary for     Discharge Planning Services: CM Consult Post Acute Care Choice: NA          DME Arranged: N/A         HH Arranged: NA          Social Determinants of Health (SDOH) Interventions SDOH Screenings   Food Insecurity: No Food Insecurity (10/28/2022)  Housing: [place]  (10/28/2022)  Transportation Needs: No Transportation Needs (10/28/2022)  Utilities: Not At Risk (10/28/2022)  Depression (PHQ2-9): Low Risk  (10/09/2022)  Tobacco Use: Medium Risk (10/28/2022)     Readmission Risk Interventions    10/30/2022   12:53 PM  Readmission Risk Prevention Plan  Transportation Screening Complete  PCP or Specialist Appt within 3-5 Days Complete  HRI or [place] Complete  Palliative Care Screening Not Applicable  Medication Review (RN Care Manager) Complete

## 2022-10-30 NOTE — Progress Notes (Signed)
Mobility Specialist Progress Note:   10/30/22 1130  Mobility  Activity Ambulated with assistance in hallway  Level of Assistance Modified independent, requires aide device or extra time  Assistive Device None  Distance Ambulated (ft) 450 ft  Activity Response Tolerated well  $Mobility charge 1 Mobility   Pt in chair willing to participate in mobility. No complaint of pain. Left in bed with call bell in reach and all needs met.   [name] [name] Mobility Specialist Please contact via [employer] or  Rehab Office at [phone]

## 2022-10-30 NOTE — Plan of Care (Signed)
  Problem: Education: Goal: Knowledge of General Education information will improve Description: Including pain rating scale, medication(s)/side effects and non-pharmacologic comfort measures Outcome: Adequate for Discharge   Problem: Health Behavior/Discharge Planning: Goal: Ability to manage health-related needs will improve Outcome: Adequate for Discharge   Problem: Clinical Measurements: Goal: Ability to maintain clinical measurements within normal limits will improve Outcome: Adequate for Discharge Goal: Will remain free from infection Outcome: Adequate for Discharge Goal: Diagnostic test results will improve Outcome: Adequate for Discharge Goal: Respiratory complications will improve Outcome: Adequate for Discharge Goal: Cardiovascular complication will be avoided Outcome: Adequate for Discharge   Problem: Activity: Goal: Risk for activity intolerance will decrease Outcome: Adequate for Discharge   Problem: Coping: Goal: Level of anxiety will decrease Outcome: Adequate for Discharge   Problem: Safety: Goal: Ability to remain free from injury will improve Outcome: Adequate for Discharge   Problem: Skin Integrity: Goal: Risk for impaired skin integrity will decrease Outcome: Adequate for Discharge

## 2022-11-01 LAB — CULTURE, BLOOD (ROUTINE X 2)
Culture: NO GROWTH
Culture: NO GROWTH
Special Requests: ADEQUATE

## 2022-11-02 ENCOUNTER — Encounter (INDEPENDENT_AMBULATORY_CARE_PROVIDER_SITE_OTHER): Payer: Medicare Other | Admitting: Ophthalmology

## 2022-11-02 ENCOUNTER — Telehealth: Payer: Self-pay | Admitting: Pulmonary Disease

## 2022-11-02 DIAGNOSIS — H25811 Combined forms of age-related cataract, right eye: Secondary | ICD-10-CM

## 2022-11-02 DIAGNOSIS — H353231 Exudative age-related macular degeneration, bilateral, with active choroidal neovascularization: Secondary | ICD-10-CM

## 2022-11-02 DIAGNOSIS — Z961 Presence of intraocular lens: Secondary | ICD-10-CM

## 2022-11-02 NOTE — Telephone Encounter (Signed)
Pt was just released from hospital. States he was to have an RX submitted for and Oximizer pendant that rehab recommended but they delivered another "moustache" which he refused. [employer] has nothing on order for him for the [place].  Also, he was sent home with 12 LT of hospital of O2 that he was told [employer] would pick up this  tank up and they would drop off 2-D10 tanks that could be refilled by his home concentrator. He fears they may deliver ones that can NOT be refilled. . They also have nothing at [employer] for him.   Pls call to advise. His # is [phone]

## 2022-11-03 ENCOUNTER — Encounter (HOSPITAL_COMMUNITY)
Admission: RE | Admit: 2022-11-03 | Discharge: 2022-11-03 | Disposition: A | Discharge status: Home / Self Care | Payer: Medicare Other | Source: Ambulatory Visit | Attending: Pulmonary Disease | Admitting: Pulmonary Disease

## 2022-11-03 VITALS — Wt 168.7 lb

## 2022-11-03 DIAGNOSIS — I5032 Chronic diastolic (congestive) heart failure: Secondary | ICD-10-CM | POA: Diagnosis present

## 2022-11-03 DIAGNOSIS — Z5189 Encounter for other specified aftercare: Secondary | ICD-10-CM | POA: Diagnosis not present

## 2022-11-03 DIAGNOSIS — J439 Emphysema, unspecified: Secondary | ICD-10-CM | POA: Diagnosis not present

## 2022-11-03 NOTE — Telephone Encounter (Signed)
Message sent to [employer] to clarify oxygen orders along with the Oxymizer.

## 2022-11-03 NOTE — Telephone Encounter (Signed)
Called and spoke to [place] and from [place] and she states that they have the order for the oxygen and the oxymizer. And that they will call and reach out to the patient to let him know that the new tanks are refillable.   Called patient and gave him the update and he voiced understanding. Nothing further needed

## 2022-11-03 NOTE — Progress Notes (Signed)
Daily Session Note  Patient Details  Name: Frank Meyer MRN: [id number] Date of Birth: 1941/01/20 Referring Provider:   April Manson Pulmonary Rehab Walk Test from 10/09/2022 in [facility] [place]  Referring Provider [place]       Encounter Date: 11/03/2022  Check In:  Session Check In - 11/03/22 1218       Check-In   Supervising physician immediately available to respond to emergencies [facility] MD immediately available    Physician(s) Eric Form, NP    Location [facility]    Staff Present Janine Ores, RN, Quentin Ore, MS, ACSM-CEP, Exercise Physiologist;Other;Samantha [place], [place], LDN;Carlette Wilber Oliphant, RN, BSN    Virtual Visit No    Medication changes reported     No    Fall or balance concerns reported    No    Tobacco Cessation No Change    Warm-up and Cool-down Performed as group-led instruction    Resistance Training Performed Yes    VAD Patient? No    PAD/SET Patient? No      Pain Assessment   Currently in Pain? No/denies    Pain Score 0-No pain    Multiple Pain Sites No             Capillary Blood Glucose: No results found for this or any previous visit (from the past 24 hour(s)).   Exercise Prescription Changes - 11/03/22 1200       Response to Exercise   Blood Pressure (Admit) 98/60    Blood Pressure (Exercise) 118/58    Blood Pressure (Exit) 92/56    Heart Rate (Admit) 110 bpm    Heart Rate (Exercise) 108 bpm    Heart Rate (Exit) 85 bpm    Oxygen Saturation (Admit) 91 %    Oxygen Saturation (Exercise) 89 %    Oxygen Saturation (Exit) 99 %    Rating of Perceived Exertion (Exercise) 13    Perceived Dyspnea (Exercise) 1    Duration Progress to 30 minutes of  aerobic without signs/symptoms of physical distress    Intensity THRR unchanged      Progression   Progression Continue to progress workloads to maintain intensity without signs/symptoms of physical distress.       Resistance Training   Training Prescription Yes    Weight red bands    Reps 10-15    Time 10 Minutes      Oxygen   Oxygen Continuous    Liters 8      Recumbant Bike   Level 1    Minutes 15    METs 1.8      NuStep   Level 3    SPM 80    Minutes 15    METs 2.2      Oxygen   Maintain Oxygen Saturation 88% or higher             Social History   Tobacco Use  Smoking Status Former   Packs/day: 2.00   Years: 25.00   Additional pack years: 0.00   Total pack years: 50.00   Types: Cigarettes   Quit date: 74   Years since quitting: 30.2  Smokeless Tobacco Never    Goals Met:  Proper associated with RPD/PD & O2 Sat Exercise tolerated well No report of concerns or symptoms today Strength training completed today  Goals Unmet:  Not Applicable  Comments: Service time is from 1004 to 1145.    Dr. Rodman Pickle is  Medical Director for Pulmonary Rehab at [facility].

## 2022-11-03 NOTE — Telephone Encounter (Signed)
PT is calling again. Not called back as requested in my tel encounter so he unsure his issue had been addressed.  Pls call PT back to advise what was done and what he can expect in the future as we work toward a resolution for him. Also, he wanted to ask for a carrier for his O2 bottles. A cart with wheels, he said.  Thank you. I know you guys have so much on your plate with all these calls and I appreciate you.   Pls call to advise. His # is [phone]

## 2022-11-05 ENCOUNTER — Encounter (HOSPITAL_COMMUNITY)
Admission: RE | Admit: 2022-11-05 | Discharge: 2022-11-05 | Disposition: A | Discharge status: Home / Self Care | Payer: Medicare Other | Source: Ambulatory Visit | Attending: Pulmonary Disease | Admitting: Pulmonary Disease

## 2022-11-05 DIAGNOSIS — I5032 Chronic diastolic (congestive) heart failure: Secondary | ICD-10-CM

## 2022-11-05 DIAGNOSIS — Z5189 Encounter for other specified aftercare: Secondary | ICD-10-CM | POA: Diagnosis not present

## 2022-11-05 NOTE — Progress Notes (Signed)
[facility] Note  11/11/2022     CHIEF COMPLAINT Patient presents for Retina Follow Up   HISTORY OF PRESENT ILLNESS: Frank Meyer is a 82 y.o. male who presents to the clinic today for:   HPI     Retina Follow Up   Patient presents with  Wet AMD.  In both eyes.  This started 9 weeks ago.  Duration of 9 weeks.  Since onset it is stable.  I, the attending physician,  performed the HPI with the patient and updated documentation appropriately.        Comments   9 week retina follow up ARMD OU and IVA OS pt is reporting no vision changes noticed he denies any flashes but seen few floaters pt is late for this appointment due to being in hospital for COPD       Last edited by Bernarda Caffey, MD on 11/11/2022 12:45 PM.     Referring physician: Burnard Bunting, [place],  [place] [address]  HISTORICAL INFORMATION:   Selected notes from the [name] pt, transferring care due to insurance LEE: 09.12.23 -- IVA OU Ocular Hx- PMH-    CURRENT MEDICATIONS: No current outpatient medications on file. (Ophthalmic Drugs)   No current facility-administered medications for this visit. (Ophthalmic Drugs)   Current Outpatient Medications (Other)  Medication Sig   ANORO ELLIPTA 62.5-25 MCG/ACT AEPB USE 1 INHALATION BY MOUTH DAILY   aspirin 81 MG EC tablet Take 81 mg by mouth daily.   atorvastatin (LIPITOR) 20 MG tablet Take 20 mg by mouth every evening.   cholecalciferol (VITAMIN D) 1000 UNITS tablet Take 1,000 Units by mouth daily.   donepezil (ARICEPT) 10 MG tablet Take 1 tablet (10 mg total) by mouth daily.   ENTRESTO 24-26 MG TAKE 1 TABLET BY MOUTH DAILY   fexofenadine (ALLEGRA) 180 MG tablet Take 180 mg by mouth daily.   finasteride (PROSCAR) 5 MG tablet Take 5 mg by mouth at bedtime.   furosemide (LASIX) 40 MG tablet Take 1 tablet (40 mg total) by mouth every other day.   ipratropium (ATROVENT) 0.06 % nasal spray  Place 1-2 sprays into both nostrils See admin instructions. 2 sprays in the right nostril and 1 spray in the left nostril once daily   ipratropium-albuterol (DUONEB) 0.5-2.5 (3) MG/3ML SOLN Take 3 mLs by nebulization 2 (two) times daily.   levETIRAcetam (KEPPRA) 250 MG tablet Take 1-2 tablets (250-500 mg total) by mouth See admin instructions. Take 250 mg by mouth in the morning and 500 mg by mouth at night. (Patient taking differently: Take 250-500 mg by mouth See admin instructions. Take one tablet (250 mg) by mouth in the morning and two tablets (500 mg) by mouth at night.)   Melatonin 10 MG TABS Take 20 mg by mouth at bedtime.   nitroGLYCERIN (NITROSTAT) 0.4 MG SL tablet Place 1 tablet (0.4 mg total) under the tongue every 5 (five) minutes as needed. Chest pain (Patient taking differently: Place 0.4 mg under the tongue every 5 (five) minutes as needed for chest pain. Chest pain)   omeprazole (PRILOSEC) 40 MG capsule Take 40 mg by mouth daily.    OXYGEN Inhale 2-5 L into the lungs as directed. 5lt during the day and 2lt at night   thiamine 100 MG tablet Take 1 tablet (100 mg total) by mouth daily.   traZODone (DESYREL) 100 MG tablet TAKE 2 TABLETS BY MOUTH AT  BEDTIME  No current facility-administered medications for this visit. (Other)   REVIEW OF SYSTEMS: ROS   Positive for: Eyes Negative for: Constitutional, Gastrointestinal, Neurological, Skin, Genitourinary, Musculoskeletal, HENT, Endocrine, Cardiovascular, Respiratory, Psychiatric, Allergic/Imm, Heme/Lymph Last edited by Parthenia Ames, COT on 11/11/2022  9:14 AM.      ALLERGIES No Known Allergies  PAST MEDICAL HISTORY Past Medical History:  Diagnosis Date   AKI (acute kidney injury) ([place]) 08/03/2022   Allergic rhinitis 10/24/2009   Altered bowel habits 10/04/2020   Basal cell carcinoma    Benign prostatic hyperplasia with lower urinary tract symptoms 10/24/2009   Benign prostatic hypertrophy    Bradycardia 12/06/2019    Callosity 10/16/2013   Cardiomegaly 01/23/2015   Chronic diastolic heart failure ([place]) 12/19/2018   Chronic respiratory failure ([place]) 10/04/2020   Coronary artery calcification seen on CAT scan 05/28/2020   Dyslipidemia    Dyspnea on exertion 03/18/2018   Elevated troponin 08/03/2022   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/2018   Encounter for general adult medical examination without abnormal findings 01/17/2015   Epistaxis 09/18/2016   Ex-cigarette smoker 01/20/2021   Exudative age-related macular degeneration of left eye with active choroidal neovascularization ([place]) 12/18/2019   Gallbladder problem    Gastroesophageal reflux disease    Hiatal hernia    Hilar adenopathy 03/25/2018   History of seizures 08/03/2022   History of smoking 03/18/2018   History of transient ischemic attack (TIA) 10/24/2009   Hyperlipidemia 10/24/2009   Insomnia 05/09/2013   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   Internal hemorrhoid 10/04/2020   Macular degeneration    Macular pucker, right eye 04/30/2020   Mediastinal adenopathy 03/25/2018   Memory loss 05/09/2013   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   Mild memory disturbance    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   Noninfective gastroenteritis and colitis, unspecified 10/04/2020   Nuclear sclerotic cataract of right eye 12/18/2019   Other specified symptoms and signs involving the digestive system and abdomen 10/04/2020   Overweight 09/14/2018   Oxygen dependent 10/04/2020   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   Personal history of colonic polyps 10/04/2020   Posterior vitreous detachment of right eye 12/18/2019   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   Pulmonary hypertension ([place])    Right upper lobe pulmonary nodule 03/25/2018   Syncope and collapse 12/12/2018   TIA (transient ischemic attack)    Transient ischemic attack 10/24/2009   Vasomotor rhinitis  09/18/2016   Weight decreased 10/04/2020   Past Surgical History:  Procedure Laterality Date   CHOLECYSTECTOMY     none     FAMILY HISTORY Family History  Problem Relation Age of Onset   Obesity Mother    Memory loss Mother    SOCIAL HISTORY Social History   Tobacco Use   Smoking status: Former    Packs/day: 2.00    Years: 25.00    Additional pack years: 0.00    Total pack years: 50.00    Types: Cigarettes    Quit date: 1994    Years since quitting: 30.2   Smokeless tobacco: Never  Substance Use Topics   Alcohol use: Yes    Alcohol/week: 15.0 - 20.0 standard drinks of alcohol    Types: 15 - 20 Cans of beer per week   Drug use: Never       OPHTHALMIC EXAM:  Base Eye Exam     Visual Acuity (Snellen - Linear)       Right Left   Dist  Nowthen 20/30 -2 20/40 -1   Dist ph  NI NI         Tonometry (Tonopen, 9:19 AM)       Right Left   Pressure 12 11         Pupils       Pupils Dark Light Shape React APD   Right PERRL 3 2 Round Brisk None   Left PERRL 3 2 Round Brisk None         Visual Fields       Left Right    Full Full         Extraocular Movement       Right Left    Full, Ortho Full, Ortho         Neuro/Psych     Oriented x3: Yes   Mood/Affect: Normal         Dilation     Both eyes: 2.5% Phenylephrine @ 9:19 AM           Slit Lamp and Fundus Exam     External Exam       Right Left   External Normal Normal         Slit Lamp Exam       Right Left   Lids/Lashes Dermatochalasis - upper lid, Meibomian gland dysfunction Dermatochalasis - upper lid, Meibomian gland dysfunction   Conjunctiva/Sclera White and quiet White and quiet   Cornea arcus, 1-2+ Punctate epithelial erosions arcus, 1-2+ Punctate epithelial erosions, well healed cataract wound   Anterior Chamber deep and clear deep and clear   Iris Round and dilated Round and dilated   Lens 2-3+ Nuclear sclerosis with brunescence, 2-3+ Cortical cataract PC IOL in  good position   Anterior Vitreous Vitreous syneresis Vitreous syneresis, silicone oil microbubbles         Fundus Exam       Right Left   Posterior Vitreous Posterior vitreous detachment Posterior vitreous detachment   Disc Pink and Sharp Pink and Sharp   C/D Ratio 0.2 0.2   Macula Flat, Blunted foveal reflex, Drusen, RPE mottling and clumping, mild Atrophy temporal mac Flat, Blunted foveal reflex, RPE mottling, low PED/CNV with mild interval increase in overlying edema, no frank heme   Vessels attenuated, Tortuous attenuated, Tortuous   Periphery Attached, mild reticular degeneration, No heme Attached, mild reticular degeneration, No heme           IMAGING AND PROCEDURES  Imaging and Procedures for 11/11/2022  OCT, Retina - OU - Both Eyes       Right Eye Quality was borderline. Central Foveal Thickness: 311. Progression has been stable. Findings include normal foveal contour, no IRF, no SRF, retinal drusen , outer retinal atrophy (Focal ORA temporal macula, no fluid or edema).   Left Eye Quality was good. Central Foveal Thickness: 319. Progression has worsened. Findings include no IRF, no SRF, abnormal foveal contour, retinal drusen , subretinal hyper-reflective material, pigment epithelial detachment (Interval increase in [facility] nasal fovea overlying stable shallow PED).   Notes *Images captured and stored on drive  Diagnosis / Impression:  OD: Focal ORA temporal macula, no fluid or edema OS: Interval increase in [facility] nasal fovea overlying stable shallow PED  Clinical management:  See below  Abbreviations: NFP - Normal foveal profile. CME - cystoid macular edema. PED - pigment epithelial detachment. IRF - intraretinal fluid. SRF - subretinal fluid. EZ - ellipsoid zone. ERM - epiretinal membrane. ORA - outer retinal atrophy. ORT - outer retinal tubulation.  SRHM - subretinal hyper-reflective material. IRHM - intraretinal hyper-reflective material      Intravitreal  Injection, Pharmacologic Agent - OS - Left Eye       Time Out 11/11/2022. 10:44 AM. Confirmed correct patient, procedure, site, and patient consented.   Anesthesia Topical anesthesia was used. Anesthetic medications included Lidocaine 2%, Proparacaine 0.5%.   Procedure Preparation included 5% betadine to ocular surface, eyelid speculum. A (32g) needle was used.   Injection: 1.25 mg Bevacizumab 1.25mg /0.17ml   Route: Intravitreal, Site: Left Eye   NDC: H061816, LotEA:[id number], Expiration date: 02/14/2023   Post-op Post injection exam found visual acuity of at least counting fingers. The patient tolerated the procedure well. There were no complications. The patient received written and verbal post procedure care education.            ASSESSMENT/PLAN:    ICD-10-CM   1. Exudative age-related macular degeneration of both eyes with active choroidal neovascularization (HCC)  H35.3231 OCT, Retina - OU - Both Eyes    Intravitreal Injection, Pharmacologic Agent - OS - Left Eye    Bevacizumab (AVASTIN) SOLN 1.25 mg    CANCELED: Intravitreal Injection, Pharmacologic Agent - OS - Left Eye    2. Combined forms of age-related cataract of right eye  H25.811     3. Pseudophakia  Z96.1      Exudative age related macular degeneration, both eyes  - Dr. Zadie Rhine pt here due to insurance reasons - history of multiple IVA OU on q6-8 scheduled (last IVA OU --11.15.23 - Dr. Zadie Rhine)   - s/p IVA OS #1 (01.22.24) - OCT shows OD: Focal ORA temporal macula, no fluid or edema; OS: Interval increase in [facility] nasal fovea overlying stable shallow PED at 9 weeks  - recommend IVA OS #2 today, 03.27.24 with f/u in 7-8 weeks; recommend holding IVA OD again  - pt wishes proceed with injection OS  - RBA of procedure discussed, questions answered - informed consent obtained and signed - see procedure note  - f/u in 7-8 wks, sooner prn -- DFE/OCT, possible injection(s)  2. Mixed Cataract OD - The symptoms of  cataract, surgical options, and treatments and risks were discussed with patient. - discussed diagnosis and progression - monitor  3. Pseudophakia OS  - s/p CE/IOL OS  - IOL in good position, doing well  - monitor  Ophthalmic Meds Ordered this visit:  Meds ordered this encounter  Medications   Bevacizumab (AVASTIN) SOLN 1.25 mg     Return for f/u 7-8 weeks, exu ARMD OU, DFE, OCT.  There are no Patient Instructions on file for this visit.   Explained the diagnoses, plan, and follow up with the patient and they expressed understanding.  Patient expressed understanding of the importance of proper follow up care.   This document serves as a record of services personally performed by Gardiner Sleeper, MD, PhD. It was created on their behalf by Orvan Falconer, an ophthalmic technician. The creation of this record is the provider's dictation and/or activities during the visit.    Electronically signed by: Orvan Falconer, OA, 11/12/22  1:11 PM  This document serves as a record of services personally performed by Gardiner Sleeper, MD, PhD. It was created on their behalf by Renaldo Reel, [place] an ophthalmic technician. The creation of this record is the provider's dictation and/or activities during the visit.    Electronically signed by:  Renaldo Reel, COT 03.27.24 10:36AM   Gardiner Sleeper, M.D., Ph.D. Diseases & Surgery of  the Retina and Vitreous [facility]  I have reviewed the above documentation for accuracy and completeness, and I agree with the above. Gardiner Sleeper, M.D., Ph.D. 11/12/22 1:13 PM  Abbreviations: M myopia (nearsighted); A astigmatism; H hyperopia (farsighted); P presbyopia; Mrx spectacle prescription;  CTL contact lenses; OD right eye; OS left eye; OU both eyes  XT exotropia; ET esotropia; PEK punctate epithelial keratitis; PEE punctate epithelial erosions; DES dry eye syndrome; MGD meibomian gland dysfunction; ATs artificial  tears; PFAT's preservative free artificial tears; [place] nuclear sclerotic cataract; PSC posterior subcapsular cataract; ERM epi-retinal membrane; PVD posterior vitreous detachment; RD retinal detachment; DM diabetes mellitus; DR diabetic retinopathy; NPDR non-proliferative diabetic retinopathy; PDR proliferative diabetic retinopathy; CSME clinically significant macular edema; DME diabetic macular edema; dbh dot blot hemorrhages; CWS cotton wool spot; POAG primary open angle glaucoma; C/D cup-to-disc ratio; HVF humphrey visual field; GVF goldmann visual field; OCT optical coherence tomography; IOP intraocular pressure; BRVO Branch retinal vein occlusion; CRVO central retinal vein occlusion; CRAO central retinal artery occlusion; BRAO branch retinal artery occlusion; RT retinal tear; SB scleral buckle; PPV pars plana vitrectomy; VH Vitreous hemorrhage; PRP panretinal laser photocoagulation; IVK intravitreal kenalog; VMT vitreomacular traction; MH Macular hole;  NVD neovascularization of the disc; NVE neovascularization elsewhere; AREDS age related eye disease study; ARMD age related macular degeneration; POAG primary open angle glaucoma; EBMD epithelial/anterior basement membrane dystrophy; ACIOL anterior chamber intraocular lens; IOL intraocular lens; PCIOL posterior chamber intraocular lens; Phaco/IOL phacoemulsification with intraocular lens placement; [place] photorefractive keratectomy; LASIK laser assisted in situ keratomileusis; HTN hypertension; DM diabetes mellitus; COPD chronic obstructive pulmonary disease

## 2022-11-05 NOTE — Progress Notes (Signed)
Daily Session Note  Patient Details  Name: Frank Meyer MRN: [id number] Date of Birth: [birth year] Referring Provider:   April Manson Pulmonary Rehab Walk Test from 10/09/2022 in [facility] for Heart, Vascular, & [place]  Referring Provider [place]       Encounter Date: 11/05/2022  Check In:  Session Check In - 11/05/22 1214       Check-In   Supervising physician immediately available to respond to emergencies [facility] MD immediately available    Physician(s) [place]    Location [facility]    Staff Present Janine Ores, RN, Quentin Ore, MS, ACSM-CEP, Exercise Physiologist;Other;Samantha [place], [place], LDN;Carlette Wilber Oliphant, RN, BSN    Virtual Visit No    Medication changes reported     No    Fall or balance concerns reported    No    Tobacco Cessation No Change    Warm-up and Cool-down Performed as group-led Higher education careers adviser Performed Yes    VAD Patient? No    PAD/SET Patient? No      Pain Assessment   Currently in Pain? No/denies    Pain Score 0-No pain    Multiple Pain Sites No             Capillary Blood Glucose: No results found for this or any previous visit (from the past 24 hour(s)).    Social History   Tobacco Use  Smoking Status Former   Packs/day: 2.00   Years: 25.00   Additional pack years: 0.00   Total pack years: 50.00   Types: Cigarettes   Quit date: 27   Years since quitting: 30.2  Smokeless Tobacco Never    Goals Met:  Exercise tolerated well No report of concerns or symptoms today Strength training completed today  Goals Unmet:  Not Applicable  Comments: Service time is from 1005 to 1136.    Dr. Rodman Pickle is Medical Director for Pulmonary Rehab at [facility].

## 2022-11-10 ENCOUNTER — Encounter (HOSPITAL_COMMUNITY)
Admission: RE | Admit: 2022-11-10 | Discharge: 2022-11-10 | Disposition: A | Discharge status: Home / Self Care | Payer: Medicare Other | Source: Ambulatory Visit | Attending: Pulmonary Disease | Admitting: Pulmonary Disease

## 2022-11-10 DIAGNOSIS — Z5189 Encounter for other specified aftercare: Secondary | ICD-10-CM | POA: Diagnosis not present

## 2022-11-10 DIAGNOSIS — I5032 Chronic diastolic (congestive) heart failure: Secondary | ICD-10-CM

## 2022-11-10 NOTE — Progress Notes (Signed)
Daily Session Note  Patient Details  Name: Frank Meyer MRN: [id number] Date of Birth: 12-Sep-1940 Referring Provider:   April Manson Pulmonary Rehab Walk Test from 10/09/2022 in [facility] for Heart, Vascular, & [place]  Referring Provider [place]       Encounter Date: 11/10/2022  Check In:  Session Check In - 11/10/22 1121       Check-In   Supervising physician immediately available to respond to emergencies [facility] MD immediately available    Physician(s) [place] Crane, NP    Location [facility]    Staff Present Janine Ores, RN, Quentin Ore, MS, ACSM-CEP, Exercise Physiologist;Sage Kopera Yevonne Pax, ACSM-CEP, Exercise Physiologist;Casey Ruffin Pyo [place], RD, LDN    Virtual Visit No    Medication changes reported     No    Fall or balance concerns reported    No    Tobacco Cessation No Change    Warm-up and Cool-down Performed as group-led instruction    Resistance Training Performed Yes    VAD Patient? No    PAD/SET Patient? No      Pain Assessment   Currently in Pain? No/denies             Capillary Blood Glucose: No results found for this or any previous visit (from the past 24 hour(s)).    Social History   Tobacco Use  Smoking Status Former   Packs/day: 2.00   Years: 25.00   Additional pack years: 0.00   Total pack years: 50.00   Types: Cigarettes   Quit date: 37   Years since quitting: 30.2  Smokeless Tobacco Never    Goals Met:  Independence with exercise equipment Exercise tolerated well No report of concerns or symptoms today Strength training completed today  Goals Unmet:  Not Applicable  Comments: Service time is from 1017 to 1140.    Dr. Rodman Pickle is Medical Director for Pulmonary Rehab at [facility].

## 2022-11-11 ENCOUNTER — Encounter (INDEPENDENT_AMBULATORY_CARE_PROVIDER_SITE_OTHER): Payer: Self-pay | Admitting: Ophthalmology

## 2022-11-11 ENCOUNTER — Ambulatory Visit (INDEPENDENT_AMBULATORY_CARE_PROVIDER_SITE_OTHER): Payer: Medicare Other | Admitting: Ophthalmology

## 2022-11-11 DIAGNOSIS — Z961 Presence of intraocular lens: Secondary | ICD-10-CM | POA: Diagnosis not present

## 2022-11-11 DIAGNOSIS — H25811 Combined forms of age-related cataract, right eye: Secondary | ICD-10-CM | POA: Diagnosis not present

## 2022-11-11 DIAGNOSIS — H353231 Exudative age-related macular degeneration, bilateral, with active choroidal neovascularization: Secondary | ICD-10-CM

## 2022-11-11 MED ORDER — BEVACIZUMAB CHEMO INJECTION 1.25MG/0.05ML SYRINGE FOR KALEIDOSCOPE
1.2500 mg | INTRAVITREAL | Status: AC | PRN
  Administered 2022-11-11: 1.25 mg via INTRAVITREAL

## 2022-11-12 ENCOUNTER — Encounter (HOSPITAL_COMMUNITY)
Admission: RE | Admit: 2022-11-12 | Discharge: 2022-11-12 | Disposition: A | Discharge status: Home / Self Care | Payer: Medicare Other | Source: Ambulatory Visit | Attending: Pulmonary Disease | Admitting: Pulmonary Disease

## 2022-11-12 DIAGNOSIS — Z5189 Encounter for other specified aftercare: Secondary | ICD-10-CM | POA: Diagnosis not present

## 2022-11-12 DIAGNOSIS — I5032 Chronic diastolic (congestive) heart failure: Secondary | ICD-10-CM

## 2022-11-12 NOTE — Progress Notes (Signed)
Daily Session Note  Patient Details  Name: Frank Meyer MRN: [id number] Date of Birth: 1941/05/28 Referring Provider:   April Manson Pulmonary Rehab Walk Test from 10/09/2022 in [facility] for Heart, Vascular, & [place]  Referring Provider [place]       Encounter Date: 11/12/2022  Check In:  Session Check In - 11/12/22 1119       Check-In   Supervising physician immediately available to respond to emergencies [facility] MD immediately available    Physician(s) [place]    [facility]    Staff Present Janine Ores, RN, Rico Ala, MS, Exercise Physiologist;Kaylee Rosana Hoes, MS, ACSM-CEP, Exercise Physiologist;Randi Yevonne Pax, ACSM-CEP, Exercise Physiologist;Beverely Suen [place], MS, Exercise Physiologist;Samantha [place], RD, LDN    Virtual Visit No    Medication changes reported     No    Fall or balance concerns reported    No    Tobacco Cessation No Change    Warm-up and Cool-down Performed as group-led instruction    Resistance Training Performed Yes    VAD Patient? No    PAD/SET Patient? No             Capillary Blood Glucose: No results found for this or any previous visit (from the past 24 hour(s)).    Social History   Tobacco Use  Smoking Status Former   Packs/day: 2.00   Years: 25.00   Additional pack years: 0.00   Total pack years: 50.00   Types: Cigarettes   Quit date: 4   Years since quitting: 30.2  Smokeless Tobacco Never    Goals Met:  Proper associated with RPD/PD & O2 Sat Independence with exercise equipment Exercise tolerated well No report of concerns or symptoms today Strength training completed today  Goals Unmet:  Not Applicable  Comments: Service time is from 1007 to 1140.    Dr. Rodman Pickle is Medical Director for Pulmonary Rehab at [facility].

## 2022-11-17 ENCOUNTER — Encounter (HOSPITAL_COMMUNITY)
Admission: RE | Admit: 2022-11-17 | Discharge: 2022-11-17 | Disposition: A | Discharge status: Home / Self Care | Payer: Medicare Other | Source: Ambulatory Visit | Attending: Pulmonary Disease | Admitting: Pulmonary Disease

## 2022-11-17 VITALS — Wt 171.3 lb

## 2022-11-17 DIAGNOSIS — I5032 Chronic diastolic (congestive) heart failure: Secondary | ICD-10-CM | POA: Insufficient documentation

## 2022-11-17 NOTE — Progress Notes (Signed)
Pt requiring NRB mask while exercising in pulmonary rehab today. Sats 90% on the NRB mask. O2 was able to be weaned back to 6L before leaving PR with sats 97%. Message left to Dr Silas Flood.

## 2022-11-17 NOTE — Progress Notes (Signed)
Daily Session Note  Patient Details  Name: Frank Meyer MRN: [id number] Date of Birth: 1941/01/28 Referring Provider:   April Manson Pulmonary Rehab Walk Test from 10/09/2022 in [facility] for Heart, Vascular, & [place]  Referring Provider [place]       Encounter Date: 11/17/2022  Check In:  Session Check In - 11/17/22 1206       Check-In   Supervising physician immediately available to respond to emergencies [facility] MD immediately available    Physician(s) Ambrose Pancoast, NP    Location [facility]    Staff Present Josephina Shih BS, ACSM-CEP, Exercise Physiologist;Carlette Wilber Oliphant, RN, Quentin Ore, MS, ACSM-CEP, Exercise Physiologist;Kayleana Waites Ruffin Pyo [place], RD, LDN    Virtual Visit No    Medication changes reported     No    Fall or balance concerns reported    No    Tobacco Cessation No Change    Warm-up and Cool-down Performed as group-led instruction    Resistance Training Performed Yes    VAD Patient? No    PAD/SET Patient? No      Pain Assessment   Currently in Pain? No/denies    Multiple Pain Sites No             Capillary Blood Glucose: No results found for this or any previous visit (from the past 24 hour(s)).   Exercise Prescription Changes - 11/17/22 1200       Response to Exercise   Blood Pressure (Admit) 98/64    Blood Pressure (Exercise) 112/59    Blood Pressure (Exit) 110/60    Heart Rate (Admit) 112 bpm    Heart Rate (Exercise) 104 bpm    Heart Rate (Exit) 102 bpm    Oxygen Saturation (Admit) 95 %    Oxygen Saturation (Exercise) 90 %   pt had to be increased to NRB mask. Sats 90%   Oxygen Saturation (Exit) 97 %   O2 weaned to 6L   Rating of Perceived Exertion (Exercise) 13    Perceived Dyspnea (Exercise) 1    Duration Progress to 30 minutes of  aerobic without signs/symptoms of physical distress    Intensity THRR unchanged      Progression   Progression Continue to progress workloads  to maintain intensity without signs/symptoms of physical distress.      Resistance Training   Training Prescription Yes    Weight red bands    Reps 10-15    Time 10 Minutes      Oxygen   Oxygen Continuous    Liters 100% NRB mask      Recumbant Bike   Level 2    Minutes 15    METs 2      NuStep   Level 3    SPM 80    Minutes 15    METs 2      Oxygen   Maintain Oxygen Saturation 88% or higher             Social History   Tobacco Use  Smoking Status Former   Packs/day: 2.00   Years: 25.00   Additional pack years: 0.00   Total pack years: 50.00   Types: Cigarettes   Quit date: 2   Years since quitting: 30.2  Smokeless Tobacco Never    Goals Met:  Proper associated with RPD/PD & O2 Sat Independence with exercise equipment Exercise tolerated well No report of concerns or symptoms today Strength training completed today  Goals Unmet:  Not Applicable  Comments: Service time is from 1014 to 1145.    Dr. Rodman Pickle is Medical Director for Pulmonary Rehab at [facility].

## 2022-11-18 ENCOUNTER — Encounter: Payer: Self-pay | Admitting: Pulmonary Disease

## 2022-11-18 ENCOUNTER — Ambulatory Visit: Payer: Medicare Other | Admitting: Pulmonary Disease

## 2022-11-18 VITALS — BP 114/74 | HR 84 | Ht 66.0 in | Wt 169.0 lb

## 2022-11-18 DIAGNOSIS — J189 Pneumonia, unspecified organism: Secondary | ICD-10-CM

## 2022-11-18 DIAGNOSIS — J9611 Chronic respiratory failure with hypoxia: Secondary | ICD-10-CM

## 2022-11-18 DIAGNOSIS — J431 Panlobular emphysema: Secondary | ICD-10-CM

## 2022-11-18 MED ORDER — AMOXICILLIN-POT CLAVULANATE 875-125 MG PO TABS
1.0000 | ORAL_TABLET | Freq: Two times a day (BID) | ORAL | 0 refills | Status: DC
Start: 2022-11-18 — End: 2022-12-04

## 2022-11-18 MED ORDER — PREDNISONE 10 MG PO TABS: ORAL_TABLET | ORAL | 0 refills | Status: AC

## 2022-11-18 NOTE — Progress Notes (Signed)
Synopsis: Referred in April 2024 for acute visit, patient of Dr. Silas Flood with emphyema  Subjective:   PATIENT ID: Frank Meyer GENDER: male DOB: [birth year], MRN: [id number]  HPI  Chief Complaint  Patient presents with   Acute Visit    Increased SOB over the past few days.    [name] is an 82 year old male, former smoker with COPD/Emphysema, chronic hypoxemic respiratory failure, pulmonary hypertension, and diastolic heart failure who returns to pulmonary clinic for acute visit.   He was treated for COPD exacerbation 10/12/22 by his primary care team. He was admitted 1/26 to 1/31 for acute on chronic respiratory failure due to heart failure exacerbation and COPD exacerbation along with multifocal pneumonia. He completed his course of antibiotics and steroid taper.  He went to 4 pulmonary rehab sessions without issues. Yesterday, at pulmonary rehab he had significant increase in his O2 needs and was placed on non-rebreather mask.   He is overall feeling ok. He denies fevers, chills or sweats. Denies increased mucous production. Denies increased edema. He has a blood pressure and daily weight log. His weights have been stable between 166-169lbs and his blood pressure has been stable.    He is alert and oriented time 4. No confusion noted.  Patient walked in hallway on 6L oxygen with oxygen saturations above 92% on first lap (229ft). 50 ft into second lap, patient's oxygen saturations dropped to 79%. He remained asymptomatic. Pleth was strong on monitor. He recovered above 88% within a minute of rest.    Past Medical History:  Diagnosis Date   AKI (acute kidney injury) 08/03/2022   Allergic rhinitis 10/24/2009   Altered bowel habits 10/04/2020   Basal cell carcinoma    Benign prostatic hyperplasia with lower urinary tract symptoms 10/24/2009   Benign prostatic hypertrophy    Bradycardia 12/06/2019   Callosity 10/16/2013   Cardiomegaly 01/23/2015   Chronic diastolic  heart failure [id number]   Chronic respiratory failure 10/04/2020   Coronary artery calcification seen on CAT scan 05/28/2020   Dyslipidemia    Dyspnea on exertion 03/18/2018   Elevated troponin 08/03/2022   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   Encounter for general adult medical examination without abnormal findings 01/17/2015   Epistaxis 09/18/2016   Ex-cigarette smoker 01/20/2021   Exudative age-related macular degeneration of left eye with active choroidal neovascularization 12/18/2019   Gallbladder problem    Gastroesophageal reflux disease    Hiatal hernia    Hilar adenopathy 03/25/2018   History of seizures 08/03/2022   History of smoking 03/18/2018   History of transient ischemic attack (TIA) 10/24/2009   Hyperlipidemia 10/24/2009   Insomnia 05/09/2013   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   Internal hemorrhoid 10/04/2020   Macular degeneration    Macular pucker, right eye 04/30/2020   Mediastinal adenopathy 03/25/2018   Memory loss 05/09/2013   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   Mild memory disturbance    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   Noninfective gastroenteritis and colitis, unspecified 10/04/2020   Nuclear sclerotic cataract of right eye 12/18/2019   Other specified symptoms and signs involving the digestive system and abdomen 10/04/2020   Overweight 09/14/2018   Oxygen dependent 10/04/2020   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   Personal history of colonic polyps 10/04/2020   Posterior vitreous detachment of right eye 12/18/2019   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   Pulmonary hypertension    Right upper lobe pulmonary nodule 03/25/2018   Syncope and collapse  12/12/2018   TIA (transient ischemic attack)    Transient ischemic attack 10/24/2009   Vasomotor rhinitis 09/18/2016   Weight decreased 10/04/2020     Family History  Problem Relation Age of Onset   Obesity Mother     Memory loss Mother      Social History   Socioeconomic History   Marital status: Married    Spouse name: pamela   Number of children: 2   Years of education: college   Highest education level: Not on file  Occupational History   Occupation: retired  Tobacco Use   Smoking status: Former    Packs/day: 2.00    Years: 25.00    Additional pack years: 0.00    Total pack years: 50.00    Types: Cigarettes    Quit date: 1994    Years since quitting: 30.2   Smokeless tobacco: Never  Substance and Sexual Activity   Alcohol use: Yes    Alcohol/week: 15.0 - 20.0 standard drinks of alcohol    Types: 15 - 20 Cans of beer per week   Drug use: Never   Sexual activity: Not Currently  Other Topics Concern   Not on file  Social History Narrative   Pt is retired. He is married. The pt lives with his wife. He has a Best boy. The pt has 2 children.      Patient drinks 2-3 cups of caffeine daily.   Patient is right handed.    Social Determinants of Health   Financial Resource Strain: Not on file  Food Insecurity: No Food Insecurity (10/28/2022)   Hunger Vital Sign    Worried About Running Out of Food in the Last Year: Never true    Ran Out of Food in the Last Year: Never true  Transportation Needs: No Transportation Needs (10/28/2022)   PRAPARE - Hydrologist (Medical): No    Lack of Transportation (Non-Medical): No  Physical Activity: Not on file  Stress: Not on file  Social Connections: Not on file  Intimate Partner Violence: Not At Risk (10/28/2022)   Humiliation, Afraid, Rape, and Kick questionnaire    Fear of Current or Ex-Partner: No    Emotionally Abused: No    Physically Abused: No    Sexually Abused: No     No Known Allergies   Outpatient Medications Prior to Visit  Medication Sig Dispense Refill   ANORO ELLIPTA 62.5-25 MCG/ACT AEPB USE 1 INHALATION BY MOUTH DAILY 180 each 3   aspirin 81 MG EC tablet Take 81 mg by mouth daily.      atorvastatin (LIPITOR) 20 MG tablet Take 20 mg by mouth every evening.     cholecalciferol (VITAMIN D) 1000 UNITS tablet Take 1,000 Units by mouth daily.     donepezil (ARICEPT) 10 MG tablet Take 1 tablet (10 mg total) by mouth daily. 90 tablet 3   ENTRESTO 24-26 MG TAKE 1 TABLET BY MOUTH DAILY 100 tablet 2   fexofenadine (ALLEGRA) 180 MG tablet Take 180 mg by mouth daily.     finasteride (PROSCAR) 5 MG tablet Take 5 mg by mouth at bedtime.     furosemide (LASIX) 40 MG tablet Take 1 tablet (40 mg total) by mouth every other day. 30 tablet 0   ipratropium (ATROVENT) 0.06 % nasal spray Place 1-2 sprays into both nostrils See admin instructions. 2 sprays in the right nostril and 1 spray in the left nostril once daily     ipratropium-albuterol (DUONEB) 0.5-2.5 (  3) MG/3ML SOLN Take 3 mLs by nebulization 2 (two) times daily. 540 mL 3   Melatonin 10 MG TABS Take 20 mg by mouth at bedtime.     omeprazole (PRILOSEC) 40 MG capsule Take 40 mg by mouth daily.      OXYGEN Inhale 2-5 L into the lungs as directed. 5lt during the day and 2lt at night     thiamine 100 MG tablet Take 1 tablet (100 mg total) by mouth daily. 30 tablet 0   traZODone (DESYREL) 100 MG tablet TAKE 2 TABLETS BY MOUTH AT  BEDTIME 180 tablet 3   levETIRAcetam (KEPPRA) 250 MG tablet Take 1-2 tablets (250-500 mg total) by mouth See admin instructions. Take 250 mg by mouth in the morning and 500 mg by mouth at night. (Patient taking differently: Take 250-500 mg by mouth See admin instructions. Take one tablet (250 mg) by mouth in the morning and two tablets (500 mg) by mouth at night.) 270 tablet 3   nitroGLYCERIN (NITROSTAT) 0.4 MG SL tablet Place 1 tablet (0.4 mg total) under the tongue every 5 (five) minutes as needed. Chest pain (Patient taking differently: Place 0.4 mg under the tongue every 5 (five) minutes as needed for chest pain. Chest pain) 25 tablet 6   No facility-administered medications prior to visit.   Review of Systems   Constitutional:  Negative for chills, fever, malaise/fatigue and weight loss.  HENT:  Negative for congestion, sinus pain and sore throat.   Eyes: Negative.   Respiratory:  Positive for cough and shortness of breath. Negative for hemoptysis, sputum production and wheezing.   Cardiovascular:  Negative for chest pain, palpitations, orthopnea, claudication and leg swelling.  Gastrointestinal:  Negative for abdominal pain, heartburn, nausea and vomiting.  Genitourinary: Negative.   Musculoskeletal:  Negative for joint pain and myalgias.  Skin:  Negative for rash.  Neurological:  Negative for weakness.  Endo/Heme/Allergies: Negative.   Psychiatric/Behavioral: Negative.     Objective:   Vitals:   11/18/22 1536  BP: 114/74  Pulse: 84  SpO2: 97%  Weight: 169 lb (76.7 kg)  Height: 5\' 6"  (1.676 m)   Physical Exam Constitutional:      General: He is not in acute distress. HENT:     Head: Normocephalic and atraumatic.  Eyes:     Conjunctiva/sclera: Conjunctivae normal.  Cardiovascular:     Rate and Rhythm: Normal rate and regular rhythm.     Pulses: Normal pulses.     Heart sounds: Normal heart sounds. No murmur heard. Pulmonary:     Effort: Pulmonary effort is normal.     Breath sounds: Rales (mild scattered) present. No wheezing.  Musculoskeletal:     Right lower leg: No edema.     Left lower leg: No edema.  Skin:    General: Skin is warm and dry.  Neurological:     General: No focal deficit present.     Mental Status: He is alert.    CBC    Component Value Date/Time   WBC 11.6 (H) 10/30/2022 0018   RBC 4.02 (L) 10/30/2022 0018   HGB 10.9 (L) 10/30/2022 0018   HCT 33.8 (L) 10/30/2022 0018   PLT 237 10/30/2022 0018   MCV 84.1 10/30/2022 0018   MCH 27.1 10/30/2022 0018   MCHC 32.2 10/30/2022 0018   RDW 14.4 10/30/2022 0018   LYMPHSABS 3.7 10/27/2022 1040   MONOABS 1.1 (H) 10/27/2022 1040   EOSABS 0.8 (H) 10/27/2022 1040   BASOSABS 0.2 (H) 10/27/2022  1040       Latest Ref Rng & Units 10/30/2022   12:18 AM 10/29/2022   12:39 AM 10/28/2022    4:28 AM  BMP  Glucose 70 - 99 mg/dL 123  127  150   BUN 8 - 23 mg/dL 22  21  18    Creatinine 0.61 - 1.24 mg/dL 1.03  1.10  1.06   Sodium 135 - 145 mmol/L 137  140  137   Potassium 3.5 - 5.1 mmol/L 4.1  4.1  4.4   Chloride 98 - 111 mmol/L 104  107  103   CO2 22 - 32 mmol/L 28  28  25    Calcium 8.9 - 10.3 mg/dL 8.3  8.1  8.8    Chest imaging: CTA Chest 10/27/22 No evidence of pulmonary embolism.   Severe pulmonary emphysema. Increased diffuse ground-glass and new consolidative airspace disease in the lung bases and peripheral posterior aspect of the right upper lobe, concerning for pneumonia. Trace pleural effusions and trace pericardial effusion. Recommend follow-up chest CT in 3 months.   Unchanged right apical pleuroparenchymal scarring with waxing and waning nodular features dating back to at least June 2020.   Multiple prominent mediastinal lymph nodes, favored to be reactive. Recommend attention on follow-up.  PFT:    Latest Ref Rng & Units 06/26/2020    3:50 PM 04/27/2018    8:48 AM  PFT Results  FVC-Pre L 3.75  4.11   FVC-Predicted Pre % 111  119   FVC-Post L 3.85  4.07   FVC-Predicted Post % 113  118   Pre FEV1/FVC % % 78  79   Post FEV1/FCV % % 78  78   FEV1-Pre L 2.92  3.23   FEV1-Predicted Pre % 122  131   FEV1-Post L 3.02  3.20   DLCO uncorrected ml/min/mmHg 9.44  11.81   DLCO UNC% % 44  44   DLCO corrected ml/min/mmHg 9.61  11.92   DLCO COR %Predicted % 44  44   DLVA Predicted % 45  49   TLC L 5.75  5.24   TLC % Predicted % 92  84   RV % Predicted % 87  51   PFTs 2021 severe diffusion defect  Labs:  Path:  Echo 08/03/22: LV EF 55-60%. Grade I diastolic dysfunction. RV size is mildly enlarged and function is normal. LA is mild to moderately dilated.   Heart Catheterization:  Assessment & Plan:   Chronic respiratory failure with hypoxia  Panlobular  emphysema  Multifocal pneumonia - Plan: predniSONE (DELTASONE) 10 MG tablet, amoxicillin-clavulanate (AUGMENTIN) 875-125 MG tablet  Discussion: Goldman Saturno is an 82 year old male, former smoker with COPD/Emphysema, chronic hypoxemic respiratory failure, pulmonary hypertension, and diastolic heart failure who returns to pulmonary clinic for acute visit.   He has acute on chronic hypoxemic respiratory failure. CTA Chest was negative for PE in early March with evidence of multifocal pneumonia. He had stable exercise tolerance post hospitalization which changed yesterday with an increase in O2 needs at pulmonary rehab. No clear explanation for this change. He had significant O2 desaturation on 6L with simple walk today after one lap.   Recommend he stay out of pulmonary rehab at this time for his safety until we can potentially get him back to his baseline O2 needs with exertion.   Will treat with extended steroid taper and 14 day course of augmentin.   He is to follow up with Dr. Silas Flood in 2 weeks. Will determine if  he can resume pulm rehab at that time.  Frank Jackson, MD [facility] Office: [phone]    Current Outpatient Medications:    amoxicillin-clavulanate (AUGMENTIN) 875-125 MG tablet, Take 1 tablet by mouth 2 (two) times daily., Disp: 28 tablet, Rfl: 0   ANORO ELLIPTA 62.5-25 MCG/ACT AEPB, USE 1 INHALATION BY MOUTH DAILY, Disp: 180 each, Rfl: 3   aspirin 81 MG EC tablet, Take 81 mg by mouth daily., Disp: , Rfl:    atorvastatin (LIPITOR) 20 MG tablet, Take 20 mg by mouth every evening., Disp: , Rfl:    cholecalciferol (VITAMIN D) 1000 UNITS tablet, Take 1,000 Units by mouth daily., Disp: , Rfl:    donepezil (ARICEPT) 10 MG tablet, Take 1 tablet (10 mg total) by mouth daily., Disp: 90 tablet, Rfl: 3   ENTRESTO 24-26 MG, TAKE 1 TABLET BY MOUTH DAILY, Disp: 100 tablet, Rfl: 2   fexofenadine (ALLEGRA) 180 MG tablet, Take 180 mg by mouth daily., Disp: ,  Rfl:    finasteride (PROSCAR) 5 MG tablet, Take 5 mg by mouth at bedtime., Disp: , Rfl:    furosemide (LASIX) 40 MG tablet, Take 1 tablet (40 mg total) by mouth every other day., Disp: 30 tablet, Rfl: 0   ipratropium (ATROVENT) 0.06 % nasal spray, Place 1-2 sprays into both nostrils See admin instructions. 2 sprays in the right nostril and 1 spray in the left nostril once daily, Disp: , Rfl:    ipratropium-albuterol (DUONEB) 0.5-2.5 (3) MG/3ML SOLN, Take 3 mLs by nebulization 2 (two) times daily., Disp: 540 mL, Rfl: 3   Melatonin 10 MG TABS, Take 20 mg by mouth at bedtime., Disp: , Rfl:    omeprazole (PRILOSEC) 40 MG capsule, Take 40 mg by mouth daily. , Disp: , Rfl:    OXYGEN, Inhale 2-5 L into the lungs as directed. 5lt during the day and 2lt at night, Disp: , Rfl:    predniSONE (DELTASONE) 10 MG tablet, Take 4 tablets (40 mg total) by mouth daily with breakfast for 3 days, THEN 3 tablets (30 mg total) daily with breakfast for 3 days, THEN 2 tablets (20 mg total) daily with breakfast for 3 days, THEN 1 tablet (10 mg total) daily with breakfast for 3 days., Disp: 30 tablet, Rfl: 0   thiamine 100 MG tablet, Take 1 tablet (100 mg total) by mouth daily., Disp: 30 tablet, Rfl: 0   traZODone (DESYREL) 100 MG tablet, TAKE 2 TABLETS BY MOUTH AT  BEDTIME, Disp: 180 tablet, Rfl: 3   levETIRAcetam (KEPPRA) 250 MG tablet, Take 1-2 tablets (250-500 mg total) by mouth See admin instructions. Take 250 mg by mouth in the morning and 500 mg by mouth at night. (Patient taking differently: Take 250-500 mg by mouth See admin instructions. Take one tablet (250 mg) by mouth in the morning and two tablets (500 mg) by mouth at night.), Disp: 270 tablet, Rfl: 3   nitroGLYCERIN (NITROSTAT) 0.4 MG SL tablet, Place 1 tablet (0.4 mg total) under the tongue every 5 (five) minutes as needed. Chest pain (Patient taking differently: Place 0.4 mg under the tongue every 5 (five) minutes as needed for chest pain. Chest pain), Disp: 25  tablet, Rfl: 6

## 2022-11-18 NOTE — Patient Instructions (Addendum)
Continue Anoro 1 puff daily  Continue duoneb treatments as needed  Recommend using flutter valve twice daily after your nebulizer treatments to help with airway clearance.   Continue supplemental oxygen 6L, increase as needed for physical activity  Start extended prednisone taper  Start augmentin twice daily for 14 days  Continue lasix as directed  Recommend that you hold off on going to pulmonary rehab at this time until further follow up of your oxygen needs.  Follow up in 2 weeks with Dr. Silas Flood

## 2022-11-19 ENCOUNTER — Encounter (HOSPITAL_COMMUNITY): Payer: Medicare Other

## 2022-11-20 ENCOUNTER — Telehealth (HOSPITAL_COMMUNITY): Payer: Self-pay

## 2022-11-20 NOTE — Telephone Encounter (Signed)
Pt called to see how he was feeling. Pt stated he was taking steroids and antibiotics. Stated when he feels better he will be back at Pulmonary Rehab.

## 2022-11-24 ENCOUNTER — Encounter (HOSPITAL_COMMUNITY): Payer: Medicare Other

## 2022-11-24 ENCOUNTER — Telehealth (HOSPITAL_COMMUNITY): Payer: Self-pay

## 2022-11-24 NOTE — Telephone Encounter (Signed)
Called Frank Meyer to check on him. He starting to feel better. Frank Meyer is waiting to finish his antibiotics and steroids to come back to pulmonary rehab. I told him will call him later to talk about his return.

## 2022-11-25 NOTE — Progress Notes (Signed)
Pulmonary Individual Treatment Plan  Patient Details  Name: Frank Meyer MRN: [id number] Date of Birth: May 23, 1941 Referring Provider:   Doristine Devoid Pulmonary Rehab Walk Test from 10/09/2022 in [facility]  Referring Provider Hunsucker       Initial Encounter Date:  Flowsheet Row Pulmonary Rehab Walk Test from 10/09/2022 in [facility]  Date 10/09/22       Visit Diagnosis: Chronic diastolic HF (heart failure)  Patient's Home Medications on Admission:   Current Outpatient Medications:    amoxicillin-clavulanate (AUGMENTIN) 875-125 MG tablet, Take 1 tablet by mouth 2 (two) times daily., Disp: 28 tablet, Rfl: 0   ANORO ELLIPTA 62.5-25 MCG/ACT AEPB, USE 1 INHALATION BY MOUTH DAILY, Disp: 180 each, Rfl: 3   aspirin 81 MG EC tablet, Take 81 mg by mouth daily., Disp: , Rfl:    atorvastatin (LIPITOR) 20 MG tablet, Take 20 mg by mouth every evening., Disp: , Rfl:    cholecalciferol (VITAMIN D) 1000 UNITS tablet, Take 1,000 Units by mouth daily., Disp: , Rfl:    donepezil (ARICEPT) 10 MG tablet, Take 1 tablet (10 mg total) by mouth daily., Disp: 90 tablet, Rfl: 3   ENTRESTO 24-26 MG, TAKE 1 TABLET BY MOUTH DAILY, Disp: 100 tablet, Rfl: 2   fexofenadine (ALLEGRA) 180 MG tablet, Take 180 mg by mouth daily., Disp: , Rfl:    finasteride (PROSCAR) 5 MG tablet, Take 5 mg by mouth at bedtime., Disp: , Rfl:    furosemide (LASIX) 40 MG tablet, Take 1 tablet (40 mg total) by mouth every other day., Disp: 30 tablet, Rfl: 0   ipratropium (ATROVENT) 0.06 % nasal spray, Place 1-2 sprays into both nostrils See admin instructions. 2 sprays in the right nostril and 1 spray in the left nostril once daily, Disp: , Rfl:    ipratropium-albuterol (DUONEB) 0.5-2.5 (3) MG/3ML SOLN, Take 3 mLs by nebulization 2 (two) times daily., Disp: 540 mL, Rfl: 3   levETIRAcetam (KEPPRA) 250 MG tablet, Take  1-2 tablets (250-500 mg total) by mouth See admin instructions. Take 250 mg by mouth in the morning and 500 mg by mouth at night. (Patient taking differently: Take 250-500 mg by mouth See admin instructions. Take one tablet (250 mg) by mouth in the morning and two tablets (500 mg) by mouth at night.), Disp: 270 tablet, Rfl: 3   Melatonin 10 MG TABS, Take 20 mg by mouth at bedtime., Disp: , Rfl:    nitroGLYCERIN (NITROSTAT) 0.4 MG SL tablet, Place 1 tablet (0.4 mg total) under the tongue every 5 (five) minutes as needed. Chest pain (Patient taking differently: Place 0.4 mg under the tongue every 5 (five) minutes as needed for chest pain. Chest pain), Disp: 25 tablet, Rfl: 6   omeprazole (PRILOSEC) 40 MG capsule, Take 40 mg by mouth daily. , Disp: , Rfl:    OXYGEN, Inhale 2-5 L into the lungs as directed. 5lt during the day and 2lt at night, Disp: , Rfl:    predniSONE (DELTASONE) 10 MG tablet, Take 4 tablets (40 mg total) by mouth daily with breakfast for 3 days, THEN 3 tablets (30 mg total) daily with breakfast for 3 days, THEN 2 tablets (20 mg total) daily with breakfast for 3 days, THEN 1 tablet (10 mg total) daily with breakfast for 3 days., Disp: 30 tablet, Rfl: 0   thiamine 100 MG tablet, Take 1 tablet (100 mg total)  by mouth daily., Disp: 30 tablet, Rfl: 0   traZODone (DESYREL) 100 MG tablet, TAKE 2 TABLETS BY MOUTH AT  BEDTIME, Disp: 180 tablet, Rfl: 3  Past Medical History: Past Medical History:  Diagnosis Date   AKI (acute kidney injury) 08/03/2022   Allergic rhinitis 10/24/2009   Altered bowel habits 10/04/2020   Basal cell carcinoma    Benign prostatic hyperplasia with lower urinary tract symptoms 10/24/2009   Benign prostatic hypertrophy    Bradycardia 12/06/2019   Callosity 10/16/2013   Cardiomegaly 01/23/2015   Chronic diastolic heart failure 12/19/2018   Chronic respiratory failure 10/04/2020   Coronary artery calcification seen on CAT scan 05/28/2020   Dyslipidemia    Dyspnea  on exertion 03/18/2018   Elevated troponin 08/03/2022   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   Encounter for general adult medical examination without abnormal findings 01/17/2015   Epistaxis 09/18/2016   Ex-cigarette smoker 01/20/2021   Exudative age-related macular degeneration of left eye with active choroidal neovascularization 12/18/2019   Gallbladder problem    Gastroesophageal reflux disease    Hiatal hernia    Hilar adenopathy 03/25/2018   History of seizures 08/03/2022   History of smoking 03/18/2018   History of transient ischemic attack (TIA) 10/24/2009   Hyperlipidemia 10/24/2009   Insomnia 05/09/2013   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   Internal hemorrhoid 10/04/2020   Macular degeneration    Macular pucker, right eye 04/30/2020   Mediastinal adenopathy 03/25/2018   Memory loss 05/09/2013   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   Mild memory disturbance    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   Noninfective gastroenteritis and colitis, unspecified 10/04/2020   Nuclear sclerotic cataract of right eye 12/18/2019   Other specified symptoms and signs involving the digestive system and abdomen 10/04/2020   Overweight 09/14/2018   Oxygen dependent 10/04/2020   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   Personal history of colonic polyps 10/04/2020   Posterior vitreous detachment of right eye 12/18/2019   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   Pulmonary hypertension    Right upper lobe pulmonary nodule 03/25/2018   Syncope and collapse 12/12/2018   TIA (transient ischemic attack)    Transient ischemic attack 10/24/2009   Vasomotor rhinitis 09/18/2016   Weight decreased 10/04/2020    Tobacco Use: Social History   Tobacco Use  Smoking Status Former   Packs/day: 2.00   Years: 25.00   Additional pack years: 0.00   Total pack years: 50.00   Types: Cigarettes   Quit date: 1994   Years since  quitting: 30.2  Smokeless Tobacco Never    Labs: Review Flowsheet  More data may exist      Latest Ref Rng & Units 06/13/2020 07/20/2022 08/02/2022 08/03/2022 10/27/2022  Labs for ITP Cardiac and Pulmonary Rehab  Cholestrol 100 - 199 mg/dL 454147  - - - -  LDL (calc) 0 - 99 mg/dL 67  - - - -  HDL-C >09>39 mg/dL 57  - - - -  Trlycerides 0 - 149 mg/dL 811129  - - - -  PH, Arterial 7.35 - 7.45 - 7.402  - - -  PCO2 arterial 32 - 48 mmHg - 37.0  - - -  Bicarbonate 20.0 - 28.0 mmol/L - 23.1  21.2  29.2  30.8   TCO2 22 - 32 mmol/L - 24  23  - 33   Acid-base deficit 0.0 - 2.0 mmol/L - 1.0  6.0  - -  O2 Saturation % -  95  71  32.6  45     Capillary Blood Glucose: Lab Results  Component Value Date   GLUCAP 134 (H) 07/22/2022   GLUCAP 193 (H) 07/21/2022   GLUCAP 100 (H) 12/12/2018   GLUCAP 101 (H) 04/08/2018     Pulmonary Assessment Scores:  Pulmonary Assessment Scores     Row Name 10/09/22 1336         ADL UCSD   ADL Phase Entry     SOB Score total 49       CAT Score   CAT Score 17       mMRC Score   mMRC Score 4             UCSD: Self-administered rating of dyspnea associated with activities of daily living (ADLs) 6-point scale (0 = "not at all" to 5 = "maximal or unable to do because of breathlessness")  Scoring Scores range from 0 to 120.  Minimally important difference is 5 units  CAT: CAT can identify the health impairment of COPD patients and is better correlated with disease progression.  CAT has a scoring range of zero to 40. The CAT score is classified into four groups of low (less than 10), medium (10 - 20), high (21-30) and very high (31-40) based on the impact level of disease on health status. A CAT score over 10 suggests significant symptoms.  A worsening CAT score could be explained by an exacerbation, poor medication adherence, poor inhaler technique, or progression of COPD or comorbid conditions.  CAT MCID is 2 points  mMRC: mMRC (Modified Medical Research  Council) Dyspnea Scale is used to assess the degree of baseline functional disability in patients of respiratory disease due to dyspnea. No minimal important difference is established. A decrease in score of 1 point or greater is considered a positive change.   Pulmonary Function Assessment:  Pulmonary Function Assessment - 10/09/22 1513       Breath   Bilateral Breath Sounds Decreased    Shortness of Breath No;Limiting activity             Exercise Target Goals: Exercise Program Goal: Individual exercise prescription set using results from initial 6 min walk test and THRR while considering  patient's activity barriers and safety.   Exercise Prescription Goal: Initial exercise prescription builds to 30-45 minutes a day of aerobic activity, 2-3 days per week.  Home exercise guidelines will be given to patient during program as part of exercise prescription that the participant will acknowledge.  Activity Barriers & Risk Stratification:  Activity Barriers & Cardiac Risk Stratification - 10/09/22 1340       Activity Barriers & Cardiac Risk Stratification   Activity Barriers Shortness of Breath;Muscular Weakness;Deconditioning;Other (comment);Balance Concerns;History of Falls    Comments O2    Cardiac Risk Stratification High             6 Minute Walk:  6 Minute Walk     Row Name 10/09/22 1504         6 Minute Walk   Phase Initial     Distance 730 feet     Walk Time 6 minutes     # of Rest Breaks 1  2:40-4:10 due to hypoxia at 82 on 8L/40 FiO2     MPH 1.38     METS 1.06     RPE 9     Perceived Dyspnea  0     VO2 Peak 3.7     Symptoms No  pt  sts not SOB     Resting HR 91 bpm     Resting BP 100/64     Resting Oxygen Saturation  90 %     Exercise Oxygen Saturation  during 6 min walk 82 %     Max Ex. HR 105 bpm     Max Ex. BP 94/54     2 Minute Post BP 96/68       Interval HR   1 Minute HR 95     2 Minute HR 104     3 Minute HR 105     4 Minute HR 97      5 Minute HR 96     6 Minute HR 105     2 Minute Post HR 91     Interval Heart Rate? Yes       Interval Oxygen   Interval Oxygen? Yes     Baseline Oxygen Saturation % 90 %     1 Minute Oxygen Saturation % 95 %     1 Minute Liters of Oxygen 8 L     2 Minute Oxygen Saturation % 91 %     2 Minute Liters of Oxygen 8 L     3 Minute Oxygen Saturation % 85 %     3 Minute Liters of Oxygen 10 L     4 Minute Oxygen Saturation % 90 %     4 Minute Liters of Oxygen 10 L     5 Minute Oxygen Saturation % 98 %     5 Minute Liters of Oxygen 10 L     6 Minute Oxygen Saturation % 94 %     6 Minute Liters of Oxygen 10 L     2 Minute Post Oxygen Saturation % 94 %     2 Minute Post Liters of Oxygen 10 L              Oxygen Initial Assessment:  Oxygen Initial Assessment - 10/09/22 1332       Home Oxygen   Home Oxygen Device Portable Concentrator;Home Concentrator;E-Tanks    Sleep Oxygen Prescription Continuous    Liters per minute 2    Home Exercise Oxygen Prescription Continuous    Liters per minute 6    Home Resting Oxygen Prescription Continuous    Liters per minute 5   only due to not being able to change concentrator setting without walking over and back when resting. Prescription is 2L per pt   Compliance with Home Oxygen Use Yes      Initial 6 min Walk   Oxygen Used Continuous    Liters per minute 10    FiO2% 45   venturi     Program Oxygen Prescription   Program Oxygen Prescription Continuous    Liters per minute 10    FiO2% 45    Comments venturi      Intervention   Short Term Goals To learn and exhibit compliance with exercise, home and travel O2 prescription;To learn and understand importance of maintaining oxygen saturations>88%;To learn and demonstrate proper use of respiratory medications;To learn and understand importance of monitoring SPO2 with pulse oximeter and demonstrate accurate use of the pulse oximeter.;To learn and demonstrate proper pursed lip breathing  techniques or other breathing techniques.     Long  Term Goals Demonstrates proper use of MDI's;Compliance with respiratory medication;Exhibits proper breathing techniques, such as pursed lip breathing or other method taught during program session;Maintenance of O2 saturations>88%;Verbalizes importance of monitoring  SPO2 with pulse oximeter and return demonstration;Exhibits compliance with exercise, home  and travel O2 prescription             Oxygen Re-Evaluation:  Oxygen Re-Evaluation     Row Name 10/23/22 0804 11/16/22 1109           Program Oxygen Prescription   Program Oxygen Prescription Continuous Continuous      Liters per minute 15 -5      FiO2% 45 45      Comments NRB mask uses high flow nasal cannula        Home Oxygen   Home Oxygen Device Portable Concentrator;Home Concentrator;E-Tanks Portable Concentrator;Home Concentrator;E-Tanks      Sleep Oxygen Prescription Continuous Continuous      Liters per minute 2 2      Home Exercise Oxygen Prescription Continuous Continuous      Liters per minute 6 6      Home Resting Oxygen Prescription Continuous Continuous      Liters per minute 5  only due to not being able to change concentrator setting without walking over and back when resting. Prescription is 2L per pt 5  only due to not being able to change concentrator setting without walking over and back when resting. Prescription is 2L per pt      Compliance with Home Oxygen Use Yes Yes        Goals/Expected Outcomes   Short Term Goals To learn and exhibit compliance with exercise, home and travel O2 prescription;To learn and understand importance of maintaining oxygen saturations>88%;To learn and demonstrate proper use of respiratory medications;To learn and understand importance of monitoring SPO2 with pulse oximeter and demonstrate accurate use of the pulse oximeter.;To learn and demonstrate proper pursed lip breathing techniques or other breathing techniques.  To learn and  exhibit compliance with exercise, home and travel O2 prescription;To learn and understand importance of maintaining oxygen saturations>88%;To learn and demonstrate proper use of respiratory medications;To learn and understand importance of monitoring SPO2 with pulse oximeter and demonstrate accurate use of the pulse oximeter.;To learn and demonstrate proper pursed lip breathing techniques or other breathing techniques.       Long  Term Goals Demonstrates proper use of MDI's;Compliance with respiratory medication;Exhibits proper breathing techniques, such as pursed lip breathing or other method taught during program session;Maintenance of O2 saturations>88%;Verbalizes importance of monitoring SPO2 with pulse oximeter and return demonstration;Exhibits compliance with exercise, home  and travel O2 prescription Demonstrates proper use of MDI's;Compliance with respiratory medication;Exhibits proper breathing techniques, such as pursed lip breathing or other method taught during program session;Maintenance of O2 saturations>88%;Verbalizes importance of monitoring SPO2 with pulse oximeter and return demonstration;Exhibits compliance with exercise, home  and travel O2 prescription      Goals/Expected Outcomes Compliance and understanding of oxygen saturation monitoring and breathing techniques to decrease shortness of breath. Compliance and understanding of oxygen saturation monitoring and breathing techniques to decrease shortness of breath.               Oxygen Discharge (Final Oxygen Re-Evaluation):  Oxygen Re-Evaluation - 11/16/22 1109       Program Oxygen Prescription   Program Oxygen Prescription Continuous    Liters per minute -5    FiO2% 45    Comments uses high flow nasal cannula      Home Oxygen   Home Oxygen Device Portable Concentrator;Home Concentrator;E-Tanks    Sleep Oxygen Prescription Continuous    Liters per minute 2    Home Exercise Oxygen Prescription  Continuous    Liters per  minute 6    Home Resting Oxygen Prescription Continuous    Liters per minute 5   only due to not being able to change concentrator setting without walking over and back when resting. Prescription is 2L per pt   Compliance with Home Oxygen Use Yes      Goals/Expected Outcomes   Short Term Goals To learn and exhibit compliance with exercise, home and travel O2 prescription;To learn and understand importance of maintaining oxygen saturations>88%;To learn and demonstrate proper use of respiratory medications;To learn and understand importance of monitoring SPO2 with pulse oximeter and demonstrate accurate use of the pulse oximeter.;To learn and demonstrate proper pursed lip breathing techniques or other breathing techniques.     Long  Term Goals Demonstrates proper use of MDI's;Compliance with respiratory medication;Exhibits proper breathing techniques, such as pursed lip breathing or other method taught during program session;Maintenance of O2 saturations>88%;Verbalizes importance of monitoring SPO2 with pulse oximeter and return demonstration;Exhibits compliance with exercise, home  and travel O2 prescription    Goals/Expected Outcomes Compliance and understanding of oxygen saturation monitoring and breathing techniques to decrease shortness of breath.             Initial Exercise Prescription:  Initial Exercise Prescription - 10/09/22 1500       Date of Initial Exercise RX and Referring Provider   Date 10/09/22    Referring Provider Hunsucker    Expected Discharge Date 01/07/23      Oxygen   Oxygen Continuous    Liters 10L/45 FiO2    Maintain Oxygen Saturation 88% or higher      Recumbant Bike   Level 1    RPM 70    Minutes 15    METs 1.5      NuStep   Level 1    SPM 70    Minutes 15    METs 1.5      Prescription Details   Frequency (times per week) 2    Duration Progress to 30 minutes of continuous aerobic without signs/symptoms of physical distress      Intensity    THRR 40-80% of Max Heartrate 56-111    Ratings of Perceived Exertion 11-13    Perceived Dyspnea 0-4      Progression   Progression Continue to progress workloads to maintain intensity without signs/symptoms of physical distress.      Resistance Training   Training Prescription Yes    Weight red bands    Reps 10-15             Perform Capillary Blood Glucose checks as needed.  Exercise Prescription Changes:   Exercise Prescription Changes     Row Name 10/22/22 1200 11/03/22 1200 11/17/22 1200         Response to Exercise   Blood Pressure (Admit) 110/70 98/60 98/64      Blood Pressure (Exercise) 118/68 118/58 112/59     Blood Pressure (Exit) 98/60 92/56 110/60     Heart Rate (Admit) 94 bpm 110 bpm 112 bpm     Heart Rate (Exercise) 92 bpm 108 bpm 104 bpm     Heart Rate (Exit) 87 bpm 85 bpm 102 bpm     Oxygen Saturation (Admit) 93 % 91 % 95 %     Oxygen Saturation (Exercise) 90 %  15L @ 55% 89 % 90 %  pt had to be increased to NRB mask. Sats 90%     Oxygen Saturation (Exit) 93 % 99 %  97 %  O2 weaned to 6L     Rating of Perceived Exertion (Exercise) 11 13 13      Perceived Dyspnea (Exercise) 1 1 1      Duration Progress to 30 minutes of  aerobic without signs/symptoms of physical distress Progress to 30 minutes of  aerobic without signs/symptoms of physical distress Progress to 30 minutes of  aerobic without signs/symptoms of physical distress     Intensity THRR unchanged THRR unchanged THRR unchanged       Progression   Progression Continue to progress workloads to maintain intensity without signs/symptoms of physical distress. Continue to progress workloads to maintain intensity without signs/symptoms of physical distress. Continue to progress workloads to maintain intensity without signs/symptoms of physical distress.       Resistance Training   Training Prescription Yes Yes Yes     Weight red bands red bands red bands     Reps 10-15 10-15 10-15     Time 10 Minutes 10  Minutes 10 Minutes       Oxygen   Oxygen Continuous Continuous Continuous     Liters 15L/55 FiO2 8 100% NRB mask       Recumbant Bike   Level 1 1 2      Minutes 15 15 15      METs 1.8 1.8 2       NuStep   Level 3 3 3      SPM 80 80 80     Minutes 15 15 15      METs 2.4 2.2 2       Oxygen   Maintain Oxygen Saturation 88% or higher 88% or higher 88% or higher              Exercise Comments:   Exercise Goals and Review:   Exercise Goals     Row Name 10/09/22 1335 10/23/22 0801 11/16/22 1053         Exercise Goals   Increase Physical Activity Yes Yes Yes     Intervention Provide advice, education, support and counseling about physical activity/exercise needs.;Develop an individualized exercise prescription for aerobic and resistive training based on initial evaluation findings, risk stratification, comorbidities and participant's personal goals. Provide advice, education, support and counseling about physical activity/exercise needs.;Develop an individualized exercise prescription for aerobic and resistive training based on initial evaluation findings, risk stratification, comorbidities and participant's personal goals. Provide advice, education, support and counseling about physical activity/exercise needs.;Develop an individualized exercise prescription for aerobic and resistive training based on initial evaluation findings, risk stratification, comorbidities and participant's personal goals.     Expected Outcomes Short Term: Attend rehab on a regular basis to increase amount of physical activity.;Long Term: Add in home exercise to make exercise part of routine and to increase amount of physical activity.;Long Term: Exercising regularly at least 3-5 days a week. Short Term: Attend rehab on a regular basis to increase amount of physical activity.;Long Term: Add in home exercise to make exercise part of routine and to increase amount of physical activity.;Long Term: Exercising  regularly at least 3-5 days a week. Short Term: Attend rehab on a regular basis to increase amount of physical activity.;Long Term: Add in home exercise to make exercise part of routine and to increase amount of physical activity.;Long Term: Exercising regularly at least 3-5 days a week.     Increase Strength and Stamina Yes Yes Yes     Intervention Provide advice, education, support and counseling about physical activity/exercise needs.;Develop an individualized exercise prescription for aerobic  and resistive training based on initial evaluation findings, risk stratification, comorbidities and participant's personal goals. Provide advice, education, support and counseling about physical activity/exercise needs.;Develop an individualized exercise prescription for aerobic and resistive training based on initial evaluation findings, risk stratification, comorbidities and participant's personal goals. Provide advice, education, support and counseling about physical activity/exercise needs.;Develop an individualized exercise prescription for aerobic and resistive training based on initial evaluation findings, risk stratification, comorbidities and participant's personal goals.     Expected Outcomes Short Term: Increase workloads from initial exercise prescription for resistance, speed, and METs.;Short Term: Perform resistance training exercises routinely during rehab and add in resistance training at home;Long Term: Improve cardiorespiratory fitness, muscular endurance and strength as measured by increased METs and functional capacity ( ) Short Term: Increase workloads from initial exercise prescription for resistance, speed, and METs.;Short Term: Perform resistance training exercises routinely during rehab and add in resistance training at home;Long Term: Improve cardiorespiratory fitness, muscular endurance and strength as measured by increased METs and functional capacity ( ) Short Term: Increase workloads  from initial exercise prescription for resistance, speed, and METs.;Short Term: Perform resistance training exercises routinely during rehab and add in resistance training at home;Long Term: Improve cardiorespiratory fitness, muscular endurance and strength as measured by increased METs and functional capacity ( )     Able to understand and use rate of perceived exertion (RPE) scale Yes Yes Yes     Intervention Provide education and explanation on how to use RPE scale Provide education and explanation on how to use RPE scale Provide education and explanation on how to use RPE scale     Expected Outcomes Short Term: Able to use RPE daily in rehab to express subjective intensity level;Long Term:  Able to use RPE to guide intensity level when exercising independently Short Term: Able to use RPE daily in rehab to express subjective intensity level;Long Term:  Able to use RPE to guide intensity level when exercising independently Short Term: Able to use RPE daily in rehab to express subjective intensity level;Long Term:  Able to use RPE to guide intensity level when exercising independently     Able to understand and use Dyspnea scale Yes Yes Yes     Intervention Provide education and explanation on how to use Dyspnea scale Provide education and explanation on how to use Dyspnea scale Provide education and explanation on how to use Dyspnea scale     Expected Outcomes Short Term: Able to use Dyspnea scale daily in rehab to express subjective sense of shortness of breath during exertion;Long Term: Able to use Dyspnea scale to guide intensity level when exercising independently Short Term: Able to use Dyspnea scale daily in rehab to express subjective sense of shortness of breath during exertion;Long Term: Able to use Dyspnea scale to guide intensity level when exercising independently Short Term: Able to use Dyspnea scale daily in rehab to express subjective sense of shortness of breath during exertion;Long Term:  Able to use Dyspnea scale to guide intensity level when exercising independently     Knowledge and understanding of Target Heart Rate Range (THRR) Yes Yes Yes     Intervention Provide education and explanation of THRR including how the numbers were predicted and where they are located for reference Provide education and explanation of THRR including how the numbers were predicted and where they are located for reference Provide education and explanation of THRR including how the numbers were predicted and where they are located for reference     Expected Outcomes Short Term:  Able to state/look up THRR;Long Term: Able to use THRR to govern intensity when exercising independently;Short Term: Able to use daily as guideline for intensity in rehab Short Term: Able to state/look up THRR;Long Term: Able to use THRR to govern intensity when exercising independently;Short Term: Able to use daily as guideline for intensity in rehab Short Term: Able to state/look up THRR;Long Term: Able to use THRR to govern intensity when exercising independently;Short Term: Able to use daily as guideline for intensity in rehab     Understanding of Exercise Prescription Yes Yes Yes     Intervention Provide education, explanation, and written materials on patient's individual exercise prescription Provide education, explanation, and written materials on patient's individual exercise prescription Provide education, explanation, and written materials on patient's individual exercise prescription     Expected Outcomes Short Term: Able to explain program exercise prescription;Long Term: Able to explain home exercise prescription to exercise independently Short Term: Able to explain program exercise prescription;Long Term: Able to explain home exercise prescription to exercise independently Short Term: Able to explain program exercise prescription;Long Term: Able to explain home exercise prescription to exercise independently               Exercise Goals Re-Evaluation :  Exercise Goals Re-Evaluation     Row Name 10/23/22 0801 11/16/22 1053           Exercise Goal Re-Evaluation   Exercise Goals Review Increase Physical Activity;Increase Strength and Stamina;Knowledge and understanding of Target Heart Rate Range (THRR);Able to understand and use rate of perceived exertion (RPE) scale;Able to understand and use Dyspnea scale;Understanding of Exercise Prescription Increase Physical Activity;Increase Strength and Stamina;Knowledge and understanding of Target Heart Rate Range (THRR);Able to understand and use rate of perceived exertion (RPE) scale;Able to understand and use Dyspnea scale;Understanding of Exercise Prescription      Comments Sebastian has completed 3 exercise sessions. He exercises for 15 min on the recumbent bike and Nustep. Jaime performs the warmup and cooldown standing without limitations. He is very deconditioned. It is too soon to notate any discernable progressions. Will continue to monitor and progress as able. Hampton has completed 7 exercise sessions. He exercises for 15 min on the recumbent bike and Nustep. He averages 2.3 METs at level 2 on the recumbent bike and 2.0 METs at level 3 on the Nustep. Cleotha performs the warmup and cooldown standing without limitations. He has increased his workloads for both exercise modes as he tolerated progressions well. Fontaine seems motivated to exercise. Will continue to monitor and progress as able.      Expected Outcomes Through exercise at rehab and home, the patient will decrease shortness of breath with daily activities and feel confident in carrying out an exercise regimen at home. Through exercise at rehab and home, the patient will decrease shortness of breath with daily activities and feel confident in carrying out an exercise regimen at home.               Discharge Exercise Prescription (Final Exercise Prescription Changes):  Exercise Prescription Changes -  11/17/22 1200       Response to Exercise   Blood Pressure (Admit) 98/64    Blood Pressure (Exercise) 112/59    Blood Pressure (Exit) 110/60    Heart Rate (Admit) 112 bpm    Heart Rate (Exercise) 104 bpm    Heart Rate (Exit) 102 bpm    Oxygen Saturation (Admit) 95 %    Oxygen Saturation (Exercise) 90 %   pt had to  be increased to NRB mask. Sats 90%   Oxygen Saturation (Exit) 97 %   O2 weaned to 6L   Rating of Perceived Exertion (Exercise) 13    Perceived Dyspnea (Exercise) 1    Duration Progress to 30 minutes of  aerobic without signs/symptoms of physical distress    Intensity THRR unchanged      Progression   Progression Continue to progress workloads to maintain intensity without signs/symptoms of physical distress.      Resistance Training   Training Prescription Yes    Weight red bands    Reps 10-15    Time 10 Minutes      Oxygen   Oxygen Continuous    Liters 100% NRB mask      Recumbant Bike   Level 2    Minutes 15    METs 2      NuStep   Level 3    SPM 80    Minutes 15    METs 2      Oxygen   Maintain Oxygen Saturation 88% or higher             Nutrition:  Target Goals: Understanding of nutrition guidelines, daily intake of sodium 1500mg , cholesterol 200mg , calories 30% from fat and 7% or less from saturated fats, daily to have 5 or more servings of fruits and vegetables.  Biometrics:    Nutrition Therapy Plan and Nutrition Goals:  Nutrition Therapy & Goals - 11/12/22 1151       Nutrition Therapy   Diet Heart healthy diet    Drug/Food Interactions Statins/Certain Fruits      Personal Nutrition Goals   Nutrition Goal Patient to reduce sodium intake 2300mg  per day    Personal Goal #2 Patient to improve diet quality by using the plate method as a daily guide for meal planning to include lean protein/plant protein, fruits, vegetables, whole grains, nonfat dairy as part of well balanced diet    Comments Goals in actoin. Osmin reports following  a low sodium diet and reading food labels for sodium. He does report 1 V8 drink daily to aid with vitamin/mineral intake and he understands the high sodium amount. He continues to weigh regularly and has follow-up with cardiology in April. He will meet with pallaitive care next week. Kaheem will continue to benefit from participation in pulmonary rehab for nutrition, exercise, and lifestyle modification.      Intervention Plan   Intervention Prescribe, educate and counsel regarding individualized specific dietary modifications aiming towards targeted core components such as weight, hypertension, lipid management, diabetes, heart failure and other comorbidities.;Nutrition handout(s) given to patient.    Expected Outcomes Short Term Goal: Understand basic principles of dietary content, such as calories, fat, sodium, cholesterol and nutrients.;Long Term Goal: Adherence to prescribed nutrition plan.             Nutrition Assessments:  Nutrition Assessments - 10/20/22 1219       Rate Your Plate Scores   Pre Score 62            MEDIFICTS Score Key: ?70 Need to make dietary changes  40-70 Heart Healthy Diet ? 40 Therapeutic Level Cholesterol Diet  Flowsheet Row PULMONARY REHAB OTHER RESPIRATORY from 10/20/2022 in [facility] for [facility], Vascular, & Lung [facility]  Picture Your Plate Total Score on Admission 62      Picture Your Plate Scores: <16 Unhealthy dietary pattern with much room for improvement. 41-50 Dietary pattern unlikely to meet recommendations for  good health and room for improvement. 51-60 More healthful dietary pattern, with some room for improvement.  >60 Healthy dietary pattern, although there may be some specific behaviors that could be improved.    Nutrition Goals Re-Evaluation:  Nutrition Goals Re-Evaluation     Row Name 10/15/22 1151 11/12/22 1151           Goals   Current Weight 168 lb 14 oz (76.6 kg) 171 lb 4.8 oz (77.7 kg)  he  reports plan to take lasix when he gets home.      Comment lipids WNL He is up 1.8# since starting with our program. Lipids WNL      Expected Outcome Patient with medical history of TIA, pulmonary hypertension, and congestive heart failure. Delshawn reports eating three meals daily and stable/normal appetite. His diet consist of cereal, sandwiches, soup, v8, etc. He does drink two beers daily. Jeremias will continue to benefit from participation in pulmonary rehab for nutrition, exercise, and lifestyle modification. Goals in actoin. Amiel reports following a low sodium diet and reading food labels for sodium. He does report 1 V8 drink daily to aid with vitamin/mineral intake and he understands the high sodium amount. He continues to weigh regularly and has follow-up with cardiology in April. He will meet with pallaitive care next week. Jaivyn will continue to benefit from participation in pulmonary rehab for nutrition, exercise, and lifestyle modification.               Nutrition Goals Discharge (Final Nutrition Goals Re-Evaluation):  Nutrition Goals Re-Evaluation - 11/12/22 1151       Goals   Current Weight 171 lb 4.8 oz (77.7 kg)   he reports plan to take lasix when he gets home.   Comment He is up 1.8# since starting with our program. Lipids WNL    Expected Outcome Goals in actoin. Wilburt reports following a low sodium diet and reading food labels for sodium. He does report 1 V8 drink daily to aid with vitamin/mineral intake and he understands the high sodium amount. He continues to weigh regularly and has follow-up with cardiology in April. He will meet with pallaitive care next week. Michaelangelo will continue to benefit from participation in pulmonary rehab for nutrition, exercise, and lifestyle modification.             Psychosocial: Target Goals: Acknowledge presence or absence of significant depression and/or stress, maximize coping skills, provide positive support system. Participant  is able to verbalize types and ability to use techniques and skills needed for reducing stress and depression.  Initial Review & Psychosocial Screening:  Initial Psych Review & Screening - 10/09/22 1328       Initial Review   Current issues with None Identified   meds have controlled sleep issues     Family Dynamics   Good Support System? Yes    Comments wife, daughter      Screening Interventions   Interventions Encouraged to exercise    Expected Outcomes Short Term goal: Utilizing psychosocial counselor, staff and physician to assist with identification of specific Stressors or current issues interfering with healing process. Setting desired goal for each stressor or current issue identified.;Long Term Goal: Stressors or current issues are controlled or eliminated.;Short Term goal: Identification and review with participant of any Quality of Life or Depression concerns found by scoring the questionnaire.;Long Term goal: The participant improves quality of Life and PHQ9 Scores as seen by post scores and/or verbalization of changes  Quality of Life Scores:  Scores of 19 and below usually indicate a poorer quality of life in these areas.  A difference of  2-3 points is a clinically meaningful difference.  A difference of 2-3 points in the total score of the Quality of Life Index has been associated with significant improvement in overall quality of life, self-image, physical symptoms, and general health in studies assessing change in quality of life.  PHQ-9: Review Flowsheet       10/09/2022  Depression screen PHQ 2/9  Decreased Interest 0  Down, Depressed, Hopeless 0  PHQ - 2 Score 0  Altered sleeping 0  Tired, decreased energy 0  Change in appetite 0  Feeling bad or failure about yourself  0  Trouble concentrating 0  Moving slowly or fidgety/restless 0  Suicidal thoughts 0  PHQ-9 Score 0  Difficult doing work/chores Not difficult at all   Interpretation of  Total Score  Total Score Depression Severity:  1-4 = Minimal depression, 5-9 = Mild depression, 10-14 = Moderate depression, 15-19 = Moderately severe depression, 20-27 = Severe depression   Psychosocial Evaluation and Intervention:  Psychosocial Evaluation - 10/09/22 1330       Psychosocial Evaluation & Interventions   Interventions Stress management education;Relaxation education;Encouraged to exercise with the program and follow exercise prescription    Expected Outcomes Pt to participate in PR    Continue Psychosocial Services  No Follow up required             Psychosocial Re-Evaluation:  Psychosocial Re-Evaluation     Row Name 10/21/22 1253 11/13/22 1026           Psychosocial Re-Evaluation   Current issues with None Identified Current Stress Concerns      Comments Jadden denies any psychosocial barriers at this time. Keland expressed that he really enjoys and loves the PR program. He stated that he knows that he doesn't have that much longer to live. 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 He acknowledges that everything is set up for when he passes. Ulyess informed staff that his family is aware of his wishes, and he is meeting with a palliative team next week. He stated that he has accepted this, but he is stressed about his wife having to take over paying bills, money management, etc. He stated that this is something he has done his whole life. 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 He stated that he would like to get back to mowing his grass on his zero-turn mower and golfing. He stated that his family is not ready to "let him go".      Expected Outcomes For Marwin to participate in PR free of psychosocial concerns For Cairo to participate in PR free of psychosocial concerns      Interventions Encouraged to attend Pulmonary Rehabilitation for the  exercise Encouraged to attend Pulmonary Rehabilitation for the exercise;Relaxation education      Continue Psychosocial Services  No Follow up required Follow up required by staff  We will continue to assess and monitor Johanna for any needs        Initial Review   Source of Stress Concerns -- Chronic Illness;Unable to participate in former interests or hobbies;Unable to perform yard/household activities               Psychosocial Discharge (Final Psychosocial Re-Evaluation):  Psychosocial Re-Evaluation - 11/13/22  1026       Psychosocial Re-Evaluation   Current issues with Current Stress Concerns    Comments Kamron expressed that he really enjoys and loves the PR program. He stated that he knows that he doesn't have that much longer to live. 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 He acknowledges that everything is set up for when he passes. Bion informed staff that his family is aware of his wishes, and he is meeting with a palliative team next week. He stated that he has accepted this, but he is stressed about his wife having to take over paying bills, money management, etc. He stated that this is something he has done his whole life. 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 He stated that he would like to get back to mowing his grass on his zero-turn mower and golfing. He stated that his family is not ready to "let him go".    Expected Outcomes For Vihan to participate in PR free of psychosocial concerns    Interventions Encouraged to attend Pulmonary Rehabilitation for the exercise;Relaxation education    Continue Psychosocial Services  Follow up required by staff   We will continue to assess and monitor Auther for any needs     Initial Review   Source of Stress Concerns Chronic Illness;Unable to participate in former interests or hobbies;Unable to perform  yard/household activities             Education: Education Goals: Education classes will be provided on a weekly basis, covering required topics. Participant will state understanding/return demonstration of topics presented.  Learning Barriers/Preferences:  Learning Barriers/Preferences - 10/09/22 1330       Learning Barriers/Preferences   Learning Barriers Exercise Concerns   hypoxia   Learning Preferences Audio;Verbal Instruction;Individual Instruction             Education Topics: Introduction to Pulmonary Rehab Group instruction provided by PowerPoint, verbal discussion, and written material to support subject matter. Instructor reviews what Pulmonary Rehab is, the purpose of the program, and how patients are referred.     Know Your Numbers Group instruction that is supported by a PowerPoint presentation. Instructor discusses importance of knowing and understanding resting, exercise, and post-exercise oxygen saturation, heart rate, and blood pressure. Oxygen saturation, heart rate, blood pressure, rating of perceived exertion, and dyspnea are reviewed along with a normal range for these values.    Exercise for the Pulmonary Patient Group instruction that is supported by a PowerPoint presentation. Instructor discusses benefits of exercise, core components of exercise, frequency, duration, and intensity of an exercise routine, importance of utilizing pulse oximetry during exercise, safety while exercising, and options of places to exercise outside of rehab.       MET Level  Group instruction provided by PowerPoint, verbal discussion, and written material to support subject matter. Instructor reviews what METs are and how to increase METs.    Pulmonary Medications Verbally interactive group education provided by instructor with focus on inhaled medications and proper administration. Flowsheet Row PULMONARY REHAB OTHER RESPIRATORY from 11/12/2022 in [facility] for Heart, Vascular, & Lung Health  Date 11/12/22  Educator RT  Instruction Review Code 1- Verbalizes Understanding       Anatomy and Physiology of the Respiratory System Group instruction provided by PowerPoint, verbal discussion, and written material to support subject matter. Instructor reviews respiratory cycle  and anatomical components of the respiratory system and their functions. Instructor also reviews differences in obstructive and restrictive respiratory diseases with examples of each.  Flowsheet Row PULMONARY REHAB OTHER RESPIRATORY from 10/22/2022 in [facility] for Heart, Vascular, & Lung Health  Date 10/22/22  Educator Ep  Instruction Review Code 1- Verbalizes Understanding       Oxygen Safety Group instruction provided by PowerPoint, verbal discussion, and written material to support subject matter. There is an overview of "What is Oxygen" and "Why do we need it".  Instructor also reviews how to create a safe environment for oxygen use, the importance of using oxygen as prescribed, and the risks of noncompliance. There is a brief discussion on traveling with oxygen and resources the patient may utilize.   Oxygen Use Group instruction provided by PowerPoint, verbal discussion, and written material to discuss how supplemental oxygen is prescribed and different types of oxygen supply systems. Resources for more information are provided.    Breathing Techniques Group instruction that is supported by demonstration and informational handouts. Instructor discusses the benefits of pursed lip and diaphragmatic breathing and detailed demonstration on how to perform both.     Risk Factor Reduction Group instruction that is supported by a PowerPoint presentation. Instructor discusses the definition of a risk factor, different risk factors for pulmonary disease, and how the heart and lungs work together.   MD Day A group question and answer  session with a medical doctor that allows participants to ask questions that relate to their pulmonary disease state.   Nutrition for the Pulmonary Patient Group instruction provided by PowerPoint slides, verbal discussion, and written materials to support subject matter. The instructor gives an explanation and review of healthy diet recommendations, which includes a discussion on weight management, recommendations for fruit and vegetable consumption, as well as protein, fluid, caffeine, fiber, sodium, sugar, and alcohol. Tips for eating when patients are short of breath are discussed.    Other Education Group or individual verbal, written, or video instructions that support the educational goals of the pulmonary rehab program.    Knowledge Questionnaire Score:  Knowledge Questionnaire Score - 10/09/22 1512       Knowledge Questionnaire Score   Pre Score 16/18             Core Components/Risk Factors/Patient Goals at Admission:  Personal Goals and Risk Factors at Admission - 10/09/22 1331       Core Components/Risk Factors/Patient Goals on Admission   Improve shortness of breath with ADL's Yes    Intervention Provide education, individualized exercise plan and daily activity instruction to help decrease symptoms of SOB with activities of daily living.    Expected Outcomes Short Term: Improve cardiorespiratory fitness to achieve a reduction of symptoms when performing ADLs;Long Term: Be able to perform more ADLs without symptoms or delay the onset of symptoms    Heart Failure Yes    Intervention Provide a combined exercise and nutrition program that is supplemented with education, support and counseling about heart failure. Directed toward relieving symptoms such as shortness of breath, decreased exercise tolerance, and extremity edema.    Expected Outcomes Improve functional capacity of life;Short term: Attendance in program 2-3 days a week with increased exercise capacity. Reported  lower sodium intake. Reported increased fruit and vegetable intake. Reports medication compliance.;Short term: Daily weights obtained and reported for increase. Utilizing diuretic protocols set by physician.;Long term: Adoption of self-care skills and reduction of barriers for early signs and symptoms  recognition and intervention leading to self-care maintenance.             Core Components/Risk Factors/Patient Goals Review:   Goals and Risk Factor Review     Row Name 10/21/22 1254 11/13/22 1057           Core Components/Risk Factors/Patient Goals Review   Personal Goals Review Develop more efficient breathing techniques such as purse lipped breathing and diaphragmatic breathing and practicing self-pacing with activity.;Increase knowledge of respiratory medications and ability to use respiratory devices properly.;Improve shortness of breath with ADL's Develop more efficient breathing techniques such as purse lipped breathing and diaphragmatic breathing and practicing self-pacing with activity.;Increase knowledge of respiratory medications and ability to use respiratory devices properly.;Improve shortness of breath with ADL's      Review Ellsworth has attended 2 PR classes so far. He is currently exercising on the recumbent bike and the Nustep. He has been able to increase his workload on the Nustep. He likes coming to class so far. He is practicing pursed lip breathing. His O2 had to be increased from 45%- 55% venti mask with sats 94-98% while exercising. We will continue to monitor Derrills progress throughout the program. Gionni has attended 7 PR classes so far. He is currently exercising on the recumbent bike and the Nustep. He has been able to increase his workload & METs on the recumbent bike and increase his workload on the Nustep. He states he "loves" the program and "it is helping". He is independent on initiating pursed lip breathing. His oxygen saturation has decreased from 45%/10L to 6L  HFNC. Onofre has correctly stated when to use her inhaler and has properly demonstrated it with our respiratory therapist. He is compliant with taking his medications for his diseases and has attended educational classes on anatomy and physiology of the respiratory system class and pulmonary medications class.      Expected Outcomes See admission goals See admission goals               Core Components/Risk Factors/Patient Goals at Discharge (Final Review):   Goals and Risk Factor Review - 11/13/22 1057       Core Components/Risk Factors/Patient Goals Review   Personal Goals Review Develop more efficient breathing techniques such as purse lipped breathing and diaphragmatic breathing and practicing self-pacing with activity.;Increase knowledge of respiratory medications and ability to use respiratory devices properly.;Improve shortness of breath with ADL's    Review Kelsie has attended 7 PR classes so far. He is currently exercising on the recumbent bike and the Nustep. He has been able to increase his workload & METs on the recumbent bike and increase his workload on the Nustep. He states he "loves" the program and "it is helping". He is independent on initiating pursed lip breathing. His oxygen saturation has decreased from 45%/10L to 6L HFNC. Julion has correctly stated when to use her inhaler and has properly demonstrated it with our respiratory therapist. He is compliant with taking his medications for his diseases and has attended educational classes on anatomy and physiology of the respiratory system class and pulmonary medications class.    Expected Outcomes See admission goals             ITP Comments:   Comments: Dr. Mechele Collin is Medical Director for Pulmonary Rehab at [facility].

## 2022-11-26 ENCOUNTER — Encounter (HOSPITAL_COMMUNITY): Payer: Medicare Other

## 2022-11-30 ENCOUNTER — Telehealth (HOSPITAL_COMMUNITY): Payer: Self-pay

## 2022-11-30 NOTE — Telephone Encounter (Signed)
Called pt to check on him. Pt stated he has completed his steroid and is almost done with the antibiotic. He hopes to return to PR on 3/18.

## 2022-12-01 ENCOUNTER — Encounter (HOSPITAL_COMMUNITY): Payer: Medicare Other

## 2022-12-03 ENCOUNTER — Encounter (HOSPITAL_COMMUNITY): Payer: Medicare Other

## 2022-12-04 ENCOUNTER — Ambulatory Visit: Payer: Medicare Other | Admitting: Adult Health

## 2022-12-04 ENCOUNTER — Ambulatory Visit (INDEPENDENT_AMBULATORY_CARE_PROVIDER_SITE_OTHER): Payer: Medicare Other

## 2022-12-04 ENCOUNTER — Encounter: Payer: Self-pay | Admitting: Adult Health

## 2022-12-04 VITALS — BP 116/60 | HR 82 | Temp 97.4°F | Ht 66.0 in | Wt 170.6 lb

## 2022-12-04 DIAGNOSIS — J690 Pneumonitis due to inhalation of food and vomit: Secondary | ICD-10-CM

## 2022-12-04 DIAGNOSIS — R1319 Other dysphagia: Secondary | ICD-10-CM | POA: Diagnosis not present

## 2022-12-04 DIAGNOSIS — J189 Pneumonia, unspecified organism: Secondary | ICD-10-CM | POA: Insufficient documentation

## 2022-12-04 DIAGNOSIS — J441 Chronic obstructive pulmonary disease with (acute) exacerbation: Secondary | ICD-10-CM | POA: Diagnosis not present

## 2022-12-04 NOTE — Assessment & Plan Note (Signed)
Aspiration precautions 

## 2022-12-04 NOTE — Assessment & Plan Note (Signed)
Recent slow to resolve COPD exacerbation with associated multifocal pneumonia.  Clinically patient has improved after prolonged course of antibiotics and steroids.  Appears to be nearing his baseline.  Will continue on Anoro daily along with DuoNeb twice daily.  Continue on oxygen to maintain O2 saturations greater than 88 to 90%.  Patient is ready to go back to pulmonary rehab.  Oxygen levels have returned back to baseline.  Goal is to maintain O2 saturations greater than 88 to 9%  Plan Patient Instructions  Set up CT chest in 6 weeks to follow multifocal pneumonia Aspiration precautions as discussed.  Continue on ANORO 1 puff daily .  Albuterol inhaler or Duoneb As needed   Chest xray today .  May return to pulmonary rehab.  Continue on Oxygen 2l/m rest , 5-6 l/m walking, to keep O2 sats.> 88-90%  Follow up with Dr. Judeth Horn in 6 weeks after CT chest and As needed

## 2022-12-04 NOTE — Assessment & Plan Note (Signed)
Multifocal pneumonia-patient has completed a extended course of antibiotics -clinically has improved.  Chest x-ray today Check CT chest in 6 weeks.  Plan  Patient Instructions  Set up CT chest in 6 weeks to follow multifocal pneumonia Aspiration precautions as discussed.  Continue on ANORO 1 puff daily .  Albuterol inhaler or Duoneb As needed   Chest xray today .  May return to pulmonary rehab.  Continue on Oxygen 2l/m rest , 5-6 l/m walking, to keep O2 sats.> 88-90%  Follow up with Dr. Judeth Horn in 6 weeks after CT chest and As needed

## 2022-12-04 NOTE — Patient Instructions (Addendum)
Set up CT chest in 6 weeks to follow multifocal pneumonia Aspiration precautions as discussed.  Continue on ANORO 1 puff daily .  Albuterol inhaler or Duoneb As needed   Chest xray today .  May return to pulmonary rehab.  Continue on Oxygen 2l/m rest , 5-6 l/m walking, to keep O2 sats.> 88-90%  Follow up with Dr. Judeth Horn in 6 weeks after CT chest and As needed

## 2022-12-04 NOTE — Progress Notes (Signed)
@Patient  ID: Frank Meyer, male    DOB: [birth year], 82 y.o.   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  Medical history significant for diastolic heart failure  TEST/EVENTS :  Hospitalization January 2024 for COPD flare  and March for acute on chronic respiratory failure decompensated heart failure and COPD exacerbation with pneumonia  12/04/2022 Follow up : COPD , O2RF  Patient returns for 2-week follow-up.  Patient was seen last visit for a COPD exacerbation and increased oxygen demands.  He was treated with a 2-week course of Augmentin and a prednisone taper. Finished both yesterday. He is feeling much better.  Cough and congestion have decreased.  Also shortness of breath is more manageable.  Patient says he is typically on oxygen 2 L at rest and 5 to 6 L with walking.  At pulmonary rehab he had to go up to 15 L with heavy exercise.  Last visit patient was having to use up to 8 L but now is back down to his 5 to 6 L with walking.  Patient remains on Anoro daily.  Uses DuoNeb nebulizer twice daily.  Has diastolic heart failure with lower extremity swelling.  Says that his swelling has been under good control.  He is on Lasix 40 mg every other day.  Patient was hospitalized in January for COPD exacerbation and again in March 2024 for acute on chronic respiratory failure with decompensated heart failure and COPD with pneumonia.  CT chest October 27, 2022 showed severe emphysema.  With increased groundglass and consolidation in the lung bases and right upper lobe.  Patient does have some intermittent dysphagia.  Discussed aspiration precautions in detail with patient. Today in the office patient is on oxygen 1 to 2 L at rest with O2 saturations at 94%.  No Known Allergies  Immunization History   Administered Date(s) Administered   DTaP, 5 pertussis antigens 02/17/2016   Fluad Quad(high Dose 65+) 04/29/2019, 04/20/2020   Influenza Split 05/10/2012, 05/20/2013, 08/17/2013   Influenza, High Dose Seasonal PF 05/24/2017, 06/03/2018   Influenza-Unspecified 06/01/2015, 06/20/2016, 06/04/2018, 04/27/2019   PFIZER(Purple Top)SARS-COV-2 Vaccination 09/25/2019, 10/20/2019, 04/03/2020, 05/14/2020   Pneumococcal Conjugate-13 07/20/2014   Pneumococcal Polysaccharide-23 12/17/2009, 01/13/2013, 01/23/2015   Td (Adult),5 Lf Tetanus Toxid, Preservative Free 10/02/2004   Zoster, Live 01/05/2012, 01/12/2012    Past Medical History:  Diagnosis Date   AKI (acute kidney injury) 08/03/2022   Allergic rhinitis 10/24/2009   Altered bowel habits 10/04/2020   Basal cell carcinoma    Benign prostatic hyperplasia with lower urinary tract symptoms 10/24/2009   Benign prostatic hypertrophy    Bradycardia 12/06/2019   Callosity 10/16/2013   Cardiomegaly 01/23/2015   Chronic diastolic heart failure 12/19/2018   Chronic respiratory failure 10/04/2020   Coronary artery calcification seen on CAT scan 05/28/2020   Dyslipidemia    Dyspnea on exertion 03/18/2018   Elevated troponin 08/03/2022   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   Encounter for general adult medical examination without abnormal findings 01/17/2015   Epistaxis 09/18/2016   Ex-cigarette smoker 01/20/2021   Exudative age-related macular degeneration of left eye with active choroidal neovascularization 12/18/2019   Gallbladder problem    Gastroesophageal reflux disease    Hiatal hernia    Hilar adenopathy 03/25/2018   History of seizures 08/03/2022   History of smoking 03/18/2018   History  of transient ischemic attack (TIA) 10/24/2009   Hyperlipidemia 10/24/2009   Insomnia 05/09/2013   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   Internal hemorrhoid 10/04/2020   Macular degeneration    Macular  pucker, right eye 04/30/2020   Mediastinal adenopathy 03/25/2018   Memory loss 05/09/2013   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   Mild memory disturbance    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   Noninfective gastroenteritis and colitis, unspecified 10/04/2020   Nuclear sclerotic cataract of right eye 12/18/2019   Other specified symptoms and signs involving the digestive system and abdomen 10/04/2020   Overweight 09/14/2018   Oxygen dependent 10/04/2020   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   Personal history of colonic polyps 10/04/2020   Posterior vitreous detachment of right eye 12/18/2019   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   Pulmonary hypertension    Right upper lobe pulmonary nodule 03/25/2018   Syncope and collapse 12/12/2018   TIA (transient ischemic attack)    Transient ischemic attack 10/24/2009   Vasomotor rhinitis 09/18/2016   Weight decreased 10/04/2020    Tobacco History: Social History   Tobacco Use  Smoking Status Former   Packs/day: 2.00   Years: 25.00   Additional pack years: 0.00   Total pack years: 50.00   Types: Cigarettes   Quit date: 35   Years since quitting: 30.3  Smokeless Tobacco Never   Counseling given: Not Answered   Outpatient Medications Prior to Visit  Medication Sig Dispense Refill   ANORO ELLIPTA 62.5-25 MCG/ACT AEPB USE 1 INHALATION BY MOUTH DAILY 180 each 3   aspirin 81 MG EC tablet Take 81 mg by mouth daily.     atorvastatin (LIPITOR) 20 MG tablet Take 20 mg by mouth every evening.     cholecalciferol (VITAMIN D) 1000 UNITS tablet Take 1,000 Units by mouth daily.     donepezil (ARICEPT) 10 MG tablet Take 1 tablet (10 mg total) by mouth daily. 90 tablet 3   ENTRESTO 24-26 MG TAKE 1 TABLET BY MOUTH DAILY 100 tablet 2   fexofenadine (ALLEGRA) 180 MG tablet Take 180 mg by mouth daily.     finasteride (PROSCAR) 5 MG tablet Take 5 mg by mouth at bedtime.     furosemide (LASIX) 40 MG tablet Take 1  tablet (40 mg total) by mouth every other day. 30 tablet 0   ipratropium (ATROVENT) 0.06 % nasal spray Place 1-2 sprays into both nostrils See admin instructions. 2 sprays in the right nostril and 1 spray in the left nostril once daily     ipratropium-albuterol (DUONEB) 0.5-2.5 (3) MG/3ML SOLN Take 3 mLs by nebulization 2 (two) times daily. 540 mL 3   Melatonin 10 MG TABS Take 20 mg by mouth at bedtime.     omeprazole (PRILOSEC) 40 MG capsule Take 40 mg by mouth daily.      OXYGEN Inhale 2-5 L into the lungs as directed. 5lt during the day and 2lt at night     thiamine 100 MG tablet Take 1 tablet (100 mg total) by mouth daily. 30 tablet 0   traZODone (DESYREL) 100 MG tablet TAKE 2 TABLETS BY MOUTH AT  BEDTIME 180 tablet 3   levETIRAcetam (KEPPRA) 250 MG tablet Take 1-2 tablets (250-500 mg total) by mouth See admin instructions. Take 250 mg by mouth in the morning and 500 mg by mouth at night. (Patient taking differently: Take 250-500 mg by mouth See admin instructions. Take one tablet (250 mg) by mouth  in the morning and two tablets (500 mg) by mouth at night.) 270 tablet 3   nitroGLYCERIN (NITROSTAT) 0.4 MG SL tablet Place 1 tablet (0.4 mg total) under the tongue every 5 (five) minutes as needed. Chest pain (Patient taking differently: Place 0.4 mg under the tongue every 5 (five) minutes as needed for chest pain. Chest pain) 25 tablet 6   amoxicillin-clavulanate (AUGMENTIN) 875-125 MG tablet Take 1 tablet by mouth 2 (two) times daily. (Patient not taking: Reported on 12/04/2022) 28 tablet 0   No facility-administered medications prior to visit.     Review of Systems:   Constitutional:   No  weight loss, night sweats,  Fevers, chills, + fatigue, or  lassitude.  HEENT:   No headaches,  Difficulty swallowing,  Tooth/dental problems, or  Sore throat,                No sneezing, itching, ear ache, nasal congestion, post nasal drip,   CV:  No chest pain,  Orthopnea, PND, swelling in lower  extremities, anasarca, dizziness, palpitations, syncope.   GI  No heartburn, indigestion, abdominal pain, nausea, vomiting, diarrhea, change in bowel habits, loss of appetite, bloody stools.   Resp:   No chest wall deformity  Skin: no rash or lesions.  GU: no dysuria, change in color of urine, no urgency or frequency.  No flank pain, no hematuria   MS:  No joint pain or swelling.  No decreased range of motion.  No back pain.    Physical Exam  BP 116/60 (BP Location: Left Arm, Patient Position: Sitting, Cuff Size: Normal)   Pulse 82   Temp (!) 97.4 F (36.3 C) (Oral)   Ht  (1.676 m)   Wt 170 lb 9.6 oz (77.4 kg)   SpO2 90%   BMI 27.54 kg/m   GEN: A/Ox3; pleasant , NAD, frail, elderly, on oxygen   HEENT:  [place]/AT,  NOSE-clear, THROAT-clear, no lesions, no postnasal drip or exudate noted.   NECK:  Supple w/ fair ROM; no JVD; normal carotid impulses w/o bruits; no thyromegaly or nodules palpated; no lymphadenopathy.    RESP decreased breath sounds in the bases  no accessory muscle use, no dullness to percussion  CARD:  RRR, no m/r/g, tr peripheral edema, pulses intact, no cyanosis or clubbing.  GI:   Soft & nt; nml bowel sounds; no organomegaly or masses detected.   Musco: Warm bil, no deformities or joint swelling noted.   Neuro: alert, no focal deficits noted.    Skin: Warm, no lesions or rashes    Lab Results:      BNP  Imaging:  Bevacizumab (AVASTIN) SOLN 1.25 mg     Date Action Dose Route User   11/11/2022 1251 Given 1.25 mg Intravitreal (Left Eye) Rennis Chris, MD          Latest Ref Rng & Units 06/26/2020    3:50 PM 04/27/2018    8:48 AM  PFT Results  FVC-Pre L 3.75  4.11   FVC-Predicted Pre % 111  119   FVC-Post L 3.85  4.07   FVC-Predicted Post % 113  118   Pre FEV1/FVC % % 78  79   Post FEV1/FCV % % 78  78   FEV1-Pre L 2.92  3.23   FEV1-Predicted Pre % 122  131   FEV1-Post L 3.02  3.20   DLCO uncorrected ml/min/mmHg 9.44  11.81    DLCO UNC% % 44  44   DLCO corrected ml/min/mmHg 9.61  11.92   DLCO COR %Predicted % 44  44   DLVA Predicted % 45  49   TLC L 5.75  5.24   TLC % Predicted % 92  84   RV % Predicted % 87  51     No results found for: "NITRICOXIDE"      Assessment & Plan:   COPD exacerbation (HCC) Recent slow to resolve COPD exacerbation with associated multifocal pneumonia.  Clinically patient has improved after prolonged course of antibiotics and steroids.  Appears to be nearing his baseline.  Will continue on Anoro daily along with DuoNeb twice daily.  Continue on oxygen to maintain O2 saturations greater than 88 to 90%.  Patient is ready to go back to pulmonary rehab.  Oxygen levels have returned back to baseline.  Goal is to maintain O2 saturations greater than 88 to 9%  Plan Patient Instructions  Set up CT chest in 6 weeks to follow multifocal pneumonia Aspiration precautions as discussed.  Continue on ANORO 1 puff daily .  Albuterol inhaler or Duoneb As needed   Chest xray today .  May return to pulmonary rehab.  Continue on Oxygen 2l/m rest , 5-6 l/m walking, to keep O2 sats.> 88-90%  Follow up with Dr. Judeth Horn in 6 weeks after CT chest and As needed       Multifocal pneumonia Multifocal pneumonia-patient has completed a extended course of antibiotics -clinically has improved.  Chest x-ray today Check CT chest in 6 weeks.  Plan  Patient Instructions  Set up CT chest in 6 weeks to follow multifocal pneumonia Aspiration precautions as discussed.  Continue on ANORO 1 puff daily .  Albuterol inhaler or Duoneb As needed   Chest xray today .  May return to pulmonary rehab.  Continue on Oxygen 2l/m rest , 5-6 l/m walking, to keep O2 sats.> 88-90%  Follow up with Dr. Judeth Horn in 6 weeks after CT chest and As needed       Dysphagia Aspiration precautions     Aastha Dayley, NP 12/04/2022

## 2022-12-08 ENCOUNTER — Encounter (HOSPITAL_COMMUNITY)
Admission: RE | Admit: 2022-12-08 | Discharge: 2022-12-08 | Disposition: A | Discharge status: Home / Self Care | Payer: Medicare Other | Source: Ambulatory Visit | Attending: Pulmonary Disease | Admitting: Pulmonary Disease

## 2022-12-08 ENCOUNTER — Telehealth (HOSPITAL_COMMUNITY): Payer: Self-pay

## 2022-12-08 DIAGNOSIS — I5032 Chronic diastolic (congestive) heart failure: Secondary | ICD-10-CM | POA: Diagnosis not present

## 2022-12-08 NOTE — Addendum Note (Signed)
Addended by: Delrae Rend on: 12/08/2022 03:34 PM   Modules accepted: Orders

## 2022-12-08 NOTE — Progress Notes (Signed)
Daily Session Note  Patient Details  Name: Frank Meyer MRN: [id number] Date of Birth: 11-16-1940 Referring Provider:   Doristine Devoid Pulmonary Rehab Walk Test from 10/09/2022 in [facility] for [facility], Vascular, [facility] Lung [facility]  Referring Provider Hunsucker       Encounter Date: 12/08/2022  Check In:  Session Check In - 12/08/22 1142       Check-In   Supervising physician immediately available to respond to emergencies [facility] MD immediately available    Physician(s) Eligha Bridegroom, NP    Location [facility]    Staff Present Samantha Belarus, RD, Dutch Gray, RN, BSN;Randi Reeve BS, ACSM-CEP, Exercise Physiologist;Kaylee Earlene Plater, MS, ACSM-CEP, Exercise Physiologist;Hana Trippett Katrinka Blazing, RT    Virtual Visit No    Medication changes reported     No    Fall or balance concerns reported    No    Tobacco Cessation No Change    Warm-up and Cool-down Performed as group-led instruction    Resistance Training Performed Yes    VAD Patient? No    PAD/SET Patient? No      Pain Assessment   Currently in Pain? No/denies    Multiple Pain Sites No             Capillary Blood Glucose: No results found for this or any previous visit (from the past 24 hour(s)).    Social History   Tobacco Use  Smoking Status Former   Packs/day: 2.00   Years: 25.00   Additional pack years: 0.00   Total pack years: 50.00   Types: Cigarettes   Quit date: 54   Years since quitting: 30.3  Smokeless Tobacco Never    Goals Met:  Proper associated with RPD/PD & O2 Sat Independence with exercise equipment Exercise tolerated well No report of concerns or symptoms today Strength training completed today  Goals Unmet:  Not Applicable  Comments: Service time is from 1016 to 1150.    Dr. Mechele Collin is Medical Director for Pulmonary Rehab at [facility].

## 2022-12-08 NOTE — Telephone Encounter (Signed)
-----   Message from Karren Burly, MD sent at 12/07/2022  5:07 PM EDT ----- Ok with me for him to come back - using 8-10 L with exertion per his report. ----- Message ----- From: Guss Bunde, RRT Sent: 12/02/2022   1:37 PM EDT To: Karren Burly, MD  Dr. Judeth Horn, I received a call from Kden stating that he went to his primary today and they told him they were okay with him starting back PR, but Dr. Lanora Manis note states he needs to be seen in the office first to be cleared to come back. He is frustrated that his appt is on 4/29 and wants to come back to PR sooner. Does he need an OV to be cleared to come back?

## 2022-12-10 ENCOUNTER — Encounter (HOSPITAL_COMMUNITY)
Admission: RE | Admit: 2022-12-10 | Discharge: 2022-12-10 | Disposition: A | Discharge status: Home / Self Care | Payer: Medicare Other | Source: Ambulatory Visit | Attending: Pulmonary Disease | Admitting: Pulmonary Disease

## 2022-12-10 DIAGNOSIS — I5032 Chronic diastolic (congestive) heart failure: Secondary | ICD-10-CM | POA: Diagnosis not present

## 2022-12-10 NOTE — Progress Notes (Signed)
Home Exercise Prescription I have reviewed a Home Exercise Prescription with Morrison Costella Hatcher. Dawit is not currently exercising at home. I encouraged him to try exercising at a fitness center at least 1 non-rehab day/wk for 30 min/day. Caeleb stated that he has 2 fitness centers that he could go to which include [employer] and [employer]. I recommended going to the [facility] because they are more likely to have similar equipment to rehab. Rex will check out both fitness centers and report back to me. Brace seems motivated to exercise. The patient stated that their goals were to try to get back to playing golf. We reviewed exercise guidelines, target heart rate during exercise, RPE Scale, weather conditions, endpoints for exercise, warmup and cool down. The patient is encouraged to come to me with any questions. I will continue to follow up with the patient to assist them with progression and safety. Spent 15 min with patient discussing home exercise plan and goals  Joya San, MS, ACSM-CEP 12/10/2022 3:28 PM

## 2022-12-10 NOTE — Progress Notes (Signed)
Called and spoke with patient, advised of results/recommendations per Tammy Parrett NP.  He verbalized understanding.  Nothing further needed.

## 2022-12-10 NOTE — Progress Notes (Signed)
Daily Session Note  Patient Details  Name: Frank Meyer MRN: [id number] Date of Birth: [birth year] Referring Provider:   Doristine Devoid Pulmonary Rehab Walk Test from 10/09/2022 in [facility] for [facility], Vascular, & Lung Health  Referring Provider Hunsucker       Encounter Date: 12/10/2022  Check In:  Session Check In - 12/10/22 1217       Check-In   Supervising physician immediately available to respond to emergencies [facility] MD immediately available    Physician(s) Bernadene Person, NP    Location [facility]    Staff Present Samantha Belarus, RD, Dutch Gray, RN, BSN;Randi Reeve BS, ACSM-CEP, Exercise Physiologist;Kaylee Earlene Plater, MS, ACSM-CEP, Exercise Physiologist;Abra Lingenfelter Katrinka Blazing, RT    Virtual Visit No    Medication changes reported     No    Fall or balance concerns reported    No    Tobacco Cessation No Change    Warm-up and Cool-down Performed as group-led instruction    Resistance Training Performed Yes    VAD Patient? No    PAD/SET Patient? No      Pain Assessment   Currently in Pain? No/denies    Pain Score 0-No pain    Multiple Pain Sites No             Capillary Blood Glucose: No results found for this or any previous visit (from the past 24 hour(s)).    Social History   Tobacco Use  Smoking Status Former   Packs/day: 2.00   Years: 25.00   Additional pack years: 0.00   Total pack years: 50.00   Types: Cigarettes   Quit date: 10   Years since quitting: 30.3  Smokeless Tobacco Never    Goals Met:  Proper associated with RPD/PD & O2 Sat Independence with exercise equipment Exercise tolerated well No report of concerns or symptoms today Strength training completed today  Goals Unmet:  Not Applicable  Comments: Service time is from 1007 to 1150.    Dr. Mechele Collin is Medical Director for Pulmonary Rehab at [facility].

## 2022-12-14 ENCOUNTER — Ambulatory Visit: Payer: Medicare Other | Admitting: Pulmonary Disease

## 2022-12-15 ENCOUNTER — Ambulatory Visit: Payer: Medicare Other | Attending: Cardiology | Admitting: Cardiology

## 2022-12-15 ENCOUNTER — Encounter: Payer: Self-pay | Admitting: Cardiology

## 2022-12-15 ENCOUNTER — Encounter (HOSPITAL_COMMUNITY)
Admission: RE | Admit: 2022-12-15 | Discharge: 2022-12-15 | Disposition: A | Discharge status: Home / Self Care | Payer: Medicare Other | Source: Ambulatory Visit | Attending: Pulmonary Disease | Admitting: Pulmonary Disease

## 2022-12-15 ENCOUNTER — Ambulatory Visit: Payer: Medicare Other | Admitting: Cardiology

## 2022-12-15 VITALS — BP 90/62 | HR 105 | Ht 66.0 in | Wt 171.0 lb

## 2022-12-15 VITALS — Wt 170.6 lb

## 2022-12-15 DIAGNOSIS — E785 Hyperlipidemia, unspecified: Secondary | ICD-10-CM | POA: Diagnosis not present

## 2022-12-15 DIAGNOSIS — I5032 Chronic diastolic (congestive) heart failure: Secondary | ICD-10-CM

## 2022-12-15 DIAGNOSIS — J441 Chronic obstructive pulmonary disease with (acute) exacerbation: Secondary | ICD-10-CM

## 2022-12-15 DIAGNOSIS — J431 Panlobular emphysema: Secondary | ICD-10-CM | POA: Diagnosis not present

## 2022-12-15 NOTE — Progress Notes (Unsigned)
Cardiology Office Note:    Date:  12/15/2022   ID:  Frank Meyer, DOB January 29, 1941, MRN [id number]  PCP:  Geoffry Paradise, MD  Cardiologist:  Gypsy Balsam, MD    Referring MD: Geoffry Paradise, MD   Chief Complaint  Patient presents with   Follow-up  Doing fine just short of breath  History of Present Illness:    Frank Meyer is a 82 y.o. 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 In 2021 he did have evaluation for coronary artery disease. There were some lesions borderline however fractional flow reserve was negative.  Recently left in the hospital with respiratory distress.  Being discharged home.  On high oxygen flow.  Seems doing quite okay goes to rehab pulmonary rehab on the regular basis like this but any effort will bring oxygen down.  Actually he Used 10 L oxygen from to go from a car to our office.  Described to have some atypical chest pain that happen when he sits in the chair does not happen when he do his pulmonary rehab  Past Medical History:  Diagnosis Date   AKI (acute kidney injury) (HCC) 08/03/2022   Allergic rhinitis 10/24/2009   Altered bowel habits 10/04/2020   Basal cell carcinoma    Benign prostatic hyperplasia with lower urinary tract symptoms 10/24/2009   Benign prostatic hypertrophy    Bradycardia 12/06/2019   Callosity 10/16/2013   Cardiomegaly 01/23/2015   Chronic diastolic heart failure (HCC) 12/19/2018   Chronic respiratory failure (HCC) 10/04/2020   Coronary artery calcification  seen on CAT scan 05/28/2020   Dyslipidemia    Dyspnea on exertion 03/18/2018   Elevated troponin 08/03/2022   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   Encounter for general adult medical examination without abnormal findings 01/17/2015   Epistaxis 09/18/2016   Ex-cigarette smoker 01/20/2021   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   Gallbladder problem    Gastroesophageal reflux disease    Hiatal hernia    Hilar adenopathy 03/25/2018   History of seizures 08/03/2022   History of smoking 03/18/2018   History of transient ischemic attack (TIA) 10/24/2009   Hyperlipidemia 10/24/2009   Insomnia 05/09/2013   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   Internal hemorrhoid 10/04/2020   Macular degeneration    Macular pucker, right eye 04/30/2020   Mediastinal adenopathy 03/25/2018   Memory loss 05/09/2013   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   Mild memory disturbance    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   Noninfective gastroenteritis and colitis, unspecified 10/04/2020   Nuclear sclerotic cataract of right eye 12/18/2019   Other specified symptoms and signs involving the digestive system and abdomen 10/04/2020   Overweight 09/14/2018   Oxygen dependent 10/04/2020   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   Personal history of colonic polyps 10/04/2020   Posterior vitreous detachment of right eye 12/18/2019   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   Pulmonary hypertension (HCC)    Right upper lobe pulmonary nodule 03/25/2018   Syncope and collapse 12/12/2018   TIA (transient  ischemic attack)    Transient ischemic attack 10/24/2009   Vasomotor rhinitis 09/18/2016   Weight decreased 10/04/2020    Past Surgical History:  Procedure Laterality Date   CHOLECYSTECTOMY     none      Current Medications: Current Meds  Medication Sig    ANORO ELLIPTA 62.5-25 MCG/ACT AEPB USE 1 INHALATION BY MOUTH DAILY (Patient taking differently: Inhale 1 puff into the lungs daily.)   aspirin 81 MG EC tablet Take 81 mg by mouth daily.   atorvastatin (LIPITOR) 20 MG tablet Take 20 mg by mouth every evening.   cholecalciferol (VITAMIN D) 1000 UNITS tablet Take 1,000 Units by mouth daily.   donepezil (ARICEPT) 10 MG tablet Take 1 tablet (10 mg total) by mouth daily.   ENTRESTO 24-26 MG TAKE 1 TABLET BY MOUTH DAILY   fexofenadine (ALLEGRA) 180 MG tablet Take 180 mg by mouth daily.   finasteride (PROSCAR) 5 MG tablet Take 5 mg by mouth at bedtime.   furosemide (LASIX) 40 MG tablet Take 1 tablet (40 mg total) by mouth every other day.   ipratropium (ATROVENT) 0.06 % nasal spray Place 1-2 sprays into both nostrils See admin instructions. 2 sprays in the right nostril and 1 spray in the left nostril once daily   ipratropium-albuterol (DUONEB) 0.5-2.5 (3) MG/3ML SOLN Take 3 mLs by nebulization 2 (two) times daily.   levETIRAcetam (KEPPRA) 250 MG tablet Take 1-2 tablets (250-500 mg total) by mouth See admin instructions. Take 250 mg by mouth in the morning and 500 mg by mouth at night. (Patient taking differently: Take 250-500 mg by mouth See admin instructions. Take one tablet (250 mg) by mouth in the morning and two tablets (500 mg) by mouth at night.)   Melatonin 10 MG TABS Take 20 mg by mouth at bedtime.   nitroGLYCERIN (NITROSTAT) 0.4 MG SL tablet Place 1 tablet (0.4 mg total) under the tongue every 5 (five) minutes as needed. Chest pain (Patient taking differently: Place 0.4 mg under the tongue every 5 (five) minutes as needed for chest pain. Chest pain)   omeprazole (PRILOSEC) 40 MG capsule Take 40 mg by mouth daily.    OXYGEN Inhale 2-5 L into the lungs as directed. 5lt during the day and 2lt at night   thiamine 100 MG tablet Take 1 tablet (100 mg total) by mouth daily.   traZODone (DESYREL) 100 MG tablet TAKE 2 TABLETS BY MOUTH AT  BEDTIME      Allergies:   Patient has no known allergies.   Social History   Socioeconomic History   Marital status: Married    Spouse name: pamela   Number of children: 2   Years of education: college   Highest education level: Not on file  Occupational History   Occupation: retired  Tobacco Use   Smoking status: Former    Packs/day: 2.00    Years: 25.00    Additional pack years: 0.00    Total pack years: 50.00    Types: Cigarettes    Quit date: 1994    Years since quitting: 30.3   Smokeless tobacco: Never  Substance and Sexual Activity   Alcohol use: Yes    Alcohol/week: 15.0 - 20.0 standard drinks of alcohol    Types: 15 - 20 Cans of beer per week   Drug use: Never   Sexual activity: Not Currently  Other Topics Concern   Not on file  Social History Narrative   Pt is retired. He is married. The pt  lives with his wife. He has a Chiropodist. The pt has 2 children.      Patient drinks 2-3 cups of caffeine daily.   Patient is right handed.    Social Determinants of Health   Financial Resource Strain: Not on file  Food Insecurity: No Food Insecurity (10/28/2022)   Hunger Vital Sign    Worried About Running Out of Food in the Last Year: Never true    Ran Out of Food in the Last Year: Never true  Transportation Needs: No Transportation Needs (10/28/2022)   PRAPARE - Administrator, Civil Service (Medical): No    Lack of Transportation (Non-Medical): No  Physical Activity: Not on file  Stress: Not on file  Social Connections: Not on file     Family History: The patient's family history includes Memory loss in his mother; Obesity in his mother. ROS:   Please see the history of present illness.    All 14 point review of systems negative except as described per history of present illness  EKGs/Labs/Other Studies Reviewed:      Recent Labs: 09/10/2022: ALT 20 10/27/2022: B Natriuretic Peptide 125.4 10/30/2022: BUN 22; Creatinine, Ser 1.03; Hemoglobin 10.9;  Magnesium 2.4; Platelets 237; Potassium 4.1; Sodium 137  Recent Lipid Panel    Component Value Date/Time   CHOL 147 06/13/2020 0819   TRIG 129 06/13/2020 0819   HDL 57 06/13/2020 0819   CHOLHDL 2.6 06/13/2020 0819   LDLCALC 67 06/13/2020 0819    Physical Exam:    VS:  BP 90/62 (BP Location: Left Arm, Patient Position: Sitting)   Pulse (!) 105   Ht 5\' 6"  (1.676 m)   Wt 171 lb (77.6 kg)   SpO2 91%   BMI 27.60 kg/m     Wt Readings from Last 3 Encounters:  12/15/22 171 lb (77.6 kg)  12/15/22 170 lb 10.2 oz (77.4 kg)  12/04/22 170 lb 9.6 oz (77.4 kg)     GEN:  Well nourished, well developed in no acute distress HEENT: Normal NECK: No JVD; No carotid bruits LYMPHATICS: No lymphadenopathy CARDIAC: RRR, no murmurs, no rubs, no gallops RESPIRATORY:  Clear to auscultation without rales, wheezing or rhonchi  ABDOMEN: Soft, non-tender, non-distended MUSCULOSKELETAL:  No edema; No deformity  SKIN: Warm and dry LOWER EXTREMITIES: no swelling NEUROLOGIC:  Alert and oriented x 3 PSYCHIATRIC:  Normal affect   ASSESSMENT:    1. COPD exacerbation (HCC)   2. Panlobular emphysema (HCC)   3. Dyslipidemia   4. Chronic diastolic CHF (congestive heart failure) (HCC)    PLAN:    In order of problems listed above:  Advanced COPD with high oxygen requirement, followed by pulmonary as well as antimedicine team. Dyslipidemia I did review K PN which show me data from September 2023 LDL 76 HDL 43.  Continue present management. Chronic congestive heart failure looks compensated on physical exam.  Will continue present management. I did review record from hospital for this visit   Medication Adjustments/Labs and Tests Ordered: Current medicines are reviewed at length with the patient today.  Concerns regarding medicines are outlined above.  No orders of the defined types were placed in this encounter.  Medication changes: No orders of the defined types were placed in this  encounter.   Signed, Georgeanna Lea, MD, [facility] 12/15/2022 2:54 PM    [facility]

## 2022-12-15 NOTE — Progress Notes (Signed)
Daily Session Note  Patient Details  Name: Frank Meyer MRN: [id number] Date of Birth: 02-17-1941 Referring Provider:   Doristine Devoid Pulmonary Rehab Walk Test from 10/09/2022 in [facility]  Referring Provider Hunsucker       Encounter Date: 12/15/2022  Check In:  Session Check In - 12/15/22 1130       Check-In   Supervising physician immediately available to respond to emergencies [facility] MD immediately available    Physician(s) Robin Searing, NP    Location [facility]    Staff Present Samantha Belarus, RD, Dutch Gray, RN, BSN;Randi Idelle Crouch BS, ACSM-CEP, Exercise Physiologist;Siana Panameno Earlene Plater, MS, ACSM-CEP, Exercise Physiologist;Casey Katrinka Blazing, RT    Virtual Visit No    Medication changes reported     No    Fall or balance concerns reported    No    Tobacco Cessation No Change    Warm-up and Cool-down Performed as group-led instruction    Resistance Training Performed Yes    VAD Patient? No    PAD/SET Patient? No      Pain Assessment   Currently in Pain? No/denies    Multiple Pain Sites No             Capillary Blood Glucose: No results found for this or any previous visit (from the past 24 hour(s)).   Exercise Prescription Changes - 12/15/22 1200       Response to Exercise   Blood Pressure (Admit) 94/58    Blood Pressure (Exercise) 120/70    Blood Pressure (Exit) 98/60    Heart Rate (Admit) 97 bpm    Heart Rate (Exercise) 108 bpm    Heart Rate (Exit) 93 bpm    Oxygen Saturation (Admit) 97 %    Oxygen Saturation (Exercise) 99 %    Oxygen Saturation (Exit) 94 %    Rating of Perceived Exertion (Exercise) 13    Perceived Dyspnea (Exercise) 1    Duration Continue with 30 min of aerobic exercise without signs/symptoms of physical distress.    Intensity THRR unchanged      Progression   Progression Continue to progress workloads to maintain intensity without signs/symptoms of physical  distress.      Resistance Training   Training Prescription Yes    Weight red bands    Reps 10-15    Time 10 Minutes      Oxygen   Oxygen Continuous    Liters 100 NRB      Recumbant Bike   Level 2    Minutes 15    METs 2.3      NuStep   Level 4    SPM 80    Minutes 15    METs 2.6      Oxygen   Maintain Oxygen Saturation 88% or higher             Social History   Tobacco Use  Smoking Status Former   Packs/day: 2.00   Years: 25.00   Additional pack years: 0.00   Total pack years: 50.00   Types: Cigarettes   Quit date: 62   Years since quitting: 30.3  Smokeless Tobacco Never    Goals Met:  Proper associated with RPD/PD & O2 Sat Exercise tolerated well No report of concerns or symptoms today Strength training completed today  Goals Unmet:  Not Applicable  Comments: Service time is from 1006 to 1149.    Dr. Mechele Collin is Medical Director  for Pulmonary Rehab at [facility].

## 2022-12-15 NOTE — Patient Instructions (Signed)

## 2022-12-16 ENCOUNTER — Ambulatory Visit: Payer: Medicare Other | Admitting: Pulmonary Disease

## 2022-12-17 ENCOUNTER — Encounter (HOSPITAL_COMMUNITY)
Admission: RE | Admit: 2022-12-17 | Discharge: 2022-12-17 | Disposition: A | Discharge status: Home / Self Care | Payer: Medicare Other | Source: Ambulatory Visit | Attending: Pulmonary Disease | Admitting: Pulmonary Disease

## 2022-12-17 DIAGNOSIS — I5032 Chronic diastolic (congestive) heart failure: Secondary | ICD-10-CM | POA: Insufficient documentation

## 2022-12-17 NOTE — Progress Notes (Signed)
Daily Session Note  Patient Details  Name: Frank Meyer MRN: [id number] Date of Birth: [birth year] Referring Provider:   Doristine Devoid Pulmonary Rehab Walk Test from 10/09/2022 in [facility] for [facility], Vascular, & [facility]  Referring Provider Hunsucker       Encounter Date: 12/17/2022  Check In:  Session Check In - 12/17/22 1132       Check-In   Supervising physician immediately available to respond to emergencies [facility] MD immediately available    Physician(s) Edd Fabian, NP    Location [facility]    Staff Present Essie Hart, RN, BSN;Randi Idelle Crouch BS, ACSM-CEP, Exercise Physiologist;Kaylee Earlene Plater, MS, ACSM-CEP, Exercise Physiologist;Akita Maxim Katrinka Blazing, RT    Virtual Visit No    Medication changes reported     No    Fall or balance concerns reported    No    Tobacco Cessation No Change    Warm-up and Cool-down Performed as group-led instruction    Resistance Training Performed Yes    VAD Patient? No    PAD/SET Patient? No      Pain Assessment   Currently in Pain? No/denies    Pain Score 0-No pain    Multiple Pain Sites No             Capillary Blood Glucose: No results found for this or any previous visit (from the past 24 hour(s)).    Social History   Tobacco Use  Smoking Status Former   Packs/day: 2.00   Years: 25.00   Additional pack years: 0.00   Total pack years: 50.00   Types: Cigarettes   Quit date: 71   Years since quitting: 30.3  Smokeless Tobacco Never    Goals Met:  Proper associated with RPD/PD & O2 Sat Independence with exercise equipment Exercise tolerated well No report of concerns or symptoms today Strength training completed today  Goals Unmet:  Not Applicable  Comments: Service time is from 1009 to 1150.    Dr. Mechele Collin is Medical Director for Pulmonary Rehab at [facility].

## 2022-12-22 ENCOUNTER — Encounter (HOSPITAL_COMMUNITY)
Admission: RE | Admit: 2022-12-22 | Discharge: 2022-12-22 | Disposition: A | Discharge status: Home / Self Care | Payer: Medicare Other | Source: Ambulatory Visit | Attending: Pulmonary Disease | Admitting: Pulmonary Disease

## 2022-12-22 DIAGNOSIS — I5032 Chronic diastolic (congestive) heart failure: Secondary | ICD-10-CM

## 2022-12-22 NOTE — Progress Notes (Signed)
Daily Session Note  Patient Details  Name: Frank Meyer MRN: [id number] Date of Birth: 04-Apr-1941 Referring Provider:   Doristine Devoid Pulmonary Rehab Walk Test from 10/09/2022 in [facility], [facility], [facility]  Referring Provider Hunsucker       Encounter Date: 12/22/2022  Check In:  Session Check In - 12/22/22 1220       Check-In   Supervising physician immediately available to respond to emergencies [facility] MD immediately available    Physician(s) Carlos Levering, NP    [facility]    Staff Present Essie Hart, RN, BSN;Randi Idelle Crouch BS, ACSM-CEP, Exercise Physiologist;Ashden Sonnenberg Earlene Plater, MS, ACSM-CEP, Exercise Physiologist;Casey Marcille Buffy, RN, BSN    Virtual Visit No    Medication changes reported     No    Fall or balance concerns reported    No    Tobacco Cessation No Change    Warm-up and Cool-down Performed as group-led instruction    Resistance Training Performed Yes    VAD Patient? No    PAD/SET Patient? No      Pain Assessment   Currently in Pain? No/denies    Pain Score 0-No pain    Multiple Pain Sites No             Capillary Blood Glucose: No results found for this or any previous visit (from the past 24 hour(s)).    Social History   Tobacco Use  Smoking Status Former   Packs/day: 2.00   Years: 25.00   Additional pack years: 0.00   Total pack years: 50.00   Types: Cigarettes   Quit date: 50   Years since quitting: 30.3  Smokeless Tobacco Never    Goals Met:  Proper associated with RPD/PD & O2 Sat Exercise tolerated well No report of concerns or symptoms today Strength training completed today  Goals Unmet:  Not Applicable  Comments: Service time is from 1007 to 1146.    Dr. Mechele Collin is Medical Director for Pulmonary Rehab at [facility].

## 2022-12-24 ENCOUNTER — Encounter (HOSPITAL_COMMUNITY)
Admission: RE | Admit: 2022-12-24 | Discharge: 2022-12-24 | Disposition: A | Discharge status: Home / Self Care | Payer: Medicare Other | Source: Ambulatory Visit | Attending: Pulmonary Disease | Admitting: Pulmonary Disease

## 2022-12-24 DIAGNOSIS — I5032 Chronic diastolic (congestive) heart failure: Secondary | ICD-10-CM

## 2022-12-24 NOTE — Progress Notes (Signed)
Daily Session Note  Patient Details  Name: Frank Meyer MRN: [id number] Date of Birth: 04/22/1941 Referring Provider:   Doristine Devoid Pulmonary Rehab Walk Test from 10/09/2022 in [facility] for Heart, Vascular, & Lung Health  Referring Provider Hunsucker       Encounter Date: 12/24/2022  Check In:  Session Check In - 12/24/22 1128       Check-In   Supervising physician immediately available to respond to emergencies [facility] MD immediately available    Physician(s) Jari Favre, PA    [facility]    Staff Present Essie Hart, RN, BSN;Randi Idelle Crouch BS, ACSM-CEP, Exercise Physiologist;Jaria Conway Earlene Plater, MS, ACSM-CEP, Exercise Physiologist;Casey Heloise Ochoa, MS, ACSM-CEP, CCRP, Exercise Physiologist    Virtual Visit No    Medication changes reported     No    Fall or balance concerns reported    No    Tobacco Cessation No Change    Warm-up and Cool-down Performed as group-led instruction    Resistance Training Performed Yes    VAD Patient? No    PAD/SET Patient? No      Pain Assessment   Currently in Pain? No/denies    Pain Score 0-No pain    Multiple Pain Sites No             Capillary Blood Glucose: No results found for this or any previous visit (from the past 24 hour(s)).    Social History   Tobacco Use  Smoking Status Former   Packs/day: 2.00   Years: 25.00   Additional pack years: 0.00   Total pack years: 50.00   Types: Cigarettes   Quit date: 52   Years since quitting: 30.3  Smokeless Tobacco Never    Goals Met:  Proper associated with RPD/PD & O2 Sat Exercise tolerated well No report of concerns or symptoms today Strength training completed today  Goals Unmet:  Not Applicable  Comments: Service time is from 1022 to 1139.    Dr. Mechele Collin is Medical Director for Pulmonary Rehab at [facility].

## 2022-12-29 ENCOUNTER — Encounter (HOSPITAL_COMMUNITY)
Admission: RE | Admit: 2022-12-29 | Discharge: 2022-12-29 | Disposition: A | Discharge status: Home / Self Care | Payer: Medicare Other | Source: Ambulatory Visit | Attending: Pulmonary Disease | Admitting: Pulmonary Disease

## 2022-12-29 ENCOUNTER — Encounter (HOSPITAL_COMMUNITY): Payer: Self-pay

## 2022-12-29 VITALS — Wt 174.6 lb

## 2022-12-29 DIAGNOSIS — I5032 Chronic diastolic (congestive) heart failure: Secondary | ICD-10-CM

## 2022-12-29 NOTE — Progress Notes (Signed)
Daily Session Note  Patient Details  Name: Frank Meyer MRN: [id number] Date of Birth: 04-30-1941 Referring Provider:   Doristine Devoid Pulmonary Rehab Walk Test from 10/09/2022 in [facility], [facility], [facility]  Referring Provider [place]ucker       Encounter Date: 12/29/2022  Check In:  Session Check In - 12/29/22 1126       Check-In   Supervising physician immediately available to respond to emergencies [facility] MD immediately available    Physician(s) Jari Favre, PA    Location [facility]    Staff Present Samantha Belarus, RD, Dutch Gray, RN, BSN;Randi Reeve BS, ACSM-CEP, Exercise Physiologist;Olinty Peggye Pitt, MS, ACSM-CEP, Exercise Physiologist;Kaylee Earlene Plater, MS, ACSM-CEP, Exercise Physiologist;Casey Katrinka Blazing, RT    Virtual Visit No    Medication changes reported     No    Fall or balance concerns reported    No    Tobacco Cessation No Change    Warm-up and Cool-down Performed as group-led instruction    Resistance Training Performed Yes    VAD Patient? No    PAD/SET Patient? No      Pain Assessment   Currently in Pain? No/denies    Multiple Pain Sites No             Capillary Blood Glucose: No results found for this or any previous visit (from the past 24 hour(s)).   Exercise Prescription Changes - 12/29/22 1200       Response to Exercise   Blood Pressure (Admit) 92/60    Blood Pressure (Exercise) 108/40    Blood Pressure (Exit) 104/56    Heart Rate (Admit) 80 bpm    Heart Rate (Exercise) 100 bpm    Heart Rate (Exit) 92 bpm    Oxygen Saturation (Admit) 95 %   6L   Oxygen Saturation (Exercise) 81 %   on 15L, pt refused to wear NRB mask, instructed pt to slow down his intensity   Oxygen Saturation (Exit) 92 %   on 6L   Rating of Perceived Exertion (Exercise) 12    Perceived Dyspnea (Exercise) 1    Duration Continue with 30 min of aerobic exercise without signs/symptoms of physical distress.     Intensity THRR unchanged      Progression   Progression Continue to progress workloads to maintain intensity without signs/symptoms of physical distress.      Resistance Training   Training Prescription Yes    Weight blue bands    Reps 10-15    Time 10 Minutes      Oxygen   Oxygen Continuous    Liters 15   Pt refuses to wear NRB mask     Recumbant Bike   Level 2    Minutes 15    METs 2      NuStep   Level 4    SPM 80    Minutes 15    METs 2.2      Oxygen   Maintain Oxygen Saturation 88% or higher             Social History   Tobacco Use  Smoking Status Former   Packs/day: 2.00   Years: 25.00   Additional pack years: 0.00   Total pack years: 50.00   Types: Cigarettes   Quit date: 71   Years since quitting: 30.3  Smokeless Tobacco Never    Goals Met:  Independence with exercise equipment Exercise tolerated well No report of concerns or symptoms  today Strength training completed today  Goals Unmet:  O2 Sat  Comments: Service time is from 1006 to 1145   Dr. Mechele Collin is Medical Director for Pulmonary Rehab at [facility].

## 2022-12-30 NOTE — Progress Notes (Signed)
Pulmonary Individual Treatment Plan  Patient Details  Name: Frank Meyer MRN: [id number] Date of Birth: [birth year] Referring Provider:   Doristine Devoid Pulmonary Rehab Walk Test from 10/09/2022 in [facility]  Referring Provider Hunsucker       Initial Encounter Date:  Flowsheet Row Pulmonary Rehab Walk Test from 10/09/2022 in [facility] for Heart, Vascular, [facility] Lung [facility]  Date 10/09/22       Visit Diagnosis: Chronic diastolic HF (heart failure) (HCC)  Patient's Home Medications on Admission:   Current Outpatient Medications:    ANORO ELLIPTA 62.5-25 MCG/ACT AEPB, USE 1 INHALATION BY MOUTH DAILY (Patient taking differently: Inhale 1 puff into the lungs daily.), Disp: 180 each, Rfl: 3   aspirin 81 MG EC tablet, Take 81 mg by mouth daily., Disp: , Rfl:    atorvastatin (LIPITOR) 20 MG tablet, Take 20 mg by mouth every evening., Disp: , Rfl:    cholecalciferol (VITAMIN D) 1000 UNITS tablet, Take 1,000 Units by mouth daily., Disp: , Rfl:    donepezil (ARICEPT) 10 MG tablet, Take 1 tablet (10 mg total) by mouth daily., Disp: 90 tablet, Rfl: 3   ENTRESTO 24-26 MG, TAKE 1 TABLET BY MOUTH DAILY, Disp: 100 tablet, Rfl: 2   fexofenadine (ALLEGRA) 180 MG tablet, Take 180 mg by mouth daily., Disp: , Rfl:    finasteride (PROSCAR) 5 MG tablet, Take 5 mg by mouth at bedtime., Disp: , Rfl:    furosemide (LASIX) 40 MG tablet, Take 1 tablet (40 mg total) by mouth every other day., Disp: 30 tablet, Rfl: 0   ipratropium (ATROVENT) 0.06 % nasal spray, Place 1-2 sprays into both nostrils See admin instructions. 2 sprays in the right nostril and 1 spray in the left nostril once daily, Disp: , Rfl:    ipratropium-albuterol (DUONEB) 0.5-2.5 (3) MG/3ML SOLN, Take 3 mLs by nebulization 2 (two) times daily., Disp: 540 mL, Rfl: 3   levETIRAcetam (KEPPRA) 250 MG tablet, Take 1-2 tablets (250-500 mg total) by mouth See admin  instructions. Take 250 mg by mouth in the morning and 500 mg by mouth at night. (Patient taking differently: Take 250-500 mg by mouth See admin instructions. Take one tablet (250 mg) by mouth in the morning and two tablets (500 mg) by mouth at night.), Disp: 270 tablet, Rfl: 3   Melatonin 10 MG TABS, Take 20 mg by mouth at bedtime., Disp: , Rfl:    nitroGLYCERIN (NITROSTAT) 0.4 MG SL tablet, Place 1 tablet (0.4 mg total) under the tongue every 5 (five) minutes as needed. Chest pain (Patient taking differently: Place 0.4 mg under the tongue every 5 (five) minutes as needed for chest pain. Chest pain), Disp: 25 tablet, Rfl: 6   omeprazole (PRILOSEC) 40 MG capsule, Take 40 mg by mouth daily. , Disp: , Rfl:    OXYGEN, Inhale 2-5 L into the lungs as directed. 5lt during the day and 2lt at night, Disp: , Rfl:    thiamine 100 MG tablet, Take 1 tablet (100 mg total) by mouth daily., Disp: 30 tablet, Rfl: 0   traZODone (DESYREL) 100 MG tablet, TAKE 2 TABLETS BY MOUTH AT  BEDTIME, Disp: 180 tablet, Rfl: 3  Past Medical History: Past Medical History:  Diagnosis Date   AKI (acute kidney injury) (HCC) 08/03/2022   Allergic rhinitis 10/24/2009   Altered bowel habits 10/04/2020   Basal cell carcinoma    Benign prostatic hyperplasia with lower urinary tract  symptoms 10/24/2009   Benign prostatic hypertrophy    Bradycardia 12/06/2019   Callosity 10/16/2013   Cardiomegaly 01/23/2015   Chronic diastolic heart failure (HCC) 12/19/2018   Chronic respiratory failure (HCC) 10/04/2020   Coronary artery calcification seen on CAT scan 05/28/2020   Dyslipidemia    Dyspnea on exertion 03/18/2018   Elevated troponin 08/03/2022   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   Encounter for general adult medical examination without abnormal findings 01/17/2015   Epistaxis 09/18/2016   Ex-cigarette smoker 01/20/2021   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    Gallbladder problem    Gastroesophageal reflux disease    Hiatal hernia    Hilar adenopathy 03/25/2018   History of seizures 08/03/2022   History of smoking 03/18/2018   History of transient ischemic attack (TIA) 10/24/2009   Hyperlipidemia 10/24/2009   Insomnia 05/09/2013   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   Internal hemorrhoid 10/04/2020   Macular degeneration    Macular pucker, right eye 04/30/2020   Mediastinal adenopathy 03/25/2018   Memory loss 05/09/2013   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   Mild memory disturbance    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   Noninfective gastroenteritis and colitis, unspecified 10/04/2020   Nuclear sclerotic cataract of right eye 12/18/2019   Other specified symptoms and signs involving the digestive system and abdomen 10/04/2020   Overweight 09/14/2018   Oxygen dependent 10/04/2020   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   Personal history of colonic polyps 10/04/2020   Posterior vitreous detachment of right eye 12/18/2019   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   Pulmonary hypertension (HCC)    Right upper lobe pulmonary nodule 03/25/2018   Syncope and collapse 12/12/2018   TIA (transient ischemic attack)    Transient ischemic attack 10/24/2009   Vasomotor rhinitis 09/18/2016   Weight decreased 10/04/2020    Tobacco Use: Social History   Tobacco Use  Smoking Status Former   Packs/day: 2.00   Years: 25.00   Additional pack years: 0.00   Total pack years: 50.00   Types: Cigarettes   Quit date: 1994   Years since quitting: 30.3  Smokeless Tobacco Never    Labs: Review Flowsheet  More data may exist      Latest Ref Rng & Units 06/13/2020 07/20/2022 08/02/2022 08/03/2022 10/27/2022  Labs for ITP Cardiac and Pulmonary Rehab  Cholestrol 100 - 199 mg/dL 829  - - - -  LDL (calc) 0 - 99 mg/dL 67  - - - -  HDL-C >56 mg/dL 57  - - - -   Trlycerides 0 - 149 mg/dL 213  - - - -  PH, Arterial 7.35 - 7.45 - 7.402  - - -  PCO2 arterial 32 - 48 mmHg - 37.0  - - -  Bicarbonate 20.0 - 28.0 mmol/L - 23.1  21.2  29.2  30.8   TCO2 22 - 32 mmol/L - 24  23  - 33   Acid-base deficit 0.0 - 2.0 mmol/L - 1.0  6.0  - -  O2 Saturation % - 95  71  32.6  45     Capillary Blood Glucose: Lab Results  Component Value Date   GLUCAP 134 (H) 07/22/2022   GLUCAP 193 (H) 07/21/2022   GLUCAP 100 (H) 12/12/2018   GLUCAP 101 (H) 04/08/2018     Pulmonary Assessment Scores:  Pulmonary Assessment Scores     Row Name 10/09/22 1336  ADL UCSD   ADL Phase Entry     SOB Score total 49       CAT Score   CAT Score 17       mMRC Score   mMRC Score 4             UCSD: Self-administered rating of dyspnea associated with activities of daily living (ADLs) 6-point scale (0 = "not at all" to 5 = "maximal or unable to do because of breathlessness")  Scoring Scores range from 0 to 120.  Minimally important difference is 5 units  CAT: CAT can identify the health impairment of COPD patients and is better correlated with disease progression.  CAT has a scoring range of zero to 40. The CAT score is classified into four groups of low (less than 10), medium (10 - 20), high (21-30) and very high (31-40) based on the impact level of disease on health status. A CAT score over 10 suggests significant symptoms.  A worsening CAT score could be explained by an exacerbation, poor medication adherence, poor inhaler technique, or progression of COPD or comorbid conditions.  CAT MCID is 2 points  mMRC: mMRC (Modified Medical Research Council) Dyspnea Scale is used to assess the degree of baseline functional disability in patients of respiratory disease due to dyspnea. No minimal important difference is established. A decrease in score of 1 point or greater is considered a positive change.   Pulmonary Function Assessment:  Pulmonary Function Assessment  - 10/09/22 1513       Breath   Bilateral Breath Sounds Decreased    Shortness of Breath No;Limiting activity             Exercise Target Goals: Exercise Program Goal: Individual exercise prescription set using results from initial 6 min walk test and THRR while considering  patient's activity barriers and safety.   Exercise Prescription Goal: Initial exercise prescription builds to 30-45 minutes a day of aerobic activity, 2-3 days per week.  Home exercise guidelines will be given to patient during program as part of exercise prescription that the participant will acknowledge.  Activity Barriers & Risk Stratification:  Activity Barriers & Cardiac Risk Stratification - 10/09/22 1340       Activity Barriers & Cardiac Risk Stratification   Activity Barriers Shortness of Breath;Muscular Weakness;Deconditioning;Other (comment);Balance Concerns;History of Falls    Comments O2    Cardiac Risk Stratification High             6 Minute Walk:  6 Minute Walk     Row Name 10/09/22 1504         6 Minute Walk   Phase Initial     Distance 730 feet     Walk Time 6 minutes     # of Rest Breaks 1  2:40-4:10 due to hypoxia at 82 on 8L/40 FiO2     MPH 1.38     METS 1.06     RPE 9     Perceived Dyspnea  0     VO2 Peak 3.7     Symptoms No  pt sts not SOB     Resting HR 91 bpm     Resting BP 100/64     Resting Oxygen Saturation  90 %     Exercise Oxygen Saturation  during 6 min walk 82 %     Max Ex. HR 105 bpm     Max Ex. BP 94/54     2 Minute Post BP 96/68  Interval HR   1 Minute HR 95     2 Minute HR 104     3 Minute HR 105     4 Minute HR 97     5 Minute HR 96     6 Minute HR 105     2 Minute Post HR 91     Interval Heart Rate? Yes       Interval Oxygen   Interval Oxygen? Yes     Baseline Oxygen Saturation % 90 %     1 Minute Oxygen Saturation % 95 %     1 Minute Liters of Oxygen 8 L     2 Minute Oxygen Saturation % 91 %     2 Minute Liters of Oxygen 8 L      3 Minute Oxygen Saturation % 85 %     3 Minute Liters of Oxygen 10 L     4 Minute Oxygen Saturation % 90 %     4 Minute Liters of Oxygen 10 L     5 Minute Oxygen Saturation % 98 %     5 Minute Liters of Oxygen 10 L     6 Minute Oxygen Saturation % 94 %     6 Minute Liters of Oxygen 10 L     2 Minute Post Oxygen Saturation % 94 %     2 Minute Post Liters of Oxygen 10 L              Oxygen Initial Assessment:  Oxygen Initial Assessment - 10/09/22 1332       Home Oxygen   Home Oxygen Device Portable Concentrator;Home Concentrator;E-Tanks    Sleep Oxygen Prescription Continuous    Liters per minute 2    Home Exercise Oxygen Prescription Continuous    Liters per minute 6    Home Resting Oxygen Prescription Continuous    Liters per minute 5   only due to not being able to change concentrator setting without walking over and back when resting. Prescription is 2L per pt   Compliance with Home Oxygen Use Yes      Initial 6 min Walk   Oxygen Used Continuous    Liters per minute 10    FiO2% 45   venturi     Program Oxygen Prescription   Program Oxygen Prescription Continuous    Liters per minute 10    FiO2% 45    Comments venturi      Intervention   Short Term Goals To learn and exhibit compliance with exercise, home and travel O2 prescription;To learn and understand importance of maintaining oxygen saturations>88%;To learn and demonstrate proper use of respiratory medications;To learn and understand importance of monitoring SPO2 with pulse oximeter and demonstrate accurate use of the pulse oximeter.;To learn and demonstrate proper pursed lip breathing techniques or other breathing techniques.     Long  Term Goals Demonstrates proper use of MDI's;Compliance with respiratory medication;Exhibits proper breathing techniques, such as pursed lip breathing or other method taught during program session;Maintenance of O2 saturations>88%;Verbalizes importance of monitoring SPO2 with pulse  oximeter and return demonstration;Exhibits compliance with exercise, home  and travel O2 prescription             Oxygen Re-Evaluation:  Oxygen Re-Evaluation     Row Name 10/23/22 0804 11/16/22 1109 12/23/22 1057         Program Oxygen Prescription   Program Oxygen Prescription Continuous Continuous Continuous     Liters per minute 15 -5 15  FiO2% 45 45 --     Comments NRB mask uses high flow nasal cannula uses high flow nasal cannula and NRB mask       Home Oxygen   Home Oxygen Device Portable Concentrator;Home Concentrator;E-Tanks Portable Concentrator;Home Concentrator;E-Tanks Portable Concentrator;Home Concentrator;E-Tanks     Sleep Oxygen Prescription Continuous Continuous Continuous     Liters per minute 2 2 2      Home Exercise Oxygen Prescription Continuous Continuous Continuous     Liters per minute 6 6 6      Home Resting Oxygen Prescription Continuous Continuous Continuous     Liters per minute 5  only due to not being able to change concentrator setting without walking over and back when resting. Prescription is 2L per pt 5  only due to not being able to change concentrator setting without walking over and back when resting. Prescription is 2L per pt 5  only due to not being able to change concentrator setting without walking over and back when resting. Prescription is 2L per pt     Compliance with Home Oxygen Use Yes Yes Yes       Goals/Expected Outcomes   Short Term Goals To learn and exhibit compliance with exercise, home and travel O2 prescription;To learn and understand importance of maintaining oxygen saturations>88%;To learn and demonstrate proper use of respiratory medications;To learn and understand importance of monitoring SPO2 with pulse oximeter and demonstrate accurate use of the pulse oximeter.;To learn and demonstrate proper pursed lip breathing techniques or other breathing techniques.  To learn and exhibit compliance with exercise, home and travel O2  prescription;To learn and understand importance of maintaining oxygen saturations>88%;To learn and demonstrate proper use of respiratory medications;To learn and understand importance of monitoring SPO2 with pulse oximeter and demonstrate accurate use of the pulse oximeter.;To learn and demonstrate proper pursed lip breathing techniques or other breathing techniques.  To learn and exhibit compliance with exercise, home and travel O2 prescription;To learn and understand importance of maintaining oxygen saturations>88%;To learn and demonstrate proper use of respiratory medications;To learn and understand importance of monitoring SPO2 with pulse oximeter and demonstrate accurate use of the pulse oximeter.;To learn and demonstrate proper pursed lip breathing techniques or other breathing techniques.      Long  Term Goals Demonstrates proper use of MDI's;Compliance with respiratory medication;Exhibits proper breathing techniques, such as pursed lip breathing or other method taught during program session;Maintenance of O2 saturations>88%;Verbalizes importance of monitoring SPO2 with pulse oximeter and return demonstration;Exhibits compliance with exercise, home  and travel O2 prescription Demonstrates proper use of MDI's;Compliance with respiratory medication;Exhibits proper breathing techniques, such as pursed lip breathing or other method taught during program session;Maintenance of O2 saturations>88%;Verbalizes importance of monitoring SPO2 with pulse oximeter and return demonstration;Exhibits compliance with exercise, home  and travel O2 prescription Demonstrates proper use of MDI's;Compliance with respiratory medication;Exhibits proper breathing techniques, such as pursed lip breathing or other method taught during program session;Maintenance of O2 saturations>88%;Verbalizes importance of monitoring SPO2 with pulse oximeter and return demonstration;Exhibits compliance with exercise, home  and travel O2 prescription      Goals/Expected Outcomes Compliance and understanding of oxygen saturation monitoring and breathing techniques to decrease shortness of breath. Compliance and understanding of oxygen saturation monitoring and breathing techniques to decrease shortness of breath. Compliance and understanding of oxygen saturation monitoring and breathing techniques to decrease shortness of breath.              Oxygen Discharge (Final Oxygen Re-Evaluation):  Oxygen Re-Evaluation - 12/23/22 1057  Program Oxygen Prescription   Program Oxygen Prescription Continuous    Liters per minute 15    Comments uses high flow nasal cannula and NRB mask      Home Oxygen   Home Oxygen Device Portable Concentrator;Home Concentrator;E-Tanks    Sleep Oxygen Prescription Continuous    Liters per minute 2    Home Exercise Oxygen Prescription Continuous    Liters per minute 6    Home Resting Oxygen Prescription Continuous    Liters per minute 5   only due to not being able to change concentrator setting without walking over and back when resting. Prescription is 2L per pt   Compliance with Home Oxygen Use Yes      Goals/Expected Outcomes   Short Term Goals To learn and exhibit compliance with exercise, home and travel O2 prescription;To learn and understand importance of maintaining oxygen saturations>88%;To learn and demonstrate proper use of respiratory medications;To learn and understand importance of monitoring SPO2 with pulse oximeter and demonstrate accurate use of the pulse oximeter.;To learn and demonstrate proper pursed lip breathing techniques or other breathing techniques.     Long  Term Goals Demonstrates proper use of MDI's;Compliance with respiratory medication;Exhibits proper breathing techniques, such as pursed lip breathing or other method taught during program session;Maintenance of O2 saturations>88%;Verbalizes importance of monitoring SPO2 with pulse oximeter and return demonstration;Exhibits  compliance with exercise, home  and travel O2 prescription    Goals/Expected Outcomes Compliance and understanding of oxygen saturation monitoring and breathing techniques to decrease shortness of breath.             Initial Exercise Prescription:  Initial Exercise Prescription - 10/09/22 1500       Date of Initial Exercise RX and Referring Provider   Date 10/09/22    Referring Provider Hunsucker    Expected Discharge Date 01/07/23      Oxygen   Oxygen Continuous    Liters 10L/45 FiO2    Maintain Oxygen Saturation 88% or higher      Recumbant Bike   Level 1    RPM 70    Minutes 15    METs 1.5      NuStep   Level 1    SPM 70    Minutes 15    METs 1.5      Prescription Details   Frequency (times per week) 2    Duration Progress to 30 minutes of continuous aerobic without signs/symptoms of physical distress      Intensity   THRR 40-80% of Max Heartrate 56-111    Ratings of Perceived Exertion 11-13    Perceived Dyspnea 0-4      Progression   Progression Continue to progress workloads to maintain intensity without signs/symptoms of physical distress.      Resistance Training   Training Prescription Yes    Weight red bands    Reps 10-15             Perform Capillary Blood Glucose checks as needed.  Exercise Prescription Changes:   Exercise Prescription Changes     Row Name 10/22/22 1200 11/03/22 1200 11/17/22 1200 12/10/22 1500 12/15/22 1200     Response to Exercise   Blood Pressure (Admit) 110/70 98/60 98/64  -- 94/58   Blood Pressure (Exercise) 118/68 118/58 112/59 -- 120/70   Blood Pressure (Exit) 98/60 92/56 110/60 -- 98/60   Heart Rate (Admit) 94 bpm 110 bpm 112 bpm -- 97 bpm   Heart Rate (Exercise) 92 bpm 108 bpm 104 bpm --  108 bpm   Heart Rate (Exit) 87 bpm 85 bpm 102 bpm -- 93 bpm   Oxygen Saturation (Admit) 93 % 91 % 95 % -- 97 %   Oxygen Saturation (Exercise) 90 %  15L @ 55% 89 % 90 %  pt had to be increased to NRB mask. Sats 90% -- 99 %    Oxygen Saturation (Exit) 93 % 99 % 97 %  O2 weaned to 6L -- 94 %   Rating of Perceived Exertion (Exercise) 11 13 13  -- 13   Perceived Dyspnea (Exercise) 1 1 1  -- 1   Duration Progress to 30 minutes of  aerobic without signs/symptoms of physical distress Progress to 30 minutes of  aerobic without signs/symptoms of physical distress Progress to 30 minutes of  aerobic without signs/symptoms of physical distress -- Continue with 30 min of aerobic exercise without signs/symptoms of physical distress.   Intensity THRR unchanged THRR unchanged THRR unchanged -- THRR unchanged     Progression   Progression Continue to progress workloads to maintain intensity without signs/symptoms of physical distress. Continue to progress workloads to maintain intensity without signs/symptoms of physical distress. Continue to progress workloads to maintain intensity without signs/symptoms of physical distress. -- Continue to progress workloads to maintain intensity without signs/symptoms of physical distress.     Resistance Training   Training Prescription Yes Yes Yes -- Yes   Weight red bands red bands red bands -- red bands   Reps 10-15 10-15 10-15 -- 10-15   Time 10 Minutes 10 Minutes 10 Minutes -- 10 Minutes     Oxygen   Oxygen Continuous Continuous Continuous -- Continuous   Liters 15L/55 FiO2 8 100% NRB mask -- 100 NRB     Recumbant Bike   Level 1 1 2  -- 2   Minutes 15 15 15  -- 15   METs 1.8 1.8 2 -- 2.3     NuStep   Level 3 3 3  -- 4   SPM 80 80 80 -- 80   Minutes 15 15 15  -- 15   METs 2.4 2.2 2 -- 2.6     Home Exercise Plan   Plans to continue exercise at -- -- -- [employer] (comment)  [employer] or [employer] --   Frequency -- -- -- Add 1 additional day to program exercise sessions. --   Initial Home Exercises Provided -- -- -- 12/10/22 --     Oxygen   Maintain Oxygen Saturation 88% or higher 88% or higher 88% or higher -- 88% or higher    Row Name 12/29/22 1200             Response to  Exercise   Blood Pressure (Admit) 92/60       Blood Pressure (Exercise) 108/40       Blood Pressure (Exit) 104/56       Heart Rate (Admit) 80 bpm       Heart Rate (Exercise) 100 bpm       Heart Rate (Exit) 92 bpm       Oxygen Saturation (Admit) 95 %  6L       Oxygen Saturation (Exercise) 81 %  on 15L, pt refused to wear NRB mask, instructed pt to slow down his intensity       Oxygen Saturation (Exit) 92 %  on 6L       Rating of Perceived Exertion (Exercise) 12       Perceived Dyspnea (Exercise) 1       Duration  Continue with 30 min of aerobic exercise without signs/symptoms of physical distress.       Intensity THRR unchanged         Progression   Progression Continue to progress workloads to maintain intensity without signs/symptoms of physical distress.         Resistance Training   Training Prescription Yes       Weight blue bands       Reps 10-15       Time 10 Minutes         Oxygen   Oxygen Continuous       Liters 15  Pt refuses to wear NRB mask         Recumbant Bike   Level 2       Minutes 15       METs 2         NuStep   Level 4       SPM 80       Minutes 15       METs 2.2         Oxygen   Maintain Oxygen Saturation 88% or higher                Exercise Comments:   Exercise Comments     Row Name 12/10/22 1521           Exercise Comments Completed home exercise plan. Bleu is not currently exercising at home. I encouraged him to try exercising at a fitness center at least 1 non-rehab day/wk for 30 min/day. Armas stated that he has 2 fitness centers that he could go to which include [employer] and [employer]. I recommended going to the [facility] because they are more likely to have similar equipment to rehab. Ziair will check out both fitness centers and report back to me. Rashaud seems motivated to exercise.                Exercise Goals and Review:   Exercise Goals     Row Name 10/09/22 1335 10/23/22 0801 11/16/22 1053  12/23/22 1055       Exercise Goals   Increase Physical Activity Yes Yes Yes Yes    Intervention Provide advice, education, support and counseling about physical activity/exercise needs.;Develop an individualized exercise prescription for aerobic and resistive training based on initial evaluation findings, risk stratification, comorbidities and participant's personal goals. Provide advice, education, support and counseling about physical activity/exercise needs.;Develop an individualized exercise prescription for aerobic and resistive training based on initial evaluation findings, risk stratification, comorbidities and participant's personal goals. Provide advice, education, support and counseling about physical activity/exercise needs.;Develop an individualized exercise prescription for aerobic and resistive training based on initial evaluation findings, risk stratification, comorbidities and participant's personal goals. Provide advice, education, support and counseling about physical activity/exercise needs.;Develop an individualized exercise prescription for aerobic and resistive training based on initial evaluation findings, risk stratification, comorbidities and participant's personal goals.    Expected Outcomes Short Term: Attend rehab on a regular basis to increase amount of physical activity.;Long Term: Add in home exercise to make exercise part of routine and to increase amount of physical activity.;Long Term: Exercising regularly at least 3-5 days a week. Short Term: Attend rehab on a regular basis to increase amount of physical activity.;Long Term: Add in home exercise to make exercise part of routine and to increase amount of physical activity.;Long Term: Exercising regularly at least 3-5 days a week. Short Term: Attend rehab on a regular  basis to increase amount of physical activity.;Long Term: Add in home exercise to make exercise part of routine and to increase amount of physical activity.;Long  Term: Exercising regularly at least 3-5 days a week. Short Term: Attend rehab on a regular basis to increase amount of physical activity.;Long Term: Add in home exercise to make exercise part of routine and to increase amount of physical activity.;Long Term: Exercising regularly at least 3-5 days a week.    Increase Strength and Stamina Yes Yes Yes Yes    Intervention Provide advice, education, support and counseling about physical activity/exercise needs.;Develop an individualized exercise prescription for aerobic and resistive training based on initial evaluation findings, risk stratification, comorbidities and participant's personal goals. Provide advice, education, support and counseling about physical activity/exercise needs.;Develop an individualized exercise prescription for aerobic and resistive training based on initial evaluation findings, risk stratification, comorbidities and participant's personal goals. Provide advice, education, support and counseling about physical activity/exercise needs.;Develop an individualized exercise prescription for aerobic and resistive training based on initial evaluation findings, risk stratification, comorbidities and participant's personal goals. Provide advice, education, support and counseling about physical activity/exercise needs.;Develop an individualized exercise prescription for aerobic and resistive training based on initial evaluation findings, risk stratification, comorbidities and participant's personal goals.    Expected Outcomes Short Term: Increase workloads from initial exercise prescription for resistance, speed, and METs.;Short Term: Perform resistance training exercises routinely during rehab and add in resistance training at home;Long Term: Improve cardiorespiratory fitness, muscular endurance and strength as measured by increased METs and functional capacity ( ) Short Term: Increase workloads from initial exercise prescription for resistance,  speed, and METs.;Short Term: Perform resistance training exercises routinely during rehab and add in resistance training at home;Long Term: Improve cardiorespiratory fitness, muscular endurance and strength as measured by increased METs and functional capacity ( ) Short Term: Increase workloads from initial exercise prescription for resistance, speed, and METs.;Short Term: Perform resistance training exercises routinely during rehab and add in resistance training at home;Long Term: Improve cardiorespiratory fitness, muscular endurance and strength as measured by increased METs and functional capacity ( ) Short Term: Increase workloads from initial exercise prescription for resistance, speed, and METs.;Short Term: Perform resistance training exercises routinely during rehab and add in resistance training at home;Long Term: Improve cardiorespiratory fitness, muscular endurance and strength as measured by increased METs and functional capacity ( )    Able to understand and use rate of perceived exertion (RPE) scale Yes Yes Yes Yes    Intervention Provide education and explanation on how to use RPE scale Provide education and explanation on how to use RPE scale Provide education and explanation on how to use RPE scale Provide education and explanation on how to use RPE scale    Expected Outcomes Short Term: Able to use RPE daily in rehab to express subjective intensity level;Long Term:  Able to use RPE to guide intensity level when exercising independently Short Term: Able to use RPE daily in rehab to express subjective intensity level;Long Term:  Able to use RPE to guide intensity level when exercising independently Short Term: Able to use RPE daily in rehab to express subjective intensity level;Long Term:  Able to use RPE to guide intensity level when exercising independently Short Term: Able to use RPE daily in rehab to express subjective intensity level;Long Term:  Able to use RPE to guide intensity  level when exercising independently    Able to understand and use Dyspnea scale Yes Yes Yes Yes    Intervention Provide education and explanation on  how to use Dyspnea scale Provide education and explanation on how to use Dyspnea scale Provide education and explanation on how to use Dyspnea scale Provide education and explanation on how to use Dyspnea scale    Expected Outcomes Short Term: Able to use Dyspnea scale daily in rehab to express subjective sense of shortness of breath during exertion;Long Term: Able to use Dyspnea scale to guide intensity level when exercising independently Short Term: Able to use Dyspnea scale daily in rehab to express subjective sense of shortness of breath during exertion;Long Term: Able to use Dyspnea scale to guide intensity level when exercising independently Short Term: Able to use Dyspnea scale daily in rehab to express subjective sense of shortness of breath during exertion;Long Term: Able to use Dyspnea scale to guide intensity level when exercising independently Short Term: Able to use Dyspnea scale daily in rehab to express subjective sense of shortness of breath during exertion;Long Term: Able to use Dyspnea scale to guide intensity level when exercising independently    Knowledge and understanding of Target Heart Rate Range (THRR) Yes Yes Yes Yes    Intervention Provide education and explanation of THRR including how the numbers were predicted and where they are located for reference Provide education and explanation of THRR including how the numbers were predicted and where they are located for reference Provide education and explanation of THRR including how the numbers were predicted and where they are located for reference Provide education and explanation of THRR including how the numbers were predicted and where they are located for reference    Expected Outcomes Short Term: Able to state/look up THRR;Long Term: Able to use THRR to govern intensity when  exercising independently;Short Term: Able to use daily as guideline for intensity in rehab Short Term: Able to state/look up THRR;Long Term: Able to use THRR to govern intensity when exercising independently;Short Term: Able to use daily as guideline for intensity in rehab Short Term: Able to state/look up THRR;Long Term: Able to use THRR to govern intensity when exercising independently;Short Term: Able to use daily as guideline for intensity in rehab Short Term: Able to state/look up THRR;Long Term: Able to use THRR to govern intensity when exercising independently;Short Term: Able to use daily as guideline for intensity in rehab    Understanding of Exercise Prescription Yes Yes Yes Yes    Intervention Provide education, explanation, and written materials on patient's individual exercise prescription Provide education, explanation, and written materials on patient's individual exercise prescription Provide education, explanation, and written materials on patient's individual exercise prescription Provide education, explanation, and written materials on patient's individual exercise prescription    Expected Outcomes Short Term: Able to explain program exercise prescription;Long Term: Able to explain home exercise prescription to exercise independently Short Term: Able to explain program exercise prescription;Long Term: Able to explain home exercise prescription to exercise independently Short Term: Able to explain program exercise prescription;Long Term: Able to explain home exercise prescription to exercise independently Short Term: Able to explain program exercise prescription;Long Term: Able to explain home exercise prescription to exercise independently             Exercise Goals Re-Evaluation :  Exercise Goals Re-Evaluation     Row Name 10/23/22 0801 11/16/22 1053 12/23/22 1055         Exercise Goal Re-Evaluation   Exercise Goals Review Increase Physical Activity;Increase Strength and  Stamina;Knowledge and understanding of Target Heart Rate Range (THRR);Able to understand and use rate of perceived exertion (RPE) scale;Able to  understand and use Dyspnea scale;Understanding of Exercise Prescription Increase Physical Activity;Increase Strength and Stamina;Knowledge and understanding of Target Heart Rate Range (THRR);Able to understand and use rate of perceived exertion (RPE) scale;Able to understand and use Dyspnea scale;Understanding of Exercise Prescription Increase Physical Activity;Increase Strength and Stamina;Knowledge and understanding of Target Heart Rate Range (THRR);Able to understand and use rate of perceived exertion (RPE) scale;Able to understand and use Dyspnea scale;Understanding of Exercise Prescription     Comments Prithvi has completed 3 exercise sessions. He exercises for 15 min on the recumbent bike and Nustep. Nestor performs the warmup and cooldown standing without limitations. He is very deconditioned. It is too soon to notate any discernable progressions. Will continue to monitor and progress as able. Vineeth has completed 7 exercise sessions. He exercises for 15 min on the recumbent bike and Nustep. He averages 2.3 METs at level 2 on the recumbent bike and 2.0 METs at level 3 on the Nustep. Teron performs the warmup and cooldown standing without limitations. He has increased his workloads for both exercise modes as he tolerated progressions well. Kainon seems motivated to exercise. Will continue to monitor and progress as able. Lucia has completed 13 exercise sessions. He exercises for 15 min on the recumbent bike and Nustep. He averages 2.2 METs at level 2 on the recumbent bike and 2.2 METs at level 4 on the Nustep. Levander performs the warmup and cooldown standing without limitations. He has increased his workload for the Nustep but not the recumbent bike. Kao has been difficult progress. He is most likely reaching his limit. I have discussed exercising at home  as he has resources for post rehab. Will continue to monitor and progress as able.     Expected Outcomes Through exercise at rehab and home, the patient will decrease shortness of breath with daily activities and feel confident in carrying out an exercise regimen at home. Through exercise at rehab and home, the patient will decrease shortness of breath with daily activities and feel confident in carrying out an exercise regimen at home. Through exercise at rehab and home, the patient will decrease shortness of breath with daily activities and feel confident in carrying out an exercise regimen at home.              Discharge Exercise Prescription (Final Exercise Prescription Changes):  Exercise Prescription Changes - 12/29/22 1200       Response to Exercise   Blood Pressure (Admit) 92/60    Blood Pressure (Exercise) 108/40    Blood Pressure (Exit) 104/56    Heart Rate (Admit) 80 bpm    Heart Rate (Exercise) 100 bpm    Heart Rate (Exit) 92 bpm    Oxygen Saturation (Admit) 95 %   6L   Oxygen Saturation (Exercise) 81 %   on 15L, pt refused to wear NRB mask, instructed pt to slow down his intensity   Oxygen Saturation (Exit) 92 %   on 6L   Rating of Perceived Exertion (Exercise) 12    Perceived Dyspnea (Exercise) 1    Duration Continue with 30 min of aerobic exercise without signs/symptoms of physical distress.    Intensity THRR unchanged      Progression   Progression Continue to progress workloads to maintain intensity without signs/symptoms of physical distress.      Resistance Training   Training Prescription Yes    Weight blue bands    Reps 10-15    Time 10 Minutes      Oxygen  Oxygen Continuous    Liters 15   Pt refuses to wear NRB mask     Recumbant Bike   Level 2    Minutes 15    METs 2      NuStep   Level 4    SPM 80    Minutes 15    METs 2.2      Oxygen   Maintain Oxygen Saturation 88% or higher             Nutrition:  Target Goals: Understanding of  nutrition guidelines, daily intake of sodium 1500mg , cholesterol 200mg , calories 30% from fat and 7% or less from saturated fats, daily to have 5 or more servings of fruits and vegetables.  Biometrics:    Nutrition Therapy Plan and Nutrition Goals:  Nutrition Therapy & Goals - 12/08/22 1130       Nutrition Therapy   Diet Heart healthy diet    Drug/Food Interactions Statins/Certain Fruits      Personal Nutrition Goals   Nutrition Goal Patient to reduce sodium intake 2300mg  per day    Personal Goal #2 Patient to improve diet quality by using the plate method as a daily guide for meal planning to include lean protein/plant protein, fruits, vegetables, whole grains, nonfat dairy as part of well balanced diet    Comments Goals in action.Sergi has not attended pulmonary rehab since 4/2, but he returns today after getting cleared by pulomonary following illness. Jakwon continues following a low sodium diet and reading food labels for sodium. He does report 1 V8 drink daily to aid with vitamin/mineral intake and he understands the high sodium amount. He continues to weigh regularly/monitor weight.  Jamieon will continue to benefit from participation in pulmonary rehab for nutrition, exercise, and lifestyle modification.      Intervention Plan   Intervention Prescribe, educate and counsel regarding individualized specific dietary modifications aiming towards targeted core components such as weight, hypertension, lipid management, diabetes, heart failure and other comorbidities.;Nutrition handout(s) given to patient.    Expected Outcomes Short Term Goal: Understand basic principles of dietary content, such as calories, fat, sodium, cholesterol and nutrients.;Long Term Goal: Adherence to prescribed nutrition plan.             Nutrition Assessments:  Nutrition Assessments - 10/20/22 1219       Rate Your Plate Scores   Pre Score 62            MEDIFICTS Score Key: ?70 Need to make  dietary changes  40-70 Heart Healthy Diet ? 40 Therapeutic Level Cholesterol Diet  Flowsheet Row PULMONARY REHAB OTHER RESPIRATORY from 10/20/2022 in [facility]  Picture Your Plate Total Score on Admission 62      Picture Your Plate Scores: <09 Unhealthy dietary pattern with much room for improvement. 41-50 Dietary pattern unlikely to meet recommendations for good health and room for improvement. 51-60 More healthful dietary pattern, with some room for improvement.  >60 Healthy dietary pattern, although there may be some specific behaviors that could be improved.    Nutrition Goals Re-Evaluation:  Nutrition Goals Re-Evaluation     Row Name 10/15/22 1151 11/12/22 1151 12/08/22 1130         Goals   Current Weight 168 lb 14 oz (76.6 kg) 171 lb 4.8 oz (77.7 kg)  he reports plan to take lasix when he gets home. 173 lb 1 oz (78.5 kg)     Comment lipids WNL He  is up 1.8# since starting with our program. Lipids WNL no new labs; most recent labs lipids WNL     Expected Outcome Patient with medical history of TIA, pulmonary hypertension, and congestive heart failure. Alferd reports eating three meals daily and stable/normal appetite. His diet consist of cereal, sandwiches, soup, v8, etc. He does drink two beers daily. Merwyn will continue to benefit from participation in pulmonary rehab for nutrition, exercise, and lifestyle modification. Goals in actoin. Damen reports following a low sodium diet and reading food labels for sodium. He does report 1 V8 drink daily to aid with vitamin/mineral intake and he understands the high sodium amount. He continues to weigh regularly and has follow-up with cardiology in April. He will meet with pallaitive care next week. Constant will continue to benefit from participation in pulmonary rehab for nutrition, exercise, and lifestyle modification. Goals in action.Satoshi has not attended pulmonary rehab  since 4/2, but he returns today after getting cleared by pulomonary following illness. Jamespaul continues following a low sodium diet and reading food labels for sodium. He does report 1 V8 drink daily to aid with vitamin/mineral intake and he understands the high sodium amount. He continues to weigh regularly/monitor weight. Raden will continue to benefit from participation in pulmonary rehab for nutrition, exercise, and lifestyle modification.              Nutrition Goals Discharge (Final Nutrition Goals Re-Evaluation):  Nutrition Goals Re-Evaluation - 12/08/22 1130       Goals   Current Weight 173 lb 1 oz (78.5 kg)    Comment no new labs; most recent labs lipids WNL    Expected Outcome Goals in action.Raydell has not attended pulmonary rehab since 4/2, but he returns today after getting cleared by pulomonary following illness. Wyat continues following a low sodium diet and reading food labels for sodium. He does report 1 V8 drink daily to aid with vitamin/mineral intake and he understands the high sodium amount. He continues to weigh regularly/monitor weight. Ianmichael will continue to benefit from participation in pulmonary rehab for nutrition, exercise, and lifestyle modification.             Psychosocial: Target Goals: Acknowledge presence or absence of significant depression and/or stress, maximize coping skills, provide positive support system. Participant is able to verbalize types and ability to use techniques and skills needed for reducing stress and depression.  Initial Review & Psychosocial Screening:  Initial Psych Review & Screening - 10/09/22 1328       Initial Review   Current issues with None Identified   meds have controlled sleep issues     Family Dynamics   Good Support System? Yes    Comments wife, daughter      Screening Interventions   Interventions Encouraged to exercise    Expected Outcomes Short Term goal: Utilizing psychosocial counselor, staff and  physician to assist with identification of specific Stressors or current issues interfering with healing process. Setting desired goal for each stressor or current issue identified.;Long Term Goal: Stressors or current issues are controlled or eliminated.;Short Term goal: Identification and review with participant of any Quality of Life or Depression concerns found by scoring the questionnaire.;Long Term goal: The participant improves quality of Life and PHQ9 Scores as seen by post scores and/or verbalization of changes             Quality of Life Scores:  Scores of 19 and below usually indicate a poorer quality of life in these areas.  A  difference of  2-3 points is a clinically meaningful difference.  A difference of 2-3 points in the total score of the Quality of Life Index has been associated with significant improvement in overall quality of life, self-image, physical symptoms, and general health in studies assessing change in quality of life.  PHQ-9: Review Flowsheet       10/09/2022  Depression screen PHQ 2/9  Decreased Interest 0  Down, Depressed, Hopeless 0  PHQ - 2 Score 0  Altered sleeping 0  Tired, decreased energy 0  Change in appetite 0  Feeling bad or failure about yourself  0  Trouble concentrating 0  Moving slowly or fidgety/restless 0  Suicidal thoughts 0  PHQ-9 Score 0  Difficult doing work/chores Not difficult at all   Interpretation of Total Score  Total Score Depression Severity:  1-4 = Minimal depression, 5-9 = Mild depression, 10-14 = Moderate depression, 15-19 = Moderately severe depression, 20-27 = Severe depression   Psychosocial Evaluation and Intervention:  Psychosocial Evaluation - 10/09/22 1330       Psychosocial Evaluation & Interventions   Interventions Stress management education;Relaxation education;Encouraged to exercise with the program and follow exercise prescription    Expected Outcomes Pt to participate in PR    Continue Psychosocial  Services  No Follow up required             Psychosocial Re-Evaluation:  Psychosocial Re-Evaluation     Row Name 10/21/22 1253 11/13/22 1026 12/23/22 0857         Psychosocial Re-Evaluation   Current issues with None Identified Current Stress Concerns Current Stress Concerns     Comments Ralston denies any psychosocial barriers at this time. Virat expressed that he really enjoys and loves the PR program. He stated that he knows that he doesn't have that much longer to live. 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 He acknowledges that everything is set up for when he passes. Rorey informed staff that his family is aware of his wishes, and he is meeting with a palliative team next week. He stated that he has accepted this, but he is stressed about his wife having to take over paying bills, money management, etc. He stated that this is something he has done his whole life. 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 He stated that he would like to get back to mowing his grass on his zero-turn mower and golfing. He stated that his family is not ready to "let him go". Ching is still enjoying the Pulmonary Rehab program. He is uncertain about his future and the longevity of his life but states that everything is "set up". He has come to terms with passing. He is more concerned about his family than himself. Quinterrius states that palliative is involved in his care. He declines a referral to a terminal mental health expert at this time.     Expected Outcomes For Sipriano to participate in PR free of psychosocial concerns For Merton to participate in PR free of psychosocial concerns For Mattia to continue to participate in PR free of psychosocial concerns or barriers     Interventions Encouraged to attend Pulmonary Rehabilitation for the exercise Encouraged to attend Pulmonary  Rehabilitation for the exercise;Relaxation education Encouraged to attend Pulmonary Rehabilitation for the exercise;Relaxation education     Continue Psychosocial Services  No Follow up required Follow up required by staff  We will continue to assess and monitor Mikie for any needs Follow up required by staff  We will continue to assess and monitor Bob for any needs       Initial Review   Source of Stress Concerns -- Chronic Illness;Unable to participate in former interests or hobbies;Unable to perform yard/household activities Chronic Illness;Unable to participate in former interests or hobbies;Unable to perform yard/household activities              Psychosocial Discharge (Final Psychosocial Re-Evaluation):  Psychosocial Re-Evaluation - 12/23/22 0857       Psychosocial Re-Evaluation   Current issues with Current Stress Concerns    Comments Marvie is still enjoying the Pulmonary Rehab program. He is uncertain about his future and the longevity of his life but states that everything is "set up". He has come to terms with passing. He is more concerned about his family than himself. Colbey states that palliative is involved in his care. He declines a referral to a terminal mental health expert at this time.    Expected Outcomes For Duc to continue to participate in PR free of psychosocial concerns or barriers    Interventions Encouraged to attend Pulmonary Rehabilitation for the exercise;Relaxation education    Continue Psychosocial Services  Follow up required by staff   We will continue to assess and monitor Jayshawn for any needs     Initial Review   Source of Stress Concerns Chronic Illness;Unable to participate in former interests or hobbies;Unable to perform yard/household activities             Education: Education Goals: Education classes will be provided on a weekly basis, covering required topics. Participant will state understanding/return demonstration of topics  presented.  Learning Barriers/Preferences:  Learning Barriers/Preferences - 10/09/22 1330       Learning Barriers/Preferences   Learning Barriers Exercise Concerns   hypoxia   Learning Preferences Audio;Verbal Instruction;Individual Instruction             Education Topics: Introduction to Pulmonary Rehab Group instruction provided by PowerPoint, verbal discussion, and written material to support subject matter. Instructor reviews what Pulmonary Rehab is, the purpose of the program, and how patients are referred.     Know Your Numbers Group instruction that is supported by a PowerPoint presentation. Instructor discusses importance of knowing and understanding resting, exercise, and post-exercise oxygen saturation, heart rate, and blood pressure. Oxygen saturation, heart rate, blood pressure, rating of perceived exertion, and dyspnea are reviewed along with a normal range for these values.    Exercise for the Pulmonary Patient Group instruction that is supported by a PowerPoint presentation. Instructor discusses benefits of exercise, core components of exercise, frequency, duration, and intensity of an exercise routine, importance of utilizing pulse oximetry during exercise, safety while exercising, and options of places to exercise outside of rehab.     MET Level  Group instruction provided by PowerPoint, verbal discussion, and written material to support subject matter. Instructor reviews what METs are and how to increase METs.  Flowsheet Row PULMONARY REHAB OTHER RESPIRATORY from 12/17/2022 in [facility]  Date 12/17/22  Educator EP  Instruction Review Code 1- Verbalizes Understanding       Pulmonary Medications Verbally interactive group education provided by instructor with focus on inhaled medications and proper administration. Flowsheet Row PULMONARY REHAB OTHER RESPIRATORY from 11/12/2022 in [facility]  Date 11/12/22  Educator  RT  Instruction Review Code 1- Chiropodist and Physiology of the Respiratory System Group instruction provided by PowerPoint, verbal discussion, and written material to support subject matter. Instructor reviews respiratory cycle and anatomical components of the respiratory system and their functions. Instructor also reviews differences in obstructive and restrictive respiratory diseases with examples of each.  Flowsheet Row PULMONARY REHAB OTHER RESPIRATORY from 10/22/2022 in [facility], [facility]  Date 10/22/22  Educator Ep  Instruction Review Code 1- Verbalizes Understanding       Oxygen Safety Group instruction provided by PowerPoint, verbal discussion, and written material to support subject matter. There is an overview of "What is Oxygen" and "Why do we need it".  Instructor also reviews how to create a safe environment for oxygen use, the importance of using oxygen as prescribed, and the risks of noncompliance. There is a brief discussion on traveling with oxygen and resources the patient may utilize.   Oxygen Use Group instruction provided by PowerPoint, verbal discussion, and written material to discuss how supplemental oxygen is prescribed and different types of oxygen supply systems. Resources for more information are provided.  Flowsheet Row PULMONARY REHAB OTHER RESPIRATORY from 12/10/2022 in [facility]  Date 12/10/22  Educator RT  Instruction Review Code 1- Verbalizes Understanding       Breathing Techniques Group instruction that is supported by demonstration and informational handouts. Instructor discusses the benefits of pursed lip and diaphragmatic breathing and detailed demonstration on how to perform both.  Flowsheet Row PULMONARY REHAB OTHER RESPIRATORY  from 12/24/2022 in [facility]  Date 12/24/22  Educator RT  Instruction Review Code 1- Verbalizes Understanding        Risk Factor Reduction Group instruction that is supported by a PowerPoint presentation. Instructor discusses the definition of a risk factor, different risk factors for pulmonary disease, and how the heart and lungs work together.   MD Day A group question and answer session with a medical doctor that allows participants to ask questions that relate to their pulmonary disease state.   Nutrition for the Pulmonary Patient Group instruction provided by PowerPoint slides, verbal discussion, and written materials to support subject matter. The instructor gives an explanation and review of healthy diet recommendations, which includes a discussion on weight management, recommendations for fruit and vegetable consumption, as well as protein, fluid, caffeine, fiber, sodium, sugar, and alcohol. Tips for eating when patients are short of breath are discussed.    Other Education Group or individual verbal, written, or video instructions that support the educational goals of the pulmonary rehab program.    Knowledge Questionnaire Score:  Knowledge Questionnaire Score - 10/09/22 1512       Knowledge Questionnaire Score   Pre Score 16/18             Core Components/Risk Factors/Patient Goals at Admission:  Personal Goals and Risk Factors at Admission - 10/09/22 1331       Core Components/Risk Factors/Patient Goals on Admission   Improve shortness of breath with ADL's Yes    Intervention Provide education, individualized exercise plan and daily activity instruction to help decrease symptoms of SOB with activities of daily living.    Expected Outcomes Short Term: Improve cardiorespiratory fitness to achieve a reduction of symptoms when performing ADLs;Long Term: Be able to perform more ADLs without symptoms  or delay  the onset of symptoms    Heart Failure Yes    Intervention Provide a combined exercise and nutrition program that is supplemented with education, support and counseling about heart failure. Directed toward relieving symptoms such as shortness of breath, decreased exercise tolerance, and extremity edema.    Expected Outcomes Improve functional capacity of life;Short term: Attendance in program 2-3 days a week with increased exercise capacity. Reported lower sodium intake. Reported increased fruit and vegetable intake. Reports medication compliance.;Short term: Daily weights obtained and reported for increase. Utilizing diuretic protocols set by physician.;Long term: Adoption of self-care skills and reduction of barriers for early signs and symptoms recognition and intervention leading to self-care maintenance.             Core Components/Risk Factors/Patient Goals Review:   Goals and Risk Factor Review     Row Name 10/21/22 1254 11/13/22 1057 12/23/22 0901 12/23/22 0905       Core Components/Risk Factors/Patient Goals Review   Personal Goals Review Develop more efficient breathing techniques such as purse lipped breathing and diaphragmatic breathing and practicing self-pacing with activity.;Increase knowledge of respiratory medications and ability to use respiratory devices properly.;Improve shortness of breath with ADL's Develop more efficient breathing techniques such as purse lipped breathing and diaphragmatic breathing and practicing self-pacing with activity.;Increase knowledge of respiratory medications and ability to use respiratory devices properly.;Improve shortness of breath with ADL's Develop more efficient breathing techniques such as purse lipped breathing and diaphragmatic breathing and practicing self-pacing with activity.;Increase knowledge of respiratory medications and ability to use respiratory devices properly.;Improve shortness of breath with ADL's --    Review Gibbs has  attended 2 PR classes so far. He is currently exercising on the recumbent bike and the Nustep. He has been able to increase his workload on the Nustep. He likes coming to class so far. He is practicing pursed lip breathing. His O2 had to be increased from 45%- 55% venti mask with sats 94-98% while exercising. We will continue to monitor Derrills progress throughout the program. Kaimana has attended 7 PR classes so far. He is currently exercising on the recumbent bike and the Nustep. He has been able to increase his workload & METs on the recumbent bike and increase his workload on the Nustep. He states he "loves" the program and "it is helping". He is independent on initiating pursed lip breathing. His oxygen saturation has decreased from 45%/10L to 6L HFNC. Romney has correctly stated when to use her inhaler and has properly demonstrated it with our respiratory therapist. He is compliant with taking his medications for his diseases and has attended educational classes on anatomy and physiology of the respiratory system class and pulmonary medications class. Lamond has met his goal of increasing his knowledge of respiratory meds and using them properly. He has correctly stated when to use his inhaler and has properly demonstrated it with our respiratory therapist. He has also met his goal of developing more efficient breathing techniques and self-pacing. Tarren can initiate pursed lip breathing when he is short of breath without staff involvement. Masaki is still working on improving his shortness of breath but feels like PR is helping. His oxygen saturation has maintained on 15L while exercising. --    Expected Outcomes See admission goals See admission goals See admission goals --             Core Components/Risk Factors/Patient Goals at Discharge (Final Review):   Goals and Risk Factor Review - 12/23/22 1610  Core Components/Risk Factors/Patient Goals Review   Personal Goals Review --     Expected Outcomes --             ITP Comments: Pt is making expected progress toward Pulmonary Rehab goals after completing 15 sessions. Recommend continued exercise, life style modification, education, and utilization of breathing techniques to increase stamina and strength, while also decreasing shortness of breath with exertion.    Comments: Dr. Mechele Collin is Medical Director for Pulmonary Rehab at [facility].

## 2022-12-31 ENCOUNTER — Encounter (HOSPITAL_COMMUNITY)
Admission: RE | Admit: 2022-12-31 | Discharge: 2022-12-31 | Disposition: A | Discharge status: Home / Self Care | Payer: Medicare Other | Source: Ambulatory Visit | Attending: Pulmonary Disease | Admitting: Pulmonary Disease

## 2022-12-31 ENCOUNTER — Telehealth (HOSPITAL_COMMUNITY): Payer: Self-pay

## 2022-12-31 DIAGNOSIS — I5032 Chronic diastolic (congestive) heart failure: Secondary | ICD-10-CM

## 2022-12-31 NOTE — Progress Notes (Signed)
Daily Session Note  Patient Details  Name: Frank Meyer MRN: [id number] Date of Birth: July 30, 1941 Referring Provider:   Doristine Devoid Pulmonary Rehab Walk Test from 10/09/2022 in [facility]  Referring Provider [place]       Encounter Date: 12/31/2022  Check In:  Session Check In - 12/31/22 1140       Check-In   Supervising physician immediately available to respond to emergencies [facility] MD immediately available    Physician(s) Eligha Bridegroom, NP    Location [facility]    Staff Present Samantha Belarus, RD, Dutch Gray, RN, BSN;Randi Reeve BS, ACSM-CEP, Exercise Physiologist;Cam Dauphin Earlene Plater, MS, ACSM-CEP, Exercise Physiologist;Casey Katrinka Blazing, Pennelope Bracken, RN, BSN    Virtual Visit No    Medication changes reported     No    Fall or balance concerns reported    No    Tobacco Cessation No Change    Warm-up and Cool-down Performed as group-led instruction    Resistance Training Performed Yes    VAD Patient? No    PAD/SET Patient? No      PAD/SET Patient   Completed foot check today? No    Open wounds to report? No      Pain Assessment   Currently in Pain? No/denies    Pain Score 0-No pain    Multiple Pain Sites No             Capillary Blood Glucose: No results found for this or any previous visit (from the past 24 hour(s)).    Social History   Tobacco Use  Smoking Status Former   Packs/day: 2.00   Years: 25.00   Additional pack years: 0.00   Total pack years: 50.00   Types: Cigarettes   Quit date: 48   Years since quitting: 30.3  Smokeless Tobacco Never    Goals Met:  Proper associated with RPD/PD & O2 Sat Exercise tolerated well No report of concerns or symptoms today Strength training completed today  Goals Unmet:  Not Applicable  Comments: Service time is from 1024 to 1150.    Dr. Mechele Collin is Medical Director for Pulmonary Rehab at [facility].

## 2022-12-31 NOTE — Progress Notes (Signed)
[facility] - Clinic Note  01/06/2023     CHIEF COMPLAINT Patient presents for Retina Follow Up   HISTORY OF PRESENT ILLNESS: Frank Meyer is a 82 y.o. male who presents to the clinic today for:   HPI     Retina Follow Up   Patient presents with  Wet AMD.  In both eyes.  Severity is moderate.  Duration of 8 weeks.  Since onset it is stable.  I, the attending physician,  performed the HPI with the patient and updated documentation appropriately.        Comments   Pt here for 8 wk ret f/u exu ARMD OU. Pt states VA is the same, no changes.       Last edited by Rennis Chris, MD on 01/06/2023  1:26 PM.      Referring physician: Geoffry Paradise, MD [address] [place],  [place] [address]  HISTORICAL INFORMATION:   Selected notes from the MEDICAL RECORD NUMBER [name] pt, transferring care due to insurance LEE: 09.12.23 -- IVA OU Ocular Hx- PMH-    CURRENT MEDICATIONS: No current outpatient medications on file. (Ophthalmic Drugs)   No current facility-administered medications for this visit. (Ophthalmic Drugs)   Current Outpatient Medications (Other)  Medication Sig   ANORO ELLIPTA 62.5-25 MCG/ACT AEPB USE 1 INHALATION BY MOUTH DAILY (Patient taking differently: Inhale 1 puff into the lungs daily.)   aspirin 81 MG EC tablet Take 81 mg by mouth daily.   atorvastatin (LIPITOR) 20 MG tablet Take 20 mg by mouth every evening.   cholecalciferol (VITAMIN D) 1000 UNITS tablet Take 1,000 Units by mouth daily.   donepezil (ARICEPT) 10 MG tablet Take 1 tablet (10 mg total) by mouth daily.   ENTRESTO 24-26 MG TAKE 1 TABLET BY MOUTH DAILY   fexofenadine (ALLEGRA) 180 MG tablet Take 180 mg by mouth daily.   finasteride (PROSCAR) 5 MG tablet Take 5 mg by mouth at bedtime.   furosemide (LASIX) 40 MG tablet Take 1 tablet (40 mg total) by mouth every other day. (Patient taking differently: Take 40 mg by mouth daily.)   ipratropium (ATROVENT) 0.06 % nasal spray  Place 1-2 sprays into both nostrils See admin instructions. 2 sprays in the right nostril and 1 spray in the left nostril once daily   ipratropium-albuterol (DUONEB) 0.5-2.5 (3) MG/3ML SOLN Take 3 mLs by nebulization 2 (two) times daily.   Melatonin 10 MG TABS Take 20 mg by mouth at bedtime.   omeprazole (PRILOSEC) 40 MG capsule Take 40 mg by mouth daily.    OXYGEN Inhale 2-5 L into the lungs as directed. 5lt during the day and 2lt at night   thiamine 100 MG tablet Take 1 tablet (100 mg total) by mouth daily.   traZODone (DESYREL) 100 MG tablet TAKE 2 TABLETS BY MOUTH AT  BEDTIME   levETIRAcetam (KEPPRA) 250 MG tablet Take 1-2 tablets (250-500 mg total) by mouth See admin instructions. Take 250 mg by mouth in the morning and 500 mg by mouth at night. (Patient taking differently: Take 250-500 mg by mouth See admin instructions. Take one tablet (250 mg) by mouth in the morning and two tablets (500 mg) by mouth at night.)   nitroGLYCERIN (NITROSTAT) 0.4 MG SL tablet Place 1 tablet (0.4 mg total) under the tongue every 5 (five) minutes as needed. Chest pain (Patient taking differently: Place 0.4 mg under the tongue every 5 (five) minutes as needed for chest pain. Chest pain)  No current facility-administered medications for this visit. (Other)   REVIEW OF SYSTEMS: ROS   Positive for: Cardiovascular, Eyes, Respiratory Negative for: Constitutional, Gastrointestinal, Neurological, Skin, Genitourinary, Musculoskeletal, HENT, Endocrine, Psychiatric, Allergic/Imm, Heme/Lymph Last edited by Thompson Grayer, COT on 01/06/2023 12:48 PM.       ALLERGIES No Known Allergies  PAST MEDICAL HISTORY Past Medical History:  Diagnosis Date   AKI (acute kidney injury) (HCC) 08/03/2022   Allergic rhinitis 10/24/2009   Altered bowel habits 10/04/2020   Basal cell carcinoma    Benign prostatic hyperplasia with lower urinary tract symptoms 10/24/2009   Benign prostatic hypertrophy    Bradycardia 12/06/2019    Callosity 10/16/2013   Cardiomegaly 01/23/2015   Chronic diastolic heart failure (HCC) 12/19/2018   Chronic respiratory failure (HCC) 10/04/2020   Coronary artery calcification seen on CAT scan 05/28/2020   Dyslipidemia    Dyspnea on exertion 03/18/2018   Elevated troponin 08/03/2022   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   Encounter for general adult medical examination without abnormal findings 01/17/2015   Epistaxis 09/18/2016   Ex-cigarette smoker 01/20/2021   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   Gallbladder problem    Gastroesophageal reflux disease    Hiatal hernia    Hilar adenopathy 03/25/2018   History of seizures 08/03/2022   History of smoking 03/18/2018   History of transient ischemic attack (TIA) 10/24/2009   Hyperlipidemia 10/24/2009   Insomnia 05/09/2013   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   Internal hemorrhoid 10/04/2020   Macular degeneration    Macular pucker, right eye 04/30/2020   Mediastinal adenopathy 03/25/2018   Memory loss 05/09/2013   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   Mild memory disturbance    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   Noninfective gastroenteritis and colitis, unspecified 10/04/2020   Nuclear sclerotic cataract of right eye 12/18/2019   Other specified symptoms and signs involving the digestive system and abdomen 10/04/2020   Overweight 09/14/2018   Oxygen dependent 10/04/2020   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   Personal history of colonic polyps 10/04/2020   Posterior vitreous detachment of right eye 12/18/2019   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   Pulmonary hypertension (HCC)    Right upper lobe pulmonary nodule 03/25/2018   Syncope and collapse 12/12/2018   TIA (transient ischemic attack)    Transient ischemic attack 10/24/2009   Vasomotor rhinitis  09/18/2016   Weight decreased 10/04/2020   Past Surgical History:  Procedure Laterality Date   CHOLECYSTECTOMY     none     FAMILY HISTORY Family History  Problem Relation Age of Onset   Obesity Mother    Memory loss Mother    SOCIAL HISTORY Social History   Tobacco Use   Smoking status: Former    Packs/day: 2.00    Years: 25.00    Additional pack years: 0.00    Total pack years: 50.00    Types: Cigarettes    Quit date: 29    Years since quitting: 30.4   Smokeless tobacco: Never  Substance Use Topics   Alcohol use: Yes    Alcohol/week: 15.0 - 20.0 standard drinks of alcohol    Types: 15 - 20 Cans of beer per week   Drug use: Never       OPHTHALMIC EXAM:  Base Eye Exam     Visual Acuity (Snellen - Linear)       Right Left   Dist  [place] 20/30 -1 20/20 -1   Dist ph [place] NI          Tonometry (Tonopen, 12:53 PM)       Right Left   Pressure 15 13         Pupils       Pupils Dark Light Shape React APD   Right PERRL 3 2 Round Brisk None   Left PERRL 3 2 Round Brisk None         Visual Fields (Counting fingers)       Left Right    Full Full         Extraocular Movement       Right Left    Full, Ortho Full, Ortho         Neuro/Psych     Oriented x3: Yes   Mood/Affect: Normal         Dilation     Both eyes: 1.0% Mydriacyl, 2.5% Phenylephrine @ 12:54 PM           Slit Lamp and Fundus Exam     External Exam       Right Left   External Normal Normal         Slit Lamp Exam       Right Left   Lids/Lashes Dermatochalasis - upper lid, Meibomian gland dysfunction Dermatochalasis - upper lid, Meibomian gland dysfunction   Conjunctiva/Sclera White and quiet White and quiet   Cornea arcus, 1-2+ Punctate epithelial erosions arcus, 1-2+ Punctate epithelial erosions, well healed cataract wound   Anterior Chamber deep and clear deep and clear   Iris Round and dilated Round and dilated   Lens 2-3+ Nuclear sclerosis with  brunescence, 2-3+ Cortical cataract PC IOL in good position   Anterior Vitreous Vitreous syneresis Vitreous syneresis, silicone oil microbubbles         Fundus Exam       Right Left   Posterior Vitreous Posterior vitreous detachment Posterior vitreous detachment, vitreous condensations   Disc Pink and Sharp Pink and Sharp   C/D Ratio 0.2 0.2   Macula Flat, Blunted foveal reflex, Drusen, RPE mottling and clumping, mild Atrophy temporal mac Flat, Blunted foveal reflex, RPE mottling, low PED/CNV with mild interval improvement in overlying edema, no frank heme   Vessels attenuated, mild tortuosity attenuated, Tortuous   Periphery Attached, mild reticular degeneration, No heme Attached, mild reticular degeneration, No heme, atrophic and pigmented CR scarring temporal periphery           IMAGING AND PROCEDURES  Imaging and Procedures for 01/06/2023  OCT, Retina - OU - Both Eyes       Right Eye Quality was borderline. Central Foveal Thickness: 317. Progression has been stable. Findings include normal foveal contour, no IRF, no SRF, retinal drusen , outer retinal atrophy (Focal ORA temporal macula, no fluid or edema, trace cystic changes vs schisis nasal mac).   Left Eye Quality was good. Central Foveal Thickness: 320. Progression has improved. Findings include normal foveal contour, no IRF, no SRF, retinal drusen , pigment epithelial detachment (Interval improvement in [facility], edema and foveal contour).   Notes *Images captured and stored on drive  Diagnosis / Impression:  OD: Focal ORA temporal macula, no fluid or edema trace cystic changes vs schisis nasal mac OS: Interval improvement in central SRHM, edema and foveal contour  Clinical management:  See below  Abbreviations: NFP - Normal foveal profile. CME - cystoid macular edema. PED - pigment epithelial detachment. IRF - intraretinal fluid.  SRF - subretinal fluid. EZ - ellipsoid zone. ERM - epiretinal membrane. ORA - outer  retinal atrophy. ORT - outer retinal tubulation. SRHM - subretinal hyper-reflective material. IRHM - intraretinal hyper-reflective material      Intravitreal Injection, Pharmacologic Agent - OS - Left Eye       Time Out 01/06/2023. 12:48 PM. Confirmed correct patient, procedure, site, and patient consented.   Anesthesia Topical anesthesia was used. Anesthetic medications included Lidocaine 2%, Proparacaine 0.5%.   Procedure Preparation included 5% betadine to ocular surface, eyelid speculum. A (32g) needle was used.   Injection: 1.25 mg Bevacizumab 1.25mg /0.10ml   Route: Intravitreal, Site: Left Eye   NDC: [id number], Lot: [id number], Expiration date: 04/17/2023   Post-op Post injection exam found visual acuity of at least counting fingers. The patient tolerated the procedure well. There were no complications. The patient received written and verbal post procedure care education.             ASSESSMENT/PLAN:    ICD-10-CM   1. 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 Combined forms of age-related cataract of right eye  H25.811     3. Pseudophakia  Z96.1      Exudative age related macular degeneration, both eyes  - Dr. Luciana Axe pt here due to insurance reasons - history of multiple IVA OU on q6-8 scheduled (last IVA OU --11.15.23 - Dr. Luciana Axe)   - s/p IVA OS #1 (01.22.24), #2 (03.27.24) - OCT shows OD: Focal ORA temporal macula, no fluid or edema trace cystic changes vs schisis nasal mac; OS: Interval improvement in [facility], edema and foveal contour at 8 weeks  - recommend IVA OS #3 today, 05.22.24 with f/u ext to 9 weeks; recommend holding IVA OD again  - pt wishes proceed with injection OS  - RBA of procedure discussed, questions answered - informed consent obtained and  signed - see procedure note  - f/u in 9 wks, sooner prn -- DFE/OCT, possible injection(s)  2. Mixed Cataract OD - The symptoms of cataract, surgical options, and treatments and risks were discussed with patient. - discussed diagnosis and progression - monitor  3. Pseudophakia OS  - s/p CE/IOL OS  - IOL in good position, doing well  - monitor  Ophthalmic Meds Ordered this visit:  Meds ordered this encounter  Medications   Bevacizumab (AVASTIN) SOLN 1.25 mg     Return in about 9 weeks (around 03/10/2023) for f/u exu ARMD OU, DFE, OCT.  There are no Patient Instructions on file for this visit.   Explained the diagnoses, plan, and follow up with the patient and they expressed understanding.  Patient expressed understanding of the importance of proper follow up care.   This document serves as a record of services personally performed by Karie Chimera, MD, PhD. It was created on their behalf by Annalee Genta, COMT. The creation of this record is the provider's dictation and/or activities during the visit.  Electronically signed by: Annalee Genta, COMT 01/06/23 1:27 PM  This document serves as a record of services personally performed by Karie Chimera, MD, PhD. It was created on their behalf by Glee Arvin. Manson Passey, OA an ophthalmic technician. The creation of this record is the provider's dictation and/or activities during the visit.    Electronically signed  by: Glee Arvin Alexander, [place] 05.22.2024 1:27 PM  Karie Chimera, M.D., Ph.D. Diseases & Surgery of the Retina and Vitreous [facility]  I have reviewed the above documentation for accuracy and completeness, and I agree with the above. Karie Chimera, M.D., Ph.D. 01/06/23 1:28 PM   Abbreviations: M myopia (nearsighted); A astigmatism; H hyperopia (farsighted); P presbyopia; Mrx spectacle prescription;  CTL contact lenses; OD right eye; OS left eye; OU both eyes  XT exotropia; ET esotropia; PEK punctate  epithelial keratitis; PEE punctate epithelial erosions; DES dry eye syndrome; MGD meibomian gland dysfunction; ATs artificial tears; PFAT's preservative free artificial tears; NSC nuclear sclerotic cataract; PSC posterior subcapsular cataract; ERM epi-retinal membrane; PVD posterior vitreous detachment; RD retinal detachment; DM diabetes mellitus; DR diabetic retinopathy; NPDR non-proliferative diabetic retinopathy; PDR proliferative diabetic retinopathy; CSME clinically significant macular edema; DME diabetic macular edema; dbh dot blot hemorrhages; CWS cotton wool spot; POAG primary open angle glaucoma; C/D cup-to-disc ratio; HVF humphrey visual field; GVF goldmann visual field; OCT optical coherence tomography; IOP intraocular pressure; BRVO Branch retinal vein occlusion; CRVO central retinal vein occlusion; CRAO central retinal artery occlusion; BRAO branch retinal artery occlusion; RT retinal tear; SB scleral buckle; PPV pars plana vitrectomy; VH Vitreous hemorrhage; PRP panretinal laser photocoagulation; IVK intravitreal kenalog; VMT vitreomacular traction; MH Macular hole;  NVD neovascularization of the disc; NVE neovascularization elsewhere; AREDS age related eye disease study; ARMD age related macular degeneration; POAG primary open angle glaucoma; EBMD epithelial/anterior basement membrane dystrophy; ACIOL anterior chamber intraocular lens; IOL intraocular lens; PCIOL posterior chamber intraocular lens; Phaco/IOL phacoemulsification with intraocular lens placement; PRK photorefractive keratectomy; LASIK laser assisted in situ keratomileusis; HTN hypertension; DM diabetes mellitus; COPD chronic obstructive pulmonary disease

## 2022-12-31 NOTE — Telephone Encounter (Signed)
Left message to schedule post 6 MWT.

## 2023-01-05 ENCOUNTER — Encounter (HOSPITAL_COMMUNITY)
Admission: RE | Admit: 2023-01-05 | Discharge: 2023-01-05 | Disposition: A | Discharge status: Home / Self Care | Payer: Medicare Other | Source: Ambulatory Visit | Attending: Pulmonary Disease | Admitting: Pulmonary Disease

## 2023-01-05 DIAGNOSIS — I5032 Chronic diastolic (congestive) heart failure: Secondary | ICD-10-CM

## 2023-01-05 NOTE — Progress Notes (Signed)
Daily Session Note  Patient Details  Name: Frank Meyer MRN: [id number] Date of Birth: April 19, 1941 Referring Provider:   Doristine Devoid Pulmonary Rehab Walk Test from 10/09/2022 in [facility] for Heart, Vascular, & Lung Health  Referring Provider [place]       Encounter Date: 01/05/2023  Check In:  Session Check In - 01/05/23 1132       Check-In   Supervising physician immediately available to respond to emergencies [facility] MD immediately available    Physician(s) Bernadene Person, NP    Location [facility]    Staff Present Samantha Belarus, RD, Dutch Gray, RN, BSN;Randi Reeve BS, ACSM-CEP, Exercise Physiologist;Kaylee Earlene Plater, MS, ACSM-CEP, Exercise Physiologist;Kylie Simmonds Katrinka Blazing, Pennelope Bracken, RN, BSN    Virtual Visit No    Medication changes reported     No    Fall or balance concerns reported    No    Tobacco Cessation No Change    Warm-up and Cool-down Performed as group-led instruction    Resistance Training Performed Yes    VAD Patient? No    PAD/SET Patient? No      Pain Assessment   Currently in Pain? No/denies    Multiple Pain Sites No             Capillary Blood Glucose: No results found for this or any previous visit (from the past 24 hour(s)).    Social History   Tobacco Use  Smoking Status Former   Packs/day: 2.00   Years: 25.00   Additional pack years: 0.00   Total pack years: 50.00   Types: Cigarettes   Quit date: 24   Years since quitting: 30.4  Smokeless Tobacco Never    Goals Met:  Proper associated with RPD/PD & O2 Sat Independence with exercise equipment Exercise tolerated well No report of concerns or symptoms today Strength training completed today  Goals Unmet:  Not Applicable  Comments: Service time is from 1015 to 1155.    Dr. Mechele Collin is Medical Director for Pulmonary Rehab at [facility].

## 2023-01-06 ENCOUNTER — Ambulatory Visit (INDEPENDENT_AMBULATORY_CARE_PROVIDER_SITE_OTHER): Payer: Medicare Other | Admitting: Ophthalmology

## 2023-01-06 ENCOUNTER — Encounter (INDEPENDENT_AMBULATORY_CARE_PROVIDER_SITE_OTHER): Payer: Self-pay | Admitting: Ophthalmology

## 2023-01-06 DIAGNOSIS — H353231 Exudative age-related macular degeneration, bilateral, with active choroidal neovascularization: Secondary | ICD-10-CM | POA: Diagnosis not present

## 2023-01-06 DIAGNOSIS — Z961 Presence of intraocular lens: Secondary | ICD-10-CM | POA: Diagnosis not present

## 2023-01-06 DIAGNOSIS — H25811 Combined forms of age-related cataract, right eye: Secondary | ICD-10-CM | POA: Diagnosis not present

## 2023-01-06 MED ORDER — BEVACIZUMAB CHEMO INJECTION 1.25MG/0.05ML SYRINGE FOR KALEIDOSCOPE
1.2500 mg | INTRAVITREAL | Status: AC | PRN
Start: 2023-01-06 — End: 2023-01-06
  Administered 2023-01-06: 1.25 mg via INTRAVITREAL

## 2023-01-07 ENCOUNTER — Ambulatory Visit
Admission: RE | Admit: 2023-01-07 | Discharge: 2023-01-07 | Disposition: A | Discharge status: Home / Self Care | Payer: Medicare Other | Source: Ambulatory Visit | Attending: Adult Health | Admitting: Adult Health

## 2023-01-07 ENCOUNTER — Encounter (HOSPITAL_COMMUNITY)
Admission: RE | Admit: 2023-01-07 | Discharge: 2023-01-07 | Disposition: A | Discharge status: Home / Self Care | Payer: Medicare Other | Source: Ambulatory Visit | Attending: Pulmonary Disease | Admitting: Pulmonary Disease

## 2023-01-07 DIAGNOSIS — J189 Pneumonia, unspecified organism: Secondary | ICD-10-CM

## 2023-01-07 DIAGNOSIS — I5032 Chronic diastolic (congestive) heart failure: Secondary | ICD-10-CM

## 2023-01-07 NOTE — Progress Notes (Signed)
Daily Session Note  Patient Details  Name: Frank Meyer MRN: [id number] Date of Birth: 19-Oct-1940 Referring Provider:   Doristine Devoid Pulmonary Rehab Walk Test from 10/09/2022 in [facility] for [facility], Vascular, & Lung Health  Referring Provider [place]       Encounter Date: 01/07/2023  Check In:  Session Check In - 01/07/23 1258       Check-In   Supervising physician immediately available to respond to emergencies [facility] MD immediately available    Physician(s) Robin Searing, NP    Location [facility]    Staff Present Samantha Belarus, RD, Dutch Gray, RN, BSN;Randi Dionisio Paschal, ACSM-CEP, Exercise Physiologist;Carlette Les Pou, RN, Doris Cheadle, MS, ACSM-CEP, Exercise Physiologist;Sharah Finnell Katrinka Blazing, RT    Virtual Visit No    Medication changes reported     No    Fall or balance concerns reported    No    Tobacco Cessation No Change    Warm-up and Cool-down Performed as group-led instruction    Resistance Training Performed Yes    VAD Patient? No    PAD/SET Patient? No      PAD/SET Patient   Completed foot check today? No      Pain Assessment   Currently in Pain? No/denies    Multiple Pain Sites No             Capillary Blood Glucose: No results found for this or any previous visit (from the past 24 hour(s)).    Social History   Tobacco Use  Smoking Status Former   Packs/day: 2.00   Years: 25.00   Additional pack years: 0.00   Total pack years: 50.00   Types: Cigarettes   Quit date: 39   Years since quitting: 30.4  Smokeless Tobacco Never    Goals Met:  Proper associated with RPD/PD & O2 Sat Independence with exercise equipment Exercise tolerated well No report of concerns or symptoms today Strength training completed today  Goals Unmet:  Not Applicable  Comments: Service time is from 1028 to 1157.    Dr. Mechele Collin is Medical Director for Pulmonary Rehab at [facility].

## 2023-01-08 ENCOUNTER — Encounter (HOSPITAL_COMMUNITY)
Admission: RE | Admit: 2023-01-08 | Discharge: 2023-01-08 | Disposition: A | Discharge status: Home / Self Care | Payer: Medicare Other | Source: Ambulatory Visit | Attending: Pulmonary Disease | Admitting: Pulmonary Disease

## 2023-01-08 DIAGNOSIS — I5032 Chronic diastolic (congestive) heart failure: Secondary | ICD-10-CM | POA: Diagnosis not present

## 2023-01-12 ENCOUNTER — Encounter (HOSPITAL_COMMUNITY)
Admission: RE | Admit: 2023-01-12 | Discharge: 2023-01-12 | Disposition: A | Discharge status: Home / Self Care | Payer: Medicare Other | Source: Ambulatory Visit | Attending: Pulmonary Disease | Admitting: Pulmonary Disease

## 2023-01-12 VITALS — Wt 170.0 lb

## 2023-01-12 DIAGNOSIS — I5032 Chronic diastolic (congestive) heart failure: Secondary | ICD-10-CM

## 2023-01-12 NOTE — Progress Notes (Signed)
Daily Session Note  Patient Details  Name: GLEASON BERBER MRN: [id number] Date of Birth: 1941/03/15 Referring Provider:   Doristine Devoid Pulmonary Rehab Walk Test from 10/09/2022 in [facility] for Heart, Vascular, & Lung Health  Referring Provider [place]       Encounter Date: 01/12/2023  Check In:  Session Check In - 01/12/23 1127       Check-In   Supervising physician immediately available to respond to emergencies [facility] MD immediately available    Physician(s) Robin Searing, NP    Location [facility]    Staff Present Samantha Belarus, RD, Dutch Gray, RN, BSN;Randi Dionisio Paschal, ACSM-CEP, Exercise Physiologist;Carlette Les Pou, RN, Doris Cheadle, MS, ACSM-CEP, Exercise Physiologist;Casey Katrinka Blazing, RT    Virtual Visit No    Medication changes reported     No    Fall or balance concerns reported    No    Tobacco Cessation No Change    Warm-up and Cool-down Performed as group-led instruction    Resistance Training Performed Yes    VAD Patient? No    PAD/SET Patient? No      Pain Assessment   Currently in Pain? No/denies    Multiple Pain Sites No             Capillary Blood Glucose: No results found for this or any previous visit (from the past 24 hour(s)).   Exercise Prescription Changes - 01/12/23 1200       Response to Exercise   Blood Pressure (Admit) 102/58    Blood Pressure (Exercise) 112/60    Blood Pressure (Exit) 94/60    Heart Rate (Admit) 88 bpm    Heart Rate (Exercise) 106 bpm    Heart Rate (Exit) 89 bpm    Oxygen Saturation (Admit) 97 %   8L   Oxygen Saturation (Exercise) 90 %   15L   Oxygen Saturation (Exit) 90 %   15L   Rating of Perceived Exertion (Exercise) 12    Perceived Dyspnea (Exercise) 1    Duration Continue with 30 min of aerobic exercise without signs/symptoms of physical distress.    Intensity THRR unchanged      Progression   Progression Continue to progress workloads to maintain intensity  without signs/symptoms of physical distress.      Resistance Training   Training Prescription Yes    Weight blue bands    Reps 10-15    Time 10 Minutes      Oxygen   Oxygen Continuous    Liters 8-15      Recumbant Bike   Level 3    Minutes 15    METs 3      NuStep   Level 4    Minutes 15    METs 2.5      Oxygen   Maintain Oxygen Saturation 88% or higher             Social History   Tobacco Use  Smoking Status Former   Packs/day: 2.00   Years: 25.00   Additional pack years: 0.00   Total pack years: 50.00   Types: Cigarettes   Quit date: 7   Years since quitting: 30.4  Smokeless Tobacco Never    Goals Met:  Independence with exercise equipment Improved SOB with ADL's Exercise tolerated well No report of concerns or symptoms today Strength training completed today  Goals Unmet:  Not Applicable  Comments: Service time is from 1005 to 1140    Dr. Erskine Squibb  Everardo All is Wellsite geologist for Pulmonary Rehab at [facility].

## 2023-01-14 ENCOUNTER — Encounter (HOSPITAL_COMMUNITY)
Admission: RE | Admit: 2023-01-14 | Discharge: 2023-01-14 | Disposition: A | Discharge status: Home / Self Care | Payer: Medicare Other | Source: Ambulatory Visit | Attending: Pulmonary Disease | Admitting: Pulmonary Disease

## 2023-01-14 DIAGNOSIS — I5032 Chronic diastolic (congestive) heart failure: Secondary | ICD-10-CM

## 2023-01-14 NOTE — Progress Notes (Addendum)
Daily Session Note  Patient Details  Name: Frank Meyer MRN: [id number] Date of Birth: 1941/02/14 Referring Provider:   Doristine Devoid Pulmonary Rehab Walk Test from 10/09/2022 in [facility] for Heart, Vascular, & Lung Health  Referring Provider [place]       Encounter Date: 01/14/2023  Check In:  Session Check In - 01/14/23 1205       Check-In   Supervising physician immediately available to respond to emergencies [facility] MD immediately available    Physician(s) Edd Fabian    Location [facility]    Staff Present Samantha Belarus, RD, Dutch Gray, RN, BSN;Randi Reeve BS, ACSM-CEP, Exercise Physiologist;Kaylee Earlene Plater, MS, ACSM-CEP, Exercise Physiologist;Makinsey Pepitone Chester Holstein, MS, Exercise Physiologist    Virtual Visit No    Medication changes reported     No    Fall or balance concerns reported    No    Tobacco Cessation No Change    Warm-up and Cool-down Performed as group-led instruction    Resistance Training Performed Yes    VAD Patient? No    PAD/SET Patient? No      PAD/SET Patient   Completed foot check today? No    Open wounds to report? No      Pain Assessment   Currently in Pain? No/denies    Pain Score 0-No pain    Multiple Pain Sites No             Capillary Blood Glucose: No results found for this or any previous visit (from the past 24 hour(s)).    Social History   Tobacco Use  Smoking Status Former   Packs/day: 2.00   Years: 25.00   Additional pack years: 0.00   Total pack years: 50.00   Types: Cigarettes   Quit date: 4   Years since quitting: 30.4  Smokeless Tobacco Never    Goals Met:  Proper associated with RPD/PD & O2 Sat Independence with exercise equipment Exercise tolerated well No report of concerns or symptoms today Strength training completed today  Goals Unmet:  Not Applicable  Comments: Service time is from 1010 to 1146.     Dr. Mechele Collin is Medical  Director for Pulmonary Rehab at [facility].

## 2023-01-14 NOTE — Progress Notes (Signed)
Discharge Progress Report  Patient Details  Name: Frank Meyer MRN: [id number] Date of Birth: Jan 23, 1941 Referring Provider:   Doristine Devoid Pulmonary Rehab Walk Test from 10/09/2022 in [facility]  Referring Provider Hunsucker        Number of Visits: 20  Reason for Discharge:  Patient has met program and personal goals.  Smoking History:  Social History   Tobacco Use  Smoking Status Former   Packs/day: 2.00   Years: 25.00   Additional pack years: 0.00   Total pack years: 50.00   Types: Cigarettes   Quit date: 32   Years since quitting: 30.4  Smokeless Tobacco Never    Diagnosis:  Chronic diastolic HF (heart failure) (HCC)  ADL [facility]:  Pulmonary Assessment Scores     Row Name 10/09/22 1336 01/08/23 1104 01/08/23 1121     ADL [facility]SD   ADL Phase Entry Exit --   SOB Score total 49 -- 31     CAT Score   CAT Score 17 -- 22     mMRC Score   mMRC Score 4 4 --            Initial Exercise Prescription:  Initial Exercise Prescription - 10/09/22 1500       Date of Initial Exercise RX and Referring Provider   Date 10/09/22    Referring Provider Hunsucker    Expected Discharge Date 01/07/23      Oxygen   Oxygen Continuous    Liters 10L/45 FiO2    Maintain Oxygen Saturation 88% or higher      Recumbant Bike   Level 1    RPM 70    Minutes 15    METs 1.5      NuStep   Level 1    SPM 70    Minutes 15    METs 1.5      Prescription Details   Frequency (times per week) 2    Duration Progress to 30 minutes of continuous aerobic without signs/symptoms of physical distress      Intensity   THRR 40-80% of Max Heartrate 56-111    Ratings of Perceived Exertion 11-13    Perceived Dyspnea 0-4      Progression   Progression Continue to progress workloads to maintain intensity without signs/symptoms of physical distress.      Resistance Training   Training Prescription Yes    Weight red  bands    Reps 10-15             Discharge Exercise Prescription (Final Exercise Prescription Changes):  Exercise Prescription Changes - 01/12/23 1200       Response to Exercise   Blood Pressure (Admit) 102/58    Blood Pressure (Exercise) 112/60    Blood Pressure (Exit) 94/60    Heart Rate (Admit) 88 bpm    Heart Rate (Exercise) 106 bpm    Heart Rate (Exit) 89 bpm    Oxygen Saturation (Admit) 97 %   8L   Oxygen Saturation (Exercise) 90 %   15L   Oxygen Saturation (Exit) 90 %   15L   Rating of Perceived Exertion (Exercise) 12    Perceived Dyspnea (Exercise) 1    Duration Continue with 30 min of aerobic exercise without signs/symptoms of physical distress.    Intensity THRR unchanged      Progression   Progression Continue to progress workloads to maintain intensity without signs/symptoms of physical distress.  Resistance Training   Training Prescription Yes    Weight blue bands    Reps 10-15    Time 10 Minutes      Oxygen   Oxygen Continuous    Liters 8-15      Recumbant Bike   Level 3    Minutes 15    METs 3      NuStep   Level 4    Minutes 15    METs 2.5      Oxygen   Maintain Oxygen Saturation 88% or higher             Functional Capacity:  6 Minute Walk     Row Name 10/09/22 1504 01/08/23 1101       6 Minute Walk   Phase Initial Discharge    Distance 730 feet 1105 feet    Distance % Change -- 51.7 %    Distance Feet Change -- 375 ft    Walk Time 6 minutes 6 minutes    # of Rest Breaks 1  2:40-4:10 due to hypoxia at 82 on 8L/40 FiO2 1  2:48-3:05    MPH 1.38 2.09    METS 1.06 1.99    RPE 9 12.5    Perceived Dyspnea  0 1    VO2 Peak 3.7 6.97    Symptoms No  pt sts not SOB No    Resting HR 91 bpm 91 bpm    Resting BP 100/64 102/64    Resting Oxygen Saturation  90 % 100 %    Exercise Oxygen Saturation  during 6 min walk 82 % 84 %    Max Ex. HR 105 bpm 115 bpm    Max Ex. BP 94/54 120/60    2 Minute Post BP 96/68 100/62       Interval HR   1 Minute HR 95 104    2 Minute HR 104 108    3 Minute HR 105 110    4 Minute HR 97 100    5 Minute HR 96 112    6 Minute HR 105 115    2 Minute Post HR 91 90    Interval Heart Rate? Yes Yes      Interval Oxygen   Interval Oxygen? Yes Yes    Baseline Oxygen Saturation % 90 % 100 %    1 Minute Oxygen Saturation % 95 % 98 %    1 Minute Liters of Oxygen 8 L 15 L    2 Minute Oxygen Saturation % 91 % 91 %    2 Minute Liters of Oxygen 8 L 15 L    3 Minute Oxygen Saturation % 85 % 85 %  84% @ 2:48    3 Minute Liters of Oxygen 10 L 15 L  increased 25L    4 Minute Oxygen Saturation % 90 % 97 %    4 Minute Liters of Oxygen 10 L 25 L    5 Minute Oxygen Saturation % 98 % 93 %    5 Minute Liters of Oxygen 10 L 25 L    6 Minute Oxygen Saturation % 94 % 93 %    6 Minute Liters of Oxygen 10 L 25 L    2 Minute Post Oxygen Saturation % 94 % 96 %    2 Minute Post Liters of Oxygen 10 L 25 L             Psychological, QOL, Others - Outcomes: PHQ 2/9:  01/08/2023   11:22 AM 10/09/2022    1:22 PM  Depression screen PHQ 2/9  Decreased Interest 1 0  Down, Depressed, Hopeless 0 0  PHQ - 2 Score 1 0  Altered sleeping 0 0  Tired, decreased energy 1 0  Change in appetite 1 0  Feeling bad or failure about yourself  0 0  Trouble concentrating 1 0  Moving slowly or fidgety/restless 0 0  Suicidal thoughts 0 0  PHQ-9 Score 4 0  Difficult doing work/chores Somewhat difficult Not difficult at all    Quality of Life:   Personal Goals: Goals established at orientation with interventions provided to work toward goal.  Personal Goals and Risk Factors at Admission - 10/09/22 1331       Core Components/Risk Factors/Patient Goals on Admission   Improve shortness of breath with ADL's Yes    Intervention Provide education, individualized exercise plan and daily activity instruction to help decrease symptoms of SOB with activities of daily living.    Expected Outcomes Short Term:  Improve cardiorespiratory fitness to achieve a reduction of symptoms when performing ADLs;Long Term: Be able to perform more ADLs without symptoms or delay the onset of symptoms    Heart Failure Yes    Intervention Provide a combined exercise and nutrition program that is supplemented with education, support and counseling about heart failure. Directed toward relieving symptoms such as shortness of breath, decreased exercise tolerance, and extremity edema.    Expected Outcomes Improve functional capacity of life;Short term: Attendance in program 2-3 days a week with increased exercise capacity. Reported lower sodium intake. Reported increased fruit and vegetable intake. Reports medication compliance.;Short term: Daily weights obtained and reported for increase. Utilizing diuretic protocols set by physician.;Long term: Adoption of self-care skills and reduction of barriers for early signs and symptoms recognition and intervention leading to self-care maintenance.              Personal Goals Discharge:  Goals and Risk Factor Review     Row Name 10/21/22 1254 11/13/22 1057 12/23/22 0901 12/23/22 0905       Core Components/Risk Factors/Patient Goals Review   Personal Goals Review Develop more efficient breathing techniques such as purse lipped breathing and diaphragmatic breathing and practicing self-pacing with activity.;Increase knowledge of respiratory medications and ability to use respiratory devices properly.;Improve shortness of breath with ADL's Develop more efficient breathing techniques such as purse lipped breathing and diaphragmatic breathing and practicing self-pacing with activity.;Increase knowledge of respiratory medications and ability to use respiratory devices properly.;Improve shortness of breath with ADL's Develop more efficient breathing techniques such as purse lipped breathing and diaphragmatic breathing and practicing self-pacing with activity.;Increase knowledge of respiratory  medications and ability to use respiratory devices properly.;Improve shortness of breath with ADL's --    Review Jaymond has attended 2 PR classes so far. He is currently exercising on the recumbent bike and the Nustep. He has been able to increase his workload on the Nustep. He likes coming to class so far. He is practicing pursed lip breathing. His O2 had to be increased from 45%- 55% venti mask with sats 94-98% while exercising. We will continue to monitor Derrills progress throughout the program. Ameer has attended 7 PR classes so far. He is currently exercising on the recumbent bike and the Nustep. He has been able to increase his workload & METs on the recumbent bike and increase his workload on the Nustep. He states he "loves" the program and "it is helping". He is independent  on initiating pursed lip breathing. His oxygen saturation has decreased from 45%/10L to 6L HFNC. Claron has correctly stated when to use her inhaler and has properly demonstrated it with our respiratory therapist. He is compliant with taking his medications for his diseases and has attended educational classes on anatomy and physiology of the respiratory system class and pulmonary medications class. Darus has met his goal of increasing his knowledge of respiratory meds and using them properly. He has correctly stated when to use his inhaler and has properly demonstrated it with our respiratory therapist. He has also met his goal of developing more efficient breathing techniques and self-pacing. Loye can initiate pursed lip breathing when he is short of breath without staff involvement. Jamarian is still working on improving his shortness of breath but feels like PR is helping. His oxygen saturation has maintained on 15L while exercising. --    Expected Outcomes See admission goals See admission goals See admission goals --             Exercise Goals and Review:  Exercise Goals     Row Name 10/09/22 1335 10/23/22 0801  11/16/22 1053 12/23/22 1055       Exercise Goals   Increase Physical Activity Yes Yes Yes Yes    Intervention Provide advice, education, support and counseling about physical activity/exercise needs.;Develop an individualized exercise prescription for aerobic and resistive training based on initial evaluation findings, risk stratification, comorbidities and participant's personal goals. Provide advice, education, support and counseling about physical activity/exercise needs.;Develop an individualized exercise prescription for aerobic and resistive training based on initial evaluation findings, risk stratification, comorbidities and participant's personal goals. Provide advice, education, support and counseling about physical activity/exercise needs.;Develop an individualized exercise prescription for aerobic and resistive training based on initial evaluation findings, risk stratification, comorbidities and participant's personal goals. Provide advice, education, support and counseling about physical activity/exercise needs.;Develop an individualized exercise prescription for aerobic and resistive training based on initial evaluation findings, risk stratification, comorbidities and participant's personal goals.    Expected Outcomes Short Term: Attend rehab on a regular basis to increase amount of physical activity.;Long Term: Add in home exercise to make exercise part of routine and to increase amount of physical activity.;Long Term: Exercising regularly at least 3-5 days a week. Short Term: Attend rehab on a regular basis to increase amount of physical activity.;Long Term: Add in home exercise to make exercise part of routine and to increase amount of physical activity.;Long Term: Exercising regularly at least 3-5 days a week. Short Term: Attend rehab on a regular basis to increase amount of physical activity.;Long Term: Add in home exercise to make exercise part of routine and to increase amount of physical  activity.;Long Term: Exercising regularly at least 3-5 days a week. Short Term: Attend rehab on a regular basis to increase amount of physical activity.;Long Term: Add in home exercise to make exercise part of routine and to increase amount of physical activity.;Long Term: Exercising regularly at least 3-5 days a week.    Increase Strength and Stamina Yes Yes Yes Yes    Intervention Provide advice, education, support and counseling about physical activity/exercise needs.;Develop an individualized exercise prescription for aerobic and resistive training based on initial evaluation findings, risk stratification, comorbidities and participant's personal goals. Provide advice, education, support and counseling about physical activity/exercise needs.;Develop an individualized exercise prescription for aerobic and resistive training based on initial evaluation findings, risk stratification, comorbidities and participant's personal goals. Provide advice, education, support and counseling about physical activity/exercise  needs.;Develop an individualized exercise prescription for aerobic and resistive training based on initial evaluation findings, risk stratification, comorbidities and participant's personal goals. Provide advice, education, support and counseling about physical activity/exercise needs.;Develop an individualized exercise prescription for aerobic and resistive training based on initial evaluation findings, risk stratification, comorbidities and participant's personal goals.    Expected Outcomes Short Term: Increase workloads from initial exercise prescription for resistance, speed, and METs.;Short Term: Perform resistance training exercises routinely during rehab and add in resistance training at home;Long Term: Improve cardiorespiratory fitness, muscular endurance and strength as measured by increased METs and functional capacity ( ) Short Term: Increase workloads from initial exercise prescription for  resistance, speed, and METs.;Short Term: Perform resistance training exercises routinely during rehab and add in resistance training at home;Long Term: Improve cardiorespiratory fitness, muscular endurance and strength as measured by increased METs and functional capacity ( ) Short Term: Increase workloads from initial exercise prescription for resistance, speed, and METs.;Short Term: Perform resistance training exercises routinely during rehab and add in resistance training at home;Long Term: Improve cardiorespiratory fitness, muscular endurance and strength as measured by increased METs and functional capacity ( ) Short Term: Increase workloads from initial exercise prescription for resistance, speed, and METs.;Short Term: Perform resistance training exercises routinely during rehab and add in resistance training at home;Long Term: Improve cardiorespiratory fitness, muscular endurance and strength as measured by increased METs and functional capacity ( )    Able to understand and use rate of perceived exertion (RPE) scale Yes Yes Yes Yes    Intervention Provide education and explanation on how to use RPE scale Provide education and explanation on how to use RPE scale Provide education and explanation on how to use RPE scale Provide education and explanation on how to use RPE scale    Expected Outcomes Short Term: Able to use RPE daily in rehab to express subjective intensity level;Long Term:  Able to use RPE to guide intensity level when exercising independently Short Term: Able to use RPE daily in rehab to express subjective intensity level;Long Term:  Able to use RPE to guide intensity level when exercising independently Short Term: Able to use RPE daily in rehab to express subjective intensity level;Long Term:  Able to use RPE to guide intensity level when exercising independently Short Term: Able to use RPE daily in rehab to express subjective intensity level;Long Term:  Able to use RPE to guide  intensity level when exercising independently    Able to understand and use Dyspnea scale Yes Yes Yes Yes    Intervention Provide education and explanation on how to use Dyspnea scale Provide education and explanation on how to use Dyspnea scale Provide education and explanation on how to use Dyspnea scale Provide education and explanation on how to use Dyspnea scale    Expected Outcomes Short Term: Able to use Dyspnea scale daily in rehab to express subjective sense of shortness of breath during exertion;Long Term: Able to use Dyspnea scale to guide intensity level when exercising independently Short Term: Able to use Dyspnea scale daily in rehab to express subjective sense of shortness of breath during exertion;Long Term: Able to use Dyspnea scale to guide intensity level when exercising independently Short Term: Able to use Dyspnea scale daily in rehab to express subjective sense of shortness of breath during exertion;Long Term: Able to use Dyspnea scale to guide intensity level when exercising independently Short Term: Able to use Dyspnea scale daily in rehab to express subjective sense of shortness of breath during exertion;Long Term: Able to  use Dyspnea scale to guide intensity level when exercising independently    Knowledge and understanding of Target Heart Rate Range (THRR) Yes Yes Yes Yes    Intervention Provide education and explanation of THRR including how the numbers were predicted and where they are located for reference Provide education and explanation of THRR including how the numbers were predicted and where they are located for reference Provide education and explanation of THRR including how the numbers were predicted and where they are located for reference Provide education and explanation of THRR including how the numbers were predicted and where they are located for reference    Expected Outcomes Short Term: Able to state/look up THRR;Long Term: Able to use THRR to govern intensity  when exercising independently;Short Term: Able to use daily as guideline for intensity in rehab Short Term: Able to state/look up THRR;Long Term: Able to use THRR to govern intensity when exercising independently;Short Term: Able to use daily as guideline for intensity in rehab Short Term: Able to state/look up THRR;Long Term: Able to use THRR to govern intensity when exercising independently;Short Term: Able to use daily as guideline for intensity in rehab Short Term: Able to state/look up THRR;Long Term: Able to use THRR to govern intensity when exercising independently;Short Term: Able to use daily as guideline for intensity in rehab    Understanding of Exercise Prescription Yes Yes Yes Yes    Intervention Provide education, explanation, and written materials on patient's individual exercise prescription Provide education, explanation, and written materials on patient's individual exercise prescription Provide education, explanation, and written materials on patient's individual exercise prescription Provide education, explanation, and written materials on patient's individual exercise prescription    Expected Outcomes Short Term: Able to explain program exercise prescription;Long Term: Able to explain home exercise prescription to exercise independently Short Term: Able to explain program exercise prescription;Long Term: Able to explain home exercise prescription to exercise independently Short Term: Able to explain program exercise prescription;Long Term: Able to explain home exercise prescription to exercise independently Short Term: Able to explain program exercise prescription;Long Term: Able to explain home exercise prescription to exercise independently             Exercise Goals Re-Evaluation:  Exercise Goals Re-Evaluation     Row Name 10/23/22 0801 11/16/22 1053 12/23/22 1055         Exercise Goal Re-Evaluation   Exercise Goals Review Increase Physical Activity;Increase Strength and  Stamina;Knowledge and understanding of Target Heart Rate Range (THRR);Able to understand and use rate of perceived exertion (RPE) scale;Able to understand and use Dyspnea scale;Understanding of Exercise Prescription Increase Physical Activity;Increase Strength and Stamina;Knowledge and understanding of Target Heart Rate Range (THRR);Able to understand and use rate of perceived exertion (RPE) scale;Able to understand and use Dyspnea scale;Understanding of Exercise Prescription Increase Physical Activity;Increase Strength and Stamina;Knowledge and understanding of Target Heart Rate Range (THRR);Able to understand and use rate of perceived exertion (RPE) scale;Able to understand and use Dyspnea scale;Understanding of Exercise Prescription     Comments Payson has completed 3 exercise sessions. He exercises for 15 min on the recumbent bike and Nustep. Nyshaun performs the warmup and cooldown standing without limitations. He is very deconditioned. It is too soon to notate any discernable progressions. Will continue to monitor and progress as able. Taryll has completed 7 exercise sessions. He exercises for 15 min on the recumbent bike and Nustep. He averages 2.3 METs at level 2 on the recumbent bike and 2.0 METs at level 3 on the Nustep.  Tawan performs the warmup and cooldown standing without limitations. He has increased his workloads for both exercise modes as he tolerated progressions well. Kristapher seems motivated to exercise. Will continue to monitor and progress as able. Perrion has completed 13 exercise sessions. He exercises for 15 min on the recumbent bike and Nustep. He averages 2.2 METs at level 2 on the recumbent bike and 2.2 METs at level 4 on the Nustep. Lucus performs the warmup and cooldown standing without limitations. He has increased his workload for the Nustep but not the recumbent bike. Gean has been difficult progress. He is most likely reaching his limit. I have discussed exercising at home  as he has resources for post rehab. Will continue to monitor and progress as able.     Expected Outcomes Through exercise at rehab and home, the patient will decrease shortness of breath with daily activities and feel confident in carrying out an exercise regimen at home. Through exercise at rehab and home, the patient will decrease shortness of breath with daily activities and feel confident in carrying out an exercise regimen at home. Through exercise at rehab and home, the patient will decrease shortness of breath with daily activities and feel confident in carrying out an exercise regimen at home.              Nutrition & Weight - Outcomes:    Nutrition:  Nutrition Therapy & Goals - 01/07/23 1429       Nutrition Therapy   Diet Heart healthy diet    Drug/Food Interactions Statins/Certain Fruits      Personal Nutrition Goals   Nutrition Goal Patient to reduce sodium intake 2300mg  per day    Personal Goal #2 Patient to improve diet quality by using the plate method as a daily guide for meal planning to include lean protein/plant protein, fruits, vegetables, whole grains, nonfat dairy as part of well balanced diet    Comments Goals in action. Nazier continues following a low sodium diet and reading food labels for sodium. He does report reducing V8 drink to ~4-6oz per day. He continues to weigh regularly/monitor weight. He recently increased Lasix and added miralax daily due to increased daily weights. Lorance will continue to benefit from participation in pulmonary rehab for nutrition, exercise, and lifestyle modification.      Intervention Plan   Intervention Prescribe, educate and counsel regarding individualized specific dietary modifications aiming towards targeted core components such as weight, hypertension, lipid management, diabetes, heart failure and other comorbidities.;Nutrition handout(s) given to patient.    Expected Outcomes Short Term Goal: Understand basic principles of  dietary content, such as calories, fat, sodium, cholesterol and nutrients.;Long Term Goal: Adherence to prescribed nutrition plan.             Nutrition Discharge:  Nutrition Assessments - 01/08/23 1215       Rate Your Plate Scores   Post Score 73             Education Questionnaire Score:  Knowledge Questionnaire Score - 01/08/23 1121       Knowledge Questionnaire Score   Post Score 15/18             Goals reviewed with patient; copy given to patient.

## 2023-01-15 ENCOUNTER — Encounter: Payer: Self-pay | Admitting: Pulmonary Disease

## 2023-01-15 ENCOUNTER — Ambulatory Visit: Payer: Medicare Other | Admitting: Pulmonary Disease

## 2023-01-15 ENCOUNTER — Telehealth: Payer: Self-pay

## 2023-01-15 VITALS — BP 114/64 | HR 79 | Ht 66.0 in | Wt 173.2 lb

## 2023-01-15 DIAGNOSIS — J9611 Chronic respiratory failure with hypoxia: Secondary | ICD-10-CM | POA: Diagnosis not present

## 2023-01-15 DIAGNOSIS — J439 Emphysema, unspecified: Secondary | ICD-10-CM

## 2023-01-15 DIAGNOSIS — J9612 Chronic respiratory failure with hypercapnia: Secondary | ICD-10-CM

## 2023-01-15 MED ORDER — ANORO ELLIPTA 62.5-25 MCG/ACT IN AEPB: 1.0000 | INHALATION_SPRAY | Freq: Every day | RESPIRATORY_TRACT | 11 refills | Status: DC

## 2023-01-15 NOTE — Patient Instructions (Addendum)
Nice to see you again  I refilled the Anoro, 1 puff once a day  Continue exercising as you are  Return to clinic in 6 months or sooner as needed with Dr. Judeth Horn

## 2023-01-15 NOTE — Telephone Encounter (Signed)
Patient Advocate Encounter   Received notification from [employer] Part D that prior authorization is required for Anoro Ellipta 62.5-25MCG/ACT aerosol powder   Submitted: n/a [place] [id number]

## 2023-01-15 NOTE — Progress Notes (Signed)
Who presents  @Patient  ID: Frank Meyer, male    DOB: [birth year], 82 y.o.   MRN: [id number]  Chief Complaint  Patient presents with   Follow-up    Pt is here following up after recent CT.  Pt states he has been doing okay since last visit and denies any real complaints.    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  Most recent pulmonary note reviewed.   Returns for routine follow-up.  Completed pulmonary rehab.  Exercising regularly [employer].  Requiring 10 L with exertion.  Able to manage this with multiple small tanks with exercise.  At rest using 4 L.  Feeling pretty good.  Needs refill of Anoro.  Discussed that he felt his quality of life is actually pretty good.  He felt being this most oxygen would be terrible but he is doing okay.  Staying active.  We discussed his wishes in the future, he would like to be hospitalized and improved if things can in the future.  But clearly expresses his wishes to be a DNR.  This is consistent with his recent CODE STATUS in the hospital.   HPI at initial visit:  Patient longstanding history of shortness of breath.  He is need oxygen for some years.  Uses up to 5 L with activity, 3-4 at rest.  His shortness of breath is described as severe.  Worse with exertion.  Somewhat relieved by rest over time.  No positional changes.  Does not seem to change with seasons, environment.  No other alleviating or exacerbating factors.  No history or family history I should say of pulmonary hypertension.  He denies any connective tissue disease.  No arthralgias, arthritis.  Denies Raynaud's phenomenon.  Never taken diet supplements.  Does not use methamphetamine.  No congenital heart disease per his report.  Extensive review of medical record performed today.  Recent high-res CT chest with severe centrilobular and paraseptal  emphysema on my review.  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  No evidence of ILD on my review.  TTE performed 11/2019.  This demonstrated mildly dilated LA, diastolic dysfunction, mild aortic regurgitation, mildly dilated RA, mildly enlarged RV with normal RV function, estimated elevated PASP.  Most recent PFTs 06/2020 demonstrated severely reduced DLCO 44% predicted with a surprisingly PFTs, TLC, no evidence of hyperinflation.  PMH: COPD, enlarged prostate, hyperlipidemia Surgical history: Reviewed in detail, he denies significant surgical history Family history: Memory loss in mother, denies significant pulmonary or respiratory issues and family Social history: Former smoker, 50-pack-year history, quit late 90s, retired   Public house manager / Pulmonary Flowsheets:   ACT:      No data to display           MMRC: mMRC Dyspnea Scale mMRC Score  09/26/2020 10:47 AM 0    Epworth:      No data to display           Tests:   FENO:  No results found for: "NITRICOXIDE"  PFT:    Latest Ref Rng & Units 06/26/2020    3:50 PM 04/27/2018    8:48 AM  PFT Results  FVC-Pre L 3.75  4.11   FVC-Predicted Pre % 111  119   FVC-Post L 3.85  4.07   FVC-Predicted Post % 113  118   Pre FEV1/FVC % % 78  79  Post FEV1/FCV % % 78  78   FEV1-Pre L 2.92  3.23   FEV1-Predicted Pre % 122  131   FEV1-Post L 3.02  3.20   DLCO uncorrected ml/min/mmHg 9.44  11.81   DLCO UNC% % 44  44   DLCO corrected ml/min/mmHg 9.61  11.92   DLCO COR %Predicted % 44  44   DLVA Predicted % 45  49   TLC L 5.75  5.24   TLC % Predicted % 92  84   RV % Predicted % 87  51   Personally reviewed and interpreted as normal spirometry, no bronchodilator spots, normal lung volumes, DLCO severely reduced  WALK:     01/08/2023   11:01 AM 11/18/2022    4:45 PM 10/09/2022    3:04 PM  SIX MIN WALK  Supplimental Oxygen during Test? (L/min)  Yes   O2 Flow Rate  6 L/min    Type  Continuous   2 Minute Oxygen Saturation % 91 %  91 %  2 Minute HR 108  104  4 Minute Oxygen Saturation % 97 %  90 %  4 Minute HR 100  97  6 Minute Oxygen Saturation % 93 %  94 %  6 Minute HR 115  105    Imaging: Personally reviewed and as per EMR discussion of this note  Lab Results: Personally reviewed, notably eosinophils as high as 400 06/2020 CBC    Component Value Date/Time   WBC 11.6 (H) 10/30/2022 0018   RBC 4.02 (L) 10/30/2022 0018   HGB 10.9 (L) 10/30/2022 0018   HCT 33.8 (L) 10/30/2022 0018   PLT 237 10/30/2022 0018   MCV 84.1 10/30/2022 0018   MCH 27.1 10/30/2022 0018   MCHC 32.2 10/30/2022 0018   RDW 14.4 10/30/2022 0018   LYMPHSABS 3.7 10/27/2022 1040   MONOABS 1.1 (H) 10/27/2022 1040   EOSABS 0.8 (H) 10/27/2022 1040   BASOSABS 0.2 (H) 10/27/2022 1040    BMET    Component Value Date/Time   NA 137 10/30/2022 0018   NA 142 10/06/2022 1111   K 4.1 10/30/2022 0018   CL 104 10/30/2022 0018   CO2 28 10/30/2022 0018   GLUCOSE 123 (H) 10/30/2022 0018   BUN 22 10/30/2022 0018   BUN 14 10/06/2022 1111   CREATININE 1.03 10/30/2022 0018   CALCIUM 8.3 (L) 10/30/2022 0018   GFRNONAA >60 10/30/2022 0018   GFRAA 64 06/13/2020 0819    BNP    Component Value Date/Time   BNP 125.4 (H) 10/27/2022 1040    ProBNP    Component Value Date/Time   PROBNP 223 10/15/2020 1632    Specialty Problems       Pulmonary Problems   Allergic rhinitis   Panlobular emphysema (HCC)   Chronic respiratory failure with hypoxia and hypercapnia (HCC)   Oxygen dependent   COPD exacerbation (HCC)   Community acquired bacterial pneumonia    CTA Chest 10/27/22:  Increased diffuse ground-glass and new consolidative airspace disease in the lung bases and peripheral posterior aspect of the right upper lobe, concerning for pneumonia.      Acute on chronic respiratory failure with hypoxia and hypercapnia (HCC)   Chronic obstructive pulmonary disease (HCC)   Multifocal  pneumonia    No Known Allergies  Immunization History  Administered Date(s) Administered   DTaP, 5 pertussis antigens 02/17/2016   Fluad Quad(high Dose 65+) 04/29/2019, 04/20/2020   Influenza Split 05/10/2012, 05/20/2013, 08/17/2013   Influenza, High Dose Seasonal PF 05/24/2017,  06/03/2018   Influenza-Unspecified 06/01/2015, 06/20/2016, 06/04/2018, 04/27/2019   PFIZER(Purple Top)SARS-COV-2 Vaccination 09/25/2019, 10/20/2019, 04/03/2020, 05/14/2020   Pneumococcal Conjugate-13 07/20/2014   Pneumococcal Polysaccharide-23 12/17/2009, 01/13/2013, 01/23/2015   Td (Adult),5 Lf Tetanus Toxid, Preservative Free 10/02/2004   Zoster, Live 01/05/2012, 01/12/2012    Past Medical History:  Diagnosis Date   AKI (acute kidney injury) (HCC) 08/03/2022   Allergic rhinitis 10/24/2009   Altered bowel habits 10/04/2020   Basal cell carcinoma    Benign prostatic hyperplasia with lower urinary tract symptoms 10/24/2009   Benign prostatic hypertrophy    Bradycardia 12/06/2019   Callosity 10/16/2013   Cardiomegaly 01/23/2015   Chronic diastolic heart failure (HCC) 12/19/2018   Chronic respiratory failure (HCC) 10/04/2020   Coronary artery calcification seen on CAT scan 05/28/2020   Dyslipidemia    Dyspnea on exertion 03/18/2018   Elevated troponin 08/03/2022   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   Encounter for general adult medical examination without abnormal findings 01/17/2015   Epistaxis 09/18/2016   Ex-cigarette smoker 01/20/2021   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   Gallbladder problem    Gastroesophageal reflux disease    Hiatal hernia    Hilar adenopathy 03/25/2018   History of seizures 08/03/2022   History of smoking 03/18/2018   History of transient ischemic attack (TIA) 10/24/2009   Hyperlipidemia 10/24/2009   Insomnia 05/09/2013   Intermediate stage nonexudative age-related macular degeneration of right  eye 07/30/2020   Internal hemorrhoid 10/04/2020   Macular degeneration    Macular pucker, right eye 04/30/2020   Mediastinal adenopathy 03/25/2018   Memory loss 05/09/2013   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   Mild memory disturbance    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   Noninfective gastroenteritis and colitis, unspecified 10/04/2020   Nuclear sclerotic cataract of right eye 12/18/2019   Other specified symptoms and signs involving the digestive system and abdomen 10/04/2020   Overweight 09/14/2018   Oxygen dependent 10/04/2020   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   Personal history of colonic polyps 10/04/2020   Posterior vitreous detachment of right eye 12/18/2019   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   Pulmonary hypertension (HCC)    Right upper lobe pulmonary nodule 03/25/2018   Syncope and collapse 12/12/2018   TIA (transient ischemic attack)    Transient ischemic attack 10/24/2009   Vasomotor rhinitis 09/18/2016   Weight decreased 10/04/2020    Tobacco History: Social History   Tobacco Use  Smoking Status Former   Packs/day: 2.00   Years: 25.00   Additional pack years: 0.00   Total pack years: 50.00   Types: Cigarettes   Quit date: 28   Years since quitting: 30.4  Smokeless Tobacco Never   Counseling given: Not Answered   Continue to not smoke  Outpatient Encounter Medications as of 01/15/2023  Medication Sig   aspirin 81 MG EC tablet Take 81 mg by mouth daily.   atorvastatin (LIPITOR) 20 MG tablet Take 20 mg by mouth every evening.   cholecalciferol (VITAMIN D) 1000 UNITS tablet Take 1,000 Units by mouth daily.   donepezil (ARICEPT) 10 MG tablet Take 1 tablet (10 mg total) by mouth daily.   ENTRESTO 24-26 MG TAKE 1 TABLET BY MOUTH DAILY   fexofenadine (ALLEGRA) 180 MG tablet Take 180 mg by mouth daily.   finasteride (PROSCAR) 5 MG tablet Take 5 mg by mouth at bedtime.   furosemide (LASIX) 40 MG tablet Take  1 tablet (40 mg total)  by mouth every other day. (Patient taking differently: Take 40 mg by mouth daily.)   ipratropium (ATROVENT) 0.06 % nasal spray Place 1-2 sprays into both nostrils See admin instructions. 2 sprays in the right nostril and 1 spray in the left nostril once daily   ipratropium-albuterol (DUONEB) 0.5-2.5 (3) MG/3ML SOLN Take 3 mLs by nebulization 2 (two) times daily.   Melatonin 10 MG TABS Take 20 mg by mouth at bedtime.   omeprazole (PRILOSEC) 40 MG capsule Take 40 mg by mouth daily.    OXYGEN Inhale 2-5 L into the lungs as directed. 5lt during the day and 2lt at night   thiamine 100 MG tablet Take 1 tablet (100 mg total) by mouth daily.   traZODone (DESYREL) 100 MG tablet TAKE 2 TABLETS BY MOUTH AT  BEDTIME   [DISCONTINUED] ANORO ELLIPTA 62.5-25 MCG/ACT AEPB USE 1 INHALATION BY MOUTH DAILY (Patient taking differently: Inhale 1 puff into the lungs daily.)   levETIRAcetam (KEPPRA) 250 MG tablet Take 1-2 tablets (250-500 mg total) by mouth See admin instructions. Take 250 mg by mouth in the morning and 500 mg by mouth at night. (Patient taking differently: Take 250-500 mg by mouth See admin instructions. Take one tablet (250 mg) by mouth in the morning and two tablets (500 mg) by mouth at night.)   nitroGLYCERIN (NITROSTAT) 0.4 MG SL tablet Place 1 tablet (0.4 mg total) under the tongue every 5 (five) minutes as needed. Chest pain (Patient taking differently: Place 0.4 mg under the tongue every 5 (five) minutes as needed for chest pain. Chest pain)   umeclidinium-vilanterol (ANORO ELLIPTA) 62.5-25 MCG/ACT AEPB Inhale 1 puff into the lungs daily.   No facility-administered encounter medications on file as of 01/15/2023.     Review of Systems  Review of Systems  n/a Physical Exam  BP 114/64 (BP Location: Left Arm, Patient Position: Sitting, Cuff Size: Normal)   Pulse 79   Ht 5\' 6"  (1.676 m)   Wt 173 lb 3.2 oz (78.6 kg)   SpO2 96% Comment: 6L cont  BMI 27.96 kg/m   Wt  Readings from Last 5 Encounters:  01/15/23 173 lb 3.2 oz (78.6 kg)  01/12/23 169 lb 15.6 oz (77.1 kg)  12/29/22 174 lb 9.7 oz (79.2 kg)  12/15/22 170 lb 10.2 oz (77.4 kg)  12/15/22 171 lb (77.6 kg)    BMI Readings from Last 5 Encounters:  01/15/23 27.96 kg/m  01/12/23 27.43 kg/m  12/29/22 28.18 kg/m  12/15/22 27.54 kg/m  12/15/22 27.60 kg/m     Physical Exam General: Well-appearing, sitting in chair Eyes: EOMI, no icterus Neck: Supple, no JVP appreciated Respiratory: Diminished throughout, normal work of breathing, no wheeze Cardiovascular: Regular rhythm, no murmur MSK: Tender to palpation of her right rib cage    Assessment & Plan:   Dyspnea  onexertion: Likely multifactorial.  Related to emphysema, suspected dynamic gas trapping although none demonstrated on the recent pulmonary function test.  Worsening the summer 2023 in the setting of decreased activity after starting seizure medication and inability to drive, play golf etc.  Suspect largest contributor to worsening is deconditioning and worsening lung function with pneumonia and COPD exacerbations.  Encouraged him to try to increase activity at home.  Continue Anoro, no improvement with Trelegy.  Completed pulmonary rehab with improvement.  Encouraged him to continue activity.  Lung nodule: Bilateral, right upper lobe most  suspicious.  Repeat CT scan 09/2022 with improvement, stable 12/2022.  Overall,his performance status is poor, high oxygen  requirement, poor candidate for therapies.  Could consider radiation empirically versus biopsy.  High risk biopsy was significant emphysema.  As well as significant hypoxemia.  Pulmonary hypertension, presumed: Based on TTE  11/2019.  No right heart cath in the past.  Suspect multifactorial.  Likely component of Group 2 disease given dilated LA, aortic valve insufficiency, diastolic dysfunction.  Likely largest component of group 3 disease given significant emphysema seen on CT as  well as chronic hypoxemic respiratory failure in the setting of emphysema. VQ scan negative. No OSA, nocturnal hypoxemia present. TTE 09/2020 with improved now normal PASP but new development mildly reduced RV function. New mild reduced LV function as well. Repeat TTE 12/2020 with improved RV and LV function in setting of oxygen therapy and entresto per cardiology.    No further therapy at this time.   Emphysema: significant on CT scan, with some evidence of worsening 09/2022.Marland Kitchen Spirometry WNL, no gas trapping, DLCO reduced. Albuterol not helpful.  Continue Anoro, Trelegy not helpful.  Chronic hypoxemic respiratory failure: Continue O2  10 - 12  L with exertion.  Using 4 L at rest, able to tolerate POC at rest and with driving.  Encouraged to maintain oxygen saturations especially with exertion.  Return in about 6 months (around 07/17/2023).   Karren Burly, MD 01/15/2023  I spent 41 minutes in care of patient including review of records, face-to-face visit, coordination of care.

## 2023-02-22 ENCOUNTER — Telehealth: Payer: Self-pay | Admitting: Pulmonary Disease

## 2023-02-22 NOTE — Telephone Encounter (Signed)
PT states on last appt Dr. was going to fill his Anoro at [facility] but somehow it went to [facility]. Please cancel [facility] and put thru to [facility].  PT # is [phone] if you have any questions. Thanks.

## 2023-02-24 MED ORDER — ANORO ELLIPTA 62.5-25 MCG/ACT IN AEPB: 1.0000 | INHALATION_SPRAY | Freq: Every day | RESPIRATORY_TRACT | 11 refills | Status: DC

## 2023-02-24 NOTE — Telephone Encounter (Signed)
Order has been into [facility]. Called and confirmed with pt, Nfn.

## 2023-03-04 ENCOUNTER — Telehealth: Payer: Self-pay | Admitting: Pulmonary Disease

## 2023-03-04 NOTE — Telephone Encounter (Signed)
Pam wife would like to know if there is a DME company that could supply patient oxygen for 10 liters. States has broken several machines. Patient currently uses [facility]. Pam phone number is [phone].

## 2023-03-04 NOTE — Telephone Encounter (Signed)
Called and spoke with Melissa, [facility].  Melissa stated after talking with operation manager [facility] is going to take a second concentrator out to patient and the tubing needed to splice together.  Nothing further at this time.

## 2023-03-04 NOTE — Telephone Encounter (Signed)
DME order was placed 10/15/22 to [facility] for 10 liters concentrator and travel tanks. [facility]ate[facility] had oximyzer ordered 10/22/22.  I called and spoke with Pam and she stated patient has received a concentrator that say it goes up to 10 liters O2, but according to the person that delivered a new concentrator yesterday, it says 10 liters, but doesn't go up to 10 liters. Pam stated they have been through 4 oxygen concentrators that are suppose to go up to 10 liters. Pam is requesting another DME,so patient can get the oxygen from the equipment needed. I called and spoke with Melissa, [employer]. Melissa stated she would have [facility] operation manager reach out to patient about concentrator.  Nothing further at this time.

## 2023-03-04 NOTE — Telephone Encounter (Signed)
Call back to speak to Frank Meyer

## 2023-03-05 ENCOUNTER — Telehealth: Payer: Self-pay | Admitting: Pulmonary Disease

## 2023-03-05 NOTE — Telephone Encounter (Signed)
Pam states [facility] does not have a concentrator that goes up to 10 liters. Can not have to concentrators in the home. Pam phone number is [phone].

## 2023-03-08 NOTE — Progress Notes (Signed)
[facility] - Clinic Note  03/10/2023     CHIEF COMPLAINT Patient presents for Retina Follow Up   HISTORY OF PRESENT ILLNESS: Frank LORANZO DESHA is a 82 y.o. male who presents to the clinic today for:   HPI     Retina Follow Up   Patient presents with  Wet AMD.  In both eyes.  Severity is moderate.  Duration of 9 weeks.  Since onset it is stable.  I, the attending physician,  performed the HPI with the patient and updated documentation appropriately.        Comments   Pt here for 9 wk ret f/u exu ARMD OU. Pt states [place], [place] [facility] has gotten more difficult.       Last edited by Rennis Chris, MD on 03/10/2023 10:23 PM.       Referring physician: Geoffry Paradise, MD [address] [place],  [place] [address]  HISTORICAL INFORMATION:   Selected notes from the MEDICAL RECORD NUMBER [name] pt, transferring care due to insurance LEE: 09.12.23 -- IVA OU Ocular Hx- PMH-    CURRENT MEDICATIONS: No current outpatient medications on file. (Ophthalmic Drugs)   No current facility-administered medications for this visit. (Ophthalmic Drugs)   Current Outpatient Medications (Other)  Medication Sig   aspirin 81 MG EC tablet Take 81 mg by mouth daily.   atorvastatin (LIPITOR) 20 MG tablet Take 20 mg by mouth every evening.   cholecalciferol (VITAMIN D) 1000 UNITS tablet Take 1,000 Units by mouth daily.   donepezil (ARICEPT) 10 MG tablet Take 1 tablet (10 mg total) by mouth daily.   ENTRESTO 24-26 MG TAKE 1 TABLET BY MOUTH DAILY   fexofenadine (ALLEGRA) 180 MG tablet Take 180 mg by mouth daily.   finasteride (PROSCAR) 5 MG tablet Take 5 mg by mouth at bedtime.   furosemide (LASIX) 40 MG tablet Take 1 tablet (40 mg total) by mouth every other day. (Patient taking differently: Take 40 mg by mouth daily.)   ipratropium (ATROVENT) 0.06 % nasal spray Place 1-2 sprays into both nostrils See admin instructions. 2 sprays in the right nostril and 1  spray in the left nostril once daily   ipratropium-albuterol (DUONEB) 0.5-2.5 (3) MG/3ML SOLN Take 3 mLs by nebulization 2 (two) times daily.   Melatonin 10 MG TABS Take 20 mg by mouth at bedtime.   omeprazole (PRILOSEC) 40 MG capsule Take 40 mg by mouth daily.    OXYGEN Inhale 2-5 L into the lungs as directed. 5lt during the day and 2lt at night   thiamine 100 MG tablet Take 1 tablet (100 mg total) by mouth daily.   traZODone (DESYREL) 100 MG tablet TAKE 2 TABLETS BY MOUTH AT  BEDTIME   umeclidinium-vilanterol (ANORO ELLIPTA) 62.5-25 MCG/ACT AEPB Inhale 1 puff into the lungs daily.   levETIRAcetam (KEPPRA) 250 MG tablet Take 1-2 tablets (250-500 mg total) by mouth See admin instructions. Take 250 mg by mouth in the morning and 500 mg by mouth at night. (Patient taking differently: Take 250-500 mg by mouth See admin instructions. Take one tablet (250 mg) by mouth in the morning and two tablets (500 mg) by mouth at night.)   nitroGLYCERIN (NITROSTAT) 0.4 MG SL tablet Place 1 tablet (0.4 mg total) under the tongue every 5 (five) minutes as needed. Chest pain (Patient taking differently: Place 0.4 mg under the tongue every 5 (five) minutes as needed for chest pain. Chest pain)   No current  facility-administered medications for this visit. (Other)   REVIEW OF SYSTEMS: ROS   Positive for: Cardiovascular, Eyes, Respiratory Negative for: Constitutional, Gastrointestinal, Neurological, Skin, Genitourinary, Musculoskeletal, HENT, Endocrine, Psychiatric, Allergic/Imm, Heme/Lymph Last edited by Thompson Grayer, COT on 03/10/2023 12:46 PM.     ALLERGIES No Known Allergies  PAST MEDICAL HISTORY Past Medical History:  Diagnosis Date   AKI (acute kidney injury) (HCC) 08/03/2022   Allergic rhinitis 10/24/2009   Altered bowel habits 10/04/2020   Basal cell carcinoma    Benign prostatic hyperplasia with lower urinary tract symptoms 10/24/2009   Benign prostatic hypertrophy    Bradycardia 12/06/2019    Callosity 10/16/2013   Cardiomegaly 01/23/2015   Chronic diastolic heart failure (HCC) 12/19/2018   Chronic respiratory failure (HCC) 10/04/2020   Coronary artery calcification seen on CAT scan 05/28/2020   Dyslipidemia    Dyspnea on exertion 03/18/2018   Elevated troponin 08/03/2022   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   Encounter for general adult medical examination without abnormal findings 01/17/2015   Epistaxis 09/18/2016   Ex-cigarette smoker 01/20/2021   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   Gallbladder problem    Gastroesophageal reflux disease    Hiatal hernia    Hilar adenopathy 03/25/2018   History of seizures 08/03/2022   History of smoking 03/18/2018   History of transient ischemic attack (TIA) 10/24/2009   Hyperlipidemia 10/24/2009   Insomnia 05/09/2013   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   Internal hemorrhoid 10/04/2020   Macular degeneration    Macular pucker, right eye 04/30/2020   Mediastinal adenopathy 03/25/2018   Memory loss 05/09/2013   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   Mild memory disturbance    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   Noninfective gastroenteritis and colitis, unspecified 10/04/2020   Nuclear sclerotic cataract of right eye 12/18/2019   Other specified symptoms and signs involving the digestive system and abdomen 10/04/2020   Overweight 09/14/2018   Oxygen dependent 10/04/2020   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   Personal history of colonic polyps 10/04/2020   Posterior vitreous detachment of right eye 12/18/2019   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   Pulmonary hypertension (HCC)    Right upper lobe pulmonary nodule 03/25/2018   Syncope and collapse 12/12/2018   TIA (transient ischemic attack)    Transient ischemic attack 10/24/2009   Vasomotor rhinitis  09/18/2016   Weight decreased 10/04/2020   Past Surgical History:  Procedure Laterality Date   CHOLECYSTECTOMY     none     FAMILY HISTORY Family History  Problem Relation Age of Onset   Obesity Mother    Memory loss Mother    SOCIAL HISTORY Social History   Tobacco Use   Smoking status: Former    Current packs/day: 0.00    Average packs/day: 2.0 packs/day for 25.0 years (50.0 ttl pk-yrs)    Types: Cigarettes    Start date: 39    Quit date: 1994    Years since quitting: 30.5   Smokeless tobacco: Never  Substance Use Topics   Alcohol use: Yes    Alcohol/week: 15.0 - 20.0 standard drinks of alcohol    Types: 15 - 20 Cans of beer per week   Drug use: Never       OPHTHALMIC EXAM:  Base Eye Exam     Visual Acuity (Snellen - Linear)       Right Left   Dist [place] 20/30 -3  20/30   Dist ph [place] 20/30 -1 NI         Tonometry (Tonopen, 12:57 PM)       Right Left   Pressure 12 12         Pupils       Pupils Dark Light Shape React APD   Right PERRL 3 2 Round Brisk None   Left PERRL 3 2 Round Brisk None         Visual Fields (Counting fingers)       Left Right    Full Full         Extraocular Movement       Right Left    Full, Ortho Full, Ortho         Neuro/Psych     Oriented x3: Yes   Mood/Affect: Normal         Dilation     Both eyes: 1.0% Mydriacyl, 2.5% Phenylephrine @ 12:57 PM           Slit Lamp and Fundus Exam     External Exam       Right Left   External Normal Normal         Slit Lamp Exam       Right Left   Lids/Lashes Dermatochalasis - upper lid, Meibomian gland dysfunction Dermatochalasis - upper lid, Meibomian gland dysfunction   Conjunctiva/Sclera White and quiet White and quiet   Cornea arcus, 1-2+ Punctate epithelial erosions arcus, 1-2+ Punctate epithelial erosions, well healed cataract wound   Anterior Chamber deep and clear deep and clear   Iris Round and dilated Round and dilated   Lens 2-3+ Nuclear  sclerosis with brunescence, 2-3+ Cortical cataract PC IOL in good position   Anterior Vitreous Vitreous syneresis Vitreous syneresis, silicone oil microbubbles         Fundus Exam       Right Left   Posterior Vitreous Posterior vitreous detachment Posterior vitreous detachment, vitreous condensations   Disc Pink and Sharp Pink and Sharp   C/D Ratio 0.2 0.2   Macula Flat, Blunted foveal reflex, Drusen, RPE mottling and clumping, mild Atrophy temporal mac Flat, Blunted foveal reflex, RPE mottling, low PED/CNV with mild interval increase in overlying edema, no frank heme   Vessels attenuated, mild tortuosity attenuated, Tortuous   Periphery Attached, mild reticular degeneration, No heme Attached, mild reticular degeneration, No heme, atrophic and pigmented CR scarring temporal periphery           Refraction     Manifest Refraction       Sphere Cylinder Axis Dist VA   Right       Left -0.50 +1.00 160 20/30+2           IMAGING AND PROCEDURES  Imaging and Procedures for 03/10/2023  OCT, Retina - OU - Both Eyes       Right Eye Quality was borderline. Central Foveal Thickness: 316. Progression has been stable. Findings include normal foveal contour, no IRF, no SRF, retinal drusen , outer retinal atrophy (Focal ORA temporal macula, no fluid or edema, trace cystic changes vs schisis nasal mac).   Left Eye Quality was borderline. Central Foveal Thickness: 335. Progression has worsened. Findings include no IRF, no SRF, abnormal foveal contour, retinal drusen , subretinal hyper-reflective material, pigment epithelial detachment (Mild interval increase in [facility] / edema overlying low PED).   Notes *Images captured and stored on drive  Diagnosis / Impression:  OD: Focal ORA temporal macula; no fluid or edema;  trace cystic changes vs schisis nasal mac OS: Mild interval increase in [facility] / edema overlying low PED  Clinical management:  See below  Abbreviations: NFP - Normal foveal  profile. CME - cystoid macular edema. PED - pigment epithelial detachment. IRF - intraretinal fluid. SRF - subretinal fluid. EZ - ellipsoid zone. ERM - epiretinal membrane. ORA - outer retinal atrophy. ORT - outer retinal tubulation. SRHM - subretinal hyper-reflective material. IRHM - intraretinal hyper-reflective material      Intravitreal Injection, Pharmacologic Agent - OS - Left Eye       Time Out 03/10/2023. 1:30 PM. Confirmed correct patient, procedure, site, and patient consented.   Anesthesia Topical anesthesia was used. Anesthetic medications included Lidocaine 2%, Proparacaine 0.5%.   Procedure Preparation included 5% betadine to ocular surface, eyelid speculum. A (32g) needle was used.   Injection: 1.25 mg Bevacizumab 1.25mg /0.35ml   Route: Intravitreal, Site: Left Eye   NDC: [id number], Lot: [id number], Expiration date: 05/24/2023   Post-op Post injection exam found visual acuity of at least counting fingers. The patient tolerated the procedure well. There were no complications. The patient received written and verbal post procedure care education.            ASSESSMENT/PLAN:    ICD-10-CM   1. 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 Combined forms of age-related cataract of right eye  H25.811     3. Pseudophakia  Z96.1      Exudative age related macular degeneration, both eyes  - Dr. Luciana Axe pt here due to insurance reasons - history of multiple IVA OU on q6-8 scheduled (last IVA OU --11.15.23 - Dr. Luciana Axe)   - s/p IVA OS #1 (01.22.24), #2 (03.27.24), #3 (05.22.24)  **history of increased SRHM/edema and decreased VA OS at 9 wks on 07.24.24**  - BCVA OD 20/30 - stable; OS 20/30 from 20/20 - OCT shows OD: Focal ORA temporal macula, no fluid or edema trace  cystic changes vs schisis nasal mac; OS: Mild interval increase in [facility] / edema overlying low PED at 9 weeks **discussed decreased efficacy / resistance to Avastin and potential benefit of switching medication**  - recommend IVA OS #4 today, 07.24.24 with f/u back to 8 weeks; recommend holding IVA OD again  - pt wishes proceed with injection OS  - RBA of procedure discussed, questions answered - informed consent obtained and signed - see procedure note - will check [employer] auth for next visit  - f/u in 8 wks, sooner prn -- DFE/OCT, possible injection(s)  2. Mixed Cataract OD - The symptoms of cataract, surgical options, and treatments and risks were discussed with patient. - discussed diagnosis and progression - monitor  3. Pseudophakia OS  - s/p CE/IOL OS  - IOL in good position, doing well  - monitor  Ophthalmic Meds Ordered this visit:  Meds ordered this encounter  Medications   Bevacizumab (AVASTIN) SOLN 1.25 mg     Return in about 8 weeks (around 05/05/2023) for f/u exu ARMD OU, DFE, OCT.  There are no Patient Instructions on file for this visit.   Explained the diagnoses, plan, and follow up with the patient and they expressed understanding.  Patient expressed understanding of the importance of proper follow up care.   This document serves as a record  of services personally performed by Karie Chimera, MD, PhD. It was created on their behalf by De Blanch, an ophthalmic technician. The creation of this record is the provider's dictation and/or activities during the visit.    Electronically signed by: De Blanch, OA, 03/10/23  10:25 PM  This document serves as a record of services personally performed by Karie Chimera, MD, PhD. It was created on their behalf by Glee Arvin. Manson Passey, OA an ophthalmic technician. The creation of this record is the provider's dictation and/or activities during the visit.    Electronically signed by: Glee Arvin. Manson Passey, OA 03/10/23 10:25  PM  Karie Chimera, M.D., Ph.D. Diseases & Surgery of the Retina and Vitreous [facility]  I have reviewed the above documentation for accuracy and completeness, and I agree with the above. Karie Chimera, M.D., Ph.D. 03/10/23 10:28 PM   Abbreviations: M myopia (nearsighted); A astigmatism; H hyperopia (farsighted); P presbyopia; Mrx spectacle prescription;  CTL contact lenses; OD right eye; OS left eye; OU both eyes  XT exotropia; ET esotropia; PEK punctate epithelial keratitis; PEE punctate epithelial erosions; DES dry eye syndrome; MGD meibomian gland dysfunction; ATs artificial tears; PFAT's preservative free artificial tears; NSC nuclear sclerotic cataract; PSC posterior subcapsular cataract; ERM epi-retinal membrane; PVD posterior vitreous detachment; RD retinal detachment; DM diabetes mellitus; DR diabetic retinopathy; NPDR non-proliferative diabetic retinopathy; PDR proliferative diabetic retinopathy; CSME clinically significant macular edema; DME diabetic macular edema; dbh dot blot hemorrhages; CWS cotton wool spot; POAG primary open angle glaucoma; C/D cup-to-disc ratio; HVF humphrey visual field; GVF goldmann visual field; OCT optical coherence tomography; IOP intraocular pressure; BRVO Branch retinal vein occlusion; CRVO central retinal vein occlusion; CRAO central retinal artery occlusion; BRAO branch retinal artery occlusion; RT retinal tear; SB scleral buckle; PPV pars plana vitrectomy; VH Vitreous hemorrhage; PRP panretinal laser photocoagulation; IVK intravitreal kenalog; VMT vitreomacular traction; MH Macular hole;  NVD neovascularization of the disc; NVE neovascularization elsewhere; AREDS age related eye disease study; ARMD age related macular degeneration; POAG primary open angle glaucoma; EBMD epithelial/anterior basement membrane dystrophy; ACIOL anterior chamber intraocular lens; IOL intraocular lens; PCIOL posterior chamber intraocular lens; Phaco/IOL  phacoemulsification with intraocular lens placement; PRK photorefractive keratectomy; LASIK laser assisted in situ keratomileusis; HTN hypertension; DM diabetes mellitus; COPD chronic obstructive pulmonary disease

## 2023-03-10 ENCOUNTER — Encounter (INDEPENDENT_AMBULATORY_CARE_PROVIDER_SITE_OTHER): Payer: Self-pay | Admitting: Ophthalmology

## 2023-03-10 ENCOUNTER — Ambulatory Visit (INDEPENDENT_AMBULATORY_CARE_PROVIDER_SITE_OTHER): Payer: Medicare Other | Admitting: Ophthalmology

## 2023-03-10 DIAGNOSIS — H25811 Combined forms of age-related cataract, right eye: Secondary | ICD-10-CM

## 2023-03-10 DIAGNOSIS — Z961 Presence of intraocular lens: Secondary | ICD-10-CM | POA: Diagnosis not present

## 2023-03-10 DIAGNOSIS — H353231 Exudative age-related macular degeneration, bilateral, with active choroidal neovascularization: Secondary | ICD-10-CM

## 2023-03-10 MED ORDER — BEVACIZUMAB CHEMO INJECTION 1.25MG/0.05ML SYRINGE FOR KALEIDOSCOPE
1.2500 mg | INTRAVITREAL | Status: AC | PRN
Start: 2023-03-10 — End: 2023-03-10
  Administered 2023-03-10: 1.25 mg via INTRAVITREAL

## 2023-03-10 NOTE — Telephone Encounter (Signed)
[name], LPN   LT   4/69/62  4:20 PM Note Called and spoke with Melissa, [employer].  Melissa stated after talking with operation manager [employer] is going to take a second concentrator out to patient and the tubing needed to splice together.  Nothing further at this time.

## 2023-04-27 ENCOUNTER — Other Ambulatory Visit: Payer: Self-pay | Admitting: Pulmonary Disease

## 2023-04-29 ENCOUNTER — Telehealth: Payer: Self-pay | Admitting: Pulmonary Disease

## 2023-04-29 NOTE — Telephone Encounter (Signed)
Patient requesting to change from Dr. Judeth Horn due to Dr. Francine Graven. Patient saw Dr. Francine Graven in the hospital. Please advise on switch.

## 2023-05-04 NOTE — Telephone Encounter (Signed)
Ok with switching providers.  Thanks, JD

## 2023-05-04 NOTE — Progress Notes (Signed)
[facility] - Clinic Note  05/05/2023     CHIEF COMPLAINT Patient presents for Retina Follow Up   HISTORY OF PRESENT ILLNESS: Frank Meyer is a 82 y.o. male who presents to the clinic today for:   HPI     Retina Follow Up   Patient presents with  Wet AMD.  In both eyes.  This started 8 weeks ago.  I, the attending physician,  performed the HPI with the patient and updated documentation appropriately.        Comments   Patient here for 8 weeks retina follow up for exu ARMD OU. Patient states vision doing good, no eye pain.       Last edited by Rennis Chris, MD on 05/05/2023 11:45 AM.     Referring physician: Geoffry Paradise, MD [address] [place],  [place] [address]  HISTORICAL INFORMATION:   Selected notes from the MEDICAL RECORD NUMBER [name] pt, transferring care due to insurance LEE: 09.12.23 -- IVA OU Ocular Hx- PMH-    CURRENT MEDICATIONS: No current outpatient medications on file. (Ophthalmic Drugs)   No current facility-administered medications for this visit. (Ophthalmic Drugs)   Current Outpatient Medications (Other)  Medication Sig   aspirin 81 MG EC tablet Take 81 mg by mouth daily.   atorvastatin (LIPITOR) 20 MG tablet Take 20 mg by mouth every evening.   cholecalciferol (VITAMIN D) 1000 UNITS tablet Take 1,000 Units by mouth daily.   donepezil (ARICEPT) 10 MG tablet Take 1 tablet (10 mg total) by mouth daily.   ENTRESTO 24-26 MG TAKE 1 TABLET BY MOUTH DAILY   fexofenadine (ALLEGRA) 180 MG tablet Take 180 mg by mouth daily.   finasteride (PROSCAR) 5 MG tablet Take 5 mg by mouth at bedtime.   furosemide (LASIX) 40 MG tablet Take 1 tablet (40 mg total) by mouth every other day. (Patient taking differently: Take 40 mg by mouth daily.)   ipratropium (ATROVENT) 0.06 % nasal spray Place 1-2 sprays into both nostrils See admin instructions. 2 sprays in the right nostril and 1 spray in the left nostril once daily    ipratropium-albuterol (DUONEB) 0.5-2.5 (3) MG/3ML SOLN USE 1 VIAL VIA NEBULIZER TWICE  DAILY   Melatonin 10 MG TABS Take 20 mg by mouth at bedtime.   omeprazole (PRILOSEC) 40 MG capsule Take 40 mg by mouth daily.    OXYGEN Inhale 2-5 L into the lungs as directed. 5lt during the day and 2lt at night   thiamine 100 MG tablet Take 1 tablet (100 mg total) by mouth daily.   traZODone (DESYREL) 100 MG tablet TAKE 2 TABLETS BY MOUTH AT  BEDTIME   umeclidinium-vilanterol (ANORO ELLIPTA) 62.5-25 MCG/ACT AEPB Inhale 1 puff into the lungs daily.   levETIRAcetam (KEPPRA) 250 MG tablet Take 1-2 tablets (250-500 mg total) by mouth See admin instructions. Take 250 mg by mouth in the morning and 500 mg by mouth at night. (Patient taking differently: Take 250-500 mg by mouth See admin instructions. Take one tablet (250 mg) by mouth in the morning and two tablets (500 mg) by mouth at night.)   nitroGLYCERIN (NITROSTAT) 0.4 MG SL tablet Place 1 tablet (0.4 mg total) under the tongue every 5 (five) minutes as needed. Chest pain (Patient taking differently: Place 0.4 mg under the tongue every 5 (five) minutes as needed for chest pain. Chest pain)   No current facility-administered medications for this visit. (Other)   REVIEW OF SYSTEMS: ROS   Positive for:  Cardiovascular, Eyes, Respiratory Negative for: Constitutional, Gastrointestinal, Neurological, Skin, Genitourinary, Musculoskeletal, HENT, Endocrine, Psychiatric, Allergic/Imm, Heme/Lymph Last edited by Laddie Aquas, COA on 05/05/2023 10:06 AM.      ALLERGIES No Known Allergies  PAST MEDICAL HISTORY Past Medical History:  Diagnosis Date   AKI (acute kidney injury) (HCC) 08/03/2022   Allergic rhinitis 10/24/2009   Altered bowel habits 10/04/2020   Basal cell carcinoma    Benign prostatic hyperplasia with lower urinary tract symptoms 10/24/2009   Benign prostatic hypertrophy    Bradycardia 12/06/2019   Callosity 10/16/2013   Cardiomegaly 01/23/2015    Chronic diastolic heart failure (HCC) 12/19/2018   Chronic respiratory failure (HCC) 10/04/2020   Coronary artery calcification seen on CAT scan 05/28/2020   Dyslipidemia    Dyspnea on exertion 03/18/2018   Elevated troponin 08/03/2022   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   Encounter for general adult medical examination without abnormal findings 01/17/2015   Epistaxis 09/18/2016   Ex-cigarette smoker 01/20/2021   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   Gallbladder problem    Gastroesophageal reflux disease    Hiatal hernia    Hilar adenopathy 03/25/2018   History of seizures 08/03/2022   History of smoking 03/18/2018   History of transient ischemic attack (TIA) 10/24/2009   Hyperlipidemia 10/24/2009   Insomnia 05/09/2013   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   Internal hemorrhoid 10/04/2020   Macular degeneration    Macular pucker, right eye 04/30/2020   Mediastinal adenopathy 03/25/2018   Memory loss 05/09/2013   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   Mild memory disturbance    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   Noninfective gastroenteritis and colitis, unspecified 10/04/2020   Nuclear sclerotic cataract of right eye 12/18/2019   Other specified symptoms and signs involving the digestive system and abdomen 10/04/2020   Overweight 09/14/2018   Oxygen dependent 10/04/2020   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   Personal history of colonic polyps 10/04/2020   Posterior vitreous detachment of right eye 12/18/2019   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   Pulmonary hypertension (HCC)    Right upper lobe pulmonary nodule 03/25/2018   Syncope and collapse 12/12/2018   TIA (transient ischemic attack)    Transient ischemic attack 10/24/2009   Vasomotor rhinitis 09/18/2016   Weight decreased 10/04/2020   Past  Surgical History:  Procedure Laterality Date   CHOLECYSTECTOMY     none     FAMILY HISTORY Family History  Problem Relation Age of Onset   Obesity Mother    Memory loss Mother    SOCIAL HISTORY Social History   Tobacco Use   Smoking status: Former    Current packs/day: 0.00    Average packs/day: 2.0 packs/day for 25.0 years (50.0 ttl pk-yrs)    Types: Cigarettes    Start date: 65    Quit date: 1994    Years since quitting: 30.7   Smokeless tobacco: Never  Vaping Use   Vaping status: Never Used  Substance Use Topics   Alcohol use: Yes    Alcohol/week: 15.0 - 20.0 standard drinks of alcohol    Types: 15 - 20 Cans of beer per week   Drug use: Never       OPHTHALMIC EXAM:  Base Eye Exam     Visual Acuity (Snellen - Linear)       Right Left   Dist [place] 20/30 20/30 -2   Dist ph [place]  NI 20/30 +1         Tonometry (Tonopen, 10:04 AM)       Right Left   Pressure 06 05         Pupils       Dark Light Shape React APD   Right 3 2 Round Brisk None   Left 3 2 Round Brisk None         Visual Fields (Counting fingers)       Left Right    Full Full         Extraocular Movement       Right Left    Full, Ortho Full, Ortho         Neuro/Psych     Oriented x3: Yes   Mood/Affect: Normal         Dilation     Both eyes: 1.0% Mydriacyl, 2.5% Phenylephrine @ 10:04 AM           Slit Lamp and Fundus Exam     External Exam       Right Left   External Normal Normal         Slit Lamp Exam       Right Left   Lids/Lashes Dermatochalasis - upper lid, Meibomian gland dysfunction Dermatochalasis - upper lid, Meibomian gland dysfunction   Conjunctiva/Sclera White and quiet White and quiet   Cornea arcus, 1-2+ Punctate epithelial erosions arcus, 1-2+ Punctate epithelial erosions, well healed cataract wound   Anterior Chamber deep and clear deep and clear   Iris Round and dilated Round and dilated   Lens 2-3+ Nuclear sclerosis with brunescence,  2-3+ Cortical cataract PC IOL in good position   Anterior Vitreous Vitreous syneresis Vitreous syneresis, silicone oil microbubbles         Fundus Exam       Right Left   Posterior Vitreous Posterior vitreous detachment Posterior vitreous detachment, vitreous condensations   Disc Pink and Sharp Pink and Sharp   C/D Ratio 0.2 0.2   Macula Flat, good foveal reflex, Drusen, RPE mottling and clumping, mild Atrophy temporal mac Flat, Blunted foveal reflex, RPE mottling, low PED/CNV with mild interval improvement in overlying edema, no frank heme   Vessels attenuated, mild tortuosity attenuated, Tortuous   Periphery Attached, mild reticular degeneration, No heme Attached, mild reticular degeneration, No heme, atrophic and pigmented CR scarring temporal periphery           IMAGING AND PROCEDURES  Imaging and Procedures for 05/05/2023  OCT, Retina - OU - Both Eyes       Right Eye Quality was borderline. Central Foveal Thickness: 312. Progression has been stable. Findings include normal foveal contour, no IRF, no SRF, retinal drusen , outer retinal atrophy (Focal ORA temporal macula, no fluid or edema, trace cystic changes vs schisis nasal mac).   Left Eye Quality was borderline. Central Foveal Thickness: 311. Progression has improved. Findings include no IRF, no SRF, abnormal foveal contour, retinal drusen , subretinal hyper-reflective material, pigment epithelial detachment (Interval improvement in [facility] / edema overlying low PED nasal fovea/macula).   Notes *Images captured and stored on drive  Diagnosis / Impression:  OD: Focal ORA temporal macula; no fluid or edema; trace cystic changes vs schisis nasal mac OS: Interval improvement in [facility] / edema overlying low PED nasal fovea/macula  Clinical management:  See below  Abbreviations: NFP - Normal foveal profile. CME - cystoid macular edema. PED - pigment epithelial detachment. IRF - intraretinal fluid. SRF - subretinal fluid.  EZ -  ellipsoid zone. ERM - epiretinal membrane. ORA - outer retinal atrophy. ORT - outer retinal tubulation. SRHM - subretinal hyper-reflective material. IRHM - intraretinal hyper-reflective material      Intravitreal Injection, Pharmacologic Agent - OS - Left Eye       Time Out 05/05/2023. 11:27 AM. Confirmed correct patient, procedure, site, and patient consented.   Anesthesia Topical anesthesia was used. Anesthetic medications included Lidocaine 2%, Proparacaine 0.5%.   Procedure Preparation included 5% betadine to ocular surface, eyelid speculum. A supplied (32g) needle was used.   Injection: 1.25 mg Bevacizumab 1.25mg /0.67ml   Route: Intravitreal, Site: Left Eye   NDC: 16109-604-54, Lot: [id number] , Expiration date: 05/28/2023   Post-op Post injection exam found visual acuity of at least counting fingers. The patient tolerated the procedure well. There were no complications. The patient received written and verbal post procedure care education.             ASSESSMENT/PLAN:    ICD-10-CM   1. 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 Combined forms of age-related cataract of right eye  H25.811     3. Pseudophakia  Z96.1      Exudative age related macular degeneration, both eyes  - Dr. Luciana Axe pt here due to insurance reasons - history of multiple IVA OU on q6-8 scheduled (last IVA OU --11.15.23 - Dr. Luciana Axe)   - s/p IVA OS #1 (01.22.24), #2 (03.27.24), #3 (05.22.24), #4 (07.24.24) **history of increased SRHM/edema and decreased VA OS at 9 wks on 07.24.24**  - BCVA OU 20/30 - stable - OCT shows OD: Focal ORA temporal macula, no fluid or edema trace cystic changes vs schisis nasal mac; OS: Interval improvement in [facility] / edema overlying low PED nasal fovea/macula at 8  weeks **discussed decreased efficacy / resistance to Avastin and potential benefit of switching medication** - recommend IVA OS #5 today, 09.18.24 with f/u in 8 weeks again - recommend holding IVA OD again -- pt in agreement  - pt wishes proceed with injection OS  - RBA of procedure discussed, questions answered - informed consent obtained and signed 01.22.24 (OS) - see procedure note - Eylea approved for 2024  - f/u in 8 wks, sooner prn -- DFE/OCT, possible injection(s)  2. Mixed Cataract OD - The symptoms of cataract, surgical options, and treatments and risks were discussed with patient. - discussed diagnosis and progression - monitor  3. Pseudophakia OS  - s/p CE/IOL OS  - IOL in good position, doing well  - monitor  Ophthalmic Meds Ordered this visit:  Meds ordered this encounter  Medications   Bevacizumab (AVASTIN) SOLN 1.25 mg     Return in about 8 weeks (around 06/30/2023) for f/u Ex. AMD OU, DFE, OCT.  There are no Patient Instructions on file for this visit.   Explained the diagnoses, plan, and follow up with the patient and they expressed understanding.  Patient expressed understanding of the importance of proper follow up care.   This document serves as a record of services personally performed by Karie Chimera, MD, PhD. It was created on their behalf by De Blanch, an ophthalmic technician. The creation of this record is the provider's dictation and/or activities during the visit.    Electronically signed by: De Blanch, OA, 05/05/23  11:46 AM  This document serves as a record of services personally performed by Karie Chimera, MD, PhD. It was created on their behalf by Charlette Caffey, COT an ophthalmic technician. The creation of this record is the provider's dictation and/or activities during the visit.    Electronically signed by:  Charlette Caffey, COT  05/05/23 11:46 AM  Karie Chimera, M.D., Ph.D. Diseases & Surgery of the Retina  and Vitreous [facility]  I have reviewed the above documentation for accuracy and completeness, and I agree with the above. Karie Chimera, M.D., Ph.D. 05/05/23 11:47 AM   Abbreviations: M myopia (nearsighted); A astigmatism; H hyperopia (farsighted); P presbyopia; Mrx spectacle prescription;  CTL contact lenses; OD right eye; OS left eye; OU both eyes  XT exotropia; ET esotropia; PEK punctate epithelial keratitis; PEE punctate epithelial erosions; DES dry eye syndrome; MGD meibomian gland dysfunction; ATs artificial tears; PFAT's preservative free artificial tears; NSC nuclear sclerotic cataract; PSC posterior subcapsular cataract; ERM epi-retinal membrane; PVD posterior vitreous detachment; RD retinal detachment; DM diabetes mellitus; DR diabetic retinopathy; NPDR non-proliferative diabetic retinopathy; PDR proliferative diabetic retinopathy; CSME clinically significant macular edema; DME diabetic macular edema; dbh dot blot hemorrhages; CWS cotton wool spot; POAG primary open angle glaucoma; C/D cup-to-disc ratio; HVF humphrey visual field; GVF goldmann visual field; OCT optical coherence tomography; IOP intraocular pressure; BRVO Branch retinal vein occlusion; CRVO central retinal vein occlusion; CRAO central retinal artery occlusion; BRAO branch retinal artery occlusion; RT retinal tear; SB scleral buckle; PPV pars plana vitrectomy; VH Vitreous hemorrhage; PRP panretinal laser photocoagulation; IVK intravitreal kenalog; VMT vitreomacular traction; MH Macular hole;  NVD neovascularization of the disc; NVE neovascularization elsewhere; AREDS age related eye disease study; ARMD age related macular degeneration; POAG primary open angle glaucoma; EBMD epithelial/anterior basement membrane dystrophy; ACIOL anterior chamber intraocular lens; IOL intraocular lens; PCIOL posterior chamber intraocular lens; Phaco/IOL phacoemulsification with intraocular lens placement; PRK photorefractive  keratectomy; LASIK laser assisted in situ keratomileusis; HTN hypertension; DM diabetes mellitus; COPD chronic obstructive pulmonary disease

## 2023-05-05 ENCOUNTER — Ambulatory Visit (INDEPENDENT_AMBULATORY_CARE_PROVIDER_SITE_OTHER): Payer: Medicare Other | Admitting: Ophthalmology

## 2023-05-05 ENCOUNTER — Encounter (INDEPENDENT_AMBULATORY_CARE_PROVIDER_SITE_OTHER): Payer: Self-pay | Admitting: Ophthalmology

## 2023-05-05 DIAGNOSIS — Z961 Presence of intraocular lens: Secondary | ICD-10-CM | POA: Diagnosis not present

## 2023-05-05 DIAGNOSIS — H25811 Combined forms of age-related cataract, right eye: Secondary | ICD-10-CM

## 2023-05-05 DIAGNOSIS — H353231 Exudative age-related macular degeneration, bilateral, with active choroidal neovascularization: Secondary | ICD-10-CM | POA: Diagnosis not present

## 2023-05-05 MED ORDER — BEVACIZUMAB CHEMO INJECTION 1.25MG/0.05ML SYRINGE FOR KALEIDOSCOPE
1.2500 mg | INTRAVITREAL | Status: AC | PRN
Start: 2023-05-05 — End: 2023-05-05
  Administered 2023-05-05: 1.25 mg via INTRAVITREAL

## 2023-05-05 NOTE — Telephone Encounter (Signed)
Only feedback patient would give is "wife was impressed with Dr. Francine Graven". Patient had an acute visit with Dr. Francine Graven 11/2022. Patient is scheduled as a new patient 07/21/2023 at 2pm with Dr. Francine Graven.

## 2023-05-20 ENCOUNTER — Telehealth: Payer: Self-pay | Admitting: Pulmonary Disease

## 2023-05-20 NOTE — Telephone Encounter (Signed)
Need DME Equipment

## 2023-05-26 NOTE — Telephone Encounter (Signed)
Spoke to patient.  He stated that he is needing high flow oxygen hose. He stated that he reached out to [employer] and order is in process.  He stated that nothing further is needed at this time.

## 2023-06-17 ENCOUNTER — Other Ambulatory Visit: Payer: Self-pay | Admitting: Cardiology

## 2023-06-24 NOTE — Progress Notes (Signed)
[facility] - Clinic Note  06/30/2023     CHIEF COMPLAINT Patient presents for Retina Follow Up   HISTORY OF PRESENT ILLNESS: Frank Meyer is a 82 y.o. 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  Severity is moderate.  Duration of 8 weeks.  Since onset it is stable.  I, the attending physician,  performed the HPI with the patient and updated documentation appropriately.        Comments   Patient states vision the same OU      Last edited by Rennis Chris, MD on 06/30/2023 11:41 AM.    Pt states vision is stable, no new health concerns  Referring physician: Geoffry Paradise, MD [address] [place],  [place] [address]  HISTORICAL INFORMATION:   Selected notes from the MEDICAL RECORD NUMBER [name] pt, transferring care due to insurance LEE: 09.12.23 -- IVA OU Ocular Hx- PMH-    CURRENT MEDICATIONS: No current outpatient medications on file. (Ophthalmic Drugs)   No current facility-administered medications for this visit. (Ophthalmic Drugs)   Current Outpatient Medications (Other)  Medication Sig   aspirin 81 MG EC tablet Take 81 mg by mouth daily.   atorvastatin (LIPITOR) 20 MG tablet Take 20 mg by mouth every evening.   cholecalciferol (VITAMIN D) 1000 UNITS tablet Take 1,000 Units by mouth daily.   donepezil (ARICEPT) 10 MG tablet Take 1 tablet (10 mg total) by mouth daily.   ENTRESTO 24-26 MG TAKE 1 TABLET BY MOUTH DAILY   fexofenadine (ALLEGRA) 180 MG tablet Take 180 mg by mouth daily.   finasteride (PROSCAR) 5 MG tablet Take 5 mg by mouth at bedtime.   furosemide (LASIX) 40 MG tablet Take 1 tablet (40 mg total) by mouth every other day. (Patient taking differently: Take 40 mg by mouth daily.)   ipratropium (ATROVENT) 0.06 % nasal spray Place 1-2 sprays into both nostrils See admin instructions. 2 sprays in the right nostril and 1 spray in the left nostril once  daily   ipratropium-albuterol (DUONEB) 0.5-2.5 (3) MG/3ML SOLN USE 1 VIAL VIA NEBULIZER TWICE  DAILY   Melatonin 10 MG TABS Take 20 mg by mouth at bedtime.   omeprazole (PRILOSEC) 40 MG capsule Take 40 mg by mouth daily.    OXYGEN Inhale 2-5 L into the lungs as directed. 5lt during the day and 2lt at night   thiamine 100 MG tablet Take 1 tablet (100 mg total) by mouth daily.   traZODone (DESYREL) 100 MG tablet TAKE 2 TABLETS BY MOUTH AT  BEDTIME   umeclidinium-vilanterol (ANORO ELLIPTA) 62.5-25 MCG/ACT AEPB Inhale 1 puff into the lungs daily.   levETIRAcetam (KEPPRA) 250 MG tablet Take 1-2 tablets (250-500 mg total) by mouth See admin instructions. Take 250 mg by mouth in the morning and 500 mg by mouth at night. (Patient taking differently: Take 250-500 mg by mouth See admin instructions. Take one tablet (250 mg) by mouth in the morning and two tablets (500 mg) by mouth at night.)   nitroGLYCERIN (NITROSTAT) 0.4 MG SL tablet Place 1 tablet (0.4 mg total) under the tongue every 5 (five) minutes as needed. Chest pain (Patient taking differently: Place 0.4 mg under the tongue every 5 (five) minutes as needed for chest pain. Chest pain)   No current facility-administered medications for this visit. (Other)   REVIEW OF SYSTEMS: ROS  Positive for: Cardiovascular, Eyes, Respiratory Negative for: Constitutional, Gastrointestinal, Neurological, Skin, Genitourinary, Musculoskeletal, HENT, Endocrine, Psychiatric, Allergic/Imm, Heme/Lymph Last edited by Annalee Genta D, COT on 06/30/2023  9:45 AM.     ALLERGIES No Known Allergies  PAST MEDICAL HISTORY Past Medical History:  Diagnosis Date   AKI (acute kidney injury) (HCC) 08/03/2022   Allergic rhinitis 10/24/2009   Altered bowel habits 10/04/2020   Basal cell carcinoma    Benign prostatic hyperplasia with lower urinary tract symptoms 10/24/2009   Benign prostatic hypertrophy    Bradycardia 12/06/2019   Callosity 10/16/2013   Cardiomegaly  01/23/2015   Chronic diastolic heart failure (HCC) 12/19/2018   Chronic respiratory failure (HCC) 10/04/2020   Coronary artery calcification seen on CAT scan 05/28/2020   Dyslipidemia    Dyspnea on exertion 03/18/2018   Elevated troponin 08/03/2022   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   Encounter for general adult medical examination without abnormal findings 01/17/2015   Epistaxis 09/18/2016   Ex-cigarette smoker 01/20/2021   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   Gallbladder problem    Gastroesophageal reflux disease    Hiatal hernia    Hilar adenopathy 03/25/2018   History of seizures 08/03/2022   History of smoking 03/18/2018   History of transient ischemic attack (TIA) 10/24/2009   Hyperlipidemia 10/24/2009   Insomnia 05/09/2013   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   Internal hemorrhoid 10/04/2020   Macular degeneration    Macular pucker, right eye 04/30/2020   Mediastinal adenopathy 03/25/2018   Memory loss 05/09/2013   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   Mild memory disturbance    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   Noninfective gastroenteritis and colitis, unspecified 10/04/2020   Nuclear sclerotic cataract of right eye 12/18/2019   Other specified symptoms and signs involving the digestive system and abdomen 10/04/2020   Overweight 09/14/2018   Oxygen dependent 10/04/2020   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   Personal history of colonic polyps 10/04/2020   Posterior vitreous detachment of right eye 12/18/2019   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   Pulmonary hypertension (HCC)    Right upper lobe pulmonary nodule 03/25/2018   Syncope and collapse 12/12/2018   TIA (transient ischemic attack)    Transient ischemic attack 10/24/2009   Vasomotor rhinitis 09/18/2016   Weight decreased 10/04/2020    Past Surgical History:  Procedure Laterality Date   CHOLECYSTECTOMY     none     FAMILY HISTORY Family History  Problem Relation Age of Onset   Obesity Mother    Memory loss Mother    SOCIAL HISTORY Social History   Tobacco Use   Smoking status: Former    Current packs/day: 0.00    Average packs/day: 2.0 packs/day for 25.0 years (50.0 ttl pk-yrs)    Types: Cigarettes    Start date: 32    Quit date: 1994    Years since quitting: 30.8   Smokeless tobacco: Never  Vaping Use   Vaping status: Never Used  Substance Use Topics   Alcohol use: Yes    Alcohol/week: 15.0 - 20.0 standard drinks of alcohol    Types: 15 - 20 Cans of beer per week   Drug use: Never       OPHTHALMIC EXAM:  Base Eye Exam     Visual Acuity (Snellen - Linear)       Right Left   Dist [place] 20/30 +1 20/30 -1  Dist ph [place] 20/25 NI         Tonometry (Tonopen, 9:52 AM)       Right Left   Pressure 10 11         Pupils       Dark Light Shape React APD   Right 3 2 Round Brisk None   Left 3 2 Round Brisk None         Visual Fields (Counting fingers)       Left Right    Full Full         Extraocular Movement       Right Left    Full, Ortho Full, Ortho         Neuro/Psych     Oriented x3: Yes   Mood/Affect: Normal           Slit Lamp and Fundus Exam     External Exam       Right Left   External Normal Normal         Slit Lamp Exam       Right Left   Lids/Lashes Dermatochalasis - upper lid, Meibomian gland dysfunction Dermatochalasis - upper lid, Meibomian gland dysfunction   Conjunctiva/Sclera White and quiet White and quiet   Cornea arcus, 1-2+ Punctate epithelial erosions arcus, 1-2+ Punctate epithelial erosions, well healed cataract wound   Anterior Chamber deep and clear deep and clear   Iris Round and dilated Round and dilated   Lens 2-3+ Nuclear sclerosis with brunescence, 2-3+ Cortical cataract PC IOL in good position   Anterior Vitreous Vitreous  syneresis Vitreous syneresis, silicone oil microbubbles         Fundus Exam       Right Left   Posterior Vitreous Posterior vitreous detachment Posterior vitreous detachment, vitreous condensations   Disc Pink and Sharp Pink and Sharp   C/D Ratio 0.2 0.2   Macula Flat, good foveal reflex, Drusen, RPE mottling and clumping, mild Atrophy temporal mac Flat, Blunted foveal reflex, RPE mottling, low PED/CNV with mild stable improvement in overlying edema, no frank heme   Vessels attenuated, mild tortuosity attenuated, Tortuous   Periphery Attached, mild reticular degeneration, No heme Attached, mild reticular degeneration, No heme, atrophic and pigmented CR scarring temporal periphery           IMAGING AND PROCEDURES  Imaging and Procedures for 06/30/2023  OCT, Retina - OU - Both Eyes       Right Eye Quality was borderline. Central Foveal Thickness: 311. Progression has been stable. Findings include normal foveal contour, no IRF, no SRF, retinal drusen , outer retinal atrophy (Focal ORA temporal macula, no fluid or edema, trace cystic changes vs schisis nasal mac -- persistent).   Left Eye Quality was borderline. Central Foveal Thickness: 315. Progression has been stable. Findings include no IRF, no SRF, abnormal foveal contour, retinal drusen , subretinal hyper-reflective material, pigment epithelial detachment (stable improvement in [facility] / edema overlying low PED nasal fovea/macula).   Notes *Images captured and stored on drive  Diagnosis / Impression:  OD: Focal ORA temporal macula; no fluid or edema; trace cystic changes vs schisis nasal mac -- persistent OS: stable improvement in [facility] / edema overlying low PED nasal fovea/macula  Clinical management:  See below  Abbreviations: NFP - Normal foveal profile. CME - cystoid macular edema. PED - pigment epithelial detachment. IRF - intraretinal fluid. SRF - subretinal fluid. EZ - ellipsoid zone. ERM - epiretinal membrane. ORA -  outer retinal atrophy. ORT -  outer retinal tubulation. SRHM - subretinal hyper-reflective material. IRHM - intraretinal hyper-reflective material      Intravitreal Injection, Pharmacologic Agent - OS - Left Eye       Time Out 06/30/2023. 11:09 AM. Confirmed correct patient, procedure, site, and patient consented.   Anesthesia Topical anesthesia was used. Anesthetic medications included Lidocaine 2%, Proparacaine 0.5%.   Procedure Preparation included 5% betadine to ocular surface, eyelid speculum. A (32g) needle was used.   Injection: 1.25 mg Bevacizumab 1.25mg /0.42ml   Route: Intravitreal, Site: Left Eye   NDC: [id number], Lot: [id number], Expiration date: 09/27/2023   Post-op Post injection exam found visual acuity of at least counting fingers. The patient tolerated the procedure well. There were no complications. The patient received written and verbal post procedure care education.            ASSESSMENT/PLAN:    ICD-10-CM   1. 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 Combined forms of age-related cataract of right eye  H25.811     3. Pseudophakia  Z96.1      Exudative age related macular degeneration, both eyes  - Dr. Luciana Axe pt here due to insurance reasons - history of multiple IVA OU on q6-8 scheduled (last IVA OU --11.15.23 - Dr. Luciana Axe)   - s/p IVA OS #1 (01.22.24), #2 (03.27.24), #3 (05.22.24), #4 (07.24.24), #5 (09.18.24) **history of increased SRHM/edema and decreased VA OS at 9 wks on 07.24.24**  - BCVA OU 20/30 - stable - OCT shows OD: Focal ORA temporal macula, no fluid or edema trace cystic changes vs schisis nasal mac; OS: stable improvement in [facility] / edema overlying low PED nasal fovea/macula at 8 weeks - recommend IVA OS #6 today, 11.13.24 with f/u  in 8 weeks again - recommend holding IVA OD again -- pt in agreement  - pt wishes proceed with injection OS  - RBA of procedure discussed, questions answered - informed consent obtained and signed 01.22.24 (OS) - see procedure note - Eylea approved for 2024  - f/u in 8 wks, sooner prn -- DFE/OCT, possible injection(s)  2. Mixed Cataract OD - The symptoms of cataract, surgical options, and treatments and risks were discussed with patient. - discussed diagnosis and progression - monitor  3. Pseudophakia OS  - s/p CE/IOL OS  - IOL in good position, doing well  - monitor  Ophthalmic Meds Ordered this visit:  Meds ordered this encounter  Medications   Bevacizumab (AVASTIN) SOLN 1.25 mg     Return in about 8 weeks (around 08/25/2023) for f/u exu ARMD OU, DFE, OCT.  There are no Patient Instructions on file for this visit.   Explained the diagnoses, plan, and follow up with the patient and they expressed understanding.  Patient expressed understanding of the importance of proper follow up care.   This document serves as a record of services personally performed by Karie Chimera, MD, PhD. It was created on their behalf by De Blanch, an ophthalmic technician. The creation of this record is the provider's dictation and/or activities during the visit.    Electronically signed by: De Blanch, OA, 06/30/23  11:49 AM  This document serves as a record of services personally performed by Karie Chimera, MD, PhD. It was created on their behalf by Glee Arvin.  Manson Passey, OA an ophthalmic technician. The creation of this record is the provider's dictation and/or activities during the visit.    Electronically signed by: Glee Arvin. Manson Passey, OA 06/30/23 11:49 AM  Karie Chimera, M.D., Ph.D. Diseases & Surgery of the Retina and Vitreous [facility]  I have reviewed the above documentation for accuracy and completeness, and I agree with the above. Karie Chimera,  M.D., Ph.D. 06/30/23 11:50 AM   Abbreviations: M myopia (nearsighted); A astigmatism; H hyperopia (farsighted); P presbyopia; Mrx spectacle prescription;  CTL contact lenses; OD right eye; OS left eye; OU both eyes  XT exotropia; ET esotropia; PEK punctate epithelial keratitis; PEE punctate epithelial erosions; DES dry eye syndrome; MGD meibomian gland dysfunction; ATs artificial tears; PFAT's preservative free artificial tears; NSC nuclear sclerotic cataract; PSC posterior subcapsular cataract; ERM epi-retinal membrane; PVD posterior vitreous detachment; RD retinal detachment; DM diabetes mellitus; DR diabetic retinopathy; NPDR non-proliferative diabetic retinopathy; PDR proliferative diabetic retinopathy; CSME clinically significant macular edema; DME diabetic macular edema; dbh dot blot hemorrhages; CWS cotton wool spot; POAG primary open angle glaucoma; C/D cup-to-disc ratio; HVF humphrey visual field; GVF goldmann visual field; OCT optical coherence tomography; IOP intraocular pressure; BRVO Branch retinal vein occlusion; CRVO central retinal vein occlusion; CRAO central retinal artery occlusion; BRAO branch retinal artery occlusion; RT retinal tear; SB scleral buckle; PPV pars plana vitrectomy; VH Vitreous hemorrhage; PRP panretinal laser photocoagulation; IVK intravitreal kenalog; VMT vitreomacular traction; MH Macular hole;  NVD neovascularization of the disc; NVE neovascularization elsewhere; AREDS age related eye disease study; ARMD age related macular degeneration; POAG primary open angle glaucoma; EBMD epithelial/anterior basement membrane dystrophy; ACIOL anterior chamber intraocular lens; IOL intraocular lens; PCIOL posterior chamber intraocular lens; Phaco/IOL phacoemulsification with intraocular lens placement; PRK photorefractive keratectomy; LASIK laser assisted in situ keratomileusis; HTN hypertension; DM diabetes mellitus; COPD chronic obstructive pulmonary disease

## 2023-06-30 ENCOUNTER — Encounter (INDEPENDENT_AMBULATORY_CARE_PROVIDER_SITE_OTHER): Payer: Self-pay | Admitting: Ophthalmology

## 2023-06-30 ENCOUNTER — Ambulatory Visit (INDEPENDENT_AMBULATORY_CARE_PROVIDER_SITE_OTHER): Payer: Medicare Other | Admitting: Ophthalmology

## 2023-06-30 DIAGNOSIS — Z961 Presence of intraocular lens: Secondary | ICD-10-CM

## 2023-06-30 DIAGNOSIS — H25811 Combined forms of age-related cataract, right eye: Secondary | ICD-10-CM | POA: Diagnosis not present

## 2023-06-30 DIAGNOSIS — H353231 Exudative age-related macular degeneration, bilateral, with active choroidal neovascularization: Secondary | ICD-10-CM

## 2023-06-30 MED ORDER — BEVACIZUMAB CHEMO INJECTION 1.25MG/0.05ML SYRINGE FOR KALEIDOSCOPE
1.2500 mg | INTRAVITREAL | Status: AC | PRN
Start: 2023-06-30 — End: 2023-06-30
  Administered 2023-06-30: 1.25 mg via INTRAVITREAL

## 2023-07-01 ENCOUNTER — Other Ambulatory Visit: Payer: Self-pay | Admitting: Neurology

## 2023-07-01 NOTE — Telephone Encounter (Signed)
Rx refilled per last office visit note, "At last visit we have started him on Keppra 250 mg in the morning and 500 at night.", "Continue with Keppra 250/500 mg."

## 2023-07-07 ENCOUNTER — Other Ambulatory Visit: Payer: Self-pay | Admitting: Neurology

## 2023-07-21 ENCOUNTER — Ambulatory Visit: Payer: Medicare Other | Admitting: Pulmonary Disease

## 2023-07-21 ENCOUNTER — Encounter: Payer: Self-pay | Admitting: Pulmonary Disease

## 2023-07-21 VITALS — BP 98/58 | HR 79 | Resp 18 | Ht 66.0 in | Wt 173.6 lb

## 2023-07-21 DIAGNOSIS — J9611 Chronic respiratory failure with hypoxia: Secondary | ICD-10-CM | POA: Diagnosis not present

## 2023-07-21 DIAGNOSIS — J439 Emphysema, unspecified: Secondary | ICD-10-CM

## 2023-07-21 DIAGNOSIS — J9612 Chronic respiratory failure with hypercapnia: Secondary | ICD-10-CM | POA: Diagnosis not present

## 2023-07-21 DIAGNOSIS — I272 Pulmonary hypertension, unspecified: Secondary | ICD-10-CM | POA: Diagnosis not present

## 2023-07-21 NOTE — Progress Notes (Signed)
Synopsis: Referred in April 2024 for acute visit, patient of Dr. Judeth Horn with emphysema  Subjective:   PATIENT ID: Frank Meyer GENDER: male DOB: 05/20/1941, MRN: [id number]  HPI  Chief Complaint  Patient presents with   Follow-up    New to you. Breathing is fine. Maintaining 88% on O2 is easy while sitting but walking it drops quickly, comes up quickly with rest. O2 drops to 70s on 6 L while walking.    Frank Meyer is an 82 year old male, former smoker with COPD/Emphysema, chronic hypoxemic respiratory failure, pulmonary hypertension, and diastolic heart failure who returns to pulmonary clinic for follow up.   Initial visit 11/18/22 He was treated for COPD exacerbation 10/12/22 by his primary care team. He was admitted 1/26 to 1/31 for acute on chronic respiratory failure due to heart failure exacerbation and COPD exacerbation along with multifocal pneumonia. He completed his course of antibiotics and steroid taper.  He went to 4 pulmonary rehab sessions without issues. Yesterday, at pulmonary rehab he had significant increase in his O2 needs and was placed on non-rebreather mask.   He is overall feeling ok. He denies fevers, chills or sweats. Denies increased mucous production. Denies increased edema. He has a blood pressure and daily weight log. His weights have been stable between 166-169lbs and his blood pressure has been stable.    He is alert and oriented time 4. No confusion noted.  Patient walked in hallway on 6L oxygen with oxygen saturations above 92% on first lap (230ft). 50 ft into second lap, patient's oxygen saturations dropped to 79%. He remained asymptomatic. Pleth was strong on monitor. He recovered above 88% within a minute of rest.   Today 07/21/23 The patient, with a history of significant lung disease including pulmonary hypertension and COPD, presents for a routine follow-up. He reports managing his condition with Anoro, a nebulizer treatment twice daily,  and an albuterol inhaler as needed. Despite requiring high levels of supplemental oxygen, the patient remains active and exercises regularly, even going to the gym. He acknowledges that his oxygen levels can drop into the seventies during activity but recover upon rest. The patient also reports memory issues and a family history of dementia or Alzheimer's, which is a concern for him. He is currently under palliative care and has a DNR order in place. The patient expresses understanding of his condition and appears prepared for potential future acute illnesses.   Past Medical History:  Diagnosis Date   AKI (acute kidney injury) (HCC) 08/03/2022   Allergic rhinitis 10/24/2009   Altered bowel habits 10/04/2020   Basal cell carcinoma    Benign prostatic hyperplasia with lower urinary tract symptoms 10/24/2009   Benign prostatic hypertrophy    Bradycardia 12/06/2019   Callosity 10/16/2013   Cardiomegaly 01/23/2015   Chronic diastolic heart failure (HCC) 12/19/2018   Chronic respiratory failure (HCC) 10/04/2020   Coronary artery calcification seen on CAT scan 05/28/2020   Dyslipidemia    Dyspnea on exertion 03/18/2018   Elevated troponin 08/03/2022   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   Encounter for general adult medical examination without abnormal findings 01/17/2015   Epistaxis 09/18/2016   Ex-cigarette smoker 01/20/2021   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   Gallbladder problem    Gastroesophageal reflux disease    Hiatal hernia    Hilar adenopathy 03/25/2018   History of seizures 08/03/2022   History of smoking 03/18/2018   History of transient ischemic attack (  TIA) 10/24/2009   Hyperlipidemia 10/24/2009   Insomnia 05/09/2013   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   Internal hemorrhoid 10/04/2020   Macular degeneration    Macular pucker, right eye  04/30/2020   Mediastinal adenopathy 03/25/2018   Memory loss 05/09/2013   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   Mild memory disturbance    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   Noninfective gastroenteritis and colitis, unspecified 10/04/2020   Nuclear sclerotic cataract of right eye 12/18/2019   Other specified symptoms and signs involving the digestive system and abdomen 10/04/2020   Overweight 09/14/2018   Oxygen dependent 10/04/2020   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   Personal history of colonic polyps 10/04/2020   Posterior vitreous detachment of right eye 12/18/2019   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   Pulmonary hypertension (HCC)    Right upper lobe pulmonary nodule 03/25/2018   Syncope and collapse 12/12/2018   TIA (transient ischemic attack)    Transient ischemic attack 10/24/2009   Vasomotor rhinitis 09/18/2016   Weight decreased 10/04/2020     Family History  Problem Relation Age of Onset   Obesity Mother    Memory loss Mother      Social History   Socioeconomic History   Marital status: Married    Spouse name: pamela   Number of children: 2   Years of education: college   Highest education level: Not on file  Occupational History   Occupation: retired  Tobacco Use   Smoking status: Former    Current packs/day: 0.00    Average packs/day: 2.0 packs/day for 25.0 years (50.0 ttl pk-yrs)    Types: Cigarettes    Start date: 1969    Quit date: 1994    Years since quitting: 30.9   Smokeless tobacco: Never  Vaping Use   Vaping status: Never Used  Substance and Sexual Activity   Alcohol use: Yes    Alcohol/week: 15.0 - 20.0 standard drinks of alcohol    Types: 15 - 20 Cans of beer per week   Drug use: Never   Sexual activity: Not Currently  Other Topics Concern   Not on file  Social History Narrative   Pt is retired. He is married. The pt lives with his wife. He has a Chiropodist. The pt has 2 children.       Patient drinks 2-3 cups of caffeine daily.   Patient is right handed.    Social Determinants of Health   Financial Resource Strain: Not on file  Food Insecurity: No Food Insecurity (10/28/2022)   Hunger Vital Sign    Worried About Running Out of Food in the Last Year: Never true    Ran Out of Food in the Last Year: Never true  Transportation Needs: No Transportation Needs (10/28/2022)   PRAPARE - Administrator, Civil Service (Medical): No    Lack of Transportation (Non-Medical): No  Physical Activity: Not on file  Stress: Not on file  Social Connections: Not on file  Intimate Partner Violence: Not At Risk (10/28/2022)   Humiliation, Afraid, Rape, and Kick questionnaire    Fear of Current or Ex-Partner: No    Emotionally Abused: No    Physically Abused: No    Sexually Abused: No     No Known Allergies   Outpatient Medications Prior to Visit  Medication Sig Dispense Refill   albuterol (VENTOLIN HFA) 108 (90 Base) MCG/ACT inhaler Inhale 1-2 puffs into the lungs every  4 (four) hours as needed for wheezing or shortness of breath.     aspirin 81 MG EC tablet Take 81 mg by mouth daily.     atorvastatin (LIPITOR) 20 MG tablet Take 20 mg by mouth every evening.     cholecalciferol (VITAMIN D) 1000 UNITS tablet Take 1,000 Units by mouth daily.     donepezil (ARICEPT) 10 MG tablet Take 1 tablet (10 mg total) by mouth daily. 90 tablet 3   ENTRESTO 24-26 MG TAKE 1 TABLET BY MOUTH DAILY 100 tablet 2   finasteride (PROSCAR) 5 MG tablet Take 5 mg by mouth at bedtime.     furosemide (LASIX) 40 MG tablet Take 1 tablet (40 mg total) by mouth every other day. (Patient taking differently: Take 40 mg by mouth daily.) 30 tablet 0   ipratropium (ATROVENT) 0.06 % nasal spray Place 1-2 sprays into both nostrils See admin instructions. 2 sprays in the right nostril and 1 spray in the left nostril once daily     ipratropium-albuterol (DUONEB) 0.5-2.5 (3) MG/3ML SOLN USE 1 VIAL VIA NEBULIZER  TWICE  DAILY 720 mL 2   levETIRAcetam (KEPPRA) 250 MG tablet TAKE 1 TABLET BY MOUTH IN THE  MORNING AND 2 TABLETS AT NIGHT 300 tablet 0   Melatonin 10 MG TABS Take 20 mg by mouth at bedtime.     omeprazole (PRILOSEC) 40 MG capsule Take 40 mg by mouth daily.      OXYGEN Inhale 2-5 L into the lungs as directed. 5lt during the day and 2lt at night     thiamine 100 MG tablet Take 1 tablet (100 mg total) by mouth daily. 30 tablet 0   traZODone (DESYREL) 100 MG tablet TAKE 2 TABLETS BY MOUTH AT  BEDTIME 60 tablet 0   umeclidinium-vilanterol (ANORO ELLIPTA) 62.5-25 MCG/ACT AEPB Inhale 1 puff into the lungs daily. 60 each 11   fexofenadine (ALLEGRA) 180 MG tablet Take 180 mg by mouth daily. (Patient not taking: Reported on 07/21/2023)     nitroGLYCERIN (NITROSTAT) 0.4 MG SL tablet Place 1 tablet (0.4 mg total) under the tongue every 5 (five) minutes as needed. Chest pain (Patient taking differently: Place 0.4 mg under the tongue every 5 (five) minutes as needed for chest pain. Chest pain) 25 tablet 6   No facility-administered medications prior to visit.   Review of Systems  Constitutional:  Negative for chills, fever, malaise/fatigue and weight loss.  HENT:  Negative for congestion, sinus pain and sore throat.   Eyes: Negative.   Respiratory:  Positive for cough and shortness of breath. Negative for hemoptysis, sputum production and wheezing.   Cardiovascular:  Negative for chest pain, palpitations, orthopnea, claudication and leg swelling.  Gastrointestinal:  Negative for abdominal pain, heartburn, nausea and vomiting.  Genitourinary: Negative.   Musculoskeletal:  Negative for joint pain and myalgias.  Skin:  Negative for rash.  Neurological:  Negative for weakness.  Endo/Heme/Allergies: Negative.   Psychiatric/Behavioral: Negative.     Objective:   Vitals:   07/21/23 1342  BP: (!) 98/58  Pulse: 79  Resp: 18  SpO2: 97%  Weight: 173 lb 9.6 oz (78.7 kg)  Height: 5\' 6"  (1.676 m)     Physical Exam Constitutional:      General: He is not in acute distress. HENT:     Head: Normocephalic and atraumatic.  Eyes:     Conjunctiva/sclera: Conjunctivae normal.  Cardiovascular:     Rate and Rhythm: Normal rate and regular rhythm.  Pulses: Normal pulses.     Heart sounds: Normal heart sounds. No murmur heard. Pulmonary:     Effort: Pulmonary effort is normal.     Breath sounds: Rales (mild scattered) present. No wheezing.  Musculoskeletal:     Right lower leg: No edema.     Left lower leg: No edema.  Skin:    General: Skin is warm and dry.  Neurological:     General: No focal deficit present.     Mental Status: He is alert.    CBC    Component Value Date/Time   WBC 11.6 (H) 10/30/2022 0018   RBC 4.02 (L) 10/30/2022 0018   HGB 10.9 (L) 10/30/2022 0018   HCT 33.8 (L) 10/30/2022 0018   PLT 237 10/30/2022 0018   MCV 84.1 10/30/2022 0018   MCH 27.1 10/30/2022 0018   MCHC 32.2 10/30/2022 0018   RDW 14.4 10/30/2022 0018   LYMPHSABS 3.7 10/27/2022 1040   MONOABS 1.1 (H) 10/27/2022 1040   EOSABS 0.8 (H) 10/27/2022 1040   BASOSABS 0.2 (H) 10/27/2022 1040      Latest Ref Rng & Units 10/30/2022   12:18 AM 10/29/2022   12:39 AM 10/28/2022    4:28 AM  BMP  Glucose 70 - 99 mg/dL 269  485  462   BUN 8 - 23 mg/dL 22  21  18    Creatinine 0.61 - 1.24 mg/dL 7.03  5.00  9.38   Sodium 135 - 145 mmol/L 137  140  137   Potassium 3.5 - 5.1 mmol/L 4.1  4.1  4.4   Chloride 98 - 111 mmol/L 104  107  103   CO2 22 - 32 mmol/L 28  28  25    Calcium 8.9 - 10.3 mg/dL 8.3  8.1  8.8    Chest imaging: CTA Chest 10/27/22 No evidence of pulmonary embolism.   Severe pulmonary emphysema. Increased diffuse ground-glass and new consolidative airspace disease in the lung bases and peripheral posterior aspect of the right upper lobe, concerning for pneumonia. Trace pleural effusions and trace pericardial effusion. Recommend follow-up chest CT in 3 months.   Unchanged right apical  pleuroparenchymal scarring with waxing and waning nodular features dating back to at least June 2020.   Multiple prominent mediastinal lymph nodes, favored to be reactive. Recommend attention on follow-up.  PFT:    Latest Ref Rng & Units 06/26/2020    3:50 PM 04/27/2018    8:48 AM  PFT Results  FVC-Pre L 3.75  4.11   FVC-Predicted Pre % 111  119   FVC-Post L 3.85  4.07   FVC-Predicted Post % 113  118   Pre FEV1/FVC % % 78  79   Post FEV1/FCV % % 78  78   FEV1-Pre L 2.92  3.23   FEV1-Predicted Pre % 122  131   FEV1-Post L 3.02  3.20   DLCO uncorrected ml/min/mmHg 9.44  11.81   DLCO UNC% % 44  44   DLCO corrected ml/min/mmHg 9.61  11.92   DLCO COR %Predicted % 44  44   DLVA Predicted % 45  49   TLC L 5.75  5.24   TLC % Predicted % 92  84   RV % Predicted % 87  51   PFTs 2021 severe diffusion defect  Labs:  Path:  Echo 08/03/22: LV EF 55-60%. Grade I diastolic dysfunction. RV size is mildly enlarged and function is normal. LA is mild to moderately dilated.   Heart Catheterization:  Assessment &  Plan:   Chronic respiratory failure with hypoxia and hypercapnia (HCC)  Pulmonary emphysema, unspecified emphysema type (HCC)  Pulmonary hypertension (HCC)  Discussion: Frank Meyer is an 82 year old male, former smoker with COPD/Emphysema, chronic hypoxemic respiratory failure, pulmonary hypertension, and diastolic heart failure who returns to pulmonary clinic for follow up.  End-stage COPD Pulmonary Hypertension Patient is on 6 liters of oxygen at rest and increases to 12-15 liters during exercise. Despite severe disease, patient is able to perform aerobic activities and maintains a good quality of life. Patient is an outlier in terms of physical activity and determination to maintain health. -Continue Anoro one puff daily. -Continue DuoNeb nebulizer solution twice daily. -Recently added Albuterol inhaler as needed. -Encourage patient to continue physical activity as  tolerated, ensuring safety measures such as having a chair available for rest and maintaining a slow, steady pace during activities. -Advise patient to monitor for signs of CO2 retention such as sleepiness, tiredness, or confusion when using high flow oxygen. -Continue monitoring with palliative care team.  Risk of Acute Illness Given the severity of the patient's lung disease, any acute illness such as pneumonia or other infections could lead to significant decline in health and potential hospitalization or death. -Advise patient to seek immediate medical attention if signs of infection or acute illness develop. -Continue discussions about advanced care planning and end-of-life preferences.  Follow-up in 3 months or sooner if any issues arise.  Melody Comas, MD [facility] Office: [phone]    Current Outpatient Medications:    albuterol (VENTOLIN HFA) 108 (90 Base) MCG/ACT inhaler, Inhale 1-2 puffs into the lungs every 4 (four) hours as needed for wheezing or shortness of breath., Disp: , Rfl:    aspirin 81 MG EC tablet, Take 81 mg by mouth daily., Disp: , Rfl:    atorvastatin (LIPITOR) 20 MG tablet, Take 20 mg by mouth every evening., Disp: , Rfl:    cholecalciferol (VITAMIN D) 1000 UNITS tablet, Take 1,000 Units by mouth daily., Disp: , Rfl:    donepezil (ARICEPT) 10 MG tablet, Take 1 tablet (10 mg total) by mouth daily., Disp: 90 tablet, Rfl: 3   ENTRESTO 24-26 MG, TAKE 1 TABLET BY MOUTH DAILY, Disp: 100 tablet, Rfl: 2   finasteride (PROSCAR) 5 MG tablet, Take 5 mg by mouth at bedtime., Disp: , Rfl:    furosemide (LASIX) 40 MG tablet, Take 1 tablet (40 mg total) by mouth every other day. (Patient taking differently: Take 40 mg by mouth daily.), Disp: 30 tablet, Rfl: 0   ipratropium (ATROVENT) 0.06 % nasal spray, Place 1-2 sprays into both nostrils See admin instructions. 2 sprays in the right nostril and 1 spray in the left nostril once daily, Disp: , Rfl:     ipratropium-albuterol (DUONEB) 0.5-2.5 (3) MG/3ML SOLN, USE 1 VIAL VIA NEBULIZER TWICE  DAILY, Disp: 720 mL, Rfl: 2   levETIRAcetam (KEPPRA) 250 MG tablet, TAKE 1 TABLET BY MOUTH IN THE  MORNING AND 2 TABLETS AT NIGHT, Disp: 300 tablet, Rfl: 0   Melatonin 10 MG TABS, Take 20 mg by mouth at bedtime., Disp: , Rfl:    omeprazole (PRILOSEC) 40 MG capsule, Take 40 mg by mouth daily. , Disp: , Rfl:    OXYGEN, Inhale 2-5 L into the lungs as directed. 5lt during the day and 2lt at night, Disp: , Rfl:    thiamine 100 MG tablet, Take 1 tablet (100 mg total) by mouth daily., Disp: 30 tablet, Rfl: 0  traZODone (DESYREL) 100 MG tablet, TAKE 2 TABLETS BY MOUTH AT  BEDTIME, Disp: 60 tablet, Rfl: 0   umeclidinium-vilanterol (ANORO ELLIPTA) 62.5-25 MCG/ACT AEPB, Inhale 1 puff into the lungs daily., Disp: 60 each, Rfl: 11   fexofenadine (ALLEGRA) 180 MG tablet, Take 180 mg by mouth daily. (Patient not taking: Reported on 07/21/2023), Disp: , Rfl:    nitroGLYCERIN (NITROSTAT) 0.4 MG SL tablet, Place 1 tablet (0.4 mg total) under the tongue every 5 (five) minutes as needed. Chest pain (Patient taking differently: Place 0.4 mg under the tongue every 5 (five) minutes as needed for chest pain. Chest pain), Disp: 25 tablet, Rfl: 6

## 2023-07-21 NOTE — Patient Instructions (Addendum)
Continue supplemental oxygen as you are with increasing it with activity and lowering it at rest/sleep  Continue anoro 1 puff daily  Use albuterol inhaler 1-2 puffs every 4-6 hours as needed  Continue to work out and stay active as you are  Follow up in 3 months, call sooner if needed

## 2023-07-22 ENCOUNTER — Ambulatory Visit: Payer: Medicare Other | Admitting: Neurology

## 2023-07-22 ENCOUNTER — Encounter: Payer: Self-pay | Admitting: Neurology

## 2023-07-22 VITALS — BP 98/60 | HR 94 | Ht 66.0 in

## 2023-07-22 DIAGNOSIS — G40909 Epilepsy, unspecified, not intractable, without status epilepticus: Secondary | ICD-10-CM

## 2023-07-22 DIAGNOSIS — F09 Unspecified mental disorder due to known physiological condition: Secondary | ICD-10-CM | POA: Diagnosis not present

## 2023-07-22 NOTE — Progress Notes (Signed)
[facility]  PATIENT: Frank Meyer DOB: [birth year]  REQUESTING CLINICIAN: Geoffry Paradise, MD HISTORY FROM: Patient and spouse  REASON FOR VISIT: Seizure like activity    HISTORICAL  CHIEF COMPLAINT:  Chief Complaint  Patient presents with   Seizures    Rm13, alone, ZO:XWRU one 2 years ago, moca: 24   INTERVAL HISTORY 07/22/2023 Patient presents today for follow-up, last visit was a year ago, since then he has been doing well, no seizure or seizure like activity.  He is compliant with the Keppra 250 in the morning and 500 at night no side effect from the medication.  He reports that his memory is stable, even though sometime he is forgetful.  He is trying to delegate more financial activities and role to his wife due to his ongoing pulmonary disease.  He is still on additional O2 supplement.  He continues to be very active, going to the gym 3 days a week and still driving, has not been lost in family or places.   INTERVAL HISTORY 07/30/2022:  Patient presents today for follow-up, he is alone.  At last visit we have started him on Keppra 250 mg in the morning and 500 at night.  Since starting the Keppra he has not had any additional episodes concerning for seizures.  Overall he is doing well.  His main complaint is his pulmonary status.  He was admitted to the hospital recently, found to have an infection in the lung.  He is pending follow-up with his pulmonologist scheduled in January.  He reports the other day, he tried to play 9 holes golf but could not complete it due to being out of breath.  Again no seizures and no side effect from the Keppra.   HISTORY OF PRESENT ILLNESS:  This is a 82 year old gentleman with past medical history of pulmonary hypertension, requiring 24/7 supplemental oxygen, memory decline, who is presenting with wife with 3 episodes of seizure-like activity during sleep for the past 2 years.  Wife reports patient has been having  activities described as shaking of the upper extremities in the middle of night during sleep, initially she thought he was dreaming but noted that it was very difficult to wake him up during the events.  Afterwards she reports that patient will feel tired and complains of jaw soreness.  Wife reports last episode was 2 weeks ago and it was most severe as patient had shaking of the upper extremities, ?generalized convulsion, with difficulty to wake him up and associated with urinary incontinence.  After the shaking stopped, he was unresponsive for another 10 minutes.  Afterward he was able to take a shower and go to sleep in the guest room.  The next day patient attempted to play golf but he has to come sooner because he was extremely tired.  He denies any previous history of seizures, previously has was followed by Dr. Anne Hahn for cognitive impairment/dementia.  He did not present to the emergency department after each episode but presented to the ED on 3/23 after a syncope while playing golf. Work up at that time was unrevealing  OTHER MEDICAL CONDITIONS: Pulmonary hypertension requiring supplemental O2,  Memory problem, Heart failure, TIA    REVIEW OF SYSTEMS: Full 14 system review of systems performed and negative with exception of: as noted in the HPI   ALLERGIES: No Known Allergies  HOME MEDICATIONS: Outpatient Medications Prior to Visit  Medication Sig Dispense Refill   albuterol (VENTOLIN HFA) 108 (90 Base) MCG/ACT  inhaler Inhale 1-2 puffs into the lungs every 4 (four) hours as needed for wheezing or shortness of breath.     aspirin 81 MG EC tablet Take 81 mg by mouth daily.     atorvastatin (LIPITOR) 20 MG tablet Take 20 mg by mouth every evening.     cholecalciferol (VITAMIN D) 1000 UNITS tablet Take 1,000 Units by mouth daily.     donepezil (ARICEPT) 10 MG tablet Take 1 tablet (10 mg total) by mouth daily. 90 tablet 3   ENTRESTO 24-26 MG TAKE 1 TABLET BY MOUTH DAILY 100 tablet 2    fexofenadine (ALLEGRA) 180 MG tablet Take 180 mg by mouth daily.     finasteride (PROSCAR) 5 MG tablet Take 5 mg by mouth at bedtime.     furosemide (LASIX) 40 MG tablet Take 1 tablet (40 mg total) by mouth every other day. (Patient taking differently: Take 40 mg by mouth daily.) 30 tablet 0   ipratropium (ATROVENT) 0.06 % nasal spray Place 1-2 sprays into both nostrils See admin instructions. 2 sprays in the right nostril and 1 spray in the left nostril once daily     ipratropium-albuterol (DUONEB) 0.5-2.5 (3) MG/3ML SOLN USE 1 VIAL VIA NEBULIZER TWICE  DAILY 720 mL 2   levETIRAcetam (KEPPRA) 250 MG tablet TAKE 1 TABLET BY MOUTH IN THE  MORNING AND 2 TABLETS AT NIGHT 300 tablet 0   Melatonin 10 MG TABS Take 20 mg by mouth at bedtime.     omeprazole (PRILOSEC) 40 MG capsule Take 40 mg by mouth daily.      OXYGEN Inhale 2-5 L into the lungs as directed. 5lt during the day and 2lt at night     thiamine 100 MG tablet Take 1 tablet (100 mg total) by mouth daily. 30 tablet 0   traZODone (DESYREL) 100 MG tablet TAKE 2 TABLETS BY MOUTH AT  BEDTIME 60 tablet 0   umeclidinium-vilanterol (ANORO ELLIPTA) 62.5-25 MCG/ACT AEPB Inhale 1 puff into the lungs daily. 60 each 11   nitroGLYCERIN (NITROSTAT) 0.4 MG SL tablet Place 1 tablet (0.4 mg total) under the tongue every 5 (five) minutes as needed. Chest pain (Patient taking differently: Place 0.4 mg under the tongue every 5 (five) minutes as needed for chest pain. Chest pain) 25 tablet 6   No facility-administered medications prior to visit.    PAST MEDICAL HISTORY: Past Medical History:  Diagnosis Date   AKI (acute kidney injury) (HCC) 08/03/2022   Allergic rhinitis 10/24/2009   Altered bowel habits 10/04/2020   Basal cell carcinoma    Benign prostatic hyperplasia with lower urinary tract symptoms 10/24/2009   Benign prostatic hypertrophy    Bradycardia 12/06/2019   Callosity 10/16/2013   Cardiomegaly 01/23/2015   Chronic diastolic heart failure  (HCC) 16/05/9603   Chronic respiratory failure (HCC) 10/04/2020   Coronary artery calcification seen on CAT scan 05/28/2020   Dyslipidemia    Dyspnea on exertion 03/18/2018   Elevated troponin 08/03/2022   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   Encounter for general adult medical examination without abnormal findings 01/17/2015   Epistaxis 09/18/2016   Ex-cigarette smoker 01/20/2021   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   Gallbladder problem    Gastroesophageal reflux disease    Hiatal hernia    Hilar adenopathy 03/25/2018   History of seizures 08/03/2022   History of smoking 03/18/2018   History of transient ischemic attack (TIA) 10/24/2009   Hyperlipidemia 10/24/2009   Insomnia  05/09/2013   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   Internal hemorrhoid 10/04/2020   Macular degeneration    Macular pucker, right eye 04/30/2020   Mediastinal adenopathy 03/25/2018   Memory loss 05/09/2013   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   Mild memory disturbance    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   Noninfective gastroenteritis and colitis, unspecified 10/04/2020   Nuclear sclerotic cataract of right eye 12/18/2019   Other specified symptoms and signs involving the digestive system and abdomen 10/04/2020   Overweight 09/14/2018   Oxygen dependent 10/04/2020   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   Personal history of colonic polyps 10/04/2020   Posterior vitreous detachment of right eye 12/18/2019   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   Pulmonary hypertension (HCC)    Right upper lobe pulmonary nodule 03/25/2018   Syncope and collapse 12/12/2018   TIA (transient ischemic attack)    Transient ischemic attack 10/24/2009   Vasomotor rhinitis 09/18/2016   Weight decreased 10/04/2020    PAST SURGICAL HISTORY: Past Surgical  History:  Procedure Laterality Date   CHOLECYSTECTOMY     none      FAMILY HISTORY: Family History  Problem Relation Age of Onset   Obesity Mother    Memory loss Mother     SOCIAL HISTORY: Social History   Socioeconomic History   Marital status: Married    Spouse name: pamela   Number of children: 2   Years of education: college   Highest education level: Not on file  Occupational History   Occupation: retired  Tobacco Use   Smoking status: Former    Current packs/day: 0.00    Average packs/day: 2.0 packs/day for 25.0 years (50.0 ttl pk-yrs)    Types: Cigarettes    Start date: 1969    Quit date: 1994    Years since quitting: 30.9   Smokeless tobacco: Never  Vaping Use   Vaping status: Never Used  Substance and Sexual Activity   Alcohol use: Yes    Alcohol/week: 15.0 - 20.0 standard drinks of alcohol    Types: 15 - 20 Cans of beer per week   Drug use: Never   Sexual activity: Not Currently  Other Topics Concern   Not on file  Social History Narrative   Pt is retired. He is married. The pt lives with his wife. He has a Chiropodist. The pt has 2 children.      Patient drinks 2-3 cups of caffeine daily.   Patient is right handed.    Social Determinants of Health   Financial Resource Strain: Not on file  Food Insecurity: No Food Insecurity (10/28/2022)   Hunger Vital Sign    Worried About Running Out of Food in the Last Year: Never true    Ran Out of Food in the Last Year: Never true  Transportation Needs: No Transportation Needs (10/28/2022)   PRAPARE - Administrator, Civil Service (Medical): No    Lack of Transportation (Non-Medical): No  Physical Activity: Not on file  Stress: Not on file  Social Connections: Not on file  Intimate Partner Violence: Not At Risk (10/28/2022)   Humiliation, Afraid, Rape, and Kick questionnaire    Fear of Current or Ex-Partner: No    Emotionally Abused: No    Physically Abused: No    Sexually Abused: No     PHYSICAL EXAM  GENERAL EXAM/CONSTITUTIONAL: Vitals:  Vitals:   07/22/23 1114  BP: 98/60  Pulse:  94  SpO2: 97%  Height: 5\' 6"  (1.676 m)   Body mass index is 28.02 kg/m. Wt Readings from Last 3 Encounters:  07/21/23 173 lb 9.6 oz (78.7 kg)  01/15/23 173 lb 3.2 oz (78.6 kg)  01/12/23 169 lb 15.6 oz (77.1 kg)   Patient is in no distress; well developed, nourished and groomed; neck is supple. Supplemental Oxygen  MUSCULOSKELETAL: Gait, strength, tone, movements noted in Neurologic exam below  NEUROLOGIC: MENTAL STATUS:     07/30/2022   11:15 AM 02/03/2022   10:31 AM 05/29/2021    9:57 AM  MMSE - Mini Mental State Exam  Orientation to time 5 4 5   Orientation to Place 5 5 5   Registration 3 3 3   Attention/ Calculation 5 5 5   Recall 3 2 2   Language- name 2 objects 2 2 2   Language- repeat 1 1 1   Language- follow 3 step command 3 3 3   Language- read & follow direction 1 1 1   Write a sentence 1 1 1   Copy design 1 1 1   Total score 30 28 29         07/22/2023   11:44 AM 07/22/2023   11:15 AM  Montreal Cognitive Assessment   Visuospatial/ Executive (0/5) 4 4  Naming (0/3) 2 2  Attention: Read list of digits (0/2) 2 2  Attention: Read list of letters (0/1) 1 1  Attention: Serial 7 subtraction starting at 100 (0/3) 3 3  Language: Repeat phrase (0/2) 2 2  Language : Fluency (0/1) 1 1  Abstraction (0/2) 2 2  Delayed Recall (0/5) 1 1  Orientation (0/6) 6 6  Total 24 24    awake, alert, oriented to person, place and time recent and remote memory intact normal attention and concentration language fluent, comprehension intact, naming intact fund of knowledge appropriate  CRANIAL NERVE:  2nd, 3rd, 4th, 6th - visual fields full to confrontation, extraocular muscles intact, no nystagmus 5th - facial sensation symmetric 7th - facial strength symmetric 8th - hearing intact 9th - palate elevates symmetrically, uvula midline 11th - shoulder shrug symmetric 12th -  tongue protrusion midline  MOTOR:  normal bulk and tone, full strength in the BUE, BLE  SENSORY:  normal and symmetric to light touch  GAIT/STATION:  normal   DIAGNOSTIC DATA (LABS, IMAGING, TESTING) - I reviewed patient records, labs, notes, testing and imaging myself where available.  Lab Results  Component Value Date   WBC 11.6 (H) 10/30/2022   HGB 10.9 (L) 10/30/2022   HCT 33.8 (L) 10/30/2022   MCV 84.1 10/30/2022   PLT 237 10/30/2022      Component Value Date/Time   NA 137 10/30/2022 0018   NA 142 10/06/2022 1111   K 4.1 10/30/2022 0018   CL 104 10/30/2022 0018   CO2 28 10/30/2022 0018   GLUCOSE 123 (H) 10/30/2022 0018   BUN 22 10/30/2022 0018   BUN 14 10/06/2022 1111   CREATININE 1.03 10/30/2022 0018   CALCIUM 8.3 (L) 10/30/2022 0018   PROT 5.9 (L) 09/10/2022 1743   PROT 6.8 06/13/2020 0819   ALBUMIN 2.8 (L) 09/10/2022 1743   ALBUMIN 3.7 06/13/2020 0819   AST 23 09/10/2022 1743   ALT 20 09/10/2022 1743   ALKPHOS 44 09/10/2022 1743   BILITOT 1.0 09/10/2022 1743   BILITOT 0.9 06/13/2020 0819   GFRNONAA >60 10/30/2022 0018   GFRAA 64 06/13/2020 0819   Lab Results  Component Value Date   CHOL 147 06/13/2020   HDL  57 06/13/2020   LDLCALC 67 06/13/2020   TRIG 129 06/13/2020   CHOLHDL 2.6 06/13/2020   No results found for: "HGBA1C" No results found for: "VITAMINB12" Lab Results  Component Value Date   TSH 1.890 12/12/2018    Levetiracetam level  12.8  Head CT/Cervical spine 11/06/21 1. No evidence of acute intracranial abnormality. 2. No evidence of acute fracture or traumatic malalignment in the cervical spine. 3. Small sclerotic bone lesions in the C6 and C7 vertebral bodies, probably benign bone islands in the absence of a known primary malignancy.   Routine EEG 01/14/2022 This is an abnormal EEG recording in the waking and sleeping state due to right temporal slowing which is consistent with an area of neuronal dysfunction within the right  temporal region    ASSESSMENT AND PLAN  82 y.o. year old male with multiple medical conditions including pulmonary hypertension requiring supplemental O2,  Memory problem, Heart failure, TIA who is presenting for follow up for his seizures and cognitive impairment.  Interim of the seizures, he is doing well, no seizure or seizure activity.  He is compliant with his Keppra 250/500.  Plan will be to continue patient on Keppra on the same dose.  For his cognitive impairment, his previous MMSE were normal but today on the MoCA score 25 out of 30, missed 4 points on delayed recall.  Plan will be to continue patient on Aricept.  I will see him in 1 year for follow-up or sooner if worse.    1. Seizure disorder (HCC)   2. Mild cognitive disorder     Patient Instructions  Continue Keppra 250 mg in the morning 500 mg at night Continue with Aricept 10 mg nightly Continue your other medications Continue to follow with your doctors Follow-up in 1 year or sooner if worse.  No orders of the defined types were placed in this encounter.   No orders of the defined types were placed in this encounter.   Return in about 1 year (around 07/21/2024).  I have spent a total of 30 minutes dedicated to this patient today, preparing to see patient, performing a medically appropriate examination and evaluation, ordering tests and/or medications and procedures, and counseling and educating the patient/family/caregiver; independently interpreting result and communicating results to the family/patient/caregiver; and documenting clinical information in the electronic medical record.  Windell Norfolk, MD 07/22/2023, 11:46 AM  [facility] [address] [place], [place] [address] [phone]

## 2023-07-22 NOTE — Patient Instructions (Signed)
Continue Keppra 250 mg in the morning 500 mg at night Continue with Aricept 10 mg nightly Continue your other medications Continue to follow with your doctors Follow-up in 1 year or sooner if worse.

## 2023-07-29 ENCOUNTER — Other Ambulatory Visit: Payer: Self-pay | Admitting: Neurology

## 2023-08-13 ENCOUNTER — Ambulatory Visit: Payer: Medicare Other | Attending: Cardiology | Admitting: Cardiology

## 2023-08-13 ENCOUNTER — Encounter: Payer: Self-pay | Admitting: Cardiology

## 2023-08-13 VITALS — BP 116/74 | HR 106 | Ht 66.0 in | Wt 170.0 lb

## 2023-08-13 DIAGNOSIS — J439 Emphysema, unspecified: Secondary | ICD-10-CM

## 2023-08-13 DIAGNOSIS — J431 Panlobular emphysema: Secondary | ICD-10-CM

## 2023-08-13 DIAGNOSIS — I272 Pulmonary hypertension, unspecified: Secondary | ICD-10-CM | POA: Diagnosis not present

## 2023-08-13 DIAGNOSIS — I5032 Chronic diastolic (congestive) heart failure: Secondary | ICD-10-CM

## 2023-08-13 DIAGNOSIS — R569 Unspecified convulsions: Secondary | ICD-10-CM

## 2023-08-13 NOTE — Patient Instructions (Signed)
Medication Instructions:  Your physician recommends that you continue on your current medications as directed. Please refer to the Current Medication list given to you today.  *If you need a refill on your cardiac medications before your next appointment, please call your pharmacy*   Lab Work: None If you have labs (blood work) drawn today and your tests are completely normal, you will receive your results only by: MyChart Message (if you have MyChart) OR A paper copy in the mail If you have any lab test that is abnormal or we need to change your treatment, we will call you to review the results.   Testing/Procedures: Your physician has requested that you have an echocardiogram. Echocardiography is a painless test that uses sound waves to create images of your heart. It provides your doctor with information about the size and shape of your heart and how well your heart's chambers and valves are working. This procedure takes approximately one hour. There are no restrictions for this procedure.    Follow-Up: At [facility], you and your health needs are our priority.  As part of our continuing mission to provide you with exceptional heart care, we have created designated Provider Care Teams.  These Care Teams include your primary Cardiologist (physician) and Advanced Practice Providers (APPs -  Physician Assistants and Nurse Practitioners) who all work together to provide you with the care you need, when you need it.  We recommend signing up for the patient portal called "MyChart".  Sign up information is provided on this After Visit Summary.  MyChart is used to connect with patients for Virtual Visits (Telemedicine).  Patients are able to view lab/test results, encounter notes, upcoming appointments, etc.  Non-urgent messages can be sent to your provider as well.   To learn more about what you can do with MyChart, go to [email or web address].     Other Instructions Echocardiogram An  echocardiogram is a test that uses sound waves (ultrasound) to produce images of the heart. Images from an echocardiogram can provide important information about: Heart size and shape. The size and thickness and movement of your heart's walls. Heart muscle function and strength. Heart valve function or if you have stenosis. Stenosis is when the heart valves are too narrow. If blood is flowing backward through the heart valves (regurgitation). A tumor or infectious growth around the heart valves. Areas of heart muscle that are not working well because of poor blood flow or injury from a heart attack. Aneurysm detection. An aneurysm is a weak or damaged part of an artery wall. The wall bulges out from the normal force of blood pumping through the body. Tell a health care provider about: Any allergies you have. All medicines you are taking, including vitamins, herbs, eye drops, creams, and over-the-counter medicines. Any blood disorders you have. Any surgeries you have had. Any medical conditions you have. Whether you are pregnant or may be pregnant. What are the risks? Generally, this is a safe test. However, problems may occur, including an allergic reaction to dye (contrast) that may be used during the test. What happens before the test? No specific preparation is needed. You may eat and drink normally. What happens during the test? You will take off your clothes from the waist up and put on a hospital gown. Electrodes or electrocardiogram (ECG)patches may be placed on your chest. The electrodes or patches are then connected to a device that monitors your heart rate and rhythm. You will lie down on a table for  an ultrasound exam. A gel will be applied to your chest to help sound waves pass through your skin. A handheld device, called a transducer, will be pressed against your chest and moved over your heart. The transducer produces sound waves that travel to your heart and bounce back (or  "echo" back) to the transducer. These sound waves will be captured in real-time and changed into images of your heart that can be viewed on a video monitor. The images will be recorded on a computer and reviewed by your health care provider. You may be asked to change positions or hold your breath for a short time. This makes it easier to get different views or better views of your heart. In some cases, you may receive contrast through an IV in one of your veins. This can improve the quality of the pictures from your heart. The procedure may vary among health care providers and hospitals.   What can I expect after the test? You may return to your normal, everyday life, including diet, activities, and medicines, unless your health care provider tells you not to do that. Follow these instructions at home: It is up to you to get the results of your test. Ask your health care provider, or the department that is doing the test, when your results will be ready. Keep all follow-up visits. This is important. Summary An echocardiogram is a test that uses sound waves (ultrasound) to produce images of the heart. Images from an echocardiogram can provide important information about the size and shape of your heart, heart muscle function, heart valve function, and other possible heart problems. You do not need to do anything to prepare before this test. You may eat and drink normally. After the echocardiogram is completed, you may return to your normal, everyday life, unless your health care provider tells you not to do that. This information is not intended to replace advice given to you by your health care provider. Make sure you discuss any questions you have with your health care provider. Document Revised: 03/26/2020 Document Reviewed: 03/26/2020 Elsevier Patient Education  2021 Elsevier Inc.      Follow-Up: At [facility], you and your health needs are our priority.  As part of our  continuing mission to provide you with exceptional heart care, we have created designated Provider Care Teams.  These Care Teams include your primary Cardiologist (physician) and Advanced Practice Providers (APPs -  Physician Assistants and Nurse Practitioners) who all work together to provide you with the care you need, when you need it.  We recommend signing up for the patient portal called "MyChart".  Sign up information is provided on this After Visit Summary.  MyChart is used to connect with patients for Virtual Visits (Telemedicine).  Patients are able to view lab/test results, encounter notes, upcoming appointments, etc.  Non-urgent messages can be sent to your provider as well.   To learn more about what you can do with MyChart, go to [email or web address].    Your next appointment:   6 month(s)  Provider:   Gypsy Balsam, MD    Other Instructions

## 2023-08-13 NOTE — Progress Notes (Unsigned)
Cardiology Office Note:    Date:  08/13/2023   ID:  Frank Meyer, DOB 1940/12/08, MRN [id number]  PCP:  Geoffry Paradise, MD  Cardiologist:  Gypsy Balsam, MD    Referring MD: Geoffry Paradise, MD     History of Present Illness:    Frank Meyer is a 82 y.o. male with past medical history significant for pulmonary hypertension last few echocardiogram did not confirm that they will echocardiogram recently did not show any TR.  His ejection fraction however was found to be 45%.  Also COPD/emphysema he was evaluated for coronary artery disease he does have some borderline lesion however fractional flow reserve was negative.  Comes today to months for follow-up doing fine.  He is oxygen all the time and he knows how to play with oxygen increase the flow when he goes to gym and try to exercise.  I have to admit he learn how to live with his condition quite nicely.  He does have seizure disorder send that being followed by neurology.  Past Medical History:  Diagnosis Date   AKI (acute kidney injury) (HCC) 08/03/2022   Allergic rhinitis 10/24/2009   Altered bowel habits 10/04/2020   Basal cell carcinoma    Benign prostatic hyperplasia with lower urinary tract symptoms 10/24/2009   Benign prostatic hypertrophy    Bradycardia 12/06/2019   Callosity 10/16/2013   Cardiomegaly 01/23/2015   Chronic diastolic heart failure (HCC) 12/19/2018   Chronic respiratory failure (HCC) 10/04/2020   Coronary artery calcification seen on CAT scan 05/28/2020   Dyslipidemia    Dyspnea on exertion 03/18/2018   Elevated troponin 08/03/2022   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   Encounter for general adult medical examination without abnormal findings 01/17/2015   Epistaxis 09/18/2016   Ex-cigarette smoker 01/20/2021   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   Gallbladder problem    Gastroesophageal reflux disease    Hiatal  hernia    Hilar adenopathy 03/25/2018   History of seizures 08/03/2022   History of smoking 03/18/2018   History of transient ischemic attack (TIA) 10/24/2009   Hyperlipidemia 10/24/2009   Insomnia 05/09/2013   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   Internal hemorrhoid 10/04/2020   Macular degeneration    Macular pucker, right eye 04/30/2020   Mediastinal adenopathy 03/25/2018   Memory loss 05/09/2013   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   Mild memory disturbance    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   Noninfective gastroenteritis and colitis, unspecified 10/04/2020   Nuclear sclerotic cataract of right eye 12/18/2019   Other specified symptoms and signs involving the digestive system and abdomen 10/04/2020   Overweight 09/14/2018   Oxygen dependent 10/04/2020   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   Personal history of colonic polyps 10/04/2020   Posterior vitreous detachment of right eye 12/18/2019   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   aspirin 81 MG EC tablet Take 81 mg by mouth daily.   atorvastatin (LIPITOR) 20 MG tablet Take 20 mg by mouth every evening.   cholecalciferol (VITAMIN D) 1000 UNITS tablet Take 1,000 Units by mouth daily.   donepezil (ARICEPT) 10 MG tablet Take 1 tablet (10 mg total) by mouth daily.   ENTRESTO 24-26 MG TAKE 1  TABLET BY MOUTH DAILY   fexofenadine (ALLEGRA) 180 MG tablet Take 180 mg by mouth daily.   finasteride (PROSCAR) 5 MG tablet Take 5 mg by mouth at bedtime.   furosemide (LASIX) 40 MG tablet Take 1 tablet (40 mg total) by mouth every other day. (Patient taking differently: Take 40 mg by mouth daily.)   ipratropium (ATROVENT) 0.06 % nasal spray Place 1-2 sprays into both nostrils See admin instructions. 2 sprays in the right nostril and 1 spray in the left nostril once daily   ipratropium-albuterol (DUONEB) 0.5-2.5 (3) MG/3ML SOLN USE 1 VIAL VIA NEBULIZER TWICE  DAILY (Patient taking differently: Take 3 mLs by nebulization 2 (two) times daily.)   levETIRAcetam (KEPPRA) 250 MG tablet TAKE 1 TABLET BY MOUTH IN THE  MORNING AND 2 TABLETS AT NIGHT (Patient taking differently: Take 250 mg by mouth 2 (two) times daily.)   Melatonin 10 MG TABS Take 20 mg by mouth at bedtime.   nitroGLYCERIN (NITROSTAT) 0.4 MG SL tablet Place 1 tablet (0.4 mg total) under the tongue every 5 (five) minutes as needed. Chest pain (Patient taking differently: Place 0.4 mg under the tongue every 5 (five) minutes as needed for chest pain. Chest pain)   omeprazole (PRILOSEC) 40 MG capsule Take 40 mg by mouth daily.    OXYGEN Inhale 2-5 L into the lungs as directed. 5lt during the day and 2lt at night   thiamine 100 MG tablet Take 1 tablet (100 mg total) by mouth daily.   traZODone (DESYREL) 100 MG tablet TAKE 2 TABLETS BY MOUTH AT  BEDTIME   umeclidinium-vilanterol (ANORO ELLIPTA) 62.5-25 MCG/ACT AEPB Inhale 1 puff into the lungs daily.     Allergies:   Patient has no known allergies.   Social History   Socioeconomic History   Marital status: Married    Spouse name: pamela   Number of children: 2   Years of education: college   Highest education level: Not on file  Occupational History   Occupation: retired  Tobacco Use   Smoking status: Former    Current packs/day: 0.00    Average packs/day: 2.0 packs/day for 25.0  years (50.0 ttl pk-yrs)    Types: Cigarettes    Start date: 8    Quit date: 1994    Years since quitting: 31.0   Smokeless tobacco: Never  Vaping Use   Vaping status: Never Used  Substance and Sexual Activity   Alcohol use: Yes    Alcohol/week: 15.0 - 20.0 standard drinks of alcohol    Types: 15 - 20 Cans of beer per week   Drug use: Never   Sexual activity: Not Currently  Other Topics Concern   Not on file  Social History Narrative   Pt is retired. He is married. The pt lives with his wife. He has a Chiropodist. The pt has 2 children.      Patient drinks 2-3 cups of caffeine daily.   Patient is right handed.    Social Drivers of Corporate investment banker Strain: Not on file  Food Insecurity: No Food Insecurity (  10/28/2022)   Hunger Vital Sign    Worried About Running Out of Food in the Last Year: Never true    Ran Out of Food in the Last Year: Never true  Transportation Needs: No Transportation Needs (10/28/2022)   PRAPARE - Administrator, Civil Service (Medical): No    Lack of Transportation (Non-Medical): No  Physical Activity: Not on file  Stress: Not on file  Social Connections: Not on file     Family History: The patient's family history includes Memory loss in his mother; Obesity in his mother. ROS:   Please see the history of present illness.    All 14 point review of systems negative except as described per history of present illness  EKGs/Labs/Other Studies Reviewed:         Recent Labs: 09/10/2022: ALT 20 10/27/2022: B Natriuretic Peptide 125.4 10/30/2022: BUN 22; Creatinine, Ser 1.03; Hemoglobin 10.9; Magnesium 2.4; Platelets 237; Potassium 4.1; Sodium 137  Recent Lipid Panel    Component Value Date/Time   CHOL 147 06/13/2020 0819   TRIG 129 06/13/2020 0819   HDL 57 06/13/2020 0819   CHOLHDL 2.6 06/13/2020 0819   LDLCALC 67 06/13/2020 0819    Physical Exam:    VS:  BP 116/74 (BP Location: Right Arm, Patient Position:  Sitting)   Pulse (!) 106   Ht 5\' 6"  (1.676 m)   Wt 170 lb (77.1 kg)   SpO2 90%   BMI 27.44 kg/m     Wt Readings from Last 3 Encounters:  08/13/23 170 lb (77.1 kg)  07/21/23 173 lb 9.6 oz (78.7 kg)  01/15/23 173 lb 3.2 oz (78.6 kg)     GEN:  Well nourished, well developed in no acute distress HEENT: Normal NECK: No JVD; No carotid bruits LYMPHATICS: No lymphadenopathy CARDIAC: RRR, no murmurs, no rubs, no gallops RESPIRATORY:  Clear to auscultation without rales, wheezing or rhonchi  ABDOMEN: Soft, non-tender, non-distended MUSCULOSKELETAL:  No edema; No deformity  SKIN: Warm and dry LOWER EXTREMITIES: no swelling NEUROLOGIC:  Alert and oriented x 3 PSYCHIATRIC:  Normal affect   ASSESSMENT:    1. Chronic diastolic CHF (congestive heart failure) (HCC)   2. Pulmonary hypertension (HCC)   3. Panlobular emphysema (HCC)   4. Pulmonary emphysema, unspecified emphysema type (HCC)   5. Seizures (HCC)    PLAN:    In order of problems listed above:  Chronic diastolic congestive heart failure seems compensated on physical exam we will continue present management. Pulmonary hypertension with questionable diagnosis not confirmed on latest echocardiogram will repeat echocardiogram. 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 Seizure disorder followed by neurology   Medication Adjustments/Labs and Tests Ordered: Current medicines are reviewed at length with the patient today.  Concerns regarding medicines are outlined above.  No orders of the defined types were placed in this encounter.  Medication changes: No orders of the defined types were placed in this encounter.   Signed, Georgeanna Lea, MD, [facility] 08/13/2023 3:50 PM    [facility]

## 2023-08-16 NOTE — Progress Notes (Signed)
 [facility] - Clinic Note  08/24/2023     CHIEF COMPLAINT Patient presents for Retina Follow Up   HISTORY OF PRESENT ILLNESS: Frank Meyer is a 82 y.o. male who presents to the clinic today for:   HPI     Retina Follow Up   Patient presents with  Wet AMD.  In both eyes.  This started 8 weeks ago.  Duration of 8 weeks.  Since onset it is stable.  I, the attending physician,  performed the HPI with the patient and updated documentation appropriately.        Comments   8 week retina follow up ARMD OU and IVA OS pt is reporting no vision changes noticed he denies any flashes or floaters       Last edited by Valdemar Rogue, MD on 08/24/2023  4:54 PM.     Pt states vision is fine, he feels like it's fixed, he is having cataract sx OD on Friday with Dr. Octavia  Referring physician: Shepard Ade, MD [address] [place],  [place] [address]  HISTORICAL INFORMATION:   Selected notes from the MEDICAL RECORD NUMBER [name] pt, transferring care due to insurance LEE: 09.12.23 -- IVA OU Ocular Hx- PMH-    CURRENT MEDICATIONS: No current outpatient medications on file. (Ophthalmic Drugs)   No current facility-administered medications for this visit. (Ophthalmic Drugs)   Current Outpatient Medications (Other)  Medication Sig   albuterol  (VENTOLIN  HFA) 108 (90 Base) MCG/ACT inhaler Inhale 1-2 puffs into the lungs every 4 (four) hours as needed for wheezing or shortness of breath.   aspirin  81 MG EC tablet Take 81 mg by mouth daily.   atorvastatin  (LIPITOR) 20 MG tablet Take 20 mg by mouth every evening.   cholecalciferol  (VITAMIN D ) 1000 UNITS tablet Take 1,000 Units by mouth daily.   donepezil  (ARICEPT ) 10 MG tablet Take 1 tablet (10 mg total) by mouth daily.   ENTRESTO  24-26 MG TAKE 1 TABLET BY MOUTH DAILY   fexofenadine (ALLEGRA) 180 MG tablet Take 180 mg by mouth daily.   finasteride  (PROSCAR ) 5 MG tablet Take 5 mg by mouth at bedtime.   furosemide   (LASIX ) 40 MG tablet Take 1 tablet (40 mg total) by mouth every other day. (Patient taking differently: Take 40 mg by mouth daily.)   ipratropium (ATROVENT ) 0.06 % nasal spray Place 1-2 sprays into both nostrils See admin instructions. 2 sprays in the right nostril and 1 spray in the left nostril once daily   ipratropium-albuterol  (DUONEB) 0.5-2.5 (3) MG/3ML SOLN USE 1 VIAL VIA NEBULIZER TWICE  DAILY (Patient taking differently: Take 3 mLs by nebulization 2 (two) times daily.)   levETIRAcetam  (KEPPRA ) 250 MG tablet TAKE 1 TABLET BY MOUTH IN THE  MORNING AND 2 TABLETS AT NIGHT (Patient taking differently: Take 250 mg by mouth 2 (two) times daily.)   Melatonin 10 MG TABS Take 20 mg by mouth at bedtime.   nitroGLYCERIN  (NITROSTAT ) 0.4 MG SL tablet Place 1 tablet (0.4 mg total) under the tongue every 5 (five) minutes as needed. Chest pain (Patient taking differently: Place 0.4 mg under the tongue every 5 (five) minutes as needed for chest pain. Chest pain)   omeprazole (PRILOSEC) 40 MG capsule Take 40 mg by mouth daily.    OXYGEN Inhale 2-5 L into the lungs as directed. 5lt during the day and 2lt at night   thiamine  100 MG tablet Take 1 tablet (100 mg total) by mouth daily.   traZODone  (  DESYREL ) 100 MG tablet TAKE 2 TABLETS BY MOUTH AT  BEDTIME   umeclidinium-vilanterol (ANORO ELLIPTA ) 62.5-25 MCG/ACT AEPB Inhale 1 puff into the lungs daily.   No current facility-administered medications for this visit. (Other)   REVIEW OF SYSTEMS: ROS   Positive for: Cardiovascular, Eyes, Respiratory Negative for: Constitutional, Gastrointestinal, Neurological, Skin, Genitourinary, Musculoskeletal, HENT, Endocrine, Psychiatric, Allergic/Imm, Heme/Lymph Last edited by Resa Delon ORN, COT on 08/24/2023 10:12 AM.      ALLERGIES No Known Allergies  PAST MEDICAL HISTORY Past Medical History:  Diagnosis Date   AKI (acute kidney injury) (HCC) 08/03/2022   Allergic rhinitis 10/24/2009   Altered bowel habits  10/04/2020   Basal cell carcinoma    Benign prostatic hyperplasia with lower urinary tract symptoms 10/24/2009   Benign prostatic hypertrophy    Bradycardia 12/06/2019   Callosity 10/16/2013   Cardiomegaly 01/23/2015   Chronic diastolic heart failure (HCC) 12/19/2018   Chronic respiratory failure (HCC) 10/04/2020   Coronary artery calcification seen on CAT scan 05/28/2020   Dyslipidemia    Dyspnea on exertion 03/18/2018   Elevated troponin 08/03/2022   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   Encounter for general adult medical examination without abnormal findings 01/17/2015   Epistaxis 09/18/2016   Ex-cigarette smoker 01/20/2021   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   Gallbladder problem    Gastroesophageal reflux disease    Hiatal hernia    Hilar adenopathy 03/25/2018   History of seizures 08/03/2022   History of smoking 03/18/2018   History of transient ischemic attack (TIA) 10/24/2009   Hyperlipidemia 10/24/2009   Insomnia 05/09/2013   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   Internal hemorrhoid 10/04/2020   Macular degeneration    Macular pucker, right eye 04/30/2020   Mediastinal adenopathy 03/25/2018   Memory loss 05/09/2013   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   Mild memory disturbance    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   Noninfective gastroenteritis and colitis, unspecified 10/04/2020   Nuclear sclerotic cataract of right eye 12/18/2019   Other specified symptoms and signs involving the digestive system and abdomen 10/04/2020   Overweight 09/14/2018   Oxygen dependent 10/04/2020   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   Personal history of colonic polyps 10/04/2020   Posterior vitreous detachment of right eye 12/18/2019   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   Pulmonary hypertension (HCC)    Right  upper lobe pulmonary nodule 03/25/2018   Syncope and collapse 12/12/2018   TIA (transient ischemic attack)    Transient ischemic attack 10/24/2009   Vasomotor rhinitis 09/18/2016   Weight decreased 10/04/2020   Past Surgical History:  Procedure Laterality Date   CHOLECYSTECTOMY     none     FAMILY HISTORY Family History  Problem Relation Age of Onset   Obesity Mother    Memory loss Mother    SOCIAL HISTORY Social History   Tobacco Use   Smoking status: Former    Current packs/day: 0.00    Average packs/day: 2.0 packs/day for 25.0 years (50.0 ttl pk-yrs)    Types: Cigarettes    Start date: 4    Quit date: 1994    Years since quitting: 31.0   Smokeless tobacco: Never  Vaping Use   Vaping status: Never Used  Substance Use Topics   Alcohol use: Yes    Alcohol/week: 15.0 - 20.0 standard drinks of alcohol    Types: 15 - 20 Cans of beer per  week   Drug use: Never       OPHTHALMIC EXAM:  Base Eye Exam     Visual Acuity (Snellen - Linear)       Right Left   Dist [place] 20/50 20/30 -2   Dist ph [place] 20/40 -2 NI         Tonometry (Tonopen, 10:19 AM)       Right Left   Pressure 10 11         Pupils       Pupils Dark Light Shape React APD   Right PERRL 3 2 Round Brisk None   Left PERRL 3 2 Round Brisk None         Visual Fields       Left Right    Full Full         Extraocular Movement       Right Left    Full, Ortho Full, Ortho         Neuro/Psych     Oriented x3: Yes   Mood/Affect: Normal         Dilation     Both eyes: 2.5% Phenylephrine @ 10:19 AM           Slit Lamp and Fundus Exam     External Exam       Right Left   External Normal Normal         Slit Lamp Exam       Right Left   Lids/Lashes Dermatochalasis - upper lid, Meibomian gland dysfunction Dermatochalasis - upper lid, Meibomian gland dysfunction   Conjunctiva/Sclera White and quiet White and quiet   Cornea arcus, 1-2+ inferior punctate epithelial  erosions arcus, 1-2+ Punctate epithelial erosions, well healed cataract wound   Anterior Chamber deep and clear deep and clear   Iris Round and dilated Round and dilated   Lens 3+ Nuclear sclerosis with brunescence, 3+ Cortical cataract PC IOL in good position   Anterior Vitreous Vitreous syneresis Vitreous syneresis, silicone oil microbubbles         Fundus Exam       Right Left   Posterior Vitreous Posterior vitreous detachment Posterior vitreous detachment, vitreous condensations   Disc Pink and Sharp Pink and Sharp   C/D Ratio 0.2 0.2   Macula Flat, good foveal reflex, Drusen, RPE mottling and clumping, mild atrophy temporal mac Flat, Blunted foveal reflex, RPE mottling, low PED/CNV with interval improvement in overlying edema, no frank heme   Vessels attenuated, mild tortuosity attenuated, Tortuous   Periphery Attached, mild reticular degeneration, No heme Attached, mild reticular degeneration, No heme, atrophic and pigmented CR scarring temporal periphery           IMAGING AND PROCEDURES  Imaging and Procedures for 08/24/2023  OCT, Retina - OU - Both Eyes       Right Eye Quality was borderline. Central Foveal Thickness: 309. Progression has been stable. Findings include normal foveal contour, no IRF, no SRF, retinal drusen , outer retinal atrophy (Focal ORA temporal macula, no fluid or edema, trace cystic changes vs schisis nasal mac -- persistent).   Left Eye Quality was borderline. Central Foveal Thickness: 292. Progression has improved. Findings include no IRF, no SRF, abnormal foveal contour, retinal drusen , subretinal hyper-reflective material, pigment epithelial detachment (Interval improvement in [facility] / edema overlying low PED nasal fovea/macula).   Notes *Images captured and stored on drive  Diagnosis / Impression:  OD: Focal ORA temporal macula; no fluid or edema; trace cystic  changes vs schisis nasal mac -- persistent OS: interval improvement in [facility] / edema  overlying low PED nasal fovea/macula  Clinical management:  See below  Abbreviations: NFP - Normal foveal profile. CME - cystoid macular edema. PED - pigment epithelial detachment. IRF - intraretinal fluid. SRF - subretinal fluid. EZ - ellipsoid zone. ERM - epiretinal membrane. ORA - outer retinal atrophy. ORT - outer retinal tubulation. SRHM - subretinal hyper-reflective material. IRHM - intraretinal hyper-reflective material      Intravitreal Injection, Pharmacologic Agent - OS - Left Eye       Time Out 08/24/2023. 11:32 AM. Confirmed correct patient, procedure, site, and patient consented.   Anesthesia Topical anesthesia was used. Anesthetic medications included Lidocaine 2%, Proparacaine 0.5%.   Procedure Preparation included 5% betadine to ocular surface, eyelid speculum. A supplied (32g) needle was used.   Injection: 1.25 mg Bevacizumab  1.25mg /0.74ml   Route: Intravitreal, Site: Left Eye   NDC: [id number], Lot: [id number], Expiration date: 10/02/2023   Post-op Post injection exam found visual acuity of at least counting fingers. The patient tolerated the procedure well. There were no complications. The patient received written and verbal post procedure care education.            ASSESSMENT/PLAN:    ICD-10-CM   1. 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 Combined forms of age-related cataract of right eye  H25.811     3. Pseudophakia  Z96.1      Exudative age related macular degeneration, both eyes  - Dr. 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 Elner)   - s/p IVA OS #1 (01.22.24), #2 (03.27.24), #3 (05.22.24), #4 (07.24.24), #5 (09.18.24), #5 (11.13.24) **history of increased  SRHM/edema and decreased VA OS at 9 wks on 07.24.24**  - BCVA OD decreased to 20/40 from 20/25 (cataract progression); OS is stable at 20/30  - OCT shows OD: Focal ORA temporal macula, no fluid or edema trace cystic changes vs schisis nasal mac; OS: interval improvement in [facility] / edema overlying low PED nasal fovea/macula at 8 weeks - recommend IVA OS #7 today, 01.07.25 with f/u in 8 weeks again - recommend holding IVA OD again -- pt in agreement  - pt wishes proceed with injection OS  - RBA of procedure discussed, questions answered - informed consent obtained and signed 01.22.24 (OS) - see procedure note - Eylea approved for 2025 -- but cannot get Good Days due to no funding  - f/u in 8 wks, sooner prn -- DFE/OCT, possible injection(s)  2. Mixed Cataract OD - The symptoms of cataract, surgical options, and treatments and risks were discussed with patient. - discussed diagnosis and progression - surgery scheduled for Friday, August 27, 2023 with Dr. Octavia  3. Pseudophakia OS  - s/p CE/IOL OS  - IOL in good position, doing well  - monitor  Ophthalmic Meds Ordered this visit:  Meds ordered this encounter  Medications   Bevacizumab  (AVASTIN ) SOLN 1.25 mg     Return in about 8 weeks (around 10/19/2023) for ex ARMD OS, Dilated Exam, OCT, Possible Injxn.  There are no Patient Instructions on file for this visit.   Explained the diagnoses, plan, and follow up with  the patient and they expressed understanding.  Patient expressed understanding of the importance of proper follow up care.   This document serves as a record of services personally performed by Redell JUDITHANN Hans, MD, PhD. It was created on their behalf by Wanda GEANNIE Keens, COT an ophthalmic technician. The creation of this record is the provider's dictation and/or activities during the visit.    Electronically signed by:  Wanda GEANNIE Keens, COT  08/24/23 11:29 PM  This document serves as a record of services personally  performed by Redell JUDITHANN Hans, MD, PhD. It was created on their behalf by Alan PARAS. Delores, OA an ophthalmic technician. The creation of this record is the provider's dictation and/or activities during the visit.    Electronically signed by: Alan PARAS. Delores, OA 08/24/23 11:29 PM   Redell JUDITHANN Hans, M.D., Ph.D. Diseases & Surgery of the Retina and Vitreous [facility]  I have reviewed the above documentation for accuracy and completeness, and I agree with the above. Redell JUDITHANN Hans, M.D., Ph.D. 08/24/23 11:29 PM    Abbreviations: M myopia (nearsighted); A astigmatism; H hyperopia (farsighted); P presbyopia; Mrx spectacle prescription;  CTL contact lenses; OD right eye; OS left eye; OU both eyes  XT exotropia; ET esotropia; PEK punctate epithelial keratitis; PEE punctate epithelial erosions; DES dry eye syndrome; MGD meibomian gland dysfunction; ATs artificial tears; PFAT's preservative free artificial tears; NSC nuclear sclerotic cataract; PSC posterior subcapsular cataract; ERM epi-retinal membrane; PVD posterior vitreous detachment; RD retinal detachment; DM diabetes mellitus; DR diabetic retinopathy; NPDR non-proliferative diabetic retinopathy; PDR proliferative diabetic retinopathy; CSME clinically significant macular edema; DME diabetic macular edema; dbh dot blot hemorrhages; CWS cotton wool spot; POAG primary open angle glaucoma; C/D cup-to-disc ratio; HVF humphrey visual field; GVF goldmann visual field; OCT optical coherence tomography; IOP intraocular pressure; BRVO Branch retinal vein occlusion; CRVO central retinal vein occlusion; CRAO central retinal artery occlusion; BRAO branch retinal artery occlusion; RT retinal tear; SB scleral buckle; PPV pars plana vitrectomy; VH Vitreous hemorrhage; PRP panretinal laser photocoagulation; IVK intravitreal kenalog; VMT vitreomacular traction; MH Macular hole;  NVD neovascularization of the disc; NVE neovascularization elsewhere;  AREDS age related eye disease study; ARMD age related macular degeneration; POAG primary open angle glaucoma; EBMD epithelial/anterior basement membrane dystrophy; ACIOL anterior chamber intraocular lens; IOL intraocular lens; PCIOL posterior chamber intraocular lens; Phaco/IOL phacoemulsification with intraocular lens placement; PRK photorefractive keratectomy; LASIK laser assisted in situ keratomileusis; HTN hypertension; DM diabetes mellitus; COPD chronic obstructive pulmonary disease

## 2023-08-24 ENCOUNTER — Ambulatory Visit (INDEPENDENT_AMBULATORY_CARE_PROVIDER_SITE_OTHER): Payer: Medicare Other | Admitting: Ophthalmology

## 2023-08-24 ENCOUNTER — Encounter (INDEPENDENT_AMBULATORY_CARE_PROVIDER_SITE_OTHER): Payer: Self-pay | Admitting: Ophthalmology

## 2023-08-24 DIAGNOSIS — H25811 Combined forms of age-related cataract, right eye: Secondary | ICD-10-CM | POA: Diagnosis not present

## 2023-08-24 DIAGNOSIS — H353231 Exudative age-related macular degeneration, bilateral, with active choroidal neovascularization: Secondary | ICD-10-CM

## 2023-08-24 DIAGNOSIS — Z961 Presence of intraocular lens: Secondary | ICD-10-CM

## 2023-08-24 MED ORDER — BEVACIZUMAB CHEMO INJECTION 1.25MG/0.05ML SYRINGE FOR KALEIDOSCOPE
1.2500 mg | INTRAVITREAL | Status: AC | PRN
  Administered 2023-08-24: 1.25 mg via INTRAVITREAL

## 2023-08-25 ENCOUNTER — Encounter (INDEPENDENT_AMBULATORY_CARE_PROVIDER_SITE_OTHER): Payer: Medicare Other | Admitting: Ophthalmology

## 2023-08-27 ENCOUNTER — Other Ambulatory Visit: Payer: Self-pay | Admitting: Neurology

## 2023-09-14 ENCOUNTER — Ambulatory Visit (HOSPITAL_BASED_OUTPATIENT_CLINIC_OR_DEPARTMENT_OTHER)
Admission: RE | Admit: 2023-09-14 | Discharge: 2023-09-14 | Disposition: A | Discharge status: Home / Self Care | Payer: Medicare Other | Source: Ambulatory Visit | Attending: Cardiology | Admitting: Cardiology

## 2023-09-14 DIAGNOSIS — J431 Panlobular emphysema: Secondary | ICD-10-CM | POA: Diagnosis present

## 2023-09-14 DIAGNOSIS — I5032 Chronic diastolic (congestive) heart failure: Secondary | ICD-10-CM

## 2023-09-14 DIAGNOSIS — I272 Pulmonary hypertension, unspecified: Secondary | ICD-10-CM

## 2023-09-14 LAB — ECHOCARDIOGRAM COMPLETE
AR max vel: 2.1 cm2
AV Area VTI: 2.2 cm2
AV Area mean vel: 2.07 cm2
AV Mean grad: 4 mm[Hg]
AV Peak grad: 6.8 mm[Hg]
AV Vena cont: 0.3 cm
Ao pk vel: 1.3 m/s
Area-P 1/2: 3.02 cm2
Calc EF: 59.7 %
MV M vel: 1.55 m/s
MV Peak grad: 9.6 mm[Hg]
P 1/2 time: 731 ms
S' Lateral: 2.9 cm
Single Plane A2C EF: 60.3 %
Single Plane A4C EF: 61.9 %

## 2023-09-27 ENCOUNTER — Encounter (INDEPENDENT_AMBULATORY_CARE_PROVIDER_SITE_OTHER): Payer: Medicare Other | Admitting: Ophthalmology

## 2023-10-04 ENCOUNTER — Other Ambulatory Visit: Payer: Self-pay | Admitting: Neurology

## 2023-10-14 ENCOUNTER — Ambulatory Visit: Payer: Medicare Other | Admitting: Pulmonary Disease

## 2023-10-14 ENCOUNTER — Encounter: Payer: Self-pay | Admitting: Pulmonary Disease

## 2023-10-14 VITALS — BP 98/64 | HR 101 | Ht 66.0 in | Wt 167.0 lb

## 2023-10-14 DIAGNOSIS — I272 Pulmonary hypertension, unspecified: Secondary | ICD-10-CM

## 2023-10-14 DIAGNOSIS — J439 Emphysema, unspecified: Secondary | ICD-10-CM | POA: Diagnosis not present

## 2023-10-14 DIAGNOSIS — J9612 Chronic respiratory failure with hypercapnia: Secondary | ICD-10-CM

## 2023-10-14 DIAGNOSIS — J9611 Chronic respiratory failure with hypoxia: Secondary | ICD-10-CM

## 2023-10-14 NOTE — Progress Notes (Signed)
 Synopsis: Referred in April 2024 for acute visit, patient of Dr. Judeth Horn with emphysema  Subjective:   PATIENT ID: Frank Meyer GENDER: male DOB: August 30, 1940, MRN: [id number]  HPI  Chief Complaint  Patient presents with   Follow-up    Pt states he's been doing good , stable .   Frank Meyer is an 83 year old male, former smoker with COPD/Emphysema, chronic hypoxemic respiratory failure, pulmonary hypertension, and diastolic heart failure who returns to pulmonary clinic for follow up.   Initial visit 11/18/22 He was treated for COPD exacerbation 10/12/22 by his primary care team. He was admitted 1/26 to 1/31 for acute on chronic respiratory failure due to heart failure exacerbation and COPD exacerbation along with multifocal pneumonia. He completed his course of antibiotics and steroid taper.  He went to 4 pulmonary rehab sessions without issues. Yesterday, at pulmonary rehab he had significant increase in his O2 needs and was placed on non-rebreather mask.   He is overall feeling ok. He denies fevers, chills or sweats. Denies increased mucous production. Denies increased edema. He has a blood pressure and daily weight log. His weights have been stable between 166-169lbs and his blood pressure has been stable.    He is alert and oriented time 4. No confusion noted.  Patient walked in hallway on 6L oxygen with oxygen saturations above 92% on first lap (229ft). 50 ft into second lap, patient's oxygen saturations dropped to 79%. He remained asymptomatic. Pleth was strong on monitor. He recovered above 88% within a minute of rest.   OV 07/21/23 The patient, with a history of significant lung disease including pulmonary hypertension and COPD, presents for a routine follow-up. He reports managing his condition with Anoro, a nebulizer treatment twice daily, and an albuterol inhaler as needed. Despite requiring high levels of supplemental oxygen, the patient remains active and exercises  regularly, even going to the gym. He acknowledges that his oxygen levels can drop into the seventies during activity but recover upon rest. The patient also reports memory issues and a family history of dementia or Alzheimer's, which is a concern for him. He is currently under palliative care and has a DNR order in place. The patient expresses understanding of his condition and appears prepared for potential future acute illnesses.  OV 10/14/23 He uses oxygen at a flow rate of 12-15 liters per minute during exercise and 6 liters per minute while sitting. He stopped smoking at the age of 82.  He maintains physical activity by attending the gym two to three times a week, performing seated exercises due to significant oxygen desaturation with walking. He uses two oxygen bottles during workouts, which last about 20 to 25 minutes each, switching bottles between exercises.  Following a hospital discharge where he was informed that no further interventions could be offered, he believed he had a limited time to live. This led him to contact friends to say goodbye. Despite this, he has remained stable and continues to live independently with his wife. He is involved with palliative care as a precautionary measure, anticipating a transition to hospice care if his condition worsens.  He has concerns about the future, particularly regarding the potential loss of independence and the need for increased oxygen support. He is concerned about the impact of his health on his wife, especially if he becomes bedbound and requires more intensive care. He is contemplating end-of-life decisions and prefers not to pass away at home to avoid burdening his wife. He has considered hospice  care for when he can no longer maintain his independence.  He continues to see his primary care provider's physician assistant monthly and has biannual visits with his primary care doctor. He is aware of the potential for infections, particularly  during the winter months, and has previously been hospitalized for pneumonia.  Past Medical History:  Diagnosis Date   AKI (acute kidney injury) (HCC) 08/03/2022   Allergic rhinitis 10/24/2009   Altered bowel habits 10/04/2020   Basal cell carcinoma    Benign prostatic hyperplasia with lower urinary tract symptoms 10/24/2009   Benign prostatic hypertrophy    Bradycardia 12/06/2019   Callosity 10/16/2013   Cardiomegaly 01/23/2015   Chronic diastolic heart failure (HCC) 12/19/2018   Chronic respiratory failure (HCC) 10/04/2020   Coronary artery calcification seen on CAT scan 05/28/2020   Dyslipidemia    Dyspnea on exertion 03/18/2018   Elevated troponin 08/03/2022   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   Encounter for general adult medical examination without abnormal findings 01/17/2015   Epistaxis 09/18/2016   Ex-cigarette smoker 01/20/2021   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   Gallbladder problem    Gastroesophageal reflux disease    Hiatal hernia    Hilar adenopathy 03/25/2018   History of seizures 08/03/2022   History of smoking 03/18/2018   History of transient ischemic attack (TIA) 10/24/2009   Hyperlipidemia 10/24/2009   Insomnia 05/09/2013   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   Internal hemorrhoid 10/04/2020   Macular degeneration    Macular pucker, right eye 04/30/2020   Mediastinal adenopathy 03/25/2018   Memory loss 05/09/2013   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   Mild memory disturbance    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   Noninfective gastroenteritis and colitis, unspecified 10/04/2020   Nuclear sclerotic cataract of right eye 12/18/2019   Other specified symptoms and signs involving the digestive system and abdomen 10/04/2020   Overweight 09/14/2018   Oxygen dependent 10/04/2020   Panlobular emphysema (HCC)  03/18/2018   Personal history of colonic polyps 10/04/2020   Posterior vitreous detachment of right eye 12/18/2019   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   Pulmonary hypertension (HCC)    Right upper lobe pulmonary nodule 03/25/2018   Syncope and collapse 12/12/2018   TIA (transient ischemic attack)    Transient ischemic attack 10/24/2009   Vasomotor rhinitis 09/18/2016   Weight decreased 10/04/2020     Family History  Problem Relation Age of Onset   Obesity Mother    Memory loss Mother      Social History   Socioeconomic History   Marital status: Married    Spouse name: pamela   Number of children: 2   Years of education: college   Highest education level: Not on file  Occupational History   Occupation: retired  Tobacco Use   Smoking status: Former    Current packs/day: 0.00    Average packs/day: 2.0 packs/day for 25.0 years (50.0 ttl pk-yrs)    Types: Cigarettes    Start date: 1969    Quit date: 1994    Years since quitting: 31.1   Smokeless tobacco: Never  Vaping Use   Vaping status: Never Used  Substance and Sexual Activity   Alcohol use: Yes    Alcohol/week: 15.0 - 20.0 standard drinks of alcohol    Types: 15 - 20 Cans of beer per week   Drug use: Never   Sexual activity: Not Currently  Other Topics Concern   Not on file  Social History Narrative   Pt is retired. He is married. The pt lives with his wife. He has a Chiropodist. The pt has 2 children.      Patient drinks 2-3 cups of caffeine daily.   Patient is right handed.    Social Drivers of Corporate investment banker Strain: Not on file  Food Insecurity: No Food Insecurity (10/28/2022)   Hunger Vital Sign    Worried About Running Out of Food in the Last Year: Never true    Ran Out of Food in the Last Year: Never true  Transportation Needs: No Transportation Needs (10/28/2022)   PRAPARE - Administrator, Civil Service (Medical): No    Lack of  Transportation (Non-Medical): No  Physical Activity: Not on file  Stress: Not on file  Social Connections: Not on file  Intimate Partner Violence: Not At Risk (10/28/2022)   Humiliation, Afraid, Rape, and Kick questionnaire    Fear of Current or Ex-Partner: No    Emotionally Abused: No    Physically Abused: No    Sexually Abused: No     No Known Allergies   Outpatient Medications Prior to Visit  Medication Sig Dispense Refill   albuterol (VENTOLIN HFA) 108 (90 Base) MCG/ACT inhaler Inhale 1-2 puffs into the lungs every 4 (four) hours as needed for wheezing or shortness of breath.     aspirin 81 MG EC tablet Take 81 mg by mouth daily.     atorvastatin (LIPITOR) 20 MG tablet Take 20 mg by mouth every evening.     cetirizine (ZYRTEC) 5 MG chewable tablet Chew 10 mg by mouth daily.     cholecalciferol (VITAMIN D) 1000 UNITS tablet Take 1,000 Units by mouth daily.     donepezil (ARICEPT) 10 MG tablet TAKE 1 TABLET BY MOUTH DAILY 60 tablet 5   ENTRESTO 24-26 MG TAKE 1 TABLET BY MOUTH DAILY 100 tablet 2   finasteride (PROSCAR) 5 MG tablet Take 5 mg by mouth at bedtime.     furosemide (LASIX) 40 MG tablet Take 1 tablet (40 mg total) by mouth every other day. (Patient taking differently: Take 40 mg by mouth daily.) 30 tablet 0   ipratropium (ATROVENT) 0.06 % nasal spray Place 1-2 sprays into both nostrils See admin instructions. 2 sprays in the right nostril and 1 spray in the left nostril once daily     ipratropium-albuterol (DUONEB) 0.5-2.5 (3) MG/3ML SOLN USE 1 VIAL VIA NEBULIZER TWICE  DAILY (Patient taking differently: Take 3 mLs by nebulization 2 (two) times daily.) 720 mL 2   levETIRAcetam (KEPPRA) 250 MG tablet TAKE 1 TABLET BY MOUTH IN THE  MORNING AND 2 TABLETS AT NIGHT 300 tablet 2   Melatonin 10 MG TABS Take 20 mg by mouth at bedtime.     omeprazole (PRILOSEC) 40 MG capsule Take 40 mg by mouth daily.      OXYGEN Inhale 2-5 L into the lungs as directed. 5lt during the day and 2lt at  night     thiamine 100 MG tablet Take 1 tablet (100 mg total) by mouth daily. 30 tablet 0   traZODone (DESYREL) 100 MG tablet TAKE 2 TABLETS BY MOUTH AT  BEDTIME 60 tablet 11   umeclidinium-vilanterol (ANORO ELLIPTA) 62.5-25 MCG/ACT AEPB Inhale 1 puff into the lungs daily. 60 each 11   fexofenadine (ALLEGRA) 180 MG tablet Take 180 mg by mouth daily. (Patient not taking: Reported on  10/14/2023)     nitroGLYCERIN (NITROSTAT) 0.4 MG SL tablet Place 1 tablet (0.4 mg total) under the tongue every 5 (five) minutes as needed. Chest pain (Patient taking differently: Place 0.4 mg under the tongue every 5 (five) minutes as needed for chest pain. Chest pain) 25 tablet 6   No facility-administered medications prior to visit.   Review of Systems  Constitutional:  Negative for chills, fever, malaise/fatigue and weight loss.  HENT:  Negative for congestion, sinus pain and sore throat.   Eyes: Negative.   Respiratory:  Positive for cough and shortness of breath. Negative for hemoptysis, sputum production and wheezing.   Cardiovascular:  Negative for chest pain, palpitations, orthopnea, claudication and leg swelling.  Gastrointestinal:  Negative for abdominal pain, heartburn, nausea and vomiting.  Genitourinary: Negative.   Musculoskeletal:  Negative for joint pain and myalgias.  Skin:  Negative for rash.  Neurological:  Negative for weakness.  Endo/Heme/Allergies: Negative.   Psychiatric/Behavioral: Negative.     Objective:   Vitals:   10/14/23 1033  BP: 98/64  Pulse: (!) 101  SpO2: 97%  Weight: 167 lb (75.8 kg)  Height: 5\' 6"  (1.676 m)    Physical Exam Constitutional:      General: He is not in acute distress. HENT:     Head: Normocephalic and atraumatic.  Eyes:     Conjunctiva/sclera: Conjunctivae normal.  Cardiovascular:     Rate and Rhythm: Normal rate and regular rhythm.     Pulses: Normal pulses.     Heart sounds: Normal heart sounds. No murmur heard. Pulmonary:     Effort:  Pulmonary effort is normal.     Breath sounds: Rales (mild scattered) present. No wheezing.  Musculoskeletal:     Right lower leg: No edema.     Left lower leg: No edema.  Skin:    General: Skin is warm and dry.  Neurological:     General: No focal deficit present.     Mental Status: He is alert.    CBC    Component Value Date/Time   WBC 11.6 (H) 10/30/2022 0018   RBC 4.02 (L) 10/30/2022 0018   HGB 10.9 (L) 10/30/2022 0018   HCT 33.8 (L) 10/30/2022 0018   PLT 237 10/30/2022 0018   MCV 84.1 10/30/2022 0018   MCH 27.1 10/30/2022 0018   MCHC 32.2 10/30/2022 0018   RDW 14.4 10/30/2022 0018   LYMPHSABS 3.7 10/27/2022 1040   MONOABS 1.1 (H) 10/27/2022 1040   EOSABS 0.8 (H) 10/27/2022 1040   BASOSABS 0.2 (H) 10/27/2022 1040      Latest Ref Rng & Units 10/30/2022   12:18 AM 10/29/2022   12:39 AM 10/28/2022    4:28 AM  BMP  Glucose 70 - 99 mg/dL 161  096  045   BUN 8 - 23 mg/dL 22  21  18    Creatinine 0.61 - 1.24 mg/dL 4.09  8.11  9.14   Sodium 135 - 145 mmol/L 137  140  137   Potassium 3.5 - 5.1 mmol/L 4.1  4.1  4.4   Chloride 98 - 111 mmol/L 104  107  103   CO2 22 - 32 mmol/L 28  28  25    Calcium 8.9 - 10.3 mg/dL 8.3  8.1  8.8    Chest imaging: CTA Chest 10/27/22 No evidence of pulmonary embolism.   Severe pulmonary emphysema. Increased diffuse ground-glass and new consolidative airspace disease in the lung bases and peripheral posterior aspect of the right upper lobe, concerning  for pneumonia. Trace pleural effusions and trace pericardial effusion. Recommend follow-up chest CT in 3 months.   Unchanged right apical pleuroparenchymal scarring with waxing and waning nodular features dating back to at least June 2020.   Multiple prominent mediastinal lymph nodes, favored to be reactive. Recommend attention on follow-up.  PFT:    Latest Ref Rng & Units 06/26/2020    3:50 PM 04/27/2018    8:48 AM  PFT Results  FVC-Pre L 3.75  4.11   FVC-Predicted Pre % 111  119    FVC-Post L 3.85  4.07   FVC-Predicted Post % 113  118   Pre FEV1/FVC % % 78  79   Post FEV1/FCV % % 78  78   FEV1-Pre L 2.92  3.23   FEV1-Predicted Pre % 122  131   FEV1-Post L 3.02  3.20   DLCO uncorrected ml/min/mmHg 9.44  11.81   DLCO UNC% % 44  44   DLCO corrected ml/min/mmHg 9.61  11.92   DLCO COR %Predicted % 44  44   DLVA Predicted % 45  49   TLC L 5.75  5.24   TLC % Predicted % 92  84   RV % Predicted % 87  51   PFTs 2021 severe diffusion defect  Labs:  Path:  Echo 08/03/22: LV EF 55-60%. Grade I diastolic dysfunction. RV size is mildly enlarged and function is normal. LA is mild to moderately dilated.   Heart Catheterization:  Assessment & Plan:   Chronic respiratory failure with hypoxia and hypercapnia (HCC)  Pulmonary emphysema, unspecified emphysema type (HCC)  Pulmonary hypertension (HCC)  Discussion: Frank Meyer is an 83 year old male, former smoker with COPD/Emphysema, chronic hypoxemic respiratory failure, pulmonary hypertension, and diastolic heart failure who returns to pulmonary clinic for follow up.  End-stage COPD Pulmonary Hypertension Patient is on 6 liters of oxygen at rest and increases to 12-15 liters during exercise. Despite severe disease, patient is able to perform aerobic activities and maintains a good quality of life. Patient is an outlier in terms of physical activity and determination to maintain health. -Continue Anoro one puff daily. -Continue DuoNeb nebulizer solution twice daily. -Use Albuterol inhaler as needed. -Encourage patient to continue physical activity as tolerated, ensuring safety measures such as having a chair available for rest and maintaining a slow, steady pace during activities. -Continue monitoring with palliative care team.  End-of-life planning Patient expressed concerns about future loss of independence and end-of-life decisions. Discussed the role of palliative care and hospice in managing comfort and  quality of life as health deteriorates. -Stay engaged with palliative care team. -Consider hospice care when quality of life significantly declines or if patient becomes unable to care for self. -Patient's preference for hospice center over home for end-of-life care.  Follow-up in 3 months or sooner if any issues arise.  Melody Comas, MD [facility] Office: [phone]    Current Outpatient Medications:    albuterol (VENTOLIN HFA) 108 (90 Base) MCG/ACT inhaler, Inhale 1-2 puffs into the lungs every 4 (four) hours as needed for wheezing or shortness of breath., Disp: , Rfl:    aspirin 81 MG EC tablet, Take 81 mg by mouth daily., Disp: , Rfl:    atorvastatin (LIPITOR) 20 MG tablet, Take 20 mg by mouth every evening., Disp: , Rfl:    cetirizine (ZYRTEC) 5 MG chewable tablet, Chew 10 mg by mouth daily., Disp: , Rfl:    cholecalciferol (VITAMIN D) 1000 UNITS tablet, Take  1,000 Units by mouth daily., Disp: , Rfl:    donepezil (ARICEPT) 10 MG tablet, TAKE 1 TABLET BY MOUTH DAILY, Disp: 60 tablet, Rfl: 5   ENTRESTO 24-26 MG, TAKE 1 TABLET BY MOUTH DAILY, Disp: 100 tablet, Rfl: 2   finasteride (PROSCAR) 5 MG tablet, Take 5 mg by mouth at bedtime., Disp: , Rfl:    furosemide (LASIX) 40 MG tablet, Take 1 tablet (40 mg total) by mouth every other day. (Patient taking differently: Take 40 mg by mouth daily.), Disp: 30 tablet, Rfl: 0   ipratropium (ATROVENT) 0.06 % nasal spray, Place 1-2 sprays into both nostrils See admin instructions. 2 sprays in the right nostril and 1 spray in the left nostril once daily, Disp: , Rfl:    ipratropium-albuterol (DUONEB) 0.5-2.5 (3) MG/3ML SOLN, USE 1 VIAL VIA NEBULIZER TWICE  DAILY (Patient taking differently: Take 3 mLs by nebulization 2 (two) times daily.), Disp: 720 mL, Rfl: 2   levETIRAcetam (KEPPRA) 250 MG tablet, TAKE 1 TABLET BY MOUTH IN THE  MORNING AND 2 TABLETS AT NIGHT, Disp: 300 tablet, Rfl: 2   Melatonin 10 MG TABS, Take 20 mg by  mouth at bedtime., Disp: , Rfl:    omeprazole (PRILOSEC) 40 MG capsule, Take 40 mg by mouth daily. , Disp: , Rfl:    OXYGEN, Inhale 2-5 L into the lungs as directed. 5lt during the day and 2lt at night, Disp: , Rfl:    thiamine 100 MG tablet, Take 1 tablet (100 mg total) by mouth daily., Disp: 30 tablet, Rfl: 0   traZODone (DESYREL) 100 MG tablet, TAKE 2 TABLETS BY MOUTH AT  BEDTIME, Disp: 60 tablet, Rfl: 11   umeclidinium-vilanterol (ANORO ELLIPTA) 62.5-25 MCG/ACT AEPB, Inhale 1 puff into the lungs daily., Disp: 60 each, Rfl: 11   fexofenadine (ALLEGRA) 180 MG tablet, Take 180 mg by mouth daily. (Patient not taking: Reported on 10/14/2023), Disp: , Rfl:    nitroGLYCERIN (NITROSTAT) 0.4 MG SL tablet, Place 1 tablet (0.4 mg total) under the tongue every 5 (five) minutes as needed. Chest pain (Patient taking differently: Place 0.4 mg under the tongue every 5 (five) minutes as needed for chest pain. Chest pain), Disp: 25 tablet, Rfl: 6

## 2023-10-14 NOTE — Patient Instructions (Addendum)
 Continue anoro ellipta 1 puff daily  Continue as needed albuterol  Continue supplemental oxygen  Continue you physical activity regimen  Follow up in 3 months

## 2023-10-18 NOTE — Progress Notes (Signed)
 [facility] - Clinic Note  10/25/2023     CHIEF COMPLAINT Patient presents for Retina Follow Up   HISTORY OF PRESENT ILLNESS: Frank Meyer is a 83 y.o. male who presents to the clinic today for:   HPI     Retina Follow Up   Patient presents with  Wet AMD.  In both eyes.  This started 9 weeks ago.  I, the attending physician,  performed the HPI with the patient and updated documentation appropriately.        Comments   Patient here for 9 weeks retina follow up for exu ARMD OU. Patient states vision doing ok. No eye pain.       Last edited by Rennis Chris, MD on 10/25/2023 12:07 PM.    Pt states he had cataract sx OD, there were no problems afterwards and his vision has improved  Referring physician: Geoffry Paradise, MD [address] [place],  [place] [address]  HISTORICAL INFORMATION:   Selected notes from the MEDICAL RECORD NUMBER [name] pt, transferring care due to insurance LEE: 09.12.23 -- IVA OU Ocular Hx- PMH-    CURRENT MEDICATIONS: No current outpatient medications on file. (Ophthalmic Drugs)   No current facility-administered medications for this visit. (Ophthalmic Drugs)   Current Outpatient Medications (Other)  Medication Sig   albuterol (VENTOLIN HFA) 108 (90 Base) MCG/ACT inhaler Inhale 1-2 puffs into the lungs every 4 (four) hours as needed for wheezing or shortness of breath.   aspirin 81 MG EC tablet Take 81 mg by mouth daily.   atorvastatin (LIPITOR) 20 MG tablet Take 20 mg by mouth every evening.   cetirizine (ZYRTEC) 5 MG chewable tablet Chew 10 mg by mouth daily.   cholecalciferol (VITAMIN D) 1000 UNITS tablet Take 1,000 Units by mouth daily.   donepezil (ARICEPT) 10 MG tablet TAKE 1 TABLET BY MOUTH DAILY   ENTRESTO 24-26 MG TAKE 1 TABLET BY MOUTH DAILY   finasteride (PROSCAR) 5 MG tablet Take 5 mg by mouth at bedtime.   furosemide (LASIX) 40 MG tablet Take 1 tablet (40 mg total) by mouth every other day.  (Patient taking differently: Take 40 mg by mouth daily.)   ipratropium (ATROVENT) 0.06 % nasal spray Place 1-2 sprays into both nostrils See admin instructions. 2 sprays in the right nostril and 1 spray in the left nostril once daily   ipratropium-albuterol (DUONEB) 0.5-2.5 (3) MG/3ML SOLN USE 1 VIAL VIA NEBULIZER TWICE  DAILY (Patient taking differently: Take 3 mLs by nebulization 2 (two) times daily.)   levETIRAcetam (KEPPRA) 250 MG tablet TAKE 1 TABLET BY MOUTH IN THE  MORNING AND 2 TABLETS AT NIGHT   Melatonin 10 MG TABS Take 20 mg by mouth at bedtime.   omeprazole (PRILOSEC) 40 MG capsule Take 40 mg by mouth daily.    OXYGEN Inhale 2-5 L into the lungs as directed. 5lt during the day and 2lt at night   thiamine 100 MG tablet Take 1 tablet (100 mg total) by mouth daily.   traZODone (DESYREL) 100 MG tablet TAKE 2 TABLETS BY MOUTH AT  BEDTIME   umeclidinium-vilanterol (ANORO ELLIPTA) 62.5-25 MCG/ACT AEPB Inhale 1 puff into the lungs daily.   fexofenadine (ALLEGRA) 180 MG tablet Take 180 mg by mouth daily. (Patient not taking: Reported on 10/14/2023)   nitroGLYCERIN (NITROSTAT) 0.4 MG SL tablet Place 1 tablet (0.4 mg total) under the tongue every 5 (five) minutes as needed. Chest pain (Patient taking differently: Place 0.4 mg under  the tongue every 5 (five) minutes as needed for chest pain. Chest pain)   No current facility-administered medications for this visit. (Other)   REVIEW OF SYSTEMS: ROS   Positive for: Cardiovascular, Eyes, Respiratory Negative for: Constitutional, Gastrointestinal, Neurological, Skin, Genitourinary, Musculoskeletal, HENT, Endocrine, Psychiatric, Allergic/Imm, Heme/Lymph Last edited by Laddie Aquas, COA on 10/25/2023 10:04 AM.       ALLERGIES No Known Allergies  PAST MEDICAL HISTORY Past Medical History:  Diagnosis Date   AKI (acute kidney injury) (HCC) 08/03/2022   Allergic rhinitis 10/24/2009   Altered bowel habits 10/04/2020   Basal cell carcinoma     Benign prostatic hyperplasia with lower urinary tract symptoms 10/24/2009   Benign prostatic hypertrophy    Bradycardia 12/06/2019   Callosity 10/16/2013   Cardiomegaly 01/23/2015   Chronic diastolic heart failure (HCC) 12/19/2018   Chronic respiratory failure (HCC) 10/04/2020   Coronary artery calcification seen on CAT scan 05/28/2020   Dyslipidemia    Dyspnea on exertion 03/18/2018   Elevated troponin 08/03/2022   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   Encounter for general adult medical examination without abnormal findings 01/17/2015   Epistaxis 09/18/2016   Ex-cigarette smoker 01/20/2021   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   Gallbladder problem    Gastroesophageal reflux disease    Hiatal hernia    Hilar adenopathy 03/25/2018   History of seizures 08/03/2022   History of smoking 03/18/2018   History of transient ischemic attack (TIA) 10/24/2009   Hyperlipidemia 10/24/2009   Insomnia 05/09/2013   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   Internal hemorrhoid 10/04/2020   Macular degeneration    Macular pucker, right eye 04/30/2020   Mediastinal adenopathy 03/25/2018   Memory loss 05/09/2013   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   Mild memory disturbance    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   Noninfective gastroenteritis and colitis, unspecified 10/04/2020   Nuclear sclerotic cataract of right eye 12/18/2019   Other specified symptoms and signs involving the digestive system and abdomen 10/04/2020   Overweight 09/14/2018   Oxygen dependent 10/04/2020   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   Personal history of colonic polyps 10/04/2020   Posterior vitreous detachment of right eye 12/18/2019   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   Pulmonary hypertension (HCC)    Right upper lobe pulmonary nodule 03/25/2018    Syncope and collapse 12/12/2018   TIA (transient ischemic attack)    Transient ischemic attack 10/24/2009   Vasomotor rhinitis 09/18/2016   Weight decreased 10/04/2020   Past Surgical History:  Procedure Laterality Date   CHOLECYSTECTOMY     none     FAMILY HISTORY Family History  Problem Relation Age of Onset   Obesity Mother    Memory loss Mother    SOCIAL HISTORY Social History   Tobacco Use   Smoking status: Former    Current packs/day: 0.00    Average packs/day: 2.0 packs/day for 25.0 years (50.0 ttl pk-yrs)    Types: Cigarettes    Start date: 71    Quit date: 1994    Years since quitting: 31.2   Smokeless tobacco: Never  Vaping Use   Vaping status: Never Used  Substance Use Topics   Alcohol use: Yes    Alcohol/week: 15.0 - 20.0 standard drinks of alcohol    Types: 15 - 20 Cans of beer per week   Drug use: Never       OPHTHALMIC  EXAM:  Base Eye Exam     Visual Acuity (Snellen - Linear)       Right Left   Dist [place] 20/20 -1 20/30 -2   Dist ph [place]  NI         Tonometry (Tonopen, 10:03 AM)       Right Left   Pressure 06 06         Pupils       Dark Light Shape React APD   Right 3 2 Round Brisk None   Left 3 2 Round Brisk None         Visual Fields (Counting fingers)       Left Right    Full Full         Extraocular Movement       Right Left    Full, Ortho Full, Ortho         Neuro/Psych     Oriented x3: Yes   Mood/Affect: Normal         Dilation     Both eyes: 1.0% Mydriacyl, 2.5% Phenylephrine @ 10:03 AM           Slit Lamp and Fundus Exam     External Exam       Right Left   External Normal Normal         Slit Lamp Exam       Right Left   Lids/Lashes Dermatochalasis - upper lid, Meibomian gland dysfunction Dermatochalasis - upper lid, Meibomian gland dysfunction   Conjunctiva/Sclera White and quiet White and quiet   Cornea arcus, 1-2+ inferior punctate epithelial erosions, well healed cataract  wound arcus, 1-2+ Punctate epithelial erosions, well healed cataract wound   Anterior Chamber deep and clear deep and clear   Iris Round and dilated Round and dilated   Lens PC IOL in good position PC IOL in good position   Anterior Vitreous Vitreous syneresis Vitreous syneresis, silicone oil microbubbles         Fundus Exam       Right Left   Posterior Vitreous Posterior vitreous detachment Posterior vitreous detachment, vitreous condensations   Disc Pink and Sharp Pink and Sharp   C/D Ratio 0.2 0.2   Macula Flat, good foveal reflex, Drusen, RPE mottling and clumping, mild atrophy temporal mac Flat, Blunted foveal reflex, RPE mottling, low PED/CNV with stable improvement in overlying edema, no frank heme   Vessels attenuated, mild tortuosity attenuated, Tortuous   Periphery Attached, mild reticular degeneration, No heme Attached, mild reticular degeneration, No heme, atrophic and pigmented CR scarring temporal periphery           IMAGING AND PROCEDURES  Imaging and Procedures for 10/25/2023  OCT, Retina - OU - Both Eyes       Right Eye Quality was borderline. Central Foveal Thickness: 320. Progression has been stable. Findings include normal foveal contour, no IRF, no SRF, retinal drusen , outer retinal atrophy (Focal ORA temporal macula, no fluid or edema, trace cystic changes vs schisis nasal mac -- slightly improved).   Left Eye Quality was borderline. Central Foveal Thickness: 292. Progression has improved. Findings include no IRF, no SRF, abnormal foveal contour, retinal drusen , subretinal hyper-reflective material, pigment epithelial detachment (Stable improvement in [facility] / edema overlying low PED nasal fovea/macula).   Notes *Images captured and stored on drive  Diagnosis / Impression:  OD: Focal ORA temporal macula; no fluid or edema; trace cystic changes vs schisis nasal mac -- slightly improved OS: stable  improvement in [facility] / edema overlying low PED nasal  fovea/macula  Clinical management:  See below  Abbreviations: NFP - Normal foveal profile. CME - cystoid macular edema. PED - pigment epithelial detachment. IRF - intraretinal fluid. SRF - subretinal fluid. EZ - ellipsoid zone. ERM - epiretinal membrane. ORA - outer retinal atrophy. ORT - outer retinal tubulation. SRHM - subretinal hyper-reflective material. IRHM - intraretinal hyper-reflective material      Intravitreal Injection, Pharmacologic Agent - OS - Left Eye       Time Out 10/25/2023. 11:27 AM. Confirmed correct patient, procedure, site, and patient consented.   Anesthesia Topical anesthesia was used. Anesthetic medications included Lidocaine 2%, Proparacaine 0.5%.   Procedure Preparation included 5% betadine to ocular surface, eyelid speculum. A supplied (32g) needle was used.   Injection: 1.25 mg Bevacizumab 1.25mg /0.38ml   Route: Intravitreal, Site: Left Eye   NDC: [id number], Lot: [id number] , Expiration date: 11/22/2023   Post-op Post injection exam found visual acuity of at least counting fingers. The patient tolerated the procedure well. There were no complications. The patient received written and verbal post procedure care education.            ASSESSMENT/PLAN:    ICD-10-CM   1. 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 Pseudophakia, both eyes  Z96.1      Exudative age related macular degeneration, both eyes  - Dr. Luciana Axe pt here due to insurance reasons - history of multiple IVA OU on q6-8 wk schedule (last IVA OU --11.15.23 - Dr. Luciana Axe)   - s/p IVA OS #1 (01.22.24), #2 (03.27.24), #3 (05.22.24), #4 (07.24.24), #5 (09.18.24), #6 (11.13.24), #7 (01.07.25) **history of increased SRHM/edema and decreased VA OS at 9 wks on 07.24.24**  - BCVA OD  improved to 20/20 from 20/40; OS is stable at 20/30  - OCT shows OD: Focal ORA temporal macula, no fluid or edema trace cystic changes vs schisis nasal mac; OS: stable improvement in [facility] / edema overlying low PED nasal fovea/macula at 8.9 weeks - recommend IVA OS #8 today, 03.10.25 with f/u extended to 10 weeks - recommend holding IVA OD again -- pt in agreement  - pt wishes proceed with injection OS  - RBA of procedure discussed, questions answered - informed consent obtained and signed 01.22.24 (OS) - see procedure note - Eylea approved for 2025 -- but cannot get Good Days due to no funding  - f/u in 10 wks, sooner prn -- DFE/OCT, possible injection(s)  2. Pseudophakia OU  - s/p CE/IOL OU (OD: Dr. Dione Booze, 01.10.25)  - IOLs in good position, doing well  - monitor  Ophthalmic Meds Ordered this visit:  Meds ordered this encounter  Medications   Bevacizumab (AVASTIN) SOLN 1.25 mg     Return in about 10 weeks (around 01/03/2024) for f/u exu ARMD OU, DFE, OCT, Possible Injxn.  There are no Patient Instructions on file for this visit.   Explained the diagnoses, plan, and follow up with the patient and they expressed understanding.  Patient expressed understanding of the importance of proper follow up care.   This document serves as a record of services personally performed by Karie Chimera, MD, PhD. It was created on their behalf by Glee Arvin. Manson Passey, OA an ophthalmic technician. The creation of this  record is the provider's dictation and/or activities during the visit.    Electronically signed by: Glee Arvin. Manson Passey, OA 10/25/23 1:03 PM  Karie Chimera, M.D., Ph.D. Diseases & Surgery of the Retina and Vitreous [facility]  I have reviewed the above documentation for accuracy and completeness, and I agree with the above. Karie Chimera, M.D., Ph.D. 10/25/23 1:05 PM     Abbreviations: M myopia (nearsighted); A astigmatism; H hyperopia (farsighted); P  presbyopia; Mrx spectacle prescription;  CTL contact lenses; OD right eye; OS left eye; OU both eyes  XT exotropia; ET esotropia; PEK punctate epithelial keratitis; PEE punctate epithelial erosions; DES dry eye syndrome; MGD meibomian gland dysfunction; ATs artificial tears; PFAT's preservative free artificial tears; NSC nuclear sclerotic cataract; PSC posterior subcapsular cataract; ERM epi-retinal membrane; PVD posterior vitreous detachment; RD retinal detachment; DM diabetes mellitus; DR diabetic retinopathy; NPDR non-proliferative diabetic retinopathy; PDR proliferative diabetic retinopathy; CSME clinically significant macular edema; DME diabetic macular edema; dbh dot blot hemorrhages; CWS cotton wool spot; POAG primary open angle glaucoma; C/D cup-to-disc ratio; HVF humphrey visual field; GVF goldmann visual field; OCT optical coherence tomography; IOP intraocular pressure; BRVO Branch retinal vein occlusion; CRVO central retinal vein occlusion; CRAO central retinal artery occlusion; BRAO branch retinal artery occlusion; RT retinal tear; SB scleral buckle; PPV pars plana vitrectomy; VH Vitreous hemorrhage; PRP panretinal laser photocoagulation; IVK intravitreal kenalog; VMT vitreomacular traction; MH Macular hole;  NVD neovascularization of the disc; NVE neovascularization elsewhere; AREDS age related eye disease study; ARMD age related macular degeneration; POAG primary open angle glaucoma; EBMD epithelial/anterior basement membrane dystrophy; ACIOL anterior chamber intraocular lens; IOL intraocular lens; PCIOL posterior chamber intraocular lens; Phaco/IOL phacoemulsification with intraocular lens placement; PRK photorefractive keratectomy; LASIK laser assisted in situ keratomileusis; HTN hypertension; DM diabetes mellitus; COPD chronic obstructive pulmonary disease

## 2023-10-22 ENCOUNTER — Other Ambulatory Visit (HOSPITAL_BASED_OUTPATIENT_CLINIC_OR_DEPARTMENT_OTHER): Payer: Self-pay

## 2023-10-25 ENCOUNTER — Encounter (INDEPENDENT_AMBULATORY_CARE_PROVIDER_SITE_OTHER): Payer: Self-pay | Admitting: Ophthalmology

## 2023-10-25 ENCOUNTER — Ambulatory Visit (INDEPENDENT_AMBULATORY_CARE_PROVIDER_SITE_OTHER): Payer: Medicare Other | Admitting: Ophthalmology

## 2023-10-25 DIAGNOSIS — Z961 Presence of intraocular lens: Secondary | ICD-10-CM

## 2023-10-25 DIAGNOSIS — H353231 Exudative age-related macular degeneration, bilateral, with active choroidal neovascularization: Secondary | ICD-10-CM | POA: Diagnosis not present

## 2023-10-25 DIAGNOSIS — H25811 Combined forms of age-related cataract, right eye: Secondary | ICD-10-CM

## 2023-10-25 MED ORDER — BEVACIZUMAB CHEMO INJECTION 1.25MG/0.05ML SYRINGE FOR KALEIDOSCOPE
1.2500 mg | INTRAVITREAL | Status: AC | PRN
Start: 2023-10-25 — End: 2023-10-25
  Administered 2023-10-25: 1.25 mg via INTRAVITREAL

## 2023-11-09 ENCOUNTER — Telehealth: Payer: Self-pay | Admitting: Internal Medicine

## 2023-11-09 NOTE — Telephone Encounter (Signed)
They will fax request

## 2023-11-09 NOTE — Telephone Encounter (Signed)
 [employer] calling saying the Dr's signature is missing from the order for 02.

## 2023-11-09 NOTE — Telephone Encounter (Signed)
 We have not seen any orders since 2024 we need the number to call and find out what and why

## 2023-11-12 ENCOUNTER — Ambulatory Visit
Admission: RE | Admit: 2023-11-12 | Discharge: 2023-11-12 | Disposition: A | Discharge status: Home / Self Care | Source: Ambulatory Visit | Attending: Internal Medicine | Admitting: Internal Medicine

## 2023-11-12 ENCOUNTER — Emergency Department (HOSPITAL_COMMUNITY)

## 2023-11-12 ENCOUNTER — Other Ambulatory Visit: Payer: Self-pay

## 2023-11-12 ENCOUNTER — Emergency Department (HOSPITAL_COMMUNITY)
Admission: EM | Admit: 2023-11-12 | Discharge: 2023-11-12 | Disposition: A | Discharge status: Home / Self Care | Attending: Emergency Medicine | Admitting: Emergency Medicine

## 2023-11-12 ENCOUNTER — Encounter (HOSPITAL_COMMUNITY): Payer: Self-pay | Admitting: Emergency Medicine

## 2023-11-12 ENCOUNTER — Ambulatory Visit: Payer: Self-pay | Admitting: Pulmonary Disease

## 2023-11-12 VITALS — BP 90/58 | HR 96 | Temp 97.7°F | Resp 21

## 2023-11-12 DIAGNOSIS — Z87891 Personal history of nicotine dependence: Secondary | ICD-10-CM | POA: Insufficient documentation

## 2023-11-12 DIAGNOSIS — Z85828 Personal history of other malignant neoplasm of skin: Secondary | ICD-10-CM | POA: Insufficient documentation

## 2023-11-12 DIAGNOSIS — D72829 Elevated white blood cell count, unspecified: Secondary | ICD-10-CM | POA: Insufficient documentation

## 2023-11-12 DIAGNOSIS — Z7982 Long term (current) use of aspirin: Secondary | ICD-10-CM | POA: Insufficient documentation

## 2023-11-12 DIAGNOSIS — J9621 Acute and chronic respiratory failure with hypoxia: Secondary | ICD-10-CM | POA: Diagnosis not present

## 2023-11-12 DIAGNOSIS — I5032 Chronic diastolic (congestive) heart failure: Secondary | ICD-10-CM | POA: Diagnosis not present

## 2023-11-12 DIAGNOSIS — Z8673 Personal history of transient ischemic attack (TIA), and cerebral infarction without residual deficits: Secondary | ICD-10-CM | POA: Diagnosis not present

## 2023-11-12 DIAGNOSIS — I959 Hypotension, unspecified: Secondary | ICD-10-CM | POA: Diagnosis not present

## 2023-11-12 DIAGNOSIS — R0602 Shortness of breath: Secondary | ICD-10-CM

## 2023-11-12 DIAGNOSIS — J441 Chronic obstructive pulmonary disease with (acute) exacerbation: Secondary | ICD-10-CM | POA: Diagnosis not present

## 2023-11-12 LAB — COMPREHENSIVE METABOLIC PANEL WITH GFR
ALT: 13 U/L (ref 0–44)
AST: 16 U/L (ref 15–41)
Albumin: 3.1 g/dL — ABNORMAL LOW (ref 3.5–5.0)
Alkaline Phosphatase: 48 U/L (ref 38–126)
Anion gap: 5 (ref 5–15)
BUN: 16 mg/dL (ref 8–23)
CO2: 33 mmol/L — ABNORMAL HIGH (ref 22–32)
Calcium: 8.5 mg/dL — ABNORMAL LOW (ref 8.9–10.3)
Chloride: 100 mmol/L (ref 98–111)
Creatinine, Ser: 1.38 mg/dL — ABNORMAL HIGH (ref 0.61–1.24)
GFR, Estimated: 51 mL/min — ABNORMAL LOW (ref >60)
Glucose, Bld: 111 mg/dL — ABNORMAL HIGH (ref 70–99)
Potassium: 3.9 mmol/L (ref 3.5–5.1)
Sodium: 138 mmol/L (ref 135–145)
Total Bilirubin: 1.4 mg/dL — ABNORMAL HIGH (ref 0.0–1.2)
Total Protein: 5.9 g/dL — ABNORMAL LOW (ref 6.5–8.1)

## 2023-11-12 LAB — CBC WITH DIFFERENTIAL/PLATELET
Abs Immature Granulocytes: 0.07 10*3/uL (ref 0.00–0.07)
Basophils Absolute: 0.1 10*3/uL (ref 0.0–0.1)
Basophils Relative: 1 %
Eosinophils Absolute: 0.2 10*3/uL (ref 0.0–0.5)
Eosinophils Relative: 1 %
HCT: 38.4 % — ABNORMAL LOW (ref 39.0–52.0)
Hemoglobin: 12.1 g/dL — ABNORMAL LOW (ref 13.0–17.0)
Immature Granulocytes: 1 %
Lymphocytes Relative: 13 %
Lymphs Abs: 1.7 10*3/uL (ref 0.7–4.0)
MCH: 26.9 pg (ref 26.0–34.0)
MCHC: 31.5 g/dL (ref 30.0–36.0)
MCV: 85.5 fL (ref 80.0–100.0)
Monocytes Absolute: 1 10*3/uL (ref 0.1–1.0)
Monocytes Relative: 7 %
Neutro Abs: 10.5 10*3/uL — ABNORMAL HIGH (ref 1.7–7.7)
Neutrophils Relative %: 77 %
Platelets: 196 10*3/uL (ref 150–400)
RBC: 4.49 MIL/uL (ref 4.22–5.81)
RDW: 14.6 % (ref 11.5–15.5)
WBC: 13.4 10*3/uL — ABNORMAL HIGH (ref 4.0–10.5)
nRBC: 0 % (ref 0.0–0.2)

## 2023-11-12 LAB — I-STAT VENOUS BLOOD GAS, ED
Acid-Base Excess: 5 mmol/L — ABNORMAL HIGH (ref 0.0–2.0)
Bicarbonate: 29 mmol/L — ABNORMAL HIGH (ref 20.0–28.0)
Calcium, Ion: 1.04 mmol/L — ABNORMAL LOW (ref 1.15–1.40)
HCT: 36 % — ABNORMAL LOW (ref 39.0–52.0)
Hemoglobin: 12.2 g/dL — ABNORMAL LOW (ref 13.0–17.0)
O2 Saturation: 62 %
Potassium: 3.9 mmol/L (ref 3.5–5.1)
Sodium: 137 mmol/L (ref 135–145)
TCO2: 30 mmol/L (ref 22–32)
pCO2, Ven: 40.1 mmHg — ABNORMAL LOW (ref 44–60)
pH, Ven: 7.467 — ABNORMAL HIGH (ref 7.25–7.43)
pO2, Ven: 30 mmHg — CL (ref 32–45)

## 2023-11-12 MED ORDER — PREDNISONE 50 MG PO TABS: 50.0000 mg | ORAL_TABLET | Freq: Every day | ORAL | 0 refills | Status: AC

## 2023-11-12 MED ORDER — SODIUM CHLORIDE 0.9 % IV BOLUS
750.0000 mL | Freq: Once | INTRAVENOUS | Status: AC
  Administered 2023-11-12: 750 mL via INTRAVENOUS

## 2023-11-12 MED ORDER — DOXYCYCLINE HYCLATE 100 MG PO CAPS: 100.0000 mg | ORAL_CAPSULE | Freq: Two times a day (BID) | ORAL | 0 refills | Status: AC

## 2023-11-12 MED ORDER — IPRATROPIUM-ALBUTEROL 0.5-2.5 (3) MG/3ML IN SOLN
3.0000 mL | Freq: Once | RESPIRATORY_TRACT | Status: AC
  Administered 2023-11-12: 3 mL via RESPIRATORY_TRACT
  Filled 2023-11-12: qty 3

## 2023-11-12 NOTE — Telephone Encounter (Signed)
 Chief Complaint: hypoxia Symptoms: low O2 Frequency: this AM Pertinent Negatives: Patient denies fever, URI sx, CP Disposition: [] ED /[x] Urgent Care (no appt availability in office) / [] Appointment(In office/virtual)/ []  [place] [facility]/ [] Home Care/ [] Refused Recommended Disposition /[]  Mobile Bus/ []  Follow-up with PCP Additional Notes: Pt wife, Pam calling c/o hypoxia noted this AM. Wife reports that his pulse oximeter dropped to ~77% while ambulating in the kitchen and wife was able to recover pt by increasing O2 to 10L. Current readings below. Endorses taking all respiratory tx as prescribed, as well as albuterol. Triager reinforced albuterol usage. Pt denies any fever, URI sx, CP. Wife reports that this has happened before, but last time required hospitalization d/t not being able to recover with supplemental O2. She is requesting CXR-- triager unable to schedule with Pulm d/t access. Triager then scheduled with [facility]. Of note, wife is very anxious and triager could hear pt in background answering questions. Caregiver verbalized understanding and to call back/go to ED with worsening symptoms.    Copied from CRM [id number]. Topic: Clinical - Red Word Triage >> Nov 12, 2023  9:04 AM Renie Ora wrote: Red Word that prompted transfer to Nurse Triage: issues with keeping oxygen numbers up. Patient wife Frank Meyer called stating patient is having an issue this morning with his oxygen and was informed she could request an xray at anytime for the patient. Reason for Disposition  [1] MODERATE difficulty breathing (e.g., speaks in phrases, SOB even at rest, pulse 100 - 120) AND [2] NOT new-onset or worse than normal  Answer Assessment - Initial Assessment Questions E2C2 Pulmonary Triage - Initial Assessment Questions "Chief Complaint (e.g., cough, sob, wheezing, fever, chills, sweat or additional symptoms) *Go to specific symptom protocol after initial questions. hypoxia  "How long have  symptoms been present?" This AM  Have you tested for COVID or Flu? Note: If not, ask patient if a home test can be taken. If so, instruct patient to call back for positive results. No, denies URI sx  MEDICINES:     "Have you used your inhalers/maintenance medication?" Yes If yes, "What medications?" Anoro - 1 puff daily Albuterol - 1-2 puffs once daily - last given 15 mins ago  If inhaler, ask "How many puffs and how often?" Note: Review instructions on medication in the chart. See above  OXYGEN: "Do you wear supplemental oxygen?" Yes If yes, "How many liters are you supposed to use?" 5-6L at rest 8-10L with exertion Increased to 10L with exertion  "Do you monitor your oxygen levels?" Yes If yes, "What is your reading (oxygen level) today?" 77% with exertion this AM --  Currently - 90-91% 8L - pt reports going up  "What is your usual oxygen saturation reading?"  (Note: Pulmonary O2 sats should be 90% or greater) At rest 95    1. MAIN CONCERN OR SYMPTOM: "What's your main concern?" (e.g., low oxygen level, breathing difficulty) "What question do you have?"     hypoxia  4.  OXYGEN EQUIPMENT:  "Are you having any trouble with your oxygen equipment?"  (e.g., cannula, mask, tubing, tank, concentrator)     Denies any trouble with equipment 7. VSS MONITORING: "Do you monitor/measure your oxygen level or vital signs?" (e.g., yes, no, measurements are automatically sent to provider/call center). Document CURRENT and NORMAL BASELINE values if available.     -  P: "What is your pulse rate per minute?"   -  RR: "What is your respiratory rate per minute?"  HR 86 T 99.9 8. BREATHING DIFFICULTY: "Are you having any difficulty breathing?" If Yes, ask: "How bad is it?"  (e.g., none, mild, moderate, severe)    - MILD: No SOB at rest, mild SOB with walking, speaks normally in sentences, able to lie down, no retractions, pulse < 100.    - MODERATE: SOB at rest, SOB with minimal exertion  and prefers to sit, cannot lie down flat, speaks in phrases, mild retractions, audible wheezing, pulse 100-120.    - SEVERE: Very SOB at rest, speaks in single words, struggling to breathe, sitting hunched forward, retractions, pulse > 120      Moderate - severe 9. OTHER SYMPTOMS: "Do you have any other symptoms?" (e.g., fever, change in sputum)     Runny nose - wife reports always running  Protocols used: Oxygen Monitoring and Hypoxia-A-AH

## 2023-11-12 NOTE — Telephone Encounter (Signed)
 FYI please advise Dr. Francine Graven pt going to UC. Nothing is needed

## 2023-11-12 NOTE — Discharge Instructions (Addendum)
 We evaluated you for your shortness of breath.  Your symptoms improved in the emergency department in urgent care after receiving breathing treatments.  We believe you may have a mild COPD exacerbation.  We have prescribed you steroids and antibiotics.  Please follow-up with your pulmonologist and your primary care doctor.  If you have any worsening symptoms such as increasing shortness of breath, worsening oxygen, fevers or chills, lightheadedness or dizziness, chest pain, leg swelling, or any other new symptoms, please return to the emergency department.

## 2023-11-12 NOTE — ED Triage Notes (Signed)
 Arrived from urgent care after increased SHOB, SPO2 at home was 60's, placed on 10L at urgent care. Pt is on 6L at [facility]

## 2023-11-12 NOTE — ED Notes (Signed)
 Patient is being discharged from the Urgent Care and sent to the Emergency Department via EMS . Per Reita May, FNP, patient is in need of higher level of care due to Low 02, hypotension. Patient is aware and verbalizes understanding of plan of care.  Vitals:   11/12/23 1039 11/12/23 1040  BP: (!) 81/56 (!) 90/58  Pulse: 96   Resp: (!) 21   Temp: 97.7 F (36.5 C)   SpO2: 92%

## 2023-11-12 NOTE — ED Provider Notes (Signed)
 [place] [facility] AT [facility] Provider Note  CSN: [id number] Arrival date & time: 11/12/23 1143  Chief Complaint(s) Shortness of Breath  HPI Frank Meyer is a 83 y.o. male history of COPD, CHF, seizure disorder presenting with shortness of breath.  Patient reports that he woke up this morning with increased shortness of breath, had trouble getting his oxygen increased on his chronic 6 L of home oxygen.  Reports some chronic green sputum production.  Received a breathing treatment at urgent care, reports feeling much better subsequently.  He reports that he normally does a nebulizer in the morning but did not do it this morning.  No fevers or chills, lightheadedness or dizziness, weakness, chest pain, abdominal pain, nausea, vomiting, or any other new symptoms.   Past Medical History Past Medical History:  Diagnosis Date   AKI (acute kidney injury) (HCC) 08/03/2022   Allergic rhinitis 10/24/2009   Altered bowel habits 10/04/2020   Basal cell carcinoma    Benign prostatic hyperplasia with lower urinary tract symptoms 10/24/2009   Benign prostatic hypertrophy    Bradycardia 12/06/2019   Callosity 10/16/2013   Cardiomegaly 01/23/2015   Chronic diastolic heart failure (HCC) 12/19/2018   Chronic respiratory failure (HCC) 10/04/2020   Coronary artery calcification seen on CAT scan 05/28/2020   Dyslipidemia    Dyspnea on exertion 03/18/2018   Elevated troponin 08/03/2022   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   Encounter for general adult medical examination without abnormal findings 01/17/2015   Epistaxis 09/18/2016   Ex-cigarette smoker 01/20/2021   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   Gallbladder problem    Gastroesophageal reflux disease    Hiatal hernia    Hilar adenopathy 03/25/2018   History of seizures 08/03/2022   History of smoking 03/18/2018   History of transient  ischemic attack (TIA) 10/24/2009   Hyperlipidemia 10/24/2009   Insomnia 05/09/2013   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   Internal hemorrhoid 10/04/2020   Macular degeneration    Macular pucker, right eye 04/30/2020   Mediastinal adenopathy 03/25/2018   Memory loss 05/09/2013   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   Mild memory disturbance    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   Noninfective gastroenteritis and colitis, unspecified 10/04/2020   Nuclear sclerotic cataract of right eye 12/18/2019   Other specified symptoms and signs involving the digestive system and abdomen 10/04/2020   Overweight 09/14/2018   Oxygen dependent 10/04/2020   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   Personal history of colonic polyps 10/04/2020   Posterior vitreous detachment of right eye 12/18/2019   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   Pulmonary hypertension (HCC)    Right upper lobe pulmonary nodule 03/25/2018   Syncope and collapse 12/12/2018   TIA (transient ischemic attack)    Transient ischemic attack 10/24/2009   Vasomotor rhinitis 09/18/2016   Weight decreased 10/04/2020   Patient Active Problem List   Diagnosis Date Noted   Multifocal pneumonia 12/04/2022   Anemia 10/29/2022   Mild cognitive impairment of uncertain or unknown etiology 10/28/2022   Chronic obstructive pulmonary disease (HCC) 10/28/2022   Acute on chronic respiratory failure with hypoxia and hypercapnia (HCC) 10/27/2022   Community acquired bacterial pneumonia 08/03/2022   Dysphagia 07/21/2022   GERD (gastroesophageal reflux disease) 07/21/2022   COPD exacerbation (HCC) 07/20/2022   Seizures (HCC) 02/03/2022   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HCC)  06/24/2021   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   Chronic respiratory failure  with hypoxia and hypercapnia (HCC) 10/04/2020   Oxygen dependent 10/04/2020   Weight decreased 10/04/2020   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   Pulmonary hypertension (HCC)    Macular pucker, right eye 04/30/2020   Exudative age-related macular degeneration, left eye, with active choroidal neovascularization (HCC) 12/18/2019   Nuclear sclerotic cataract of right eye 12/18/2019   Posterior vitreous detachment of right eye 12/18/2019   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   Chronic diastolic CHF (congestive heart failure) (HCC) 12/19/2018   Mediastinal adenopathy 03/25/2018   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 Authorizing Provider  albuterol (VENTOLIN HFA) 108 (90 Base) MCG/ACT inhaler Inhale 1-2 puffs into the lungs every 4 (four) hours as needed for wheezing or shortness of breath.   Yes [provider]  aspirin 81 MG EC tablet Take 81 mg by mouth daily.   Yes [provider]  atorvastatin (LIPITOR) 20 MG tablet Take 20 mg by mouth every evening.   Yes [provider]  cetirizine (ZYRTEC) 10 MG tablet Take 10 mg by mouth daily.   Yes [provider]  cholecalciferol (VITAMIN D) 1000 UNITS tablet Take 1,000 Units by mouth daily.   Yes [provider]  donepezil (ARICEPT) 10 MG tablet TAKE 1 TABLET BY MOUTH DAILY 08/30/23  Yes Camara, Amalia Hailey, MD  doxycycline (VIBRAMYCIN) 100 MG capsule Take 1 capsule (100 mg total) by mouth 2 (two) times daily for 7 days. 11/12/23 11/19/23 Yes Lonell Grandchild, MD  ENTRESTO 24-26  MG TAKE 1 TABLET BY MOUTH DAILY 06/18/23  Yes Georgeanna Lea, MD  finasteride (PROSCAR) 5 MG tablet Take 5 mg by mouth at bedtime.   Yes [provider]  furosemide (LASIX) 40 MG tablet Take 1 tablet (40 mg total) by mouth every other day. Patient taking differently: Take 40 mg by mouth daily. 10/31/22  Yes Paige, Turkey J, DO  ipratropium (ATROVENT) 0.06 % nasal spray Place 1-2 sprays into both nostrils See admin instructions. 2 sprays in the right nostril and 1 spray in the left nostril once daily 02/09/13  Yes [provider]  ipratropium-albuterol (DUONEB) 0.5-2.5 (3) MG/3ML SOLN USE 1 VIAL VIA NEBULIZER TWICE  DAILY Patient taking differently: Take 3 mLs by nebulization 2 (two) times daily. 04/28/23  Yes Hunsucker, Lesia Sago, MD  levETIRAcetam (KEPPRA) 250 MG tablet TAKE 1 TABLET BY MOUTH IN THE  MORNING AND 2 TABLETS AT NIGHT Patient taking differently: Take 250-500 mg by mouth See admin instructions. Take one tablet by mouth in the morning and take two tablets by mouth in the evening daily. 10/06/23  Yes Windell Norfolk, MD  Melatonin 10 MG TABS Take 20 mg by mouth at bedtime.   Yes [provider]  nitroGLYCERIN (NITROSTAT) 0.4 MG SL tablet Place 1 tablet (0.4 mg total) under the tongue every 5 (five) minutes as needed. Chest pain Patient taking differently: Place 0.4 mg under the tongue every 5 (five) minutes as needed for chest pain. Chest pain 07/15/21 11/12/23 Yes Georgeanna Lea,  MD  omeprazole (PRILOSEC) 40 MG capsule Take 40 mg by mouth daily.  03/29/13  Yes [provider]  OXYGEN Inhale 2-5 L into the lungs as directed. 5lt during the day and 2lt at night   Yes [provider]  predniSONE (DELTASONE) 50 MG tablet Take 1 tablet (50 mg total) by mouth daily for 5 days. 11/12/23 11/17/23 Yes Lonell Grandchild, MD  thiamine 100 MG tablet Take 1 tablet (100 mg total) by mouth daily. 12/13/18  Yes Vann, Jessica U, DO  traZODone (DESYREL) 100 MG  tablet TAKE 2 TABLETS BY MOUTH AT  BEDTIME 08/02/23  Yes Camara, Amadou, MD  umeclidinium-vilanterol (ANORO ELLIPTA) 62.5-25 MCG/ACT AEPB Inhale 1 puff into the lungs daily. 02/24/23  Yes Hunsucker, Lesia Sago, MD                                                                                                                                    Past Surgical History Past Surgical History:  Procedure Laterality Date   CHOLECYSTECTOMY     none     Family History Family History  Problem Relation Age of Onset   Obesity Mother    Memory loss Mother     Social History Social History   Tobacco Use   Smoking status: Former    Current packs/day: 0.00    Average packs/day: 2.0 packs/day for 25.0 years (50.0 ttl pk-yrs)    Types: Cigarettes    Start date: 27    Quit date: 1994    Years since quitting: 31.2   Smokeless tobacco: Never  Vaping Use   Vaping status: Never Used  Substance Use Topics   Alcohol use: Yes    Alcohol/week: 15.0 - 20.0 standard drinks of alcohol    Types: 15 - 20 Cans of beer per week   Drug use: Never   Allergies Patient has no known allergies.  Review of Systems Review of Systems  All other systems reviewed and are negative.   Physical Exam Vital Signs  I have reviewed the triage vital signs BP 108/60   Pulse 67   Temp 97.8 F (36.6 C) (Oral)   Resp (!) 22   Ht 5\' 6"  (1.676 m)   Wt 73.4 kg   SpO2 100%   BMI 26.12 kg/m  Physical Exam Vitals and nursing note reviewed.  Constitutional:      General: He is not in acute distress.    Appearance: Normal appearance.  HENT:     Mouth/Throat:     Mouth: Mucous membranes are moist.  Eyes:     Conjunctiva/sclera: Conjunctivae normal.  Cardiovascular:     Rate and Rhythm: Normal rate and regular rhythm.  Pulmonary:     Effort: Pulmonary effort is normal. No respiratory distress.     Breath sounds: Wheezing (trace diffuse) present.  Abdominal:     General: Abdomen is flat.     Palpations: Abdomen  is soft.     Tenderness: There is no abdominal tenderness.  Musculoskeletal:     Right lower leg: No edema.     Left lower leg: No edema.  Skin:    General: Skin is warm and dry.     Capillary Refill: Capillary refill takes less than 2 seconds.  Neurological:     Mental Status: He is alert and oriented to person, place, and time. Mental status is at baseline.  Psychiatric:        Mood and Affect: Mood normal.        Behavior: Behavior normal.     ED Results and Treatments Labs (all labs ordered are listed, but only abnormal results are displayed) Labs Reviewed  COMPREHENSIVE METABOLIC PANEL WITH GFR - Abnormal; Notable for the following components:      Result Value   CO2 33 (*)    Glucose, Bld 111 (*)    Creatinine, Ser 1.38 (*)    Calcium 8.5 (*)    Total Protein 5.9 (*)    Albumin 3.1 (*)    Total Bilirubin 1.4 (*)    GFR, Estimated 51 (*)    All other components within normal limits  CBC WITH DIFFERENTIAL/PLATELET - Abnormal; Notable for the following components:   WBC 13.4 (*)    Hemoglobin 12.1 (*)    HCT 38.4 (*)    Neutro Abs 10.5 (*)    All other components within normal limits  I-STAT VENOUS BLOOD GAS, ED - Abnormal; Notable for the following components:   pH, Ven 7.467 (*)    pCO2, Ven 40.1 (*)    pO2, Ven 30 (*)    Bicarbonate 29.0 (*)    Acid-Base Excess 5.0 (*)    Calcium, Ion 1.04 (*)    HCT 36.0 (*)    Hemoglobin 12.2 (*)    All other components within normal limits                                                                                                                          Radiology DG Chest Port 1 View Result Date: 11/12/2023 CLINICAL DATA:  Hypoxia and shortness of breath EXAM: PORTABLE CHEST 1 VIEW COMPARISON:  Chest radiograph dated 12/04/2022 FINDINGS: Normal lung volumes. Right apical and bilateral lower lobe it reticulations. No pleural effusion or pneumothorax. Similar cardiomediastinal silhouette. No acute osseous abnormality.  Left upper quadrant surgical clips. IMPRESSION: Right apical and bilateral lower lobe reticulations, likely scarring. Electronically Signed   By: Agustin Cree M.D.   On: 11/12/2023 14:20    Pertinent labs & imaging results that were available during my care of the patient were reviewed by me and considered in my medical decision making (see MDM for details).  Medications Ordered in ED Medications  sodium chloride 0.9 % bolus 750 mL (750 mLs Intravenous New Bag/Given 11/12/23 1317)  ipratropium-albuterol (DUONEB) 0.5-2.5 (3) MG/3ML nebulizer solution 3 mL (3 mLs Nebulization Given 11/12/23 1330)  Procedures Procedures  (including critical care time)  Medical Decision Making / ED Course   MDM:  83 year old presenting to the emergency department with shortness of breath.  On arrival to the emergency department he reports that shortness of breath is resolved.  He is not hypoxic on his home oxygen.  Vitals otherwise notable for mild hypotension.  Review of chart shows that patient has had some low blood pressure on previous visits.  He reports he feels normal currently.  Suspect mild COPD exacerbation.  Laboratory testing with mild AKI, patient received IV fluids with improvement in blood pressure.  Laboratory test also show mild leukocytosis, patient reports some chronic sputum production, but given COPD exacerbation will cover with antibiotics.  Chest x-ray with no focal pneumonia and no focal findings on his pulmonary examination.  Patient has no lower extremity edema or crackles to suggest CHF exacerbation.  No chest pain to suggest cardiac process.  He is comfortable on his baseline oxygen and request discharge. Will discharge patient to home. All questions answered. Patient comfortable with plan of discharge. Return precautions discussed with patient and specified on  the after visit summary.       Additional history obtained: -Additional history obtained from ems and spouse -External records from outside source obtained and reviewed including: Chart review including previous notes, labs, imaging, consultation notes including UC note    Lab Tests: -I ordered, reviewed, and interpreted labs.   The pertinent results include:   Labs Reviewed  COMPREHENSIVE METABOLIC PANEL WITH GFR - Abnormal; Notable for the following components:      Result Value   CO2 33 (*)    Glucose, Bld 111 (*)    Creatinine, Ser 1.38 (*)    Calcium 8.5 (*)    Total Protein 5.9 (*)    Albumin 3.1 (*)    Total Bilirubin 1.4 (*)    GFR, Estimated 51 (*)    All other components within normal limits  CBC WITH DIFFERENTIAL/PLATELET - Abnormal; Notable for the following components:   WBC 13.4 (*)    Hemoglobin 12.1 (*)    HCT 38.4 (*)    Neutro Abs 10.5 (*)    All other components within normal limits  I-STAT VENOUS BLOOD GAS, ED - Abnormal; Notable for the following components:   pH, Ven 7.467 (*)    pCO2, Ven 40.1 (*)    pO2, Ven 30 (*)    Bicarbonate 29.0 (*)    Acid-Base Excess 5.0 (*)    Calcium, Ion 1.04 (*)    HCT 36.0 (*)    Hemoglobin 12.2 (*)    All other components within normal limits    Notable for mild leukocytosis, mild AKI    Imaging Studies ordered: I ordered imaging studies including CXR On my interpretation imaging demonstrates no acute process I independently visualized and interpreted imaging. I agree with the radiologist interpretation   Medicines ordered and prescription drug management: Meds ordered this encounter  Medications   sodium chloride 0.9 % bolus 750 mL   ipratropium-albuterol (DUONEB) 0.5-2.5 (3) MG/3ML nebulizer solution 3 mL   predniSONE (DELTASONE) 50 MG tablet    Sig: Take 1 tablet (50 mg total) by mouth daily for 5 days.    Dispense:  5 tablet    Refill:  0   doxycycline (VIBRAMYCIN) 100 MG capsule    Sig: Take 1  capsule (100 mg total) by mouth 2 (two) times daily for 7 days.    Dispense:  14 capsule  Refill:  0    -I have reviewed the patients home medicines and have made adjustments as needed    Social Determinants of Health:  Diagnosis or treatment significantly limited by social determinants of health: former smoker   Reevaluation: After the interventions noted above, I reevaluated the patient and found that their symptoms have improved  Co morbidities that complicate the patient evaluation  Past Medical History:  Diagnosis Date   AKI (acute kidney injury) (HCC) 08/03/2022   Allergic rhinitis 10/24/2009   Altered bowel habits 10/04/2020   Basal cell carcinoma    Benign prostatic hyperplasia with lower urinary tract symptoms 10/24/2009   Benign prostatic hypertrophy    Bradycardia 12/06/2019   Callosity 10/16/2013   Cardiomegaly 01/23/2015   Chronic diastolic heart failure (HCC) 12/19/2018   Chronic respiratory failure (HCC) 10/04/2020   Coronary artery calcification seen on CAT scan 05/28/2020   Dyslipidemia    Dyspnea on exertion 03/18/2018   Elevated troponin 08/03/2022   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   Encounter for general adult medical examination without abnormal findings 01/17/2015   Epistaxis 09/18/2016   Ex-cigarette smoker 01/20/2021   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   Gallbladder problem    Gastroesophageal reflux disease    Hiatal hernia    Hilar adenopathy 03/25/2018   History of seizures 08/03/2022   History of smoking 03/18/2018   History of transient ischemic attack (TIA) 10/24/2009   Hyperlipidemia 10/24/2009   Insomnia 05/09/2013   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   Internal hemorrhoid 10/04/2020   Macular degeneration    Macular pucker, right eye 04/30/2020   Mediastinal adenopathy 03/25/2018   Memory loss  05/09/2013   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   Mild memory disturbance    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   Noninfective gastroenteritis and colitis, unspecified 10/04/2020   Nuclear sclerotic cataract of right eye 12/18/2019   Other specified symptoms and signs involving the digestive system and abdomen 10/04/2020   Overweight 09/14/2018   Oxygen dependent 10/04/2020   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    Final Clinical Impression(s) / ED Diagnoses Final diagnoses:  COPD exacerbation (HCC)     This chart was dictated using voice recognition software.  Despite best efforts to proofread,  errors can occur which can change the documentation meaning.    Lonell Grandchild, MD 11/12/23 [phone]

## 2023-11-12 NOTE — ED Provider Notes (Signed)
 [facility]    CSN: [id number] Arrival date & time: 11/12/23  1024      History   Chief Complaint Chief Complaint  Patient presents with   Wheezing    Entered by patient    HPI Frank [facility] is a 83 y.o. male.   Patient with history of chronic respiratory failure, oxygen dependence, end stage COPD, and CHF presents to urgent care for evaluation of acute shortness of breath and hypoxia that started yesterday. Patient uses 5-6L nasal cannula O2 at rest and 8-10L [place] O2 with oxygen saturations in the low 90s at baseline per patient and pulmonology notes. This morning he woke up and required 10L nasal cannula O2 with oxygen saturation around 86%. He increased oxygen to 15L while at rest and remains short of breath with oxygen saturation around 90%. Temperature this morning was 99.9. Sputum is green/yellow. He states he "feels fine", however wife at bedside reports "he always says this even if he's symptomatic". Denies recent leg swelling, orthopnea, dizziness, headache, vision changes, falls, urinary symptoms, nausea, vomiting, and abdominal pain. He has not had his breathing treatment this morning.  BP in triage 81/50s. Manual BP right arm sitting 90/58.   Wheezing   Past Medical History:  Diagnosis Date   AKI (acute kidney injury) (HCC) 08/03/2022   Allergic rhinitis 10/24/2009   Altered bowel habits 10/04/2020   Basal cell carcinoma    Benign prostatic hyperplasia with lower urinary tract symptoms 10/24/2009   Benign prostatic hypertrophy    Bradycardia 12/06/2019   Callosity 10/16/2013   Cardiomegaly 01/23/2015   Chronic diastolic heart failure (HCC) 12/19/2018   Chronic respiratory failure (HCC) 10/04/2020   Coronary artery calcification seen on CAT scan 05/28/2020   Dyslipidemia    Dyspnea on exertion 03/18/2018   Elevated troponin 08/03/2022   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   Encounter for general adult medical examination without abnormal findings  01/17/2015   Epistaxis 09/18/2016   Ex-cigarette smoker 01/20/2021   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   Gallbladder problem    Gastroesophageal reflux disease    Hiatal hernia    Hilar adenopathy 03/25/2018   History of seizures 08/03/2022   History of smoking 03/18/2018   History of transient ischemic attack (TIA) 10/24/2009   Hyperlipidemia 10/24/2009   Insomnia 05/09/2013   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   Internal hemorrhoid 10/04/2020   Macular degeneration    Macular pucker, right eye 04/30/2020   Mediastinal adenopathy 03/25/2018   Memory loss 05/09/2013   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   Mild memory disturbance    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   Noninfective gastroenteritis and colitis, unspecified 10/04/2020   Nuclear sclerotic cataract of right eye 12/18/2019   Other specified symptoms and signs involving the digestive system and abdomen 10/04/2020   Overweight 09/14/2018   Oxygen dependent 10/04/2020   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   Personal history of colonic polyps 10/04/2020   Posterior vitreous detachment of right eye 12/18/2019   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   Pulmonary hypertension (HCC)    Right upper lobe pulmonary nodule 03/25/2018   Syncope and collapse 12/12/2018   TIA (transient ischemic attack)    Transient ischemic attack 10/24/2009   Vasomotor rhinitis 09/18/2016   Weight decreased 10/04/2020    Patient Active Problem List   Diagnosis Date Noted   Multifocal pneumonia 12/04/2022   Anemia 10/29/2022  Mild cognitive impairment of uncertain or unknown etiology 10/28/2022   Chronic obstructive pulmonary disease (HCC) 10/28/2022   Acute on chronic respiratory failure with hypoxia and hypercapnia (HCC) 10/27/2022   Community acquired bacterial  pneumonia 08/03/2022   Dysphagia 07/21/2022   GERD (gastroesophageal reflux disease) 07/21/2022   COPD exacerbation (HCC) 07/20/2022   Seizures (HCC) 02/03/2022   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HCC) 06/24/2021   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   Chronic respiratory failure with hypoxia and hypercapnia (HCC) 10/04/2020   Oxygen dependent 10/04/2020   Weight decreased 10/04/2020   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   Pulmonary hypertension (HCC)    Macular pucker, right eye 04/30/2020   Exudative age-related macular degeneration, left eye, with active choroidal neovascularization (HCC) 12/18/2019   Nuclear sclerotic cataract of right eye 12/18/2019   Posterior vitreous detachment of right eye 12/18/2019   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   Chronic diastolic CHF (congestive heart failure) (HCC) 12/19/2018   Mediastinal adenopathy 03/25/2018   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 Authorizing Provider  albuterol (VENTOLIN HFA) 108 (90 Base) MCG/ACT inhaler Inhale 1-2 puffs into the lungs every 4 (four) hours as needed for wheezing or shortness of breath.    [provider]  aspirin 81 MG EC tablet Take 81 mg by mouth daily.    [provider]  atorvastatin (LIPITOR) 20 MG tablet Take 20 mg by mouth every evening.    [provider]  cetirizine (ZYRTEC) 5 MG chewable tablet Chew 10 mg by mouth daily.    [provider]  cholecalciferol (VITAMIN D) 1000 UNITS tablet Take 1,000 Units by mouth daily.    [provider]  donepezil (ARICEPT) 10 MG tablet TAKE 1 TABLET BY MOUTH DAILY 08/30/23   Windell Norfolk, MD  ENTRESTO 24-26 MG TAKE 1 TABLET BY MOUTH DAILY 06/18/23   Georgeanna Lea, MD  fexofenadine (ALLEGRA) 180 MG tablet Take 180 mg by mouth daily. Patient not taking: Reported on 10/14/2023    [provider]  finasteride (PROSCAR) 5 MG tablet Take 5 mg by mouth at bedtime.    [provider]  furosemide (LASIX) 40 MG tablet Take 1 tablet (40 mg total) by mouth every other day. Patient taking differently: Take 40 mg by mouth daily. 10/31/22   Cora Collum, DO  ipratropium (ATROVENT) 0.06 % nasal spray Place 1-2 sprays into both nostrils See admin instructions. 2 sprays in the right nostril and 1 spray in the left nostril once daily 02/09/13   [provider]  ipratropium-albuterol (DUONEB) 0.5-2.5 (3) MG/3ML SOLN USE 1 VIAL VIA NEBULIZER TWICE  DAILY Patient taking differently: Take 3 mLs by nebulization 2 (two) times daily. 04/28/23   Hunsucker, Lesia Sago, MD  levETIRAcetam (KEPPRA) 250 MG tablet TAKE 1 TABLET BY MOUTH IN THE  MORNING AND 2 TABLETS AT NIGHT 10/06/23   Windell Norfolk, MD  Melatonin 10 MG TABS Take 20 mg by mouth at bedtime.    [provider]  nitroGLYCERIN (NITROSTAT) 0.4 MG SL tablet Place 1 tablet (0.4 mg total) under the tongue every 5 (five) minutes as needed. Chest pain Patient taking differently: Place 0.4 mg under the tongue every 5 (five) minutes as needed for chest pain. Chest pain 07/15/21 08/12/23  Georgeanna Lea, MD  omeprazole (PRILOSEC) 40 MG capsule Take 40 mg by mouth daily.  03/29/13   [provider]  OXYGEN Inhale 2-5 L into the lungs as directed. 5lt during the day and 2lt at night    [provider]  thiamine 100 MG tablet Take 1 tablet (100 mg total) by mouth daily. 12/13/18   Joseph Art, DO  traZODone (DESYREL) 100 MG tablet TAKE 2 TABLETS BY MOUTH AT  BEDTIME 08/02/23   Windell Norfolk, MD  umeclidinium-vilanterol (ANORO ELLIPTA) 62.5-25 MCG/ACT AEPB Inhale 1 puff into the lungs daily. 02/24/23   Hunsucker, Lesia Sago, MD    Family History Family History  Problem Relation Age of Onset   Obesity Mother    Memory loss Mother     Social History Social History   Tobacco Use   Smoking status: Former    Current packs/day: 0.00    Average packs/day: 2.0 packs/day for 25.0 years (50.0 ttl pk-yrs)    Types: Cigarettes    Start date: 46    Quit date: 1994    Years since quitting: 31.2   Smokeless tobacco: Never  Vaping Use   Vaping status: Never Used  Substance Use Topics   Alcohol use: Yes    Alcohol/week: 15.0 - 20.0 standard drinks of alcohol    Types: 15 - 20 Cans of beer per week   Drug use: Never     Allergies   Patient has no known allergies.   Review of Systems Review of Systems  Respiratory:  Positive for wheezing.   Per HPI   Physical Exam Triage Vital Signs ED Triage Vitals [11/12/23 1039]  Encounter Vitals Group     BP (!) 81/56     Systolic BP Percentile      Diastolic BP Percentile      Pulse Rate 96     Resp (!) 21     Temp 97.7 F (36.5 C)     Temp Source Oral     SpO2 92 %     Weight      Height      Head Circumference      Peak Flow      Pain Score      Pain Loc      Pain Education      Exclude from Growth Chart    No data found.  Updated Vital Signs BP (!) 90/58 (BP Location: Right Arm)   Pulse 96   Temp 97.7 F (36.5 C) (Oral)   Resp (!) 21   SpO2 92%   Visual Acuity Right Eye Distance:   Left Eye Distance:   Bilateral Distance:    Right Eye Near:   Left Eye Near:    Bilateral Near:     Physical Exam Vitals and nursing note reviewed.  Constitutional:      Appearance: He is ill-appearing. He is not  toxic-appearing.  HENT:     Head: Normocephalic and atraumatic.     Right Ear: Hearing and external ear normal.     Left Ear: Hearing and external ear normal.  Nose: Nose normal.     Mouth/Throat:     Lips: Pink.     Mouth: Mucous membranes are moist.     Pharynx: No posterior oropharyngeal erythema.  Eyes:     General: Lids are normal. Vision grossly intact. Gaze aligned appropriately.     Extraocular Movements: Extraocular movements intact.     Conjunctiva/sclera: Conjunctivae normal.  Cardiovascular:     Rate and Rhythm: Normal rate and regular rhythm.     Heart sounds: Normal heart sounds, S1 normal and S2 normal.  Pulmonary:     Effort: Pulmonary effort is normal. No respiratory distress.     Breath sounds: Normal air entry. Rhonchi and rales present.     Comments: Rales to the bilateral lower lung fields, rhonchi to the upper and lower lung fields bilaterally on inspiration and expiration. Able to speak in full sentences with moderate difficulty.  Musculoskeletal:     Cervical back: Neck supple.     Right lower leg: Edema (trace pitting) present.     Left lower leg: Edema (trace pitting) present.  Skin:    General: Skin is warm and dry.     Capillary Refill: Capillary refill takes less than 2 seconds.     Findings: No rash.  Neurological:     General: No focal deficit present.     Mental Status: He is alert and oriented to person, place, and time. Mental status is at baseline.     Cranial Nerves: No dysarthria or facial asymmetry.  Psychiatric:        Mood and Affect: Mood normal.        Speech: Speech normal.        Behavior: Behavior normal.        Thought Content: Thought content normal.        Judgment: Judgment normal.      UC Treatments / Results  Labs (all labs ordered are listed, but only abnormal results are displayed) Labs Reviewed - No data to display  EKG   Radiology No results found.  Procedures Procedures (including critical care  time)  Medications Ordered in UC Medications - No data to display  Initial Impression / Assessment and Plan / UC Course  I have reviewed the triage vital signs and the nursing notes.  Pertinent labs & imaging results that were available during my care of the patient were reviewed by me and considered in my medical decision making (see chart for details).   1. Acute on chronic respiratory failure with hypoxia, shortness of breath, hypotension Patient with end stage COPD, CHF presenting with acute on chronic hypoxia with new increased oxygen requirement and hypotension. Low grade temp at home 99.9, afebrile orally and via temporal thermometer here.  Borderline tachycardic around low 100s.  Clinical concern for COPD exacerbation, CHF exacerbation, pneumonia, and sepsis.  He meets SIRS criteria. EMS called to transport patient to the nearest ER for further workup and evaluation with patient's and wife's consent. 20 gauge IV placed to R AC. DuoNeb breathing treatment given prior to discharge with some relief in shortness of breath. Oxygen saturation 92% on 10L nasal cannula after duoneb.   Discharged in stable condition with [facility].   Final Clinical Impressions(s) / UC Diagnoses   Final diagnoses:  Acute on chronic respiratory failure with hypoxia (HCC)  Shortness of breath   Discharge Instructions   None    ED Prescriptions   None    PDMP not reviewed this encounter.   Reita May  M, FNP 11/12/23 1112

## 2023-12-23 NOTE — Progress Notes (Shared)
 [facility] - Clinic Note  01/04/2024     CHIEF COMPLAINT Patient presents for Retina Follow Up   HISTORY OF PRESENT ILLNESS: Frank Meyer is a 83 y.o. male who presents to the clinic today for:   HPI     Retina Follow Up   Patient presents with  Wet AMD.  In both eyes.  This started 10 weeks ago.  Duration of 10 weeks.  Since onset it is stable.  I, the attending physician,  performed the HPI with the patient and updated documentation appropriately.        Comments   10 week retina follow up ARMD OU and IVA OS pt is reporting no vision changes noticed he denies any flashes or floaters       Last edited by Ronelle Coffee, MD on 01/04/2024  5:05 PM.     Pt states he had cataract sx OD, there were no problems afterwards and his vision has improved  Referring physician: Suan Elm, MD [address] [place],  [place] [address]  HISTORICAL INFORMATION:   Selected notes from the MEDICAL RECORD NUMBER [name] pt, transferring care due to insurance LEE: 09.12.23 -- IVA OU Ocular Hx- PMH-    CURRENT MEDICATIONS: No current outpatient medications on file. (Ophthalmic Drugs)   No current facility-administered medications for this visit. (Ophthalmic Drugs)   Current Outpatient Medications (Other)  Medication Sig   albuterol  (VENTOLIN  HFA) 108 (90 Base) MCG/ACT inhaler Inhale 1-2 puffs into the lungs every 4 (four) hours as needed for wheezing or shortness of breath.   aspirin  81 MG EC tablet Take 81 mg by mouth daily.   atorvastatin  (LIPITOR) 20 MG tablet Take 20 mg by mouth every evening.   cetirizine (ZYRTEC) 10 MG tablet Take 10 mg by mouth daily.   cholecalciferol  (VITAMIN D ) 1000 UNITS tablet Take 1,000 Units by mouth daily.   donepezil  (ARICEPT ) 10 MG tablet TAKE 1 TABLET BY MOUTH DAILY   ENTRESTO  24-26 MG TAKE 1 TABLET BY MOUTH DAILY   finasteride  (PROSCAR ) 5 MG tablet Take 5 mg by mouth at bedtime.   furosemide  (LASIX ) 40 MG  tablet Take 1 tablet (40 mg total) by mouth every other day. (Patient taking differently: Take 40 mg by mouth daily.)   ipratropium (ATROVENT ) 0.06 % nasal spray Place 1-2 sprays into both nostrils See admin instructions. 2 sprays in the right nostril and 1 spray in the left nostril once daily   ipratropium-albuterol  (DUONEB) 0.5-2.5 (3) MG/3ML SOLN USE 1 VIAL VIA NEBULIZER TWICE  DAILY (Patient taking differently: Take 3 mLs by nebulization 2 (two) times daily.)   levETIRAcetam  (KEPPRA ) 250 MG tablet TAKE 1 TABLET BY MOUTH IN THE  MORNING AND 2 TABLETS AT NIGHT (Patient taking differently: Take 250-500 mg by mouth See admin instructions. Take one tablet by mouth in the morning and take two tablets by mouth in the evening daily.)   Melatonin 10 MG TABS Take 20 mg by mouth at bedtime.   nitroGLYCERIN  (NITROSTAT ) 0.4 MG SL tablet Place 1 tablet (0.4 mg total) under the tongue every 5 (five) minutes as needed. Chest pain (Patient taking differently: Place 0.4 mg under the tongue every 5 (five) minutes as needed for chest pain. Chest pain)   omeprazole (PRILOSEC) 40 MG capsule Take 40 mg by mouth daily.    OXYGEN Inhale 2-5 L into the lungs as directed. 5lt during the day and 2lt at night   thiamine  100 MG tablet Take  1 tablet (100 mg total) by mouth daily.   traZODone  (DESYREL ) 100 MG tablet TAKE 2 TABLETS BY MOUTH AT  BEDTIME   umeclidinium-vilanterol (ANORO ELLIPTA ) 62.5-25 MCG/ACT AEPB Inhale 1 puff into the lungs daily.   No current facility-administered medications for this visit. (Other)   REVIEW OF SYSTEMS: ROS   Positive for: Cardiovascular, Eyes, Respiratory Negative for: Constitutional, Gastrointestinal, Neurological, Skin, Genitourinary, Musculoskeletal, HENT, Endocrine, Psychiatric, Allergic/Imm, Heme/Lymph Last edited by Alise Appl, COT on 01/04/2024  9:53 AM.        ALLERGIES No Known Allergies  PAST MEDICAL HISTORY Past Medical History:  Diagnosis Date   AKI  (acute kidney injury) (HCC) 08/03/2022   Allergic rhinitis 10/24/2009   Altered bowel habits 10/04/2020   Basal cell carcinoma    Benign prostatic hyperplasia with lower urinary tract symptoms 10/24/2009   Benign prostatic hypertrophy    Bradycardia 12/06/2019   Callosity 10/16/2013   Cardiomegaly 01/23/2015   Chronic diastolic heart failure (HCC) 12/19/2018   Chronic respiratory failure (HCC) 10/04/2020   Coronary artery calcification seen on CAT scan 05/28/2020   Dyslipidemia    Dyspnea on exertion 03/18/2018   Elevated troponin 08/03/2022   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   Encounter for general adult medical examination without abnormal findings 01/17/2015   Epistaxis 09/18/2016   Ex-cigarette smoker 01/20/2021   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   Gallbladder problem    Gastroesophageal reflux disease    Hiatal hernia    Hilar adenopathy 03/25/2018   History of seizures 08/03/2022   History of smoking 03/18/2018   History of transient ischemic attack (TIA) 10/24/2009   Hyperlipidemia 10/24/2009   Insomnia 05/09/2013   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   Internal hemorrhoid 10/04/2020   Macular degeneration    Macular pucker, right eye 04/30/2020   Mediastinal adenopathy 03/25/2018   Memory loss 05/09/2013   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   Mild memory disturbance    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   Noninfective gastroenteritis and colitis, unspecified 10/04/2020   Nuclear sclerotic cataract of right eye 12/18/2019   Other specified symptoms and signs involving the digestive system and abdomen 10/04/2020   Overweight 09/14/2018   Oxygen dependent 10/04/2020   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   Personal history of colonic polyps 10/04/2020   Posterior vitreous detachment of right eye 12/18/2019   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   Pulmonary hypertension (HCC)    Right upper lobe pulmonary nodule 03/25/2018   Syncope and collapse 12/12/2018   TIA (transient ischemic attack)    Transient ischemic attack 10/24/2009   Vasomotor rhinitis 09/18/2016   Weight decreased 10/04/2020   Past Surgical History:  Procedure Laterality Date   CHOLECYSTECTOMY     none     FAMILY HISTORY Family History  Problem Relation Age of Onset   Obesity Mother    Memory loss Mother    SOCIAL HISTORY Social History   Tobacco Use   Smoking status: Former    Current packs/day: 0.00    Average packs/day: 2.0 packs/day for 25.0 years (50.0 ttl pk-yrs)    Types: Cigarettes    Start date: 71    Quit date: 1994    Years since quitting: 31.4   Smokeless tobacco: Never  Vaping Use   Vaping status: Never Used  Substance Use Topics   Alcohol use: Yes    Alcohol/week: 15.0 - 20.0 standard  drinks of alcohol    Types: 15 - 20 Cans of beer per week   Drug use: Never       OPHTHALMIC EXAM:  Base Eye Exam     Visual Acuity (Snellen - Linear)       Right Left   Dist [place] 20/25 20/40 -1   Dist ph [place] NI NI         Tonometry (Tonopen, 10:00 AM)       Right Left   Pressure 9 11         Pupils       Pupils Dark Light Shape React APD   Right PERRL 3 2 Round Brisk None   Left PERRL 3 2 Round Brisk None         Visual Fields       Left Right    Full Full         Extraocular Movement       Right Left    Full, Ortho Full, Ortho         Neuro/Psych     Oriented x3: Yes   Mood/Affect: Normal         Dilation     Both eyes: 2.5% Phenylephrine @ 10:00 AM           Slit Lamp and Fundus Exam     External Exam       Right Left   External Normal Normal         Slit Lamp Exam       Right Left   Lids/Lashes Dermatochalasis - upper lid, Meibomian gland dysfunction Dermatochalasis - upper lid, Meibomian gland dysfunction   Conjunctiva/Sclera  White and quiet White and quiet   Cornea arcus, 1-2+ inferior punctate epithelial erosions, well healed cataract wound arcus, 1-2+ Punctate epithelial erosions, well healed cataract wound   Anterior Chamber deep and clear deep and clear   Iris Round and dilated Round and dilated   Lens PC IOL in good position PC IOL in good position   Anterior Vitreous Vitreous syneresis Vitreous syneresis, silicone oil microbubbles         Fundus Exam       Right Left   Posterior Vitreous Posterior vitreous detachment Posterior vitreous detachment, vitreous condensations   Disc Pink and Sharp Pink and Sharp   C/D Ratio 0.2 0.2   Macula Flat, good foveal reflex, Drusen, RPE mottling and clumping, mild atrophy temporal mac Flat, Blunted foveal reflex, RPE mottling, low PED/CNV with stable improvement in overlying edema, no frank heme   Vessels attenuated, mild tortuosity attenuated, Tortuous   Periphery Attached, mild reticular degeneration, No heme Attached, mild reticular degeneration, No heme, atrophic and pigmented CR scarring temporal periphery           IMAGING AND PROCEDURES  Imaging and Procedures for 01/04/2024  OCT, Retina - OU - Both Eyes        Right Eye Quality was borderline. Central Foveal Thickness: 306. Progression has been stable. Findings include normal foveal contour, no SRF, retinal drusen , intraretinal fluid, outer retinal atrophy (Focal ORA temporal macula, no fluid or edema, trace cystic changes vs schisis nasal mac).   Left Eye Quality was borderline. Central Foveal Thickness: 300. Progression has been stable. Findings include no IRF, no SRF, abnormal foveal contour, retinal drusen , subretinal hyper-reflective material, pigment epithelial detachment (Trace, persistent SRHM / edema overlying low PED nasal fovea/macula).   Notes  *Images captured and stored on drive  Diagnosis /  Impression:  OD: Focal ORA temporal macula; no fluid or edema; trace cystic changes vs  schisis nasal mac OS: trace persistent SRHM / edema overlying low PED nasal fovea/macula  Clinical management:  See below  Abbreviations: NFP - Normal foveal profile. CME - cystoid macular edema. PED - pigment epithelial detachment. IRF - intraretinal fluid. SRF - subretinal fluid. EZ - ellipsoid zone. ERM - epiretinal membrane. ORA - outer retinal atrophy. ORT - outer retinal tubulation. SRHM - subretinal hyper-reflective material. IRHM - intraretinal hyper-reflective material      Intravitreal Injection, Pharmacologic Agent - OS - Left Eye       Time Out 01/04/2024. 11:28 AM. Confirmed correct patient, procedure, site, and patient consented.   Anesthesia Topical anesthesia was used. Anesthetic medications included Lidocaine 2%, Proparacaine 0.5%.   Procedure Preparation included 5% betadine to ocular surface, eyelid speculum. A supplied (32g) needle was used.   Injection: 1.25 mg Bevacizumab  1.25mg /0.71ml   Route: Intravitreal, Site: Left Eye   NDC: [id number], Lot: [id number], Expiration date: 02/04/2024   Post-op Post injection exam found visual acuity of at least counting fingers. The patient tolerated the procedure well. There were no complications. The patient received written and verbal post procedure care education.             ASSESSMENT/PLAN:    ICD-10-CM   1. 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 Pseudophakia, both eyes  Z96.1      Exudative age related macular degeneration, both eyes  - Dr. Seward Dao pt here due to insurance reasons - history of multiple IVA OU on q6-8 wk schedule (last IVA OU --11.15.23 - Dr. Seward Dao)   - s/p IVA OS #1 (01.22.24), #2 (03.27.24), #3 (05.22.24), #4 (07.24.24), #5 (09.18.24), #6 (11.13.24), #7 (01.07.25), #8  (03.10.25) **history of increased SRHM/edema and decreased VA OS at 9 wks on 07.24.24**  - BCVA OD decreased to 20/25 from 20/20; OS decreased to 20/40 from 20/30  - OCT shows OD: Focal ORA temporal macula, no fluid or edema trace cystic changes vs schisis nasal mac; OS: tr persistent SRHM / edema overlying low PED nasal fovea/macula at 10 weeks - recommend IVA OS #9 today, 05.20.25 with f/u back to 8-9 weeks - recommend holding IVA OD again -- pt in agreement  - pt wishes proceed with injection OS  - RBA of procedure discussed, questions answered - informed consent obtained and signed 01.22.24 (OS) - see procedure note - Eylea approved for 2025 -- but [employer] funding unavailable  - f/u in 8-9 wks, sooner prn -- DFE/OCT, possible injection(s)  2. Pseudophakia OU  - s/p CE/IOL OU (OD: Dr. Candi Chafe, 01.10.25)  - IOLs in good position, doing well  - monitor  Ophthalmic Meds Ordered this visit:  Meds ordered this encounter  Medications   Bevacizumab  (AVASTIN ) SOLN 1.25 mg     Return for f/u 8-9 weeks, exu ARMD OU, DFE, OCT, Possible Injxn.  There are no Patient Instructions on file for this visit.   Explained the diagnoses, plan, and follow up with the patient and they expressed understanding.  Patient expressed understanding of the importance of proper follow up care.   This document serves as a record of services personally performed by Jeanice Millard, MD, PhD. It was created on  their behalf by Olene Berne, COT an ophthalmic technician. The creation of this record is the provider's dictation and/or activities during the visit.    Electronically signed by:  Olene Berne, COT  01/09/24 1:06 AM  This document serves as a record of services personally performed by Jeanice Millard, MD, PhD. It was created on their behalf by Morley Arabia. Bevin Bucks, OA an ophthalmic technician. The creation of this record is the provider's dictation and/or activities during the visit.     Electronically signed by: Morley Arabia. Bevin Bucks, OA 01/09/24 1:06 AM  Jeanice Millard, M.D., Ph.D. Diseases & Surgery of the Retina and Vitreous [facility]  I have reviewed the above documentation for accuracy and completeness, and I agree with the above. Jeanice Millard, M.D., Ph.D. 01/09/24 1:06 AM   Abbreviations: M myopia (nearsighted); A astigmatism; H hyperopia (farsighted); P presbyopia; Mrx spectacle prescription;  CTL contact lenses; OD right eye; OS left eye; OU both eyes  XT exotropia; ET esotropia; PEK punctate epithelial keratitis; PEE punctate epithelial erosions; DES dry eye syndrome; MGD meibomian gland dysfunction; ATs artificial tears; PFAT's preservative free artificial tears; NSC nuclear sclerotic cataract; PSC posterior subcapsular cataract; ERM epi-retinal membrane; PVD posterior vitreous detachment; RD retinal detachment; DM diabetes mellitus; DR diabetic retinopathy; NPDR non-proliferative diabetic retinopathy; PDR proliferative diabetic retinopathy; CSME clinically significant macular edema; DME diabetic macular edema; dbh dot blot hemorrhages; CWS cotton wool spot; POAG primary open angle glaucoma; C/D cup-to-disc ratio; HVF humphrey visual field; GVF goldmann visual field; OCT optical coherence tomography; IOP intraocular pressure; BRVO Branch retinal vein occlusion; CRVO central retinal vein occlusion; CRAO central retinal artery occlusion; BRAO branch retinal artery occlusion; RT retinal tear; SB scleral buckle; PPV pars plana vitrectomy; VH Vitreous hemorrhage; PRP panretinal laser photocoagulation; IVK intravitreal kenalog; VMT vitreomacular traction; MH Macular hole;  NVD neovascularization of the disc; NVE neovascularization elsewhere; AREDS age related eye disease study; ARMD age related macular degeneration; POAG primary open angle glaucoma; EBMD epithelial/anterior basement membrane dystrophy; ACIOL anterior chamber intraocular lens; IOL intraocular  lens; PCIOL posterior chamber intraocular lens; Phaco/IOL phacoemulsification with intraocular lens placement; PRK photorefractive keratectomy; LASIK laser assisted in situ keratomileusis; HTN hypertension; DM diabetes mellitus; COPD chronic obstructive pulmonary disease

## 2024-01-04 ENCOUNTER — Encounter (INDEPENDENT_AMBULATORY_CARE_PROVIDER_SITE_OTHER): Payer: Self-pay | Admitting: Ophthalmology

## 2024-01-04 ENCOUNTER — Ambulatory Visit (INDEPENDENT_AMBULATORY_CARE_PROVIDER_SITE_OTHER): Admitting: Ophthalmology

## 2024-01-04 DIAGNOSIS — Z961 Presence of intraocular lens: Secondary | ICD-10-CM | POA: Diagnosis not present

## 2024-01-04 DIAGNOSIS — H353231 Exudative age-related macular degeneration, bilateral, with active choroidal neovascularization: Secondary | ICD-10-CM

## 2024-01-04 DIAGNOSIS — H25811 Combined forms of age-related cataract, right eye: Secondary | ICD-10-CM

## 2024-01-04 MED ORDER — BEVACIZUMAB CHEMO INJECTION 1.25MG/0.05ML SYRINGE FOR KALEIDOSCOPE
1.2500 mg | INTRAVITREAL | Status: AC | PRN
  Administered 2024-01-04: 1.25 mg via INTRAVITREAL

## 2024-01-11 ENCOUNTER — Ambulatory Visit: Payer: Medicare Other | Admitting: Pulmonary Disease

## 2024-01-11 ENCOUNTER — Encounter: Payer: Self-pay | Admitting: Pulmonary Disease

## 2024-01-11 VITALS — BP 109/65 | HR 82

## 2024-01-11 DIAGNOSIS — Z87891 Personal history of nicotine dependence: Secondary | ICD-10-CM | POA: Diagnosis not present

## 2024-01-11 DIAGNOSIS — J449 Chronic obstructive pulmonary disease, unspecified: Secondary | ICD-10-CM

## 2024-01-11 DIAGNOSIS — J439 Emphysema, unspecified: Secondary | ICD-10-CM

## 2024-01-11 MED ORDER — IPRATROPIUM-ALBUTEROL 0.5-2.5 (3) MG/3ML IN SOLN: 3.0000 mL | Freq: Four times a day (QID) | RESPIRATORY_TRACT | 11 refills | Status: DC | PRN

## 2024-01-11 MED ORDER — UMECLIDINIUM-VILANTEROL 62.5-25 MCG/ACT IN AEPB: 1.0000 | INHALATION_SPRAY | Freq: Every day | RESPIRATORY_TRACT | 11 refills | Status: DC

## 2024-01-11 NOTE — Patient Instructions (Addendum)
-  Continue Anoro one puff daily. -Continue DuoNeb nebulizer solution twice daily. -Use Albuterol  inhaler as needed. - Continue supplemental oxygen  Follow up in 6 months, call sooner if needed

## 2024-01-11 NOTE — Progress Notes (Signed)
 Synopsis: Referred in April 2024 for acute visit, patient of Dr. Marygrace Snellen with emphysema  Subjective:   PATIENT ID: Frank Meyer GENDER: male DOB: May 23, 1941, MRN: [id number]  HPI  Chief Complaint  Patient presents with   Follow-up    Emphysema / SOB on exertion    Frank Meyer is an 83 year old male, former smoker with COPD/Emphysema, chronic hypoxemic respiratory failure, pulmonary hypertension, and diastolic heart failure who returns to pulmonary clinic for follow up.   Initial visit 11/18/22 He was treated for COPD exacerbation 10/12/22 by his primary care team. He was admitted 1/26 to 1/31 for acute on chronic respiratory failure due to heart failure exacerbation and COPD exacerbation along with multifocal pneumonia. He completed his course of antibiotics and steroid taper.  He went to 4 pulmonary rehab sessions without issues. Yesterday, at pulmonary rehab he had significant increase in his O2 needs and was placed on non-rebreather mask.   He is overall feeling ok. He denies fevers, chills or sweats. Denies increased mucous production. Denies increased edema. He has a blood pressure and daily weight log. His weights have been stable between 166-169lbs and his blood pressure has been stable.    He is alert and oriented time 4. No confusion noted.  Patient walked in hallway on 6L oxygen with oxygen saturations above 92% on first lap (243ft). 50 ft into second lap, patient's oxygen saturations dropped to 79%. He remained asymptomatic. Pleth was strong on monitor. He recovered above 88% within a minute of rest.   OV 07/21/23 The patient, with a history of significant lung disease including pulmonary hypertension and COPD, presents for a routine follow-up. He reports managing his condition with Anoro, a nebulizer treatment twice daily, and an albuterol  inhaler as needed. Despite requiring high levels of supplemental oxygen, the patient remains active and exercises regularly, even  going to the gym. He acknowledges that his oxygen levels can drop into the seventies during activity but recover upon rest. The patient also reports memory issues and a family history of dementia or Alzheimer's, which is a concern for him. He is currently under palliative care and has a DNR order in place. The patient expresses understanding of his condition and appears prepared for potential future acute illnesses.  OV 10/14/23 He uses oxygen at a flow rate of 12-15 liters per minute during exercise and 6 liters per minute while sitting. He stopped smoking at the age of 20.  He maintains physical activity by attending the gym two to three times a week, performing seated exercises due to significant oxygen desaturation with walking. He uses two oxygen bottles during workouts, which last about 20 to 25 minutes each, switching bottles between exercises.  Following a hospital discharge where he was informed that no further interventions could be offered, he believed he had a limited time to live. This led him to contact friends to say goodbye. Despite this, he has remained stable and continues to live independently with his wife. He is involved with palliative care as a precautionary measure, anticipating a transition to hospice care if his condition worsens.  He has concerns about the future, particularly regarding the potential loss of independence and the need for increased oxygen support. He is concerned about the impact of his health on his wife, especially if he becomes bedbound and requires more intensive care. He is contemplating end-of-life decisions and prefers not to pass away at home to avoid burdening his wife. He has considered hospice care for when  he can no longer maintain his independence.  He continues to see his primary care provider's physician assistant monthly and has biannual visits with his primary care doctor. He is aware of the potential for infections, particularly during the winter  months, and has previously been hospitalized for pneumonia.  OV 01/11/24 He experiences chronic shortness of breath and cough. In March, he visited the ER for shortness of breath and received prednisone  and an antibiotic. He does not recall having a cough with green sputum during that visit.  He remains active, working out at [employer] with the aid of oxygen tanks. He uses two bottles for workouts lasting about 25 minutes, but refilling them is time-consuming, taking three to four hours per bottle. He has six bottles in total.  His current treatment includes daily use of an Anoro inhaler and DuoNeb nebulizers twice a day. He maintains his oxygen concentration at five or six, reducing it to two at bedtime. He feels he is gradually slowing down.  Past Medical History:  Diagnosis Date   AKI (acute kidney injury) (HCC) 08/03/2022   Allergic rhinitis 10/24/2009   Altered bowel habits 10/04/2020   Basal cell carcinoma    Benign prostatic hyperplasia with lower urinary tract symptoms 10/24/2009   Benign prostatic hypertrophy    Bradycardia 12/06/2019   Callosity 10/16/2013   Cardiomegaly 01/23/2015   Chronic diastolic heart failure (HCC) 12/19/2018   Chronic respiratory failure (HCC) 10/04/2020   Coronary artery calcification seen on CAT scan 05/28/2020   Dyslipidemia    Dyspnea on exertion 03/18/2018   Elevated troponin 08/03/2022   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   Encounter for general adult medical examination without abnormal findings 01/17/2015   Epistaxis 09/18/2016   Ex-cigarette smoker 01/20/2021   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   Gallbladder problem    Gastroesophageal reflux disease    Hiatal hernia    Hilar adenopathy 03/25/2018   History of seizures 08/03/2022   History of smoking 03/18/2018   History of transient ischemic attack (TIA) 10/24/2009   Hyperlipidemia 10/24/2009   Insomnia  05/09/2013   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   Internal hemorrhoid 10/04/2020   Macular degeneration    Macular pucker, right eye 04/30/2020   Mediastinal adenopathy 03/25/2018   Memory loss 05/09/2013   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   Mild memory disturbance    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   Noninfective gastroenteritis and colitis, unspecified 10/04/2020   Nuclear sclerotic cataract of right eye 12/18/2019   Other specified symptoms and signs involving the digestive system and abdomen 10/04/2020   Overweight 09/14/2018   Oxygen dependent 10/04/2020   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   Personal history of colonic polyps 10/04/2020   Posterior vitreous detachment of right eye 12/18/2019   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   Pulmonary hypertension (HCC)    Right upper lobe pulmonary nodule 03/25/2018   Syncope and collapse 12/12/2018   TIA (transient ischemic attack)    Transient ischemic attack 10/24/2009   Vasomotor rhinitis 09/18/2016   Weight decreased 10/04/2020     Family History  Problem Relation Age of Onset   Obesity Mother    Memory loss Mother      Social History   Socioeconomic History   Marital status: Married    Spouse name: pamela   Number of children: 2   Years of education: college   Highest education level:  Not on file  Occupational History   Occupation: retired  Tobacco Use   Smoking status: Former    Current packs/day: 0.00    Average packs/day: 2.0 packs/day for 25.0 years (50.0 ttl pk-yrs)    Types: Cigarettes    Start date: 70    Quit date: 1994    Years since quitting: 31.4   Smokeless tobacco: Never  Vaping Use   Vaping status: Never Used  Substance and Sexual Activity   Alcohol use: Yes    Alcohol/week: 15.0 - 20.0 standard drinks of alcohol    Types: 15 - 20 Cans of beer per week   Drug use: Never   Sexual activity:  Not Currently  Other Topics Concern   Not on file  Social History Narrative   Pt is retired. He is married. The pt lives with his wife. He has a Chiropodist. The pt has 2 children.      Patient drinks 2-3 cups of caffeine daily.   Patient is right handed.    Social Drivers of Corporate investment banker Strain: Not on file  Food Insecurity: No Food Insecurity (10/28/2022)   Hunger Vital Sign    Worried About Running Out of Food in the Last Year: Never true    Ran Out of Food in the Last Year: Never true  Transportation Needs: No Transportation Needs (10/28/2022)   PRAPARE - Administrator, Civil Service (Medical): No    Lack of Transportation (Non-Medical): No  Physical Activity: Not on file  Stress: Not on file  Social Connections: Not on file  Intimate Partner Violence: Not At Risk (10/28/2022)   Humiliation, Afraid, Rape, and Kick questionnaire    Fear of Current or Ex-Partner: No    Emotionally Abused: No    Physically Abused: No    Sexually Abused: No     No Known Allergies   Outpatient Medications Prior to Visit  Medication Sig Dispense Refill   albuterol  (VENTOLIN  HFA) 108 (90 Base) MCG/ACT inhaler Inhale 1-2 puffs into the lungs every 4 (four) hours as needed for wheezing or shortness of breath.     aspirin  81 MG EC tablet Take 81 mg by mouth daily.     atorvastatin  (LIPITOR) 20 MG tablet Take 20 mg by mouth every evening.     cetirizine (ZYRTEC) 10 MG tablet Take 10 mg by mouth daily.     cholecalciferol  (VITAMIN D ) 1000 UNITS tablet Take 1,000 Units by mouth daily.     donepezil  (ARICEPT ) 10 MG tablet TAKE 1 TABLET BY MOUTH DAILY 60 tablet 5   ENTRESTO  24-26 MG TAKE 1 TABLET BY MOUTH DAILY 100 tablet 2   finasteride  (PROSCAR ) 5 MG tablet Take 5 mg by mouth at bedtime.     furosemide  (LASIX ) 40 MG tablet Take 1 tablet (40 mg total) by mouth every other day. (Patient taking differently: Take 40 mg by mouth daily.) 30 tablet 0   ipratropium (ATROVENT )  0.06 % nasal spray Place 1-2 sprays into both nostrils See admin instructions. 2 sprays in the right nostril and 1 spray in the left nostril once daily     ipratropium-albuterol  (DUONEB) 0.5-2.5 (3) MG/3ML SOLN USE 1 VIAL VIA NEBULIZER TWICE  DAILY (Patient taking differently: Take 3 mLs by nebulization 2 (two) times daily.) 720 mL 2   levETIRAcetam  (KEPPRA ) 250 MG tablet TAKE 1 TABLET BY MOUTH IN THE  MORNING AND 2 TABLETS AT NIGHT (Patient taking differently: Take 250-500 mg by mouth  See admin instructions. Take one tablet by mouth in the morning and take two tablets by mouth in the evening daily.) 300 tablet 2   Melatonin 10 MG TABS Take 20 mg by mouth at bedtime.     omeprazole (PRILOSEC) 40 MG capsule Take 40 mg by mouth daily.      OXYGEN Inhale 2-5 L into the lungs as directed. 5lt during the day and 2lt at night     thiamine  100 MG tablet Take 1 tablet (100 mg total) by mouth daily. 30 tablet 0   traZODone  (DESYREL ) 100 MG tablet TAKE 2 TABLETS BY MOUTH AT  BEDTIME 60 tablet 11   umeclidinium-vilanterol (ANORO ELLIPTA ) 62.5-25 MCG/ACT AEPB Inhale 1 puff into the lungs daily. 60 each 11   nitroGLYCERIN  (NITROSTAT ) 0.4 MG SL tablet Place 1 tablet (0.4 mg total) under the tongue every 5 (five) minutes as needed. Chest pain (Patient taking differently: Place 0.4 mg under the tongue every 5 (five) minutes as needed for chest pain. Chest pain) 25 tablet 6   No facility-administered medications prior to visit.   Review of Systems  Constitutional:  Negative for chills, fever, malaise/fatigue and weight loss.  HENT:  Negative for congestion, sinus pain and sore throat.   Eyes: Negative.   Respiratory:  Positive for cough and shortness of breath. Negative for hemoptysis, sputum production and wheezing.   Cardiovascular:  Negative for chest pain, palpitations, orthopnea, claudication and leg swelling.  Gastrointestinal:  Negative for abdominal pain, heartburn, nausea and vomiting.  Genitourinary:  Negative.   Musculoskeletal:  Negative for joint pain and myalgias.  Skin:  Negative for rash.  Neurological:  Negative for weakness.  Endo/Heme/Allergies: Negative.   Psychiatric/Behavioral: Negative.     Objective:   Vitals:   01/11/24 1402  BP: 109/65  Pulse: 82  SpO2: 98%    Physical Exam Constitutional:      General: He is not in acute distress. HENT:     Head: Normocephalic and atraumatic.  Eyes:     Conjunctiva/sclera: Conjunctivae normal.  Cardiovascular:     Rate and Rhythm: Normal rate and regular rhythm.     Pulses: Normal pulses.     Heart sounds: Normal heart sounds. No murmur heard. Pulmonary:     Effort: Pulmonary effort is normal.     Breath sounds: Rales (mild scattered) present. No wheezing.  Musculoskeletal:     Right lower leg: No edema.     Left lower leg: No edema.  Skin:    General: Skin is warm and dry.  Neurological:     General: No focal deficit present.     Mental Status: He is alert.    CBC    Component Value Date/Time   WBC 13.4 (H) 11/12/2023 1305   RBC 4.49 11/12/2023 1305   HGB 12.2 (L) 11/12/2023 1310   HCT 36.0 (L) 11/12/2023 1310   PLT 196 11/12/2023 1305   MCV 85.5 11/12/2023 1305   MCH 26.9 11/12/2023 1305   MCHC 31.5 11/12/2023 1305   RDW 14.6 11/12/2023 1305   LYMPHSABS 1.7 11/12/2023 1305   MONOABS 1.0 11/12/2023 1305   EOSABS 0.2 11/12/2023 1305   BASOSABS 0.1 11/12/2023 1305      Latest Ref Rng & Units 11/12/2023    1:10 PM 11/12/2023    1:05 PM 10/30/2022   12:18 AM  BMP  Glucose 70 - 99 mg/dL  161  096   BUN 8 - 23 mg/dL  16  22   Creatinine  0.61 - 1.24 mg/dL  6.57  8.46   Sodium 962 - 145 mmol/L 137  138  137   Potassium 3.5 - 5.1 mmol/L 3.9  3.9  4.1   Chloride 98 - 111 mmol/L  100  104   CO2 22 - 32 mmol/L  33  28   Calcium  8.9 - 10.3 mg/dL  8.5  8.3    Chest imaging: CTA Chest 10/27/22 No evidence of pulmonary embolism.   Severe pulmonary emphysema. Increased diffuse ground-glass and  new consolidative airspace disease in the lung bases and peripheral posterior aspect of the right upper lobe, concerning for pneumonia. Trace pleural effusions and trace pericardial effusion. Recommend follow-up chest CT in 3 months.   Unchanged right apical pleuroparenchymal scarring with waxing and waning nodular features dating back to at least June 2020.   Multiple prominent mediastinal lymph nodes, favored to be reactive. Recommend attention on follow-up.  PFT:    Latest Ref Rng & Units 06/26/2020    3:50 PM 04/27/2018    8:48 AM  PFT Results  FVC-Pre L 3.75  4.11   FVC-Predicted Pre % 111  119   FVC-Post L 3.85  4.07   FVC-Predicted Post % 113  118   Pre FEV1/FVC % % 78  79   Post FEV1/FCV % % 78  78   FEV1-Pre L 2.92  3.23   FEV1-Predicted Pre % 122  131   FEV1-Post L 3.02  3.20   DLCO uncorrected ml/min/mmHg 9.44  11.81   DLCO UNC% % 44  44   DLCO corrected ml/min/mmHg 9.61  11.92   DLCO COR %Predicted % 44  44   DLVA Predicted % 45  49   TLC L 5.75  5.24   TLC % Predicted % 92  84   RV % Predicted % 87  51   PFTs 2021 severe diffusion defect  Labs:  Path:  Echo 08/03/22: LV EF 55-60%. Grade I diastolic dysfunction. RV size is mildly enlarged and function is normal. LA is mild to moderately dilated.   Heart Catheterization:  Assessment & Plan:   No diagnosis found.  Discussion: Frank Meyer is an 83 year old male, former smoker with COPD/Emphysema, chronic hypoxemic respiratory failure, pulmonary hypertension, and diastolic heart failure who returns to pulmonary clinic for follow up.  Chronic respiratory failure secondary to COPD Requires supplemental oxygen and experiences dyspnea. Maintains activity despite limitations. Recent exacerbation treated with prednisone  and antibiotics. - Continue oxygen therapy at 5-6 L/min during the day and 2 L/min at night. - Continue Anoro inhaler daily. - Continue DuoNeb nebulizers twice a day. - Schedule  follow-up in six months unless symptoms change.  Chronic obstructive pulmonary disease (COPD) Maintains activity and quality of life through exercise. Recent exacerbation managed with prednisone  and antibiotics. Exercise noted as beneficial. - Continue Anoro inhaler daily. - Continue DuoNeb nebulizers twice a day.  Goals of Care Informed of prognosis, desires to maintain quality of life through activity. Understands progressive nature of condition and potential need for hospice care. - Continue current management plan focusing on quality of life. - Discuss hospice care options if condition significantly worsens.  Follow up in 6 months  Duaine German, MD [facility] Office: [phone]    Current Outpatient Medications:    albuterol  (VENTOLIN  HFA) 108 (90 Base) MCG/ACT inhaler, Inhale 1-2 puffs into the lungs every 4 (four) hours as needed for wheezing or shortness of breath., Disp: , Rfl:    aspirin   81 MG EC tablet, Take 81 mg by mouth daily., Disp: , Rfl:    atorvastatin  (LIPITOR) 20 MG tablet, Take 20 mg by mouth every evening., Disp: , Rfl:    cetirizine (ZYRTEC) 10 MG tablet, Take 10 mg by mouth daily., Disp: , Rfl:    cholecalciferol  (VITAMIN D ) 1000 UNITS tablet, Take 1,000 Units by mouth daily., Disp: , Rfl:    donepezil  (ARICEPT ) 10 MG tablet, TAKE 1 TABLET BY MOUTH DAILY, Disp: 60 tablet, Rfl: 5   ENTRESTO  24-26 MG, TAKE 1 TABLET BY MOUTH DAILY, Disp: 100 tablet, Rfl: 2   finasteride  (PROSCAR ) 5 MG tablet, Take 5 mg by mouth at bedtime., Disp: , Rfl:    furosemide  (LASIX ) 40 MG tablet, Take 1 tablet (40 mg total) by mouth every other day. (Patient taking differently: Take 40 mg by mouth daily.), Disp: 30 tablet, Rfl: 0   ipratropium (ATROVENT ) 0.06 % nasal spray, Place 1-2 sprays into both nostrils See admin instructions. 2 sprays in the right nostril and 1 spray in the left nostril once daily, Disp: , Rfl:    ipratropium-albuterol  (DUONEB) 0.5-2.5 (3)  MG/3ML SOLN, USE 1 VIAL VIA NEBULIZER TWICE  DAILY (Patient taking differently: Take 3 mLs by nebulization 2 (two) times daily.), Disp: 720 mL, Rfl: 2   levETIRAcetam  (KEPPRA ) 250 MG tablet, TAKE 1 TABLET BY MOUTH IN THE  MORNING AND 2 TABLETS AT NIGHT (Patient taking differently: Take 250-500 mg by mouth See admin instructions. Take one tablet by mouth in the morning and take two tablets by mouth in the evening daily.), Disp: 300 tablet, Rfl: 2   Melatonin 10 MG TABS, Take 20 mg by mouth at bedtime., Disp: , Rfl:    omeprazole (PRILOSEC) 40 MG capsule, Take 40 mg by mouth daily. , Disp: , Rfl:    OXYGEN, Inhale 2-5 L into the lungs as directed. 5lt during the day and 2lt at night, Disp: , Rfl:    thiamine  100 MG tablet, Take 1 tablet (100 mg total) by mouth daily., Disp: 30 tablet, Rfl: 0   traZODone  (DESYREL ) 100 MG tablet, TAKE 2 TABLETS BY MOUTH AT  BEDTIME, Disp: 60 tablet, Rfl: 11   umeclidinium-vilanterol (ANORO ELLIPTA ) 62.5-25 MCG/ACT AEPB, Inhale 1 puff into the lungs daily., Disp: 60 each, Rfl: 11   nitroGLYCERIN  (NITROSTAT ) 0.4 MG SL tablet, Place 1 tablet (0.4 mg total) under the tongue every 5 (five) minutes as needed. Chest pain (Patient taking differently: Place 0.4 mg under the tongue every 5 (five) minutes as needed for chest pain. Chest pain), Disp: 25 tablet, Rfl: 6

## 2024-01-12 ENCOUNTER — Ambulatory Visit: Payer: Medicare Other | Admitting: Pulmonary Disease

## 2024-01-19 ENCOUNTER — Encounter: Payer: Self-pay | Admitting: Pulmonary Disease

## 2024-01-21 ENCOUNTER — Other Ambulatory Visit: Payer: Self-pay | Admitting: Pulmonary Disease

## 2024-01-21 DIAGNOSIS — J439 Emphysema, unspecified: Secondary | ICD-10-CM

## 2024-01-23 ENCOUNTER — Inpatient Hospital Stay (HOSPITAL_BASED_OUTPATIENT_CLINIC_OR_DEPARTMENT_OTHER)
Admission: EM | Admit: 2024-01-23 | Discharge: 2024-01-26 | DRG: 871 | Disposition: A | Discharge status: Home Health | Attending: Student | Admitting: Student

## 2024-01-23 ENCOUNTER — Encounter (HOSPITAL_BASED_OUTPATIENT_CLINIC_OR_DEPARTMENT_OTHER): Payer: Self-pay | Admitting: Urology

## 2024-01-23 ENCOUNTER — Other Ambulatory Visit: Payer: Self-pay

## 2024-01-23 ENCOUNTER — Inpatient Hospital Stay (HOSPITAL_COMMUNITY)

## 2024-01-23 ENCOUNTER — Emergency Department (HOSPITAL_BASED_OUTPATIENT_CLINIC_OR_DEPARTMENT_OTHER)

## 2024-01-23 DIAGNOSIS — N401 Enlarged prostate with lower urinary tract symptoms: Secondary | ICD-10-CM | POA: Diagnosis present

## 2024-01-23 DIAGNOSIS — Z85828 Personal history of other malignant neoplasm of skin: Secondary | ICD-10-CM

## 2024-01-23 DIAGNOSIS — R9431 Abnormal electrocardiogram [ECG] [EKG]: Secondary | ICD-10-CM | POA: Diagnosis not present

## 2024-01-23 DIAGNOSIS — I3139 Other pericardial effusion (noninflammatory): Secondary | ICD-10-CM | POA: Diagnosis present

## 2024-01-23 DIAGNOSIS — I5020 Unspecified systolic (congestive) heart failure: Secondary | ICD-10-CM

## 2024-01-23 DIAGNOSIS — K5900 Constipation, unspecified: Secondary | ICD-10-CM

## 2024-01-23 DIAGNOSIS — J431 Panlobular emphysema: Secondary | ICD-10-CM | POA: Diagnosis present

## 2024-01-23 DIAGNOSIS — I9589 Other hypotension: Secondary | ICD-10-CM | POA: Diagnosis present

## 2024-01-23 DIAGNOSIS — W19XXXA Unspecified fall, initial encounter: Secondary | ICD-10-CM

## 2024-01-23 DIAGNOSIS — E785 Hyperlipidemia, unspecified: Secondary | ICD-10-CM

## 2024-01-23 DIAGNOSIS — J9621 Acute and chronic respiratory failure with hypoxia: Secondary | ICD-10-CM | POA: Diagnosis not present

## 2024-01-23 DIAGNOSIS — I340 Nonrheumatic mitral (valve) insufficiency: Secondary | ICD-10-CM | POA: Diagnosis present

## 2024-01-23 DIAGNOSIS — I10 Essential (primary) hypertension: Secondary | ICD-10-CM | POA: Diagnosis not present

## 2024-01-23 DIAGNOSIS — J9611 Chronic respiratory failure with hypoxia: Secondary | ICD-10-CM | POA: Diagnosis not present

## 2024-01-23 DIAGNOSIS — Z79899 Other long term (current) drug therapy: Secondary | ICD-10-CM

## 2024-01-23 DIAGNOSIS — Z7982 Long term (current) use of aspirin: Secondary | ICD-10-CM

## 2024-01-23 DIAGNOSIS — A419 Sepsis, unspecified organism: Secondary | ICD-10-CM | POA: Diagnosis present

## 2024-01-23 DIAGNOSIS — Z8673 Personal history of transient ischemic attack (TIA), and cerebral infarction without residual deficits: Secondary | ICD-10-CM

## 2024-01-23 DIAGNOSIS — Z9981 Dependence on supplemental oxygen: Secondary | ICD-10-CM | POA: Diagnosis not present

## 2024-01-23 DIAGNOSIS — I959 Hypotension, unspecified: Secondary | ICD-10-CM | POA: Diagnosis present

## 2024-01-23 DIAGNOSIS — R739 Hyperglycemia, unspecified: Secondary | ICD-10-CM | POA: Diagnosis present

## 2024-01-23 DIAGNOSIS — Z1152 Encounter for screening for COVID-19: Secondary | ICD-10-CM

## 2024-01-23 DIAGNOSIS — N1831 Chronic kidney disease, stage 3a: Secondary | ICD-10-CM | POA: Diagnosis present

## 2024-01-23 DIAGNOSIS — R569 Unspecified convulsions: Secondary | ICD-10-CM | POA: Diagnosis present

## 2024-01-23 DIAGNOSIS — Z87891 Personal history of nicotine dependence: Secondary | ICD-10-CM

## 2024-01-23 DIAGNOSIS — J440 Chronic obstructive pulmonary disease with acute lower respiratory infection: Secondary | ICD-10-CM | POA: Diagnosis present

## 2024-01-23 DIAGNOSIS — Z9049 Acquired absence of other specified parts of digestive tract: Secondary | ICD-10-CM

## 2024-01-23 DIAGNOSIS — I5042 Chronic combined systolic (congestive) and diastolic (congestive) heart failure: Secondary | ICD-10-CM | POA: Diagnosis present

## 2024-01-23 DIAGNOSIS — I272 Pulmonary hypertension, unspecified: Secondary | ICD-10-CM | POA: Diagnosis present

## 2024-01-23 DIAGNOSIS — Z961 Presence of intraocular lens: Secondary | ICD-10-CM | POA: Diagnosis present

## 2024-01-23 DIAGNOSIS — F0390 Unspecified dementia without behavioral disturbance: Secondary | ICD-10-CM | POA: Diagnosis present

## 2024-01-23 DIAGNOSIS — W1830XA Fall on same level, unspecified, initial encounter: Secondary | ICD-10-CM | POA: Diagnosis present

## 2024-01-23 DIAGNOSIS — Z66 Do not resuscitate: Secondary | ICD-10-CM | POA: Diagnosis present

## 2024-01-23 DIAGNOSIS — E440 Moderate protein-calorie malnutrition: Secondary | ICD-10-CM | POA: Diagnosis present

## 2024-01-23 DIAGNOSIS — Y92009 Unspecified place in unspecified non-institutional (private) residence as the place of occurrence of the external cause: Secondary | ICD-10-CM

## 2024-01-23 DIAGNOSIS — I130 Hypertensive heart and chronic kidney disease with heart failure and stage 1 through stage 4 chronic kidney disease, or unspecified chronic kidney disease: Secondary | ICD-10-CM | POA: Diagnosis present

## 2024-01-23 DIAGNOSIS — J449 Chronic obstructive pulmonary disease, unspecified: Secondary | ICD-10-CM

## 2024-01-23 DIAGNOSIS — N179 Acute kidney failure, unspecified: Secondary | ICD-10-CM

## 2024-01-23 DIAGNOSIS — I2510 Atherosclerotic heart disease of native coronary artery without angina pectoris: Secondary | ICD-10-CM | POA: Diagnosis present

## 2024-01-23 DIAGNOSIS — J189 Pneumonia, unspecified organism: Secondary | ICD-10-CM | POA: Diagnosis present

## 2024-01-23 DIAGNOSIS — K219 Gastro-esophageal reflux disease without esophagitis: Secondary | ICD-10-CM | POA: Diagnosis present

## 2024-01-23 DIAGNOSIS — E871 Hypo-osmolality and hyponatremia: Secondary | ICD-10-CM | POA: Diagnosis present

## 2024-01-23 DIAGNOSIS — R6521 Severe sepsis with septic shock: Secondary | ICD-10-CM | POA: Diagnosis present

## 2024-01-23 DIAGNOSIS — Z860100 Personal history of colon polyps, unspecified: Secondary | ICD-10-CM

## 2024-01-23 DIAGNOSIS — D649 Anemia, unspecified: Secondary | ICD-10-CM | POA: Diagnosis present

## 2024-01-23 LAB — COMPREHENSIVE METABOLIC PANEL WITH GFR
ALT: 17 U/L (ref 0–44)
AST: 32 U/L (ref 15–41)
Albumin: 3.3 g/dL — ABNORMAL LOW (ref 3.5–5.0)
Alkaline Phosphatase: 71 U/L (ref 38–126)
Anion gap: 11 (ref 5–15)
BUN: 24 mg/dL — ABNORMAL HIGH (ref 8–23)
CO2: 26 mmol/L (ref 22–32)
Calcium: 9 mg/dL (ref 8.9–10.3)
Chloride: 100 mmol/L (ref 98–111)
Creatinine, Ser: 1.74 mg/dL — ABNORMAL HIGH (ref 0.61–1.24)
GFR, Estimated: 38 mL/min — ABNORMAL LOW (ref >60)
Glucose, Bld: 111 mg/dL — ABNORMAL HIGH (ref 70–99)
Potassium: 4.2 mmol/L (ref 3.5–5.1)
Sodium: 137 mmol/L (ref 135–145)
Total Bilirubin: 0.8 mg/dL (ref 0.0–1.2)
Total Protein: 6.4 g/dL — ABNORMAL LOW (ref 6.5–8.1)

## 2024-01-23 LAB — CREATININE, SERUM
Creatinine, Ser: 1.5 mg/dL — ABNORMAL HIGH (ref 0.61–1.24)
GFR, Estimated: 46 mL/min — ABNORMAL LOW (ref >60)

## 2024-01-23 LAB — ECHOCARDIOGRAM COMPLETE
AR max vel: 2.97 cm2
AV Peak grad: 6.1 mmHg
Ao pk vel: 1.23 m/s
Area-P 1/2: 3.21 cm2
Est EF: 55
Height: 66 in
S' Lateral: 2.9 cm
Weight: 2578.5 [oz_av]

## 2024-01-23 LAB — URINALYSIS, W/ REFLEX TO CULTURE (INFECTION SUSPECTED)
Bilirubin Urine: NEGATIVE
Glucose, UA: NEGATIVE mg/dL
Hgb urine dipstick: NEGATIVE
Ketones, ur: NEGATIVE mg/dL
Leukocytes,Ua: NEGATIVE
Nitrite: NEGATIVE
Protein, ur: NEGATIVE mg/dL
RBC / HPF: NONE SEEN RBC/hpf (ref 0–5)
Specific Gravity, Urine: 1.015 (ref 1.005–1.030)
pH: 5.5 (ref 5.0–8.0)

## 2024-01-23 LAB — CBC
HCT: 31.5 % — ABNORMAL LOW (ref 39.0–52.0)
Hemoglobin: 9.9 g/dL — ABNORMAL LOW (ref 13.0–17.0)
MCH: 26.4 pg (ref 26.0–34.0)
MCHC: 31.4 g/dL (ref 30.0–36.0)
MCV: 84 fL (ref 80.0–100.0)
Platelets: 299 10*3/uL (ref 150–400)
RBC: 3.75 MIL/uL — ABNORMAL LOW (ref 4.22–5.81)
RDW: 13.6 % (ref 11.5–15.5)
WBC: 8.9 10*3/uL (ref 4.0–10.5)
nRBC: 0 % (ref 0.0–0.2)

## 2024-01-23 LAB — CBC WITH DIFFERENTIAL/PLATELET
Abs Immature Granulocytes: 0.06 10*3/uL (ref 0.00–0.07)
Basophils Absolute: 0.1 10*3/uL (ref 0.0–0.1)
Basophils Relative: 1 %
Eosinophils Absolute: 0.2 10*3/uL (ref 0.0–0.5)
Eosinophils Relative: 1 %
HCT: 33.5 % — ABNORMAL LOW (ref 39.0–52.0)
Hemoglobin: 10.7 g/dL — ABNORMAL LOW (ref 13.0–17.0)
Immature Granulocytes: 1 %
Lymphocytes Relative: 21 %
Lymphs Abs: 2.6 10*3/uL (ref 0.7–4.0)
MCH: 26.3 pg (ref 26.0–34.0)
MCHC: 31.9 g/dL (ref 30.0–36.0)
MCV: 82.3 fL (ref 80.0–100.0)
Monocytes Absolute: 1.3 10*3/uL — ABNORMAL HIGH (ref 0.1–1.0)
Monocytes Relative: 11 %
Neutro Abs: 7.8 10*3/uL — ABNORMAL HIGH (ref 1.7–7.7)
Neutrophils Relative %: 65 %
Platelets: 354 10*3/uL (ref 150–400)
RBC: 4.07 MIL/uL — ABNORMAL LOW (ref 4.22–5.81)
RDW: 13.6 % (ref 11.5–15.5)
WBC: 12 10*3/uL — ABNORMAL HIGH (ref 4.0–10.5)
nRBC: 0 % (ref 0.0–0.2)

## 2024-01-23 LAB — PROTIME-INR
INR: 1.1 (ref 0.8–1.2)
Prothrombin Time: 14.4 s (ref 11.4–15.2)

## 2024-01-23 LAB — RESP PANEL BY RT-PCR (RSV, FLU A&B, COVID)  RVPGX2
Influenza A by PCR: NEGATIVE
Influenza B by PCR: NEGATIVE
Resp Syncytial Virus by PCR: NEGATIVE
SARS Coronavirus 2 by RT PCR: NEGATIVE

## 2024-01-23 LAB — I-STAT VENOUS BLOOD GAS, ED
Acid-Base Excess: 4 mmol/L — ABNORMAL HIGH (ref 0.0–2.0)
Bicarbonate: 28.4 mmol/L — ABNORMAL HIGH (ref 20.0–28.0)
Calcium, Ion: 1.16 mmol/L (ref 1.15–1.40)
HCT: 32 % — ABNORMAL LOW (ref 39.0–52.0)
Hemoglobin: 10.9 g/dL — ABNORMAL LOW (ref 13.0–17.0)
O2 Saturation: 56 %
Patient temperature: 97.5
Potassium: 4 mmol/L (ref 3.5–5.1)
Sodium: 136 mmol/L (ref 135–145)
TCO2: 30 mmol/L (ref 22–32)
pCO2, Ven: 41.7 mmHg — ABNORMAL LOW (ref 44–60)
pH, Ven: 7.438 — ABNORMAL HIGH (ref 7.25–7.43)
pO2, Ven: 28 mmHg — CL (ref 32–45)

## 2024-01-23 LAB — GLUCOSE, CAPILLARY: Glucose-Capillary: 107 mg/dL — ABNORMAL HIGH (ref 70–99)

## 2024-01-23 LAB — LACTIC ACID, PLASMA: Lactic Acid, Venous: 1.4 mmol/L (ref 0.5–1.9)

## 2024-01-23 LAB — MRSA NEXT GEN BY PCR, NASAL: MRSA by PCR Next Gen: NOT DETECTED

## 2024-01-23 LAB — TROPONIN T, HIGH SENSITIVITY
Troponin T High Sensitivity: 447 ng/L (ref <19)
Troponin T High Sensitivity: 252 ng/L (ref <19)

## 2024-01-23 MED ORDER — PANTOPRAZOLE SODIUM 40 MG PO TBEC
40.0000 mg | DELAYED_RELEASE_TABLET | Freq: Every day | ORAL | Status: DC
  Administered 2024-01-24 – 2024-01-26 (×3): 40 mg via ORAL
  Filled 2024-01-23 (×3): qty 1

## 2024-01-23 MED ORDER — NOREPINEPHRINE 4 MG/250ML-% IV SOLN: 0.0000 ug/min | INTRAVENOUS | Status: DC

## 2024-01-23 MED ORDER — DONEPEZIL HCL 10 MG PO TABS
10.0000 mg | ORAL_TABLET | Freq: Every day | ORAL | Status: DC
  Administered 2024-01-24 – 2024-01-26 (×3): 10 mg via ORAL
  Filled 2024-01-23 (×3): qty 1

## 2024-01-23 MED ORDER — LACTATED RINGERS IV BOLUS (SEPSIS)
1000.0000 mL | Freq: Once | INTRAVENOUS | Status: AC
  Administered 2024-01-23: 1000 mL via INTRAVENOUS

## 2024-01-23 MED ORDER — LACTATED RINGERS IV SOLN: INTRAVENOUS | Status: DC

## 2024-01-23 MED ORDER — IPRATROPIUM-ALBUTEROL 0.5-2.5 (3) MG/3ML IN SOLN
3.0000 mL | Freq: Two times a day (BID) | RESPIRATORY_TRACT | Status: DC
  Administered 2024-01-23 – 2024-01-26 (×6): 3 mL via RESPIRATORY_TRACT
  Filled 2024-01-23 (×6): qty 3

## 2024-01-23 MED ORDER — ATORVASTATIN CALCIUM 10 MG PO TABS
20.0000 mg | ORAL_TABLET | Freq: Every evening | ORAL | Status: DC
  Administered 2024-01-24 – 2024-01-25 (×2): 20 mg via ORAL
  Filled 2024-01-23 (×2): qty 2

## 2024-01-23 MED ORDER — ASPIRIN 81 MG PO TBEC
81.0000 mg | DELAYED_RELEASE_TABLET | Freq: Every day | ORAL | Status: DC
  Administered 2024-01-24 – 2024-01-26 (×3): 81 mg via ORAL
  Filled 2024-01-23 (×3): qty 1

## 2024-01-23 MED ORDER — ARFORMOTEROL TARTRATE 15 MCG/2ML IN NEBU
15.0000 ug | INHALATION_SOLUTION | Freq: Two times a day (BID) | RESPIRATORY_TRACT | Status: DC
  Administered 2024-01-23 – 2024-01-26 (×6): 15 ug via RESPIRATORY_TRACT
  Filled 2024-01-23 (×6): qty 2

## 2024-01-23 MED ORDER — SODIUM CHLORIDE 0.9 % IV SOLN
2.0000 g | Freq: Two times a day (BID) | INTRAVENOUS | Status: DC
  Administered 2024-01-23: 2 g via INTRAVENOUS
  Filled 2024-01-23: qty 12.5

## 2024-01-23 MED ORDER — POLYETHYLENE GLYCOL 3350 17 G PO PACK: 17.0000 g | PACK | Freq: Every day | ORAL | Status: DC | PRN

## 2024-01-23 MED ORDER — METRONIDAZOLE 500 MG/100ML IV SOLN
500.0000 mg | Freq: Once | INTRAVENOUS | Status: AC
  Administered 2024-01-23: 500 mg via INTRAVENOUS
  Filled 2024-01-23: qty 100

## 2024-01-23 MED ORDER — LEVETIRACETAM 500 MG PO TABS: 500.0000 mg | ORAL_TABLET | Freq: Every day | ORAL | Status: DC

## 2024-01-23 MED ORDER — VANCOMYCIN HCL IN DEXTROSE 1-5 GM/200ML-% IV SOLN
1000.0000 mg | Freq: Once | INTRAVENOUS | Status: AC
  Administered 2024-01-23: 1000 mg via INTRAVENOUS
  Filled 2024-01-23: qty 200

## 2024-01-23 MED ORDER — LEVETIRACETAM 250 MG PO TABS
250.0000 mg | ORAL_TABLET | Freq: Every morning | ORAL | Status: DC
  Administered 2024-01-24 – 2024-01-26 (×3): 250 mg via ORAL
  Filled 2024-01-23 (×4): qty 1

## 2024-01-23 MED ORDER — HEPARIN SODIUM (PORCINE) 5000 UNIT/ML IJ SOLN
5000.0000 [IU] | Freq: Three times a day (TID) | INTRAMUSCULAR | Status: DC
  Administered 2024-01-23 – 2024-01-26 (×7): 5000 [IU] via SUBCUTANEOUS
  Filled 2024-01-23 (×9): qty 1

## 2024-01-23 MED ORDER — VANCOMYCIN HCL 500 MG/100ML IV SOLN
500.0000 mg | Freq: Once | INTRAVENOUS | Status: AC
  Administered 2024-01-23: 500 mg via INTRAVENOUS
  Filled 2024-01-23: qty 100

## 2024-01-23 MED ORDER — NOREPINEPHRINE 4 MG/250ML-% IV SOLN
0.0000 ug/min | INTRAVENOUS | Status: DC
  Administered 2024-01-23: 2 ug/min via INTRAVENOUS
  Filled 2024-01-23: qty 250

## 2024-01-23 MED ORDER — DOCUSATE SODIUM 100 MG PO CAPS
100.0000 mg | ORAL_CAPSULE | Freq: Two times a day (BID) | ORAL | Status: DC | PRN
Start: 2024-01-23 — End: 2024-01-26

## 2024-01-23 MED ORDER — VANCOMYCIN HCL 1500 MG/300ML IV SOLN: 1500.0000 mg | INTRAVENOUS | Status: DC

## 2024-01-23 MED ORDER — THIAMINE HCL 100 MG PO TABS
100.0000 mg | ORAL_TABLET | Freq: Every day | ORAL | Status: DC
  Administered 2024-01-24 – 2024-01-26 (×3): 100 mg via ORAL
  Filled 2024-01-23 (×6): qty 1

## 2024-01-23 MED ORDER — REVEFENACIN 175 MCG/3ML IN SOLN
175.0000 ug | Freq: Every day | RESPIRATORY_TRACT | Status: DC
  Administered 2024-01-25 – 2024-01-26 (×2): 175 ug via RESPIRATORY_TRACT
  Filled 2024-01-23 (×2): qty 3

## 2024-01-23 MED ORDER — LACTATED RINGERS IV BOLUS (SEPSIS)
250.0000 mL | Freq: Once | INTRAVENOUS | Status: AC
  Administered 2024-01-23: 250 mL via INTRAVENOUS

## 2024-01-23 MED ORDER — VITAMIN D 25 MCG (1000 UNIT) PO TABS
1000.0000 [IU] | ORAL_TABLET | Freq: Every day | ORAL | Status: DC
  Administered 2024-01-24 – 2024-01-26 (×3): 1000 [IU] via ORAL
  Filled 2024-01-23 (×3): qty 1

## 2024-01-23 MED ORDER — LEVETIRACETAM 250 MG PO TABS: 250.0000 mg | ORAL_TABLET | Freq: Every day | ORAL | Status: DC

## 2024-01-23 MED ORDER — LEVETIRACETAM 500 MG PO TABS
500.0000 mg | ORAL_TABLET | Freq: Every evening | ORAL | Status: DC
  Administered 2024-01-23 – 2024-01-25 (×3): 500 mg via ORAL
  Filled 2024-01-23 (×3): qty 1

## 2024-01-23 MED ORDER — FINASTERIDE 5 MG PO TABS
5.0000 mg | ORAL_TABLET | Freq: Every day | ORAL | Status: DC
  Administered 2024-01-24 – 2024-01-25 (×2): 5 mg via ORAL
  Filled 2024-01-23 (×3): qty 1

## 2024-01-23 MED ORDER — SODIUM CHLORIDE 0.9 % IV SOLN
2.0000 g | Freq: Once | INTRAVENOUS | Status: AC
  Administered 2024-01-23: 2 g via INTRAVENOUS
  Filled 2024-01-23: qty 12.5

## 2024-01-23 MED ORDER — SODIUM CHLORIDE 0.9 % IV SOLN: 250.0000 mL | INTRAVENOUS | Status: AC

## 2024-01-23 NOTE — ED Notes (Signed)
 Called [facility] for transport.

## 2024-01-23 NOTE — ED Notes (Signed)
 ED Provider at bedside.

## 2024-01-23 NOTE — Progress Notes (Signed)
 Elink following for sepsis protocal

## 2024-01-23 NOTE — ED Triage Notes (Signed)
 Pt states fall during the night after tripping on oxygen tubing, small abrasion noted to head Not on Blood thinners  Wife noticed BP was low this am as well and SPO2 was low Pt states on 6L oxygen during the day

## 2024-01-23 NOTE — ED Provider Notes (Signed)
 [place] [facility] AT [facility] Provider Note   CSN: [id number] Arrival date & time: 01/23/24  1038     History  Chief Complaint  Patient presents with   Fall   Hypotension    Frank Meyer is a 83 y.o. male.  The history is provided by the patient, the spouse and medical records. No language interpreter was used.  Fall This is a new problem. The problem occurs rarely. The problem has not changed since onset.Pertinent negatives include no chest pain, no abdominal pain, no headaches and no shortness of breath. Nothing aggravates the symptoms. Nothing relieves the symptoms. He has tried nothing for the symptoms. The treatment provided no relief.       Home Medications Prior to Admission medications   Medication Sig Start Date End Date Taking? Authorizing Provider  albuterol  (VENTOLIN  HFA) 108 (90 Base) MCG/ACT inhaler Inhale 1-2 puffs into the lungs every 4 (four) hours as needed for wheezing or shortness of breath.    [provider]  ANORO ELLIPTA  62.5-25 MCG/ACT AEPB USE 1 INHALATION BY MOUTH ONCE  DAILY AT THE SAME TIME EACH DAY 01/21/24   Hunsucker, Archer Kobs, MD  aspirin  81 MG EC tablet Take 81 mg by mouth daily.    [provider]  atorvastatin  (LIPITOR) 20 MG tablet Take 20 mg by mouth every evening.    [provider]  cetirizine (ZYRTEC) 10 MG tablet Take 10 mg by mouth daily.    [provider]  cholecalciferol  (VITAMIN D ) 1000 UNITS tablet Take 1,000 Units by mouth daily.    [provider]  donepezil  (ARICEPT ) 10 MG tablet TAKE 1 TABLET BY MOUTH DAILY 08/30/23   Camara, Amadou, MD  ENTRESTO  24-26 MG TAKE 1 TABLET BY MOUTH DAILY 06/18/23   Krasowski, Robert J, MD  finasteride  (PROSCAR ) 5 MG tablet Take 5 mg by mouth at bedtime.    [provider]  furosemide  (LASIX ) 40 MG tablet Take 1 tablet (40 mg total) by mouth every other day. Patient taking differently: Take 40 mg by mouth daily.  10/31/22   Paige, Victoria J, DO  ipratropium (ATROVENT ) 0.06 % nasal spray Place 1-2 sprays into both nostrils See admin instructions. 2 sprays in the right nostril and 1 spray in the left nostril once daily 02/09/13   [provider]  ipratropium-albuterol  (DUONEB) 0.5-2.5 (3) MG/3ML SOLN Take 3 mLs by nebulization every 6 (six) hours as needed. 01/11/24   Wilfredo Hanly, MD  levETIRAcetam  (KEPPRA ) 250 MG tablet TAKE 1 TABLET BY MOUTH IN THE  MORNING AND 2 TABLETS AT NIGHT Patient taking differently: Take 250-500 mg by mouth See admin instructions. Take one tablet by mouth in the morning and take two tablets by mouth in the evening daily. 10/06/23   Camara, Amadou, MD  Melatonin 10 MG TABS Take 20 mg by mouth at bedtime.    [provider]  nitroGLYCERIN  (NITROSTAT ) 0.4 MG SL tablet Place 1 tablet (0.4 mg total) under the tongue every 5 (five) minutes as needed. Chest pain Patient taking differently: Place 0.4 mg under the tongue every 5 (five) minutes as needed for chest pain. Chest pain 07/15/21 11/12/23  Krasowski, Robert J, MD  omeprazole (PRILOSEC) 40 MG capsule Take 40 mg by mouth daily.  03/29/13   [provider]  OXYGEN Inhale 2-5 L into the lungs as directed. 5lt during the day and 2lt at night    [provider]  thiamine  100 MG tablet  Take 1 tablet (100 mg total) by mouth daily. 12/13/18   Vann, Jessica U, DO  traZODone  (DESYREL ) 100 MG tablet TAKE 2 TABLETS BY MOUTH AT  BEDTIME 08/02/23   Cassandra Cleveland, MD      Allergies    Patient has no known allergies.    Review of Systems   Review of Systems  Constitutional:  Positive for fatigue. Negative for chills and fever.  Eyes:  Negative for visual disturbance.  Respiratory:  Negative for cough, chest tightness, shortness of breath and wheezing.   Cardiovascular:  Negative for chest pain, palpitations and leg swelling.  Gastrointestinal:  Negative for abdominal pain, constipation, diarrhea, nausea and  vomiting.  Genitourinary:  Negative for dysuria.  Musculoskeletal:  Negative for back pain, neck pain and neck stiffness.  Skin:  Positive for wound (abrasion). Negative for rash.  Neurological:  Positive for light-headedness. Negative for dizziness, syncope, weakness and headaches.  Psychiatric/Behavioral:  Negative for agitation and confusion.   All other systems reviewed and are negative.   Physical Exam Updated Vital Signs BP (!) 71/52   Pulse 97   Temp (!) 97.5 F (36.4 C) (Oral)   Resp 17   Ht 5\' 6"  (1.676 m)   Wt 73.4 kg   SpO2 97%   BMI 26.12 kg/m  Physical Exam Vitals and nursing note reviewed.  Constitutional:      General: He is not in acute distress.    Appearance: He is well-developed. He is not ill-appearing, toxic-appearing or diaphoretic.  HENT:     Head: Normocephalic and atraumatic.     Nose: No congestion or rhinorrhea.     Mouth/Throat:     Mouth: Mucous membranes are dry.     Pharynx: No oropharyngeal exudate or posterior oropharyngeal erythema.  Eyes:     Extraocular Movements: Extraocular movements intact.     Conjunctiva/sclera: Conjunctivae normal.     Pupils: Pupils are equal, round, and reactive to light.  Cardiovascular:     Rate and Rhythm: Normal rate and regular rhythm.     Heart sounds: Murmur heard.  Pulmonary:     Effort: Pulmonary effort is normal. No respiratory distress.     Breath sounds: Normal breath sounds.  Abdominal:     Palpations: Abdomen is soft.     Tenderness: There is no abdominal tenderness. There is no guarding or rebound.  Musculoskeletal:        General: No swelling or tenderness.     Cervical back: Neck supple. No tenderness.     Right lower leg: No edema.     Left lower leg: No edema.  Skin:    General: Skin is warm and dry.     Capillary Refill: Capillary refill takes less than 2 seconds.     Findings: No erythema or rash.  Neurological:     General: No focal deficit present.     Mental Status: He is  alert.     Sensory: No sensory deficit.     Motor: No weakness.  Psychiatric:        Mood and Affect: Mood normal.     ED Results / Procedures / Treatments   Labs (all labs ordered are listed, but only abnormal results are displayed) Labs Reviewed  CBC WITH DIFFERENTIAL/PLATELET - Abnormal; Notable for the following components:      Result Value   WBC 12.0 (*)    RBC 4.07 (*)    Hemoglobin 10.7 (*)    HCT 33.5 (*)  Neutro Abs 7.8 (*)    Monocytes Absolute 1.3 (*)    All other components within normal limits  COMPREHENSIVE METABOLIC PANEL WITH GFR - Abnormal; Notable for the following components:   Glucose, Bld 111 (*)    BUN 24 (*)    Creatinine, Ser 1.74 (*)    Total Protein 6.4 (*)    Albumin  3.3 (*)    GFR, Estimated 38 (*)    All other components within normal limits  URINALYSIS, W/ REFLEX TO CULTURE (INFECTION SUSPECTED) - Abnormal; Notable for the following components:   Bacteria, UA FEW (*)    All other components within normal limits  I-STAT VENOUS BLOOD GAS, ED - Abnormal; Notable for the following components:   pH, Ven 7.438 (*)    pCO2, Ven 41.7 (*)    pO2, Ven 28 (*)    Bicarbonate 28.4 (*)    Acid-Base Excess 4.0 (*)    HCT 32.0 (*)    Hemoglobin 10.9 (*)    All other components within normal limits  TROPONIN T, HIGH SENSITIVITY - Abnormal; Notable for the following components:   Troponin T High Sensitivity 447 (*)    All other components within normal limits  RESP PANEL BY RT-PCR (RSV, FLU A&B, COVID)  RVPGX2  CULTURE, BLOOD (ROUTINE X 2)  CULTURE, BLOOD (ROUTINE X 2)  LACTIC ACID, PLASMA  PROTIME-INR  TROPONIN T, HIGH SENSITIVITY    EKG EKG Interpretation Date/Time:  Sunday January 23 2024 10:47:07 EDT Ventricular Rate:  97 PR Interval:  159 QRS Duration:  89 QT Interval:  347 QTC Calculation: 441 R Axis:   43  Text Interpretation: Sinus rhythm Borderline T abnormalities, anterior leads Minimal ST elevation, inferior leads when compared to  prior, slower rate No STEMI Confirmed by Wynell Heath ([id number]) on 01/23/2024 10:50:31 AM  Radiology CT Head Wo Contrast Result Date: 01/23/2024 CLINICAL DATA:  Trip and fall last night EXAM: CT HEAD WITHOUT CONTRAST TECHNIQUE: Contiguous axial images were obtained from the base of the skull through the vertex without intravenous contrast. RADIATION DOSE REDUCTION: This exam was performed according to the departmental dose-optimization program which includes automated exposure control, adjustment of the mA and/or kV according to patient size and/or use of iterative reconstruction technique. COMPARISON:  11/06/2021 FINDINGS: Brain: No evidence of acute infarction, hemorrhage, hydrocephalus, extra-axial collection or mass lesion/mass effect. Vascular: No hyperdense vessel or unexpected calcification. Skull: Normal. Negative for fracture or focal lesion. Sinuses/Orbits: No acute finding. Other: None. IMPRESSION: No acute intracranial pathology. Electronically Signed   By: Fredricka Jenny M.D.   On: 01/23/2024 13:24   DG Chest Portable 1 View Result Date: 01/23/2024 CLINICAL DATA:  Marvell Slider.  Shortness of breath. EXAM: PORTABLE CHEST 1 VIEW COMPARISON:  11/12/2023 FINDINGS: The heart is mildly enlarged but appears stable. The mediastinal and hilar contours are stable. 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 Persistent dense right apical pleural and parenchymal disease. No definite acute overlying pulmonary process. The bony thorax is intact. IMPRESSION: 1. 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 2. Persistent dense right apical pleural and parenchymal disease. 3. No definite acute overlying pulmonary process. Electronically Signed   By: Marrian Siva M.D.   On: 01/23/2024 11:32    Procedures Procedures   CRITICAL CARE Performed by: Marine Sia Longino Trefz Total critical care time: 45 minutes Critical care time was  exclusive of separately billable procedures and treating other patients. Critical care was necessary to treat or  prevent imminent or life-threatening deterioration. Critical care was time spent personally by me on the following activities: development of treatment plan with patient and/or surrogate as well as nursing, discussions with consultants, evaluation of patient's response to treatment, examination of patient, obtaining history from patient or surrogate, ordering and performing treatments and interventions, ordering and review of laboratory studies, ordering and review of radiographic studies, pulse oximetry and re-evaluation of patient's condition.    Medications Ordered in ED Medications  lactated ringers  infusion ( Intravenous Infusion Verify 01/23/24 1259)  norepinephrine (LEVOPHED) 4mg  in (0.016 mg/mL) premix infusion (2 mcg/min Intravenous New Bag/Given 01/23/24 1427)  lactated ringers  bolus 1,000 mL (0 mLs Intravenous Stopped 01/23/24 1224)    And  lactated ringers  bolus 1,000 mL (0 mLs Intravenous Stopped 01/23/24 1307)    And  lactated ringers  bolus 250 mL (0 mLs Intravenous Stopped 01/23/24 1254)  ceFEPIme  (MAXIPIME ) 2 g in sodium chloride  0.9 % 100 mL IVPB (0 g Intravenous Stopped 01/23/24 1147)  metroNIDAZOLE (FLAGYL) IVPB 500 mg (0 mg Intravenous Stopped 01/23/24 1252)  vancomycin  (VANCOCIN ) IVPB 1000 mg/200 mL premix (0 mg Intravenous Stopped 01/23/24 1406)    ED Course/ Medical Decision Making/ A&P                                 Medical Decision Making Amount and/or Complexity of Data Reviewed Labs: ordered. Radiology: ordered.  Risk Prescription drug management. Decision regarding hospitalization.   Frank Meyer is a 83 y.o. 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  According to patient's family, he got  tangled up on his oxygen cord getting out of bed this morning and ended up falling next to the bed.  He has an abrasion on his head but denied significant headache or loss of consciousness.  Family reports they checked his blood pressure and was in the 60s prompting them to bring him to the emergency department.  Patient reports he is feeling tired but denies chest pain or worsening shortness of breath.  He takes up to 6 L at home during the daytime normally.  He has not had any fevers, chills, chest pain, shortness of breath, nausea, vomiting, diarrhea, or change in some constipation.  On my exam, lungs were relatively clear.  Chest was nontender.  Abdomen nontender but he does have a murmur.  He is hypotensive with blood pressure in the 60s systolic.  He is feeling fatigued and lightheaded.  He has an abrasion to his forehead but otherwise pupils are symmetric and reactive with normal extraocular movement.  No chest tenderness or neck tenderness.  No tenderness in extremities.  No significant bruising otherwise.  Clinically I am concerned about the patient's hypotension.  He was tachypneic and temperature was slightly low.  With abnormal temperature, tachypnea, and hypotension, will activate code sepsis.  Patient will get fluids and broad-spectrum antibiotics.  If fluids do not help blood pressure he may need pressors.  Workup continued to return.  Patient reports he is feeling well and denies complaints now.  He does have an elevated white blood cell count and his metabolic panel does show AKI.  His troponin was surprisingly elevated at 447 up from normal last time it was checked.  Still waiting on urinalysis.  CT head did not show significant traumatic injury and  chest x-ray showed similar chronic changes.  He does not have COVID/flu/RSV and his lactic acid was normal.   1:38 PM Troponin is elevated over 400.  Blood pressure still in the 60s and 70s systolic.  His EKG did not show STEMI and appears  similar on repeat but does have low voltage compared to his prior ones.  I attempted to a bedside ultrasound and do see evidence of a pericardial effusion.  I did not see clear evidence of tamponade but will call cardiology.  Otherwise patient is feeling well and says he feels fine despite the hypotension in the fall with lightheadedness earlier today.  I am concerned that despite on his last bit of fluid he is still hypotensive with a positive troponin he will need admission.  Will call cardiology to discuss if he needs ED to ED for echo and cardiology evaluation versus pressors and admission to the ICU.  1:59 PM Cardiology was called and I spoke to [facility].  He feels the patient does not need acute heart cath but agrees that his strange that his troponin is elevated with his hypotension.  He feels the patient does need a formal echo, needs to go to the ICU for likely pressors, and they will see the patient when he is admitted.  Will also possibly need ED to ED transfer so he can get the echo as we do not do them here.  Anticipate admission after discussion with critical care.  Spoke to Dr. Fulton Job who will accept the patient to the ICU for management.  She agreed with starting pressors and we will start with Levophed.  Patient will be admitted for further management.        Final Clinical Impression(s) / ED Diagnoses Final diagnoses:  Hypotension, unspecified hypotension type  Fall, initial encounter     Clinical Impression: 1. Hypotension, unspecified hypotension type   2. Fall, initial encounter     Disposition: Admit  This note was prepared with assistance of Conservation officer, historic buildings. Occasional wrong-word or sound-a-like substitutions may have occurred due to the inherent limitations of voice recognition software.      Marshella Tello, Marine Sia, MD 01/23/24 [phone]

## 2024-01-23 NOTE — Progress Notes (Signed)
   PCCM transfer request    Sending physician: Tegeler- EDP  Sending facility: [facility]  Reason for transfer:   Brief case summary: Mr. Hakim is an 83 y/o gentleman with HFrEF, chronic resp failure on 6L HOT. Last night fell on oxygen tubing, scraped head> CT head fine.  SBP 60 at home; normally SBP 100 at baseline. In ED confirmed SBP 62 upon arrival. 3L IVF > still SBP in 70s. Denies CP, SOB, illness sx. Trop 447, EKG low voltage, pericardial effusion on bedside echo, does not look like tamponade though  Cardiology requesting admit to ICU.  Recommendations made prior to transfer: Start NE; ED to ED transfer for echo since no ICU beds. Needs to come to [facility] rather than [facility].  Transfer accepted: yes    Joesph Mussel 01/23/24 2:05 PM [facility]  For contact information, see Amion. If no response to pager, please call PCCM consult pager. After hours, 7PM- 7AM, please call Elink.

## 2024-01-23 NOTE — ED Notes (Signed)
 Pt at CT

## 2024-01-23 NOTE — Progress Notes (Addendum)
 Pharmacy Antibiotic Note  Frank Meyer is a 83 y.o. male admitted on 01/23/2024 with possible sepsis.  Pharmacy has been consulted for cefepime  and vancomycin  dosing.  -WBC= 12, afeb -SCr 1.74 (BL 1.0) -cefepime  2gm IV given at 11:40am, 1000mg  IV vancomycin  given at ~ 1pm   Plan: -Vancomycin  500mg  IV x1 (for a total of 1500mg  IV) followed by 1500mg  IV q48hr -Cefepime  2gm IV q12h -Will follow renal function, cultures and clinical progress   Height: 5\' 6"  (167.6 cm) Weight: 73.1 kg (161 lb 2.5 oz) IBW/kg (Calculated) : 63.8  Temp (24hrs), Avg:97.8 F (36.6 C), Min:97.5 F (36.4 C), Max:98 F (36.7 C)  Recent Labs  Lab 01/23/24 1103 01/23/24 1105  WBC  --  12.0*  CREATININE  --  1.74*  LATICACIDVEN 1.4  --     Estimated Creatinine Clearance: 29 mL/min (A) (by C-G formula based on SCr of 1.74 mg/dL (H)).    No Known Allergies  Antimicrobials this admission: 6/8 cefepime  6/8 vancomycin   Dose adjustments this admission:   Microbiology results: 6/8 blood x2  Thank you for allowing pharmacy to be a part of this patient's care.   Baxter Limber, PharmD Clinical Pharmacist **Pharmacist phone directory can now be found on [email or web address] ([email or web address]).  Listed under [facility].

## 2024-01-23 NOTE — Progress Notes (Signed)
 Echocardiogram 2D Echocardiogram has been performed.  Frank Meyer 01/23/2024, 4:56 PM

## 2024-01-23 NOTE — H&P (Signed)
 NAME:  Frank Meyer, MRN:  [id number], DOB:  Mar 15, 1941, LOS: 0 ADMISSION DATE:  01/23/2024, CONSULTATION DATE:  6/8 REFERRING MD:  Dr. Manus Sellers - EDP, CHIEF COMPLAINT: Hypotension  History of Present Illness:  Frank Meyer is a 83 year old male with an extensive past medical history significant for but not limited to HFrEF, chronic hypoxic respiratory failure on 6 L home oxygen, HTN, HLD, COPD, history of prior seizures, and prior TIA who presented to med [facility] after suffering fall at home due to tripping over her oxygen tubing.  Spouse notes patient was hypotensive morning of fall.  On ED arrival patient was seen hypotensive, mildly hypothermic, and intermittently tachypneic.  Lab work significant for creatinine 1.74 GFR 38, albumin  3.3, high-sensitivity troponin of 447, WBC 12.0.  Vasopressor support started and recommendations made for ED to ED transfer for further care and management under PCCM.  Pertinent  Medical History  HFrEF, chronic hypoxic respiratory failure on 6 L home oxygen, HTN, HLD, COPD, history of prior seizures, and prior TIA  Significant Hospital Events: Including procedures, antibiotic start and stop dates in addition to other pertinent events   6/8 presented after ground-level hypotensive  Interim History / Subjective:  Seen sitting up in bed with no acute complaints   Objective    Blood pressure (!) 81/56, pulse 80, temperature (!) 97.5 F (36.4 C), temperature source Oral, resp. rate 19, height 5\' 6"  (1.676 m), weight 73.4 kg, SpO2 100%.        Intake/Output Summary (Last 24 hours) at 01/23/2024 1415 Last data filed at 01/23/2024 1259 Gross per 24 hour  Intake 413.37 ml  Output --  Net 413.37 ml   Filed Weights   01/23/24 1046  Weight: 73.4 kg    Examination: General: Well appearing elderly male sitting up in bed, in NAD HEENT: [place]/AT, MM pink/moist, PERRL,  Neuro: Alert and oriented x3, non-focal  CV: s1s2 regular rate and rhythm, no  murmur, rubs, or gallops,  PULM:  Clear to auscultation, no increased work of breathing  GI: soft, bowel sounds active in all 4 quadrants, non-tender, non-distended  Extremities: warm/dry, no edema  Skin: no rashes or lesions  Resolved problem list   Assessment and Plan   Hypotension unable to rule out sepsis/infection fully  - Blood pressure on arrival 71/52 with MAP of 67. Clinical exam and history appears consistent with mild dehydration in the setting of multiple cardiac meds. However patient has mild leukocytosis with hypothermia on admit  P: Continue Empiric Cefepime  and Vancomycin   Trend CBC and fever curve  Continue pressors for MAP goal > 65 Follow ECHO  Gentle IV hydration  Optimize nutrition as able  Assess home medication regiment   HFrEF - Echocardiogram January 2025 with EF 50 to 55%, no WMA, normal RV function Essential hypertension Hyperlipidemia -Home medications include aspirin , Lipitor, Entresto , Proscar , Lasix  P: Continuous telemetry  ASA and statin Heart health diet with sodium restriction when able  Strict intake and output  Daily weight to assess volume status Daily assessment for need to diurese Closely monitor renal function and electrolytes  Provide supplemtal oxygen as needed  AKI superimposed on 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 -Creatinine 1.74 with GFR 38 on admission compared to Creatinine 1.38 with GFR 51 March 2025 P: Follow renal function  Monitor urine output Trend Bmet Avoid nephrotoxins Ensure adequate renal perfusion  Gentle IV hydration  Chronic hypoxic respiratory failure on 6 L home oxygen COPD P: Continue supplemental oxygen for sat  goal greater than 92 aspiration precautions Encourage pulmonary hygiene Once medically stable mobilize  History of prior seizures Prior TIA - Head CT on admission negative P: Maintain neuro protective measures Nutrition and bowel regiment  Aspirations precautions   Constipation  P: Bowel regiment    Best Practice (right click and "Reselect all SmartList Selections" daily)   Diet/type: Regular consistency (see orders) DVT prophylaxis prophylactic heparin   Pressure ulcer(s): N/A GI prophylaxis: PPI Lines: N/A Foley:  N/A Code Status:  DNR Last date of multidisciplinary goals of care discussion: Update patient and family daily   Labs   CBC: Recent Labs  Lab 01/23/24 1105 01/23/24 1111  WBC 12.0*  --   NEUTROABS 7.8*  --   HGB 10.7* 10.9*  HCT 33.5* 32.0*  MCV 82.3  --   PLT 354  --     Basic Metabolic Panel: Recent Labs  Lab 01/23/24 1105 01/23/24 1111  NA 137 136  K 4.2 4.0  CL 100  --   CO2 26  --   GLUCOSE 111*  --   BUN 24*  --   CREATININE 1.74*  --   CALCIUM  9.0  --    GFR: Estimated Creatinine Clearance: 29 mL/min (A) (by C-G formula based on SCr of 1.74 mg/dL (H)). Recent Labs  Lab 01/23/24 1103 01/23/24 1105  WBC  --  12.0*  LATICACIDVEN 1.4  --     Liver Function Tests: Recent Labs  Lab 01/23/24 1105  AST 32  ALT 17  ALKPHOS 71  BILITOT 0.8  PROT 6.4*  ALBUMIN  3.3*   No results for input(s): "LIPASE", "AMYLASE" in the last 168 hours. No results for input(s): "AMMONIA" in the last 168 hours.  ABG    Component Value Date/Time   PHART 7.402 07/20/2022 1415   PCO2ART 37.0 07/20/2022 1415   PO2ART 75 (L) 07/20/2022 1415   HCO3 28.4 (H) 01/23/2024 1111   TCO2 30 01/23/2024 1111   ACIDBASEDEF 6.0 (H) 08/02/2022 2043   O2SAT 56 01/23/2024 1111     Coagulation Profile: Recent Labs  Lab 01/23/24 1105  INR 1.1    Cardiac Enzymes: No results for input(s): "CKTOTAL", "CKMB", "CKMBINDEX", "TROPONINI" in the last 168 hours.  HbA1C: No results found for: "HGBA1C"  CBG: No results for input(s): "GLUCAP" in the last 168 hours.  Review of Systems:   Please see the history of present illness. All other systems reviewed and are negative    Past Medical History:  He,  has a past medical history of AKI (acute kidney injury)  (HCC) (08/03/2022), Allergic rhinitis (10/24/2009), Altered bowel habits (10/04/2020), Basal cell carcinoma, Benign prostatic hyperplasia with lower urinary tract symptoms (10/24/2009), Benign prostatic hypertrophy, Bradycardia (12/06/2019), Callosity (10/16/2013), Cardiomegaly (01/23/2015), Chronic diastolic heart failure (HCC) (29/52/8413), Chronic respiratory failure (HCC) (10/04/2020), Coronary artery calcification seen on CAT scan (05/28/2020), Dyslipidemia, Dyspnea on exertion (03/18/2018), Elevated troponin (08/03/2022), Emphysema lung (HCC) (03/18/2018), Encounter for general adult medical examination without abnormal findings (01/17/2015), Epistaxis (09/18/2016), Ex-cigarette smoker (01/20/2021), Exudative age-related macular degeneration of left eye with active choroidal neovascularization (HCC) (12/18/2019), Gallbladder problem, Gastroesophageal reflux disease, Hiatal hernia, Hilar adenopathy (03/25/2018), History of seizures (08/03/2022), History of smoking (03/18/2018), History of transient ischemic attack (TIA) (10/24/2009), Hyperlipidemia (10/24/2009), Insomnia (05/09/2013), Intermediate stage nonexudative age-related macular degeneration of right eye (07/30/2020), Internal hemorrhoid (10/04/2020), Macular degeneration, Macular pucker, right eye (04/30/2020), Mediastinal adenopathy (03/25/2018), Memory loss (05/09/2013), 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), Mild memory disturbance, 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), Noninfective gastroenteritis and colitis, unspecified (10/04/2020),  Nuclear sclerotic cataract of right eye (12/18/2019), Other specified symptoms and signs involving the digestive system and abdomen (10/04/2020), Overweight (09/14/2018), Oxygen dependent (10/04/2020), 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   Surgical History:   Past Surgical History:  Procedure Laterality Date   CHOLECYSTECTOMY     none       Social History:   reports that he quit smoking about 31 years ago. His smoking use included cigarettes. He started smoking about 56 years ago. He has a 50 pack-year smoking history. He has never used smokeless tobacco. He reports current alcohol use of about 15.0 - 20.0 standard drinks of alcohol per week. He reports that he does not use drugs.   Family History:  His family history includes Memory loss in his mother; Obesity in his mother.   Allergies No Known Allergies   Home Medications  Prior to Admission medications   Medication Sig Start Date End Date Taking? Authorizing Provider  albuterol  (VENTOLIN  HFA) 108 (90 Base) MCG/ACT inhaler Inhale 1-2 puffs into the lungs every 4 (four) hours as needed for wheezing or shortness of breath.    [provider]  ANORO ELLIPTA  62.5-25 MCG/ACT AEPB USE 1 INHALATION BY MOUTH ONCE  DAILY AT THE SAME TIME EACH DAY 01/21/24   Hunsucker, Archer Kobs, MD  aspirin  81 MG EC tablet Take 81 mg by mouth daily.    [provider]  atorvastatin  (LIPITOR) 20 MG tablet Take 20 mg by mouth every evening.    [provider]  cetirizine (ZYRTEC) 10 MG tablet Take 10 mg by mouth daily.    [provider]  cholecalciferol  (VITAMIN D ) 1000 UNITS tablet Take 1,000 Units by mouth daily.    [provider]  donepezil  (ARICEPT ) 10 MG tablet TAKE 1 TABLET BY MOUTH DAILY 08/30/23   Camara, Amadou, MD  ENTRESTO  24-26 MG TAKE 1 TABLET BY MOUTH DAILY 06/18/23   Krasowski, Robert J, MD  finasteride  (PROSCAR ) 5 MG tablet Take 5 mg by mouth at bedtime.    [provider]  furosemide  (LASIX ) 40 MG tablet Take 1 tablet (40 mg total) by mouth every other day. Patient taking differently: Take 40 mg by mouth daily. 10/31/22   Paige, Victoria J, DO  ipratropium (ATROVENT ) 0.06 % nasal spray Place 1-2 sprays into both nostrils See admin instructions. 2 sprays in the right nostril and 1 spray in the left nostril once daily 02/09/13   [provider]  ipratropium-albuterol  (DUONEB) 0.5-2.5 (3) MG/3ML SOLN Take 3 mLs by nebulization every 6 (six) hours as needed. 01/11/24   Wilfredo Hanly, MD  levETIRAcetam  (KEPPRA ) 250 MG tablet TAKE 1 TABLET BY MOUTH IN THE  MORNING AND 2 TABLETS AT NIGHT Patient taking differently: Take 250-500 mg by mouth See admin instructions. Take one tablet by mouth in the morning and take two tablets by mouth in the evening daily. 10/06/23   Camara, Amadou, MD  Melatonin 10 MG TABS Take 20 mg by mouth at bedtime.    [provider]  nitroGLYCERIN  (NITROSTAT ) 0.4 MG SL tablet Place 1 tablet (0.4 mg total) under the tongue every 5 (five) minutes as needed. Chest pain Patient taking differently: Place 0.4 mg under the tongue  every 5 (five) minutes as needed for chest pain. Chest pain 07/15/21 11/12/23  Krasowski, Robert J, MD  omeprazole (PRILOSEC) 40 MG capsule Take 40 mg by mouth daily.  03/29/13   [provider]  OXYGEN Inhale 2-5 L into the lungs as directed. 5lt during the day and 2lt at night    [provider]  thiamine  100 MG tablet Take 1 tablet (100 mg total) by mouth daily. 12/13/18   Vann, Jessica U, DO  traZODone  (DESYREL ) 100 MG tablet TAKE 2 TABLETS BY MOUTH AT  BEDTIME 08/02/23   Cassandra Cleveland, MD     Critical care time:   CRITICAL CARE Performed by: Kalila Adkison D. Harris   Total critical care time: 38 minutes  Critical care time was exclusive of separately billable procedures and treating other patients.  Critical care was necessary to treat or prevent imminent or life-threatening  deterioration.  Critical care was time spent personally by me on the following activities: development of treatment plan with patient and/or surrogate as well as nursing, discussions with consultants, evaluation of patient's response to treatment, examination of patient, obtaining history from patient or surrogate, ordering and performing treatments and interventions, ordering and review of laboratory studies, ordering and review of radiographic studies, pulse oximetry and re-evaluation of patient's condition.  Jevaughn Degollado D. Harris, NP-C [facility] Personal contact information can be found on Amion  If no contact or response made please call [phone] 01/23/2024, 4:59 PM

## 2024-01-24 LAB — CBC
HCT: 30.3 % — ABNORMAL LOW (ref 39.0–52.0)
Hemoglobin: 9.6 g/dL — ABNORMAL LOW (ref 13.0–17.0)
MCH: 26.6 pg (ref 26.0–34.0)
MCHC: 31.7 g/dL (ref 30.0–36.0)
MCV: 83.9 fL (ref 80.0–100.0)
Platelets: 289 10*3/uL (ref 150–400)
RBC: 3.61 MIL/uL — ABNORMAL LOW (ref 4.22–5.81)
RDW: 13.5 % (ref 11.5–15.5)
WBC: 9.5 10*3/uL (ref 4.0–10.5)
nRBC: 0 % (ref 0.0–0.2)

## 2024-01-24 LAB — BASIC METABOLIC PANEL WITH GFR
Anion gap: 8 (ref 5–15)
BUN: 17 mg/dL (ref 8–23)
CO2: 24 mmol/L (ref 22–32)
Calcium: 8.2 mg/dL — ABNORMAL LOW (ref 8.9–10.3)
Chloride: 106 mmol/L (ref 98–111)
Creatinine, Ser: 1.05 mg/dL (ref 0.61–1.24)
GFR, Estimated: >60 mL/min (ref >60)
Glucose, Bld: 96 mg/dL (ref 70–99)
Potassium: 3.9 mmol/L (ref 3.5–5.1)
Sodium: 138 mmol/L (ref 135–145)

## 2024-01-24 LAB — MAGNESIUM: Magnesium: 2 mg/dL (ref 1.7–2.4)

## 2024-01-24 LAB — PHOSPHORUS: Phosphorus: 2.9 mg/dL (ref 2.5–4.6)

## 2024-01-24 MED ORDER — ADULT MULTIVITAMIN W/MINERALS CH
1.0000 | ORAL_TABLET | Freq: Every day | ORAL | Status: DC
  Administered 2024-01-24 – 2024-01-26 (×3): 1 via ORAL
  Filled 2024-01-24 (×3): qty 1

## 2024-01-24 MED ORDER — ENSURE PLUS HIGH PROTEIN PO LIQD
237.0000 mL | Freq: Two times a day (BID) | ORAL | Status: DC
  Administered 2024-01-24 – 2024-01-26 (×3): 237 mL via ORAL

## 2024-01-24 MED ORDER — CHLORHEXIDINE GLUCONATE CLOTH 2 % EX PADS
6.0000 | MEDICATED_PAD | Freq: Every day | CUTANEOUS | Status: DC
  Administered 2024-01-24 – 2024-01-26 (×2): 6 via TOPICAL

## 2024-01-24 MED ORDER — ADULT MULTIVITAMIN W/MINERALS CH: 1.0000 | ORAL_TABLET | Freq: Every day | ORAL | Status: DC

## 2024-01-24 MED ORDER — SODIUM CHLORIDE 0.9 % IV SOLN
2.0000 g | INTRAVENOUS | Status: DC
  Administered 2024-01-24 – 2024-01-26 (×3): 2 g via INTRAVENOUS
  Filled 2024-01-24 (×3): qty 20

## 2024-01-24 NOTE — Evaluation (Signed)
 Physical Therapy Brief Evaluation and Discharge Note Patient Details Name: Frank Meyer MRN: [id number] DOB: 28-Dec-1940 Today's Date: 01/24/2024   History of Present Illness  83 yo male admitted 01/23/24 after fall tripping over O2 tubing with hypotension and shock. PMhx:chronic respiratory failure with hypoxia due to COPD requiring 6L, CHF, memory loss, seizures, insomnia, HLD  Clinical Impression  Pt pleasant and lives with wife, wears 5L at home and up to 15L when he goes to the gym for seated exercises. He does maintain O2 at 8L for bathing and monitors SPO2 regularly. Pt reports no other falls and denied benefit of bedside urinal as he states nighttime toileting is when he most frequently gets tripped up. Pt moving well and at baseline functional status despite lack of sleep. Pt does not require further therapy and will defer to mobility acutely with pt in agreement.        PT Assessment Patient does not need any further PT services  Assistance Needed at Discharge  PRN    Equipment Recommendations None recommended by PT  Recommendations for Other Services       Precautions/Restrictions Precautions Precautions: Fall;Other (comment) Recall of Precautions/Restrictions: Intact Precaution/Restrictions Comments: watch SPO2        Mobility  Bed Mobility Rolling: Modified independent (Device/Increase time)        Transfers Overall transfer level: Modified independent                      Ambulation/Gait Ambulation/Gait assistance: Modified independent (Device/Increase time) Gait Distance (Feet): 150 Feet Assistive device: None Gait Pattern/deviations: Step-through pattern, Decreased stride length, Trunk flexed Gait Speed: Pace WFL General Gait Details: pt on 6L O2 with SPO2 desaturation to 86% after 125' without symptoms  Home Activity Instructions    Stairs            Modified [name] (Stroke Patients Only)        Balance Overall balance  assessment: Mild deficits observed, not formally tested                        Pertinent Vitals/Pain PT - Brief Vital Signs All Vital Signs Stable: Other (comment) (pt on 5L at rest and 6L during gait with SPO2 86-94% with activity) Pain Assessment Pain Assessment: No/denies pain     Home Living Family/patient expects to be discharged to:: Private residence Living Arrangements: Spouse/significant other Available Help at Discharge: Family;Available 24 hours/day Home Environment: Level entry   Home Equipment: None        Prior Function Level of Independence: Independent Comments: lives at home with wife, 24' O2 tubing on condenser that he gets tangled in at times but no other falls. Goes to the gym 2x/wk and sits on recumbent bike or UB machines with 12-15L O2 to maintain 90% during activity. Wife does the IADLs    UE/LE Assessment   UE ROM/Strength/Tone/Coordination: WFL    LE ROM/Strength/Tone/Coordination: [facility]      Communication   Communication Communication: No apparent difficulties     Cognition Overall Cognitive Status: Appears within functional limits for tasks assessed/performed       General Comments      Exercises     Assessment/Plan    PT Problem List         PT Visit Diagnosis Other abnormalities of gait and mobility (R26.89)    No Skilled PT All education completed;Patient is modified independent with all activity/mobility;Patient will have necessary level of assist  by caregiver at discharge   Co-evaluation                AMPAC 6 Clicks Help needed turning from your back to your side while in a flat bed without using bedrails?: None Help needed moving from lying on your back to sitting on the side of a flat bed without using bedrails?: None Help needed moving to and from a bed to a chair (including a wheelchair)?: A Little Help needed standing up from a chair using your arms (e.g., wheelchair or bedside chair)?: A Little Help  needed to walk in hospital room?: A Little Help needed climbing 3-5 steps with a railing? : A Little 6 Click Score: 20      End of Session Equipment Utilized During Treatment: Oxygen Activity Tolerance: Patient tolerated treatment well Patient left: in chair;with call bell/phone within reach;with chair alarm set Nurse Communication: Mobility status PT Visit Diagnosis: Other abnormalities of gait and mobility (R26.89)     Time: 1610-9604 PT Time Calculation (min) (ACUTE ONLY): 19 min  Charges:   PT Evaluation $PT Eval Low Complexity: 1 Low      Deronte Solis P, PT Acute Rehabilitation Services Office: [phone]   Fitzroy Mikami B Sujey Gundry  01/24/2024, 9:30 AM

## 2024-01-24 NOTE — Progress Notes (Signed)
   NAME:  Frank Meyer, MRN:  [id number], DOB:  11-08-1940, LOS: 1 ADMISSION DATE:  01/23/2024, CONSULTATION DATE:  6/8 REFERRING MD:  Dr. Manus Sellers - EDP, CHIEF COMPLAINT: Hypotension  History of Present Illness:  Frank Meyer is a 83 year old male with an extensive past medical history significant for but not limited to HFrEF, chronic hypoxic respiratory failure on 6 L home oxygen, HTN, HLD, COPD, history of prior seizures, and prior TIA who presented to med [facility] after suffering fall at home due to tripping over her oxygen tubing.  Spouse notes patient was hypotensive morning of fall.  On ED arrival patient was seen hypotensive, mildly hypothermic, and intermittently tachypneic.  Lab work significant for creatinine 1.74 GFR 38, albumin  3.3, high-sensitivity troponin of 447, WBC 12.0.  Vasopressor support started and recommendations made for ED to ED transfer for further care and management under PCCM.  Pertinent  Medical History  HFrEF, chronic hypoxic respiratory failure on 6 L home oxygen, HTN, HLD, COPD, history of prior seizures, and prior TIA  Significant Hospital Events: Including procedures, antibiotic start and stop dates in addition to other pertinent events   6/8 presented after ground-level fall with suspected sepsis of unclear source.  Interim History / Subjective:  Has weaned off vasopressors.  No new voiced complaints.  Objective    Blood pressure 92/63, pulse 87, temperature 98.4 F (36.9 C), temperature source Oral, resp. rate 13, height 5\' 6"  (1.676 m), weight 73 kg, SpO2 99%.        Intake/Output Summary (Last 24 hours) at 01/24/2024 1012 Last data filed at 01/24/2024 0800 Gross per 24 hour  Intake 3164.7 ml  Output 800 ml  Net 2364.7 ml   Filed Weights   01/23/24 1046 01/23/24 1610 01/24/24 0500  Weight: 73.4 kg 73.1 kg 73 kg    Examination: General: Frail but well-appearing man in no distress. HEENT: Moist mucous membranes. Neuro: Sensorium  is clear.  No focal deficits. CV: Normal heart sounds. PULM: Decreased air entry with crackles at the right base. GI: Abdomen is soft and nontender. Extremities: warm/dry, no edema  Skin: no rashes or lesions  Ancillary test personally reviewed:  Leukocytosis has resolved.  Mild stable anemia. AKI has resolved.  Creatinine is normalized.  Assessment and Plan   Septic shock secondary to pneumonia.  His shock now resolved. HFrEF Severe COPD on home oxygen. 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  Ready to transfer out of ICU. - PT consult, progressive ambulation.  Advance diet. - Complete 5-day course for community-acquired pneumonia. - Continue oxygen at home dose. -Continue bronchodilators. - Resume home anticonvulsants, Aricept . - Should be ready to discharge home once ambulation back to baseline.  Best Practice (right click and "Reselect all SmartList Selections" daily)   Diet/type: Regular consistency (see orders) DVT prophylaxis prophylactic heparin   Pressure ulcer(s): N/A GI prophylaxis: PPI Lines: N/A Foley:  N/A Code Status:  DNR Last date of multidisciplinary goals of care discussion: Update patient and family daily   Arlina Lair, MD [facility] ICU Physician [facility] [facility]  Pager: [phone] Or Epic Secure Chat After hours: [phone].  01/24/2024, 10:19 AM

## 2024-01-24 NOTE — Evaluation (Signed)
 Occupational Therapy Evaluation Patient Details Name: Frank Meyer MRN: [id number] DOB: 09/24/1940 Today's Date: 01/24/2024   History of Present Illness   83 yo male admitted 01/23/24 after fall tripping over O2 tubing with hypotension and shock. PMhx:chronic respiratory failure with hypoxia due to COPD requiring 6L, CHF, memory loss, seizures, insomnia, HLD     Clinical Impressions Pt reports ind at baseline with ADLs/functional mobility, lives with spouse. Pt currently feels close to baseline, states he wears baseline 2.5L at night, 5-6L O2, increases to 8L for showers, and up to 10-15L for walking to mailbox, going to gym, etc. Pt needing overall CGA for ADLs, ind with bed mobility and supervision for transfers without AD. Pt needing up to 10L for ambulation around unit, desatted to low 80s on 8L but denies symptoms. Pt returned to bed and VSS on 6L O2. Pt presenting with impairments listed below, will follow acutely. Anticipate no OT follow up needs at d/c.      If plan is discharge home, recommend the following:   A little help with walking and/or transfers;A little help with bathing/dressing/bathroom;Assist for transportation     Functional Status Assessment   Patient has had a recent decline in their functional status and demonstrates the ability to make significant improvements in function in a reasonable and predictable amount of time.     Equipment Recommendations   None recommended by OT     Recommendations for Other Services         Precautions/Restrictions   Precautions Precautions: Fall;Other (comment) Recall of Precautions/Restrictions: Intact Precaution/Restrictions Comments: watch SPO2 Restrictions Weight Bearing Restrictions Per Provider Order: No     Mobility Bed Mobility Overal bed mobility: Independent                  Transfers Overall transfer level: Needs assistance Equipment used: None Transfers: Sit to/from Stand Sit to  Stand: Supervision                  Balance Overall balance assessment: No apparent balance deficits (not formally assessed)                                         ADL either performed or assessed with clinical judgement   ADL Overall ADL's : Needs assistance/impaired Eating/Feeding: Set up;Sitting   Grooming: Set up;Sitting   Upper Body Bathing: Contact guard assist;Sitting;Standing   Lower Body Bathing: Contact guard assist;Sitting/lateral leans   Upper Body Dressing : Contact guard assist;Standing;Sitting   Lower Body Dressing: Contact guard assist;Sitting/lateral leans;Sit to/from stand   Toilet Transfer: Ambulation;Supervision/safety           Functional mobility during ADLs: Supervision/safety       Vision   Vision Assessment?: No apparent visual deficits     Perception Perception: Not tested       Praxis Praxis: Not tested       Pertinent Vitals/Pain Pain Assessment Pain Assessment: No/denies pain     Extremity/Trunk Assessment Upper Extremity Assessment Upper Extremity Assessment: Generalized weakness   Lower Extremity Assessment Lower Extremity Assessment: Defer to PT evaluation   Cervical / Trunk Assessment Cervical / Trunk Assessment: Normal   Communication Communication Communication: No apparent difficulties   Cognition Arousal: Alert Behavior During Therapy: WFL for tasks assessed/performed Cognition: No apparent impairments  Following commands: Intact       Cueing  General Comments   Cueing Techniques: Verbal cues  SpO2 down to 80% on 8L with mobility, needed up to 10L for mobility   Exercises     Shoulder Instructions      Home Living Family/patient expects to be discharged to:: Private residence Living Arrangements: Spouse/significant other Available Help at Discharge: Family;Available 24 hours/day Type of Home: House Home Access: Stairs to  enter [employer] of Steps: 1   Home Layout: Two level;Able to live on main level with bedroom/bathroom     Bathroom Shower/Tub: Chief Strategy Officer: Handicapped height     Home Equipment: None   Additional Comments: 2.5L O2 at night 5-6 during the day, increases to 8L for showers      Prior Functioning/Environment Prior Level of Function : Independent/Modified Independent;Driving             Mobility Comments: no AD ADLs Comments: ind    OT Problem List: Cardiopulmonary status limiting activity;Decreased activity tolerance   OT Treatment/Interventions: Self-care/ADL training;Therapeutic exercise;Energy conservation;DME and/or AE instruction;Therapeutic activities;Patient/family education;Balance training      OT Goals(Current goals can be found in the care plan section)   Acute Rehab OT Goals Patient Stated Goal: none stated OT Goal Formulation: With patient Time For Goal Achievement: 02/07/24 Potential to Achieve Goals: Good ADL Goals Pt Will Perform Lower Body Dressing: Independently;sitting/lateral leans;sit to/from stand Pt Will Perform Tub/Shower Transfer: Tub transfer;Shower transfer;Independently;ambulating Additional ADL Goal #1: pt will verbalize x3 energy conservation strategies in prep for ADLs   OT Frequency:  Min 1X/week    Co-evaluation              AM-PAC OT "6 Clicks" Daily Activity     Outcome Measure Help from another person eating meals?: None Help from another person taking care of personal grooming?: A Little Help from another person toileting, which includes using toliet, bedpan, or urinal?: A Little Help from another person bathing (including washing, rinsing, drying)?: A Little Help from another person to put on and taking off regular upper body clothing?: A Little Help from another person to put on and taking off regular lower body clothing?: A Little 6 Click Score: 19   End of Session Equipment  Utilized During Treatment: Oxygen Nurse Communication: Mobility status  Activity Tolerance: Patient tolerated treatment well Patient left: in bed;with call bell/phone within reach;with family/visitor present  OT Visit Diagnosis: Muscle weakness (generalized) (M62.81)                Time: 1610-9604 OT Time Calculation (min): 19 min Charges:  OT General Charges $OT Visit: 1 Visit OT Evaluation $OT Eval Low Complexity: 1 Low  Kawena Lyday K, OTD, OTR/L SecureChat Preferred Acute Rehab [phone]  Tayler Heiden K Koonce 01/24/2024, 1:43 PM

## 2024-01-24 NOTE — Plan of Care (Signed)

## 2024-01-24 NOTE — Progress Notes (Addendum)
 Initial Nutrition Assessment  DOCUMENTATION CODES:   Non-severe (moderate) malnutrition in context of chronic illness  INTERVENTION:  Continue regular diet MVI with minerals daily Ensure Enlive po BID, each supplement provides 350 kcal and 20 grams of protein.   NUTRITION DIAGNOSIS:  Moderate Malnutrition related to chronic illness as evidenced by mild fat depletion, severe muscle depletion.  GOAL:  Patient will meet greater than or equal to 90% of their needs  MONITOR:  PO intake, Supplement acceptance, Labs, Weight trends  REASON FOR ASSESSMENT:  Consult Assessment of nutrition requirement/status, Poor PO  ASSESSMENT:  Pt with hx of COPD on home O2, CHF, GERD, and HLD presented to ED after a fall at home. Pt hypotensive at home after fall and found to have AKI and shock.  Pt resting in bed at the time of assessment. States that appetite is better today than yesterday, but that he has had decreased appetite for months. Pt reports that he still eats a normal sized breakfast (cereal with granola and raisins added) 1/2 the amount he used to eat for lunch (will now eat soup or a sandwich not both like it used to be) and an even smaller amount of his dinner. Pt reports that he doesn't feel like getting up to go get items he used to enjoying eating and drinking - mood related versus hunger related   Discussed his recent weight loss as well which was noted over the last 6 months which seems to coincide with his decline in appetite. Pt also reports that his COPD has progressed and he doesn't leave home much anymore as he has to turn his O2 up higher than his portable O2 will allow him to. Pt may benefit from a condenser or motorized wheelchair to allow for more freedom and ability to do activities outside the home.   Pt agreeable to ensure. States he has been drinking premier protein the last few months when his appetite began to decrease.  Muscle and fat deficits present on exam  consistent with malnutrition.  Admit weight: 73.4 kg  Current weight: 73 kg  7% weight loss noted over the last 6 months (07/2023-01/2024). Not severe but concerning due to age and comorbidities   Intake/Output Summary (Last 24 hours) at 01/24/2024 1338 Last data filed at 01/24/2024 1100 Gross per 24 hour  Intake 2871.33 ml  Output 975 ml  Net 1896.33 ml  Net IO Since Admission: 2,309.7 mL [01/24/24 1338] UOP: since admission  Nutritionally Relevant Medications: Scheduled Meds:  atorvastatin   20 mg Oral QPM   cholecalciferol   1,000 Units Oral Daily   pantoprazole   40 mg Oral Daily   thiamine   100 mg Oral Daily   Continuous Infusions:  ceFEPime  (MAXIPIME ) IV Stopped (01/24/24 0012)   norepinephrine (LEVOPHED) Adult infusion Stopped (01/23/24 1856)   [START ON 01/25/2024] vancomycin      PRN Meds:.docusate sodium , polyethylene glycol  Labs Reviewed: CBG ranges from 96-107 mg/dL over the last 24 hours  NUTRITION - FOCUSED PHYSICAL EXAM: Flowsheet Row Most Recent Value  Orbital Region Mild depletion  Upper Arm Region Mild depletion  Thoracic and Lumbar Region Moderate depletion  Buccal Region Mild depletion  Temple Region No depletion  Clavicle Bone Region Moderate depletion  Clavicle and Acromion Bone Region Mild depletion  Scapular Bone Region Mild depletion  Dorsal Hand Severe depletion  Patellar Region Severe depletion  Anterior Thigh Region Severe depletion  Posterior Calf Region Moderate depletion  Edema (RD Assessment) Mild  Hair Reviewed  Eyes Reviewed  Mouth Reviewed  Skin Reviewed  Nails Reviewed    Diet Order:   Diet Order             Diet regular Room service appropriate? Yes; Fluid consistency: Thin  Diet effective now                   EDUCATION NEEDS:  Education needs have been addressed  Skin:  Skin Assessment: Reviewed RN Assessment  Last BM:  PTA  Height:  Ht Readings from Last 1 Encounters:  01/23/24 5\' 6"  (1.676 m)     Weight:  Wt Readings from Last 1 Encounters:  01/24/24 73 kg    Ideal Body Weight:  64.5 kg  BMI:  Body mass index is 25.98 kg/m.  Estimated Nutritional Needs:  Kcal:  1600-1800 kcal/d Protein:  80-95g/d Fluid:  1.8L/d    Edwena Graham, RD, LDN Registered Dietitian II Please reach out via secure chat

## 2024-01-24 NOTE — TOC CM/SW Note (Signed)
 Transition of Care [facility]) - Inpatient Brief Assessment   Patient Details  Name: Frank Meyer MRN: [id number] Date of Birth: [birth year]  Transition of Care [facility]) CM/SW Contact:    Juliane Och, LCSW Phone Number: 01/24/2024, 9:48 AM   Clinical Narrative:  9:48 AM Per chart review, patient resides at home with spouse. Patient has a PCP and insurance. Patient does not have SNF history. Patient has HH history with [facility]. Patient has home oxygen/nebulzier history with [employer]. Patient's preferred pharmacy's are [facility] [address] and [facility]. No TOC needs were identified at this time. TOC will continue to follow and be available to assist.  Transition of Care Asessment: Insurance and Status: Insurance coverage has been reviewed Patient has primary care physician: Yes Home environment has been reviewed: Private Residence Prior level of function:: Independent Prior/Current Home Services: No current home services (Has HH/DME history) Social Drivers of Health Review: SDOH reviewed no interventions necessary Readmission risk has been reviewed: Yes Transition of care needs: no transition of care needs at this time

## 2024-01-25 DIAGNOSIS — E440 Moderate protein-calorie malnutrition: Secondary | ICD-10-CM | POA: Insufficient documentation

## 2024-01-25 DIAGNOSIS — I959 Hypotension, unspecified: Secondary | ICD-10-CM | POA: Diagnosis not present

## 2024-01-25 MED ORDER — DIPHENHYDRAMINE-ZINC ACETATE 2-0.1 % EX CREA
TOPICAL_CREAM | Freq: Two times a day (BID) | CUTANEOUS | Status: DC | PRN
  Filled 2024-01-25: qty 28

## 2024-01-25 MED ORDER — TRAZODONE HCL 50 MG PO TABS
50.0000 mg | ORAL_TABLET | Freq: Every day | ORAL | Status: DC
  Administered 2024-01-25: 50 mg via ORAL
  Filled 2024-01-25: qty 1

## 2024-01-25 MED ORDER — DIPHENHYDRAMINE HCL 25 MG PO CAPS
25.0000 mg | ORAL_CAPSULE | Freq: Three times a day (TID) | ORAL | Status: DC | PRN
  Administered 2024-01-25: 25 mg via ORAL
  Filled 2024-01-25: qty 1

## 2024-01-25 MED ORDER — DIPHENHYDRAMINE HCL 50 MG/ML IJ SOLN: 25.0000 mg | Freq: Three times a day (TID) | INTRAMUSCULAR | Status: DC | PRN

## 2024-01-25 NOTE — Progress Notes (Signed)
 Mobility Specialist Progress Note:   01/25/24 1000  Oxygen Therapy  O2 Device Nasal Cannula  O2 Flow Rate (L/min) 5 L/min  Mobility  Activity Ambulated with assistance in hallway  Level of Assistance Contact guard assist, steadying assist  Assistive Device None  Distance Ambulated (ft) 150 ft  Activity Response Tolerated well  Mobility Referral Yes  Mobility visit 1 Mobility  Mobility Specialist Start Time (ACUTE ONLY) 0932  Mobility Specialist Stop Time (ACUTE ONLY) 0947  Mobility Specialist Time Calculation (min) (ACUTE ONLY) 15 min    Pre Mobility:  91% SpO2 Post Mobility: 92% SpO2  Received pt in bed having no complaints and agreeable to mobility. Pt was asymptomatic throughout ambulation and returned to room w/o fault. Left in chair w/ call bell in reach and all needs met. Chair alarm on.   D'Vante Nolon Baxter Mobility Specialist Please contact via Special educational needs teacher or Rehab office at [phone]

## 2024-01-25 NOTE — Progress Notes (Signed)
 Progress Note   Patient: Frank Meyer GNF:[id number] DOB: Jun 03, 1941 DOA: 01/23/2024     2 DOS: the patient was seen and examined on 01/25/2024   Brief hospital course: Frank Meyer is a 83 year old male with an extensive past medical history significant for chronic HFrEF, chronic hypoxic respiratory failure on 6 L home oxygen, HTN, HLD, COPD, history of prior seizures, and prior TIA who presented to [facility] after suffering fall at home due to tripping over his oxygen tubing.  Spouse notes patient was hypotensive morning of fall.    In the ED, patient was hypotensive, mildly hypothermic, and intermittently tachypneic.  Lab work significant for creatinine 1.74 GFR 38, albumin  3.3, high-sensitivity troponin of 447, WBC 12.0.  Vasopressor support started and patient was transferred for admission to [facility], on PCCM service.  Patient was weaned off pressors, did not require intubation. TRH service assumed care on 01/25/24.    Assessment and Plan:  Septic shock due to pneumonia -- shock resolved Severe COPD Acute on Chronic respiratory failure with hypoxia O2 requirements today are stable on his baseline O2. 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 --Monitor renal function --Avoid nephrotoxins  Chronic HFrEF - appears euvolemic Echo done 01/23/24 - EF ~55%, indeterminate diastolic parameters, severely reduced RV systolic function, small pericardial effusion without signs of tamponade, mild MR --Monitor volume status --Entresto  on hold due to septic shock and AKI - resume when appropriate. BP's remain soft today but MAP stable   Essential  Hypertension - currently with soft BP, recently resolved septic shock --Hold Entresto   Hyperlipidemia --Continue Lipitor  History of TIA --Continue Lipitor  History of seizures --Continue Keppra       Subjective: Pt up in recliner this AM. Reports feeling much better and hoping to go home soon.  He is agreeable to stay another night so we can be sure his O2 and respiratory status remains improved and stable.  He has itchiness from a rash on his back, he atributes to being in bed, requesting benadryl .  Reports no sleep in 4 days.  Otherwise no acute complaints including cough, SOB or F/C.  Physical Exam: Vitals:   01/24/24 1947 01/24/24 2100 01/25/24 0435 01/25/24 0810  BP:  (!) 103/55 108/65 99/77  Pulse:  87 81 91  Resp:  15 16 16   Temp:  98.2 F (36.8 C) 98.7 F (37.1 C) 98.3 F (36.8 C)  TempSrc:  Oral Oral   SpO2: 91% 93% 92% 94%  Weight:      Height:       General exam: awake, alert, no acute distress HEENT: moist mucus membranes, hearing grossly normal  Respiratory system: CTAB diminished throughout, no wheezes, rales or rhonchi, normal respiratory effort at rest on 5 L/min [place] O2 Cardiovascular system: normal S1/S2, RRR, no JVD, murmurs, rubs, gallops, no pedal edema.   Gastrointestinal system: soft, NT, ND, no HSM felt, +bowel sounds. Central nervous system: A&O x3. no gross focal neurologic deficits, normal speech Extremities: moves all , no edema, normal tone Skin: scattered erythematous papules on back  but not elsewhere Psychiatry: normal mood, congruent affect, judgement and insight appear normal  Data Reviewed:  Notable labs --  Normal BMP except Ca 8.2 Cr improved 1.50 >> 1.05 Hbg stable 9.9 >> 9.6  Family Communication: None present on rounds  Disposition: Status is: Inpatient Remains inpatient appropriate because: warrants another 24 hours close monitoring for stability of O2 needs, respiratory status, improvement in BP   Planned Discharge  Destination: Home    Time spent: 45 minutes  Author: Montey Apa, DO 01/25/2024 2:09 PM  For on call review [email or web address].

## 2024-01-25 NOTE — Plan of Care (Signed)

## 2024-01-26 DIAGNOSIS — J9621 Acute and chronic respiratory failure with hypoxia: Secondary | ICD-10-CM

## 2024-01-26 LAB — BASIC METABOLIC PANEL WITH GFR
Anion gap: 6 (ref 5–15)
BUN: 9 mg/dL (ref 8–23)
CO2: 27 mmol/L (ref 22–32)
Calcium: 7.6 mg/dL — ABNORMAL LOW (ref 8.9–10.3)
Chloride: 99 mmol/L (ref 98–111)
Creatinine, Ser: 0.85 mg/dL (ref 0.61–1.24)
GFR, Estimated: >60 mL/min (ref >60)
Glucose, Bld: 133 mg/dL — ABNORMAL HIGH (ref 70–99)
Potassium: 3.6 mmol/L (ref 3.5–5.1)
Sodium: 132 mmol/L — ABNORMAL LOW (ref 135–145)

## 2024-01-26 MED ORDER — AZITHROMYCIN 500 MG PO TABS: 500.0000 mg | ORAL_TABLET | Freq: Every day | ORAL | 0 refills | Status: AC

## 2024-01-26 MED ORDER — AMOXICILLIN-POT CLAVULANATE 875-125 MG PO TABS: 1.0000 | ORAL_TABLET | Freq: Two times a day (BID) | ORAL | 0 refills | Status: DC

## 2024-01-26 MED ORDER — POLYETHYLENE GLYCOL 3350 17 G PO PACK: 17.0000 g | PACK | Freq: Every day | ORAL | Status: DC

## 2024-01-26 MED ORDER — TRAZODONE HCL 100 MG PO TABS: 100.0000 mg | ORAL_TABLET | Freq: Every day | ORAL | Status: DC

## 2024-01-26 MED ORDER — IPRATROPIUM-ALBUTEROL 0.5-2.5 (3) MG/3ML IN SOLN: 3.0000 mL | Freq: Four times a day (QID) | RESPIRATORY_TRACT | Status: DC | PRN

## 2024-01-26 MED ORDER — LACTULOSE 10 GM/15ML PO SOLN
20.0000 g | Freq: Once | ORAL | Status: AC
  Administered 2024-01-26: 20 g via ORAL
  Filled 2024-01-26: qty 30

## 2024-01-26 MED ORDER — POLYETHYLENE GLYCOL 3350 17 G PO PACK
17.0000 g | PACK | Freq: Every day | ORAL | Status: DC
  Administered 2024-01-26: 17 g via ORAL
  Filled 2024-01-26: qty 1

## 2024-01-26 NOTE — Discharge Instructions (Addendum)
 Please hold your lasix  and entresto  until you see your PCP or cardiologist because of your low blood pressures.   Please use the following scale to help guide when you will use lasix :     Please reduce your trazodone  to 100mg  nightly.   Please continue to monitor your blood pressure daily, if it is persistently below but greater than ( the top number) please call your primary care doctor if you do not have any symptoms. If you are light headed, dizzy, have chest pain or develop any of the symptoms below please go to the emergency room, irrespective of what your blood pressure is.   You have a fast or uneven heartbeat. You lose feeling (have numbness) in any part of your body. You cannot move your arms or your legs. You have trouble talking. You get sweaty or feel light-headed. You faint. You have trouble breathing. You have trouble staying awake. You feel mixed up (confused)

## 2024-01-26 NOTE — Progress Notes (Signed)
 Frank Meyer to be D/C'd Home per MD order.  Discussed with the patient and all questions fully answered.  VSS, Skin clean, dry and intact without evidence of skin break down, no evidence of skin tears noted. IV catheter discontinued intact. Site without signs and symptoms of complications. Dressing and pressure applied.  An After Visit Summary was printed and given to the patient. Patient prescriptions sent to this pharmacy.  D/c education completed with patient/family including follow up instructions, medication list, d/c activities limitations if indicated, with other d/c instructions as indicated by MD - patient able to verbalize understanding, all questions fully answered.   Patient instructed to return to ED, call 911, or call MD for any changes in condition.   Patient escorted via WC, and D/C home via private auto.  Frank Meyer 01/26/2024 6:13 PM

## 2024-01-26 NOTE — Progress Notes (Addendum)
 Occupational Therapy Treatment Patient Details Name: Frank Meyer MRN: [id number] DOB: December 05, 1940 Today's Date: 01/26/2024   History of present illness 83 yo male admitted 01/23/24 after fall tripping over O2 tubing with hypotension and shock. PMhx:chronic respiratory failure with hypoxia due to COPD requiring 6L, CHF, memory loss, seizures, insomnia, HLD   OT comments  Pt progressing well towards goals. Progessed to complete functional mobility ~235ft at supervision level. Destat to 81% on 8L, increased to 10L to recover. Toileting and standing grooming completed at s for safety level. Educated on energy conservation and compensatory strategies to promote safety upon d/c. Continue to recommend No follow up OT. Will continue to follow acutely.      If plan is discharge home, recommend the following:  A little help with walking and/or transfers;A little help with bathing/dressing/bathroom;Assist for transportation   Equipment Recommendations  None recommended by OT    Recommendations for Other Services      Precautions / Restrictions Precautions Precautions: Fall;Other (comment) Recall of Precautions/Restrictions: Intact Precaution/Restrictions Comments: watch SPO2 Restrictions Weight Bearing Restrictions Per Provider Order: No       Mobility Bed Mobility Overal bed mobility: Independent        Transfers Overall transfer level: Needs assistance Equipment used: None Transfers: Sit to/from Stand, Bed to chair/wheelchair/BSC Sit to Stand: Supervision     Step pivot transfers: Supervision     General transfer comment: No AD     Balance Overall balance assessment: No apparent balance deficits (not formally assessed)       ADL either performed or assessed with clinical judgement   ADL Overall ADL's : Needs assistance/impaired     Grooming: Wash/dry hands;Supervision/safety       Toilet Transfer: Ambulation;Supervision/safety   Toileting- Clothing  Manipulation and Hygiene: Supervision/safety;Sitting/lateral lean       Functional mobility during ADLs: Supervision/safety General ADL Comments: educated on compensatory and energy conservation strategies in home    Extremity/Trunk Assessment Upper Extremity Assessment Upper Extremity Assessment: Generalized weakness   Lower Extremity Assessment Lower Extremity Assessment: Defer to PT evaluation        Vision   Vision Assessment?: No apparent visual deficits         Communication Communication Communication: No apparent difficulties   Cognition Arousal: Alert Behavior During Therapy: WFL for tasks assessed/performed Cognition: No apparent impairments     Following commands: Intact        Cueing   Cueing Techniques: Verbal cues        General Comments Pt destat to 81% on 8L during mobility, increased to 10 L to recover. Access Code: [email or web address]  URL: [email or web address]  Date: 01/26/2024  Prepared by: Charls Cooks    Patient Education  - Understanding Energy Conservation  - Energy Conservation During Daily Tasks    Pertinent Vitals/ Pain       Pain Assessment Pain Assessment: No/denies pain   Frequency  Min 1X/week        Progress Toward Goals  OT Goals(current goals can now be found in the care plan section)  Progress towards OT goals: Progressing toward goals  Acute Rehab OT Goals Patient Stated Goal: To walk OT Goal Formulation: With patient Time For Goal Achievement: 02/07/24 Potential to Achieve Goals: Good ADL Goals Pt Will Perform Lower Body Dressing: Independently;sitting/lateral leans;sit to/from stand Pt Will Perform Tub/Shower Transfer: Tub transfer;Shower transfer;Independently;ambulating Additional ADL Goal #1: pt will verbalize x3 energy conservation strategies in prep for ADLs  Plan  AM-PAC OT 6 Clicks Daily Activity     Outcome Measure   Help from another person eating meals?: None Help from another  person taking care of personal grooming?: A Little Help from another person toileting, which includes using toliet, bedpan, or urinal?: A Little Help from another person bathing (including washing, rinsing, drying)?: A Little Help from another person to put on and taking off regular upper body clothing?: A Little Help from another person to put on and taking off regular lower body clothing?: A Little 6 Click Score: 19    End of Session Equipment Utilized During Treatment: Oxygen  OT Visit Diagnosis: Muscle weakness (generalized) (M62.81)   Activity Tolerance Patient tolerated treatment well   Patient Left in bed;with call bell/phone within reach;with family/visitor present   Nurse Communication Mobility status        Time: 6962-9528 OT Time Calculation (min): 26 min  Charges: OT General Charges $OT Visit: 1 Visit OT Treatments $Self Care/Home Management : 23-37 mins  Delmer Ferraris, OT  Acute Rehabilitation Services Office [phone] Secure chat preferred   Mickael Alamo 01/26/2024, 4:09 PM

## 2024-01-26 NOTE — Hospital Course (Signed)
 Noted to have severely reduced RV function

## 2024-01-26 NOTE — Care Management Important Message (Signed)
 Important Message  Patient Details  Name: Frank Meyer MRN: [id number] Date of Birth: Dec 30, 1940   Important Message Given:  Yes - Medicare IM     [name] 01/26/2024, 12:03 PM

## 2024-01-27 LAB — CULTURE, BLOOD (ROUTINE X 2)
Culture: NO GROWTH
Culture: NO GROWTH
Special Requests: ADEQUATE

## 2024-01-27 NOTE — Discharge Summary (Signed)
 Physician Discharge Summary   Patient: Frank Meyer MRN: [id number] DOB: 1941-06-27  Admit date:     01/23/2024  Discharge date: 01/26/2024  Discharge Physician: Joette Mustard   PCP: Suan Elm, MD   Recommendations at discharge:    F/u w/ cardiology team when to resume lasix  and entresto  amd if additional therapy needed for severely reduced RV function noted on TTE   Discharge Diagnoses: Principal Problem:   Hypotension Active Problems:   Malnutrition of moderate degree  Resolved Problems:   * No resolved hospital problems. *  Hospital Course: Frank Meyer is a 83 year old male with an extensive past medical history significant for chronic HFrEF, chronic hypoxic respiratory failure on 6 L home oxygen, HTN, HLD, COPD, history of prior seizures, and prior TIA who presented to [facility] after suffering fall at home due to tripping over his oxygen tubing.  Spouse notes patient was hypotensive morning of fall.     In the ED, patient was hypotensive, mildly hypothermic, and intermittently tachypneic.  Lab work significant for creatinine 1.74 GFR 38, albumin  3.3, high-sensitivity troponin of 447, WBC 12.0.  Vasopressor support started and patient was transferred for admission to [facility], on PCCM service.   Patient was weaned off pressors, did not require intubation. TRH service assumed care on 01/25/24.  Assessment and Plan: Septic shock due to pneumonia  Shock resolved  Severe COPD  Acute on chronic respiratory failure with hypoxia   Patient was continued on antibiotics He will continue his home inhalers.  He was discharged at his BL O2 requirement.   AKI on CKD 3a  Resolved      Latest Ref Rng & Units 01/26/2024    8:32 AM 01/24/2024    3:03 AM 01/23/2024    6:39 PM  BMP  Glucose 70 - 99 mg/dL 191  96    BUN 8 - 23 mg/dL 9  17    Creatinine 4.78 - 1.24 mg/dL 2.95  6.21  3.08   Sodium 135 - 145 mmol/L 132  138    Potassium 3.5 - 5.1 mmol/L 3.6   3.9    Chloride 98 - 111 mmol/L 99  106    CO2 22 - 32 mmol/L 27  24    Calcium  8.9 - 10.3 mg/dL 7.6  8.2    This improved by discharge.   Mild hyponatremia  Possibly in the setting of poor oral intake. No further intervention was warranted.   HFimEF Appears euvolemic. ON low dose entresto  at home due to chronic hypotension.  Patient also on lasix  at home.  TTE 01/23/24 EF ~55%, indeterminate diastolic parameters, severely reduced RV systolic function, small pericardial effusion without signs of tamponade, mild MR   Patient was discharged with instructions for when to take lasix  if gaining weight. He will also schedule an appointment with his cardiologist. Entresto  was held on discharge given persistently low blood pressures.   HTN  Per patient and his wife he has chronic soft BP.  Entresto  was held on discharge.    HLD Noted  Statin continued   H/o TIA  Noted lipitor continued    H/o Seizures  No seizure activity this hospitalization.   Keppra  was continued         Consultants: None Procedures performed: None  Disposition: Home Diet recommendation:  Discharge Diet Orders (From admission, onward)     Start     Ordered   01/26/24 0000  Diet - low sodium heart healthy  01/26/24 1732           Cardiac diet DISCHARGE MEDICATION: Allergies as of 01/26/2024   No Known Allergies      Medication List     PAUSE taking these medications    Entresto  24-26 MG Wait to take this until your doctor or other care provider tells you to start again. Generic drug: sacubitril -valsartan  TAKE 1 TABLET BY MOUTH DAILY   furosemide  40 MG tablet Wait to take this until your doctor or other care provider tells you to start again. Commonly known as: LASIX  Take 1 tablet (40 mg total) by mouth every other day.       TAKE these medications    albuterol  108 (90 Base) MCG/ACT inhaler Commonly known as: VENTOLIN  HFA Inhale 1-2 puffs into the lungs every 4 (four)  hours as needed for wheezing or shortness of breath.   amoxicillin -clavulanate 875-125 MG tablet Commonly known as: AUGMENTIN  Take 1 tablet by mouth 2 (two) times daily for 3 days.   Anoro Ellipta  62.5-25 MCG/ACT Aepb Generic drug: umeclidinium-vilanterol USE 1 INHALATION BY MOUTH ONCE  DAILY AT THE SAME TIME EACH DAY What changed: See the new instructions.   aspirin  EC 81 MG tablet Take 81 mg by mouth daily.   atorvastatin  20 MG tablet Commonly known as: LIPITOR Take 20 mg by mouth every evening.   azithromycin  500 MG tablet Commonly known as: Zithromax  Take 1 tablet (500 mg total) by mouth daily for 3 days.   cetirizine 10 MG tablet Commonly known as: ZYRTEC Take 10 mg by mouth at bedtime.   cholecalciferol  1000 units tablet Commonly known as: VITAMIN D  Take 1,000 Units by mouth daily.   donepezil  10 MG tablet Commonly known as: ARICEPT  TAKE 1 TABLET BY MOUTH DAILY What changed: when to take this   ferrous sulfate 325 (65 FE) MG EC tablet Take 325 mg by mouth daily with breakfast.   finasteride  5 MG tablet Commonly known as: PROSCAR  Take 5 mg by mouth at bedtime.   ipratropium 0.06 % nasal spray Commonly known as: ATROVENT  Place 1-2 sprays into both nostrils 4 (four) times daily as needed for rhinitis (runny nose). 2 sprays in the right nostril and 1 spray in the left nostril once daily   ipratropium-albuterol  0.5-2.5 (3) MG/3ML Soln Commonly known as: DUONEB Take 3 mLs by nebulization every 6 (six) hours as needed. What changed: when to take this   levETIRAcetam  250 MG tablet Commonly known as: KEPPRA  TAKE 1 TABLET BY MOUTH IN THE  MORNING AND 2 TABLETS AT NIGHT What changed: See the new instructions.   Melatonin 10 MG Tabs Take 10 mg by mouth at bedtime.   nitroGLYCERIN  0.4 MG SL tablet Commonly known as: NITROSTAT  Place 1 tablet (0.4 mg total) under the tongue every 5 (five) minutes as needed. Chest pain What changed: reasons to take this    OXYGEN Inhale 2-5 L/min into the lungs continuous. 5 lpm during the day and 2.5 lpm at night   pantoprazole  40 MG tablet Commonly known as: PROTONIX  Take 40 mg by mouth every morning.   polyethylene glycol 17 g packet Commonly known as: MIRALAX  / GLYCOLAX  Take 17 g by mouth daily.   thiamine  100 MG tablet Commonly known as: VITAMIN B1 Take 1 tablet (100 mg total) by mouth daily.   traZODone  100 MG tablet Commonly known as: DESYREL  Take 1 tablet (100 mg total) by mouth at bedtime. What changed: how much to take  Follow-up Information     Suan Elm, MD Follow up in 7 day(s).   Specialty: Internal Medicine Contact information: [address] [place] [address] [phone]         Manfred Seed, MD. Schedule an appointment as soon as possible for a visit in 7 day(s).   Specialty: Cardiology Contact information: [address] [place] [address] [phone]                Discharge Exam: Cleavon Curls Weights   01/23/24 1046 01/23/24 1610 01/24/24 0500  Weight: 73.4 kg 73.1 kg 73 kg   Physical Exam  Constitutional: In no distress.  Cardiovascular: Normal rate, regular rhythm. No lower extremity edema  Pulmonary: Non labored breathing on 4L [place], no wheezing or rales.   Abdominal: Soft. Normal bowel sounds. Non distended and non tender Musculoskeletal: Normal range of motion.     Neurological: Alert and oriented to person, place, and time. Non focal  Skin: Skin is warm and dry.    Condition at discharge: stable  The results of significant diagnostics from this hospitalization (including imaging, microbiology, ancillary and laboratory) are listed below for reference.   Imaging Studies: ECHOCARDIOGRAM COMPLETE Result Date: 01/23/2024    ECHOCARDIOGRAM REPORT   Patient Name:   Agatha Alcon Date of Exam: 01/23/2024 Medical Rec #:  [id number]          Height:       66.0 in Accession #:    [id number]         Weight:       161.2 lb Date  of Birth:  02-12-1941           BSA:          1.824 m Patient Age:    83 years           BP:           86/57 mmHg Patient Gender: M                  HR:           75 bpm. Exam Location:  Inpatient Procedure: 2D Echo, Cardiac Doppler and Color Doppler (Both Spectral and Color            Flow Doppler were utilized during procedure). Indications:    Abnormal ECG R94.31  History:        Patient has prior history of Echocardiogram examinations, most                 recent 09/14/2023. CHF, Pulmonary HTN and COPD,                 Signs/Symptoms:Hypotension; Risk Factors:Dyslipidemia.  Sonographer:    Terrilee Few RCS Referring Phys: Craige Dixon IMPRESSIONS  1. Left ventricular ejection fraction, by estimation, is approximately 55%. There is mild concentric left ventricular hypertrophy. Left ventricular diastolic parameters are indeterminate. There is the interventricular septum is flattened in systole and diastole, consistent with right ventricular pressure and volume overload.  2. Right ventricular systolic function is severely reduced. The right ventricular size is moderately enlarged. Tricuspid regurgitation signal is inadequate for assessing PA pressure. The estimated right ventricular systolic pressure is 30.2 mmHg.  3. A small pericardial effusion is present. The pericardial effusion is posterior to the left ventricle. There is no evidence of cardiac tamponade.  4. The mitral valve is degenerative. Mild mitral valve regurgitation.  5. The aortic valve is tricuspid. There is mild calcification of  the aortic valve. Aortic valve regurgitation is mild. Aortic valve sclerosis/calcification is present, without any evidence of aortic stenosis.  6. Aortic dilatation noted. There is mild dilatation of the ascending aorta, measuring 39 mm.  7. The inferior vena cava is normal in size with greater than 50% respiratory variability, suggesting right atrial pressure of 3 mmHg. FINDINGS  Left Ventricle: Left ventricular  ejection fraction, by estimation, is 55%. The left ventricle has normal function. The left ventricle has no regional wall motion abnormalities. The left ventricular internal cavity size was normal in size. There is mild concentric left ventricular hypertrophy. The interventricular septum is flattened in systole and diastole, consistent with right ventricular pressure and volume overload. Left ventricular diastolic parameters are indeterminate. Right Ventricle: The right ventricular size is moderately enlarged. No increase in right ventricular wall thickness. Right ventricular systolic function is severely reduced. Tricuspid regurgitation signal is inadequate for assessing PA pressure. The tricuspid regurgitant velocity is 2.61 m/s, and with an assumed right atrial pressure of 3 mmHg, the estimated right ventricular systolic pressure is 30.2 mmHg. Left Atrium: Left atrial size was normal in size. Right Atrium: Right atrial size was normal in size. Pericardium: A small pericardial effusion is present. The pericardial effusion is posterior to the left ventricle. There is no evidence of cardiac tamponade. Mitral Valve: The mitral valve is degenerative in appearance. There is mild thickening of the mitral valve leaflet(s). Mild mitral valve regurgitation, with posteriorly-directed jet. Tricuspid Valve: The tricuspid valve is grossly normal. Tricuspid valve regurgitation is mild. Aortic Valve: The aortic valve is tricuspid. There is mild calcification of the aortic valve. There is mild aortic valve annular calcification. Aortic valve regurgitation is mild. Aortic valve sclerosis/calcification is present, without any evidence of aortic stenosis. Aortic valve peak gradient measures 6.1 mmHg. Pulmonic Valve: The pulmonic valve was grossly normal. Pulmonic valve regurgitation is trivial. Aorta: Aortic dilatation noted. There is mild dilatation of the ascending aorta, measuring 39 mm. Venous: The inferior vena cava is normal  in size with greater than 50% respiratory variability, suggesting right atrial pressure of 3 mmHg. IAS/Shunts: No atrial level shunt detected by color flow Doppler. Additional Comments: 3D was performed not requiring image post processing on an independent workstation and was indeterminate.  LEFT VENTRICLE PLAX 2D LVIDd:         4.20 cm   Diastology LVIDs:         2.90 cm   LV e' medial:    5.44 cm/s LV PW:         1.20 cm   LV E/e' medial:  11.3 LV IVS:        1.10 cm   LV e' lateral:   9.68 cm/s LVOT diam:     2.30 cm   LV E/e' lateral: 6.3 LV SV:         72 LV SV Index:   39 LVOT Area:     4.15 cm  RIGHT VENTRICLE             IVC RV S prime:     11.30 cm/s  IVC diam: 2.10 cm TAPSE (M-mode): 1.9 cm LEFT ATRIUM             Index        RIGHT ATRIUM           Index LA diam:        4.00 cm 2.19 cm/m   RA Area:     19.90 cm LA Vol (A2C):  50.7 ml 27.79 ml/m  RA Volume:   49.40 ml  27.08 ml/m LA Vol (A4C):   51.9 ml 28.45 ml/m LA Biplane Vol: 52.2 ml 28.61 ml/m  AORTIC VALVE AV Area (Vmax): 2.97 cm AV Vmax:        123.00 cm/s AV Peak Grad:   6.1 mmHg LVOT Vmax:      87.87 cm/s LVOT Vmean:     58.333 cm/s LVOT VTI:       0.172 m  AORTA Ao Root diam: 3.70 cm Ao Asc diam:  3.90 cm MITRAL VALVE               TRICUSPID VALVE MV Area (PHT): 3.21 cm    TR Peak grad:   27.2 mmHg MV Decel Time: 236 msec    TR Vmax:        261.00 cm/s MV E velocity: 61.30 cm/s MV A velocity: 58.20 cm/s  SHUNTS MV E/A ratio:  1.05        Systemic VTI:  0.17 m                            Systemic Diam: 2.30 cm Teddie Favre MD Electronically signed by Teddie Favre MD Signature Date/Time: 01/23/2024/7:31:28 PM    Final    CT Head Wo Contrast Result Date: 01/23/2024 CLINICAL DATA:  Trip and fall last night EXAM: CT HEAD WITHOUT CONTRAST TECHNIQUE: Contiguous axial images were obtained from the base of the skull through the vertex without intravenous contrast. RADIATION DOSE REDUCTION: This exam was performed according to the  departmental dose-optimization program which includes automated exposure control, adjustment of the mA and/or kV according to patient size and/or use of iterative reconstruction technique. COMPARISON:  11/06/2021 FINDINGS: Brain: No evidence of acute infarction, hemorrhage, hydrocephalus, extra-axial collection or mass lesion/mass effect. Vascular: No hyperdense vessel or unexpected calcification. Skull: Normal. Negative for fracture or focal lesion. Sinuses/Orbits: No acute finding. Other: None. IMPRESSION: No acute intracranial pathology. Electronically Signed   By: Fredricka Jenny M.D.   On: 01/23/2024 13:24   DG Chest Portable 1 View Result Date: 01/23/2024 CLINICAL DATA:  Marvell Slider.  Shortness of breath. EXAM: PORTABLE CHEST 1 VIEW COMPARISON:  11/12/2023 FINDINGS: The heart is mildly enlarged but appears stable. The mediastinal and hilar contours are stable. 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 Persistent dense right apical pleural and parenchymal disease. No definite acute overlying pulmonary process. The bony thorax is intact. IMPRESSION: 1. 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 2. Persistent dense right apical pleural and parenchymal disease. 3. No definite acute overlying pulmonary process. Electronically Signed   By: Marrian Siva M.D.   On: 01/23/2024 11:32   Intravitreal Injection, Pharmacologic Agent - OS - Left Eye Result Date: 01/04/2024 Time Out 01/04/2024. 11:28 AM. Confirmed correct patient, procedure, site, and patient consented. Anesthesia Topical anesthesia was used. Anesthetic medications included Lidocaine 2%, Proparacaine 0.5%. Procedure Preparation included 5% betadine to ocular surface, eyelid speculum. A supplied (32g) needle was used. Injection: 1.25 mg Bevacizumab  1.25mg /0.56ml   Route: Intravitreal, Site: Left Eye   NDC: [id number], Lot: [id number], Expiration date: 02/04/2024 Post-op Post  injection exam found visual acuity of at least counting fingers. The patient tolerated the procedure well. There were no complications. The patient received written and verbal post procedure care education.   OCT, Retina - OU - Both Eyes Result Date: 01/04/2024 Right Eye Quality was borderline.  Central Foveal Thickness: 306. Progression has been stable. Findings include normal foveal contour, no SRF, retinal drusen , intraretinal fluid, outer retinal atrophy (Focal ORA temporal macula, no fluid or edema, trace cystic changes vs schisis nasal mac). Left Eye Quality was borderline. Central Foveal Thickness: 300. Progression has been stable. Findings include no IRF, no SRF, abnormal foveal contour, retinal drusen , subretinal hyper-reflective material, pigment epithelial detachment (Trace, persistent SRHM / edema overlying low PED nasal fovea/macula). Notes *Images captured and stored on drive Diagnosis / Impression: OD: Focal ORA temporal macula; no fluid or edema; trace cystic changes vs schisis nasal mac OS: trace persistent SRHM / edema overlying low PED nasal fovea/macula Clinical management: See below Abbreviations: NFP - Normal foveal profile. CME - cystoid macular edema. PED - pigment epithelial detachment. IRF - intraretinal fluid. SRF - subretinal fluid. EZ - ellipsoid zone. ERM - epiretinal membrane. ORA - outer retinal atrophy. ORT - outer retinal tubulation. SRHM - subretinal hyper-reflective material. IRHM - intraretinal hyper-reflective material    Microbiology: Results for orders placed or performed during the hospital encounter of 01/23/24  Resp panel by RT-PCR (RSV, Flu A&B, Covid) Anterior Nasal Swab     Status: None   Collection Time: 01/23/24 10:49 AM   Specimen: Anterior Nasal Swab  Result Value Ref Range Status   SARS Coronavirus 2 by RT PCR NEGATIVE NEGATIVE Final    Comment: (NOTE) SARS-CoV-2 target nucleic acids are NOT DETECTED.  The SARS-CoV-2 RNA is generally detectable in  upper respiratory specimens during the acute phase of infection. The lowest concentration of SARS-CoV-2 viral copies this assay can detect is 138 copies/mL. A negative result does not preclude SARS-Cov-2 infection and should not be used as the sole basis for treatment or other patient management decisions. A negative result may occur with  improper specimen collection/handling, submission of specimen other than nasopharyngeal swab, presence of viral mutation(s) within the areas targeted by this assay, and inadequate number of viral copies(<138 copies/mL). A negative result must be combined with clinical observations, patient history, and epidemiological information. The expected result is Negative.  Fact Sheet for Patients:  BloggerCourse.com  Fact Sheet for Healthcare Providers:  SeriousBroker.it  This test is no t yet approved or cleared by the [place]  FDA and  has been authorized for detection and/or diagnosis of SARS-CoV-2 by FDA under an Emergency Use Authorization (EUA). This EUA will remain  in effect (meaning this test can be used) for the duration of the COVID-19 declaration under Section 564(b)(1) of the Act, 21 U.S.C.section 360bbb-3(b)(1), unless the authorization is terminated  or revoked sooner.       Influenza A by PCR NEGATIVE NEGATIVE Final   Influenza B by PCR NEGATIVE NEGATIVE Final    Comment: (NOTE) The Xpert Xpress SARS-CoV-2/FLU/RSV plus assay is intended as an aid in the diagnosis of influenza from Nasopharyngeal swab specimens and should not be used as a sole basis for treatment. Nasal washings and aspirates are unacceptable for Xpert Xpress SARS-CoV-2/FLU/RSV testing.  Fact Sheet for Patients: BloggerCourse.com  Fact Sheet for Healthcare Providers: SeriousBroker.it  This test is not yet approved or cleared by the [place]  FDA and has been  authorized for detection and/or diagnosis of SARS-CoV-2 by FDA under an Emergency Use Authorization (EUA). This EUA will remain in effect (meaning this test can be used) for the duration of the COVID-19 declaration under Section 564(b)(1) of the Act, 21 U.S.C. section 360bbb-3(b)(1), unless the authorization is terminated or revoked.  Resp Syncytial Virus by PCR NEGATIVE NEGATIVE Final    Comment: (NOTE) Fact Sheet for Patients: BloggerCourse.com  Fact Sheet for Healthcare Providers: SeriousBroker.it  This test is not yet approved or cleared by the [place]  FDA and has been authorized for detection and/or diagnosis of SARS-CoV-2 by FDA under an Emergency Use Authorization (EUA). This EUA will remain in effect (meaning this test can be used) for the duration of the COVID-19 declaration under Section 564(b)(1) of the Act, 21 U.S.C. section 360bbb-3(b)(1), unless the authorization is terminated or revoked.  Performed at [facility], [address] [place], [place] [address]   Blood culture (routine x 2)     Status: None (Preliminary result)   Collection Time: 01/23/24 11:04 AM   Specimen: BLOOD  Result Value Ref Range Status   Specimen Description   Final    BLOOD LEFT ANTECUBITAL Performed at [facility], [address] [place], [place] [address]    Special Requests   Final    BOTTLES DRAWN AEROBIC AND ANAEROBIC Blood Culture results may not be optimal due to an excessive volume of blood received in culture bottles Performed at [facility], [address] [place], [place] [address]    Culture   Final    NO GROWTH 4 DAYS Performed at [facility], [address] [place], [place] [address]    Report Status PENDING  Incomplete  Blood culture (routine x 2)     Status: None (Preliminary result)   Collection Time: 01/23/24 11:09 AM   Specimen: BLOOD  Result Value Ref Range  Status   Specimen Description   Final    BLOOD RIGHT ANTECUBITAL Performed at [facility], [address] [place], [place] [address]    Special Requests   Final    BOTTLES DRAWN AEROBIC AND ANAEROBIC Blood Culture adequate volume Performed at [facility], [address] [place], [place] [address]    Culture   Final    NO GROWTH 4 DAYS Performed at [facility], [address] [place], [place] [address]    Report Status PENDING  Incomplete  MRSA Next Gen by PCR, Nasal     Status: None   Collection Time: 01/23/24  4:18 PM   Specimen: Nasal Mucosa; Nasal Swab  Result Value Ref Range Status   MRSA by PCR Next Gen NOT DETECTED NOT DETECTED Final    Comment: (NOTE) The GeneXpert MRSA Assay (FDA approved for NASAL specimens only), is one component of a comprehensive MRSA colonization surveillance program. It is not intended to diagnose MRSA infection nor to guide or monitor treatment for MRSA infections. Test performance is not FDA approved in patients less than 97 years old. Performed at [facility], [address] [place], [place] [address]     Labs: CBC: Recent Labs  Lab 01/23/24 1105 01/23/24 1111 01/23/24 1839 01/24/24 0303  WBC 12.0*  --  8.9 9.5  NEUTROABS 7.8*  --   --   --   HGB 10.7* 10.9* 9.9* 9.6*  HCT 33.5* 32.0* 31.5* 30.3*  MCV 82.3  --  84.0 83.9  PLT 354  --  299 289   Basic Metabolic Panel: Recent Labs  Lab 01/23/24 1105 01/23/24 1111 01/23/24 1839 01/24/24 0303 01/26/24 0832  NA 137 136  --  138 132*  K 4.2 4.0  --  3.9 3.6  CL 100  --   --  106 99  CO2 26  --   --  24 27  GLUCOSE 111*  --   --  96 133*  BUN 24*  --   --  17 9  CREATININE 1.74*  --  1.50* 1.05 0.85  CALCIUM  9.0  --   --  8.2* 7.6*  MG  --   --   --  2.0  --   PHOS  --   --   --  2.9  --    Liver Function Tests: Recent Labs  Lab 01/23/24 1105  AST 32  ALT 17  ALKPHOS 71  BILITOT 0.8  PROT 6.4*  ALBUMIN  3.3*   CBG: Recent  Labs  Lab 01/23/24 1603  GLUCAP 107*    Discharge time spent: greater than 30 minutes.  Signed: Joette Mustard, MD [facility] 01/27/2024

## 2024-01-30 ENCOUNTER — Emergency Department (HOSPITAL_COMMUNITY)

## 2024-01-30 ENCOUNTER — Inpatient Hospital Stay (HOSPITAL_COMMUNITY)
Admission: EM | Admit: 2024-01-30 | Discharge: 2024-02-03 | DRG: 871 | Disposition: A | Discharge status: Hospice | Attending: Internal Medicine | Admitting: Internal Medicine

## 2024-01-30 ENCOUNTER — Encounter (HOSPITAL_COMMUNITY): Payer: Self-pay

## 2024-01-30 ENCOUNTER — Other Ambulatory Visit: Payer: Self-pay

## 2024-01-30 DIAGNOSIS — Z9981 Dependence on supplemental oxygen: Secondary | ICD-10-CM | POA: Diagnosis not present

## 2024-01-30 DIAGNOSIS — E440 Moderate protein-calorie malnutrition: Secondary | ICD-10-CM | POA: Diagnosis present

## 2024-01-30 DIAGNOSIS — A419 Sepsis, unspecified organism: Secondary | ICD-10-CM | POA: Diagnosis present

## 2024-01-30 DIAGNOSIS — R64 Cachexia: Secondary | ICD-10-CM | POA: Diagnosis present

## 2024-01-30 DIAGNOSIS — I272 Pulmonary hypertension, unspecified: Secondary | ICD-10-CM | POA: Diagnosis present

## 2024-01-30 DIAGNOSIS — H353221 Exudative age-related macular degeneration, left eye, with active choroidal neovascularization: Secondary | ICD-10-CM | POA: Diagnosis present

## 2024-01-30 DIAGNOSIS — J431 Panlobular emphysema: Secondary | ICD-10-CM | POA: Diagnosis present

## 2024-01-30 DIAGNOSIS — Z961 Presence of intraocular lens: Secondary | ICD-10-CM | POA: Diagnosis present

## 2024-01-30 DIAGNOSIS — Z1152 Encounter for screening for COVID-19: Secondary | ICD-10-CM | POA: Diagnosis not present

## 2024-01-30 DIAGNOSIS — J441 Chronic obstructive pulmonary disease with (acute) exacerbation: Secondary | ICD-10-CM | POA: Diagnosis present

## 2024-01-30 DIAGNOSIS — F0280 Dementia in other diseases classified elsewhere without behavioral disturbance: Secondary | ICD-10-CM | POA: Diagnosis present

## 2024-01-30 DIAGNOSIS — R54 Age-related physical debility: Secondary | ICD-10-CM | POA: Diagnosis present

## 2024-01-30 DIAGNOSIS — Z66 Do not resuscitate: Secondary | ICD-10-CM | POA: Diagnosis present

## 2024-01-30 DIAGNOSIS — R0602 Shortness of breath: Secondary | ICD-10-CM | POA: Diagnosis not present

## 2024-01-30 DIAGNOSIS — I5033 Acute on chronic diastolic (congestive) heart failure: Secondary | ICD-10-CM | POA: Diagnosis present

## 2024-01-30 DIAGNOSIS — Z8673 Personal history of transient ischemic attack (TIA), and cerebral infarction without residual deficits: Secondary | ICD-10-CM

## 2024-01-30 DIAGNOSIS — N179 Acute kidney failure, unspecified: Secondary | ICD-10-CM | POA: Diagnosis present

## 2024-01-30 DIAGNOSIS — R680 Hypothermia, not associated with low environmental temperature: Secondary | ICD-10-CM | POA: Diagnosis present

## 2024-01-30 DIAGNOSIS — Z87891 Personal history of nicotine dependence: Secondary | ICD-10-CM

## 2024-01-30 DIAGNOSIS — I130 Hypertensive heart and chronic kidney disease with heart failure and stage 1 through stage 4 chronic kidney disease, or unspecified chronic kidney disease: Secondary | ICD-10-CM | POA: Diagnosis present

## 2024-01-30 DIAGNOSIS — R0600 Dyspnea, unspecified: Secondary | ICD-10-CM | POA: Diagnosis not present

## 2024-01-30 DIAGNOSIS — G300 Alzheimer's disease with early onset: Secondary | ICD-10-CM | POA: Diagnosis present

## 2024-01-30 DIAGNOSIS — E43 Unspecified severe protein-calorie malnutrition: Secondary | ICD-10-CM | POA: Diagnosis present

## 2024-01-30 DIAGNOSIS — E860 Dehydration: Secondary | ICD-10-CM | POA: Diagnosis present

## 2024-01-30 DIAGNOSIS — I2510 Atherosclerotic heart disease of native coronary artery without angina pectoris: Secondary | ICD-10-CM | POA: Diagnosis present

## 2024-01-30 DIAGNOSIS — J9612 Chronic respiratory failure with hypercapnia: Secondary | ICD-10-CM | POA: Diagnosis present

## 2024-01-30 DIAGNOSIS — R569 Unspecified convulsions: Secondary | ICD-10-CM

## 2024-01-30 DIAGNOSIS — G4700 Insomnia, unspecified: Secondary | ICD-10-CM | POA: Diagnosis present

## 2024-01-30 DIAGNOSIS — Z789 Other specified health status: Secondary | ICD-10-CM | POA: Diagnosis not present

## 2024-01-30 DIAGNOSIS — J189 Pneumonia, unspecified organism: Secondary | ICD-10-CM | POA: Diagnosis present

## 2024-01-30 DIAGNOSIS — K2270 Barrett's esophagus without dysplasia: Secondary | ICD-10-CM | POA: Diagnosis present

## 2024-01-30 DIAGNOSIS — F32A Depression, unspecified: Secondary | ICD-10-CM | POA: Diagnosis present

## 2024-01-30 DIAGNOSIS — J9611 Chronic respiratory failure with hypoxia: Secondary | ICD-10-CM | POA: Diagnosis present

## 2024-01-30 DIAGNOSIS — J449 Chronic obstructive pulmonary disease, unspecified: Secondary | ICD-10-CM | POA: Diagnosis present

## 2024-01-30 DIAGNOSIS — E861 Hypovolemia: Secondary | ICD-10-CM | POA: Diagnosis not present

## 2024-01-30 DIAGNOSIS — J9621 Acute and chronic respiratory failure with hypoxia: Secondary | ICD-10-CM | POA: Diagnosis present

## 2024-01-30 DIAGNOSIS — Z79899 Other long term (current) drug therapy: Secondary | ICD-10-CM

## 2024-01-30 DIAGNOSIS — G40909 Epilepsy, unspecified, not intractable, without status epilepticus: Secondary | ICD-10-CM | POA: Diagnosis present

## 2024-01-30 DIAGNOSIS — Y92002 Bathroom of unspecified non-institutional (private) residence single-family (private) house as the place of occurrence of the external cause: Secondary | ICD-10-CM

## 2024-01-30 DIAGNOSIS — R55 Syncope and collapse: Secondary | ICD-10-CM | POA: Diagnosis present

## 2024-01-30 DIAGNOSIS — I5032 Chronic diastolic (congestive) heart failure: Secondary | ICD-10-CM | POA: Diagnosis present

## 2024-01-30 DIAGNOSIS — N1831 Chronic kidney disease, stage 3a: Secondary | ICD-10-CM | POA: Diagnosis present

## 2024-01-30 DIAGNOSIS — K219 Gastro-esophageal reflux disease without esophagitis: Secondary | ICD-10-CM | POA: Diagnosis present

## 2024-01-30 DIAGNOSIS — Z860100 Personal history of colon polyps, unspecified: Secondary | ICD-10-CM

## 2024-01-30 DIAGNOSIS — I959 Hypotension, unspecified: Secondary | ICD-10-CM | POA: Diagnosis present

## 2024-01-30 DIAGNOSIS — Z7189 Other specified counseling: Secondary | ICD-10-CM | POA: Diagnosis not present

## 2024-01-30 DIAGNOSIS — E785 Hyperlipidemia, unspecified: Secondary | ICD-10-CM | POA: Diagnosis present

## 2024-01-30 DIAGNOSIS — W1830XA Fall on same level, unspecified, initial encounter: Secondary | ICD-10-CM | POA: Diagnosis present

## 2024-01-30 DIAGNOSIS — G309 Alzheimer's disease, unspecified: Secondary | ICD-10-CM | POA: Diagnosis present

## 2024-01-30 DIAGNOSIS — I950 Idiopathic hypotension: Secondary | ICD-10-CM | POA: Diagnosis not present

## 2024-01-30 DIAGNOSIS — R230 Cyanosis: Secondary | ICD-10-CM | POA: Diagnosis not present

## 2024-01-30 DIAGNOSIS — Y95 Nosocomial condition: Principal | ICD-10-CM

## 2024-01-30 DIAGNOSIS — G3184 Mild cognitive impairment, so stated: Secondary | ICD-10-CM | POA: Diagnosis present

## 2024-01-30 DIAGNOSIS — Z6825 Body mass index (BMI) 25.0-25.9, adult: Secondary | ICD-10-CM

## 2024-01-30 DIAGNOSIS — Z85828 Personal history of other malignant neoplasm of skin: Secondary | ICD-10-CM

## 2024-01-30 DIAGNOSIS — Z7982 Long term (current) use of aspirin: Secondary | ICD-10-CM

## 2024-01-30 DIAGNOSIS — I951 Orthostatic hypotension: Secondary | ICD-10-CM | POA: Diagnosis not present

## 2024-01-30 DIAGNOSIS — Z515 Encounter for palliative care: Secondary | ICD-10-CM | POA: Diagnosis not present

## 2024-01-30 DIAGNOSIS — N400 Enlarged prostate without lower urinary tract symptoms: Secondary | ICD-10-CM | POA: Diagnosis present

## 2024-01-30 DIAGNOSIS — R296 Repeated falls: Secondary | ICD-10-CM | POA: Diagnosis present

## 2024-01-30 LAB — TSH: TSH: 1.29 u[IU]/mL (ref 0.350–4.500)

## 2024-01-30 LAB — COMPREHENSIVE METABOLIC PANEL WITH GFR
ALT: 40 U/L (ref 0–44)
AST: 39 U/L (ref 15–41)
Albumin: 2.4 g/dL — ABNORMAL LOW (ref 3.5–5.0)
Alkaline Phosphatase: 53 U/L (ref 38–126)
Anion gap: 10 (ref 5–15)
BUN: 19 mg/dL (ref 8–23)
CO2: 24 mmol/L (ref 22–32)
Calcium: 8.4 mg/dL — ABNORMAL LOW (ref 8.9–10.3)
Chloride: 101 mmol/L (ref 98–111)
Creatinine, Ser: 1.37 mg/dL — ABNORMAL HIGH (ref 0.61–1.24)
GFR, Estimated: 51 mL/min — ABNORMAL LOW (ref >60)
Glucose, Bld: 113 mg/dL — ABNORMAL HIGH (ref 70–99)
Potassium: 4.2 mmol/L (ref 3.5–5.1)
Sodium: 135 mmol/L (ref 135–145)
Total Bilirubin: 1 mg/dL (ref 0.0–1.2)
Total Protein: 6.1 g/dL — ABNORMAL LOW (ref 6.5–8.1)

## 2024-01-30 LAB — RESP PANEL BY RT-PCR (RSV, FLU A&B, COVID)  RVPGX2
Influenza A by PCR: NEGATIVE
Influenza B by PCR: NEGATIVE
Resp Syncytial Virus by PCR: NEGATIVE
SARS Coronavirus 2 by RT PCR: NEGATIVE

## 2024-01-30 LAB — CBC WITH DIFFERENTIAL/PLATELET
Abs Immature Granulocytes: 0.04 10*3/uL (ref 0.00–0.07)
Basophils Absolute: 0.1 10*3/uL (ref 0.0–0.1)
Basophils Relative: 1 %
Eosinophils Absolute: 0.2 10*3/uL (ref 0.0–0.5)
Eosinophils Relative: 1 %
HCT: 31.4 % — ABNORMAL LOW (ref 39.0–52.0)
Hemoglobin: 10 g/dL — ABNORMAL LOW (ref 13.0–17.0)
Immature Granulocytes: 0 %
Lymphocytes Relative: 22 %
Lymphs Abs: 2.4 10*3/uL (ref 0.7–4.0)
MCH: 26.2 pg (ref 26.0–34.0)
MCHC: 31.8 g/dL (ref 30.0–36.0)
MCV: 82.2 fL (ref 80.0–100.0)
Monocytes Absolute: 1.4 10*3/uL — ABNORMAL HIGH (ref 0.1–1.0)
Monocytes Relative: 12 %
Neutro Abs: 7.2 10*3/uL (ref 1.7–7.7)
Neutrophils Relative %: 64 %
Platelets: 397 10*3/uL (ref 150–400)
RBC: 3.82 MIL/uL — ABNORMAL LOW (ref 4.22–5.81)
RDW: 14 % (ref 11.5–15.5)
WBC: 11.3 10*3/uL — ABNORMAL HIGH (ref 4.0–10.5)
nRBC: 0 % (ref 0.0–0.2)

## 2024-01-30 LAB — PROTIME-INR
INR: 1.1 (ref 0.8–1.2)
Prothrombin Time: 14.3 s (ref 11.4–15.2)

## 2024-01-30 LAB — I-STAT CG4 LACTIC ACID, ED
Lactic Acid, Venous: 1.1 mmol/L (ref 0.5–1.9)
Lactic Acid, Venous: 0.9 mmol/L (ref 0.5–1.9)

## 2024-01-30 LAB — C-REACTIVE PROTEIN: CRP: 11.1 mg/dL — ABNORMAL HIGH (ref <1.0)

## 2024-01-30 LAB — T4, FREE: Free T4: 1.21 ng/dL — ABNORMAL HIGH (ref 0.61–1.12)

## 2024-01-30 LAB — PROCALCITONIN: Procalcitonin: 0.1 ng/mL

## 2024-01-30 MED ORDER — MIDODRINE HCL 5 MG PO TABS: 5.0000 mg | ORAL_TABLET | Freq: Three times a day (TID) | ORAL | Status: DC | PRN

## 2024-01-30 MED ORDER — ONDANSETRON HCL 4 MG/2ML IJ SOLN: 4.0000 mg | Freq: Four times a day (QID) | INTRAMUSCULAR | Status: DC | PRN

## 2024-01-30 MED ORDER — LACTATED RINGERS IV BOLUS
500.0000 mL | Freq: Once | INTRAVENOUS | Status: AC
  Administered 2024-01-30: 500 mL via INTRAVENOUS

## 2024-01-30 MED ORDER — LEVETIRACETAM 500 MG PO TABS
500.0000 mg | ORAL_TABLET | Freq: Every day | ORAL | Status: DC
  Administered 2024-01-30 – 2024-02-02 (×4): 500 mg via ORAL
  Filled 2024-01-30 (×4): qty 1

## 2024-01-30 MED ORDER — ACETAMINOPHEN 325 MG PO TABS: 650.0000 mg | ORAL_TABLET | Freq: Four times a day (QID) | ORAL | Status: DC | PRN

## 2024-01-30 MED ORDER — ACETAMINOPHEN 650 MG RE SUPP: 650.0000 mg | Freq: Four times a day (QID) | RECTAL | Status: DC | PRN

## 2024-01-30 MED ORDER — IPRATROPIUM-ALBUTEROL 0.5-2.5 (3) MG/3ML IN SOLN
3.0000 mL | Freq: Two times a day (BID) | RESPIRATORY_TRACT | Status: DC
  Administered 2024-01-31: 3 mL via RESPIRATORY_TRACT
  Filled 2024-01-30: qty 3

## 2024-01-30 MED ORDER — CEFEPIME HCL 2 G IV SOLR
2.0000 g | Freq: Once | INTRAVENOUS | Status: AC
  Administered 2024-01-30: 2 g via INTRAVENOUS
  Filled 2024-01-30: qty 12.5

## 2024-01-30 MED ORDER — POLYETHYLENE GLYCOL 3350 17 G PO PACK
17.0000 g | PACK | Freq: Every day | ORAL | Status: DC
  Administered 2024-01-31 – 2024-02-03 (×4): 17 g via ORAL
  Filled 2024-01-30 (×4): qty 1

## 2024-01-30 MED ORDER — ONDANSETRON HCL 4 MG PO TABS: 4.0000 mg | ORAL_TABLET | Freq: Four times a day (QID) | ORAL | Status: DC | PRN

## 2024-01-30 MED ORDER — LEVETIRACETAM 250 MG PO TABS
250.0000 mg | ORAL_TABLET | Freq: Every morning | ORAL | Status: DC
  Administered 2024-01-31 – 2024-02-03 (×4): 250 mg via ORAL
  Filled 2024-01-30 (×4): qty 1

## 2024-01-30 MED ORDER — ENOXAPARIN SODIUM 40 MG/0.4ML IJ SOSY
40.0000 mg | PREFILLED_SYRINGE | INTRAMUSCULAR | Status: DC
  Administered 2024-01-30 – 2024-02-01 (×3): 40 mg via SUBCUTANEOUS
  Filled 2024-01-30 (×3): qty 0.4

## 2024-01-30 MED ORDER — SODIUM CHLORIDE 0.9% FLUSH
3.0000 mL | Freq: Two times a day (BID) | INTRAVENOUS | Status: DC
  Administered 2024-01-30 – 2024-02-03 (×8): 3 mL via INTRAVENOUS

## 2024-01-30 MED ORDER — LACTATED RINGERS IV SOLN
INTRAVENOUS | Status: DC
  Administered 2024-01-30: 75 mL via INTRAVENOUS

## 2024-01-30 MED ORDER — UMECLIDINIUM-VILANTEROL 62.5-25 MCG/ACT IN AEPB
1.0000 | INHALATION_SPRAY | Freq: Every day | RESPIRATORY_TRACT | Status: DC
  Administered 2024-01-31 – 2024-02-03 (×4): 1 via RESPIRATORY_TRACT
  Filled 2024-01-30: qty 14

## 2024-01-30 MED ORDER — VANCOMYCIN HCL IN DEXTROSE 1-5 GM/200ML-% IV SOLN
1000.0000 mg | Freq: Once | INTRAVENOUS | Status: AC
  Administered 2024-01-30: 1000 mg via INTRAVENOUS
  Filled 2024-01-30: qty 200

## 2024-01-30 MED ORDER — PANTOPRAZOLE SODIUM 40 MG PO TBEC
40.0000 mg | DELAYED_RELEASE_TABLET | Freq: Every morning | ORAL | Status: DC
  Administered 2024-01-31 – 2024-02-03 (×4): 40 mg via ORAL
  Filled 2024-01-30 (×4): qty 1

## 2024-01-30 MED ORDER — ALBUTEROL SULFATE (2.5 MG/3ML) 0.083% IN NEBU: 2.5000 mg | INHALATION_SOLUTION | RESPIRATORY_TRACT | Status: DC | PRN

## 2024-01-30 MED ORDER — TRAZODONE HCL 50 MG PO TABS
100.0000 mg | ORAL_TABLET | Freq: Every day | ORAL | Status: DC
  Administered 2024-01-30 – 2024-02-02 (×4): 100 mg via ORAL
  Filled 2024-01-30 (×4): qty 2

## 2024-01-30 MED ORDER — FINASTERIDE 5 MG PO TABS
5.0000 mg | ORAL_TABLET | Freq: Every day | ORAL | Status: DC
  Administered 2024-01-30 – 2024-02-02 (×4): 5 mg via ORAL
  Filled 2024-01-30 (×4): qty 1

## 2024-01-30 MED ORDER — ATORVASTATIN CALCIUM 10 MG PO TABS
20.0000 mg | ORAL_TABLET | Freq: Every evening | ORAL | Status: DC
  Administered 2024-01-30 – 2024-02-01 (×3): 20 mg via ORAL
  Filled 2024-01-30 (×3): qty 2

## 2024-01-30 MED ORDER — ASPIRIN 81 MG PO TBEC
81.0000 mg | DELAYED_RELEASE_TABLET | Freq: Every day | ORAL | Status: DC
  Administered 2024-01-31 – 2024-02-02 (×3): 81 mg via ORAL
  Filled 2024-01-30 (×4): qty 1

## 2024-01-30 MED ORDER — LACTATED RINGERS IV SOLN
INTRAVENOUS | Status: AC
Start: 2024-01-30 — End: 2024-01-31

## 2024-01-30 MED ORDER — LACTATED RINGERS IV BOLUS (SEPSIS): 1000.0000 mL | Freq: Once | INTRAVENOUS | Status: DC

## 2024-01-30 MED ORDER — SODIUM CHLORIDE 0.9 % IV SOLN
3.0000 g | Freq: Three times a day (TID) | INTRAVENOUS | Status: DC
  Administered 2024-01-30 – 2024-01-31 (×2): 3 g via INTRAVENOUS
  Filled 2024-01-30 (×2): qty 8

## 2024-01-30 MED ORDER — MIRTAZAPINE 15 MG PO TABS
7.5000 mg | ORAL_TABLET | Freq: Every day | ORAL | Status: DC
  Administered 2024-01-30 – 2024-02-02 (×4): 7.5 mg via ORAL
  Filled 2024-01-30 (×4): qty 1

## 2024-01-30 NOTE — ED Triage Notes (Signed)
 Pt states he fell a few times last night when he got up to use restroom, denies hitting his head. Pt states he has had a fever that started this morning. Pt denies any injuries or pain.

## 2024-01-30 NOTE — ED Notes (Signed)
 Both set of cultures sent

## 2024-01-30 NOTE — ED Notes (Signed)
 Provider speaking to family and patient

## 2024-01-30 NOTE — ED Notes (Signed)
 1L LR started per Dr. Tamela Fake.

## 2024-01-30 NOTE — Progress Notes (Signed)
 Elink following for sepsis protocol.

## 2024-01-30 NOTE — H&P (Addendum)
 History and Physical    Patient: Frank Meyer WGN:[id number] DOB: 04-16-1941 DOA: 01/30/2024 DOS: the patient was seen and examined on 01/30/2024 PCP: Suan Elm, MD  Patient coming from: Home-lives with wife  Chief Complaint:  Chief Complaint  Patient presents with   Fall   HPI: Frank Meyer is a 83 y.o. 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  At presentation during that hospitalization he was hypotensive mildly hypothermic and intermittently tachypneic.  He had mild acute kidney injury and elevated troponin and leukocytosis.  He required short-term vasopressors for hemodynamic support.  Prior to discharge Lasix  and Entresto  were placed on hold noting that his systolic blood pressure readings were in the 90s on date of discharge.  He was discharged on Augmentin  and therapy was to be completed today 6/15.  He has returned to the ED today stating he had fallen a few times during the night while getting up to go to the restroom.  He reported having a fever this morning but denied injuries or pain.  At presentation he was afebrile but systolic blood pressure was 79/59.  He was borderline tachycardic with a pulse of 98.  No increased O2 needs noting O2 at baseline 6 L.  Subsequently has received 1.5 L of lactated Ringer 's with significant improvement in blood pressure readings to the mid 90-100 range.  Respiratory status remains stable.  Lactated Ringer 's currently infusing at 150 cc/h.  Labs revealed creatinine of 1.37 with baseline creatinine 0.85.  Mild leukocytosis 11.3, lactic acid normal x 2 collections 1.1 and 0.9.  Chest x-ray question possible left basilar infiltrate.  PCR screening for flu, RSV and COVID-negative.  Blood cultures  have been obtained.  Due to concerns of a possible sepsis physiology he is been given a dose of vancomycin  and Maxipime .  Hospitalist service has been consulted to evaluate the patient for admission.  I interviewed the patient with his wife at the bedside.  Both confirm he has not been eating well for multiple weeks.  This has been exacerbated over the past month since his beloved pet dog died.  He reports he feels like he may be able to eat but when he sits down to eat he just has no appetite.  1 month ago prior to the death of his dog he was able to go to the gym 3 times per week and actually felt better after going to the gym.  He has not had any change in his activity tolerance although he does report he just stays tired all the time and has no energy.  He does not have any change in baseline shortness of breath.  No swelling in his legs.  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  He freely admits that his condition is not curable and he will die and is very sad because of this.  We talked at length about depression, consideration of possible initiation of SSRI medication.  Need for him to freely express his depression either with a therapist or other setting.  He acknowledged that it is very confusing to him that as a Society we can take an animal and euthanize them and let them die peacefully but questions  why this cannot be done for him.  This juncture he became very teary-eyed.  I did explain to him that from my perspective from a physical exam standpoint he did not appear to be ready for comfort care but this is certainly an option once his pulmonary status declines more severely.  He verbalized understanding.   Review of Systems: As mentioned in the history of present illness. All other systems reviewed and are negative. Past Medical History:  Diagnosis Date   AKI (acute kidney injury) (HCC)  08/03/2022   Allergic rhinitis 10/24/2009   Altered bowel habits 10/04/2020   Basal cell carcinoma    Benign prostatic hyperplasia with lower urinary tract symptoms 10/24/2009   Benign prostatic hypertrophy    Bradycardia 12/06/2019   Callosity 10/16/2013   Cardiomegaly 01/23/2015   Chronic diastolic heart failure (HCC) 12/19/2018   Chronic respiratory failure (HCC) 10/04/2020   Coronary artery calcification seen on CAT scan 05/28/2020   Dyslipidemia    Dyspnea on exertion 03/18/2018   Elevated troponin 08/03/2022   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   Encounter for general adult medical examination without abnormal findings 01/17/2015   Epistaxis 09/18/2016   Ex-cigarette smoker 01/20/2021   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   Gallbladder problem    Gastroesophageal reflux disease    Hiatal hernia    Hilar adenopathy 03/25/2018   History of seizures 08/03/2022   History of smoking 03/18/2018   History of transient ischemic attack (TIA) 10/24/2009   Hyperlipidemia 10/24/2009   Insomnia 05/09/2013   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   Internal hemorrhoid 10/04/2020   Macular degeneration    Macular pucker, right eye 04/30/2020   Mediastinal adenopathy 03/25/2018   Memory loss 05/09/2013   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   Mild memory disturbance    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   Noninfective gastroenteritis and colitis, unspecified 10/04/2020   Nuclear sclerotic cataract of right eye 12/18/2019   Other specified symptoms and signs involving the digestive system and abdomen 10/04/2020   Overweight 09/14/2018   Oxygen dependent 10/04/2020   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 His smoking use included cigarettes. He started smoking about 56 years ago. He has a 50 pack-year smoking history. He has never used smokeless tobacco. He reports current alcohol use of about 15.0 - 20.0 standard drinks of alcohol per week. He reports that he does not use drugs.  No Known Allergies  Family History  Problem Relation Age of Onset   Obesity Mother    Memory loss Mother     Prior to Admission medications   Medication Sig Start Date End Date Taking? Authorizing Provider  albuterol  (VENTOLIN  HFA) 108 (90 Base) MCG/ACT inhaler Inhale 1-2 puffs into the lungs every 4 (four) hours as needed for wheezing or shortness of breath.    [provider]  amoxicillin -clavulanate (AUGMENTIN ) 875-125 MG tablet Take 1 tablet by mouth 2 (two) times daily for 3 days. 01/27/24 01/30/24  Joette Mustard, MD  ANORO ELLIPTA  62.5-25 MCG/ACT AEPB USE 1 INHALATION BY MOUTH ONCE  DAILY AT THE SAME TIME [facility] DAY Patient taking differently: Inhale 1 puff into the lungs daily. 01/21/24   Hunsucker, Archer Kobs, MD  aspirin  81 MG EC tablet Take 81 mg by mouth daily.    [provider]  atorvastatin  (LIPITOR) 20 MG tablet Take 20 mg by mouth every evening.    [provider]  cetirizine (ZYRTEC) 10 MG tablet Take 10 mg by mouth at bedtime.    [provider]  cholecalciferol  (VITAMIN D ) 1000 UNITS tablet Take 1,000 Units by mouth daily.    [provider]  donepezil  (ARICEPT ) 10 MG tablet TAKE 1 TABLET BY MOUTH DAILY Patient taking differently:  Take 10 mg by mouth at bedtime. 08/30/23   Camara, Amadou, MD  ENTRESTO  24-26 MG TAKE 1 TABLET BY MOUTH DAILY 06/18/23   Krasowski, Robert J, MD  ferrous sulfate 325 (65 FE) MG EC tablet Take 325 mg by mouth daily with breakfast.    [provider]  finasteride  (PROSCAR ) 5 MG tablet Take 5 mg by mouth at bedtime.    [provider]  furosemide  (LASIX ) 40 MG tablet Take 1 tablet (40 mg total) by mouth every other day. Patient not taking: Reported on 01/24/2024 10/31/22   Paige, Victoria J, DO  ipratropium (ATROVENT ) 0.06 % nasal spray Place 1-2 sprays into both nostrils 4 (four) times daily as needed for rhinitis (runny nose). 2 sprays in the right nostril and 1 spray in the left nostril once daily 02/09/13   [provider]  ipratropium-albuterol  (DUONEB) 0.5-2.5 (3) MG/3ML SOLN Take 3 mLs by nebulization every 6 (six) hours as needed. Patient taking differently: Take 3 mLs by nebulization in the morning and at bedtime. 01/11/24   Wilfredo Hanly, MD  levETIRAcetam  (KEPPRA ) 250 MG tablet TAKE 1 TABLET BY MOUTH IN THE  MORNING AND 2 TABLETS AT NIGHT Patient taking differently: Take 250-500 mg by mouth 2 (two) times daily. Take one tablet by mouth in the morning and take two tablets by mouth in the evening daily. 10/06/23   Camara, Amadou, MD  Melatonin 10 MG TABS Take 10 mg by mouth at bedtime.    [provider]  nitroGLYCERIN  (NITROSTAT ) 0.4 MG SL tablet Place 1 tablet (0.4 mg total) under the tongue every 5 (five) minutes as needed. Chest pain Patient taking differently: Place 0.4 mg under the tongue every 5 (five) minutes as needed for chest pain. Chest pain 07/15/21 01/24/24  Krasowski, Robert J, MD  OXYGEN Inhale 2-5 L/min into the lungs continuous. 5 lpm during the day and 2.5 lpm at night    [provider]  pantoprazole  (PROTONIX ) 40 MG tablet Take 40 mg by mouth every morning.    [provider]  polyethylene glycol (MIRALAX  / GLYCOLAX ) 17 g  packet Take 17 g by mouth daily. 01/27/24   Joette Mustard, MD  thiamine  100 MG tablet Take 1 tablet (100 mg total) by mouth daily. 12/13/18   Vann, Jessica U, DO  traZODone  (DESYREL ) 100 MG tablet Take 1 tablet (100 mg total) by mouth at bedtime. 01/26/24   Joette Mustard, MD    Physical Exam: Vitals:   01/30/24 1430 01/30/24 1515 01/30/24 1530 01/30/24 1545  BP: 91/61 95/60 97/63  96/63  Pulse: 72 74 73 74  Resp: 16 19 20 20   Temp:      SpO2: 100% 100% 100% 98%  Weight:      Height:       Constitutional: Pale in appearance.  No acute physical distress at this juncture Respiratory: clear to auscultation bilaterally, no wheezing, no crackles. Normal respiratory effort. No accessory muscle use.  6 L oxygen Cardiovascular: Regular rate and rhythm, no murmurs / rubs / gallops. No extremity edema. 2+ pedal pulses.  Abdomen: no tenderness, no masses palpated. No hepatosplenomegaly. Bowel sounds positive.  Musculoskeletal: no clubbing / cyanosis. No joint deformity upper and lower extremities. Good ROM, no contractures. Normal muscle tone.  Skin: no rashes, lesions, ulcers. No induration Neurologic: CN 2-12 grossly intact. Sensation intact,  Strength 4/5 x all 4 extremities.  Psychiatric: Normal judgment and insight.  Flat affect.  Oriented x 3.   Data Reviewed:  Sodium 135, potassium 4.2, CO2 24, glucose 113, BUN 19, creatinine 1.37, albumin  2.4, total protein 6.1  Lactic acid 1.1 and 0.9  WBC 11.3 with normal differential, hemoglobin 10, platelets 397,000  TSH pending, free T4 was 1.21  Blood cultures pending  CRP and random cortisol pending  Chest x-ray described as above  Assessment and Plan: Severe dehydration with associated hypotension Mild AKI on CKD 3a Baseline creatinine 0.85 with current creatinine 1.37-follow labs Etiology of AKI and dehydration secondary to prolonged poor eating in context of severe depression-see below SBP has improved from 70 to 90 after 1500  cc of LR Continue LR 150 cc/h until a.m. and reevaluate Check TSH to rule out severe hypothyroidism especially with underlying depression Check random cortisol On Aricept  so check orthostatic vital signs  ES COPD/Chronic hypoxia and 6L O2 Pulmonary hypertension Abnormal chest x-ray Patient has no acute upper respiratory symptoms such as cough, fevers chills, dyspnea on exertion or chest discomfort. Chest x-ray performed in the ED question of possible left basilar infiltrate. No wheezing on exam either so do not suspect COPD exacerbation. No indication to initiate antibiotics.  Check CRP and procalcitonin.  Given degree of hypotension at presentation suspect CRP may be elevated solely from that.  Lactic acid was normal. Will resume home pulmonary medications and continue current nasal cannula O2 at 6 L which is his baseline. (Anoro Ellipta  1 puff daily, albuterol  MDI 1 to 2 puffs every 4 hours as needed, and DuoNeb twice daily0  Depression/EOL Patient has been experiencing depression for multiple months exacerbated by the recent loss of his dog about 1 month ago.  This time he developed poor appetite/anorexia.  He would state that he is somewhat hungry but when he would sit down to eat he could not eat. He expresses that he is tired of having to go to the doctor all the time due to his underlying lung disease and stated that he did not understand why if a dog could be given medicine to make them pass away comfortably while this could not be done for him. I have reviewed his outpatient records with pulmonologist Dr. Diania Fortes.  Please see the following notation: He has concerns about the future, particularly regarding the potential loss of independence and the need for increased oxygen support. He is concerned about the impact of his health on his wife, especially if he becomes bedbound and requires more intensive care. He is contemplating end-of-life decisions and prefers not to pass away at home to  avoid burdening his wife. He has considered hospice care for when he can no longer maintain his  independence.  Wife and patient did not think he was currently taking an antidepressant but med rec states he was started on trazodone  100 mg daily at bedtime on date of discharge.  I will consult our palliative medicine team to assess patient palliative performance score and to provide resources in the outpatient setting regarding end-of-life issues.  HFpEF Systolic blood pressure readings in the 90s at time of discharge on 6/11 therefore Lasix  and Entresto  were pulsed  Seizure disorder Continue Keppra   BPH Continue Proscar   Early memory loss/Alzheimer's dementia Certainly contributing to patient's depression and loss of hope See above regarding palliative medicine consultation Hold Aricept  since this can cause orthostatic hypotension  Protein calorie malnutrition Poor oral intake and appetite secondary to severe depression BMI 25.9 Albumin  2.4 and total protein 6.1 Nutrition consultation  HLD Continue statin    Advance Care Planning:   Code Status: Limited: Do not attempt resuscitation (DNR) -DNR-LIMITED -Do Not Intubate/DNI    VTE prophylaxis: Lovenox   Consults: Palliative medicine  Family Communication: Wife at bedside  Severity of Illness: The appropriate patient status for this patient is INPATIENT. Inpatient status is judged to be reasonable and necessary in order to provide the required intensity of service to ensure the patient's safety. The patient's presenting symptoms, physical exam findings, and initial radiographic and laboratory data in the context of their chronic comorbidities is felt to place them at high risk for further clinical deterioration. Furthermore, it is not anticipated that the patient will be medically stable for discharge from the hospital within 2 midnights of admission.   * I certify that at the point of admission it is my clinical judgment that the  patient will require inpatient hospital care spanning beyond 2 midnights from the point of admission due to high intensity of service, high risk for further deterioration and high frequency of surveillance required.*  Author: Kathye Parkin, NP 01/30/2024 4:30 PM  For on call review [email or web address].

## 2024-01-30 NOTE — Progress Notes (Signed)
 Pharmacy Antibiotic Note  Duwayne KASEAN DENHERDER is a 83 y.o. male admitted on 01/30/2024 with pneumonia.  Pharmacy has been consulted for Unasyn dosing.  Plan: Unasyn 3g IV q 8 hrs. F/u cultures, renal function and clinical course.  Height: 5' 6 (167.6 cm) Weight: 70.8 kg (156 lb 1.4 oz) IBW/kg (Calculated) : 63.8  Temp (24hrs), Avg:98.2 F (36.8 C), Min:98 F (36.7 C), Max:98.4 F (36.9 C)  Recent Labs  Lab 01/23/24 1839 01/24/24 0303 01/26/24 0832 01/30/24 1305 01/30/24 1314 01/30/24 1548  WBC 8.9 9.5  --  11.3*  --   --   CREATININE 1.50* 1.05 0.85 1.37*  --   --   LATICACIDVEN  --   --   --   --  1.1 0.9    Estimated Creatinine Clearance: 36.9 mL/min (A) (by C-G formula based on SCr of 1.37 mg/dL (H)).    No Known Allergies   Thank you for allowing pharmacy to be a part of this patient's care.  Joanell Mowers, Davey Erp, BCCP Clinical Pharmacist  01/30/2024 5:38 PM   [facility] pharmacy phone numbers are listed on [email or web address]

## 2024-01-30 NOTE — ED Notes (Signed)
 [facility] aware of cardiac monitoring

## 2024-01-30 NOTE — ED Provider Notes (Signed)
 [place] EMERGENCY DEPARTMENT AT [facility] Provider Note   CSN: [id number] Arrival date & time: 01/30/24  1253     Patient presents with: Fall   Frank Meyer is a 83 y.o. male.  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 Appetite was low He says he thinks he was walking to the O2 on his nightime O2 2-3L instead of his 6L      Past Medical History:  Diagnosis Date   AKI (acute kidney injury) (HCC) 08/03/2022   Allergic rhinitis 10/24/2009   Altered bowel habits 10/04/2020   Basal cell carcinoma    Benign prostatic hyperplasia with lower urinary tract symptoms 10/24/2009   Benign prostatic hypertrophy    Bradycardia 12/06/2019   Callosity 10/16/2013   Cardiomegaly 01/23/2015   Chronic diastolic heart failure (HCC) 12/19/2018   Chronic respiratory failure (HCC) 10/04/2020   Coronary artery calcification seen on CAT scan 05/28/2020   Dyslipidemia    Dyspnea on exertion 03/18/2018   Elevated troponin 08/03/2022   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   Encounter for general adult medical examination without abnormal findings 01/17/2015   Epistaxis 09/18/2016   Ex-cigarette smoker 01/20/2021   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   Gallbladder problem    Gastroesophageal reflux  disease    Hiatal hernia    Hilar adenopathy 03/25/2018   History of seizures 08/03/2022   History of smoking 03/18/2018   History of transient ischemic attack (TIA) 10/24/2009   Hyperlipidemia 10/24/2009   Insomnia 05/09/2013   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   Internal hemorrhoid 10/04/2020   Macular degeneration    Macular pucker, right eye 04/30/2020   Mediastinal adenopathy 03/25/2018   Memory loss 05/09/2013   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   Mild memory disturbance    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   Noninfective gastroenteritis and colitis, unspecified 10/04/2020   Nuclear sclerotic cataract of right eye 12/18/2019   Other specified symptoms and signs involving the digestive system and abdomen 10/04/2020   Overweight 09/14/2018   Oxygen dependent 10/04/2020   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 Authorizing Provider  albuterol  (VENTOLIN  HFA) 108 (90 Base) MCG/ACT inhaler Inhale 1-2 puffs into the lungs every 4 (four) hours as needed for wheezing or shortness of breath.    [provider]  amoxicillin -clavulanate (AUGMENTIN ) 875-125 MG tablet Take 1 tablet by mouth 2 (two) times daily for 3 days. 01/27/24 01/30/24  Joette Mustard, MD  ANORO ELLIPTA  62.5-25 MCG/ACT AEPB USE 1 INHALATION BY MOUTH ONCE  DAILY AT THE SAME TIME [facility] DAY Patient taking differently: Inhale 1 puff into the lungs daily.  01/21/24   Hunsucker, Archer Kobs, MD  aspirin  81 MG EC tablet Take 81 mg by mouth daily.    [provider]  atorvastatin  (LIPITOR) 20 MG tablet Take 20 mg by mouth every evening.    [provider]  cetirizine (ZYRTEC) 10 MG tablet Take 10 mg by mouth at bedtime.    [provider]  cholecalciferol  (VITAMIN D ) 1000 UNITS tablet Take 1,000 Units by mouth daily.    [provider]  donepezil  (ARICEPT ) 10 MG tablet TAKE 1 TABLET BY MOUTH DAILY Patient taking differently: Take 10 mg by mouth at bedtime. 08/30/23   Camara, Amadou, MD  ENTRESTO  24-26 MG TAKE 1 TABLET BY MOUTH DAILY 06/18/23   Krasowski, Robert J, MD  ferrous sulfate 325 (65 FE) MG EC tablet Take 325 mg by mouth daily with breakfast.    [provider]  finasteride  (PROSCAR ) 5 MG tablet Take 5 mg by mouth at bedtime.    [provider]  furosemide  (LASIX ) 40 MG tablet Take 1 tablet (40 mg total) by mouth every other day. Patient not taking: Reported on 01/24/2024 10/31/22   Paige, Victoria J, DO  ipratropium (ATROVENT ) 0.06 % nasal spray Place 1-2 sprays into both nostrils 4 (four) times daily as needed for rhinitis (runny nose). 2 sprays in the right nostril and 1 spray in the left nostril once daily 02/09/13   [provider]  ipratropium-albuterol  (DUONEB) 0.5-2.5 (3) MG/3ML SOLN Take 3 mLs by nebulization every 6 (six) hours as needed. Patient taking differently: Take 3 mLs by nebulization in the morning and at bedtime. 01/11/24   Wilfredo Hanly, MD  levETIRAcetam  (KEPPRA ) 250 MG tablet TAKE 1 TABLET BY MOUTH IN THE  MORNING AND 2 TABLETS AT NIGHT Patient taking differently: Take 250-500 mg by mouth 2 (two) times daily. Take one tablet by mouth in the morning and take two tablets by mouth in the evening daily. 10/06/23   Camara, Amadou, MD  Melatonin 10 MG TABS Take 10 mg by mouth at bedtime.    [provider]  nitroGLYCERIN  (NITROSTAT ) 0.4 MG SL tablet Place 1 tablet  (0.4 mg total) under the tongue every 5 (five) minutes as needed. Chest pain Patient taking differently: Place 0.4 mg under the tongue every 5 (five) minutes as needed for chest pain. Chest pain 07/15/21 01/24/24  Krasowski, Robert J, MD  OXYGEN Inhale 2-5 L/min into the lungs continuous. 5 lpm during the day and 2.5 lpm at night    [provider]  pantoprazole  (PROTONIX ) 40 MG tablet Take 40 mg by mouth every morning.    [provider]  polyethylene glycol (MIRALAX  / GLYCOLAX ) 17 g packet Take 17 g by mouth daily. 01/27/24   Joette Mustard, MD  thiamine  100 MG tablet Take 1 tablet (100 mg total) by mouth daily. 12/13/18   Vann, Jessica U, DO  traZODone  (DESYREL ) 100 MG tablet Take 1 tablet (100 mg total) by mouth at bedtime. 01/26/24   Joette Mustard, MD    Allergies: Patient has no known allergies.    Review of Systems  Updated Vital Signs BP (!) 79/59   Pulse 98   Temp 98  F (36.7 C)   Resp (!) 24   Ht 5' 6 (1.676 m)   Wt 73 kg   SpO2 99%   BMI 25.98 kg/m   Physical Exam Vitals and nursing note reviewed.  Constitutional:      General: He is not in acute distress.    Appearance: He is well-developed. He is not diaphoretic.  HENT:     Head: Normocephalic and atraumatic.   Eyes:     Conjunctiva/sclera: Conjunctivae normal.    Cardiovascular:     Rate and Rhythm: Normal rate and regular rhythm.     Heart sounds: Normal heart sounds. No murmur heard.    No friction rub. No gallop.  Pulmonary:     Effort: Pulmonary effort is normal. No respiratory distress.     Breath sounds: Normal breath sounds. No wheezing or rales.  Abdominal:     General: There is no distension.     Palpations: Abdomen is soft.     Tenderness: There is no abdominal tenderness. There is no guarding.   Musculoskeletal:     Cervical back: Normal range of motion.   Skin:    General: Skin is warm and dry.   Neurological:     Mental Status: He is alert and oriented to person,  place, and time.     (all labs ordered are listed, but only abnormal results are displayed) Labs Reviewed  CULTURE, BLOOD (ROUTINE X 2)  CULTURE, BLOOD (ROUTINE X 2)  COMPREHENSIVE METABOLIC PANEL WITH GFR  CBC WITH DIFFERENTIAL/PLATELET  PROTIME-INR  URINALYSIS, W/ REFLEX TO CULTURE (INFECTION SUSPECTED)  I-STAT CG4 LACTIC ACID, ED    EKG: None  Radiology: No results found.  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  Blood pressures in triage in the 60s and 70s.  Blood cultures drawn, ordered empiric antibiotics to treat HCAP with vancomycin  and cefepime  given his recent admission for pneumonia.  EKG was completed and personally eval and interpreted by me and shows sinus rhythm with abnormal T waves anteriorly which is new from prior.  Labs completed and personally evaluated and interpreted by me show normal lactic acid, mild AKI with a creatinine of 1.37 from 0.85, no clinically significant electrolyte abnormalities.  CBC shows a white blood cell count of 11.3, hemoglobin of 10 from 9.6.  INR is within normal limits.  Given his hypertension also ordered TSH, free T4 and cortisol.  Chest x-ray was completed and personally eval and interpreted by me and radiology shows developing left basilar airspace disease concerning for pneumonia.  Given he does not not want  intubation, and has severe chronic respiratory failure on 6 L of oxygen, and history of chronic heart failure with reduced ejection fraction, did not order full 30 cc/kg of lactated Ringer 's.  His blood pressures are improving  with 1 L of LR at this time and he is maintaining his oxygen saturation.     {Document critical care time when appropriate  Document review of labs and clinical decision tools ie CHADS2VASC2, etc  Document your independent review of radiology images and any outside records  Document your discussion with family members, caretakers and with consultants  Document social determinants of health affecting pt's care  Document your decision making why or why not admission, treatments were needed:32947:::1}   Final diagnoses:  None    ED Discharge Orders     None

## 2024-01-31 ENCOUNTER — Inpatient Hospital Stay (HOSPITAL_COMMUNITY)

## 2024-01-31 DIAGNOSIS — Z515 Encounter for palliative care: Secondary | ICD-10-CM | POA: Diagnosis not present

## 2024-01-31 DIAGNOSIS — Z789 Other specified health status: Secondary | ICD-10-CM

## 2024-01-31 DIAGNOSIS — I951 Orthostatic hypotension: Secondary | ICD-10-CM

## 2024-01-31 DIAGNOSIS — Z7189 Other specified counseling: Secondary | ICD-10-CM | POA: Diagnosis not present

## 2024-01-31 DIAGNOSIS — J449 Chronic obstructive pulmonary disease, unspecified: Secondary | ICD-10-CM

## 2024-01-31 DIAGNOSIS — R0600 Dyspnea, unspecified: Secondary | ICD-10-CM

## 2024-01-31 DIAGNOSIS — R0602 Shortness of breath: Secondary | ICD-10-CM

## 2024-01-31 DIAGNOSIS — Z66 Do not resuscitate: Secondary | ICD-10-CM

## 2024-01-31 LAB — COMPREHENSIVE METABOLIC PANEL WITH GFR
ALT: 32 U/L (ref 0–44)
AST: 32 U/L (ref 15–41)
Albumin: 2 g/dL — ABNORMAL LOW (ref 3.5–5.0)
Alkaline Phosphatase: 51 U/L (ref 38–126)
Anion gap: 8 (ref 5–15)
BUN: 14 mg/dL (ref 8–23)
CO2: 25 mmol/L (ref 22–32)
Calcium: 7.8 mg/dL — ABNORMAL LOW (ref 8.9–10.3)
Chloride: 104 mmol/L (ref 98–111)
Creatinine, Ser: 1.03 mg/dL (ref 0.61–1.24)
GFR, Estimated: >60 mL/min (ref >60)
Glucose, Bld: 99 mg/dL (ref 70–99)
Potassium: 3.9 mmol/L (ref 3.5–5.1)
Sodium: 137 mmol/L (ref 135–145)
Total Bilirubin: 0.9 mg/dL (ref 0.0–1.2)
Total Protein: 5 g/dL — ABNORMAL LOW (ref 6.5–8.1)

## 2024-01-31 LAB — CBC WITH DIFFERENTIAL/PLATELET
Abs Immature Granulocytes: 0.04 10*3/uL (ref 0.00–0.07)
Basophils Absolute: 0.1 10*3/uL (ref 0.0–0.1)
Basophils Relative: 1 %
Eosinophils Absolute: 0.4 10*3/uL (ref 0.0–0.5)
Eosinophils Relative: 5 %
HCT: 28.3 % — ABNORMAL LOW (ref 39.0–52.0)
Hemoglobin: 9 g/dL — ABNORMAL LOW (ref 13.0–17.0)
Immature Granulocytes: 1 %
Lymphocytes Relative: 16 %
Lymphs Abs: 1.4 10*3/uL (ref 0.7–4.0)
MCH: 26.1 pg (ref 26.0–34.0)
MCHC: 31.8 g/dL (ref 30.0–36.0)
MCV: 82 fL (ref 80.0–100.0)
Monocytes Absolute: 0.8 10*3/uL (ref 0.1–1.0)
Monocytes Relative: 9 %
Neutro Abs: 6.1 10*3/uL (ref 1.7–7.7)
Neutrophils Relative %: 68 %
Platelets: 299 10*3/uL (ref 150–400)
RBC: 3.45 MIL/uL — ABNORMAL LOW (ref 4.22–5.81)
RDW: 14.1 % (ref 11.5–15.5)
WBC: 8.8 10*3/uL (ref 4.0–10.5)
nRBC: 0 % (ref 0.0–0.2)

## 2024-01-31 LAB — MAGNESIUM: Magnesium: 2.1 mg/dL (ref 1.7–2.4)

## 2024-01-31 LAB — CORTISOL: Cortisol, Plasma: 8.7 ug/dL

## 2024-01-31 MED ORDER — ORAL CARE MOUTH RINSE: 15.0000 mL | OROMUCOSAL | Status: DC | PRN

## 2024-01-31 MED ORDER — IPRATROPIUM-ALBUTEROL 0.5-2.5 (3) MG/3ML IN SOLN
3.0000 mL | Freq: Three times a day (TID) | RESPIRATORY_TRACT | Status: DC
  Administered 2024-02-01 – 2024-02-03 (×7): 3 mL via RESPIRATORY_TRACT
  Filled 2024-01-31 (×7): qty 3

## 2024-01-31 MED ORDER — IPRATROPIUM-ALBUTEROL 0.5-2.5 (3) MG/3ML IN SOLN
3.0000 mL | Freq: Four times a day (QID) | RESPIRATORY_TRACT | Status: DC
  Administered 2024-01-31 (×3): 3 mL via RESPIRATORY_TRACT
  Filled 2024-01-31 (×3): qty 3

## 2024-01-31 MED ORDER — METHYLPREDNISOLONE SODIUM SUCC 40 MG IJ SOLR
40.0000 mg | Freq: Three times a day (TID) | INTRAMUSCULAR | Status: DC
  Administered 2024-01-31 – 2024-02-03 (×9): 40 mg via INTRAVENOUS
  Filled 2024-01-31 (×9): qty 1

## 2024-01-31 MED ORDER — METOPROLOL TARTRATE 5 MG/5ML IV SOLN: 5.0000 mg | INTRAVENOUS | Status: DC | PRN

## 2024-01-31 MED ORDER — FUROSEMIDE 10 MG/ML IJ SOLN
40.0000 mg | Freq: Two times a day (BID) | INTRAMUSCULAR | Status: DC
  Administered 2024-01-31 – 2024-02-03 (×6): 40 mg via INTRAVENOUS
  Filled 2024-01-31 (×6): qty 4

## 2024-01-31 MED ORDER — ENSURE PLUS HIGH PROTEIN PO LIQD
237.0000 mL | Freq: Two times a day (BID) | ORAL | Status: DC
  Administered 2024-01-31 – 2024-02-03 (×6): 237 mL via ORAL

## 2024-01-31 MED ORDER — THIAMINE MONONITRATE 100 MG PO TABS
100.0000 mg | ORAL_TABLET | Freq: Every day | ORAL | Status: DC
  Administered 2024-01-31 – 2024-02-02 (×3): 100 mg via ORAL
  Filled 2024-01-31 (×3): qty 1

## 2024-01-31 MED ORDER — IPRATROPIUM-ALBUTEROL 0.5-2.5 (3) MG/3ML IN SOLN: 3.0000 mL | RESPIRATORY_TRACT | Status: DC | PRN

## 2024-01-31 MED ORDER — GUAIFENESIN 100 MG/5ML PO LIQD: 5.0000 mL | ORAL | Status: DC | PRN

## 2024-01-31 MED ORDER — HYDRALAZINE HCL 20 MG/ML IJ SOLN: 10.0000 mg | INTRAMUSCULAR | Status: DC | PRN

## 2024-01-31 MED ORDER — MORPHINE SULFATE (PF) 2 MG/ML IV SOLN: 2.0000 mg | INTRAVENOUS | Status: DC | PRN

## 2024-01-31 MED ORDER — ADULT MULTIVITAMIN W/MINERALS CH
1.0000 | ORAL_TABLET | Freq: Every day | ORAL | Status: DC
  Administered 2024-01-31 – 2024-02-02 (×3): 1 via ORAL
  Filled 2024-01-31 (×3): qty 1

## 2024-01-31 MED ORDER — LORAZEPAM 2 MG/ML IJ SOLN: 1.0000 mg | INTRAMUSCULAR | Status: DC | PRN

## 2024-01-31 NOTE — Evaluation (Signed)
 Clinical/Bedside Swallow Evaluation Patient Details  Name: Frank Meyer MRN: [id number] Date of Birth: July 06, 1941  Today's Date: 01/31/2024 Time: SLP Start Time (ACUTE ONLY): 0852 SLP Stop Time (ACUTE ONLY): 0907 SLP Time Calculation (min) (ACUTE ONLY): 15 min  Past Medical History:  Past Medical History:  Diagnosis Date   AKI (acute kidney injury) (HCC) 08/03/2022   Allergic rhinitis 10/24/2009   Altered bowel habits 10/04/2020   Basal cell carcinoma    Benign prostatic hyperplasia with lower urinary tract symptoms 10/24/2009   Benign prostatic hypertrophy    Bradycardia 12/06/2019   Callosity 10/16/2013   Cardiomegaly 01/23/2015   Chronic diastolic heart failure (HCC) 12/19/2018   Chronic respiratory failure (HCC) 10/04/2020   Coronary artery calcification seen on CAT scan 05/28/2020   Dyslipidemia    Dyspnea on exertion 03/18/2018   Elevated troponin 08/03/2022   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   Encounter for general adult medical examination without abnormal findings 01/17/2015   Epistaxis 09/18/2016   Ex-cigarette smoker 01/20/2021   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   Gallbladder problem    Gastroesophageal reflux disease    Hiatal hernia    Hilar adenopathy 03/25/2018   History of seizures 08/03/2022   History of smoking 03/18/2018   History of transient ischemic attack (TIA) 10/24/2009   Hyperlipidemia 10/24/2009   Insomnia 05/09/2013   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   Internal hemorrhoid 10/04/2020   Macular degeneration    Macular pucker, right eye 04/30/2020   Mediastinal adenopathy 03/25/2018   Memory loss 05/09/2013   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   Mild memory disturbance    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   Noninfective gastroenteritis and colitis, unspecified 10/04/2020   Nuclear sclerotic  cataract of right eye 12/18/2019   Other specified symptoms and signs involving the digestive system and abdomen 10/04/2020   Overweight 09/14/2018   Oxygen dependent 10/04/2020   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   Personal history of colonic polyps 10/04/2020   Posterior vitreous detachment of right eye 12/18/2019   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 He fell after tripping on his oxygen tubing overnight last night, and family checked his BP, which was SBP ~60. His baseline is around 100, and in the ED he was still hypotensive with SBP ~60. He had hit his head on his nightstand getting out of bed before his fall, but he denies LOC during this episode. He denies SOB, CP, wheezing, n/v/d, abdominal pain, hematuria, rashes/ wounds, painful or red joints. He has been compliant with his PTA nebs for his COPD.    Assessment / Plan / Recommendation  Clinical Impression   Pt presents with concerns for a pharyngeal dysphagia per clinical swallow assessment completed today. Oral phase judged to be WNL. Pharyngeal swallow initiation appeared prompt with laryngeal elevation noted. Observed occasional single cough  or throat clear following sips of thin liquids. Pt attributed cough to phlegm production from COPD vs aspiration event. We discussed that given his tenuous respiratory status, an MBSS should be considered pending his goals of care.  Pt politely requested to hold on planning for a MBSS until he and his wife have further goals of care discussions. SLP in agreement with plan. Pt requested to be advanced to a regular diet as a modified diet has made ordering food difficult. He consumes a regular diet at baseline and does not demonstrate difficulty with mastication.   Diet advanced to a regular consistency, continue thin liquids. Pt requested to take PO meds whole in applesauce.   Plan: SLP will follow up to assess diet tolerance and offer MBSS if this aligns with goals of care, though anticipate MBSS will be deferred. Our team is available for further education on standard aspiration precautions and oral care to reduce aspiration risk.   SLP Visit Diagnosis: Dysphagia, unspecified (R13.10)    Aspiration Risk  Mild aspiration risk    Diet Recommendation Thin liquid;Regular    Liquid Administration via: Cup;Straw Medication Administration: Whole meds with puree (per pt request) Supervision: Patient able to self feed Compensations: Slow rate;Small sips/bites Postural Changes: Seated upright at 90 degrees    Other  Recommendations Oral Care Recommendations: Oral care BID     Assistance Recommended at Discharge Defer to medical team  Functional Status Assessment Patient has had a recent decline in their functional status and demonstrates the ability to make significant improvements in function in a reasonable and predictable amount of time.  Frequency and Duration min 2x/week  1 week       Prognosis   fair     Swallow Study   General Date of Onset: 01/30/24 HPI: Frank Meyer is an 83 y/o gentleman with a history of chronic respiratory failure with hypoxia due to COPD requiring 6L [place], HFpEF who presented to the ED with hypotension. He fell after tripping on his oxygen tubing overnight last night, and family checked his BP, which was SBP ~60. His baseline is around 100, and in the ED he was still hypotensive with SBP  ~60. He had hit his head on his nightstand getting out of bed before his fall, but he denies LOC during this episode. He denies SOB, CP, wheezing, n/v/d, abdominal pain, hematuria, rashes/ wounds, painful or red joints. He has been compliant with his PTA nebs for his COPD. Type of Study: Bedside Swallow Evaluation Previous Swallow Assessment: MBS 06/24/22 with mild-moderate pharygneal dysphagia noted; D3 diet and thin liquids recommended at that time Diet Prior to this Study: Dysphagia 3 (mechanical soft);Thin liquids (Level 0) Temperature Spikes Noted: No Respiratory Status: Nasal cannula (started on 6L, though was transitioned to HFNC 15L by RT) History of Recent Intubation: No Behavior/Cognition: Alert;Cooperative Oral Cavity Assessment: Within Functional Limits Oral Cavity - Dentition: Adequate natural dentition Vision: Functional for self-feeding Patient Positioning: Upright in bed Baseline Vocal Quality: Low vocal intensity Volitional Cough:  (fair) Volitional Swallow: Able to elicit    Oral/Motor/Sensory Function Overall Oral Motor/Sensory Function: Within functional limits   Ice Chips Ice chips: Not tested   Thin Liquid Thin Liquid: Impaired Presentation: Cup;Straw Pharyngeal  Phase Impairments: Cough - Delayed    Nectar Thick Nectar Thick Liquid: Not tested   Honey Thick Honey Thick Liquid: Not tested   Puree Puree: Within functional limits Presentation: Self Fed   Solid     Solid: Within functional limits Presentation: Self  Fed      [employer] 01/31/2024,10:00 AM

## 2024-01-31 NOTE — Hospital Course (Addendum)
 Brief Narrative:   83 y.o. 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  During recent hospitalization treated for mechanical fall, septic shock treated with antibiotics.  Now admitted for another syncopal episode, AKI, dehydration, sepsis from possible aspiration.  During the position patient was diuresed, started on aggressive bronchodilators and steroids.  Slowly his oxygen needs stabilized around 7-9 L nasal cannula.  Given extent of his pulmonary condition, an extensive discussion with hospice/palliative care team patient was being transitioned to comfort care.  Will discharge him today in stable condition.   Assessment & Plan:  Principal Problem:   Hypotension Active Problems:   Pulmonary hypertension (HCC)   Seizures (HCC)   Dyslipidemia   Barrett's esophagus   Chronic respiratory failure with hypoxia and hypercapnia (HCC)   Hyperlipidemia   Chronic diastolic CHF (congestive heart failure) (HCC)   BPH (benign prostatic hyperplasia)   GERD (gastroesophageal reflux disease)   Mild cognitive impairment of uncertain or unknown etiology   Chronic obstructive pulmonary disease (HCC)   Malnutrition of moderate degree   Sepsis (HCC)    Severe dehydration with associated hypotension Mild AKI on CKD 3a Baseline creatinine 0.8, admission creatinine 1.37, now back to baseline with diuretics   Acute on chronic hypoxic respiratory failure Acute COPD exacerbation Pulmonary hypertension Abnormal chest x-ray Recent increase in oxygen needs, now on 5 to 6 L nasal cannula at baseline but today at 9 L nasal cannula.  Procalcitonin negative.  Nonspecific elevation of CRP.  Bronchodilators, I-S/flutter valve.  Discontinue antibiotics.  Leukocytosis secondary  to steroid use -Continue Solu-Medrol  and Lasix , will transition to p.o. prednisone  for 2 more weeks/slow taper and p.o. Lasix  as needed as he appears euvolemic today.  May need to be on lifelong prednisone  but will defer this to outpatient provider depending on his improvement   Acute on chronic HFpEF Euvolemic.  Transition to Lasix  p.o. as needed    Depression/EOL Will be discharged home.   Seizure disorder Continue Keppra    BPH Continue Proscar    Early memory loss/Alzheimer's dementia Aricept  on hold due to low blood pressure   Protein calorie malnutrition Encourage p.o. nutrition   HLD Continue statin  No escalation of care.  Plans to transition him home with hospice. Discontinue daily blood work Minimize medications that are not really for comfort    DVT prophylaxis: Lovenox     Code Status: Limited: Do not attempt resuscitation (DNR) -DNR-LIMITED -Do Not Intubate/DNI  Family Communication: Family updated Status is: Inpatient DC today   Subjective: Patient feels well no complaints.  Daughter at bedside and spoke with his wife over the phone.  No complaints.  Wishing to go home. Examination:  General exam: Appears calm and comfortable, 7 L nasal cannula, cachectic frail Respiratory system: Diminished breath sounds bilaterally Cardiovascular system: S1 & S2 heard, RRR. No JVD, murmurs, rubs, gallops or clicks. No pedal edema. Gastrointestinal system: Abdomen is nondistended, soft and nontender. No organomegaly or masses felt. Normal bowel sounds heard. Central nervous system: Alert and oriented. No focal neurological deficits. Extremities: Symmetric 5 x 5 power. Skin: No rashes, lesions or ulcers Psychiatry: Judgement and insight appear normal. Mood & affect appropriate.

## 2024-01-31 NOTE — Plan of Care (Signed)
  Problem: Clinical Measurements: Goal: Will remain free from infection Outcome: Progressing Goal: Respiratory complications will improve Outcome: Progressing Goal: Cardiovascular complication will be avoided Outcome: Progressing   Problem: Elimination: Goal: Will not experience complications related to urinary retention Outcome: Progressing   Problem: Safety: Goal: Ability to remain free from injury will improve Outcome: Progressing   Problem: Skin Integrity: Goal: Risk for impaired skin integrity will decrease Outcome: Progressing

## 2024-01-31 NOTE — Progress Notes (Signed)
 PROGRESS NOTE    Frank Meyer  ZOX:[id number] DOB: [birth year] DOA: 01/30/2024 PCP: Suan Elm, MD    Brief Narrative:   83 y.o. 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  During recent hospitalization treated for mechanical fall, septic shock treated with antibiotics.  Now admitted for another syncopal episode, AKI, dehydration, sepsis from possible aspiration.   Assessment & Plan:  Principal Problem:   Hypotension Active Problems:   Pulmonary hypertension (HCC)   Seizures (HCC)   Dyslipidemia   Barrett's esophagus   Chronic respiratory failure with hypoxia and hypercapnia (HCC)   Hyperlipidemia   Chronic diastolic CHF (congestive heart failure) (HCC)   BPH (benign prostatic hyperplasia)   GERD (gastroesophageal reflux disease)   Mild cognitive impairment of uncertain or unknown etiology   Chronic obstructive pulmonary disease (HCC)   Malnutrition of moderate degree   Sepsis (HCC)    Severe dehydration with associated hypotension Mild AKI on CKD 3a Baseline creatinine 0.8, admission 1.37.  Likely prerenal in nature IV fluids TSH and serum cortisol are normal   ES COPD/Chronic hypoxia and 12L O2 Pulmonary hypertension Abnormal chest x-ray Recent increase in oxygen needs, now on 5 to 6 L nasal cannula.  Procalcitonin negative.  Nonspecific elevation of CRP.  Bronchodilators, I-S/flutter valve.  Discontinue antibiotics -Ordered Solu-Medrol  and Lasix     Depression/EOL Recently lost his dog about a month ago since then has been grieving and poor coping skills. Started on trazodone  recently Added Remeron at bedtime    HFpEF On Entresto    Seizure disorder Continue Keppra    BPH Continue Proscar    Early memory  loss/Alzheimer's dementia Aricept  on hold due to low blood pressure   Protein calorie malnutrition Encourage p.o. nutrition   HLD Continue statin  Goals of care discussion-no escalation of care.  Eventual goal to transition patient home with home hospice.  At this time decision made not to advance him to BiPAP or transition to ICU    DVT prophylaxis: Lovenox     Code Status: Limited: Do not attempt resuscitation (DNR) -DNR-LIMITED -Do Not Intubate/DNI  Family Communication: Spouse at bedside Status is: Inpatient Primary disposition    Subjective: Patient seen and examined at bedside.  Spouse and palliative care team at bedside Had increasing oxygen requirement this morning up to 12 L chest x-ray showing severe emphysema.  Started on Lasix  and Solu-Medrol .  Patient and family understands that there is no cure to his condition and agreeable to transitioning him to home with hospice eventually   Examination:  General exam: Appears calm and comfortable, 12 L nasal cannula, cachectic frail Respiratory system: Diminished breath sounds bilaterally Cardiovascular system: S1 & S2 heard, RRR. No JVD, murmurs, rubs, gallops or clicks. No pedal edema. Gastrointestinal system: Abdomen is nondistended, soft and nontender. No organomegaly or masses felt. Normal bowel sounds heard. Central nervous system: Alert and oriented. No focal neurological deficits. Extremities: Symmetric 5 x 5 power. Skin: No rashes, lesions or ulcers Psychiatry: Judgement and insight appear normal. Mood & affect appropriate.                Diet Orders (From admission, onward)     Start     Ordered   01/31/24 0949  Diet regular Fluid consistency: Thin  Diet effective now       Question:  Fluid consistency:  Answer:  Thin   01/31/24 0949            Objective: Vitals:   01/31/24 0427 01/31/24 0725 01/31/24 0900 01/31/24 1111  BP: 117/71 117/65    Pulse: 87 99 (!) 109 (!) 103  Resp: 20 19  19    Temp: 98.8 F (37.1 C) 98 F (36.7 C)  (!) 100.8 F (38.2 C)  TempSrc: Oral Oral    SpO2: 95% 95% (!) 88% 94%  Weight: 68.2 kg     Height:        Intake/Output Summary (Last 24 hours) at 01/31/2024 1351 Last data filed at 01/31/2024 0900 Gross per 24 hour  Intake 2283.53 ml  Output 730 ml  Net 1553.53 ml   Filed Weights   01/30/24 1259 01/30/24 1732 01/31/24 0427  Weight: 73 kg 70.8 kg 68.2 kg    Scheduled Meds:  aspirin  EC  81 mg Oral Daily   atorvastatin   20 mg Oral QPM   enoxaparin  (LOVENOX ) injection  40 mg Subcutaneous Q24H   finasteride   5 mg Oral QHS   furosemide   40 mg Intravenous BID   ipratropium-albuterol   3 mL Nebulization QID   levETIRAcetam   250 mg Oral q morning   And   levETIRAcetam   500 mg Oral QHS   methylPREDNISolone  (SOLU-MEDROL ) injection  40 mg Intravenous Q8H   mirtazapine  7.5 mg Oral QHS   pantoprazole   40 mg Oral q morning   polyethylene glycol  17 g Oral Daily   sodium chloride  flush  3 mL Intravenous Q12H   traZODone   100 mg Oral QHS   umeclidinium-vilanterol  1 puff Inhalation Daily   Continuous Infusions:  lactated ringers  75 mL (01/30/24 2314)    Nutritional status     Body mass index is 24.27 kg/m.  Data Reviewed:   CBC: Recent Labs  Lab 01/30/24 1305 01/31/24 0217  WBC 11.3* 8.8  NEUTROABS 7.2 6.1  HGB 10.0* 9.0*  HCT 31.4* 28.3*  MCV 82.2 82.0  PLT 397 299   Basic Metabolic Panel: Recent Labs  Lab 01/26/24 0832 01/30/24 1305 01/31/24 0217  NA 132* 135 137  K 3.6 4.2 3.9  CL 99 101 104  CO2 27 24 25   GLUCOSE 133* 113* 99  BUN 9 19 14   CREATININE 0.85 1.37* 1.03  CALCIUM  7.6* 8.4* 7.8*  MG  --   --  2.1   GFR: Estimated Creatinine Clearance: 49 mL/min (by C-G formula based on SCr of 1.03 mg/dL). Liver Function Tests: Recent Labs  Lab 01/30/24 1305 01/31/24 0217  AST 39 32  ALT 40 32  ALKPHOS 53 51  BILITOT 1.0 0.9  PROT 6.1* 5.0*  ALBUMIN  2.4* 2.0*   No results for input(s): LIPASE,  AMYLASE in the last 168 hours. No results for input(s): AMMONIA in the last 168 hours. Coagulation Profile: Recent Labs  Lab 01/30/24 1305  INR 1.1   Cardiac Enzymes: No results for input(s): CKTOTAL, CKMB, CKMBINDEX, TROPONINI in the last 168 hours. BNP (last 3 results) No results for input(s): PROBNP in the last 8760 hours. HbA1C: No results for input(s): HGBA1C in the last 72 hours. CBG: No results for input(s): GLUCAP in the last 168 hours. Lipid Profile: No results for input(s): CHOL, HDL, LDLCALC, TRIG, CHOLHDL, LDLDIRECT in the last 72 hours. Thyroid  Function Tests: Recent Labs    01/30/24 1305 01/30/24 1625  TSH  --  1.290  FREET4 1.21*  --    Anemia Panel: No  results for input(s): VITAMINB12, FOLATE, FERRITIN, TIBC, IRON, RETICCTPCT in the last 72 hours. Sepsis Labs: Recent Labs  Lab 01/30/24 1314 01/30/24 1540 01/30/24 1548  PROCALCITON  --  0.10  --   LATICACIDVEN 1.1  --  0.9    Recent Results (from the past 240 hours)  Resp panel by RT-PCR (RSV, Flu A&B, Covid) Anterior Nasal Swab     Status: None   Collection Time: 01/23/24 10:49 AM   Specimen: Anterior Nasal Swab  Result Value Ref Range Status   SARS Coronavirus 2 by RT PCR NEGATIVE NEGATIVE Final    Comment: (NOTE) SARS-CoV-2 target nucleic acids are NOT DETECTED.  The SARS-CoV-2 RNA is generally detectable in upper respiratory specimens during the acute phase of infection. The lowest concentration of SARS-CoV-2 viral copies this assay can detect is 138 copies/mL. A negative result does not preclude SARS-Cov-2 infection and should not be used as the sole basis for treatment or other patient management decisions. A negative result may occur with  improper specimen collection/handling, submission of specimen other than nasopharyngeal swab, presence of viral mutation(s) within the areas targeted by this assay, and inadequate number of viral copies(<138  copies/mL). A negative result must be combined with clinical observations, patient history, and epidemiological information. The expected result is Negative.  Fact Sheet for Patients:  BloggerCourse.com  Fact Sheet for Healthcare Providers:  SeriousBroker.it  This test is no t yet approved or cleared by the [place]  FDA and  has been authorized for detection and/or diagnosis of SARS-CoV-2 by FDA under an Emergency Use Authorization (EUA). This EUA will remain  in effect (meaning this test can be used) for the duration of the COVID-19 declaration under Section 564(b)(1) of the Act, 21 U.S.C.section 360bbb-3(b)(1), unless the authorization is terminated  or revoked sooner.       Influenza A by PCR NEGATIVE NEGATIVE Final   Influenza B by PCR NEGATIVE NEGATIVE Final    Comment: (NOTE) The Xpert Xpress SARS-CoV-2/FLU/RSV plus assay is intended as an aid in the diagnosis of influenza from Nasopharyngeal swab specimens and should not be used as a sole basis for treatment. Nasal washings and aspirates are unacceptable for Xpert Xpress SARS-CoV-2/FLU/RSV testing.  Fact Sheet for Patients: BloggerCourse.com  Fact Sheet for Healthcare Providers: SeriousBroker.it  This test is not yet approved or cleared by the [place]  FDA and has been authorized for detection and/or diagnosis of SARS-CoV-2 by FDA under an Emergency Use Authorization (EUA). This EUA will remain in effect (meaning this test can be used) for the duration of the COVID-19 declaration under Section 564(b)(1) of the Act, 21 U.S.C. section 360bbb-3(b)(1), unless the authorization is terminated or revoked.     Resp Syncytial Virus by PCR NEGATIVE NEGATIVE Final    Comment: (NOTE) Fact Sheet for Patients: BloggerCourse.com  Fact Sheet for Healthcare  Providers: SeriousBroker.it  This test is not yet approved or cleared by the [place]  FDA and has been authorized for detection and/or diagnosis of SARS-CoV-2 by FDA under an Emergency Use Authorization (EUA). This EUA will remain in effect (meaning this test can be used) for the duration of the COVID-19 declaration under Section 564(b)(1) of the Act, 21 U.S.C. section 360bbb-3(b)(1), unless the authorization is terminated or revoked.  Performed at [facility], [address] [place], [place] [address]   Blood culture (routine x 2)     Status: None   Collection Time: 01/23/24 11:04 AM   Specimen: BLOOD  Result Value Ref  Range Status   Specimen Description   Final    BLOOD LEFT ANTECUBITAL Performed at [facility], [address] [place], [place] [address]    Special Requests   Final    BOTTLES DRAWN AEROBIC AND ANAEROBIC Blood Culture results may not be optimal due to an excessive volume of blood received in culture bottles Performed at [facility], [address] [place], [place] [address]    Culture   Final    NO GROWTH 5 DAYS Performed at [facility], [address], [place], [place] [address]    Report Status 01/28/2024 FINAL  Final  Blood culture (routine x 2)     Status: None   Collection Time: 01/23/24 11:09 AM   Specimen: BLOOD  Result Value Ref Range Status   Specimen Description   Final    BLOOD RIGHT ANTECUBITAL Performed at [facility], [address] [place], [place] [address]    Special Requests   Final    BOTTLES DRAWN AEROBIC AND ANAEROBIC Blood Culture adequate volume Performed at [facility], [address] [place], [place] [address]    Culture   Final    NO GROWTH 5 DAYS Performed at [facility], [address] [place], [place] [address]    Report Status 01/28/2024 FINAL  Final  MRSA Next Gen by PCR, Nasal     Status: None   Collection Time:  01/23/24  4:18 PM   Specimen: Nasal Mucosa; Nasal Swab  Result Value Ref Range Status   MRSA by PCR Next Gen NOT DETECTED NOT DETECTED Final    Comment: (NOTE) The GeneXpert MRSA Assay (FDA approved for NASAL specimens only), is one component of a comprehensive MRSA colonization surveillance program. It is not intended to diagnose MRSA infection nor to guide or monitor treatment for MRSA infections. Test performance is not FDA approved in patients less than 56 years old. Performed at [facility], [address] [place] [address], [place], [place] [address]   Culture, blood (Routine x 2)     Status: None (Preliminary result)   Collection Time: 01/30/24  1:02 PM   Specimen: BLOOD RIGHT HAND  Result Value Ref Range Status   Specimen Description BLOOD RIGHT HAND  Final   Special Requests   Final    BOTTLES DRAWN AEROBIC AND ANAEROBIC Blood Culture results may not be optimal due to an inadequate volume of blood received in culture bottles   Culture   Final    NO GROWTH < 24 HOURS Performed at [facility], [address], [place], [place] [address]    Report Status PENDING  Incomplete  Culture, blood (Routine x 2)     Status: None (Preliminary result)   Collection Time: 01/30/24  1:07 PM   Specimen: BLOOD  Result Value Ref Range Status   Specimen Description BLOOD LEFT ANTECUBITAL  Final   Special Requests   Final    BOTTLES DRAWN AEROBIC AND ANAEROBIC Blood Culture adequate volume   Culture   Final    NO GROWTH < 24 HOURS Performed at [facility], [address], [place], [place] [address]    Report Status PENDING  Incomplete  Resp panel by RT-PCR (RSV, Flu A&B, Covid) Anterior Nasal Swab     Status: None   Collection Time: 01/30/24  1:49 PM   Specimen: Anterior Nasal Swab  Result Value Ref Range Status  SARS Coronavirus 2 by RT PCR NEGATIVE NEGATIVE Final   Influenza A by PCR NEGATIVE NEGATIVE Final   Influenza B by PCR NEGATIVE NEGATIVE Final    Comment: (NOTE) The Xpert  Xpress SARS-CoV-2/FLU/RSV plus assay is intended as an aid in the diagnosis of influenza from Nasopharyngeal swab specimens and should not be used as a sole basis for treatment. Nasal washings and aspirates are unacceptable for Xpert Xpress SARS-CoV-2/FLU/RSV testing.  Fact Sheet for Patients: BloggerCourse.com  Fact Sheet for Healthcare Providers: SeriousBroker.it  This test is not yet approved or cleared by the [place]  FDA and has been authorized for detection and/or diagnosis of SARS-CoV-2 by FDA under an Emergency Use Authorization (EUA). This EUA will remain in effect (meaning this test can be used) for the duration of the COVID-19 declaration under Section 564(b)(1) of the Act, 21 U.S.C. section 360bbb-3(b)(1), unless the authorization is terminated or revoked.     Resp Syncytial Virus by PCR NEGATIVE NEGATIVE Final    Comment: (NOTE) Fact Sheet for Patients: BloggerCourse.com  Fact Sheet for Healthcare Providers: SeriousBroker.it  This test is not yet approved or cleared by the [place]  FDA and has been authorized for detection and/or diagnosis of SARS-CoV-2 by FDA under an Emergency Use Authorization (EUA). This EUA will remain in effect (meaning this test can be used) for the duration of the COVID-19 declaration under Section 564(b)(1) of the Act, 21 U.S.C. section 360bbb-3(b)(1), unless the authorization is terminated or revoked.  Performed at [facility], [address] [place], [place] [address]          Radiology Studies: DG Chest Port 1 View Result Date: 01/31/2024 CLINICAL DATA:  [id number] Dyspnea [id number] EXAM: PORTABLE CHEST - 1 VIEW COMPARISON:  January 30, 2024 FINDINGS: Unchanged asymmetric right apical pleural thickening. Emphysema. Scattered reticular opacities in both lung bases, more so on the left than the right, likely due to underlying  emphysema. No lobar consolidation or pneumothorax. Blunting of both costophrenic sulci, unchanged, likely due to pleural thickening. Mild cardiomegaly. Tortuous aorta with aortic atherosclerosis. No acute fracture or destructive lesions. Multilevel thoracic osteophytosis. IMPRESSION: Redemonstrated advanced emphysema. No lobar pneumonia or pulmonary edema. Electronically Signed   By: Rance Burrows M.D.   On: 01/31/2024 12:32   DG Chest Port 1 View Result Date: 01/30/2024 CLINICAL DATA:  Sepsis EXAM: PORTABLE CHEST 1 VIEW COMPARISON:  01/23/2024 FINDINGS: Single frontal view of the chest demonstrates a stable cardiac silhouette. Stable background emphysema and asymmetric right apical pleural thickening. Developing airspace disease at the left lung base obscuring the cardiac silhouette, with trace bilateral pleural effusions noted. No pneumothorax. No acute bony abnormalities. IMPRESSION: 1. Developing left basilar airspace disease compatible with pneumonia. 2. Trace bilateral pleural effusions. 3. Chronic background emphysema and right apical pleural scarring. Electronically Signed   By: Bobbye Burrow M.D.   On: 01/30/2024 13:40           LOS: 1 day   Time spent= 35 mins    Maggie Schooner, MD [facility]  If 7PM-7AM, please contact night-coverage  01/31/2024, 1:51 PM

## 2024-01-31 NOTE — Plan of Care (Signed)
  Problem: SLP Dysphagia Goals Goal: Misc Dysphagia Goal Flowsheets (Taken 01/31/2024 0947) Misc Dysphagia Goal: Pt will consume a regualr diet and thin liquids with no overt or subtle s/s of aspiration or concern for decline in pulmonary status related to PO intake.

## 2024-01-31 NOTE — Progress Notes (Signed)
 Initial Nutrition Assessment  DOCUMENTATION CODES:   Severe malnutrition in context of chronic illness  INTERVENTION:   Encourage PO intake Room service with assist  Ensure Plus High Protein po BID, each supplement provides 350 kcal and 20 grams of protein Magic cup TID with meals, each supplement provides 290 kcal and 9 grams of protein MVI with minerals daily 100 mg Thiamine  daily  NUTRITION DIAGNOSIS:   Severe Malnutrition related to chronic illness as evidenced by energy intake < or equal to 75% for > or equal to 1 month, percent weight loss (Has lost 20 lbs, 12% weight loss in 6 months.).   GOAL:   Patient will meet greater than or equal to 90% of their needs   MONITOR:   PO intake, Supplement acceptance, Labs, Weight trends  REASON FOR ASSESSMENT:   Consult Assessment of nutrition requirement/status  ASSESSMENT:  Pt with hx of COPD on home O2, CHF, GERD, HFpEF, CKD 3a, and HLD with recent discharge from hospital 6/11 after fall with  shock, treated with antibiotics, and moderate protein calorie malnutrition. Admitted for another fall from syncopal episode, AKI, dehydration, and sepsis form possible aspiration.  Pt unavailable at time of visit, having GOC with family and palliative. History obtained through chart review.    Pt has had decreased appetite for months that has been progressively getting worse. Pt last seen by RD on 01/24/2024 for poor PO intake and RD diagnosed pt with moderate malnutrition. Also,at that time pt reports foods he used to eat were not enjoyable to him anymore. Dietary recall at that time was a normal sized breakfast (cereal with granola and raisins added) 1/2 the amount he used to eat for lunch (will now eat soup or a sandwich not both like it used to be) and an even smaller amount of his dinner. Was drinking Premier Protein shakes. RD will confirm intake on FUP.   Per conversation between NP and pt. Pt has had poor PO intake for multiple weeks  that has been exacerbated from his pet dog passing away. Has no appetite. COPD has been worsening, pt has been requiring increased home O2.   Exam deferred to follow up. Patient has been losing weight within the last 6 months. Going from 172 lbs in 07/2023 to now 150 lbs, 20 lbs, 12% weight loss in 6 months. Pt meets criteria for severe malnutrition based on his weight loss within the last 6 months and his Poor PO intake for at least 1 month.   Pt was agreeable to Ensures last admission. RD will add ONS as appropriate. Per GOC, no escalation of care. Eventual goal to transition patient home with home hospice. Started on Mirtazapine yesterday.   Admit weight: 73 kg Current weight: 68.2 kg   Average Meal Intake: 6/16: 100% intake x 1 recorded meals - 2 pancakes   Nutritionally Relevant Medications: Scheduled Meds:  aspirin  EC  81 mg Oral Daily   atorvastatin   20 mg Oral QPM   enoxaparin  (LOVENOX ) injection  40 mg Subcutaneous Q24H   feeding supplement  237 mL Oral BID BM   finasteride   5 mg Oral QHS   furosemide   40 mg Intravenous BID   mirtazapine  7.5 mg Oral QHS   multivitamin with minerals  1 tablet Oral Daily   thiamine   100 mg Oral Daily   Continuous Infusions:  lactated ringers  75 mL (01/30/24 2314)   Labs Reviewed: Calcium  7.8 Albumin  2 Total protein 5 CBG ranges from 99-113 mg/dL over the  last 24 hours  NUTRITION - FOCUSED PHYSICAL EXAM:  - Deferred to follow up   Diet Order:   Diet Order             Diet regular Fluid consistency: Thin  Diet effective now                   EDUCATION NEEDS:   Not appropriate for education at this time  Skin:  Skin Assessment: Reviewed RN Assessment  Last BM:  PTA  Height:   Ht Readings from Last 1 Encounters:  01/30/24 5' 6 (1.676 m)    Weight:   Wt Readings from Last 1 Encounters:  01/31/24 68.2 kg    BMI:  Body mass index is 24.27 kg/m.  Estimated Nutritional Needs:   Kcal:  1600-1800  kcal  Protein:  80-100 gm  Fluid:  >1.6L/day   Frederik Jansky, RD Registered Dietitian  See Amion for more information

## 2024-01-31 NOTE — Consult Note (Signed)
 Consultation Note Date: 01/31/2024   Patient Name: Frank Meyer  DOB: 1940-12-11  MRN: [id number]  Age / Sex: 83 y.o., male  PCP: Suan Elm, MD Referring Physician: Maggie Schooner, MD  Reason for Consultation: Establishing goals of care  HPI/Patient Profile: 83 y.o. 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   Patient has had issues with recurrent hospitalizations Was just hospitalized earlier this month from 01/23/2024 through 01/26/2024 for septic shock due to pneumonia, severe COPD, and acute on chronic respiratory failure with hypoxia.  Now readmitted after having several falls at home.  Noted to be hypotensive and now with increased O2 requirements.  Today, he states he is feeling okay.  Does get short of breath with minimal exertion.  Denies any pain.  He does have a productive cough and notes increase sputum.  Spoke with nursing regarding patient.  They do note increased O2 requirements and states that he is now requiring 10 L via nasal cannula to maintain saturations.  No respiratory distress reported.  Patient wife has been at the bedside most of the time during the day.   PMT has been consulted to assist with goals of care conversation.  Clinical Assessment and Goals of Care:  I have reviewed medical records including EPIC notes, labs (independently reviewed) and imaging, documentation from prior hospital encounters, reviewed care everywhere encounters including PCP notes, and hospice records.  I also spoke in person with  Dr. Ariel Begun (primary hospitalist) at bedside.  Assessed the patient and then obtain permission to discuss diagnosis prognosis, GOC, EOL wishes, disposition  and options.  Wife also at bedside and present for the discussion.  I introduced Palliative Medicine as specialized medical care for people living with serious illness. It focuses on providing relief from the symptoms and stress of a serious illness. The goal is to improve quality of life for both the patient and the family.  We discussed a brief life review of the patient and then focused on their current illness.   I attempted to elicit values and goals of care important to the patient.    Medical History Review and Family/Patient Understanding:   Patient and wife seem to have a good understanding of where he is with his health.  He shares that his breathing has been getting worse by the day.  He notes that while he has had recurrent hospitalizations and they will stabilize his health but when he gets home he is worse than he was before.  Education provided about disease trajectory of COPD.  They understand that the COPD is not curable and that he is at the end stages of COPD.  Social History: Patient resides at home with his wife.  They have 2 daughters.  1 lives in [place]  state and 1 lives locally.  They are familiar with the palliative as they have been followed by the palliative team as an outpatient.  They also share they have recently lost their dog of 14 years who was a close companion of the patient.  This has been very hard both on the patient and his wife and they have both been feeling quite down about this loss.  Functional and Nutritional State: Prior to this hospitalization, he was ambulating independently but having issues.  Wife states  that he had to do things very slowly and he would tire very easily.  He also had significant shortness of breath and would have to turn his oxygen up to 10 L when doing tasks as simple as getting dressed due to the shortness of breath.  They obtained a bedside commode but patiently initially preferred to use the bathroom.  He has also had  increased falls.  He has had severely reduced appetite over the past few weeks/month.  He may sit down to eat but can only eat a few bites before he loses his appetite.  He also fatigues very easily.  There appeared to be several areas where additional DME may be beneficial.  Palliative Symptoms: Shortness of breath, cough, insomnia, depression, sensation of urge to defecate with only small amounts.   Advance Directives: A detailed discussion regarding advanced directives was had.  See below.  Code Status: He confirms desire to be DNR/DNI  Discussion:  We discussed his goals of care.  He feels like his quality of life is very poor.  His goal is to not come back to the hospital and avoid suffering.  His wife goal is also for him to be comfortable and avoid suffering.  He expresses the worry of being a burden on his wife and is concerned about her ability to care for him as his health declines.  His wife hopes to keep him at home and allow him to pass in the home and he states that is fine with him as long as she is able to manage that but he would also be willing to go to a hospice facility if she was not able to manage his care at home.  He is very concerned about putting too much on her.  His main goal is to be comfortable at this point.  The difference between aggressive medical intervention and comfort care was considered in light of the patient's goals of care.  We also extensively discussed hospice and the services they provide as well as admission criteria and settings where hospice could occur.    We also discussed that while we are hopeful his health will improve/stabilize enough to get him home, we are worried that his health could decline and could decline rapidly.  We discussed that if he were to further decline what his goals would be.  Discussed the various treatment paths.  After this education/discussion, he states that he would not want any escalation in his care.  He does not wish  to go to the ICU.  He does not wish for ventilatory support including BiPAP and intubation.  Willing to try high flow nasal cannula but would not want escalation of care beyond this.  If he were to decline, he prefers to transition to full comfort care with aggressive symptom management.  His wife is in agreement with this plan.  Fortunately, hospitalist was also present for part of this conversation.  He was able to review the updated chest x-ray from today.  He noted increased fluid on the lungs and shared that he recommends steroids and Lasix  to address this.  With permission, I shared our conversation up to that point which wife confirmed was accurate.  Therefore, with the information about his goals of care, we discussed that ideally the plan would be to get him feeling better and with decreased oxygen needs and then get him home with hospice.   Discussed the importance of continued conversation with family and the medical providers regarding  overall plan of care and treatment options, ensuring decisions are within the context of the patient's values and GOCs.   Questions and concerns were addressed.  The family was encouraged to call with questions or concerns.  PMT will continue to support follow to address goals of care and manage symptoms.   PATIENT and if unable to speak for himself his wife is who he wishes to make decisions for him.  His wife states that they both have living wills and that she is his healthcare power of attorney.  She shares they do out of documentation but it is at home.  Also educated wife about patient's risk for shortness of breath and anxiety given his end-stage COPD dx.  Discussing the risk and benefits of initiating comfort medicines including opioids for shortness of breath/air hunger and benzos for anxiety.  Wife verbalizes understanding and is agreeable to the PRN use of these medications to relieve suffering and promote comfort.   I spent 30 minutes providing  separately identifiable ACP services with the patient and/or surrogate decision maker in a voluntary, in-person conversation discussing the patient's wishes and goals as detailed in the above note.     SUMMARY OF RECOMMENDATIONS    Ideally patient would like to transition home with hospice support with a goal of avoiding any further hospitalizations and with a focus on comfort/improving his quality of life.  Unfortunately, he is requiring quite a bit of O2 at this point and would not be safe to go home at this point.  Using shared decision making, plan will be to administer Lasix  and steroids with the hope of improving his symptoms and respiratory status and after transition home with hospice.  If he were to have a further decline, he does not desire any escalations in care and at that point would prefer transition to full comfort measures and support through the end-of-life.  Patient's wife is at the bedside and is fully in agreement with this plan.  Code Status/Advance Care Planning: DNR/DNI, continue to try to tx COPD exacerbation with goal to return home, if he decompensates shift to full comfort  Symptom Management:  Ordered morphine 2 mg IV q 2 hrs PRN SOB/pain Ordered Ativan 1 mg IV q 4 hrs PRN anxiety/agitation  Lasix  for volume overload Steroids for shortness of breath Zofran  as needed nausea Tylenol  as needed Mirtazapine 7.5 mg started nightly to address depression, decreased appetite, and difficulty sleeping-agree this is appropriate Continue trazodone  for sleep support as well Robitussin as needed If steroids/Lasix  ineffective may be appropriate for opioid/benzo for     Prognosis:  Unable to determine less than 6 months, if limited response to Lasix /steroids would anticipate less than 2 weeks  Discharge Planning: Ideally home with hospice, if unable to stabilize likely in hospital death     Primary Diagnoses: Present on Admission:  Hypotension  Pulmonary hypertension  (HCC)  BPH (benign prostatic hyperplasia)  Barrett's esophagus  Chronic diastolic CHF (congestive heart failure) (HCC)  Chronic respiratory failure with hypoxia and hypercapnia (HCC)  Chronic obstructive pulmonary disease (HCC)  Dyslipidemia  GERD (gastroesophageal reflux disease)  Hyperlipidemia  Malnutrition of moderate degree  Mild cognitive impairment of uncertain or unknown etiology    Physical Exam Constitutional:      Appearance: He is ill-appearing.  Pulmonary:     Effort: Tachypnea present. No respiratory distress.   Skin:    General: Skin is warm and dry.     Comments: Finger tips appear cyanotic but warm  Neurological:     Mental Status: He is alert.     Vital Signs: BP 117/65 (BP Location: Left Arm)   Pulse (!) 103   Temp (!) 100.8 F (38.2 C)   Resp 19   Ht 5' 6 (1.676 m)   Wt 68.2 kg   SpO2 94%   BMI 24.27 kg/m  Pain Scale: 0-10   Pain Score: 0-No pain   SpO2: SpO2: 94 % O2 Device:SpO2: 94 % O2 Flow Rate: .O2 Flow Rate (L/min): 12 L/min   Palliative Assessment/Data:     Billing based on MDM: High  Problems Addressed: One acute or chronic illness or injury that poses a threat to life or bodily function  Amount and/or Complexity of Data: Category 2:Independent interpretation of a test performed by another physician/other qualified health care professional (not separately reported)  Risks: Parenteral controlled substances and Decision not to resuscitate or to de-escalate care because of poor prognosis   Render Carrie, NP  Palliative Medicine Team Team phone # [phone]  Thank you for allowing the Palliative Medicine Team to assist in the care of this patient. Please utilize secure chat with additional questions, if there is no response within 30 minutes please call the above phone number.  Palliative Medicine Team providers are available by phone from 7am to 7pm daily and can be reached through the team cell phone.  Should  this patient require assistance outside of these hours, please call the patient's attending physician.

## 2024-01-31 NOTE — Progress Notes (Signed)
   I met with pt and his wife today to discuss hospice care at home. We were able to have goals of care discussion and the pt is wanting to go home with hospice service. He has a 10 L concentrator at home we are recommending a second 10L concentrator in event that he will have increasing oxygen demands and we can accommodate this at home if needed. This has been ordered to be delivered. He owns BSC, and nebulizer. We recommended w/c and rollator but they are considering these items at this time.   We will continue to follow and assist with d/c home under hospice care when he is felt to be ready for d/c.  Lyla Samuels RN [phone]

## 2024-01-31 NOTE — TOC Progression Note (Signed)
 Transition of Care [facility]) - Progression Note    Patient Details  Name: Frank Meyer MRN: [id number] Date of Birth: [birth year]  Transition of Care [facility]) CM/SW Contact  Jennett Model, RN Phone Number: 01/31/2024, 12:39 PM  Clinical Narrative:    Per Cheri with [facility], she will be meeting with patient and wife at 3 pm today.         Expected Discharge Plan and Services                                               Social Determinants of Health (SDOH) Interventions SDOH Screenings   Food Insecurity: No Food Insecurity (01/30/2024)  Housing: Low Risk  (01/30/2024)  Transportation Needs: No Transportation Needs (01/30/2024)  Utilities: Not At Risk (01/30/2024)  Depression (PHQ2-9): Low Risk  (01/08/2023)  Social Connections: Moderately Isolated (01/30/2024)  Tobacco Use: Medium Risk (01/30/2024)    Readmission Risk Interventions    10/30/2022   12:53 PM  Readmission Risk Prevention Plan  Transportation Screening Complete  PCP or Specialist Appt within 3-5 Days Complete  HRI or Home Care Consult Complete  Palliative Care Screening Not Applicable  Medication Review (RN Care Manager) Complete

## 2024-02-01 DIAGNOSIS — I950 Idiopathic hypotension: Secondary | ICD-10-CM | POA: Diagnosis not present

## 2024-02-01 DIAGNOSIS — E43 Unspecified severe protein-calorie malnutrition: Secondary | ICD-10-CM | POA: Insufficient documentation

## 2024-02-01 LAB — CBC
HCT: 31.4 % — ABNORMAL LOW (ref 39.0–52.0)
Hemoglobin: 9.8 g/dL — ABNORMAL LOW (ref 13.0–17.0)
MCH: 25.5 pg — ABNORMAL LOW (ref 26.0–34.0)
MCHC: 31.2 g/dL (ref 30.0–36.0)
MCV: 81.6 fL (ref 80.0–100.0)
Platelets: 344 K/uL (ref 150–400)
RBC: 3.85 MIL/uL — ABNORMAL LOW (ref 4.22–5.81)
RDW: 13.8 % (ref 11.5–15.5)
WBC: 7.1 K/uL (ref 4.0–10.5)
nRBC: 0 % (ref 0.0–0.2)

## 2024-02-01 LAB — BASIC METABOLIC PANEL WITH GFR
Anion gap: 6 (ref 5–15)
BUN: 15 mg/dL (ref 8–23)
CO2: 28 mmol/L (ref 22–32)
Calcium: 7.7 mg/dL — ABNORMAL LOW (ref 8.9–10.3)
Chloride: 101 mmol/L (ref 98–111)
Creatinine, Ser: 0.95 mg/dL (ref 0.61–1.24)
GFR, Estimated: >60 mL/min (ref >60)
Glucose, Bld: 183 mg/dL — ABNORMAL HIGH (ref 70–99)
Potassium: 3.6 mmol/L (ref 3.5–5.1)
Sodium: 135 mmol/L (ref 135–145)

## 2024-02-01 LAB — BRAIN NATRIURETIC PEPTIDE: B Natriuretic Peptide: 910 pg/mL — ABNORMAL HIGH (ref 0.0–100.0)

## 2024-02-01 LAB — MAGNESIUM: Magnesium: 2.2 mg/dL (ref 1.7–2.4)

## 2024-02-01 MED ORDER — ORAL CARE MOUTH RINSE: 15.0000 mL | OROMUCOSAL | Status: DC | PRN

## 2024-02-01 NOTE — Progress Notes (Signed)
 PROGRESS NOTE    Frank Meyer  ZOX:[id number] DOB: 08/17/1941 DOA: 01/30/2024 PCP: Suan Elm, MD    Brief Narrative:   83 y.o. 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  During recent hospitalization treated for mechanical fall, septic shock treated with antibiotics.  Now admitted for another syncopal episode, AKI, dehydration, sepsis from possible aspiration.    Assessment & Plan:  Principal Problem:   Hypotension Active Problems:   Pulmonary hypertension (HCC)   Seizures (HCC)   Dyslipidemia   Barrett's esophagus   Chronic respiratory failure with hypoxia and hypercapnia (HCC)   Hyperlipidemia   Chronic diastolic CHF (congestive heart failure) (HCC)   BPH (benign prostatic hyperplasia)   GERD (gastroesophageal reflux disease)   Mild cognitive impairment of uncertain or unknown etiology   Chronic obstructive pulmonary disease (HCC)   Malnutrition of moderate degree   Sepsis (HCC)    Severe dehydration with associated hypotension Mild AKI on CKD 3a Baseline creatinine 0.8, admission 1.37.  Likely prerenal in nature IV fluids TSH and serum cortisol are normal   Acute on chronic hypoxic respiratory failure Acute COPD exacerbation Pulmonary hypertension Abnormal chest x-ray Recent increase in oxygen needs, now on 5 to 6 L nasal cannula at baseline but today at 9 L nasal cannula.  Procalcitonin negative.  Nonspecific elevation of CRP.  Bronchodilators, I-S/flutter valve.  Discontinue antibiotics -Ordered Solu-Medrol  and Lasix     Depression/EOL Recently lost his dog about a month ago since then has been grieving and poor coping skills. Started on trazodone  recently Added Remeron at bedtime    HFpEF On Entresto     Seizure disorder Continue Keppra    BPH Continue Proscar    Early memory loss/Alzheimer's dementia Aricept  on hold due to low blood pressure   Protein calorie malnutrition Encourage p.o. nutrition   HLD Continue statin  Goals of care discussion-no escalation of care.  Eventual goal to transition patient home with home hospice.  At this time decision made not to advance him to BiPAP or transition to ICU    DVT prophylaxis: Lovenox     Code Status: Limited: Do not attempt resuscitation (DNR) -DNR-LIMITED -Do Not Intubate/DNI  Family Communication: Spouse at bedside Status is: Inpatient Hopefully transition home with hospice in next 24-48 hours    Subjective: Tells me he feels a little better after getting diuretics and steroids.  On 9 L nasal cannula Spouse at bedside Examination:  General exam: Appears calm and comfortable, 9 L nasal cannula, cachectic frail Respiratory system: Diminished breath sounds bilaterally Cardiovascular system: S1 & S2 heard, RRR. No JVD, murmurs, rubs, gallops or clicks. No pedal edema. Gastrointestinal system: Abdomen is nondistended, soft and nontender. No organomegaly or masses felt. Normal bowel sounds heard. Central nervous system: Alert and oriented. No focal neurological deficits. Extremities: Symmetric 5 x 5 power. Skin: No rashes, lesions or ulcers Psychiatry: Judgement and insight appear normal. Mood & affect appropriate.                Diet Orders (From admission, onward)     Start     Ordered   01/31/24 1502  Diet regular Room service appropriate? Yes with Assist; Fluid consistency: Thin  Diet effective now       Question Answer Comment  Room service appropriate? Yes with Assist   Fluid consistency:  Thin      01/31/24 1501            Objective: Vitals:   02/01/24 0422 02/01/24 0426 02/01/24 0735 02/01/24 0829  BP: (!) 100/54  106/75   Pulse: 72  88   Resp: 18  (!) 29   Temp: 97.6 F (36.4 C)  97.7 F (36.5  C)   TempSrc: Oral  Oral   SpO2: 100%  (!) 84% 91%  Weight:  69.1 kg    Height:        Intake/Output Summary (Last 24 hours) at 02/01/2024 1037 Last data filed at 02/01/2024 0900 Gross per 24 hour  Intake 497.82 ml  Output 1150 ml  Net -652.18 ml   Filed Weights   01/30/24 1732 01/31/24 0427 02/01/24 0426  Weight: 70.8 kg 68.2 kg 69.1 kg    Scheduled Meds:  aspirin  EC  81 mg Oral Daily   atorvastatin   20 mg Oral QPM   enoxaparin  (LOVENOX ) injection  40 mg Subcutaneous Q24H   feeding supplement  237 mL Oral BID BM   finasteride   5 mg Oral QHS   furosemide   40 mg Intravenous BID   ipratropium-albuterol   3 mL Nebulization TID   levETIRAcetam   250 mg Oral q morning   And   levETIRAcetam   500 mg Oral QHS   methylPREDNISolone  (SOLU-MEDROL ) injection  40 mg Intravenous Q8H   mirtazapine  7.5 mg Oral QHS   multivitamin with minerals  1 tablet Oral Daily   pantoprazole   40 mg Oral q morning   polyethylene glycol  17 g Oral Daily   sodium chloride  flush  3 mL Intravenous Q12H   thiamine   100 mg Oral Daily   traZODone   100 mg Oral QHS   umeclidinium-vilanterol  1 puff Inhalation Daily   Continuous Infusions:  lactated ringers  75 mL (01/30/24 2314)    Nutritional status Signs/Symptoms: energy intake < or equal to 75% for > or equal to 1 month, percent weight loss (Has lost 20 lbs, 12% weight loss in 6 months.) Percent weight loss: 12 % Interventions: Refer to RD note for recommendations Body mass index is 24.59 kg/m.  Data Reviewed:   CBC: Recent Labs  Lab 01/30/24 1305 01/31/24 0217 02/01/24 0232  WBC 11.3* 8.8 7.1  NEUTROABS 7.2 6.1  --   HGB 10.0* 9.0* 9.8*  HCT 31.4* 28.3* 31.4*  MCV 82.2 82.0 81.6  PLT 397 299 344   Basic Metabolic Panel: Recent Labs  Lab 01/26/24 0832 01/30/24 1305 01/31/24 0217 02/01/24 0232  NA 132* 135 137 135  K 3.6 4.2 3.9 3.6  CL 99 101 104 101  CO2 27 24 25 28   GLUCOSE 133* 113* 99 183*  BUN 9 19 14 15   CREATININE 0.85  1.37* 1.03 0.95  CALCIUM  7.6* 8.4* 7.8* 7.7*  MG  --   --  2.1 2.2   GFR: Estimated Creatinine Clearance: 53.2 mL/min (by C-G formula based on SCr of 0.95 mg/dL). Liver Function Tests: Recent Labs  Lab 01/30/24 1305 01/31/24 0217  AST 39 32  ALT 40 32  ALKPHOS 53 51  BILITOT 1.0 0.9  PROT 6.1* 5.0*  ALBUMIN  2.4* 2.0*   No results for input(s): LIPASE, AMYLASE in the last 168 hours. No results for input(s): AMMONIA in the last 168 hours. Coagulation Profile: Recent Labs  Lab 01/30/24 1305  INR 1.1   Cardiac Enzymes: No results for input(s): CKTOTAL, CKMB, CKMBINDEX, TROPONINI in the last 168 hours. BNP (last 3  results) No results for input(s): PROBNP in the last 8760 hours. HbA1C: No results for input(s): HGBA1C in the last 72 hours. CBG: No results for input(s): GLUCAP in the last 168 hours. Lipid Profile: No results for input(s): CHOL, HDL, LDLCALC, TRIG, CHOLHDL, LDLDIRECT in the last 72 hours. Thyroid  Function Tests: Recent Labs    01/30/24 1305 01/30/24 1625  TSH  --  1.290  FREET4 1.21*  --    Anemia Panel: No results for input(s): VITAMINB12, FOLATE, FERRITIN, TIBC, IRON, RETICCTPCT in the last 72 hours. Sepsis Labs: Recent Labs  Lab 01/30/24 1314 01/30/24 1540 01/30/24 1548  PROCALCITON  --  0.10  --   LATICACIDVEN 1.1  --  0.9    Recent Results (from the past 240 hours)  Resp panel by RT-PCR (RSV, Flu A&B, Covid) Anterior Nasal Swab     Status: None   Collection Time: 01/23/24 10:49 AM   Specimen: Anterior Nasal Swab  Result Value Ref Range Status   SARS Coronavirus 2 by RT PCR NEGATIVE NEGATIVE Final    Comment: (NOTE) SARS-CoV-2 target nucleic acids are NOT DETECTED.  The SARS-CoV-2 RNA is generally detectable in upper respiratory specimens during the acute phase of infection. The lowest concentration of SARS-CoV-2 viral copies this assay can detect is 138 copies/mL. A negative result does not  preclude SARS-Cov-2 infection and should not be used as the sole basis for treatment or other patient management decisions. A negative result may occur with  improper specimen collection/handling, submission of specimen other than nasopharyngeal swab, presence of viral mutation(s) within the areas targeted by this assay, and inadequate number of viral copies(<138 copies/mL). A negative result must be combined with clinical observations, patient history, and epidemiological information. The expected result is Negative.  Fact Sheet for Patients:  BloggerCourse.com  Fact Sheet for Healthcare Providers:  SeriousBroker.it  This test is no t yet approved or cleared by the [place]  FDA and  has been authorized for detection and/or diagnosis of SARS-CoV-2 by FDA under an Emergency Use Authorization (EUA). This EUA will remain  in effect (meaning this test can be used) for the duration of the COVID-19 declaration under Section 564(b)(1) of the Act, 21 U.S.C.section 360bbb-3(b)(1), unless the authorization is terminated  or revoked sooner.       Influenza A by PCR NEGATIVE NEGATIVE Final   Influenza B by PCR NEGATIVE NEGATIVE Final    Comment: (NOTE) The Xpert Xpress SARS-CoV-2/FLU/RSV plus assay is intended as an aid in the diagnosis of influenza from Nasopharyngeal swab specimens and should not be used as a sole basis for treatment. Nasal washings and aspirates are unacceptable for Xpert Xpress SARS-CoV-2/FLU/RSV testing.  Fact Sheet for Patients: BloggerCourse.com  Fact Sheet for Healthcare Providers: SeriousBroker.it  This test is not yet approved or cleared by the [place]  FDA and has been authorized for detection and/or diagnosis of SARS-CoV-2 by FDA under an Emergency Use Authorization (EUA). This EUA will remain in effect (meaning this test can be used) for the  duration of the COVID-19 declaration under Section 564(b)(1) of the Act, 21 U.S.C. section 360bbb-3(b)(1), unless the authorization is terminated or revoked.     Resp Syncytial Virus by PCR NEGATIVE NEGATIVE Final    Comment: (NOTE) Fact Sheet for Patients: BloggerCourse.com  Fact Sheet for Healthcare Providers: SeriousBroker.it  This test is not yet approved or cleared by the [place]  FDA and has been authorized for detection and/or diagnosis of SARS-CoV-2 by FDA under an Emergency Use Authorization (EUA).  This EUA will remain in effect (meaning this test can be used) for the duration of the COVID-19 declaration under Section 564(b)(1) of the Act, 21 U.S.C. section 360bbb-3(b)(1), unless the authorization is terminated or revoked.  Performed at [facility], [address] [place], [place] [address]   Blood culture (routine x 2)     Status: None   Collection Time: 01/23/24 11:04 AM   Specimen: BLOOD  Result Value Ref Range Status   Specimen Description   Final    BLOOD LEFT ANTECUBITAL Performed at [facility], [address] [place], [place] [address]    Special Requests   Final    BOTTLES DRAWN AEROBIC AND ANAEROBIC Blood Culture results may not be optimal due to an excessive volume of blood received in culture bottles Performed at [facility], [address] [place], [place] [address]    Culture   Final    NO GROWTH 5 DAYS Performed at [facility], [address], [place], [place] [address]    Report Status 01/28/2024 FINAL  Final  Blood culture (routine x 2)     Status: None   Collection Time: 01/23/24 11:09 AM   Specimen: BLOOD  Result Value Ref Range Status   Specimen Description   Final    BLOOD RIGHT ANTECUBITAL Performed at [facility], [address] [place], [place] [address]    Special Requests   Final    BOTTLES DRAWN AEROBIC AND ANAEROBIC  Blood Culture adequate volume Performed at [facility], [address] [place], [place] [address]    Culture   Final    NO GROWTH 5 DAYS Performed at [facility], [address], [place], [place] [address]    Report Status 01/28/2024 FINAL  Final  MRSA Next Gen by PCR, Nasal     Status: None   Collection Time: 01/23/24  4:18 PM   Specimen: Nasal Mucosa; Nasal Swab  Result Value Ref Range Status   MRSA by PCR Next Gen NOT DETECTED NOT DETECTED Final    Comment: (NOTE) The GeneXpert MRSA Assay (FDA approved for NASAL specimens only), is one component of a comprehensive MRSA colonization surveillance program. It is not intended to diagnose MRSA infection nor to guide or monitor treatment for MRSA infections. Test performance is not FDA approved in patients less than 52 years old. Performed at [facility], [address] [place], [place] [address]   Culture, blood (Routine x 2)     Status: None (Preliminary result)   Collection Time: 01/30/24  1:02 PM   Specimen: BLOOD RIGHT HAND  Result Value Ref Range Status   Specimen Description BLOOD RIGHT HAND  Final   Special Requests   Final    BOTTLES DRAWN AEROBIC AND ANAEROBIC Blood Culture results may not be optimal due to an inadequate volume of blood received in culture bottles   Culture   Final    NO GROWTH 2 DAYS Performed at [facility], [address], [place], [place] [address]    Report Status PENDING  Incomplete  Culture, blood (Routine x 2)     Status: None (Preliminary result)   Collection Time: 01/30/24  1:07 PM   Specimen: BLOOD  Result Value Ref Range Status   Specimen Description BLOOD LEFT ANTECUBITAL  Final   Special Requests   Final    BOTTLES DRAWN AEROBIC AND ANAEROBIC  Blood Culture adequate volume   Culture   Final    NO GROWTH 2 DAYS Performed at [facility], [address] [place], [place] [address]    Report Status PENDING  Incomplete  Resp panel by RT-PCR (RSV,  Flu A&B, Covid) Anterior Nasal Swab     Status: None   Collection Time: 01/30/24  1:49 PM   Specimen: Anterior Nasal Swab  Result Value Ref Range Status   SARS Coronavirus 2 by RT PCR NEGATIVE NEGATIVE Final   Influenza A by PCR NEGATIVE NEGATIVE Final   Influenza B by PCR NEGATIVE NEGATIVE Final    Comment: (NOTE) The Xpert Xpress SARS-CoV-2/FLU/RSV plus assay is intended as an aid in the diagnosis of influenza from Nasopharyngeal swab specimens and should not be used as a sole basis for treatment. Nasal washings and aspirates are unacceptable for Xpert Xpress SARS-CoV-2/FLU/RSV testing.  Fact Sheet for Patients: BloggerCourse.com  Fact Sheet for Healthcare Providers: SeriousBroker.it  This test is not yet approved or cleared by the [place]  FDA and has been authorized for detection and/or diagnosis of SARS-CoV-2 by FDA under an Emergency Use Authorization (EUA). This EUA will remain in effect (meaning this test can be used) for the duration of the COVID-19 declaration under Section 564(b)(1) of the Act, 21 U.S.C. section 360bbb-3(b)(1), unless the authorization is terminated or revoked.     Resp Syncytial Virus by PCR NEGATIVE NEGATIVE Final    Comment: (NOTE) Fact Sheet for Patients: BloggerCourse.com  Fact Sheet for Healthcare Providers: SeriousBroker.it  This test is not yet approved or cleared by the [place]  FDA and has been authorized for detection and/or diagnosis of SARS-CoV-2 by FDA under an Emergency Use Authorization (EUA). This EUA will remain in effect (meaning this test can be used) for the duration of the COVID-19 declaration under Section 564(b)(1) of the Act, 21 U.S.C. section 360bbb-3(b)(1), unless the authorization is terminated or revoked.  Performed at [facility], [address] [place], [place] [address]          Radiology  Studies: DG Chest Port 1 View Result Date: 01/31/2024 CLINICAL DATA:  [id number] Dyspnea [id number] EXAM: PORTABLE CHEST - 1 VIEW COMPARISON:  January 30, 2024 FINDINGS: Unchanged asymmetric right apical pleural thickening. Emphysema. Scattered reticular opacities in both lung bases, more so on the left than the right, likely due to underlying emphysema. No lobar consolidation or pneumothorax. Blunting of both costophrenic sulci, unchanged, likely due to pleural thickening. Mild cardiomegaly. Tortuous aorta with aortic atherosclerosis. No acute fracture or destructive lesions. Multilevel thoracic osteophytosis. IMPRESSION: Redemonstrated advanced emphysema. No lobar pneumonia or pulmonary edema. Electronically Signed   By: Rance Burrows M.D.   On: 01/31/2024 12:32   DG Chest Port 1 View Result Date: 01/30/2024 CLINICAL DATA:  Sepsis EXAM: PORTABLE CHEST 1 VIEW COMPARISON:  01/23/2024 FINDINGS: Single frontal view of the chest demonstrates a stable cardiac silhouette. Stable background emphysema and asymmetric right apical pleural thickening. Developing airspace disease at the left lung base obscuring the cardiac silhouette, with trace bilateral pleural effusions noted. No pneumothorax. No acute bony abnormalities. IMPRESSION: 1. Developing left basilar airspace disease compatible with pneumonia. 2. Trace bilateral pleural effusions. 3. Chronic background emphysema and right apical pleural scarring. Electronically Signed   By: Bobbye Burrow M.D.   On: 01/30/2024 13:40           LOS: 2 days   Time spent= 35 mins    Maggie Schooner, MD [facility]  If 7PM-7AM, please contact night-coverage  02/01/2024, 10:37 AM

## 2024-02-01 NOTE — Progress Notes (Signed)
 SLP Cancellation Note  Patient Details Name: Frank Meyer MRN: [id number] DOB: 11-07-1940   Cancelled treatment:       Reason Eval/Treat Not Completed: Other (comment). Reviewed plan of care discussions. No need for MBS at this time. Pt to continue regular diet and thin liquids of choice. SLP will sign off.    Haileyann Staiger, Hardin Leys 02/01/2024, 8:46 AM

## 2024-02-02 DIAGNOSIS — I951 Orthostatic hypotension: Secondary | ICD-10-CM | POA: Diagnosis not present

## 2024-02-02 LAB — CBC
HCT: 29.2 % — ABNORMAL LOW (ref 39.0–52.0)
Hemoglobin: 9.3 g/dL — ABNORMAL LOW (ref 13.0–17.0)
MCH: 26.1 pg (ref 26.0–34.0)
MCHC: 31.8 g/dL (ref 30.0–36.0)
MCV: 81.8 fL (ref 80.0–100.0)
Platelets: 376 10*3/uL (ref 150–400)
RBC: 3.57 MIL/uL — ABNORMAL LOW (ref 4.22–5.81)
RDW: 13.8 % (ref 11.5–15.5)
WBC: 19.9 10*3/uL — ABNORMAL HIGH (ref 4.0–10.5)
nRBC: 0 % (ref 0.0–0.2)

## 2024-02-02 LAB — BASIC METABOLIC PANEL WITH GFR
Anion gap: 10 (ref 5–15)
BUN: 24 mg/dL — ABNORMAL HIGH (ref 8–23)
CO2: 28 mmol/L (ref 22–32)
Calcium: 7.8 mg/dL — ABNORMAL LOW (ref 8.9–10.3)
Chloride: 99 mmol/L (ref 98–111)
Creatinine, Ser: 0.95 mg/dL (ref 0.61–1.24)
GFR, Estimated: >60 mL/min (ref >60)
Glucose, Bld: 168 mg/dL — ABNORMAL HIGH (ref 70–99)
Potassium: 3.5 mmol/L (ref 3.5–5.1)
Sodium: 137 mmol/L (ref 135–145)

## 2024-02-02 LAB — MAGNESIUM: Magnesium: 2.2 mg/dL (ref 1.7–2.4)

## 2024-02-02 NOTE — Plan of Care (Signed)
 Pt heart rate and blood pressure improving to normal limits

## 2024-02-02 NOTE — Progress Notes (Signed)
 PROGRESS NOTE    Frank Meyer  ZOX:[id number] DOB: 04/07/1941 DOA: 01/30/2024 PCP: Suan Elm, MD    Brief Narrative:   83 y.o. 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  During recent hospitalization treated for mechanical fall, septic shock treated with antibiotics.  Now admitted for another syncopal episode, AKI, dehydration, sepsis from possible aspiration.    Assessment & Plan:  Principal Problem:   Hypotension Active Problems:   Pulmonary hypertension (HCC)   Seizures (HCC)   Dyslipidemia   Barrett's esophagus   Chronic respiratory failure with hypoxia and hypercapnia (HCC)   Hyperlipidemia   Chronic diastolic CHF (congestive heart failure) (HCC)   BPH (benign prostatic hyperplasia)   GERD (gastroesophageal reflux disease)   Mild cognitive impairment of uncertain or unknown etiology   Chronic obstructive pulmonary disease (HCC)   Malnutrition of moderate degree   Sepsis (HCC)    Severe dehydration with associated hypotension Mild AKI on CKD 3a Baseline creatinine 0.8, admission creatinine 1.37, now back to baseline with diuretics   Acute on chronic hypoxic respiratory failure Acute COPD exacerbation Pulmonary hypertension Abnormal chest x-ray Recent increase in oxygen needs, now on 5 to 6 L nasal cannula at baseline but today at 9 L nasal cannula.  Procalcitonin negative.  Nonspecific elevation of CRP.  Bronchodilators, I-S/flutter valve.  Discontinue antibiotics.  Leukocytosis secondary to steroid use -Continue Solu-Medrol  and Lasix    Acute on chronic HFpEF Elevated BNP.  On Entresto .  Responding well to IV Lasix     Depression/EOL Recently lost his dog about a month ago since then has been grieving and poor  coping skills. Started on trazodone  recently Mirtazapine at bedtime   Seizure disorder Continue Keppra    BPH Continue Proscar    Early memory loss/Alzheimer's dementia Aricept  on hold due to low blood pressure   Protein calorie malnutrition Encourage p.o. nutrition   HLD Continue statin  No escalation of care.  Plans to transition him home with hospice. Discontinue daily blood work    DVT prophylaxis: Lovenox     Code Status: Limited: Do not attempt resuscitation (DNR) -DNR-LIMITED -Do Not Intubate/DNI  Family Communication: Spouse at bedside Status is: Inpatient Hopefully transition home with hospice in next 24-48 hours Working on home hospice arrangements   Subjective: Met with patient, spouse and palliative care team at bedside. Patient has no new complaints, remains on 7 L nasal cannula Examination:  General exam: Appears calm and comfortable, 7 L nasal cannula, cachectic frail Respiratory system: Diminished breath sounds bilaterally Cardiovascular system: S1 & S2 heard, RRR. No JVD, murmurs, rubs, gallops or clicks. No pedal edema. Gastrointestinal system: Abdomen is nondistended, soft and nontender. No organomegaly or masses felt. Normal bowel sounds heard. Central nervous system: Alert and oriented. No focal neurological deficits. Extremities: Symmetric 5 x 5 power. Skin: No rashes, lesions or ulcers Psychiatry: Judgement and insight appear normal. Mood & affect appropriate.                Diet Orders (From admission, onward)     Start     Ordered   01/31/24 1502  Diet regular Room service appropriate? Yes with Assist; Fluid consistency: Thin  Diet effective now       Question Answer Comment  Room service appropriate? Yes with Assist   Fluid consistency: Thin  01/31/24 1501            Objective: Vitals:   02/02/24 0012 02/02/24 0437 02/02/24 0733 02/02/24 0734  BP: 115/65 114/68  121/68  Pulse: 88 81  81  Resp: 17 18  19   Temp:  98.3 F (36.8 C) 98 F (36.7 C)  97.9 F (36.6 C)  TempSrc: Oral Oral  Axillary  SpO2: 95% 96% 97% 97%  Weight:  68 kg    Height:        Intake/Output Summary (Last 24 hours) at 02/02/2024 1145 Last data filed at 02/02/2024 0915 Gross per 24 hour  Intake 1040 ml  Output 2250 ml  Net -1210 ml   Filed Weights   01/31/24 0427 02/01/24 0426 02/02/24 0437  Weight: 68.2 kg 69.1 kg 68 kg    Scheduled Meds:  feeding supplement  237 mL Oral BID BM   finasteride   5 mg Oral QHS   furosemide   40 mg Intravenous BID   ipratropium-albuterol   3 mL Nebulization TID   levETIRAcetam   250 mg Oral q morning   And   levETIRAcetam   500 mg Oral QHS   methylPREDNISolone  (SOLU-MEDROL ) injection  40 mg Intravenous Q8H   mirtazapine  7.5 mg Oral QHS   pantoprazole   40 mg Oral q morning   polyethylene glycol  17 g Oral Daily   sodium chloride  flush  3 mL Intravenous Q12H   traZODone   100 mg Oral QHS   umeclidinium-vilanterol  1 puff Inhalation Daily   Continuous Infusions:  Nutritional status Signs/Symptoms: energy intake < or equal to 75% for > or equal to 1 month, percent weight loss (Has lost 20 lbs, 12% weight loss in 6 months.) Percent weight loss: 12 % Interventions: Refer to RD note for recommendations Body mass index is 24.2 kg/m.  Data Reviewed:   CBC: Recent Labs  Lab 01/30/24 1305 01/31/24 0217 02/01/24 0232 02/02/24 0238  WBC 11.3* 8.8 7.1 19.9*  NEUTROABS 7.2 6.1  --   --   HGB 10.0* 9.0* 9.8* 9.3*  HCT 31.4* 28.3* 31.4* 29.2*  MCV 82.2 82.0 81.6 81.8  PLT 397 299 344 376   Basic Metabolic Panel: Recent Labs  Lab 01/30/24 1305 01/31/24 0217 02/01/24 0232 02/02/24 0238  NA 135 137 135 137  K 4.2 3.9 3.6 3.5  CL 101 104 101 99  CO2 24 25 28 28   GLUCOSE 113* 99 183* 168*  BUN 19 14 15  24*  CREATININE 1.37* 1.03 0.95 0.95  CALCIUM  8.4* 7.8* 7.7* 7.8*  MG  --  2.1 2.2 2.2   GFR: Estimated Creatinine Clearance: 53.2 mL/min (by C-G formula based on SCr of 0.95  mg/dL). Liver Function Tests: Recent Labs  Lab 01/30/24 1305 01/31/24 0217  AST 39 32  ALT 40 32  ALKPHOS 53 51  BILITOT 1.0 0.9  PROT 6.1* 5.0*  ALBUMIN  2.4* 2.0*   No results for input(s): LIPASE, AMYLASE in the last 168 hours. No results for input(s): AMMONIA in the last 168 hours. Coagulation Profile: Recent Labs  Lab 01/30/24 1305  INR 1.1   Cardiac Enzymes: No results for input(s): CKTOTAL, CKMB, CKMBINDEX, TROPONINI in the last 168 hours. BNP (last 3 results) No results for input(s): PROBNP in the last 8760 hours. HbA1C: No results for input(s): HGBA1C in the last 72 hours. CBG: No results for input(s): GLUCAP in the last 168 hours. Lipid Profile: No results for input(s): CHOL, HDL, LDLCALC, TRIG, CHOLHDL, LDLDIRECT in the last 72 hours. Thyroid  Function  Tests: Recent Labs    01/30/24 1305 01/30/24 1625  TSH  --  1.290  FREET4 1.21*  --    Anemia Panel: No results for input(s): VITAMINB12, FOLATE, FERRITIN, TIBC, IRON, RETICCTPCT in the last 72 hours. Sepsis Labs: Recent Labs  Lab 01/30/24 1314 01/30/24 1540 01/30/24 1548  PROCALCITON  --  0.10  --   LATICACIDVEN 1.1  --  0.9    Recent Results (from the past 240 hours)  MRSA Next Gen by PCR, Nasal     Status: None   Collection Time: 01/23/24  4:18 PM   Specimen: Nasal Mucosa; Nasal Swab  Result Value Ref Range Status   MRSA by PCR Next Gen NOT DETECTED NOT DETECTED Final    Comment: (NOTE) The GeneXpert MRSA Assay (FDA approved for NASAL specimens only), is one component of a comprehensive MRSA colonization surveillance program. It is not intended to diagnose MRSA infection nor to guide or monitor treatment for MRSA infections. Test performance is not FDA approved in patients less than 68 years old. Performed at [facility], [address] [place], [place] [address]   Culture, blood (Routine x 2)     Status: None (Preliminary result)    Collection Time: 01/30/24  1:02 PM   Specimen: BLOOD RIGHT HAND  Result Value Ref Range Status   Specimen Description BLOOD RIGHT HAND  Final   Special Requests   Final    BOTTLES DRAWN AEROBIC AND ANAEROBIC Blood Culture results may not be optimal due to an inadequate volume of blood received in culture bottles   Culture   Final    NO GROWTH 3 DAYS Performed at [facility], [address] [place], [place] [address]    Report Status PENDING  Incomplete  Culture, blood (Routine x 2)     Status: None (Preliminary result)   Collection Time: 01/30/24  1:07 PM   Specimen: BLOOD  Result Value Ref Range Status   Specimen Description BLOOD LEFT ANTECUBITAL  Final   Special Requests   Final    BOTTLES DRAWN AEROBIC AND ANAEROBIC Blood Culture adequate volume   Culture   Final    NO GROWTH 3 DAYS Performed at [facility], [address] [place], [place] [address]    Report Status PENDING  Incomplete  Resp panel by RT-PCR (RSV, Flu A&B, Covid) Anterior Nasal Swab     Status: None   Collection Time: 01/30/24  1:49 PM   Specimen: Anterior Nasal Swab  Result Value Ref Range Status   SARS Coronavirus 2 by RT PCR NEGATIVE NEGATIVE Final   Influenza A by PCR NEGATIVE NEGATIVE Final   Influenza B by PCR NEGATIVE NEGATIVE Final    Comment: (NOTE) The Xpert Xpress SARS-CoV-2/FLU/RSV plus assay is intended as an aid in the diagnosis of influenza from Nasopharyngeal swab specimens and should not be used as a sole basis for treatment. Nasal washings and aspirates are unacceptable for Xpert Xpress SARS-CoV-2/FLU/RSV testing.  Fact Sheet for Patients: BloggerCourse.com  Fact Sheet for Healthcare Providers: SeriousBroker.it  This test is not yet approved or cleared by the [place]  FDA and has been authorized for detection and/or diagnosis of SARS-CoV-2 by FDA under an Emergency Use Authorization (EUA). This EUA will remain in  effect (meaning this test can be used) for the duration of the COVID-19 declaration under Section 564(b)(1) of the Act, 21 U.S.C. section 360bbb-3(b)(1), unless the authorization is terminated or revoked.     Resp  Syncytial Virus by PCR NEGATIVE NEGATIVE Final    Comment: (NOTE) Fact Sheet for Patients: BloggerCourse.com  Fact Sheet for Healthcare Providers: SeriousBroker.it  This test is not yet approved or cleared by the [place]  FDA and has been authorized for detection and/or diagnosis of SARS-CoV-2 by FDA under an Emergency Use Authorization (EUA). This EUA will remain in effect (meaning this test can be used) for the duration of the COVID-19 declaration under Section 564(b)(1) of the Act, 21 U.S.C. section 360bbb-3(b)(1), unless the authorization is terminated or revoked.  Performed at [facility], [address] [place], [place] [address]          Radiology Studies: DG Chest Port 1 View Result Date: 01/31/2024 CLINICAL DATA:  [id number] Dyspnea [id number] EXAM: PORTABLE CHEST - 1 VIEW COMPARISON:  January 30, 2024 FINDINGS: Unchanged asymmetric right apical pleural thickening. Emphysema. Scattered reticular opacities in both lung bases, more so on the left than the right, likely due to underlying emphysema. No lobar consolidation or pneumothorax. Blunting of both costophrenic sulci, unchanged, likely due to pleural thickening. Mild cardiomegaly. Tortuous aorta with aortic atherosclerosis. No acute fracture or destructive lesions. Multilevel thoracic osteophytosis. IMPRESSION: Redemonstrated advanced emphysema. No lobar pneumonia or pulmonary edema. Electronically Signed   By: Rance Burrows M.D.   On: 01/31/2024 12:32           LOS: 3 days   Time spent= 35 mins    Maggie Schooner, MD [facility]  If 7PM-7AM, please contact night-coverage  02/02/2024, 11:45 AM

## 2024-02-02 NOTE — Evaluation (Signed)
 Occupational Therapy Evaluation Patient Details Name: Frank Meyer MRN: [id number] DOB: 1941-03-22 Today's Date: 02/02/2024   History of Present Illness   Frank Meyer is a 83 y.o. male admitted 01/30/24 for syncopal episode, AKI, dehydration, and sepsis from possible aspiration. PMHx: dyslipidemia, BPH, seizure disorder, COPD on chronic 5L O2, pulmonary hypertension, HFpEF, CKD 3a. Of note, recent hospitalization 6/8-6/11 for hypotension and moderate protein calorie malnutrition.     Clinical Impressions Pt admitted based on above, and was seen based on problem list below. PTA pt was living with his wife and was mod I with ADLs and IADLs. Today pt is at his functional baseline of mod I with ADLs, and supervision for functional transfers. Pt with good recall from previous admission of compensatory strategies for ADLs and can return demo. Pt with good safety awareness and has adequate support available at home for d/c if needed. No follow up OT or DME needs. No further acute OT needs, OT is signing off on this pt.        If plan is discharge home, recommend the following:   A little help with walking and/or transfers;A little help with bathing/dressing/bathroom;Assist for transportation     Functional Status Assessment   Patient has not had a recent decline in their functional status     Equipment Recommendations   None recommended by OT     Recommendations for Other Services         Precautions/Restrictions   Precautions Precautions: Fall Recall of Precautions/Restrictions: Intact Precaution/Restrictions Comments: watch SpO2 Restrictions Weight Bearing Restrictions Per Provider Order: No     Mobility Bed Mobility Overal bed mobility: Modified Independent             General bed mobility comments: Increased time    Transfers Overall transfer level: Modified independent Equipment used: None Transfers: Sit to/from Stand, Bed to  chair/wheelchair/BSC Sit to Stand: Supervision     Step pivot transfers: Supervision     General transfer comment: No assist      Balance Overall balance assessment: Mild deficits observed, not formally tested     ADL either performed or assessed with clinical judgement   ADL Overall ADL's : At baseline;Modified independent       General ADL Comments: Pt at baseline of mod I for ADLs, good recall of compensatory strategies for ADLs such as figure 4 for LB dressing and use of urinal for PM toileting     Vision Baseline Vision/History: 0 No visual deficits Vision Assessment?: No apparent visual deficits            Pertinent Vitals/Pain Pain Assessment Pain Assessment: No/denies pain     Extremity/Trunk Assessment Upper Extremity Assessment Upper Extremity Assessment: Generalized weakness   Lower Extremity Assessment Lower Extremity Assessment: Defer to PT evaluation   Cervical / Trunk Assessment Cervical / Trunk Assessment: Normal   Communication Communication Communication: No apparent difficulties   Cognition Arousal: Alert Behavior During Therapy: WFL for tasks assessed/performed Cognition: No apparent impairments   Following commands: Intact       Cueing  General Comments   Cueing Techniques: Verbal cues  Orthostatic vitals recorded, see progress note for further details           Home Living Family/patient expects to be discharged to:: Private residence Living Arrangements: Spouse/significant other Available Help at Discharge: Family;Available 24 hours/day Type of Home: House Home Access: Stairs to enter [employer] of Steps: 1   Home Layout: Two level;Able to live on  main level with bedroom/bathroom     Bathroom Shower/Tub: Chief Strategy Officer: Handicapped height     Home Equipment: None   Additional Comments: 2.5L O2 at night 5-6 during the day, increases to 8L for showers      Prior  Functioning/Environment Prior Level of Function : Independent/Modified Independent;Driving       Mobility Comments: no AD ADLs Comments: ind    OT Problem List: Cardiopulmonary status limiting activity;Decreased activity tolerance   OT Treatment/Interventions: Self-care/ADL training;Therapeutic exercise;Energy conservation;DME and/or AE instruction;Therapeutic activities;Patient/family education;Balance training      OT Goals(Current goals can be found in the care plan section)   Acute Rehab OT Goals Patient Stated Goal: To go home OT Goal Formulation: With patient Time For Goal Achievement: 02/16/24 Potential to Achieve Goals: Good   AM-PAC OT 6 Clicks Daily Activity     Outcome Measure Help from another person eating meals?: None Help from another person taking care of personal grooming?: A Little Help from another person toileting, which includes using toliet, bedpan, or urinal?: A Little Help from another person bathing (including washing, rinsing, drying)?: A Little Help from another person to put on and taking off regular upper body clothing?: A Little Help from another person to put on and taking off regular lower body clothing?: A Little 6 Click Score: 19   End of Session Equipment Utilized During Treatment: Oxygen Nurse Communication: Mobility status  Activity Tolerance: Patient tolerated treatment well Patient left: in bed;with call bell/phone within reach  OT Visit Diagnosis: Muscle weakness (generalized) (M62.81)                Time: 4782-9562 OT Time Calculation (min): 14 min Charges:  OT General Charges $OT Visit: 1 Visit OT Evaluation $OT Eval Low Complexity: 1 Low  Delmer Ferraris, OT  Acute Rehabilitation Services Office [phone] Secure chat preferred  Mickael Alamo 02/02/2024, 4:25 PM

## 2024-02-02 NOTE — Plan of Care (Signed)
   Problem: Clinical Measurements: Goal: Ability to maintain clinical measurements within normal limits will improve Outcome: Progressing Goal: Diagnostic test results will improve Outcome: Progressing   Problem: Safety: Goal: Ability to remain free from injury will improve Outcome: Progressing

## 2024-02-02 NOTE — Evaluation (Signed)
 Physical Therapy Brief Evaluation and Discharge Note Patient Details Name: Frank Meyer MRN: [id number] DOB: 09/21/1940 Today's Date: 02/02/2024   History of Present Illness  Frank Meyer is a 83 y.o. male admitted 01/30/24 for syncopal episode, AKI, dehydration, and sepsis from possible aspiration. PMHx: dyslipidemia, BPH, seizure disorder, COPD on chronic 5L O2, pulmonary hypertension, HFpEF, CKD 3a. Of note, recent hospitalization 6/8-6/11 for hypotension and moderate protein calorie malnutrition.   Clinical Impression  Pt greeted seated in recliner chair, pleasant and agreeable to PT evaluation. PTA, pt was independent with functional mobility and ADLs. He lives with his wife in a two story house with a level entry and reports being able to reside on the main level. Pt monitors his SpO2 and will adjust the rate (L/min) appropriately depending on the task he is performing. He reports wearing 5L at rest, 6-7L with short-distance ambulation within the house, 8L for bathing, 10-12L during long distance ambulation such as walking to the mailbox or into the gym, and 15L during seated exercises. He reports a couple of LOB from tripping over O2 tubing, but denies falls. Pt performed OOB mobility with modI. He ambulated ~249ft within the hallway with intermittent standing rest breaks and cues for PLB. Pt appears to be at his baseline function, no further PT needs. Patient feels ready and safe for discharge home. I have answered all his questions related to mobility. Recommend daily bed>chair transfer, sitting up for all meals, and multiple bouts of ambulation while admitted. Will defer to mobility acutely.       PT Assessment Patient does not need any further PT services  Assistance Needed at Discharge  PRN    Equipment Recommendations None recommended by PT  Recommendations for Other Services       Precautions/Restrictions Precautions Precautions: Fall Recall of  Precautions/Restrictions: Intact Precaution/Restrictions Comments: watch SpO2 Restrictions Weight Bearing Restrictions Per Provider Order: No        Mobility  Bed Mobility       General bed mobility comments: Not assessed. Pt greeted seated in recliner chair and returned there at end of session.  Transfers Overall transfer level: Modified independent Equipment used: None Transfers: Sit to/from Stand             General transfer comment: Pt stood from recliner chair by pushing up with BUE support from armrests. He took increased time to scoot fwd and power up into standing. Good eccentric control with sitting.    Ambulation/Gait Ambulation/Gait assistance: Modified independent (Device/Increase time) Gait Distance (Feet): 250 Feet Assistive device: None Gait Pattern/deviations: Step-through pattern, Decreased stride length, Trunk flexed Gait Speed: Pace WFL General Gait Details: Pt ambulated with a short and stedy gait pattern without AD. He manuevered within the room and hallway well, navigating obstacles without LOB. Pt had good safety awareness and took intermittent standing rest breaks. Cues for PLB technique. He desaturated to 83% SpO2 while ambulating and talking on 8L bumped up to 10L and he recovered within ~30sec. Pt reports he easily adjusts his O2 at home based on the distance he is traveling.  Home Activity Instructions    Stairs            Modified [name] (Stroke Patients Only)        Balance Overall balance assessment: Mild deficits observed, not formally tested                        Pertinent Vitals/Pain PT - Brief Vital  Signs All Vital Signs Stable: Other (comment) (Pt on 6-7L at rest and 8-10L during gait with SpO2 83-90% with activity and talking. Cues for PLB technique) Pain Assessment Pain Assessment: No/denies pain     Home Living Family/patient expects to be discharged to:: Private residence Living Arrangements:  Spouse/significant other Available Help at Discharge: Family;Available 24 hours/day Home Environment: Level entry   Home Equipment: None        Prior Function Level of Independence: Independent Comments: Ambulates without AD. Pt reports 2 falls in the last 71mo at night when he got tangled in the O2 tubing. He has 50' O2 tubing on condenser and easily mobilizes around his house. He was previously going to the gym 2x/wk to use the recumbent bike and UBE machines on 12-15L O2 to maintain >88% during activity. He monitors his SpO2 at home. Wife manages the IADLs.    UE/LE Assessment   UE ROM/Strength/Tone/Coordination: WFL    LE ROM/Strength/Tone/Coordination: [facility]      Communication   Communication Communication: No apparent difficulties     Cognition Overall Cognitive Status: Appears within functional limits for tasks assessed/performed       General Comments      Exercises     Assessment/Plan    PT Problem List         PT Visit Diagnosis Other abnormalities of gait and mobility (R26.89)    No Skilled PT All education completed;Patient is modified independent with all activity/mobility;Patient will have necessary level of assist by caregiver at discharge   Co-evaluation                AMPAC 6 Clicks Help needed turning from your back to your side while in a flat bed without using bedrails?: None Help needed moving from lying on your back to sitting on the side of a flat bed without using bedrails?: None Help needed moving to and from a bed to a chair (including a wheelchair)?: A Little Help needed standing up from a chair using your arms (e.g., wheelchair or bedside chair)?: A Little Help needed to walk in hospital room?: A Little Help needed climbing 3-5 steps with a railing? : A Little 6 Click Score: 20      End of Session Equipment Utilized During Treatment: Gait belt;Oxygen Activity Tolerance: Patient tolerated treatment well Patient left: in  chair;with call bell/phone within reach Nurse Communication: Mobility status PT Visit Diagnosis: Other abnormalities of gait and mobility (R26.89)     Time: 0981-1914 PT Time Calculation (min) (ACUTE ONLY): 24 min  Charges:   PT Evaluation $PT Eval Low Complexity: 1 Low PT Treatments $Gait Training: 8-22 mins    Glenford Lanes, PT, DPT Acute Rehabilitation Services Office: [phone] Secure Chat Preferred  Riva Chester  02/02/2024, 2:48 PM

## 2024-02-02 NOTE — Progress Notes (Signed)
   02/02/24 1600  Orthostatic Lying   BP- Lying 119/75  Pulse- Lying 86  Orthostatic Sitting  BP- Sitting 110/82  Pulse- Sitting 94  Orthostatic Standing at 0 minutes  BP- Standing at 0 minutes 95/65  Pulse- Standing at 0 minutes 97   Orthostatic vitals recorded during OT eval. Pt asymptomatic throughout session. Noted mild BP drop. See full OT eval for further details.  Carmichael Burdette C, OT  Acute Rehabilitation Services Office [phone] Secure chat preferred

## 2024-02-02 NOTE — Progress Notes (Signed)
 Daily Progress Note   Patient Name: Frank Meyer       Date: 02/02/2024 DOB: Jul 13, 1941  Age: 83 y.o. MRN#: [id number] Attending Physician: Maggie Schooner, MD Primary Care Physician: Suan Elm, MD Admit Date: 01/30/2024  Reason for Consultation/Follow-up: Establishing goals of care and  symptom management  Subjective: 83 y.o. 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    Patient has had issues with recurrent hospitalizations Was just hospitalized earlier this month from 01/23/2024 through 01/26/2024 for septic shock due to pneumonia, severe COPD, and acute on chronic respiratory failure with hypoxia.  Now readmitted after having several falls at home.  Initially noted to be hypotensive and with increased O2 requirements.  Today, patient states he is feeling ok. O2 requirements have been able to be decreased and he is satting > 90% on 5 L at rest. O2 continues to drop with exertion. He is very interested in getting out of bed and trying to get around today. His wife is at bedside and states that she has met with hospice and feels comfortable with their services. She states they ([facility]) plan to come by later today to deliver a second concentrator for their bedroom which is needed prior to discharge. Patient denies any pain or SOB at rest.   Chart review/care coordination:  Completed extensive chart review including EPIC notes, labs (independently reviewed), vital signs, I&O, and collaborated with nursing and Dr. Ariel Begun.    Length of Stay: 3   Physical  Exam Pulmonary:     Effort: Pulmonary effort is normal. No respiratory distress.   Skin:    General: Skin is warm and dry.     Comments: Cyanosis noted at finger tips as well as some clubbing   Neurological:     Mental Status: He is alert.   Psychiatric:        Mood and Affect: Mood normal.        Behavior: Behavior normal.             Vital Signs: BP 109/63 (BP Location: Right Arm)   Pulse 91   Temp 98 F (36.7 C) (Oral)   Resp 17   Ht 5' 6 (1.676 m)   Wt 68 kg   SpO2 94%   BMI 24.20 kg/m  SpO2: SpO2: 94 % O2 Device: O2 Device: High Flow Nasal Cannula O2 Flow Rate: O2 Flow  Rate (L/min): 8 L/min      Palliative Assessment/Data: 50%   Palliative Care Assessment & Plan   Patient Profile/Assessment:  83 year old male with end stage COPD and recurrent hospitalizations. Seen by PMT for initial consultation on 01/31/2024 and after extensive GOC discussion it was decided to attempt to optimize respiratory status, decrease O2 needs with steriods/lasix , and d/c home with hospice services. He no longer wishes to return to the hospital and wants to be kept comfortable.   Today, he appears a bit more apprehensive about going home. We discussed at length again the hospice philosophy and the goal of improving QOL/comfort. We discussed discontinuing non-comfort meds and initiating/adjusting current comfort meds. He is nervous about stopping some of his medication ie ASA/statin. He states so we are just going to give up on me. I explained at length about the time to benefit with these medications and risk for toxicities/side effects as individuals  near EOL. Assured him we are not giving up on him just shifting our focus. He then expresses understanding and agrees it would be best to make these changes (see med list for meds d/c'ed each reviewed with pt and agreed upon prior to d/cing). Then discussed comfort meds again which he seemed nervous about but with reassurance that the doses were  low and only to be used if he needed it he indicated that he would want those meds to be on hand if he ever got uncomfortable. I extensively discussed how they can help him manage at home and explained hospice services/roles/medication review/payment etc. Both wife and pt verbalized understanding. Through shared decision making we discussed PT eval to get him up and see how he would do walking. Continue steriods/lasix  in hopes of optimizing respiratory status and potentially d/c on 6/19. Admitting physician also present and assisted with forming plan pt/wife were comfortable with.   At the end of the conversation both he and his wife expressed comfort with the plan as listed above. All questions were answered.   Recommendations/Plan: PT eval to mobilize and determine safety walking Will switch IV morphine and Ativan to PO in anticipation of d/c home Non-comfort meds d/c'ed (those pt agreed upon, some he preferred to maintain ie seizure meds) Hospice to follow up and ensure delivery of necessary equipment in the home Comfort care only D/c labs Consider long term Prednisone  5 mg/day PO for end stage COPD maintenance PMT to continue to follow and provide support as needed   Symptom management:  D/c morphine IV D/c Ativan IV Start morphine 5 mg q 2 hrs PRN Start Ativan 0.5 mg q 4 hrs PRN Continue Zofran  PRN Duonebs PRN Trazadone/Remeron for sleep  Prognosis:  < 6 months    Discharge Planning: Home with Hospice    Detailed review of medical records (labs, imaging, vital signs), medically appropriate exam, discussed with treatment team, counseling and education to patient, family, & staff, documenting clinical information, medication management, coordination of care   Total time: I spent 75 minutes in the care of the patient today in the above activities and documenting the encounter.          Render Carrie, NP  Palliative Medicine Team Team phone # [phone]  Thank  you for allowing the Palliative Medicine Team to assist in the care of this patient. Please utilize secure chat with additional questions, if there is no response within 30 minutes please call the above phone number.  Palliative Medicine Team providers are available by phone  from 7am to 7pm daily and can be reached through the team cell phone.  Should this patient require assistance outside of these hours, please call the patient's attending physician.

## 2024-02-03 ENCOUNTER — Other Ambulatory Visit (HOSPITAL_COMMUNITY): Payer: Self-pay

## 2024-02-03 DIAGNOSIS — E861 Hypovolemia: Secondary | ICD-10-CM

## 2024-02-03 MED ORDER — LORAZEPAM 2 MG/ML PO CONC: 0.5000 mg | ORAL | Status: DC | PRN

## 2024-02-03 MED ORDER — FUROSEMIDE 40 MG PO TABS
40.0000 mg | ORAL_TABLET | Freq: Every day | ORAL | 0 refills | Status: DC | PRN
  Filled 2024-02-03: qty 30, 30d supply, fill #0

## 2024-02-03 MED ORDER — PREDNISONE 10 MG PO TABS
ORAL_TABLET | ORAL | 0 refills | Status: AC
  Filled 2024-02-03: qty 40, 16d supply, fill #0

## 2024-02-03 MED ORDER — MORPHINE SULFATE (CONCENTRATE) 10 MG /0.5 ML PO SOLN: 5.0000 mg | ORAL | Status: DC | PRN

## 2024-02-03 NOTE — Care Management Important Message (Signed)
 Important Message  Patient Details  Name: Frank Meyer MRN: [id number] Date of Birth: 1941/08/02   Important Message Given:  Yes - Medicare IM     [name] 02/03/2024, 10:23 AM

## 2024-02-03 NOTE — Progress Notes (Signed)
 Patient declines [place] vitals. States he spoke with palliative today that he did not want to be disturbed during the night with vital signs.

## 2024-02-03 NOTE — Discharge Summary (Signed)
 Physician Discharge Summary  Frank Meyer:[id number] DOB: 1941/06/08 DOA: 01/30/2024  PCP: Suan Elm, MD  Admit date: 01/30/2024 Discharge date: 02/03/2024  Admitted From: Home Disposition: Home with hospice  Recommendations for Outpatient Follow-up:  Follow up with PCP in 1-2 weeks Please obtain BMP/CBC in one week your next doctors visit.  2 weeks of prednisone  taper given.  May need to be on lifelong but this can be determined depending on how his does progress during the taper Continue using bronchodilators Discontinue aspirin , statin, Entresto , thiamine , folic acid, vitamin D , Aricept    Discharge Condition: Stable CODE STATUS: DNR  Diet recommendation: Comfort feeding  Brief/Interim Summary: Brief Narrative:   83 y.o. 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  During recent hospitalization treated for mechanical fall, septic shock treated with antibiotics.  Now admitted for another syncopal episode, AKI, dehydration, sepsis from possible aspiration.  During the position patient was diuresed, started on aggressive bronchodilators and steroids.  Slowly his oxygen needs stabilized around 7-9 L nasal cannula.  Given extent of his pulmonary condition, an extensive discussion with hospice/palliative care team patient was being transitioned to comfort care.  Will discharge him today in stable condition.   Assessment & Plan:  Principal Problem:   Hypotension Active Problems:   Pulmonary hypertension (HCC)   Seizures (HCC)   Dyslipidemia   Barrett's esophagus   Chronic respiratory failure with hypoxia and hypercapnia (HCC)   Hyperlipidemia   Chronic diastolic CHF (congestive heart failure) (HCC)   BPH (benign prostatic  hyperplasia)   GERD (gastroesophageal reflux disease)   Mild cognitive impairment of uncertain or unknown etiology   Chronic obstructive pulmonary disease (HCC)   Malnutrition of moderate degree   Sepsis (HCC)    Severe dehydration with associated hypotension Mild AKI on CKD 3a Baseline creatinine 0.8, admission creatinine 1.37, now back to baseline with diuretics   Acute on chronic hypoxic respiratory failure Acute COPD exacerbation Pulmonary hypertension Abnormal chest x-ray Recent increase in oxygen needs, now on 5 to 6 L nasal cannula at baseline but today at 9 L nasal cannula.  Procalcitonin negative.  Nonspecific elevation of CRP.  Bronchodilators, I-S/flutter valve.  Discontinue antibiotics.  Leukocytosis secondary to steroid use -Continue Solu-Medrol  and Lasix , will transition to p.o. prednisone  for 2 more weeks/slow taper and p.o. Lasix  as needed as he appears euvolemic today.  May need to be on lifelong prednisone  but will defer this to outpatient provider depending on his improvement   Acute on chronic HFpEF Euvolemic.  Transition to Lasix  p.o. as needed    Depression/EOL Will be discharged home.   Seizure disorder Continue Keppra    BPH Continue Proscar    Early memory loss/Alzheimer's dementia Aricept  on hold due to low blood pressure   Protein calorie malnutrition Encourage p.o. nutrition   HLD Continue statin  No escalation of care.  Plans to transition him home with hospice. Discontinue daily blood work Minimize medications that are not really for comfort    DVT prophylaxis: Lovenox     Code Status: Limited: Do not attempt resuscitation (DNR) -DNR-LIMITED -Do Not Intubate/DNI  Family Communication: Family updated Status is: Inpatient DC today   Subjective: Patient feels well no complaints.  Daughter at bedside and spoke with his wife over the phone.  No complaints.  Wishing to go home. Examination:  General exam: Appears calm and comfortable, 7 L  nasal cannula, cachectic frail Respiratory system: Diminished breath sounds bilaterally Cardiovascular system: S1 & S2 heard, RRR. No JVD, murmurs, rubs, gallops or clicks. No pedal edema. Gastrointestinal system: Abdomen is nondistended, soft and nontender. No organomegaly or masses felt. Normal bowel sounds heard. Central nervous system: Alert and oriented. No focal neurological deficits. Extremities: Symmetric 5 x 5 power. Skin: No rashes, lesions or ulcers Psychiatry: Judgement and insight appear normal. Mood & affect appropriate.    Discharge Diagnoses:  Principal Problem:   Hypotension Active Problems:   Pulmonary hypertension (HCC)   Seizures (HCC)   Dyslipidemia   Barrett's esophagus   Chronic respiratory failure with hypoxia and hypercapnia (HCC)   Hyperlipidemia   Chronic diastolic CHF (congestive heart failure) (HCC)   BPH (benign prostatic hyperplasia)   GERD (gastroesophageal reflux disease)   Mild cognitive impairment of uncertain or unknown etiology   Chronic obstructive pulmonary disease (HCC)   Malnutrition of moderate degree   Sepsis (HCC)   Protein-calorie malnutrition, severe      Discharge Exam: Vitals:   02/03/24 0720 02/03/24 0739  BP: (!) 122/102   Pulse: 67   Resp: 16   Temp: 98.4 F (36.9 C)   SpO2: 96% 93%   Vitals:   02/02/24 1927 02/03/24 0519 02/03/24 0720 02/03/24 0739  BP: 115/70 127/75 (!) 122/102   Pulse: 86 81 67   Resp: 18 (!) 25 16   Temp: 98 F (36.7 C) 98.7 F (37.1 C) 98.4 F (36.9 C)   TempSrc: Oral Oral Oral   SpO2: 93% 94% 96% 93%  Weight:  68 kg    Height:          Discharge Instructions   Allergies as of 02/03/2024   No Known Allergies      Medication List     PAUSE taking these medications    furosemide  40 MG tablet Wait to take this until your doctor or other care provider tells you to start again. Commonly known as: LASIX  Take 1 tablet (40 mg total) by mouth daily as needed for fluid or  edema. What changed:  when to take this reasons to take this       STOP taking these medications    amoxicillin -clavulanate 875-125 MG tablet Commonly known as: AUGMENTIN    aspirin  EC 81 MG tablet   atorvastatin  20 MG tablet Commonly known as: LIPITOR   cetirizine 10 MG tablet Commonly known as: ZYRTEC   cholecalciferol  1000 units tablet Commonly known as: VITAMIN D    donepezil  10 MG tablet Commonly known as: ARICEPT    Entresto  24-26 MG Generic drug: sacubitril -valsartan    ferrous sulfate 325 (65 FE) MG EC tablet   thiamine  100 MG tablet Commonly known as: VITAMIN B1       TAKE these medications    albuterol  108 (90 Base) MCG/ACT inhaler Commonly known as: VENTOLIN  HFA Inhale 1-2 puffs into the lungs every 4 (four) hours as needed for wheezing or shortness of breath.   Anoro Ellipta  62.5-25 MCG/ACT Aepb Generic drug: umeclidinium-vilanterol USE 1 INHALATION BY MOUTH ONCE  DAILY AT THE SAME TIME EACH DAY What changed: See the new instructions.   finasteride  5 MG tablet Commonly known as: PROSCAR  Take 5 mg by mouth at bedtime.   ipratropium 0.06 % nasal spray Commonly known as: ATROVENT  Place 1-2 sprays into both nostrils 4 (four) times daily as needed for rhinitis (runny nose). 2 sprays in the right nostril and 1 spray  in the left nostril once daily   ipratropium-albuterol  0.5-2.5 (3) MG/3ML Soln Commonly known as: DUONEB Take 3 mLs by nebulization every 6 (six) hours as needed. What changed: when to take this   levETIRAcetam  250 MG tablet Commonly known as: KEPPRA  TAKE 1 TABLET BY MOUTH IN THE  MORNING AND 2 TABLETS AT NIGHT What changed: See the new instructions.   Melatonin 10 MG Tabs Take 10 mg by mouth at bedtime.   nitroGLYCERIN  0.4 MG SL tablet Commonly known as: NITROSTAT  Place 1 tablet (0.4 mg total) under the tongue every 5 (five) minutes as needed. Chest pain What changed: reasons to take this   OXYGEN Inhale 2-5 L/min into the  lungs continuous. 5 lpm during the day and 2.5 lpm at night   pantoprazole  40 MG tablet Commonly known as: PROTONIX  Take 40 mg by mouth every morning.   polyethylene glycol 17 g packet Commonly known as: MIRALAX  / GLYCOLAX  Take 17 g by mouth daily.   predniSONE  10 MG tablet Commonly known as: DELTASONE  Take 4 tablets (40 mg total) by mouth daily with breakfast for 4 days, THEN 3 tablets (30 mg total) daily with breakfast for 4 days, THEN 2 tablets (20 mg total) daily with breakfast for 4 days, THEN 1 tablet (10 mg total) daily with breakfast for 4 days. Start taking on: February 03, 2024   traZODone  100 MG tablet Commonly known as: DESYREL  Take 1 tablet (100 mg total) by mouth at bedtime.        Follow-up Information     Suan Elm, MD Follow up.   Specialty: Internal Medicine Why: Please follow up in a week. Contact information: [address] [place] [address] [phone]         [facility] Follow up.   Why: home hospice Contact information: [address] [place] [address] [phone]                No Known Allergies  You were cared for by a hospitalist during your hospital stay. If you have any questions about your discharge medications or the care you received while you were in the hospital after you are discharged, you can call the unit and asked to speak with the hospitalist on call if the hospitalist that took care of you is not available. Once you are discharged, your primary care physician will handle any further medical issues. Please note that no refills for any discharge medications will be authorized once you are discharged, as it is imperative that you return to your primary care physician (or establish a relationship with a primary care physician if you do not have one) for your aftercare needs so that they can reassess your need for medications and monitor your lab values.  You were cared for by a hospitalist during  your hospital stay. If you have any questions about your discharge medications or the care you received while you were in the hospital after you are discharged, you can call the unit and asked to speak with the hospitalist on call if the hospitalist that took care of you is not available. Once you are discharged, your primary care physician will handle any further medical issues. Please note that NO REFILLS for any discharge medications will be authorized once you are discharged, as it is imperative that you return to your primary care physician (or establish a relationship with a primary care physician if you do not have one) for your aftercare needs so  that they can reassess your need for medications and monitor your lab values.  Please request your Prim.MD to go over all Hospital Tests and Procedure/Radiological results at the follow up, please get all Hospital records sent to your Prim MD by signing hospital release before you go home.  Get CBC, CMP, 2 view Chest X ray checked  by Primary MD during your next visit or SNF MD in 5-7 days ( we routinely change or add medications that can affect your baseline labs and fluid status, therefore we recommend that you get the mentioned basic workup next visit with your PCP, your PCP may decide not to get them or add new tests based on their clinical decision)  On your next visit with your primary care physician please Get Medicines reviewed and adjusted.  If you experience worsening of your admission symptoms, develop shortness of breath, life threatening emergency, suicidal or homicidal thoughts you must seek medical attention immediately by calling 911 or calling your MD immediately  if symptoms less severe.  You Must read complete instructions/literature along with all the possible adverse reactions/side effects for all the Medicines you take and that have been prescribed to you. Take any new Medicines after you have completely understood and accpet all the  possible adverse reactions/side effects.   Do not drive, operate heavy machinery, perform activities at heights, swimming or participation in water activities or provide baby sitting services if your were admitted for syncope or siezures until you have seen by Primary MD or a Neurologist and advised to do so again.  Do not drive when taking Pain medications.   Procedures/Studies: DG Chest Port 1 View Result Date: 01/31/2024 CLINICAL DATA:  [id number] Dyspnea [id number] EXAM: PORTABLE CHEST - 1 VIEW COMPARISON:  January 30, 2024 FINDINGS: Unchanged asymmetric right apical pleural thickening. Emphysema. Scattered reticular opacities in both lung bases, more so on the left than the right, likely due to underlying emphysema. No lobar consolidation or pneumothorax. Blunting of both costophrenic sulci, unchanged, likely due to pleural thickening. Mild cardiomegaly. Tortuous aorta with aortic atherosclerosis. No acute fracture or destructive lesions. Multilevel thoracic osteophytosis. IMPRESSION: Redemonstrated advanced emphysema. No lobar pneumonia or pulmonary edema. Electronically Signed   By: Rance Burrows M.D.   On: 01/31/2024 12:32   DG Chest Port 1 View Result Date: 01/30/2024 CLINICAL DATA:  Sepsis EXAM: PORTABLE CHEST 1 VIEW COMPARISON:  01/23/2024 FINDINGS: Single frontal view of the chest demonstrates a stable cardiac silhouette. Stable background emphysema and asymmetric right apical pleural thickening. Developing airspace disease at the left lung base obscuring the cardiac silhouette, with trace bilateral pleural effusions noted. No pneumothorax. No acute bony abnormalities. IMPRESSION: 1. Developing left basilar airspace disease compatible with pneumonia. 2. Trace bilateral pleural effusions. 3. Chronic background emphysema and right apical pleural scarring. Electronically Signed   By: Bobbye Burrow M.D.   On: 01/30/2024 13:40   ECHOCARDIOGRAM COMPLETE Result Date: 01/23/2024    ECHOCARDIOGRAM REPORT    Patient Name:   Agatha Alcon Date of Exam: 01/23/2024 Medical Rec #:  [id number]          Height:       66.0 in Accession #:    [id number]         Weight:       161.2 lb Date of Birth:  10-Oct-1940           BSA:          1.824 m Patient Age:    74 years  BP:           86/57 mmHg Patient Gender: M                  HR:           75 bpm. Exam Location:  Inpatient Procedure: 2D Echo, Cardiac Doppler and Color Doppler (Both Spectral and Color            Flow Doppler were utilized during procedure). Indications:    Abnormal ECG R94.31  History:        Patient has prior history of Echocardiogram examinations, most                 recent 09/14/2023. CHF, Pulmonary HTN and COPD,                 Signs/Symptoms:Hypotension; Risk Factors:Dyslipidemia.  Sonographer:    Terrilee Few RCS Referring Phys: Craige Dixon IMPRESSIONS  1. Left ventricular ejection fraction, by estimation, is approximately 55%. There is mild concentric left ventricular hypertrophy. Left ventricular diastolic parameters are indeterminate. There is the interventricular septum is flattened in systole and diastole, consistent with right ventricular pressure and volume overload.  2. Right ventricular systolic function is severely reduced. The right ventricular size is moderately enlarged. Tricuspid regurgitation signal is inadequate for assessing PA pressure. The estimated right ventricular systolic pressure is 30.2 mmHg.  3. A small pericardial effusion is present. The pericardial effusion is posterior to the left ventricle. There is no evidence of cardiac tamponade.  4. The mitral valve is degenerative. Mild mitral valve regurgitation.  5. The aortic valve is tricuspid. There is mild calcification of the aortic valve. Aortic valve regurgitation is mild. Aortic valve sclerosis/calcification is present, without any evidence of aortic stenosis.  6. Aortic dilatation noted. There is mild dilatation of the ascending aorta, measuring 39 mm.   7. The inferior vena cava is normal in size with greater than 50% respiratory variability, suggesting right atrial pressure of 3 mmHg. FINDINGS  Left Ventricle: Left ventricular ejection fraction, by estimation, is 55%. The left ventricle has normal function. The left ventricle has no regional wall motion abnormalities. The left ventricular internal cavity size was normal in size. There is mild concentric left ventricular hypertrophy. The interventricular septum is flattened in systole and diastole, consistent with right ventricular pressure and volume overload. Left ventricular diastolic parameters are indeterminate. Right Ventricle: The right ventricular size is moderately enlarged. No increase in right ventricular wall thickness. Right ventricular systolic function is severely reduced. Tricuspid regurgitation signal is inadequate for assessing PA pressure. The tricuspid regurgitant velocity is 2.61 m/s, and with an assumed right atrial pressure of 3 mmHg, the estimated right ventricular systolic pressure is 30.2 mmHg. Left Atrium: Left atrial size was normal in size. Right Atrium: Right atrial size was normal in size. Pericardium: A small pericardial effusion is present. The pericardial effusion is posterior to the left ventricle. There is no evidence of cardiac tamponade. Mitral Valve: The mitral valve is degenerative in appearance. There is mild thickening of the mitral valve leaflet(s). Mild mitral valve regurgitation, with posteriorly-directed jet. Tricuspid Valve: The tricuspid valve is grossly normal. Tricuspid valve regurgitation is mild. Aortic Valve: The aortic valve is tricuspid. There is mild calcification of the aortic valve. There is mild aortic valve annular calcification. Aortic valve regurgitation is mild. Aortic valve sclerosis/calcification is present, without any evidence of aortic stenosis. Aortic valve peak gradient measures 6.1 mmHg. Pulmonic Valve: The pulmonic valve was grossly normal.  Pulmonic valve regurgitation is trivial. Aorta: Aortic dilatation noted. There is mild dilatation of the ascending aorta, measuring 39 mm. Venous: The inferior vena cava is normal in size with greater than 50% respiratory variability, suggesting right atrial pressure of 3 mmHg. IAS/Shunts: No atrial level shunt detected by color flow Doppler. Additional Comments: 3D was performed not requiring image post processing on an independent workstation and was indeterminate.  LEFT VENTRICLE PLAX 2D LVIDd:         4.20 cm   Diastology LVIDs:         2.90 cm   LV e' medial:    5.44 cm/s LV PW:         1.20 cm   LV E/e' medial:  11.3 LV IVS:        1.10 cm   LV e' lateral:   9.68 cm/s LVOT diam:     2.30 cm   LV E/e' lateral: 6.3 LV SV:         72 LV SV Index:   39 LVOT Area:     4.15 cm  RIGHT VENTRICLE             IVC RV S prime:     11.30 cm/s  IVC diam: 2.10 cm TAPSE (M-mode): 1.9 cm LEFT ATRIUM             Index        RIGHT ATRIUM           Index LA diam:        4.00 cm 2.19 cm/m   RA Area:     19.90 cm LA Vol (A2C):   50.7 ml 27.79 ml/m  RA Volume:   49.40 ml  27.08 ml/m LA Vol (A4C):   51.9 ml 28.45 ml/m LA Biplane Vol: 52.2 ml 28.61 ml/m  AORTIC VALVE AV Area (Vmax): 2.97 cm AV Vmax:        123.00 cm/s AV Peak Grad:   6.1 mmHg LVOT Vmax:      87.87 cm/s LVOT Vmean:     58.333 cm/s LVOT VTI:       0.172 m  AORTA Ao Root diam: 3.70 cm Ao Asc diam:  3.90 cm MITRAL VALVE               TRICUSPID VALVE MV Area (PHT): 3.21 cm    TR Peak grad:   27.2 mmHg MV Decel Time: 236 msec    TR Vmax:        261.00 cm/s MV E velocity: 61.30 cm/s MV A velocity: 58.20 cm/s  SHUNTS MV E/A ratio:  1.05        Systemic VTI:  0.17 m                            Systemic Diam: 2.30 cm Teddie Favre MD Electronically signed by Teddie Favre MD Signature Date/Time: 01/23/2024/7:31:28 PM    Final    CT Head Wo Contrast Result Date: 01/23/2024 CLINICAL DATA:  Trip and fall last night EXAM: CT HEAD WITHOUT CONTRAST TECHNIQUE: Contiguous  axial images were obtained from the base of the skull through the vertex without intravenous contrast. RADIATION DOSE REDUCTION: This exam was performed according to the departmental dose-optimization program which includes automated exposure control, adjustment of the mA and/or kV according to patient size and/or use of iterative reconstruction technique. COMPARISON:  11/06/2021 FINDINGS: Brain: No evidence of acute infarction, hemorrhage, hydrocephalus, extra-axial collection or  mass lesion/mass effect. Vascular: No hyperdense vessel or unexpected calcification. Skull: Normal. Negative for fracture or focal lesion. Sinuses/Orbits: No acute finding. Other: None. IMPRESSION: No acute intracranial pathology. Electronically Signed   By: Fredricka Jenny M.D.   On: 01/23/2024 13:24   DG Chest Portable 1 View Result Date: 01/23/2024 CLINICAL DATA:  Marvell Slider.  Shortness of breath. EXAM: PORTABLE CHEST 1 VIEW COMPARISON:  11/12/2023 FINDINGS: The heart is mildly enlarged but appears stable. The mediastinal and hilar contours are stable. 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 Persistent dense right apical pleural and parenchymal disease. No definite acute overlying pulmonary process. The bony thorax is intact. IMPRESSION: 1. 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 2. Persistent dense right apical pleural and parenchymal disease. 3. No definite acute overlying pulmonary process. Electronically Signed   By: Marrian Siva M.D.   On: 01/23/2024 11:32   Intravitreal Injection, Pharmacologic Agent - OS - Left Eye Result Date: 01/04/2024 Time Out 01/04/2024. 11:28 AM. Confirmed correct patient, procedure, site, and patient consented. Anesthesia Topical anesthesia was used. Anesthetic medications included Lidocaine 2%, Proparacaine 0.5%. Procedure Preparation included 5% betadine to ocular surface, eyelid speculum. A supplied (32g)  needle was used. Injection: 1.25 mg Bevacizumab  1.25mg /0.106ml   Route: Intravitreal, Site: Left Eye   NDC: [id number], Lot: [id number], Expiration date: 02/04/2024 Post-op Post injection exam found visual acuity of at least counting fingers. The patient tolerated the procedure well. There were no complications. The patient received written and verbal post procedure care education.   OCT, Retina - OU - Both Eyes Result Date: 01/04/2024 Right Eye Quality was borderline. Central Foveal Thickness: 306. Progression has been stable. Findings include normal foveal contour, no SRF, retinal drusen , intraretinal fluid, outer retinal atrophy (Focal ORA temporal macula, no fluid or edema, trace cystic changes vs schisis nasal mac). Left Eye Quality was borderline. Central Foveal Thickness: 300. Progression has been stable. Findings include no IRF, no SRF, abnormal foveal contour, retinal drusen , subretinal hyper-reflective material, pigment epithelial detachment (Trace, persistent SRHM / edema overlying low PED nasal fovea/macula). Notes *Images captured and stored on drive Diagnosis / Impression: OD: Focal ORA temporal macula; no fluid or edema; trace cystic changes vs schisis nasal mac OS: trace persistent SRHM / edema overlying low PED nasal fovea/macula Clinical management: See below Abbreviations: NFP - Normal foveal profile. CME - cystoid macular edema. PED - pigment epithelial detachment. IRF - intraretinal fluid. SRF - subretinal fluid. EZ - ellipsoid zone. ERM - epiretinal membrane. ORA - outer retinal atrophy. ORT - outer retinal tubulation. SRHM - subretinal hyper-reflective material. IRHM - intraretinal hyper-reflective material     The results of significant diagnostics from this hospitalization (including imaging, microbiology, ancillary and laboratory) are listed below for reference.     Microbiology: Recent Results (from the past 240 hours)  Culture, blood (Routine x 2)     Status: None (Preliminary  result)   Collection Time: 01/30/24  1:02 PM   Specimen: BLOOD RIGHT HAND  Result Value Ref Range Status   Specimen Description BLOOD RIGHT HAND  Final   Special Requests   Final    BOTTLES DRAWN AEROBIC AND ANAEROBIC Blood Culture results may not be optimal due to an inadequate volume of blood received in culture bottles   Culture   Final    NO GROWTH 4 DAYS Performed at [facility], [address] [place], [place] [address]    Report Status PENDING  Incomplete  Culture, blood (Routine x 2)     Status: None (Preliminary result)   Collection Time: 01/30/24  1:07 PM   Specimen: BLOOD  Result Value Ref Range Status   Specimen Description BLOOD LEFT ANTECUBITAL  Final   Special Requests   Final    BOTTLES DRAWN AEROBIC AND ANAEROBIC Blood Culture adequate volume   Culture   Final    NO GROWTH 4 DAYS Performed at [facility], [address] [place], [place] [address]    Report Status PENDING  Incomplete  Resp panel by RT-PCR (RSV, Flu A&B, Covid) Anterior Nasal Swab     Status: None   Collection Time: 01/30/24  1:49 PM   Specimen: Anterior Nasal Swab  Result Value Ref Range Status   SARS Coronavirus 2 by RT PCR NEGATIVE NEGATIVE Final   Influenza A by PCR NEGATIVE NEGATIVE Final   Influenza B by PCR NEGATIVE NEGATIVE Final    Comment: (NOTE) The Xpert Xpress SARS-CoV-2/FLU/RSV plus assay is intended as an aid in the diagnosis of influenza from Nasopharyngeal swab specimens and should not be used as a sole basis for treatment. Nasal washings and aspirates are unacceptable for Xpert Xpress SARS-CoV-2/FLU/RSV testing.  Fact Sheet for Patients: BloggerCourse.com  Fact Sheet for Healthcare Providers: SeriousBroker.it  This test is not yet approved or cleared by the [place]  FDA and has been authorized for detection and/or diagnosis of SARS-CoV-2 by FDA under an Emergency Use Authorization (EUA). This EUA will  remain in effect (meaning this test can be used) for the duration of the COVID-19 declaration under Section 564(b)(1) of the Act, 21 U.S.C. section 360bbb-3(b)(1), unless the authorization is terminated or revoked.     Resp Syncytial Virus by PCR NEGATIVE NEGATIVE Final    Comment: (NOTE) Fact Sheet for Patients: BloggerCourse.com  Fact Sheet for Healthcare Providers: SeriousBroker.it  This test is not yet approved or cleared by the [place]  FDA and has been authorized for detection and/or diagnosis of SARS-CoV-2 by FDA under an Emergency Use Authorization (EUA). This EUA will remain in effect (meaning this test can be used) for the duration of the COVID-19 declaration under Section 564(b)(1) of the Act, 21 U.S.C. section 360bbb-3(b)(1), unless the authorization is terminated or revoked.  Performed at [facility], [address], [place], [place] [address]      Labs: BNP (last 3 results) Recent Labs    02/01/24 0232  BNP 910.0*   Basic Metabolic Panel: Recent Labs  Lab 01/30/24 1305 01/31/24 0217 02/01/24 0232 02/02/24 0238  NA 135 137 135 137  K 4.2 3.9 3.6 3.5  CL 101 104 101 99  CO2 24 25 28 28   GLUCOSE 113* 99 183* 168*  BUN 19 14 15  24*  CREATININE 1.37* 1.03 0.95 0.95  CALCIUM  8.4* 7.8* 7.7* 7.8*  MG  --  2.1 2.2 2.2   Liver Function Tests: Recent Labs  Lab 01/30/24 1305 01/31/24 0217  AST 39 32  ALT 40 32  ALKPHOS 53 51  BILITOT 1.0 0.9  PROT 6.1* 5.0*  ALBUMIN  2.4* 2.0*   No results for input(s): LIPASE, AMYLASE in the last 168 hours. No results for input(s): AMMONIA in the last 168 hours. CBC: Recent Labs  Lab 01/30/24 1305 01/31/24 0217 02/01/24 0232 02/02/24 0238  WBC 11.3* 8.8 7.1 19.9*  NEUTROABS 7.2 6.1  --   --   HGB 10.0* 9.0* 9.8* 9.3*  HCT 31.4* 28.3* 31.4* 29.2*  MCV 82.2 82.0  81.6 81.8  PLT 397 299 344 376   Cardiac Enzymes: No results for input(s):  CKTOTAL, CKMB, CKMBINDEX, TROPONINI in the last 168 hours. BNP: Invalid input(s): POCBNP CBG: No results for input(s): GLUCAP in the last 168 hours. D-Dimer No results for input(s): DDIMER in the last 72 hours. Hgb A1c No results for input(s): HGBA1C in the last 72 hours. Lipid Profile No results for input(s): CHOL, HDL, LDLCALC, TRIG, CHOLHDL, LDLDIRECT in the last 72 hours. Thyroid  function studies No results for input(s): TSH, T4TOTAL, T3FREE, THYROIDAB in the last 72 hours.  Invalid input(s): FREET3 Anemia work up No results for input(s): VITAMINB12, FOLATE, FERRITIN, TIBC, IRON, RETICCTPCT in the last 72 hours. Urinalysis    Component Value Date/Time   COLORURINE YELLOW 01/23/2024 1145   APPEARANCEUR CLEAR 01/23/2024 1145   LABSPEC 1.015 01/23/2024 1145   PHURINE 5.5 01/23/2024 1145   GLUCOSEU NEGATIVE 01/23/2024 1145   HGBUR NEGATIVE 01/23/2024 1145   BILIRUBINUR NEGATIVE 01/23/2024 1145   KETONESUR NEGATIVE 01/23/2024 1145   PROTEINUR NEGATIVE 01/23/2024 1145   NITRITE NEGATIVE 01/23/2024 1145   LEUKOCYTESUR NEGATIVE 01/23/2024 1145   Sepsis Labs Recent Labs  Lab 01/30/24 1305 01/31/24 0217 02/01/24 0232 02/02/24 0238  WBC 11.3* 8.8 7.1 19.9*   Microbiology Recent Results (from the past 240 hours)  Culture, blood (Routine x 2)     Status: None (Preliminary result)   Collection Time: 01/30/24  1:02 PM   Specimen: BLOOD RIGHT HAND  Result Value Ref Range Status   Specimen Description BLOOD RIGHT HAND  Final   Special Requests   Final    BOTTLES DRAWN AEROBIC AND ANAEROBIC Blood Culture results may not be optimal due to an inadequate volume of blood received in culture bottles   Culture   Final    NO GROWTH 4 DAYS Performed at [facility], [address], [place], [place] [address]    Report Status PENDING  Incomplete  Culture, blood (Routine x 2)     Status: None (Preliminary result)   Collection  Time: 01/30/24  1:07 PM   Specimen: BLOOD  Result Value Ref Range Status   Specimen Description BLOOD LEFT ANTECUBITAL  Final   Special Requests   Final    BOTTLES DRAWN AEROBIC AND ANAEROBIC Blood Culture adequate volume   Culture   Final    NO GROWTH 4 DAYS Performed at [facility], [address], [place], [place] [address]    Report Status PENDING  Incomplete  Resp panel by RT-PCR (RSV, Flu A&B, Covid) Anterior Nasal Swab     Status: None   Collection Time: 01/30/24  1:49 PM   Specimen: Anterior Nasal Swab  Result Value Ref Range Status   SARS Coronavirus 2 by RT PCR NEGATIVE NEGATIVE Final   Influenza A by PCR NEGATIVE NEGATIVE Final   Influenza B by PCR NEGATIVE NEGATIVE Final    Comment: (NOTE) The Xpert Xpress SARS-CoV-2/FLU/RSV plus assay is intended as an aid in the diagnosis of influenza from Nasopharyngeal swab specimens and should not be used as a sole basis for treatment. Nasal washings and aspirates are unacceptable for Xpert Xpress SARS-CoV-2/FLU/RSV testing.  Fact Sheet for Patients: BloggerCourse.com  Fact Sheet for Healthcare Providers: SeriousBroker.it  This test is not yet approved or cleared by the [place]  FDA and has been authorized for detection and/or diagnosis of SARS-CoV-2 by FDA under an Emergency Use Authorization (EUA). This EUA will remain in effect (meaning this test can be used)  for the duration of the COVID-19 declaration under Section 564(b)(1) of the Act, 21 U.S.C. section 360bbb-3(b)(1), unless the authorization is terminated or revoked.     Resp Syncytial Virus by PCR NEGATIVE NEGATIVE Final    Comment: (NOTE) Fact Sheet for Patients: BloggerCourse.com  Fact Sheet for Healthcare Providers: SeriousBroker.it  This test is not yet approved or cleared by the [place]  FDA and has been authorized for detection and/or  diagnosis of SARS-CoV-2 by FDA under an Emergency Use Authorization (EUA). This EUA will remain in effect (meaning this test can be used) for the duration of the COVID-19 declaration under Section 564(b)(1) of the Act, 21 U.S.C. section 360bbb-3(b)(1), unless the authorization is terminated or revoked.  Performed at [facility], [address] [place], [place] [address]      Time coordinating discharge:  I have spent 35 minutes face to face with the patient and on the ward discussing the patients care, assessment, plan and disposition with other care givers. >50% of the time was devoted counseling the patient about the risks and benefits of treatment/Discharge disposition and coordinating care.   SIGNED:   Maggie Schooner, MD  [facility] 02/03/2024, 10:26 AM   If 7PM-7AM, please contact night-coverage

## 2024-02-03 NOTE — TOC Transition Note (Signed)
 Transition of Care [facility]) - Discharge Note   Patient Details  Name: Frank Meyer MRN: [id number] Date of Birth: Nov 25, 1940  [facility] CM/SW Contact:  Jennett Model, RN Phone Number: 02/03/2024, 10:19 AM   Clinical Narrative:    For dc home today with hospice, daughter will transport him home.  He has 10 liter concentrator at home and HOP order a another concentrator just in case he needs it , but per Cheri , everything is at the home.          Patient Goals and CMS Choice            Discharge Placement                       Discharge Plan and Services Additional resources added to the After Visit Summary for                                       Social Drivers of Health (SDOH) Interventions SDOH Screenings   Food Insecurity: No Food Insecurity (01/30/2024)  Housing: Low Risk  (01/30/2024)  Transportation Needs: No Transportation Needs (01/30/2024)  Utilities: Not At Risk (01/30/2024)  Depression (PHQ2-9): Low Risk  (01/08/2023)  Social Connections: Moderately Isolated (01/30/2024)  Tobacco Use: Medium Risk (01/30/2024)     Readmission Risk Interventions    10/30/2022   12:53 PM  Readmission Risk Prevention Plan  Transportation Screening Complete  PCP or Specialist Appt within 3-5 Days Complete  HRI or Home Care Consult Complete  Palliative Care Screening Not Applicable  Medication Review (RN Care Manager) Complete

## 2024-02-04 LAB — CULTURE, BLOOD (ROUTINE X 2)
Culture: NO GROWTH
Culture: NO GROWTH
Special Requests: ADEQUATE

## 2024-03-06 NOTE — Progress Notes (Shared)
 [facility] - Clinic Note  03/07/2024     CHIEF COMPLAINT Patient presents for No chief complaint on file.   HISTORY OF PRESENT ILLNESS: Frank Meyer is a 83 y.o. male who presents to the clinic today for:     Pt states he had cataract sx OD, there were no problems afterwards and his vision has improved  Referring physician: Shepard Ade, MD [address] [place],  [place] [address]  HISTORICAL INFORMATION:   Selected notes from the MEDICAL RECORD NUMBER [name] pt, transferring care due to insurance LEE: 09.12.23 -- IVA OU Ocular Hx- PMH-    CURRENT MEDICATIONS: No current outpatient medications on file. (Ophthalmic Drugs)   No current facility-administered medications for this visit. (Ophthalmic Drugs)   Current Outpatient Medications (Other)  Medication Sig   albuterol  (VENTOLIN  HFA) 108 (90 Base) MCG/ACT inhaler Inhale 1-2 puffs into the lungs every 4 (four) hours as needed for wheezing or shortness of breath.   ANORO ELLIPTA  62.5-25 MCG/ACT AEPB USE 1 INHALATION BY MOUTH ONCE  DAILY AT THE SAME TIME EACH DAY (Patient taking differently: Inhale 1 puff into the lungs daily.)   finasteride  (PROSCAR ) 5 MG tablet Take 5 mg by mouth at bedtime.   [MAR Hold] furosemide  (LASIX ) 40 MG tablet Take 1 tablet (40 mg total) by mouth daily as needed for fluid or edema.   ipratropium (ATROVENT ) 0.06 % nasal spray Place 1-2 sprays into both nostrils 4 (four) times daily as needed for rhinitis (runny nose). 2 sprays in the right nostril and 1 spray in the left nostril once daily   ipratropium-albuterol  (DUONEB) 0.5-2.5 (3) MG/3ML SOLN Take 3 mLs by nebulization every 6 (six) hours as needed. (Patient taking differently: Take 3 mLs by nebulization in the morning and at bedtime.)   levETIRAcetam  (KEPPRA ) 250 MG tablet TAKE 1 TABLET BY MOUTH IN THE  MORNING AND 2 TABLETS AT NIGHT (Patient taking differently: Take 250-500 mg by mouth 2 (two) times daily. Take  one tablet by mouth in the morning and take two tablets by mouth in the evening daily.)   Melatonin 10 MG TABS Take 10 mg by mouth at bedtime.   nitroGLYCERIN  (NITROSTAT ) 0.4 MG SL tablet Place 1 tablet (0.4 mg total) under the tongue every 5 (five) minutes as needed. Chest pain (Patient taking differently: Place 0.4 mg under the tongue every 5 (five) minutes as needed for chest pain. Chest pain)   OXYGEN Inhale 2-5 L/min into the lungs continuous. 5 lpm during the day and 2.5 lpm at night   pantoprazole  (PROTONIX ) 40 MG tablet Take 40 mg by mouth every morning.   polyethylene glycol (MIRALAX  / GLYCOLAX ) 17 g packet Take 17 g by mouth daily.   traZODone  (DESYREL ) 100 MG tablet Take 1 tablet (100 mg total) by mouth at bedtime.   No current facility-administered medications for this visit. (Other)   REVIEW OF SYSTEMS:      ALLERGIES No Known Allergies  PAST MEDICAL HISTORY Past Medical History:  Diagnosis Date   AKI (acute kidney injury) (HCC) 08/03/2022   Allergic rhinitis 10/24/2009   Altered bowel habits 10/04/2020   Basal cell carcinoma    Benign prostatic hyperplasia with lower urinary tract symptoms 10/24/2009   Benign prostatic hypertrophy    Bradycardia 12/06/2019   Callosity 10/16/2013   Cardiomegaly 01/23/2015   Chronic diastolic heart failure (HCC) 12/19/2018   Chronic respiratory failure (HCC) 10/04/2020   Coronary artery calcification seen on CAT scan 05/28/2020  Dyslipidemia    Dyspnea on exertion 03/18/2018   Elevated troponin 08/03/2022   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   Encounter for general adult medical examination without abnormal findings 01/17/2015   Epistaxis 09/18/2016   Ex-cigarette smoker 01/20/2021   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   Gallbladder problem    Gastroesophageal reflux disease    Hiatal hernia    Hilar adenopathy 03/25/2018   History of seizures  08/03/2022   History of smoking 03/18/2018   History of transient ischemic attack (TIA) 10/24/2009   Hyperlipidemia 10/24/2009   Insomnia 05/09/2013   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   Internal hemorrhoid 10/04/2020   Macular degeneration    Macular pucker, right eye 04/30/2020   Mediastinal adenopathy 03/25/2018   Memory loss 05/09/2013   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   Mild memory disturbance    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   Noninfective gastroenteritis and colitis, unspecified 10/04/2020   Nuclear sclerotic cataract of right eye 12/18/2019   Other specified symptoms and signs involving the digestive system and abdomen 10/04/2020   Overweight 09/14/2018   Oxygen dependent 10/04/2020   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   Personal history of colonic polyps 10/04/2020   Posterior vitreous detachment of right eye 12/18/2019   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   Pulmonary hypertension (HCC)    Right upper lobe pulmonary nodule 03/25/2018   Syncope and collapse 12/12/2018   TIA (transient ischemic attack)    Transient ischemic attack 10/24/2009   Vasomotor rhinitis 09/18/2016   Weight decreased 10/04/2020   Past Surgical History:  Procedure Laterality Date   CHOLECYSTECTOMY     none     FAMILY HISTORY Family History  Problem Relation Age of Onset   Obesity Mother    Memory loss Mother    SOCIAL HISTORY Social History   Tobacco Use   Smoking status: Former    Current packs/day: 0.00    Average packs/day: 2.0 packs/day for 25.0 years (50.0 ttl pk-yrs)    Types: Cigarettes    Start date: 74    Quit date: 1994    Years since quitting: 31.5   Smokeless tobacco: Never  Vaping Use   Vaping status: Never Used  Substance Use Topics   Alcohol use: Yes    Alcohol/week: 15.0 - 20.0 standard drinks of alcohol    Types: 15 - 20 Cans of beer per week   Drug  use: Never       OPHTHALMIC EXAM:  Not recorded    IMAGING AND PROCEDURES  Imaging and Procedures for 03/07/2024           ASSESSMENT/PLAN:  No diagnosis found.  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 Elner)   - s/p IVA OS #1 (01.22.24), #2 (03.27.24), #3 (05.22.24), #4 (07.24.24), #5 (09.18.24), #6 (11.13.24), #7 (01.07.25), #8 (03.10.25), #9 (05.20.25) **history of increased SRHM/edema and decreased VA OS at 9 wks on 07.24.24**  - BCVA OD decreased to 20/25 from 20/20; OS decreased to 20/40 from 20/30  - OCT shows OD: Focal ORA temporal macula, no fluid or edema trace cystic changes vs schisis nasal mac; OS: tr persistent SRHM / edema overlying low PED nasal fovea/macula at 10 weeks - recommend IVA OS #10 today, 07.22.25 with f/u  back to 8-9 weeks - recommend holding IVA OD again -- pt in agreement  - pt wishes proceed with injection OS  - RBA of procedure discussed, questions answered - informed consent obtained and signed 01.22.24 (OS) - see procedure note - Eylea approved for 2025 -- but [employer] funding unavailable  - f/u in 8-9 wks, sooner prn -- DFE/OCT, possible injection(s)  2. Pseudophakia OU  - s/p CE/IOL OU (OD: Dr. Octavia, 01.10.25)  - IOLs in good position, doing well  - monitor  Ophthalmic Meds Ordered this visit:  No orders of the defined types were placed in this encounter.    No follow-ups on file.  There are no Patient Instructions on file for this visit.   Explained the diagnoses, plan, and follow up with the patient and they expressed understanding.  Patient expressed understanding of the importance of proper follow up care.   This document serves as a record of services personally performed by Redell JUDITHANN Hans, MD, PhD. It was created on their behalf by Auston Muzzy, COMT. The creation of this record is  the provider's dictation and/or activities during the visit.  Electronically signed by: Auston Muzzy, COMT 03/06/24 2:01 PM    Redell JUDITHANN Hans, M.D., Ph.D. Diseases & Surgery of the Retina and Vitreous [facility]   Abbreviations: M myopia (nearsighted); A astigmatism; H hyperopia (farsighted); P presbyopia; Mrx spectacle prescription;  CTL contact lenses; OD right eye; OS left eye; OU both eyes  XT exotropia; ET esotropia; PEK punctate epithelial keratitis; PEE punctate epithelial erosions; DES dry eye syndrome; MGD meibomian gland dysfunction; ATs artificial tears; PFAT's preservative free artificial tears; NSC nuclear sclerotic cataract; PSC posterior subcapsular cataract; ERM epi-retinal membrane; PVD posterior vitreous detachment; RD retinal detachment; DM diabetes mellitus; DR diabetic retinopathy; NPDR non-proliferative diabetic retinopathy; PDR proliferative diabetic retinopathy; CSME clinically significant macular edema; DME diabetic macular edema; dbh dot blot hemorrhages; CWS cotton wool spot; POAG primary open angle glaucoma; C/D cup-to-disc ratio; HVF humphrey visual field; GVF goldmann visual field; OCT optical coherence tomography; IOP intraocular pressure; BRVO Branch retinal vein occlusion; CRVO central retinal vein occlusion; CRAO central retinal artery occlusion; BRAO branch retinal artery occlusion; RT retinal tear; SB scleral buckle; PPV pars plana vitrectomy; VH Vitreous hemorrhage; PRP panretinal laser photocoagulation; IVK intravitreal kenalog; VMT vitreomacular traction; MH Macular hole;  NVD neovascularization of the disc; NVE neovascularization elsewhere; AREDS age related eye disease study; ARMD age related macular degeneration; POAG primary open angle glaucoma; EBMD epithelial/anterior basement membrane dystrophy; ACIOL anterior chamber intraocular lens; IOL intraocular lens; PCIOL posterior chamber intraocular lens; Phaco/IOL phacoemulsification with  intraocular lens placement; PRK photorefractive keratectomy; LASIK laser assisted in situ keratomileusis; HTN hypertension; DM diabetes mellitus; COPD chronic obstructive pulmonary disease

## 2024-03-07 ENCOUNTER — Encounter (INDEPENDENT_AMBULATORY_CARE_PROVIDER_SITE_OTHER): Admitting: Ophthalmology

## 2024-03-09 ENCOUNTER — Other Ambulatory Visit: Payer: Self-pay | Admitting: Cardiology

## 2024-03-09 ENCOUNTER — Telehealth: Payer: Self-pay | Admitting: Neurology

## 2024-03-09 ENCOUNTER — Telehealth: Payer: Self-pay | Admitting: Cardiology

## 2024-03-13 NOTE — Telephone Encounter (Signed)
 Marked deceased as obituary was found/kbl 03/13/24

## 2024-03-14 NOTE — Telephone Encounter (Signed)
 Frank Meyer

## 2024-03-17 NOTE — Telephone Encounter (Signed)
 fyi

## 2024-03-17 NOTE — Telephone Encounter (Signed)
 Wife reports pt passed away this morning

## 2024-03-17 NOTE — Telephone Encounter (Signed)
 Wife Joesphine) called to report patient passed away this morning 03/19/24).

## 2024-03-17 DEATH — deceased

## 2024-03-23 ENCOUNTER — Ambulatory Visit: Admitting: Cardiology

## 2024-07-26 ENCOUNTER — Ambulatory Visit: Payer: Medicare Other | Admitting: Neurology
